# Patient Record
Sex: Female | Born: 1935
Health system: Southern US, Community
[De-identification: ages and names within clinical notes are randomized; demographics above are authoritative.]

## PROBLEM LIST (undated history)

## (undated) DIAGNOSIS — I341 Nonrheumatic mitral (valve) prolapse: Secondary | ICD-10-CM

## (undated) DIAGNOSIS — Z8639 Personal history of other endocrine, nutritional and metabolic disease: Secondary | ICD-10-CM

## (undated) DIAGNOSIS — Z8739 Personal history of other diseases of the musculoskeletal system and connective tissue: Secondary | ICD-10-CM

## (undated) DIAGNOSIS — M1990 Unspecified osteoarthritis, unspecified site: Secondary | ICD-10-CM

## (undated) DIAGNOSIS — J302 Other seasonal allergic rhinitis: Secondary | ICD-10-CM

## (undated) DIAGNOSIS — K5792 Diverticulitis of intestine, part unspecified, without perforation or abscess without bleeding: Secondary | ICD-10-CM

## (undated) DIAGNOSIS — R350 Frequency of micturition: Secondary | ICD-10-CM

## (undated) DIAGNOSIS — K219 Gastro-esophageal reflux disease without esophagitis: Secondary | ICD-10-CM

## (undated) DIAGNOSIS — B269 Mumps without complication: Secondary | ICD-10-CM

## (undated) DIAGNOSIS — M7989 Other specified soft tissue disorders: Secondary | ICD-10-CM

## (undated) DIAGNOSIS — N3941 Urge incontinence: Secondary | ICD-10-CM

## (undated) DIAGNOSIS — Z8619 Personal history of other infectious and parasitic diseases: Secondary | ICD-10-CM

## (undated) DIAGNOSIS — K5790 Diverticulosis of intestine, part unspecified, without perforation or abscess without bleeding: Secondary | ICD-10-CM

## (undated) DIAGNOSIS — N6002 Solitary cyst of left breast: Secondary | ICD-10-CM

## (undated) DIAGNOSIS — Z8679 Personal history of other diseases of the circulatory system: Secondary | ICD-10-CM

## (undated) DIAGNOSIS — D649 Anemia, unspecified: Secondary | ICD-10-CM

## (undated) DIAGNOSIS — IMO0002 Reserved for concepts with insufficient information to code with codable children: Secondary | ICD-10-CM

## (undated) DIAGNOSIS — H3530 Unspecified macular degeneration: Secondary | ICD-10-CM

## (undated) DIAGNOSIS — I82452 Acute embolism and thrombosis of left peroneal vein: Secondary | ICD-10-CM

## (undated) DIAGNOSIS — G459 Transient cerebral ischemic attack, unspecified: Secondary | ICD-10-CM

## (undated) DIAGNOSIS — R87619 Unspecified abnormal cytological findings in specimens from cervix uteri: Secondary | ICD-10-CM

## (undated) HISTORY — DX: Urge incontinence: N39.41

## (undated) HISTORY — DX: Unspecified abnormal cytological findings in specimens from cervix uteri: R87.619

## (undated) HISTORY — DX: Mumps without complication: B26.9

## (undated) HISTORY — DX: Gastro-esophageal reflux disease without esophagitis: K21.9

## (undated) HISTORY — DX: Reserved for concepts with insufficient information to code with codable children: IMO0002

## (undated) HISTORY — PX: ABDOMINAL HYSTERECTOMY: SHX81

## (undated) HISTORY — PX: COLONOSCOPY: SHX174

## (undated) HISTORY — DX: Transient cerebral ischemic attack, unspecified: G45.9

## (undated) HISTORY — DX: Personal history of other diseases of the circulatory system: Z86.79

## (undated) HISTORY — DX: Nonrheumatic mitral (valve) prolapse: I34.1

## (undated) HISTORY — DX: Personal history of other infectious and parasitic diseases: Z86.19

## (undated) HISTORY — DX: Frequency of micturition: R35.0

## (undated) HISTORY — DX: Personal history of other endocrine, nutritional and metabolic disease: Z86.39

## (undated) HISTORY — DX: Personal history of other diseases of the musculoskeletal system and connective tissue: Z87.39

---

## 1962-01-17 HISTORY — PX: BREAST CYST ASPIRATION: SHX578

## 1978-01-17 DIAGNOSIS — N6002 Solitary cyst of left breast: Secondary | ICD-10-CM

## 1978-01-17 HISTORY — DX: Solitary cyst of left breast: N60.02

## 1997-10-02 ENCOUNTER — Other Ambulatory Visit: Admission: RE | Admit: 1997-10-02 | Discharge: 1997-10-02 | Payer: Self-pay | Admitting: *Deleted

## 1998-03-09 ENCOUNTER — Ambulatory Visit (HOSPITAL_COMMUNITY): Admission: RE | Admit: 1998-03-09 | Discharge: 1998-03-09 | Payer: Self-pay | Admitting: Gastroenterology

## 1998-08-09 ENCOUNTER — Encounter: Payer: Self-pay | Admitting: Emergency Medicine

## 1998-08-09 ENCOUNTER — Emergency Department (HOSPITAL_COMMUNITY): Admission: EM | Admit: 1998-08-09 | Discharge: 1998-08-09 | Payer: Self-pay | Admitting: Emergency Medicine

## 1998-11-26 ENCOUNTER — Other Ambulatory Visit: Admission: RE | Admit: 1998-11-26 | Discharge: 1998-11-26 | Payer: Self-pay | Admitting: *Deleted

## 1999-10-04 ENCOUNTER — Other Ambulatory Visit: Admission: RE | Admit: 1999-10-04 | Discharge: 1999-10-04 | Payer: Self-pay | Admitting: Family Medicine

## 2000-01-04 ENCOUNTER — Other Ambulatory Visit: Admission: RE | Admit: 2000-01-04 | Discharge: 2000-01-04 | Payer: Self-pay | Admitting: *Deleted

## 2000-03-02 ENCOUNTER — Emergency Department (HOSPITAL_COMMUNITY): Admission: EM | Admit: 2000-03-02 | Discharge: 2000-03-02 | Payer: Self-pay | Admitting: Emergency Medicine

## 2000-03-02 ENCOUNTER — Encounter: Payer: Self-pay | Admitting: Emergency Medicine

## 2000-06-02 ENCOUNTER — Encounter: Admission: RE | Admit: 2000-06-02 | Discharge: 2000-06-02 | Payer: Self-pay | Admitting: Family Medicine

## 2000-06-02 ENCOUNTER — Encounter: Payer: Self-pay | Admitting: Family Medicine

## 2001-11-04 ENCOUNTER — Encounter: Payer: Self-pay | Admitting: Family Medicine

## 2001-11-04 ENCOUNTER — Emergency Department (HOSPITAL_COMMUNITY): Admission: EM | Admit: 2001-11-04 | Discharge: 2001-11-04 | Payer: Self-pay | Admitting: Emergency Medicine

## 2002-02-05 ENCOUNTER — Other Ambulatory Visit: Admission: RE | Admit: 2002-02-05 | Discharge: 2002-02-05 | Payer: Self-pay | Admitting: Obstetrics and Gynecology

## 2002-11-30 ENCOUNTER — Emergency Department (HOSPITAL_COMMUNITY): Admission: EM | Admit: 2002-11-30 | Discharge: 2002-11-30 | Payer: Self-pay | Admitting: Emergency Medicine

## 2003-01-02 ENCOUNTER — Encounter: Admission: RE | Admit: 2003-01-02 | Discharge: 2003-01-02 | Payer: Self-pay | Admitting: Family Medicine

## 2003-01-21 ENCOUNTER — Encounter: Admission: RE | Admit: 2003-01-21 | Discharge: 2003-01-21 | Payer: Self-pay | Admitting: Family Medicine

## 2003-02-09 ENCOUNTER — Emergency Department (HOSPITAL_COMMUNITY): Admission: EM | Admit: 2003-02-09 | Discharge: 2003-02-09 | Payer: Self-pay | Admitting: Emergency Medicine

## 2003-04-04 ENCOUNTER — Other Ambulatory Visit: Admission: RE | Admit: 2003-04-04 | Discharge: 2003-04-04 | Payer: Self-pay | Admitting: Obstetrics and Gynecology

## 2004-04-05 ENCOUNTER — Other Ambulatory Visit: Admission: RE | Admit: 2004-04-05 | Discharge: 2004-04-05 | Payer: Self-pay | Admitting: Obstetrics and Gynecology

## 2004-04-16 ENCOUNTER — Ambulatory Visit (HOSPITAL_COMMUNITY): Admission: RE | Admit: 2004-04-16 | Discharge: 2004-04-16 | Payer: Self-pay | Admitting: Obstetrics and Gynecology

## 2004-05-06 ENCOUNTER — Inpatient Hospital Stay (HOSPITAL_COMMUNITY): Admission: RE | Admit: 2004-05-06 | Discharge: 2004-05-08 | Payer: Self-pay | Admitting: Obstetrics and Gynecology

## 2004-09-06 ENCOUNTER — Encounter: Admission: RE | Admit: 2004-09-06 | Discharge: 2004-09-06 | Payer: Self-pay | Admitting: Orthopedic Surgery

## 2004-09-08 ENCOUNTER — Ambulatory Visit (HOSPITAL_COMMUNITY): Admission: RE | Admit: 2004-09-08 | Discharge: 2004-09-08 | Payer: Self-pay | Admitting: Orthopedic Surgery

## 2004-09-08 ENCOUNTER — Ambulatory Visit (HOSPITAL_BASED_OUTPATIENT_CLINIC_OR_DEPARTMENT_OTHER): Admission: RE | Admit: 2004-09-08 | Discharge: 2004-09-08 | Payer: Self-pay | Admitting: Orthopedic Surgery

## 2006-10-06 ENCOUNTER — Emergency Department (HOSPITAL_COMMUNITY): Admission: EM | Admit: 2006-10-06 | Discharge: 2006-10-07 | Payer: Self-pay | Admitting: Emergency Medicine

## 2007-02-01 ENCOUNTER — Emergency Department (HOSPITAL_COMMUNITY): Admission: EM | Admit: 2007-02-01 | Discharge: 2007-02-01 | Payer: Self-pay | Admitting: Emergency Medicine

## 2007-05-22 ENCOUNTER — Encounter: Admission: RE | Admit: 2007-05-22 | Discharge: 2007-05-22 | Payer: Self-pay | Admitting: Family Medicine

## 2007-07-30 ENCOUNTER — Emergency Department (HOSPITAL_COMMUNITY): Admission: EM | Admit: 2007-07-30 | Discharge: 2007-07-30 | Payer: Self-pay | Admitting: Emergency Medicine

## 2008-01-18 DIAGNOSIS — R350 Frequency of micturition: Secondary | ICD-10-CM

## 2008-01-18 HISTORY — DX: Frequency of micturition: R35.0

## 2008-05-06 ENCOUNTER — Emergency Department (HOSPITAL_COMMUNITY): Admission: EM | Admit: 2008-05-06 | Discharge: 2008-05-06 | Payer: Self-pay | Admitting: Emergency Medicine

## 2008-06-02 ENCOUNTER — Encounter: Admission: RE | Admit: 2008-06-02 | Discharge: 2008-06-02 | Payer: Self-pay | Admitting: Family Medicine

## 2008-11-18 ENCOUNTER — Encounter: Admission: RE | Admit: 2008-11-18 | Discharge: 2008-11-18 | Payer: Self-pay | Admitting: Family Medicine

## 2009-09-22 ENCOUNTER — Encounter: Admission: RE | Admit: 2009-09-22 | Discharge: 2009-09-22 | Payer: Self-pay | Admitting: Obstetrics and Gynecology

## 2009-09-25 ENCOUNTER — Emergency Department (HOSPITAL_COMMUNITY): Admission: EM | Admit: 2009-09-25 | Discharge: 2009-09-26 | Payer: Self-pay | Admitting: Emergency Medicine

## 2010-01-17 DIAGNOSIS — IMO0002 Reserved for concepts with insufficient information to code with codable children: Secondary | ICD-10-CM

## 2010-01-17 DIAGNOSIS — N3941 Urge incontinence: Secondary | ICD-10-CM

## 2010-01-17 HISTORY — DX: Urge incontinence: N39.41

## 2010-01-17 HISTORY — DX: Reserved for concepts with insufficient information to code with codable children: IMO0002

## 2010-04-01 LAB — CBC
HCT: 34.8 % — ABNORMAL LOW (ref 36.0–46.0)
Hemoglobin: 12.4 g/dL (ref 12.0–15.0)
MCH: 31.4 pg (ref 26.0–34.0)
MCHC: 35.7 g/dL (ref 30.0–36.0)
MCV: 87.9 fL (ref 78.0–100.0)
Platelets: 197 10*3/uL (ref 150–400)
RBC: 3.96 MIL/uL (ref 3.87–5.11)
RDW: 13.6 % (ref 11.5–15.5)
WBC: 6.9 10*3/uL (ref 4.0–10.5)

## 2010-04-01 LAB — DIFFERENTIAL
Basophils Absolute: 0 10*3/uL (ref 0.0–0.1)
Basophils Relative: 0 % (ref 0–1)
Eosinophils Absolute: 0.2 10*3/uL (ref 0.0–0.7)
Eosinophils Relative: 4 % (ref 0–5)
Lymphocytes Relative: 40 % (ref 12–46)
Lymphs Abs: 2.8 10*3/uL (ref 0.7–4.0)
Monocytes Absolute: 0.7 10*3/uL (ref 0.1–1.0)
Monocytes Relative: 11 % (ref 3–12)
Neutro Abs: 3.2 10*3/uL (ref 1.7–7.7)
Neutrophils Relative %: 46 % (ref 43–77)

## 2010-04-21 ENCOUNTER — Emergency Department (HOSPITAL_COMMUNITY): Payer: Medicare Other

## 2010-04-21 ENCOUNTER — Emergency Department (HOSPITAL_COMMUNITY)
Admission: EM | Admit: 2010-04-21 | Discharge: 2010-04-21 | Disposition: A | Discharge status: Home / Self Care | Payer: Medicare Other | Attending: Emergency Medicine | Admitting: Emergency Medicine

## 2010-04-21 DIAGNOSIS — J40 Bronchitis, not specified as acute or chronic: Secondary | ICD-10-CM | POA: Insufficient documentation

## 2010-04-21 DIAGNOSIS — R0609 Other forms of dyspnea: Secondary | ICD-10-CM | POA: Insufficient documentation

## 2010-04-21 DIAGNOSIS — R0602 Shortness of breath: Secondary | ICD-10-CM | POA: Insufficient documentation

## 2010-04-21 DIAGNOSIS — R079 Chest pain, unspecified: Secondary | ICD-10-CM | POA: Insufficient documentation

## 2010-04-21 DIAGNOSIS — F411 Generalized anxiety disorder: Secondary | ICD-10-CM | POA: Insufficient documentation

## 2010-04-21 DIAGNOSIS — R0989 Other specified symptoms and signs involving the circulatory and respiratory systems: Secondary | ICD-10-CM | POA: Insufficient documentation

## 2010-04-21 LAB — BASIC METABOLIC PANEL
BUN: 22 mg/dL (ref 6–23)
CO2: 24 mEq/L (ref 19–32)
Calcium: 9.1 mg/dL (ref 8.4–10.5)
Chloride: 105 mEq/L (ref 96–112)
Creatinine, Ser: 1.08 mg/dL (ref 0.4–1.2)
GFR calc Af Amer: 59 mL/min — ABNORMAL LOW (ref >60)
GFR calc non Af Amer: 49 mL/min — ABNORMAL LOW (ref >60)
Glucose, Bld: 118 mg/dL — ABNORMAL HIGH (ref 70–99)
Potassium: 4.3 mEq/L (ref 3.5–5.1)
Sodium: 136 mEq/L (ref 135–145)

## 2010-04-21 LAB — DIFFERENTIAL
Basophils Absolute: 0 10*3/uL (ref 0.0–0.1)
Basophils Relative: 0 % (ref 0–1)
Eosinophils Absolute: 0.1 10*3/uL (ref 0.0–0.7)
Eosinophils Relative: 1 % (ref 0–5)
Lymphocytes Relative: 58 % — ABNORMAL HIGH (ref 12–46)
Lymphs Abs: 2.9 10*3/uL (ref 0.7–4.0)
Monocytes Absolute: 0.6 10*3/uL (ref 0.1–1.0)
Monocytes Relative: 12 % (ref 3–12)
Neutro Abs: 1.4 10*3/uL — ABNORMAL LOW (ref 1.7–7.7)
Neutrophils Relative %: 29 % — ABNORMAL LOW (ref 43–77)

## 2010-04-21 LAB — POCT CARDIAC MARKERS
CKMB, poc: <1 ng/mL — ABNORMAL LOW (ref 1.0–8.0)
Myoglobin, poc: 220 ng/mL (ref 12–200)
Troponin i, poc: <0.05 ng/mL (ref 0.00–0.09)

## 2010-04-21 LAB — CBC
HCT: 35 % — ABNORMAL LOW (ref 36.0–46.0)
Hemoglobin: 12.4 g/dL (ref 12.0–15.0)
MCH: 29.5 pg (ref 26.0–34.0)
MCHC: 35.4 g/dL (ref 30.0–36.0)
MCV: 83.3 fL (ref 78.0–100.0)
Platelets: 168 10*3/uL (ref 150–400)
RBC: 4.2 MIL/uL (ref 3.87–5.11)
RDW: 13.3 % (ref 11.5–15.5)
WBC: 5 10*3/uL (ref 4.0–10.5)

## 2010-04-21 LAB — D-DIMER, QUANTITATIVE (NOT AT ARMC): D-Dimer, Quant: 0.33 ug/mL-FEU (ref 0.00–0.48)

## 2010-05-03 ENCOUNTER — Encounter: Payer: Self-pay | Admitting: Pulmonary Disease

## 2010-05-04 ENCOUNTER — Ambulatory Visit: Payer: BC Managed Care – PPO | Admitting: Pulmonary Disease

## 2010-05-05 ENCOUNTER — Institutional Professional Consult (permissible substitution): Payer: BC Managed Care – PPO | Admitting: Internal Medicine

## 2010-06-04 NOTE — Op Note (Signed)
[name], [name]                 ACCOUNT NO.:  [id number]   MEDICAL RECORD NO.:  [id number]          PATIENT TYPE:  AMB   LOCATION:  DSC                          FACILITY:  [facility]   PHYSICIAN:  Mila Homer. Sherlean Foot, M.D. DATE OF BIRTH:  1927-07-05   DATE OF PROCEDURE:  09/08/2004  DATE OF DISCHARGE:                                 OPERATIVE REPORT   SURGEON:  Lucey.   ASSISTANT:  Rexene Edison, P.A.   ANESTHESIA:  General.   PREOPERATIVE DIAGNOSES:  1.  Right shoulder rotator cuff tear and impingement syndrome.  2.  Acromioclavicular joint arthritis.   POSTOPERATIVE DIAGNOSES:  1.  Right shoulder rotator cuff tear and impingement syndrome.  2.  Acromioclavicular joint arthritis.   PROCEDURE:  1.  Right mini open rotator cuff repair.  2.  Subacromial decompression.  3.  Shoulder arthroscopy with debridement of labral tearing and distal      clavicle resection.   Informed consent was obtained.   DESCRIPTION OF PROCEDURE:  Patient was laid supine, administered general  anesthesia after preoperative interscalene block.  The patient was then  placed in the beach chair position, the right shoulder was prepped and  draped in the usual sterile fashion.  Anterior, posterior and direct caudal  portals were created with a #11 blade, blunt trocar and cannula.  Diagnostic  arthroscopy revealed degenerative labral tearing and undersurface rotator  cuff tear.  A small 35 gator shaver was used for the anterior portal to  perform a debridement of the labrum.  There was minimal chondromalacia in  the joint and the biceps tendon looked good.  I then debrided the  undersurface of the rotator cuff.  I then went from the posterior portal  into the subacromial space.  From the direct lateral portal, I performed a  bursectomy with the 35 gator shaver.  I then released the CA ligament with a  90 degree ArthroCare debridement wand.  I completed the bursectomy and  cleaned off the undersurface of the  acromion.  There was definitely a type 3  acromion a large __________ [facility] joint undersurface creating impingement.  I  used the 4.0 mm cylindrical bur to perform an aggressive anterolateral  acromioplasty as well as a distal clavicle resection.  I then converted to a  mini open technique.  I extended my lateral incision up to 2 cm in length.  I increased the length of the portal through the muscle, in line with the  raphe, and held it in place with the Arthrex shoulder retractor.  I then  used the 4.0 mm cylindrical bur to create a bony bleeding bed in the  undersurface of the tear and placed a single 5.5 mm bicortical screw from  Arthrex.  I then placed a modified Mason-Allen suture to bring this back  together.  It came together very, very nicely.  I then irrigated and closed  with interrupted 0 and 2-0 Vicryl sutures, dressed with Adaptic, 4x4s, ABDs,  2 inch silk tape and a sling and swathe.   COMPLICATIONS:  None.   DRAINS:  None.  ______________________________  Mila Homer Sherlean Foot, M.D.     SDL/MEDQ  D:  09/08/2004  T:  09/08/2004  Job:  [id number]

## 2010-06-04 NOTE — H&P (Signed)
[name], Thornton                 ACCOUNT NO.:  [id number]   MEDICAL RECORD NO.:  [id number]          PATIENT TYPE:  INP   LOCATION:  NA                            [facility]AC[facility]  [facility]   PHYSICIAN:  Osborn Coho, M.D.   DATE OF BIRTH:  1927/02/05   DATE OF ADMISSION:  05/06/2004  DATE OF DISCHARGE:                                HISTORY & PHYSICAL   CHIEF COMPLAINT:  Bulge in vagina.   HISTORY OF PRESENT ILLNESS:  Ms. [name] is a 77 year old postmenopausal  female, status post hysterectomy in 1977 with vaginal wall prolapse x10 to  20 years with mixed urinary incontinence predominantly urge incontinence.  The patient would like surgical management of her vaginal wall prolapse with  an anterior and posterior repair.  She declined a TVT secondary to minor  stress incontinence symptoms.  The risks, benefits, and alternatives of an  anterior and posterior repair discussed with the patient including, but not  limited to bleeding, infection, injury.  The patient verbalized  understanding, questions answered, and consent signed and witnessed the day  of the procedure.   PAST OBSTETRICAL HISTORY:  Normal spontaneous vaginal delivery full term x3,  no problems.   PAST GYN HISTORY:  Status post hysterectomy in 1977, no history of abnormal  Pap smears.  Last mammogram in February of 2006 which was negative.  Denies  a history of gonorrhea and Chlamydia.  Reports a colonoscopy in 2004 which  was negative.   PAST SURGICAL HISTORY:  Hysterectomy as mentioned above, breast cystectomy  and anterior repair 10 to 15 years ago.   PAST MEDICAL HISTORY:  Denies hypertension, diabetes.  Reports a history of  GERD and hiatal hernia.   MEDICATIONS:  1.  Levbid.  2.  Nexium.   ALLERGIES:  No known drug allergies.   SOCIAL HISTORY:  Denies cigarette use, alcohol use, and drug use.  She lives  with her husband.   FAMILY HISTORY:  Mother with uterine cancer and no other GYN cancers.  Diabetes in an  aunt.  Father died of unknown cause.  Brother with heart  problems and a CABG in another brother.   REVIEW OF SYSTEMS:  Denies fevers, chills, nausea, vomiting, diarrhea.  She  denies chest pain or shortness of breath with activity.  Reports the history  of hiatal hernia and reports walking 2 miles everyday.   PHYSICAL EXAMINATION:  VITAL SIGNS:  Blood pressure at last visit in office  130/70.  HEENT:  Within normal limits.  Thyroid not enlarged.  HEART:  Rate and rhythm are regular.  CHEST:  Clear to auscultation bilaterally.  BREASTS:  No masses, discharge, skin changes, or nipple retraction.  BACK:  No CVA tenderness.  ABDOMEN:  Soft and nontender.  EXTREMITIES:  Within normal limits.  PELVIC:  External genitalia within normal limits.  Vagina with stage 3  cystocele and stage 4 rectocele.  The patient is status post hysterectomy.  No palpable adnexal masses or rectal masses.   ASSESSMENT:  Symptomatic cystocele for surgical management with anterior and  posterior repair scheduled for May 06, 2004.  The risks, benefits, and  alternatives have been discussed with the patient as mentioned above.  Preoperative labs to be done prior to surgery.      AR/MEDQ  D:  05/06/2004  T:  05/06/2004  Job:  [id number]

## 2010-06-04 NOTE — Discharge Summary (Signed)
NAMEBRAIDEN, [name]                 ACCOUNT NO.:  [id number]   MEDICAL RECORD NO.:  [id number]          PATIENT TYPE:  INP   LOCATION:  9320                          FACILITY:  [facility]   PHYSICIAN:  Osborn Coho, M.D.   DATE OF BIRTH:  [birth year]   DATE OF ADMISSION:  05/06/2004  DATE OF DISCHARGE:  05/08/2004                                 DISCHARGE SUMMARY   DISCHARGE DIAGNOSES:  1.  Symptomatic pelvic relaxation.  2.  Cystocele.  3.  Rectocele.   OPERATION:  On the date of admission, the patient underwent an anterior and  posterior colporrhaphy, tolerating the procedure well.   HISTORY OF PRESENT ILLNESS:  [name] is a 77 year old postmenopausal  female who is status post hysterectomy who presents for an anterior with  posterior repair because of vaginal wall prolapse and symptoms of mixed  urinary incontinence. Please see the patient's dictated History and Physical  Examination for details.   PREOPERATIVE PHYSICAL EXAMINATION:  VITAL SIGNS:  Blood pressure 130/70.  GENERAL:  Within normal limits.  PELVIC:  External genitalia within normal limits. Vagina with stage 3  cystocele and stage 4 rectocele (the patient is status post hysterectomy).  There are no palpable masses or rectal masses.   HOSPITAL COURSE:  On the date of admission the patient underwent  aforementioned procedure, tolerating it well. Postoperative course was  marked by the patient experiencing a fever of 101.2 degrees Fahrenheit  orally; however, she quickly defervesced and maintained a normal temperature  for 24 hours prior to her discharge. On postoperative day #2, the patient  had resumed bowel and bladder function and was therefore deemed ready for  discharge home. Discharge hemoglobin 10.4 (preoperative hemoglobin was  12.5).   DISCHARGE MEDICATIONS:  1.  Colace 100 mg one tablet two to three times daily for 6 weeks.  2.  Motrin 600 mg one tablet q.6h. as needed for pain.  3.  Tylenol No. 3 one to  two tablets q.4-6h. as needed for breakthrough      pain.   The patient was also advised to resume her prehospital medications:  1.  Levbid 0.375 mg one tablet daily.  2.  Nexium 40 mg daily.  3.  Calcium 600 mg daily.   FOLLOW-UP:  The patient is scheduled for a follow-up visit with Dr. Su Hilt  on Jun 16, 2004 at 10:30 a.m.   DISCHARGE INSTRUCTIONS:  The patient was given a copy of [facility] postoperative instruction sheet. She was further advised to call her  doctor with any temperature greater than or equal to 100.4, fever, chills,  vomiting, vaginal bleeding, or abdominal pain. The patient was further  advised to not place anything in her vagina for 6 weeks. The patient's diet  was without restriction.      EJP/MEDQ  D:  05/10/2004  T:  05/11/2004  Job:  [id number]

## 2010-06-04 NOTE — Op Note (Signed)
NAMEELENE, DOWNUM                 ACCOUNT NO.:  [id number]   MEDICAL RECORD NO.:  [id number]          PATIENT TYPE:  INP   LOCATION:  9320                          FACILITY:  [facility]   PHYSICIAN:  Osborn Coho, M.D.   DATE OF BIRTH:  [birth year]   DATE OF PROCEDURE:  05/06/2004  DATE OF DISCHARGE:  05/08/2004                                 OPERATIVE REPORT   PREOPERATIVE DIAGNOSIS:  Symptomatic cystocele and rectocele.   POSTOPERATIVE DIAGNOSIS:  Symptomatic cystocele and rectocele.   PROCEDURE:  Anterior and posterior repair.   ATTENDING:  Osborn Coho, M.D.   ASSISTANT:  Marquis Lunch. Adline Peals.   ANESTHESIA:  General.   No specimens to pathology.   FLUIDS:  1500 mL.   URINE OUTPUT:  400 mL.   ESTIMATED BLOOD LOSS:  Minimal.   COMPLICATIONS:  None.   PROCEDURE:  The patient was taken to the operating room after the risks,  benefits, and alternatives were discussed with the patient, the patient  verbalized understanding and consent signed and witnessed.  The patient was  placed under a general per anesthesia and prepped and draped in the usual  sterile fashion in the dorsal lithotomy position.  Vaginal wall retractors  were used for visualization of the rectocele.  Dilute Pitressin was  administered in the posterior vaginal wall.  The vaginal mucosa was  undermined and dissected away from the underlying fascia.  The rectocele was  repaired with 0 Vicryl via plication stitches, allowing reinforcement of the  underlying fascia.  The excess vaginal mucosa was excised and the posterior  vaginal wall repaired using interrupteds of 2-0 Vicryl.  Attention was then  turned to the cystocele and dilute Pitressin was administered.  The anterior  vaginal wall was then incised and the vaginal mucosa undermined from the  underlying tissue.  The cystocele was repaired with 2-0 chromic using a  pursestring stitch.  The vaginal wall was then repaired with 2-0 Vicryl via  interrupteds.   At the time  of repairing the rectocele, a small enterocele was noted and repaired with a  pursestring stitch of 2-0 chromic.  Estrogen-soaked one-inch plain vaginal  packing was placed.  Sponge, lap and needle count was correct.  The patient  tolerated the procedure well and was returned to the recovery room in good  condition.      AR/MEDQ  D:  05/17/2004  T:  05/17/2004  Job:  [id number]

## 2010-09-29 ENCOUNTER — Emergency Department (HOSPITAL_COMMUNITY): Payer: Medicare Other

## 2010-09-29 ENCOUNTER — Other Ambulatory Visit (HOSPITAL_COMMUNITY): Payer: BC Managed Care – PPO

## 2010-09-29 ENCOUNTER — Inpatient Hospital Stay (HOSPITAL_COMMUNITY)
Admission: EM | Admit: 2010-09-29 | Discharge: 2010-10-01 | DRG: 204 | Disposition: A | Discharge status: Home / Self Care | Payer: Medicare Other | Attending: Internal Medicine | Admitting: Internal Medicine

## 2010-09-29 DIAGNOSIS — R0789 Other chest pain: Secondary | ICD-10-CM | POA: Diagnosis present

## 2010-09-29 DIAGNOSIS — R002 Palpitations: Secondary | ICD-10-CM | POA: Diagnosis present

## 2010-09-29 DIAGNOSIS — Z79899 Other long term (current) drug therapy: Secondary | ICD-10-CM

## 2010-09-29 DIAGNOSIS — R609 Edema, unspecified: Secondary | ICD-10-CM | POA: Diagnosis present

## 2010-09-29 DIAGNOSIS — Z91041 Radiographic dye allergy status: Secondary | ICD-10-CM

## 2010-09-29 DIAGNOSIS — R0602 Shortness of breath: Principal | ICD-10-CM | POA: Diagnosis present

## 2010-09-29 LAB — D-DIMER, QUANTITATIVE (NOT AT ARMC): D-Dimer, Quant: <0.22 ug/mL-FEU (ref 0.00–0.48)

## 2010-09-29 LAB — BASIC METABOLIC PANEL
BUN: 17 mg/dL (ref 6–23)
CO2: 21 mEq/L (ref 19–32)
Calcium: 10.2 mg/dL (ref 8.4–10.5)
Chloride: 104 mEq/L (ref 96–112)
Creatinine, Ser: 0.89 mg/dL (ref 0.50–1.10)
GFR calc Af Amer: >60 mL/min (ref >60)
GFR calc non Af Amer: >60 mL/min (ref >60)
Glucose, Bld: 189 mg/dL — ABNORMAL HIGH (ref 70–99)
Potassium: 3.7 mEq/L (ref 3.5–5.1)
Sodium: 139 mEq/L (ref 135–145)

## 2010-09-29 LAB — CBC
HCT: 35.5 % — ABNORMAL LOW (ref 36.0–46.0)
Hemoglobin: 12.8 g/dL (ref 12.0–15.0)
MCH: 29.8 pg (ref 26.0–34.0)
MCHC: 36.1 g/dL — ABNORMAL HIGH (ref 30.0–36.0)
MCV: 82.6 fL (ref 78.0–100.0)
Platelets: 196 10*3/uL (ref 150–400)
RBC: 4.3 MIL/uL (ref 3.87–5.11)
RDW: 13 % (ref 11.5–15.5)
WBC: 6.5 10*3/uL (ref 4.0–10.5)

## 2010-09-29 LAB — CARDIAC PANEL(CRET KIN+CKTOT+MB+TROPI)
CK, MB: 2.5 ng/mL (ref 0.3–4.0)
Relative Index: INVALID (ref 0.0–2.5)
Total CK: 78 U/L (ref 7–177)
Troponin I: <0.3 ng/mL (ref <0.30)

## 2010-09-29 LAB — HEPATIC FUNCTION PANEL
ALT: 12 U/L (ref 0–35)
AST: 16 U/L (ref 0–37)
Albumin: 3.8 g/dL (ref 3.5–5.2)
Alkaline Phosphatase: 60 U/L (ref 39–117)
Bilirubin, Direct: 0.1 mg/dL (ref 0.0–0.3)
Indirect Bilirubin: 0.3 mg/dL (ref 0.3–0.9)
Total Bilirubin: 0.4 mg/dL (ref 0.3–1.2)
Total Protein: 7.3 g/dL (ref 6.0–8.3)

## 2010-09-29 LAB — POCT I-STAT TROPONIN I
Troponin i, poc: 0.01 ng/mL (ref 0.00–0.08)
Troponin i, poc: 0.03 ng/mL (ref 0.00–0.08)

## 2010-09-29 LAB — TSH: TSH: 1.356 u[IU]/mL (ref 0.350–4.500)

## 2010-09-29 LAB — VITAMIN B12: Vitamin B-12: 916 pg/mL — ABNORMAL HIGH (ref 211–911)

## 2010-09-29 LAB — CK TOTAL AND CKMB (NOT AT ARMC)
CK, MB: 2.8 ng/mL (ref 0.3–4.0)
Relative Index: INVALID (ref 0.0–2.5)
Total CK: 92 U/L (ref 7–177)

## 2010-09-29 LAB — PRO B NATRIURETIC PEPTIDE: Pro B Natriuretic peptide (BNP): 95.6 pg/mL (ref 0–450)

## 2010-09-29 MED ORDER — XENON XE 133 GAS
5.3000 | GAS_FOR_INHALATION | Freq: Once | RESPIRATORY_TRACT | Status: AC | PRN
  Administered 2010-09-29: 5 via RESPIRATORY_TRACT

## 2010-09-29 MED ORDER — TECHNETIUM TO 99M ALBUMIN AGGREGATED
7.4000 | Freq: Once | INTRAVENOUS | Status: AC | PRN
  Administered 2010-09-29: 7 via INTRAVENOUS

## 2010-09-30 DIAGNOSIS — R0602 Shortness of breath: Secondary | ICD-10-CM

## 2010-09-30 DIAGNOSIS — M7989 Other specified soft tissue disorders: Secondary | ICD-10-CM

## 2010-09-30 LAB — COMPREHENSIVE METABOLIC PANEL
ALT: 10 U/L (ref 0–35)
AST: 15 U/L (ref 0–37)
Albumin: 3.2 g/dL — ABNORMAL LOW (ref 3.5–5.2)
Alkaline Phosphatase: 49 U/L (ref 39–117)
BUN: 18 mg/dL (ref 6–23)
CO2: 28 mEq/L (ref 19–32)
Calcium: 8.9 mg/dL (ref 8.4–10.5)
Chloride: 107 mEq/L (ref 96–112)
Creatinine, Ser: 0.95 mg/dL (ref 0.50–1.10)
GFR calc Af Amer: >60 mL/min (ref >60)
GFR calc non Af Amer: 56 mL/min — ABNORMAL LOW (ref >60)
Glucose, Bld: 87 mg/dL (ref 70–99)
Potassium: 4.2 mEq/L (ref 3.5–5.1)
Sodium: 142 mEq/L (ref 135–145)
Total Bilirubin: 0.5 mg/dL (ref 0.3–1.2)
Total Protein: 6.2 g/dL (ref 6.0–8.3)

## 2010-09-30 LAB — CBC
HCT: 31.5 % — ABNORMAL LOW (ref 36.0–46.0)
Hemoglobin: 11.2 g/dL — ABNORMAL LOW (ref 12.0–15.0)
MCH: 29.8 pg (ref 26.0–34.0)
MCHC: 35.6 g/dL (ref 30.0–36.0)
MCV: 83.8 fL (ref 78.0–100.0)
Platelets: 162 10*3/uL (ref 150–400)
RBC: 3.76 MIL/uL — ABNORMAL LOW (ref 3.87–5.11)
RDW: 13.1 % (ref 11.5–15.5)
WBC: 7.3 10*3/uL (ref 4.0–10.5)

## 2010-09-30 LAB — URINALYSIS, ROUTINE W REFLEX MICROSCOPIC
Bilirubin Urine: NEGATIVE
Glucose, UA: NEGATIVE mg/dL
Hgb urine dipstick: NEGATIVE
Ketones, ur: NEGATIVE mg/dL
Leukocytes, UA: NEGATIVE
Nitrite: NEGATIVE
Protein, ur: NEGATIVE mg/dL
Specific Gravity, Urine: 1.013 (ref 1.005–1.030)
Urobilinogen, UA: 0.2 mg/dL (ref 0.0–1.0)
pH: 7.5 (ref 5.0–8.0)

## 2010-09-30 LAB — LIPID PANEL
Cholesterol: 170 mg/dL (ref 0–200)
HDL: 48 mg/dL (ref >39)
LDL Cholesterol: 102 mg/dL — ABNORMAL HIGH (ref 0–99)
Total CHOL/HDL Ratio: 3.5 RATIO
Triglycerides: 99 mg/dL (ref <150)
VLDL: 20 mg/dL (ref 0–40)

## 2010-09-30 LAB — CARDIAC PANEL(CRET KIN+CKTOT+MB+TROPI)
CK, MB: 2.4 ng/mL (ref 0.3–4.0)
CK, MB: 2.5 ng/mL (ref 0.3–4.0)
Relative Index: INVALID (ref 0.0–2.5)
Relative Index: INVALID (ref 0.0–2.5)
Total CK: 68 U/L (ref 7–177)
Total CK: 85 U/L (ref 7–177)
Troponin I: <0.3 ng/mL (ref <0.30)
Troponin I: <0.3 ng/mL (ref <0.30)

## 2010-10-01 NOTE — H&P (Signed)
[name], [name]                 ACCOUNT NO.:  [id number]  MEDICAL RECORD NO.:  [id number]  LOCATION:  WLED                         FACILITY:  [facility]  PHYSICIAN:  Marinda Elk, M.D.DATE OF BIRTH:  October 07, 1927  DATE OF ADMISSION:  09/29/2010 DATE OF DISCHARGE:                             HISTORY & PHYSICAL   PRIMARY CARE DOCTOR:  Dr. Chilton Si  CHIEF COMPLAINT:  Shortness of breath, palpitation, chest discomfort.  This is an 77 year old female with no significant past medical history who comes in for chest discomfort.  The patient relates that 2 weeks ago, she traveled to [place] literally by car and her leg was swollen at that time.  She did not have any symptoms at that time, so she came back driving here to [place], and was feeling okay.  Her leg got swelled up, but this resolved after a couple of days, she did not pay any attention.  She comes to the ED today because on the day of admission, she started having chest pressure this morning.  Felt some palpitations.  Felt like she was going to pass out, but never lost consciousness and started feeling short of breath.  She has some trouble getting up the stairs and she was significantly short of breath when she went and got her son.  So, they decided to bring her to the ED.  She relates that nothing makes it better or worse.  She denies any diarrhea, chills, nausea, or vomiting.  So, we were asked to admit and further evaluate.  ALLERGIES:  CONTRAST MEDIA, she gets a rash.  PAST MEDICAL HISTORY:  Only significant for cystocele and rectocele.  MEDICATIONS:  Only on Nexium.  SOCIAL HISTORY:  She lives here in [place].  Denies alcohol, tobacco, or drug and never has smoked.  FAMILY HISTORY:  She admits her father died in his sleep when he was 60. Her mother has uterine cancer.  No family history of clot disorder.  REVIEW OF SYSTEMS:  10-point review of system done, pertinent positive per HPI.  PHYSICAL EXAMINATION:   VITAL SIGNS:  Temperature 98, pulse 79, blood pressure 160/123, she was saturating 100% on room air, breathing 20 times per minute. GENERAL:  She is awake, alert, and oriented x3. HEENT:  Moist mucous membrane.  No JVD.  No bruits.  No thyromegaly. Anicteric.  No pallor. CARDIOVASCULAR:  She has regular rate and rhythm with positive S1 and S2.  No murmurs, rubs, or gallops. LUNGS:  Good air movement, clear to auscultation. ABDOMEN:  Positive bowel sounds, nontender, nondistended, soft. EXTREMITIES:  Positive pulses.  No clubbing.  No edema on the right, but the left leg looks a little bit slightly swollen, but no tenderness. Hoffman sign negative. SKIN:  She has no rashes or ulceration. NEURO:  Completely nonfocal.  LABORATORY DATA:  On admission show a D-dimer of less than 0.22.  Her proBNP was 95.  Her sodium was 139, potassium 3.7, chloride 104, bicarbonate 21, glucose of 189, BUN of 17, creatinine 0.9, calcium of 10.2.  Her white count of 6.5, hemoglobin of 12.8, platelet count of 196.  Her first set of cardiac enzymes negative x1.  Chest x-ray  shows no acute cardiopulmonary disease and her EKG shows sinus rhythm.  ASSESSMENT AND PLAN:  Chest pain, palpitation, and shortness of breath in an 77 year old with no risk factors.  We will admit to Telemetry to rule out any kind of arrhythmias.  We will cycle her cardiac enzymes x3. We will check a TSH, fasting lipid panel, B12, and a 2-D echo.  She has significant risk factors for pulmonary embolism.  Her leg was swollen when she traveled to [place] by car.  She is complaining of shortness of breath and palpitation that puts her at a moderate risk for pulmonary embolism, although her D-dimer was negative.  D-dimer is only useful when there is low probability for pulmonary embolism which rules out a pulmonary embolism, but as she is moderate to high risk, we will get a V/Q scan and Doppler ultrasound of her lower extremities to rule  out pulmonary embolism, deep vein thrombosis.  Also pericarditis and also in the differential, but history is not compatible.  Her EKG shows no changes and she has no pneumonia.  No dissection or widening mediastinum on chest x-ray.     Marinda Elk, M.D.     AF/MEDQ  D:  09/29/2010  T:  09/29/2010  Job:  [id number]  Electronically Signed by Marinda Elk M.D. on 10/01/2010 05:37:45 PM

## 2010-10-02 LAB — URINE CULTURE
Colony Count: 50000
Culture  Setup Time: 201209140012
Special Requests: NEGATIVE

## 2010-10-14 LAB — POCT I-STAT, CHEM 8
BUN: 13
Calcium, Ion: 1.23
Chloride: 109
Creatinine, Ser: 0.9
Glucose, Bld: 89
HCT: 39
Hemoglobin: 13.3
Potassium: 3.7
Sodium: 142
TCO2: 19

## 2010-10-14 LAB — OCCULT BLOOD X 1 CARD TO LAB, STOOL: Fecal Occult Bld: NEGATIVE

## 2010-10-16 NOTE — Discharge Summary (Signed)
[name], [name]                 ACCOUNT NO.:  [id number]  MEDICAL RECORD NO.:  [id number]  LOCATION:  1437                         FACILITY:  [facility]  PHYSICIAN:  Osvaldo Shipper, MD     DATE OF BIRTH:  04-May-1927  DATE OF ADMISSION:  09/29/2010 DATE OF DISCHARGE:  10/01/2010                              DISCHARGE SUMMARY   PRIMARY CARE PHYSICIAN:  Dr. Kirby Thornton.  No consultations obtained during this admission.  Imaging studies done include, 1. Chest x-ray, which showed no active lung disease. 2. V/Q scan, which showed very low probability of pulmonary embolism. 3. Doppler study of the lower extremities did not show any DVTs. 4. An echocardiogram was done, which showed EF of 60% to 65% with     normal wall motion and no wall motion abnormalities.  No     significant valvular disease was noted on this echocardiogram.  Pertinent laboratory data during this hospitalization include normal D- dimer, initial glucose of 189, subsequently was 87.  Cardiac enzymes are negative.  BNP was 95.  Rest of the labs were all unremarkable.  B12 was 916.  TSH was 1.35.  DISCHARGE DIAGNOSES: 1. Chest discomfort, shortness of breath, etiology unclear. 2. Lower extremity edema, resolved spontaneously.  No Deep venous     thrombosis. 3. Mild anemia, requiring outpatient followup.  BRIEF HOSPITAL COURSE:  Briefly this is an 77 year old African American female who was in good health who came in with complaints of shortness of breath, chest discomfort, and palpitations.  There was a recent road trip as well and her legs were swollen.  She underwent workup for thromboembolism and all that has come back negative.  Echocardiogram was unremarkable.  Cardiac enzymes were negative.  Symptoms have resolved. She has been ambulating in the hallway with no difficulties.  So her etiology is unclear.  It looks like the patient has been having these issues for a while.  She reported a stress test, which was  done about 6 months ago, which she reported being negative.  At this time, I recommended to the patient to follow up with her primary care physician within the next 1 to 2 weeks to discuss these issues further.  She otherwise, does not have any significant chronic medical problems.  On the day of discharge, the patient is feeling well, pleased about going home.  Denies any complaints.  Chest pain has resolved.  PHYSICAL EXAMINATION:  VITAL SIGNS:  All stable.  Blood pressure is 124/72. LUNGS:  Clear to auscultation. CARDIOVASCULAR SYSTEM:  S1 and S3 is normal and regular.  No S3, S4, rubs, murmurs, or bruit. ABDOMEN:  Soft, nontender, and nondistended.  Bowel sounds are present. No masses or organomegaly appreciated. EXTREMITIES:  No pedal edema is noted. NEUROLOGICAL:  She is alert.  No focal neurological deficits are present.  Essentially, the patient is stable for discharge.  Discharge medications include the following: 1. Advil 200 mg 2 tablets every 4 to 6 hours as needed for pain. 2. Centrum multivitamins 1 tablet daily.3. Citracal 1 tablet daily. 4. Eye vitamin 1 tablet daily. 5. Omega 3, 1 capsule daily. 6. Probiotic 1 capsule daily. 7. Vitamin D  1 tablet daily.  Follow up with Dr. Valentina Thornton, her PCP in 1 to 2 weeks.  DIET:  Regular diet.  Physical activity as tolerated, although I told the patient not to exert herself.  Total time on this discharge encountered 35 minutes.  Osvaldo Shipper, MD     GK/MEDQ  D:  10/01/2010  T:  10/01/2010  Job:  [id number]  cc:   [name], M.D. Fax: [phone]  Electronically Signed by Osvaldo Shipper MD on 10/16/2010 07:19:26 PM

## 2010-10-28 LAB — CBC
HCT: 38
Hemoglobin: 13
MCHC: 34.3
MCV: 87.5
Platelets: 229
RBC: 4.34
RDW: 13.7
WBC: 7.3

## 2010-10-28 LAB — DIFFERENTIAL
Basophils Absolute: 0
Basophils Relative: 1
Eosinophils Absolute: 0.1
Eosinophils Relative: 2
Lymphocytes Relative: 57 — ABNORMAL HIGH
Lymphs Abs: 4.2 — ABNORMAL HIGH
Monocytes Absolute: 0.6
Monocytes Relative: 8
Neutro Abs: 2.3
Neutrophils Relative %: 32 — ABNORMAL LOW

## 2010-10-28 LAB — SEDIMENTATION RATE: Sed Rate: 13

## 2011-01-25 DIAGNOSIS — M999 Biomechanical lesion, unspecified: Secondary | ICD-10-CM | POA: Diagnosis not present

## 2011-01-25 DIAGNOSIS — M5137 Other intervertebral disc degeneration, lumbosacral region: Secondary | ICD-10-CM | POA: Diagnosis not present

## 2011-01-31 DIAGNOSIS — M5137 Other intervertebral disc degeneration, lumbosacral region: Secondary | ICD-10-CM | POA: Diagnosis not present

## 2011-01-31 DIAGNOSIS — M999 Biomechanical lesion, unspecified: Secondary | ICD-10-CM | POA: Diagnosis not present

## 2011-02-03 DIAGNOSIS — M999 Biomechanical lesion, unspecified: Secondary | ICD-10-CM | POA: Diagnosis not present

## 2011-02-03 DIAGNOSIS — M5137 Other intervertebral disc degeneration, lumbosacral region: Secondary | ICD-10-CM | POA: Diagnosis not present

## 2011-02-15 DIAGNOSIS — M999 Biomechanical lesion, unspecified: Secondary | ICD-10-CM | POA: Diagnosis not present

## 2011-02-15 DIAGNOSIS — M5137 Other intervertebral disc degeneration, lumbosacral region: Secondary | ICD-10-CM | POA: Diagnosis not present

## 2011-02-23 DIAGNOSIS — M5137 Other intervertebral disc degeneration, lumbosacral region: Secondary | ICD-10-CM | POA: Diagnosis not present

## 2011-02-23 DIAGNOSIS — M999 Biomechanical lesion, unspecified: Secondary | ICD-10-CM | POA: Diagnosis not present

## 2011-02-24 DIAGNOSIS — M5137 Other intervertebral disc degeneration, lumbosacral region: Secondary | ICD-10-CM | POA: Diagnosis not present

## 2011-02-24 DIAGNOSIS — M999 Biomechanical lesion, unspecified: Secondary | ICD-10-CM | POA: Diagnosis not present

## 2011-02-25 DIAGNOSIS — M999 Biomechanical lesion, unspecified: Secondary | ICD-10-CM | POA: Diagnosis not present

## 2011-02-25 DIAGNOSIS — M5137 Other intervertebral disc degeneration, lumbosacral region: Secondary | ICD-10-CM | POA: Diagnosis not present

## 2011-03-03 DIAGNOSIS — M999 Biomechanical lesion, unspecified: Secondary | ICD-10-CM | POA: Diagnosis not present

## 2011-03-03 DIAGNOSIS — M5137 Other intervertebral disc degeneration, lumbosacral region: Secondary | ICD-10-CM | POA: Diagnosis not present

## 2011-03-08 DIAGNOSIS — M5137 Other intervertebral disc degeneration, lumbosacral region: Secondary | ICD-10-CM | POA: Diagnosis not present

## 2011-03-08 DIAGNOSIS — M999 Biomechanical lesion, unspecified: Secondary | ICD-10-CM | POA: Diagnosis not present

## 2011-03-09 DIAGNOSIS — M545 Low back pain, unspecified: Secondary | ICD-10-CM | POA: Diagnosis not present

## 2011-03-10 DIAGNOSIS — M5137 Other intervertebral disc degeneration, lumbosacral region: Secondary | ICD-10-CM | POA: Diagnosis not present

## 2011-03-10 DIAGNOSIS — M999 Biomechanical lesion, unspecified: Secondary | ICD-10-CM | POA: Diagnosis not present

## 2011-03-16 DIAGNOSIS — M5137 Other intervertebral disc degeneration, lumbosacral region: Secondary | ICD-10-CM | POA: Diagnosis not present

## 2011-03-17 DIAGNOSIS — M999 Biomechanical lesion, unspecified: Secondary | ICD-10-CM | POA: Diagnosis not present

## 2011-03-17 DIAGNOSIS — M545 Low back pain, unspecified: Secondary | ICD-10-CM | POA: Diagnosis not present

## 2011-03-17 DIAGNOSIS — M5137 Other intervertebral disc degeneration, lumbosacral region: Secondary | ICD-10-CM | POA: Diagnosis not present

## 2011-03-21 DIAGNOSIS — M47817 Spondylosis without myelopathy or radiculopathy, lumbosacral region: Secondary | ICD-10-CM | POA: Diagnosis not present

## 2011-03-24 DIAGNOSIS — M999 Biomechanical lesion, unspecified: Secondary | ICD-10-CM | POA: Diagnosis not present

## 2011-03-24 DIAGNOSIS — M5137 Other intervertebral disc degeneration, lumbosacral region: Secondary | ICD-10-CM | POA: Diagnosis not present

## 2011-04-18 DIAGNOSIS — M999 Biomechanical lesion, unspecified: Secondary | ICD-10-CM | POA: Diagnosis not present

## 2011-04-18 DIAGNOSIS — M5137 Other intervertebral disc degeneration, lumbosacral region: Secondary | ICD-10-CM | POA: Diagnosis not present

## 2011-05-31 DIAGNOSIS — R300 Dysuria: Secondary | ICD-10-CM | POA: Diagnosis not present

## 2011-06-01 DIAGNOSIS — H18519 Endothelial corneal dystrophy, unspecified eye: Secondary | ICD-10-CM | POA: Diagnosis not present

## 2011-06-01 DIAGNOSIS — H538 Other visual disturbances: Secondary | ICD-10-CM | POA: Diagnosis not present

## 2011-06-01 DIAGNOSIS — H2510 Age-related nuclear cataract, unspecified eye: Secondary | ICD-10-CM | POA: Diagnosis not present

## 2011-06-01 DIAGNOSIS — H40019 Open angle with borderline findings, low risk, unspecified eye: Secondary | ICD-10-CM | POA: Diagnosis not present

## 2011-06-01 DIAGNOSIS — H353190 Nonexudative age-related macular degeneration, unspecified eye, stage unspecified: Secondary | ICD-10-CM | POA: Diagnosis not present

## 2011-07-07 DIAGNOSIS — M5137 Other intervertebral disc degeneration, lumbosacral region: Secondary | ICD-10-CM | POA: Diagnosis not present

## 2011-07-07 DIAGNOSIS — M999 Biomechanical lesion, unspecified: Secondary | ICD-10-CM | POA: Diagnosis not present

## 2011-08-01 DIAGNOSIS — M5137 Other intervertebral disc degeneration, lumbosacral region: Secondary | ICD-10-CM | POA: Diagnosis not present

## 2011-08-01 DIAGNOSIS — M999 Biomechanical lesion, unspecified: Secondary | ICD-10-CM | POA: Diagnosis not present

## 2011-08-02 DIAGNOSIS — M5137 Other intervertebral disc degeneration, lumbosacral region: Secondary | ICD-10-CM | POA: Diagnosis not present

## 2011-08-02 DIAGNOSIS — M999 Biomechanical lesion, unspecified: Secondary | ICD-10-CM | POA: Diagnosis not present

## 2011-08-04 DIAGNOSIS — M5137 Other intervertebral disc degeneration, lumbosacral region: Secondary | ICD-10-CM | POA: Diagnosis not present

## 2011-08-04 DIAGNOSIS — M999 Biomechanical lesion, unspecified: Secondary | ICD-10-CM | POA: Diagnosis not present

## 2011-08-16 DIAGNOSIS — R51 Headache: Secondary | ICD-10-CM | POA: Diagnosis not present

## 2011-08-17 DIAGNOSIS — M531 Cervicobrachial syndrome: Secondary | ICD-10-CM | POA: Diagnosis not present

## 2011-08-19 DIAGNOSIS — G521 Disorders of glossopharyngeal nerve: Secondary | ICD-10-CM | POA: Diagnosis not present

## 2011-08-23 ENCOUNTER — Telehealth: Payer: Self-pay | Admitting: Obstetrics and Gynecology

## 2011-08-24 ENCOUNTER — Encounter: Payer: Self-pay | Admitting: Obstetrics and Gynecology

## 2011-08-24 ENCOUNTER — Ambulatory Visit (INDEPENDENT_AMBULATORY_CARE_PROVIDER_SITE_OTHER): Payer: Medicare Other | Admitting: Obstetrics and Gynecology

## 2011-08-24 ENCOUNTER — Ambulatory Visit (INDEPENDENT_AMBULATORY_CARE_PROVIDER_SITE_OTHER): Payer: Medicare Other

## 2011-08-24 VITALS — BP 114/70 | Temp 98.1°F | Resp 16 | Ht 64.0 in | Wt 144.0 lb

## 2011-08-24 DIAGNOSIS — R109 Unspecified abdominal pain: Secondary | ICD-10-CM | POA: Diagnosis not present

## 2011-08-24 DIAGNOSIS — Z139 Encounter for screening, unspecified: Secondary | ICD-10-CM | POA: Diagnosis not present

## 2011-08-24 DIAGNOSIS — M899 Disorder of bone, unspecified: Secondary | ICD-10-CM

## 2011-08-24 DIAGNOSIS — M949 Disorder of cartilage, unspecified: Secondary | ICD-10-CM | POA: Diagnosis not present

## 2011-08-24 DIAGNOSIS — M810 Age-related osteoporosis without current pathological fracture: Secondary | ICD-10-CM

## 2011-08-24 DIAGNOSIS — Z13820 Encounter for screening for osteoporosis: Secondary | ICD-10-CM | POA: Diagnosis not present

## 2011-08-24 DIAGNOSIS — Z01419 Encounter for gynecological examination (general) (routine) without abnormal findings: Secondary | ICD-10-CM

## 2011-08-24 DIAGNOSIS — R102 Pelvic and perineal pain: Secondary | ICD-10-CM

## 2011-08-24 DIAGNOSIS — Z124 Encounter for screening for malignant neoplasm of cervix: Secondary | ICD-10-CM

## 2011-08-24 DIAGNOSIS — E559 Vitamin D deficiency, unspecified: Secondary | ICD-10-CM | POA: Diagnosis not present

## 2011-08-24 DIAGNOSIS — R634 Abnormal weight loss: Secondary | ICD-10-CM | POA: Diagnosis not present

## 2011-08-24 DIAGNOSIS — N9489 Other specified conditions associated with female genital organs and menstrual cycle: Secondary | ICD-10-CM | POA: Diagnosis not present

## 2011-08-24 LAB — CBC
HCT: 35.1 % — ABNORMAL LOW (ref 36.0–46.0)
Hemoglobin: 12.1 g/dL (ref 12.0–15.0)
MCH: 29.7 pg (ref 26.0–34.0)
MCHC: 34.5 g/dL (ref 30.0–36.0)
MCV: 86.2 fL (ref 78.0–100.0)
Platelets: 203 10*3/uL (ref 150–400)
RBC: 4.07 MIL/uL (ref 3.87–5.11)
RDW: 14.5 % (ref 11.5–15.5)
WBC: 7.4 10*3/uL (ref 4.0–10.5)

## 2011-08-24 LAB — POCT URINALYSIS DIPSTICK
Bilirubin, UA: NEGATIVE
Blood, UA: NEGATIVE
Glucose, UA: NEGATIVE
Ketones, UA: NEGATIVE
Leukocytes, UA: NEGATIVE
Nitrite, UA: NEGATIVE
Protein, UA: NEGATIVE
Spec Grav, UA: <=1.005
Urobilinogen, UA: NEGATIVE
pH, UA: 5

## 2011-08-24 MED ORDER — ESTRADIOL 0.1 MG/GM VA CREA: 1.0000 g | TOPICAL_CREAM | VAGINAL | Status: DC

## 2011-08-24 MED ORDER — SOLIFENACIN SUCCINATE 5 MG PO TABS: 5.0000 mg | ORAL_TABLET | Freq: Every day | ORAL | Status: DC | PRN

## 2011-08-24 NOTE — Progress Notes (Signed)
Contraception HYST Last pap 2008/ HYST Last Mammo 11/2010 wnl Last Colonoscopy 3-4 yrs ago Last Dexa Scan 2011 June Primary MD  [place]/ [facility] at [facility] No  Several complaints.  Wt loss and just doesn't feel well.  Filed Vitals:   08/24/11 1532  BP: 114/70  Temp: 98.1 F (36.7 C)  Resp: 16   ROS: noncontributory  Physical Examination: General appearance - alert, well appearing, and in no distress Neck - supple, no significant adenopathy Chest - clear to auscultation, no wheezes, rales or rhonchi, symmetric air entry Heart - normal rate and regular rhythm Abdomen - soft, nontender, nondistended, no masses or organomegaly Breasts - breasts appear normal, no suspicious masses, no skin or nipple changes or axillary nodes Pelvic - normal external genitalia, vulva, vagina, adnexa, fullness in pelvis but no discrete palpable mass Back exam - no CVAT Extremities - no edema, redness or tenderness in the calves or thighs Rectal exam neg masses  A/P Labs today sched u/s BDS F/u after u/s Requests refill of estrace and vesicare

## 2011-08-25 ENCOUNTER — Telehealth: Payer: Self-pay

## 2011-08-25 DIAGNOSIS — M810 Age-related osteoporosis without current pathological fracture: Secondary | ICD-10-CM

## 2011-08-25 LAB — COMPREHENSIVE METABOLIC PANEL
ALT: 11 U/L (ref 0–35)
AST: 15 U/L (ref 0–37)
Albumin: 4 g/dL (ref 3.5–5.2)
Alkaline Phosphatase: 60 U/L (ref 39–117)
BUN: 19 mg/dL (ref 6–23)
CO2: 29 mEq/L (ref 19–32)
Calcium: 9.4 mg/dL (ref 8.4–10.5)
Chloride: 103 mEq/L (ref 96–112)
Creat: 0.98 mg/dL (ref 0.50–1.10)
Glucose, Bld: 112 mg/dL — ABNORMAL HIGH (ref 70–99)
Potassium: 4.1 mEq/L (ref 3.5–5.3)
Sodium: 138 mEq/L (ref 135–145)
Total Bilirubin: 0.3 mg/dL (ref 0.3–1.2)
Total Protein: 6.7 g/dL (ref 6.0–8.3)

## 2011-08-25 LAB — TSH: TSH: 0.786 u[IU]/mL (ref 0.350–4.500)

## 2011-08-25 LAB — VITAMIN D 25 HYDROXY (VIT D DEFICIENCY, FRACTURES): Vit D, 25-Hydroxy: 43 ng/mL (ref 30–89)

## 2011-08-25 MED ORDER — ALENDRONATE SODIUM 70 MG PO TABS: 70.0000 mg | ORAL_TABLET | ORAL | Status: DC

## 2011-08-25 NOTE — Telephone Encounter (Signed)
Spoke to pt to let her know about Dexa results. It showed osteoporosis and osteopenia. Rec. Is to start on Fosamax. Pt agrees to start on it. It will be sent to [facility] on  [address]. Fosamax 70 mg weekly. Per AR. Alvino Chapel

## 2011-08-29 DIAGNOSIS — M999 Biomechanical lesion, unspecified: Secondary | ICD-10-CM | POA: Diagnosis not present

## 2011-08-29 DIAGNOSIS — M5137 Other intervertebral disc degeneration, lumbosacral region: Secondary | ICD-10-CM | POA: Diagnosis not present

## 2011-09-13 DIAGNOSIS — M999 Biomechanical lesion, unspecified: Secondary | ICD-10-CM | POA: Diagnosis not present

## 2011-09-13 DIAGNOSIS — M5137 Other intervertebral disc degeneration, lumbosacral region: Secondary | ICD-10-CM | POA: Diagnosis not present

## 2011-09-22 ENCOUNTER — Other Ambulatory Visit: Payer: Self-pay | Admitting: Obstetrics and Gynecology

## 2011-09-22 ENCOUNTER — Encounter: Payer: Self-pay | Admitting: Obstetrics and Gynecology

## 2011-09-22 ENCOUNTER — Ambulatory Visit (INDEPENDENT_AMBULATORY_CARE_PROVIDER_SITE_OTHER): Payer: Medicare Other

## 2011-09-22 ENCOUNTER — Ambulatory Visit (INDEPENDENT_AMBULATORY_CARE_PROVIDER_SITE_OTHER): Payer: Medicare Other | Admitting: Obstetrics and Gynecology

## 2011-09-22 VITALS — BP 122/62 | Ht 63.0 in | Wt 144.0 lb

## 2011-09-22 DIAGNOSIS — R102 Pelvic and perineal pain: Secondary | ICD-10-CM

## 2011-09-22 DIAGNOSIS — N9489 Other specified conditions associated with female genital organs and menstrual cycle: Secondary | ICD-10-CM | POA: Diagnosis not present

## 2011-09-22 DIAGNOSIS — R109 Unspecified abdominal pain: Secondary | ICD-10-CM | POA: Diagnosis not present

## 2011-09-22 DIAGNOSIS — M810 Age-related osteoporosis without current pathological fracture: Secondary | ICD-10-CM | POA: Insufficient documentation

## 2011-09-22 NOTE — Progress Notes (Addendum)
Here to f/u u/s after c/o unexpected wt loss Labs reviewed U/s reviewed - Ultrasound today uterus is surgically absent normal vaginal cuff normal bilateral ovaries with the left ovary measuring 1.5 cm and the right ovary measuring 1.4 cm no fluid in cul-de-sac is noted   Filed Vitals:   09/22/11 1111  BP: 122/62   Office Visit on 08/24/2011  Component Date Value Range Status  . Color, UA 08/24/2011 yellow   Final  . Clarity, UA 08/24/2011 clr   Final  . Glucose, UA 08/24/2011 neg   Final  . Bilirubin, UA 08/24/2011 neg   Final  . Ketones, UA 08/24/2011 neg   Final  . Spec Grav, UA 08/24/2011 <=1.005   Final  . Blood, UA 08/24/2011 neg   Final  . pH, UA 08/24/2011 5.0   Final  . Protein, UA 08/24/2011 neg   Final  . Urobilinogen, UA 08/24/2011 negative   Final  . Nitrite, UA 08/24/2011 neg   Final  . Leukocytes, UA 08/24/2011 Negative   Final  . WBC 08/24/2011 7.4  4.0 - 10.5 K/uL Final  . RBC 08/24/2011 4.07  3.87 - 5.11 MIL/uL Final  . Hemoglobin 08/24/2011 12.1  12.0 - 15.0 g/dL Final  . HCT 16/10/9602 35.1* 36.0 - 46.0 % Final  . MCV 08/24/2011 86.2  78.0 - 100.0 fL Final  . MCH 08/24/2011 29.7  26.0 - 34.0 pg Final  . MCHC 08/24/2011 34.5  30.0 - 36.0 g/dL Final  . RDW 54/09/8117 14.5  11.5 - 15.5 % Final  . Platelets 08/24/2011 203  150 - 400 K/uL Final  . TSH 08/24/2011 0.786  0.350 - 4.500 uIU/mL Final  . Vit D, 25-Hydroxy 08/24/2011 43  30 - 89 ng/mL Final   Comment: This assay accurately quantifies Vitamin D, which is the sum of the                          25-Hydroxy forms of Vitamin D2 and D3.  Studies have shown that the                          optimum concentration of 25-Hydroxy Vitamin D is 30 ng/mL or higher.                           Concentrations of Vitamin D between 20 and 29 ng/mL are considered to                          be insufficient and concentrations less than 20 ng/mL are considered                          to be deficient for Vitamin D.  . Sodium  08/24/2011 138  135 - 145 mEq/L Final  . Potassium 08/24/2011 4.1  3.5 - 5.3 mEq/L Final  . Chloride 08/24/2011 103  96 - 112 mEq/L Final  . CO2 08/24/2011 29  19 - 32 mEq/L Final  . Glucose, Bld 08/24/2011 112* 70 - 99 mg/dL Final  . BUN 14/78/2956 19  6 - 23 mg/dL Final  . Creat 21/30/8657 0.98  0.50 - 1.10 mg/dL Final  . Total Bilirubin 08/24/2011 0.3  0.3 - 1.2 mg/dL Final  . Alkaline Phosphatase 08/24/2011 60  39 - 117 U/L Final  . AST 08/24/2011 15  0 - 37 U/L Final  . ALT 08/24/2011 11  0 - 35 U/L Final  . Total Protein 08/24/2011 6.7  6.0 - 8.3 g/dL Final  . Albumin 16/10/9602 4.0  3.5 - 5.2 g/dL Final  . Calcium 54/09/8117 9.4  8.4 - 10.5 mg/dL Final    A/P Nl u/s See PCP re wt loss RTO for AEX Osteoporosis of Spine - recs discussed - declines fosamax but may consider if worsening on next yrs BDS

## 2011-09-25 ENCOUNTER — Encounter (HOSPITAL_COMMUNITY): Payer: Self-pay | Admitting: *Deleted

## 2011-09-25 ENCOUNTER — Emergency Department (HOSPITAL_COMMUNITY)
Admission: EM | Admit: 2011-09-25 | Discharge: 2011-09-26 | Disposition: A | Discharge status: Home / Self Care | Payer: Medicare Other | Attending: Emergency Medicine | Admitting: Emergency Medicine

## 2011-09-25 ENCOUNTER — Emergency Department (HOSPITAL_COMMUNITY): Payer: Medicare Other

## 2011-09-25 DIAGNOSIS — S61219A Laceration without foreign body of unspecified finger without damage to nail, initial encounter: Secondary | ICD-10-CM

## 2011-09-25 DIAGNOSIS — I059 Rheumatic mitral valve disease, unspecified: Secondary | ICD-10-CM | POA: Insufficient documentation

## 2011-09-25 DIAGNOSIS — S61209A Unspecified open wound of unspecified finger without damage to nail, initial encounter: Secondary | ICD-10-CM | POA: Diagnosis not present

## 2011-09-25 DIAGNOSIS — M79609 Pain in unspecified limb: Secondary | ICD-10-CM | POA: Diagnosis not present

## 2011-09-25 DIAGNOSIS — E7800 Pure hypercholesterolemia, unspecified: Secondary | ICD-10-CM | POA: Diagnosis not present

## 2011-09-25 DIAGNOSIS — K219 Gastro-esophageal reflux disease without esophagitis: Secondary | ICD-10-CM | POA: Diagnosis not present

## 2011-09-25 DIAGNOSIS — I10 Essential (primary) hypertension: Secondary | ICD-10-CM | POA: Insufficient documentation

## 2011-09-25 DIAGNOSIS — M7989 Other specified soft tissue disorders: Secondary | ICD-10-CM | POA: Diagnosis not present

## 2011-09-25 DIAGNOSIS — M949 Disorder of cartilage, unspecified: Secondary | ICD-10-CM | POA: Insufficient documentation

## 2011-09-25 DIAGNOSIS — M899 Disorder of bone, unspecified: Secondary | ICD-10-CM | POA: Diagnosis not present

## 2011-09-25 DIAGNOSIS — W230XXA Caught, crushed, jammed, or pinched between moving objects, initial encounter: Secondary | ICD-10-CM | POA: Insufficient documentation

## 2011-09-25 DIAGNOSIS — S6000XA Contusion of unspecified finger without damage to nail, initial encounter: Secondary | ICD-10-CM | POA: Diagnosis not present

## 2011-09-25 MED ORDER — HYDROCODONE-ACETAMINOPHEN 5-325 MG PO TABS
1.0000 | ORAL_TABLET | Freq: Once | ORAL | Status: AC
  Administered 2011-09-25: 1 via ORAL
  Filled 2011-09-25: qty 1

## 2011-09-25 MED ORDER — HYDROCODONE-ACETAMINOPHEN 5-500 MG PO TABS: 1.0000 | ORAL_TABLET | Freq: Four times a day (QID) | ORAL | Status: DC | PRN

## 2011-09-25 MED ORDER — TETANUS-DIPHTH-ACELL PERTUSSIS 5-2-15.5 LF-MCG/0.5 IM SUSP: 0.5000 mL | INTRAMUSCULAR | Status: DC

## 2011-09-25 MED ORDER — LIDOCAINE HCL (PF) 1 % IJ SOLN: 30.0000 mL | Freq: Once | INTRAMUSCULAR | Status: DC

## 2011-09-25 MED ORDER — TETANUS-DIPHTH-ACELL PERTUSSIS 5-2.5-18.5 LF-MCG/0.5 IM SUSP
0.5000 mL | Freq: Once | INTRAMUSCULAR | Status: AC
  Administered 2011-09-25: 0.5 mL via INTRAMUSCULAR
  Filled 2011-09-25 (×2): qty 0.5
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
## 2011-09-25 NOTE — Progress Notes (Signed)
Orthopedic Tech Progress Note Patient Details:  [name] 10/21/1927 [id number]  Ortho Devices Type of Ortho Device: Finger splint Ortho Device/Splint Location: (R) UE Ortho Device/Splint Interventions: Application   Jennye Moccasin 09/25/2011, 11:11 PM

## 2011-09-26 NOTE — ED Provider Notes (Signed)
Medical screening examination/treatment/procedure(s) were performed by non-physician practitioner and as supervising physician I was immediately available for consultation/collaboration.    Crystallynn Noorani L Malyn Aytes, MD 09/26/11 2000 

## 2011-10-03 ENCOUNTER — Emergency Department (HOSPITAL_COMMUNITY)
Admission: EM | Admit: 2011-10-03 | Discharge: 2011-10-03 | Disposition: A | Discharge status: Home / Self Care | Payer: Medicare Other | Attending: Emergency Medicine | Admitting: Emergency Medicine

## 2011-10-03 ENCOUNTER — Encounter (HOSPITAL_COMMUNITY): Payer: Self-pay | Admitting: *Deleted

## 2011-10-03 DIAGNOSIS — K219 Gastro-esophageal reflux disease without esophagitis: Secondary | ICD-10-CM | POA: Insufficient documentation

## 2011-10-03 DIAGNOSIS — Z4802 Encounter for removal of sutures: Secondary | ICD-10-CM | POA: Diagnosis not present

## 2011-10-03 DIAGNOSIS — Z91041 Radiographic dye allergy status: Secondary | ICD-10-CM | POA: Insufficient documentation

## 2011-10-03 DIAGNOSIS — I10 Essential (primary) hypertension: Secondary | ICD-10-CM | POA: Diagnosis not present

## 2011-10-03 NOTE — ED Notes (Signed)
Pt here for suture removal to right pinky finger, placed last Sunday.

## 2011-10-03 NOTE — ED Notes (Signed)
Patient returns for suture removal. 4 stiches noted in her right little finger. Site WDL

## 2011-10-03 NOTE — ED Provider Notes (Signed)
History  This chart was scribed for Laray Anger, DO by Shari Heritage. The patient was seen in room TR11C/TR11C. Patient's care was started at 1333.     CSN: [id number]  Arrival date & time 10/03/11  1245   First MD Initiated Contact with Patient 10/03/11 1333      Chief Complaint  Patient presents with  . Suture / Staple Removal    The history is provided by the patient. No language interpreter was used.    [name] is a 78 y.o. female who presents to the Emergency Department needing suture removal to her right 5th finger. Per past medical records, 4 sutures were placed to the right 5th finger on 09/25/11 in the ED.  Pt denies any other complaints.  No fevers, no open wounds, no drainage, no rash.     Past Medical History  Diagnosis Date  . Cystocele 2012  . Urge incontinence 2012  . Urinary frequency 2010  . GERD (gastroesophageal reflux disease)   . H/O hypercholesterolemia   . Hypertension   . H/O varicose veins   . Mitral valve prolapse   . H/O varicella   . Mumps   . Abnormal Pap smear 1975-76  . H/O osteopenia     Past Surgical History  Procedure Date  . Abdominal hysterectomy     History  Substance Use Topics  . Smoking status: Never Smoker   . Smokeless tobacco: Never Used  . Alcohol Use: No    OB History    Grav Para Term Preterm Abortions TAB SAB Ect Mult Living   3 3 3       3       Review of Systems ROS: Statement: All systems negative except as marked or noted in the HPI; Constitutional: Negative for fever and chills. ; ; Eyes: Negative for eye pain, redness and discharge. ; ; ENMT: Negative for ear pain, hoarseness, nasal congestion, sinus pressure and sore throat. ; ; Cardiovascular: Negative for chest pain, palpitations, diaphoresis, dyspnea and peripheral edema. ; ; Respiratory: Negative for cough, wheezing and stridor. ; ; Gastrointestinal: Negative for nausea, vomiting, diarrhea, abdominal pain, blood in stool, hematemesis, jaundice  and rectal bleeding. . ; ; Genitourinary: Negative for dysuria, flank pain and hematuria. ; ; Musculoskeletal: Negative for back pain and neck pain. Negative for swelling and trauma.; ; Skin: +laceration. Negative for pruritus, rash, abrasions, blisters, bruising and skin lesion.; ; Neuro: Negative for headache, lightheadedness and neck stiffness. Negative for weakness, altered level of consciousness , altered mental status, extremity weakness, paresthesias, involuntary movement, seizure and syncope.      Allergies  Ivp dye  Home Medications   Current Outpatient Rx  Name Route Sig Dispense Refill  . CALCIUM CARBONATE PO Oral Take 1 tablet by mouth daily.    Marland Kitchen VITAMIN D 1000 UNITS PO TABS Oral Take 1,000 Units by mouth daily.    Marland Kitchen VITAMIN B 12 PO Oral Take 1 tablet by mouth 2 (two) times a week.     . OMEGA-3 FATTY ACIDS 1000 MG PO CAPS Oral Take 1 g by mouth daily.     . ADULT MULTIVITAMIN W/MINERALS CH Oral Take 1 tablet by mouth daily.      BP 129/69  Pulse 80  Temp 97.6 F (36.4 C) (Oral)  Resp 16  SpO2 97%  Physical Exam 1335: Physical examination:  Nursing notes reviewed; Vital signs and O2 SAT reviewed;  Constitutional: Well developed, Well nourished, Well hydrated, In no acute  distress; Head:  Normocephalic, atraumatic; Eyes: EOMI, PERRL, No scleral icterus; ENMT: Mouth and pharynx normal, Mucous membranes moist; Neck: Supple, Full range of motion, No lymphadenopathy; Cardiovascular: Regular rate and rhythm, No murmur, rub, or gallop; Respiratory: Breath sounds clear & equal bilaterally, No rales, rhonchi, wheezes.  Speaking full sentences with ease, Normal respiratory effort/excursion; Chest: Nontender, Movement normal;; Extremities: Pulses normal, No tenderness, No edema, No calf edema or asymmetry.; Neuro: AA&Ox3, Major CN grossly intact.  Speech clear. No gross focal motor or sensory deficits in extremities.; Skin: Color normal, Warm, Dry; +4 intact sutures right 5th finger with  wound edges well approximated, no drainage, no bleeding, no erythema, no edema.     ED Course  Procedures    MDM  MDM Reviewed: nursing note, vitals and previous chart     1355:  4 sutures placed 8 days ago, removed intact by ED RN.  No signs of infection. D/C and f/u with PMD prn.      I personally performed the services described in this documentation, which was scribed in my presence. The recorded information has been reviewed and considered. Becca Bayne Allison Quarry, DO 10/05/11 1325

## 2011-10-11 DIAGNOSIS — S6980XA Other specified injuries of unspecified wrist, hand and finger(s), initial encounter: Secondary | ICD-10-CM | POA: Diagnosis not present

## 2011-10-11 DIAGNOSIS — S6990XA Unspecified injury of unspecified wrist, hand and finger(s), initial encounter: Secondary | ICD-10-CM | POA: Diagnosis not present

## 2011-10-18 DIAGNOSIS — Z23 Encounter for immunization: Secondary | ICD-10-CM | POA: Diagnosis not present

## 2011-11-02 DIAGNOSIS — H353190 Nonexudative age-related macular degeneration, unspecified eye, stage unspecified: Secondary | ICD-10-CM | POA: Diagnosis not present

## 2011-11-02 DIAGNOSIS — H2510 Age-related nuclear cataract, unspecified eye: Secondary | ICD-10-CM | POA: Diagnosis not present

## 2011-11-02 DIAGNOSIS — D3130 Benign neoplasm of unspecified choroid: Secondary | ICD-10-CM | POA: Diagnosis not present

## 2011-11-02 DIAGNOSIS — H40019 Open angle with borderline findings, low risk, unspecified eye: Secondary | ICD-10-CM | POA: Diagnosis not present

## 2011-11-02 DIAGNOSIS — H538 Other visual disturbances: Secondary | ICD-10-CM | POA: Diagnosis not present

## 2011-11-22 DIAGNOSIS — H2510 Age-related nuclear cataract, unspecified eye: Secondary | ICD-10-CM | POA: Diagnosis not present

## 2011-11-22 DIAGNOSIS — H269 Unspecified cataract: Secondary | ICD-10-CM | POA: Diagnosis not present

## 2011-11-24 DIAGNOSIS — M999 Biomechanical lesion, unspecified: Secondary | ICD-10-CM | POA: Diagnosis not present

## 2011-11-24 DIAGNOSIS — M5137 Other intervertebral disc degeneration, lumbosacral region: Secondary | ICD-10-CM | POA: Diagnosis not present

## 2011-11-28 DIAGNOSIS — Z1231 Encounter for screening mammogram for malignant neoplasm of breast: Secondary | ICD-10-CM | POA: Diagnosis not present

## 2011-12-01 NOTE — Telephone Encounter (Signed)
Note to close encounter. [name], [name]  

## 2011-12-22 DIAGNOSIS — H2510 Age-related nuclear cataract, unspecified eye: Secondary | ICD-10-CM | POA: Diagnosis not present

## 2011-12-28 DIAGNOSIS — E782 Mixed hyperlipidemia: Secondary | ICD-10-CM | POA: Diagnosis not present

## 2011-12-28 DIAGNOSIS — R609 Edema, unspecified: Secondary | ICD-10-CM | POA: Diagnosis not present

## 2011-12-29 ENCOUNTER — Other Ambulatory Visit (HOSPITAL_COMMUNITY): Payer: Self-pay | Admitting: Cardiology

## 2011-12-29 DIAGNOSIS — M7989 Other specified soft tissue disorders: Secondary | ICD-10-CM

## 2012-01-09 ENCOUNTER — Ambulatory Visit (HOSPITAL_COMMUNITY)
Admission: RE | Admit: 2012-01-09 | Discharge: 2012-01-09 | Disposition: A | Discharge status: Home / Self Care | Payer: Medicare Other | Source: Ambulatory Visit | Attending: Cardiology | Admitting: Cardiology

## 2012-01-09 ENCOUNTER — Other Ambulatory Visit (HOSPITAL_COMMUNITY): Payer: Self-pay | Admitting: *Deleted

## 2012-01-09 ENCOUNTER — Other Ambulatory Visit (HOSPITAL_COMMUNITY): Payer: Self-pay | Admitting: Cardiology

## 2012-01-09 DIAGNOSIS — M7989 Other specified soft tissue disorders: Secondary | ICD-10-CM | POA: Insufficient documentation

## 2012-01-09 HISTORY — PX: OTHER SURGICAL HISTORY: SHX169

## 2012-01-10 NOTE — Progress Notes (Signed)
BLE venous duplex completed. Finch Costanzo D  

## 2012-01-31 DIAGNOSIS — H2510 Age-related nuclear cataract, unspecified eye: Secondary | ICD-10-CM | POA: Diagnosis not present

## 2012-01-31 DIAGNOSIS — H269 Unspecified cataract: Secondary | ICD-10-CM | POA: Diagnosis not present

## 2012-03-13 ENCOUNTER — Encounter (INDEPENDENT_AMBULATORY_CARE_PROVIDER_SITE_OTHER): Payer: Medicare Other | Admitting: Ophthalmology

## 2012-03-13 DIAGNOSIS — H353290 Exudative age-related macular degeneration, unspecified eye, stage unspecified: Secondary | ICD-10-CM

## 2012-03-13 DIAGNOSIS — H3530 Unspecified macular degeneration: Secondary | ICD-10-CM | POA: Diagnosis not present

## 2012-03-13 DIAGNOSIS — H43819 Vitreous degeneration, unspecified eye: Secondary | ICD-10-CM | POA: Diagnosis not present

## 2012-03-15 DIAGNOSIS — R030 Elevated blood-pressure reading, without diagnosis of hypertension: Secondary | ICD-10-CM | POA: Diagnosis not present

## 2012-03-15 DIAGNOSIS — R0602 Shortness of breath: Secondary | ICD-10-CM | POA: Diagnosis not present

## 2012-04-03 DIAGNOSIS — R002 Palpitations: Secondary | ICD-10-CM | POA: Diagnosis not present

## 2012-04-03 DIAGNOSIS — R0602 Shortness of breath: Secondary | ICD-10-CM | POA: Diagnosis not present

## 2012-04-03 DIAGNOSIS — R030 Elevated blood-pressure reading, without diagnosis of hypertension: Secondary | ICD-10-CM | POA: Diagnosis not present

## 2012-04-06 ENCOUNTER — Encounter (INDEPENDENT_AMBULATORY_CARE_PROVIDER_SITE_OTHER): Payer: Medicare Other | Admitting: Ophthalmology

## 2012-04-06 DIAGNOSIS — H3530 Unspecified macular degeneration: Secondary | ICD-10-CM | POA: Diagnosis not present

## 2012-04-06 DIAGNOSIS — H43819 Vitreous degeneration, unspecified eye: Secondary | ICD-10-CM | POA: Diagnosis not present

## 2012-04-06 DIAGNOSIS — H353290 Exudative age-related macular degeneration, unspecified eye, stage unspecified: Secondary | ICD-10-CM

## 2012-04-09 ENCOUNTER — Encounter (INDEPENDENT_AMBULATORY_CARE_PROVIDER_SITE_OTHER): Payer: Medicare Other | Admitting: Ophthalmology

## 2012-05-07 DIAGNOSIS — H40019 Open angle with borderline findings, low risk, unspecified eye: Secondary | ICD-10-CM | POA: Diagnosis not present

## 2012-05-16 ENCOUNTER — Encounter (INDEPENDENT_AMBULATORY_CARE_PROVIDER_SITE_OTHER): Payer: Medicare Other | Admitting: Ophthalmology

## 2012-05-16 DIAGNOSIS — H3530 Unspecified macular degeneration: Secondary | ICD-10-CM

## 2012-05-16 DIAGNOSIS — H43819 Vitreous degeneration, unspecified eye: Secondary | ICD-10-CM

## 2012-05-16 DIAGNOSIS — H353290 Exudative age-related macular degeneration, unspecified eye, stage unspecified: Secondary | ICD-10-CM | POA: Diagnosis not present

## 2012-07-04 ENCOUNTER — Encounter (INDEPENDENT_AMBULATORY_CARE_PROVIDER_SITE_OTHER): Payer: Medicare Other | Admitting: Ophthalmology

## 2012-07-04 DIAGNOSIS — H353290 Exudative age-related macular degeneration, unspecified eye, stage unspecified: Secondary | ICD-10-CM | POA: Diagnosis not present

## 2012-07-04 DIAGNOSIS — H3530 Unspecified macular degeneration: Secondary | ICD-10-CM | POA: Diagnosis not present

## 2012-07-04 DIAGNOSIS — H35379 Puckering of macula, unspecified eye: Secondary | ICD-10-CM | POA: Diagnosis not present

## 2012-07-04 DIAGNOSIS — H43819 Vitreous degeneration, unspecified eye: Secondary | ICD-10-CM

## 2012-08-13 DIAGNOSIS — R159 Full incontinence of feces: Secondary | ICD-10-CM | POA: Diagnosis not present

## 2012-08-30 DIAGNOSIS — M949 Disorder of cartilage, unspecified: Secondary | ICD-10-CM | POA: Diagnosis not present

## 2012-08-30 DIAGNOSIS — R32 Unspecified urinary incontinence: Secondary | ICD-10-CM | POA: Diagnosis not present

## 2012-08-30 DIAGNOSIS — M899 Disorder of bone, unspecified: Secondary | ICD-10-CM | POA: Diagnosis not present

## 2012-08-30 DIAGNOSIS — M810 Age-related osteoporosis without current pathological fracture: Secondary | ICD-10-CM | POA: Diagnosis not present

## 2012-08-30 DIAGNOSIS — Z124 Encounter for screening for malignant neoplasm of cervix: Secondary | ICD-10-CM | POA: Diagnosis not present

## 2012-09-12 ENCOUNTER — Encounter (INDEPENDENT_AMBULATORY_CARE_PROVIDER_SITE_OTHER): Payer: Medicare Other | Admitting: Ophthalmology

## 2012-09-12 DIAGNOSIS — H353290 Exudative age-related macular degeneration, unspecified eye, stage unspecified: Secondary | ICD-10-CM | POA: Diagnosis not present

## 2012-09-12 DIAGNOSIS — H3530 Unspecified macular degeneration: Secondary | ICD-10-CM | POA: Diagnosis not present

## 2012-09-12 DIAGNOSIS — H43819 Vitreous degeneration, unspecified eye: Secondary | ICD-10-CM

## 2012-09-27 DIAGNOSIS — N183 Chronic kidney disease, stage 3 unspecified: Secondary | ICD-10-CM | POA: Diagnosis not present

## 2012-09-27 DIAGNOSIS — Z1331 Encounter for screening for depression: Secondary | ICD-10-CM | POA: Diagnosis not present

## 2012-09-27 DIAGNOSIS — Z0000 Encounter for general adult medical examination without abnormal findings: Secondary | ICD-10-CM | POA: Diagnosis not present

## 2012-10-23 DIAGNOSIS — Z23 Encounter for immunization: Secondary | ICD-10-CM | POA: Diagnosis not present

## 2012-11-05 DIAGNOSIS — H35039 Hypertensive retinopathy, unspecified eye: Secondary | ICD-10-CM | POA: Diagnosis not present

## 2012-11-05 DIAGNOSIS — H18519 Endothelial corneal dystrophy, unspecified eye: Secondary | ICD-10-CM | POA: Diagnosis not present

## 2012-11-05 DIAGNOSIS — H10509 Unspecified blepharoconjunctivitis, unspecified eye: Secondary | ICD-10-CM | POA: Diagnosis not present

## 2012-11-05 DIAGNOSIS — H353190 Nonexudative age-related macular degeneration, unspecified eye, stage unspecified: Secondary | ICD-10-CM | POA: Diagnosis not present

## 2012-11-05 DIAGNOSIS — H43819 Vitreous degeneration, unspecified eye: Secondary | ICD-10-CM | POA: Diagnosis not present

## 2012-11-05 DIAGNOSIS — H40019 Open angle with borderline findings, low risk, unspecified eye: Secondary | ICD-10-CM | POA: Diagnosis not present

## 2012-11-05 DIAGNOSIS — H3581 Retinal edema: Secondary | ICD-10-CM | POA: Diagnosis not present

## 2012-11-06 DIAGNOSIS — N390 Urinary tract infection, site not specified: Secondary | ICD-10-CM | POA: Diagnosis not present

## 2012-11-06 DIAGNOSIS — R6889 Other general symptoms and signs: Secondary | ICD-10-CM | POA: Diagnosis not present

## 2012-11-28 DIAGNOSIS — Z1231 Encounter for screening mammogram for malignant neoplasm of breast: Secondary | ICD-10-CM | POA: Diagnosis not present

## 2012-12-05 ENCOUNTER — Encounter (INDEPENDENT_AMBULATORY_CARE_PROVIDER_SITE_OTHER): Payer: Medicare Other | Admitting: Ophthalmology

## 2012-12-05 DIAGNOSIS — H43819 Vitreous degeneration, unspecified eye: Secondary | ICD-10-CM | POA: Diagnosis not present

## 2012-12-05 DIAGNOSIS — H353190 Nonexudative age-related macular degeneration, unspecified eye, stage unspecified: Secondary | ICD-10-CM

## 2012-12-05 DIAGNOSIS — H353290 Exudative age-related macular degeneration, unspecified eye, stage unspecified: Secondary | ICD-10-CM | POA: Diagnosis not present

## 2012-12-17 DIAGNOSIS — J069 Acute upper respiratory infection, unspecified: Secondary | ICD-10-CM | POA: Diagnosis not present

## 2012-12-18 ENCOUNTER — Ambulatory Visit (INDEPENDENT_AMBULATORY_CARE_PROVIDER_SITE_OTHER): Payer: Medicare Other | Admitting: Cardiology

## 2012-12-18 ENCOUNTER — Encounter: Payer: Self-pay | Admitting: Cardiology

## 2012-12-18 VITALS — BP 120/60 | HR 62 | Ht 63.0 in | Wt 144.4 lb

## 2012-12-18 DIAGNOSIS — Z862 Personal history of diseases of the blood and blood-forming organs and certain disorders involving the immune mechanism: Secondary | ICD-10-CM

## 2012-12-18 DIAGNOSIS — R600 Localized edema: Secondary | ICD-10-CM

## 2012-12-18 DIAGNOSIS — I10 Essential (primary) hypertension: Secondary | ICD-10-CM

## 2012-12-18 DIAGNOSIS — Z8639 Personal history of other endocrine, nutritional and metabolic disease: Secondary | ICD-10-CM

## 2012-12-18 DIAGNOSIS — R609 Edema, unspecified: Secondary | ICD-10-CM

## 2012-12-18 NOTE — Progress Notes (Signed)
PATIENTALEENAH Thornton MRN: [id number]  DOB: 1927-10-07   DOV:12/21/2012 PCP: Lillia Mountain, MD  Clinic Note: Chief Complaint  Patient presents with  . Annual Exam    No chest pain or dizziness.  Slight cold causing mild SOB- seen by Dr. Earl Gala yesterday, occas. swelling of left foot.    HPI: [name] is a 79 y.o.  female with a PMH below who presents today for annual followup for general cardiology evaluation. As you recall, she is a very pleasant 79 year old woman who is a long-term patient of Dr. Truett Perna  With essentially a negative cardiac evaluation to date. See history.  She comes in today. continuing to do well. Her major concern today is lower terminates swelling. This bothers her intermittently. The swelling does go down overnight, which is on her feet a lot it bothers her. She is also been interval with some sinus congestion and sore throat. That usually at baseline she is able walk he was admitted to a 2 miles. She denies any symptoms of chest tightness or pressure or dyspnea with rest or exertion. No PND, orthopnea.  No lightheadedness, dizziness, wooziness, syncope or near syncope. No TIA or amaurosis fugax symptoms. No melena, hematochezia or hematuria, and no claudication.   Past Medical History  Diagnosis Date  . Cystocele 2012  . Urge incontinence 2012  . Urinary frequency 2010  . GERD (gastroesophageal reflux disease)   . H/O hypercholesterolemia   . Hypertension   . H/O varicose veins   . Mitral valve prolapse   . H/O varicella   . Mumps   . Abnormal Pap smear 1975-76  . H/O osteopenia     Prior Cardiac Evaluation and Past Surgical History: Past Surgical History  Procedure Laterality Date  . Abdominal hysterectomy    . Nm myoview ltd  March 2009    Negative for ischemia or infarction  . Transthoracic echocardiogram  March 2009    Normal EF, mild aortic sclerosis with no stenosis.  . Lower extremity venous dopplers  01/09/2012    Right and left  lower steroids: No evidence of thrombus or, thrombophlebitis; right and left GSV and SSV: No venous insufficiency. Normal exam.    Allergies  Allergen Reactions  . Ivp Dye [Iodinated Diagnostic Agents] Hives    Current Outpatient Prescriptions  Medication Sig Dispense Refill  . CALCIUM CARBONATE PO Take 1 tablet by mouth daily.      . chlorhexidine (PERIDEX) 0.12 % solution as needed.      . cholecalciferol (VITAMIN D) 1000 UNITS tablet Take 1,000 Units by mouth daily.      . Cyanocobalamin (VITAMIN B 12 PO) Take 1 tablet by mouth 2 (two) times a week.       . fish oil-omega-3 fatty acids 1000 MG capsule Take 1 g by mouth daily.       . Multiple Vitamin (MULTIVITAMIN WITH MINERALS) TABS Take 1 tablet by mouth daily.       No current facility-administered medications for this visit.    History   Social History Narrative   She is a married mother of 3, grandmother of 6, great grandmother of 1. She walks   daily about 2 miles a day, does not smoke, does not drink.     ROS: A comprehensive Review of Systems - Negative except She does have some nighttime cramping in her legs and has been bothered by allergies lately.  sinus congestion and sore throat with postnasal drip   PHYSICAL EXAM  BP 120/60  Pulse 62  Ht 5\' 3"  (1.6 m)  Wt 144 lb 6.4 oz (65.499 kg)  BMI 25.59 kg/m2 General: She is a very pleasant, healthy appearing woman looking much younger than her stated age. well groomed, well nourished.A&Ox3, answers questions appropriately. NAD. normal mood and affect.  HEENT: NCAT, EOMI, MMM. Anicteric sclerae.  Neck: Supple, no LAN, JVD or carotid bruit.  Heart: RRR, normal S1, S2, no rubs or gallops, but she does have a soft SEM @ RUSB; no R/G; Nondisplaced PMI. Lungs: CTAB, nonlabored, normal effort, good air movement.  Abdomen: Soft/NT/ND/NABS.  Extremities: No C/C/E.   [id number] today: Yes Rate: 62 , Rhythm:  NSR; normal ECG  Recent Labs:  None  ASSESSMENT / PLAN: [name]  is a very pleasant elderly woman who is really essentially healthy. She does not have any major problems to speak of she has listed problems below. However her blood pressure is well-controlled as is her lipids. She is not on any medications. She has some mild lower extremity edema which is probably related to some venous insufficiency. I wasn't recommend support stockings to help with swelling. She has a mild cold symptoms the day but otherwise is doing fine. I don't think she needs any additional cardiac evaluation at this time. She simply fine for her annual followup.  Patient Active Problem List   Diagnosis Date Noted  . Essential hypertension; borderline     Priority: Medium  . Bilateral edema of lower extremity 12/21/2012    Priority: Low  . H/O hypercholesterolemia   . Osteoporosis - of the spine 09/22/2011   Essential hypertension; borderline Excellent control. No medications.  H/O hypercholesterolemia I do not have labs since 2004. His recall by her primary physician. She is on omega-3 fatty acids but otherwise no to medications.  Bilateral edema of lower extremity Would not treat with diuretic. I recommended support stockings.   Orders Placed This Encounter  Procedures  . EKG 12-Lead   Followup: annual followup.  DAVID W. Herbie Baltimore, M.D., M.S. [facility] [address] [place], [place]  [address]  [phone] Pager # [phone]

## 2012-12-18 NOTE — Patient Instructions (Signed)
Things seem stable.    I would recommend wearing support stockings to help with the swelling.  I hope your throat / cold clears up.  I am glad to keep you off of medications.  As long as you are active - I do not think that we would benefit anything from doing a stress test.  You are not having any symptoms.  Marykay Lex, MD

## 2012-12-21 ENCOUNTER — Encounter: Payer: Self-pay | Admitting: Cardiology

## 2012-12-21 DIAGNOSIS — Z8639 Personal history of other endocrine, nutritional and metabolic disease: Secondary | ICD-10-CM | POA: Insufficient documentation

## 2012-12-21 DIAGNOSIS — R600 Localized edema: Secondary | ICD-10-CM | POA: Insufficient documentation

## 2012-12-21 DIAGNOSIS — I10 Essential (primary) hypertension: Secondary | ICD-10-CM | POA: Insufficient documentation

## 2012-12-21 NOTE — Assessment & Plan Note (Signed)
Would not treat with diuretic. I recommended support stockings.

## 2012-12-21 NOTE — Assessment & Plan Note (Signed)
I do not have labs since 2004. His recall by her primary physician. She is on omega-3 fatty acids but otherwise no to medications.

## 2012-12-21 NOTE — Assessment & Plan Note (Signed)
Excellent control. No medications.

## 2013-01-14 ENCOUNTER — Emergency Department (INDEPENDENT_AMBULATORY_CARE_PROVIDER_SITE_OTHER): Payer: Medicare Other

## 2013-01-14 ENCOUNTER — Emergency Department (INDEPENDENT_AMBULATORY_CARE_PROVIDER_SITE_OTHER)
Admission: EM | Admit: 2013-01-14 | Discharge: 2013-01-14 | Disposition: A | Discharge status: Home / Self Care | Payer: Medicare Other | Source: Home / Self Care | Attending: Emergency Medicine | Admitting: Emergency Medicine

## 2013-01-14 ENCOUNTER — Encounter (HOSPITAL_COMMUNITY): Payer: Self-pay | Admitting: Emergency Medicine

## 2013-01-14 DIAGNOSIS — J40 Bronchitis, not specified as acute or chronic: Secondary | ICD-10-CM

## 2013-01-14 LAB — POCT RAPID STREP A: Streptococcus, Group A Screen (Direct): NEGATIVE

## 2013-01-14 MED ORDER — PREDNISONE 50 MG PO TABS: 50.0000 mg | ORAL_TABLET | Freq: Every day | ORAL | Status: DC

## 2013-01-14 MED ORDER — LEVOFLOXACIN 500 MG PO TABS: 500.0000 mg | ORAL_TABLET | Freq: Every day | ORAL | Status: DC

## 2013-01-14 MED ORDER — BENZONATATE 200 MG PO CAPS: 200.0000 mg | ORAL_CAPSULE | Freq: Three times a day (TID) | ORAL | Status: DC | PRN

## 2013-01-14 MED ORDER — AEROCHAMBER PLUS FLO-VU LARGE MISC: 1.0000 | Freq: Once | Status: DC

## 2013-01-14 MED ORDER — ALBUTEROL SULFATE HFA 108 (90 BASE) MCG/ACT IN AERS: 2.0000 | INHALATION_SPRAY | RESPIRATORY_TRACT | Status: DC | PRN

## 2013-01-14 NOTE — ED Provider Notes (Signed)
CSN: [id number]     Arrival date & time 01/14/13  1201 History   First MD Initiated Contact with Patient 01/14/13 1409     Chief Complaint  Patient presents with  . URI   (Consider location/radiation/quality/duration/timing/severity/associated sxs/prior Treatment) HPI Comments: 79 year old female presents complaining of sore throat and hoarseness, cough, mild subjective shortness of breath, wheezing at night, and chest pain with deep breath or coughing. This has been going on for about one week, getting progressively worse. Last night she rubbed Vicks vapor rub on her chest and she feels slightly better today than she did yesterday, but overall she feels still very sick. She has never felt this bad with a simple cold before. No fever at home, increase in her chronic leg swelling, nausea, vomiting, extremity numbness or weakness.  Patient is a 79 y.o. female presenting with URI.  URI Presenting symptoms: cough, fatigue, rhinorrhea and sore throat   Presenting symptoms: no fever   Associated symptoms: wheezing   Associated symptoms: no arthralgias and no myalgias     Past Medical History  Diagnosis Date  . Cystocele 2012  . Urge incontinence 2012  . Urinary frequency 2010  . GERD (gastroesophageal reflux disease)   . H/O hypercholesterolemia   . Hypertension   . H/O varicose veins   . Mitral valve prolapse   . H/O varicella   . Mumps   . Abnormal Pap smear 1975-76  . H/O osteopenia    Past Surgical History  Procedure Laterality Date  . Abdominal hysterectomy    . Nm myoview ltd  March 2009    Negative for ischemia or infarction  . Transthoracic echocardiogram  March 2009    Normal EF, mild aortic sclerosis with no stenosis.  . Lower extremity venous dopplers  01/09/2012    Right and left lower steroids: No evidence of thrombus or, thrombophlebitis; right and left GSV and SSV: No venous insufficiency. Normal exam.   No family history on file. History  Substance Use Topics   . Smoking status: Never Smoker   . Smokeless tobacco: Never Used  . Alcohol Use: No   OB History   Grav Para Term Preterm Abortions TAB SAB Ect Mult Living   3 3 3       3      Review of Systems  Constitutional: Positive for fatigue. Negative for fever and chills.  HENT: Positive for postnasal drip, rhinorrhea, sore throat and voice change.   Eyes: Negative for visual disturbance.  Respiratory: Positive for cough, chest tightness, shortness of breath and wheezing.   Cardiovascular: Positive for chest pain. Negative for palpitations and leg swelling.  Gastrointestinal: Negative for nausea, vomiting and abdominal pain.  Endocrine: Negative for polydipsia and polyuria.  Genitourinary: Negative for dysuria, urgency and frequency.  Musculoskeletal: Negative for arthralgias and myalgias.  Skin: Negative for rash.  Neurological: Negative for dizziness, weakness and light-headedness.    Allergies  Ivp dye  Home Medications   Current Outpatient Rx  Name  Route  Sig  Dispense  Refill  . albuterol (PROVENTIL HFA;VENTOLIN HFA) 108 (90 BASE) MCG/ACT inhaler   Inhalation   Inhale 2 puffs into the lungs every 4 (four) hours as needed for wheezing or shortness of breath.   1 Inhaler   0   . benzonatate (TESSALON) 200 MG capsule   Oral   Take 1 capsule (200 mg total) by mouth 3 (three) times daily as needed for cough.   20 capsule   0   .  CALCIUM CARBONATE PO   Oral   Take 1 tablet by mouth daily.         . chlorhexidine (PERIDEX) 0.12 % solution      as needed.         . cholecalciferol (VITAMIN D) 1000 UNITS tablet   Oral   Take 1,000 Units by mouth daily.         . Cyanocobalamin (VITAMIN B 12 PO)   Oral   Take 1 tablet by mouth 2 (two) times a week.          . fish oil-omega-3 fatty acids 1000 MG capsule   Oral   Take 1 g by mouth daily.          Marland Kitchen levofloxacin (LEVAQUIN) 500 MG tablet   Oral   Take 1 tablet (500 mg total) by mouth daily.   5 tablet    0   . Multiple Vitamin (MULTIVITAMIN WITH MINERALS) TABS   Oral   Take 1 tablet by mouth daily.         . predniSONE (DELTASONE) 50 MG tablet   Oral   Take 1 tablet (50 mg total) by mouth daily with breakfast.   5 tablet   0   . Spacer/Aero-Holding Chambers (AEROCHAMBER PLUS FLO-VU LARGE) MISC   Other   1 each by Other route once.   1 each   0    BP 111/58  Pulse 76  Temp(Src) 98.8 F (37.1 C) (Oral)  Resp 18  SpO2 99% Physical Exam  Nursing note and vitals reviewed. Constitutional: She is oriented to person, place, and time. Vital signs are normal. She appears well-developed and well-nourished. No distress.  Appears younger than age  HENT:  Head: Normocephalic and atraumatic.  Nose: Right sinus exhibits no maxillary sinus tenderness and no frontal sinus tenderness. Left sinus exhibits no maxillary sinus tenderness and no frontal sinus tenderness.  Mouth/Throat: Uvula is midline, oropharynx is clear and moist and mucous membranes are normal.  Neck: Trachea normal. Neck supple. No JVD present. Carotid bruit is not present. No tracheal deviation present.  Cardiovascular: Normal rate, regular rhythm, normal heart sounds and normal pulses.   No extrasystoles are present. Exam reveals no gallop.   Pulmonary/Chest: Effort normal. No accessory muscle usage. Not tachypneic. No respiratory distress. She has no decreased breath sounds. She has no wheezes. She has no rhonchi. She has rales in the right upper field.  Neurological: She is alert and oriented to person, place, and time. She has normal strength. Coordination normal.  Skin: Skin is warm and dry. No rash noted. She is not diaphoretic.  Psychiatric: She has a normal mood and affect. Judgment normal.    ED Course  Procedures (including critical care time) Labs Review Labs Reviewed  POCT RAPID STREP A ([facility] ONLY)   Imaging Review Dg Chest 2 View  01/14/2013   CLINICAL DATA:  Cough, wheezing, sore throat  EXAM:  CHEST  2 VIEW  COMPARISON:  03/15/2012; 09/29/2010; 04/21/2010; chest CT -04/21/2010  FINDINGS: Grossly unchanged borderline enlarged cardiac silhouette and mediastinal contours. The lungs remain hyperexpanded with flattening of the bilateral hemidiaphragms and mild diffuse slightly nodular thickening of the pulmonary interstitium. Grossly unchanged slightly asymmetric biapical pleural parenchymal thickening, right greater than left. No focal airspace opacities. No definite pleural effusion or pneumothorax. No evidence of edema. Grossly unchanged bones.  IMPRESSION: Lung hyperexpansion and bronchitic change without acute cardiopulmonary disease.   Electronically Signed   By: Jonny Ruiz  Watts M.D.   On: 01/14/2013 14:45      MDM   1. Bronchitis    Given the worsening of her symptoms, will treat with antibiotics in addition to symptomatic treatment. I have asked her to come back in for a recheck in 2 days. Note: This illness was preceded by laryngitis, if worsening still in 2 days may need CT scan to rule out laryngeal nerve palsy with subsequent postobstructive process  Meds ordered this encounter  Medications  . levofloxacin (LEVAQUIN) 500 MG tablet    Sig: Take 1 tablet (500 mg total) by mouth daily.    Dispense:  5 tablet    Refill:  0    Order Specific Question:  Supervising Provider    Answer:  Lorenz Coaster, DAVID C [id number]  . predniSONE (DELTASONE) 50 MG tablet    Sig: Take 1 tablet (50 mg total) by mouth daily with breakfast.    Dispense:  5 tablet    Refill:  0    Order Specific Question:  Supervising Provider    Answer:  Lorenz Coaster, DAVID C [id number]  . albuterol (PROVENTIL HFA;VENTOLIN HFA) 108 (90 BASE) MCG/ACT inhaler    Sig: Inhale 2 puffs into the lungs every 4 (four) hours as needed for wheezing or shortness of breath.    Dispense:  1 Inhaler    Refill:  0    Order Specific Question:  Supervising Provider    Answer:  Lorenz Coaster, DAVID C [id number]  . Spacer/Aero-Holding Chambers (AEROCHAMBER PLUS  FLO-VU LARGE) MISC    Sig: 1 each by Other route once.    Dispense:  1 each    Refill:  0    Order Specific Question:  Supervising Provider    Answer:  Lorenz Coaster, DAVID C [id number]  . benzonatate (TESSALON) 200 MG capsule    Sig: Take 1 capsule (200 mg total) by mouth 3 (three) times daily as needed for cough.    Dispense:  20 capsule    Refill:  0    Order Specific Question:  Supervising Provider    Answer:  Lorenz Coaster, DAVID C [[id number]]      Graylon Good, PA-C 01/14/13 1510

## 2013-01-14 NOTE — ED Notes (Signed)
Pt c/o cold sxs onset 1 week w/sxs that include: cough, ST, hoarseness, SOB, wheezing Denies: f/v/n/d... Taking OTC cough syrup w/no relief.  Alert w/no signs of acute distress.

## 2013-01-15 NOTE — ED Provider Notes (Signed)
Medical screening examination/treatment/procedure(s) were performed by non-physician practitioner and as supervising physician I was immediately available for consultation/collaboration.  Leslee Home, M.D.  Reuben Likes, MD 01/15/13 [phone]

## 2013-01-16 ENCOUNTER — Emergency Department (INDEPENDENT_AMBULATORY_CARE_PROVIDER_SITE_OTHER)
Admission: EM | Admit: 2013-01-16 | Discharge: 2013-01-16 | Disposition: A | Discharge status: Home / Self Care | Payer: Medicare Other | Source: Home / Self Care | Attending: Family Medicine | Admitting: Family Medicine

## 2013-01-16 ENCOUNTER — Encounter (HOSPITAL_COMMUNITY): Payer: Self-pay | Admitting: Emergency Medicine

## 2013-01-16 DIAGNOSIS — J40 Bronchitis, not specified as acute or chronic: Secondary | ICD-10-CM | POA: Diagnosis not present

## 2013-01-16 LAB — CULTURE, GROUP A STREP

## 2013-01-16 MED ORDER — HYDROCODONE-ACETAMINOPHEN 5-325 MG PO TABS: 0.5000 | ORAL_TABLET | Freq: Every evening | ORAL | Status: DC | PRN

## 2013-01-16 NOTE — ED Notes (Signed)
Follow up on cold sx States she is still cough which is causing mid area chest pain, has congestion, runny eyes and nose.   On 2nd day of antibiotic Denies diarrhea, vomiting, nausea

## 2013-01-16 NOTE — ED Provider Notes (Signed)
[name] is a 79 y.o. female who presents to Urgent Care today for followup cough. Patient was seen 2 days prior and diagnosed with bronchitis. She was treated with prednisone, Levaquin, benzoate for cough, and albuterol. She notes that she is not much better but not much worse. She denies any trouble breathing nausea vomiting or diarrhea. Albuterol only helped a bit. No significant shortness of breath. No fevers or chills nausea vomiting or diarrhea.   Past Medical History  Diagnosis Date  . Cystocele 2012  . Urge incontinence 2012  . Urinary frequency 2010  . GERD (gastroesophageal reflux disease)   . H/O hypercholesterolemia   . Hypertension   . H/O varicose veins   . Mitral valve prolapse   . H/O varicella   . Mumps   . Abnormal Pap smear 1975-76  . H/O osteopenia    History  Substance Use Topics  . Smoking status: Never Smoker   . Smokeless tobacco: Never Used  . Alcohol Use: No   ROS as above Medications reviewed. No current facility-administered medications for this encounter.   Current Outpatient Prescriptions  Medication Sig Dispense Refill  . albuterol (PROVENTIL HFA;VENTOLIN HFA) 108 (90 BASE) MCG/ACT inhaler Inhale 2 puffs into the lungs every 4 (four) hours as needed for wheezing or shortness of breath.  1 Inhaler  0  . benzonatate (TESSALON) 200 MG capsule Take 1 capsule (200 mg total) by mouth 3 (three) times daily as needed for cough.  20 capsule  0  . CALCIUM CARBONATE PO Take 1 tablet by mouth daily.      . chlorhexidine (PERIDEX) 0.12 % solution as needed.      . cholecalciferol (VITAMIN D) 1000 UNITS tablet Take 1,000 Units by mouth daily.      . Cyanocobalamin (VITAMIN B 12 PO) Take 1 tablet by mouth 2 (two) times a week.       . fish oil-omega-3 fatty acids 1000 MG capsule Take 1 g by mouth daily.       Marland Kitchen HYDROcodone-acetaminophen (NORCO/VICODIN) 5-325 MG per tablet Take 0.5 tablets by mouth at bedtime as needed (cough).  6 tablet  0  . levofloxacin  (LEVAQUIN) 500 MG tablet Take 1 tablet (500 mg total) by mouth daily.  5 tablet  0  . Multiple Vitamin (MULTIVITAMIN WITH MINERALS) TABS Take 1 tablet by mouth daily.      . predniSONE (DELTASONE) 50 MG tablet Take 1 tablet (50 mg total) by mouth daily with breakfast.  5 tablet  0  . Spacer/Aero-Holding Chambers (AEROCHAMBER PLUS FLO-VU LARGE) MISC 1 each by Other route once.  1 each  0    Exam:  BP 141/68  Pulse 73  Temp(Src) 98.2 F (36.8 C) (Oral)  Resp 18  SpO2 98% Gen: Well NAD HEENT: EOMI,  MMM Lungs: Normal work of breathing. CTABL Heart: RRR no MRG Abd: NABS, Soft. NT, ND Exts: Non edematous BL  LE, warm and well perfused.   No results found for this or any previous visit (from the past 24 hour(s)). Dg Chest 2 View  01/14/2013   CLINICAL DATA:  Cough, wheezing, sore throat  EXAM: CHEST  2 VIEW  COMPARISON:  03/15/2012; 09/29/2010; 04/21/2010; chest CT -04/21/2010  FINDINGS: Grossly unchanged borderline enlarged cardiac silhouette and mediastinal contours. The lungs remain hyperexpanded with flattening of the bilateral hemidiaphragms and mild diffuse slightly nodular thickening of the pulmonary interstitium. Grossly unchanged slightly asymmetric biapical pleural parenchymal thickening, right greater than left. No focal airspace opacities.  No definite pleural effusion or pneumothorax. No evidence of edema. Grossly unchanged bones.  IMPRESSION: Lung hyperexpansion and bronchitic change without acute cardiopulmonary disease.   Electronically Signed   By: Simonne Come M.D.   On: 01/14/2013 14:45    Assessment and Plan: 79 y.o. female with bronchitis. Patient appears to be stable. Will add hydrocodone-based cough medication at very low dose for use at nighttime as needed. Recommend continuing prednisone, Levaquin, and albuterol. Followup with primary care provider. Discussed warning signs or symptoms. Please see discharge instructions. Patient expresses understanding.      Rodolph Bong, MD 01/16/13 [phone]

## 2013-01-21 ENCOUNTER — Encounter (HOSPITAL_COMMUNITY): Payer: Self-pay | Admitting: Emergency Medicine

## 2013-01-21 ENCOUNTER — Emergency Department (HOSPITAL_COMMUNITY)
Admission: EM | Admit: 2013-01-21 | Discharge: 2013-01-21 | Disposition: A | Discharge status: Home / Self Care | Payer: Medicare Other | Attending: Emergency Medicine | Admitting: Emergency Medicine

## 2013-01-21 ENCOUNTER — Emergency Department (HOSPITAL_COMMUNITY): Payer: Medicare Other

## 2013-01-21 DIAGNOSIS — Z87448 Personal history of other diseases of urinary system: Secondary | ICD-10-CM | POA: Insufficient documentation

## 2013-01-21 DIAGNOSIS — R071 Chest pain on breathing: Secondary | ICD-10-CM | POA: Insufficient documentation

## 2013-01-21 DIAGNOSIS — R079 Chest pain, unspecified: Secondary | ICD-10-CM | POA: Diagnosis not present

## 2013-01-21 DIAGNOSIS — R0602 Shortness of breath: Secondary | ICD-10-CM | POA: Diagnosis not present

## 2013-01-21 DIAGNOSIS — Z8619 Personal history of other infectious and parasitic diseases: Secondary | ICD-10-CM | POA: Diagnosis not present

## 2013-01-21 DIAGNOSIS — Z79899 Other long term (current) drug therapy: Secondary | ICD-10-CM | POA: Insufficient documentation

## 2013-01-21 DIAGNOSIS — I10 Essential (primary) hypertension: Secondary | ICD-10-CM | POA: Insufficient documentation

## 2013-01-21 DIAGNOSIS — Z8639 Personal history of other endocrine, nutritional and metabolic disease: Secondary | ICD-10-CM | POA: Insufficient documentation

## 2013-01-21 DIAGNOSIS — Z8719 Personal history of other diseases of the digestive system: Secondary | ICD-10-CM | POA: Insufficient documentation

## 2013-01-21 DIAGNOSIS — R072 Precordial pain: Secondary | ICD-10-CM | POA: Diagnosis not present

## 2013-01-21 DIAGNOSIS — Z862 Personal history of diseases of the blood and blood-forming organs and certain disorders involving the immune mechanism: Secondary | ICD-10-CM | POA: Insufficient documentation

## 2013-01-21 LAB — BASIC METABOLIC PANEL
BUN: 26 mg/dL — ABNORMAL HIGH (ref 6–23)
CO2: 25 mEq/L (ref 19–32)
Calcium: 9.3 mg/dL (ref 8.4–10.5)
Chloride: 102 mEq/L (ref 96–112)
Creatinine, Ser: 1.03 mg/dL (ref 0.50–1.10)
GFR calc Af Amer: 57 mL/min — ABNORMAL LOW (ref >90)
GFR calc non Af Amer: 49 mL/min — ABNORMAL LOW (ref >90)
Glucose, Bld: 96 mg/dL (ref 70–99)
Potassium: 4 mEq/L (ref 3.7–5.3)
Sodium: 139 mEq/L (ref 137–147)

## 2013-01-21 LAB — CBC
HCT: 33.5 % — ABNORMAL LOW (ref 36.0–46.0)
Hemoglobin: 12.4 g/dL (ref 12.0–15.0)
MCH: 30.6 pg (ref 26.0–34.0)
MCHC: 37 g/dL — ABNORMAL HIGH (ref 30.0–36.0)
MCV: 82.7 fL (ref 78.0–100.0)
Platelets: 201 10*3/uL (ref 150–400)
RBC: 4.05 MIL/uL (ref 3.87–5.11)
RDW: 13.3 % (ref 11.5–15.5)
WBC: 10.7 10*3/uL — ABNORMAL HIGH (ref 4.0–10.5)

## 2013-01-21 LAB — PRO B NATRIURETIC PEPTIDE: Pro B Natriuretic peptide (BNP): 113.3 pg/mL (ref 0–450)

## 2013-01-21 LAB — D-DIMER, QUANTITATIVE (NOT AT ARMC): D-Dimer, Quant: 0.35 ug/mL-FEU (ref 0.00–0.48)

## 2013-01-21 LAB — TROPONIN I
Troponin I: <0.3 ng/mL (ref <0.30)
Troponin I: <0.3 ng/mL (ref <0.30)

## 2013-01-21 MED ORDER — ASPIRIN 81 MG PO CHEW
324.0000 mg | CHEWABLE_TABLET | Freq: Once | ORAL | Status: AC
  Administered 2013-01-21: 324 mg via ORAL
  Filled 2013-01-21: qty 4

## 2013-01-21 NOTE — ED Notes (Signed)
Pt c/o mid sternal CP and SOB x 3 days that is worse today; pt sts pain with inspiration; pt tachyonic at present

## 2013-01-21 NOTE — ED Notes (Signed)
Pt taken to room via wheelchair with husband in tow; pt at this time undressing and getting into a gown; husband at bedside; RN aware

## 2013-01-21 NOTE — ED Notes (Signed)
Pt. Given applesauce and [place] sand which.

## 2013-01-21 NOTE — Discharge Instructions (Signed)
If you were given medicines take as directed.  If you are on coumadin or contraceptives realize their levels and effectiveness is altered by many different medicines.  If you have any reaction (rash, tongues swelling, other) to the medicines stop taking and see a physician.   Please follow up as directed and return to the ER or see a physician for new or worsening symptoms.  Thank you. Call your cardiologist tomorrow for appointment this week.

## 2013-01-21 NOTE — ED Provider Notes (Signed)
CSN: [id number]     Arrival date & time 01/21/13  1305 History   First MD Initiated Contact with Patient 01/21/13 1400     Chief Complaint  Patient presents with  . Chest Pain  . Shortness of Breath   (Consider location/radiation/quality/duration/timing/severity/associated sxs/prior Treatment) Patient is a 80 y.o. female presenting with shortness of breath.  Shortness of Breath Severity:  Moderate Onset quality:  Sudden Duration:  3 hours Timing:  Constant Progression:  Unchanged Chronicity:  Recurrent (had a similar episode a few days ago, which lasted for a few hours and resolved.  ) Context comment:  Recent URI.  Finished course of levaquin, prednisone.   Relieved by:  Nothing Ineffective treatments:  Inhaler Associated symptoms: chest pain (substernal) and cough (improving)   Associated symptoms: no abdominal pain, no fever and no vomiting   Associated symptoms comment:  Lightheadedness   Past Medical History  Diagnosis Date  . Cystocele 2012  . Urge incontinence 2012  . Urinary frequency 2010  . GERD (gastroesophageal reflux disease)   . H/O hypercholesterolemia   . Hypertension   . H/O varicose veins   . Mitral valve prolapse   . H/O varicella   . Mumps   . Abnormal Pap smear 1975-76  . H/O osteopenia    Past Surgical History  Procedure Laterality Date  . Abdominal hysterectomy    . Nm myoview ltd  March 2009    Negative for ischemia or infarction  . Transthoracic echocardiogram  March 2009    Normal EF, mild aortic sclerosis with no stenosis.  . Lower extremity venous dopplers  01/09/2012    Right and left lower steroids: No evidence of thrombus or, thrombophlebitis; right and left GSV and SSV: No venous insufficiency. Normal exam.   History reviewed. No pertinent family history. History  Substance Use Topics  . Smoking status: Never Smoker   . Smokeless tobacco: Never Used  . Alcohol Use: No   OB History   Grav Para Term Preterm Abortions TAB SAB Ect  Mult Living   3 3 3       3      Review of Systems  Constitutional: Negative for fever.  HENT: Negative for congestion.   Respiratory: Positive for cough (improving) and shortness of breath.   Cardiovascular: Positive for chest pain (substernal).  Gastrointestinal: Negative for nausea, vomiting, abdominal pain and diarrhea.  All other systems reviewed and are negative.    Allergies  Ivp dye  Home Medications   Current Outpatient Rx  Name  Route  Sig  Dispense  Refill  . albuterol (PROVENTIL HFA;VENTOLIN HFA) 108 (90 BASE) MCG/ACT inhaler   Inhalation   Inhale 2 puffs into the lungs every 4 (four) hours as needed for wheezing or shortness of breath.   1 Inhaler   0   . benzonatate (TESSALON) 200 MG capsule   Oral   Take 1 capsule (200 mg total) by mouth 3 (three) times daily as needed for cough.   20 capsule   0   . CALCIUM CARBONATE PO   Oral   Take 1 tablet by mouth daily.         . cholecalciferol (VITAMIN D) 1000 UNITS tablet   Oral   Take 1,000 Units by mouth daily.         . Cyanocobalamin (VITAMIN B 12 PO)   Oral   Take 1 tablet by mouth daily.          . fish oil-omega-3 fatty acids  1000 MG capsule   Oral   Take 1 g by mouth daily.          . hydroxypropyl methylcellulose (ISOPTO TEARS) 2.5 % ophthalmic solution   Both Eyes   Place 1 drop into both eyes 4 (four) times daily as needed for dry eyes.         . Multiple Vitamin (MULTIVITAMIN WITH MINERALS) TABS   Oral   Take 1 tablet by mouth daily.          BP 128/63  Pulse 69  Temp(Src) 97.5 F (36.4 C) (Oral)  Resp 22  SpO2 96% Physical Exam  Nursing note and vitals reviewed. Constitutional: She is oriented to person, place, and time. She appears well-developed and well-nourished. No distress.  HENT:  Head: Normocephalic and atraumatic.  Mouth/Throat: Oropharynx is clear and moist.  Eyes: Conjunctivae are normal. Pupils are equal, round, and reactive to light. No scleral icterus.   Neck: Neck supple.  Cardiovascular: Normal rate, regular rhythm, normal heart sounds and intact distal pulses.   No murmur heard. Pulmonary/Chest: Breath sounds normal. No stridor. Tachypnea noted. She has no rales.  Abdominal: Soft. Bowel sounds are normal. She exhibits no distension. There is no tenderness.  Musculoskeletal: Normal range of motion.  Neurological: She is alert and oriented to person, place, and time.  Skin: Skin is warm and dry. No rash noted.  Psychiatric: She has a normal mood and affect. Her behavior is normal.    ED Course  Procedures (including critical care time) Labs Review Labs Reviewed  CBC - Abnormal; Notable for the following:    WBC 10.7 (*)    HCT 33.5 (*)    MCHC 37.0 (*)    All other components within normal limits  BASIC METABOLIC PANEL  PRO B NATRIURETIC PEPTIDE   Imaging Review Dg Chest 2 View  01/21/2013   CLINICAL DATA:  Mid chest pain and short of breath  EXAM: CHEST  2 VIEW  COMPARISON:  01/14/2013  FINDINGS: Heart size and vascularity are normal. Lungs are clear without infiltrate or effusion or mass lesion. No change from the prior study.  IMPRESSION: No active cardiopulmonary disease.   Electronically Signed   By: Franchot Gallo M.D.   On: 01/21/2013 13:45  All radiology studies independently viewed by me.     EKG Interpretation    Date/Time:  Monday January 21 2013 13:10:16 EST Ventricular Rate:  63 PR Interval:  132 QRS Duration: 76 QT Interval:  380 QTC Calculation: 388 R Axis:   52 Text Interpretation:  Normal sinus rhythm Normal ECG nonspecific t wave changes, new since most recent tracing Confirmed by [facility]  MD, TREY ([id number]) on 01/21/2013 2:00:10 PM            MDM   1. Shortness of breath    80 yo female with sudden onset shortness of breath.  She states she felt like she was going to pass out from weakness and SOB.  Also notes associated chest pain, which is less severe than the SOB.  Just finished course of Levaquin  for "bronchitis".  Her lungs are clear with good air movement.  However, she is somewhat tachypneic and appears slightly anxious.  CXR clear.  EKG shows some mild nonspecific changes in a single lead.    Initial workup is reassuring, including negative D Dimer (clinical picture is low risk for PE).  She stated she felt better with the nasal canula (although no oxygen was flowing).  All  in all, her presentation is very atypical for ACS, PE, or dissection.  No signs of pneumonia and she remains nontoxic.    I spoke with Dr. Claiborne Billings (Cardiology) to set up an outpatient treatment plan.  He will communicate with Dr. Ellyn Hack.  I think this is a reasonable approach given her atypical symptoms as long as a Delta troponin is negative.  Dr. Reather Converse will follow up on this test.    Houston Siren, MD 01/21/13 2012

## 2013-01-25 DIAGNOSIS — J209 Acute bronchitis, unspecified: Secondary | ICD-10-CM | POA: Diagnosis not present

## 2013-01-25 DIAGNOSIS — R05 Cough: Secondary | ICD-10-CM | POA: Diagnosis not present

## 2013-01-25 DIAGNOSIS — R059 Cough, unspecified: Secondary | ICD-10-CM | POA: Diagnosis not present

## 2013-03-13 ENCOUNTER — Encounter (INDEPENDENT_AMBULATORY_CARE_PROVIDER_SITE_OTHER): Payer: Medicare Other | Admitting: Ophthalmology

## 2013-03-13 DIAGNOSIS — H3530 Unspecified macular degeneration: Secondary | ICD-10-CM

## 2013-03-13 DIAGNOSIS — H43819 Vitreous degeneration, unspecified eye: Secondary | ICD-10-CM | POA: Diagnosis not present

## 2013-05-08 ENCOUNTER — Encounter (INDEPENDENT_AMBULATORY_CARE_PROVIDER_SITE_OTHER): Payer: Medicare Other | Admitting: Ophthalmology

## 2013-05-08 DIAGNOSIS — H43819 Vitreous degeneration, unspecified eye: Secondary | ICD-10-CM

## 2013-05-08 DIAGNOSIS — H3530 Unspecified macular degeneration: Secondary | ICD-10-CM | POA: Diagnosis not present

## 2013-05-13 DIAGNOSIS — H35039 Hypertensive retinopathy, unspecified eye: Secondary | ICD-10-CM | POA: Diagnosis not present

## 2013-05-13 DIAGNOSIS — H40019 Open angle with borderline findings, low risk, unspecified eye: Secondary | ICD-10-CM | POA: Diagnosis not present

## 2013-05-13 DIAGNOSIS — H353190 Nonexudative age-related macular degeneration, unspecified eye, stage unspecified: Secondary | ICD-10-CM | POA: Diagnosis not present

## 2013-05-13 DIAGNOSIS — H353290 Exudative age-related macular degeneration, unspecified eye, stage unspecified: Secondary | ICD-10-CM | POA: Diagnosis not present

## 2013-07-03 ENCOUNTER — Encounter (INDEPENDENT_AMBULATORY_CARE_PROVIDER_SITE_OTHER): Payer: Medicare Other | Admitting: Ophthalmology

## 2013-07-04 ENCOUNTER — Encounter (INDEPENDENT_AMBULATORY_CARE_PROVIDER_SITE_OTHER): Payer: Medicare Other | Admitting: Ophthalmology

## 2013-07-04 DIAGNOSIS — H3530 Unspecified macular degeneration: Secondary | ICD-10-CM | POA: Diagnosis not present

## 2013-07-04 DIAGNOSIS — H43819 Vitreous degeneration, unspecified eye: Secondary | ICD-10-CM

## 2013-07-04 DIAGNOSIS — H2700 Aphakia, unspecified eye: Secondary | ICD-10-CM

## 2013-07-30 DIAGNOSIS — J069 Acute upper respiratory infection, unspecified: Secondary | ICD-10-CM | POA: Diagnosis not present

## 2013-09-11 ENCOUNTER — Encounter (INDEPENDENT_AMBULATORY_CARE_PROVIDER_SITE_OTHER): Payer: Medicare Other | Admitting: Ophthalmology

## 2013-09-11 DIAGNOSIS — H3530 Unspecified macular degeneration: Secondary | ICD-10-CM

## 2013-09-11 DIAGNOSIS — H43819 Vitreous degeneration, unspecified eye: Secondary | ICD-10-CM

## 2013-09-18 DIAGNOSIS — M810 Age-related osteoporosis without current pathological fracture: Secondary | ICD-10-CM | POA: Diagnosis not present

## 2013-09-18 DIAGNOSIS — M899 Disorder of bone, unspecified: Secondary | ICD-10-CM | POA: Diagnosis not present

## 2013-09-18 DIAGNOSIS — N393 Stress incontinence (female) (male): Secondary | ICD-10-CM | POA: Diagnosis not present

## 2013-09-18 DIAGNOSIS — M949 Disorder of cartilage, unspecified: Secondary | ICD-10-CM | POA: Diagnosis not present

## 2013-09-18 DIAGNOSIS — Z0000 Encounter for general adult medical examination without abnormal findings: Secondary | ICD-10-CM | POA: Diagnosis not present

## 2013-09-18 DIAGNOSIS — Z124 Encounter for screening for malignant neoplasm of cervix: Secondary | ICD-10-CM | POA: Diagnosis not present

## 2013-10-07 ENCOUNTER — Telehealth: Payer: Self-pay | Admitting: Cardiology

## 2013-10-08 NOTE — Telephone Encounter (Signed)
Close encounter 

## 2013-10-10 DIAGNOSIS — Z1331 Encounter for screening for depression: Secondary | ICD-10-CM | POA: Diagnosis not present

## 2013-10-10 DIAGNOSIS — N183 Chronic kidney disease, stage 3 unspecified: Secondary | ICD-10-CM | POA: Diagnosis not present

## 2013-10-10 DIAGNOSIS — K589 Irritable bowel syndrome without diarrhea: Secondary | ICD-10-CM | POA: Diagnosis not present

## 2013-10-10 DIAGNOSIS — M810 Age-related osteoporosis without current pathological fracture: Secondary | ICD-10-CM | POA: Diagnosis not present

## 2013-10-10 DIAGNOSIS — Z136 Encounter for screening for cardiovascular disorders: Secondary | ICD-10-CM | POA: Diagnosis not present

## 2013-10-10 DIAGNOSIS — N318 Other neuromuscular dysfunction of bladder: Secondary | ICD-10-CM | POA: Diagnosis not present

## 2013-10-10 DIAGNOSIS — Z23 Encounter for immunization: Secondary | ICD-10-CM | POA: Diagnosis not present

## 2013-10-30 DIAGNOSIS — Z23 Encounter for immunization: Secondary | ICD-10-CM | POA: Diagnosis not present

## 2013-11-18 ENCOUNTER — Encounter (HOSPITAL_COMMUNITY): Payer: Self-pay | Admitting: Emergency Medicine

## 2013-11-19 DIAGNOSIS — R1013 Epigastric pain: Secondary | ICD-10-CM | POA: Diagnosis not present

## 2013-12-03 ENCOUNTER — Other Ambulatory Visit: Payer: Self-pay | Admitting: Gastroenterology

## 2013-12-03 DIAGNOSIS — R1084 Generalized abdominal pain: Secondary | ICD-10-CM | POA: Diagnosis not present

## 2013-12-03 DIAGNOSIS — M6530 Trigger finger, unspecified finger: Secondary | ICD-10-CM | POA: Diagnosis not present

## 2013-12-03 DIAGNOSIS — R634 Abnormal weight loss: Secondary | ICD-10-CM

## 2013-12-03 DIAGNOSIS — R109 Unspecified abdominal pain: Secondary | ICD-10-CM

## 2013-12-03 DIAGNOSIS — Z1231 Encounter for screening mammogram for malignant neoplasm of breast: Secondary | ICD-10-CM | POA: Diagnosis not present

## 2013-12-04 ENCOUNTER — Encounter (INDEPENDENT_AMBULATORY_CARE_PROVIDER_SITE_OTHER): Payer: Medicare Other | Admitting: Ophthalmology

## 2013-12-04 DIAGNOSIS — H3531 Nonexudative age-related macular degeneration: Secondary | ICD-10-CM | POA: Diagnosis not present

## 2013-12-04 DIAGNOSIS — H43813 Vitreous degeneration, bilateral: Secondary | ICD-10-CM | POA: Diagnosis not present

## 2013-12-09 ENCOUNTER — Other Ambulatory Visit: Payer: Medicare Other

## 2013-12-09 DIAGNOSIS — R634 Abnormal weight loss: Secondary | ICD-10-CM | POA: Diagnosis not present

## 2013-12-09 DIAGNOSIS — R109 Unspecified abdominal pain: Secondary | ICD-10-CM | POA: Diagnosis not present

## 2013-12-09 DIAGNOSIS — R1013 Epigastric pain: Secondary | ICD-10-CM | POA: Diagnosis not present

## 2013-12-09 DIAGNOSIS — R1084 Generalized abdominal pain: Secondary | ICD-10-CM | POA: Diagnosis not present

## 2013-12-23 ENCOUNTER — Ambulatory Visit (INDEPENDENT_AMBULATORY_CARE_PROVIDER_SITE_OTHER): Payer: Medicare Other | Admitting: Cardiology

## 2013-12-23 ENCOUNTER — Encounter: Payer: Self-pay | Admitting: Cardiology

## 2013-12-23 VITALS — BP 112/62 | HR 80 | Ht 64.0 in | Wt 141.0 lb

## 2013-12-23 DIAGNOSIS — I10 Essential (primary) hypertension: Secondary | ICD-10-CM

## 2013-12-23 DIAGNOSIS — E7800 Pure hypercholesterolemia, unspecified: Secondary | ICD-10-CM

## 2013-12-23 DIAGNOSIS — R600 Localized edema: Secondary | ICD-10-CM

## 2013-12-23 DIAGNOSIS — R200 Anesthesia of skin: Secondary | ICD-10-CM

## 2013-12-23 DIAGNOSIS — R030 Elevated blood-pressure reading, without diagnosis of hypertension: Secondary | ICD-10-CM

## 2013-12-23 DIAGNOSIS — Z8639 Personal history of other endocrine, nutritional and metabolic disease: Secondary | ICD-10-CM

## 2013-12-23 DIAGNOSIS — R0609 Other forms of dyspnea: Secondary | ICD-10-CM

## 2013-12-23 DIAGNOSIS — R2689 Other abnormalities of gait and mobility: Secondary | ICD-10-CM | POA: Diagnosis not present

## 2013-12-23 DIAGNOSIS — R202 Paresthesia of skin: Secondary | ICD-10-CM

## 2013-12-23 DIAGNOSIS — R0600 Dyspnea, unspecified: Secondary | ICD-10-CM

## 2013-12-23 NOTE — Patient Instructions (Signed)
Dr Ellyn Hack has ordered the following test(s) to be done: 1. FASTING blood work  Dr Ellyn Hack wants you to follow-up in 1 year. You will receive a reminder letter in the mail one months in advance. If you don't receive a letter, please call our office to schedule the follow-up appointment.

## 2013-12-23 NOTE — Progress Notes (Signed)
PATIENTMarland Kitchen [name] MRN: [id number]  DOB: [birth year]   DOV:12/24/2013 PCP: Irven Shelling, MD  Clinic Note: Chief Complaint  Patient presents with  . Annual Exam    C/o shortness of breath on exertion, unexplained weight loss.    HPI: [name] is a 80 y.o.  female with a PMH below who presents today for annual followup for general cardiology evaluation. As you recall, she is a very pleasant 80 year old woman who is a long-term patient of Dr. Ky Barban  With essentially a negative cardiac evaluation to date. See history.  She comes in today. continuing to do well. Her major concern today is mild exertional dyspnea and some numbness in her left arm. She also is very concerned about unexplained weight loss despite the fact that looks at her weight is the same on our scales. Since her last visit, she has not had any more of the edema. Essentially left leg swelling is all gone.Marland Kitchen  Despite her complaints,  at baseline she is able walk he was admitted to a 2 miles. She denies any symptoms of chest tightness or pressure or dyspnea with rest or exertion. No PND, orthopnea.  No lightheadedness, dizziness, wooziness, syncope or near syncope. No TIA or amaurosis fugax symptoms. No melena, hematochezia or hematuria, and no claudication.   Past Medical History  Diagnosis Date  . Cystocele 2012  . Urge incontinence 2012  . Urinary frequency 2010  . GERD (gastroesophageal reflux disease)   . H/O hypercholesterolemia   . Hypertension   . H/O varicose veins   . Mitral valve prolapse   . H/O varicella   . Mumps   . Abnormal Pap smear 1975-76  . H/O osteopenia     Prior Cardiac Evaluation and Past Surgical History: Past Surgical History  Procedure Laterality Date  . Abdominal hysterectomy    . Nm myoview ltd  March 2009    Negative for ischemia or infarction  . Transthoracic echocardiogram  March 2009    Normal EF, mild aortic sclerosis with no stenosis.  . Lower extremity venous  dopplers  01/09/2012    Right and left lower steroids: No evidence of thrombus or, thrombophlebitis; right and left GSV and SSV: No venous insufficiency. Normal exam.    Allergies  Allergen Reactions  . Ivp Dye [Iodinated Diagnostic Agents] Hives    Current Outpatient Prescriptions  Medication Sig Dispense Refill  . albuterol (PROVENTIL HFA;VENTOLIN HFA) 108 (90 BASE) MCG/ACT inhaler Inhale 2 puffs into the lungs every 4 (four) hours as needed for wheezing or shortness of breath. 1 Inhaler 0  . CALCIUM CARBONATE PO Take 1 tablet by mouth daily.    . Cyanocobalamin (VITAMIN B 12 PO) Take 1 tablet by mouth daily.     . hydroxypropyl methylcellulose (ISOPTO TEARS) 2.5 % ophthalmic solution Place 1 drop into both eyes 4 (four) times daily as needed for dry eyes.    . Multiple Vitamin (MULTIVITAMIN WITH MINERALS) TABS Take 1 tablet by mouth daily.     No current facility-administered medications for this visit.    History   Social History Narrative   She is a married mother of 10, grandmother of 18, great grandmother of 71. She walks   daily about 2 miles a day, does not smoke, does not drink.     ROS: A comprehensive Review of Systems -was performed Review of Systems  Constitutional: Positive for weight loss.  HENT: Positive for congestion (off & on allergies). Negative for nosebleeds.  Respiratory: Negative for cough and wheezing.   Cardiovascular: Positive for leg swelling (L leg swelling essentially gone).  Gastrointestinal: Negative for blood in stool and melena.  Genitourinary: Negative for hematuria.  Musculoskeletal: Negative for myalgias.       Nighttime leg cramps ; Occasionally wakes up with ar numbness.  Neurological: Positive for tingling.       Unsteady gait - poor balance.  Endo/Heme/Allergies: Negative.  Does not bruise/bleed easily.  Psychiatric/Behavioral: Negative for depression. The patient is not nervous/anxious and does not have insomnia.   All other systems  reviewed and are negative.   Wt Readings from Last 3 Encounters:  12/23/13 141 lb (63.957 kg)  12/18/12 144 lb 6.4 oz (65.499 kg)  09/22/11 144 lb (65.318 kg)  ~ 8 lb loss by her report  PHYSICAL EXAM BP 112/62 mmHg  Pulse 80  Ht 5\' 4"  (1.626 m)  Wt 141 lb (63.957 kg)  BMI 24.19 kg/m2 General: She is a very pleasant, healthy appearing woman looking much younger than her stated age. well groomed, well nourished.A&Ox3, answers questions appropriately. NAD. normal mood and affect.  HEENT: NCAT, EOMI, MMM. Anicteric sclerae.  Neck: Supple, no LAN, JVD or carotid bruit.  Heart: RRR, normal S1, S2, no rubs or gallops, but she does have a soft SEM @ RUSB; no R/G; Nondisplaced PMI. Lungs: CTAB, nonlabored, normal effort, good air movement.  Abdomen: Soft/NT/ND/NABS.  Extremities: No C/C/E.   CWU:GQBVQXIHW today: Yes Rate:  80, Rhythm:  NSR; normal ECG  Recent Labs:  None  ASSESSMENT / PLAN: Lorrain is a very pleasant elderly woman who icontinues to be  quite healthy for her age. She does not have any major problems to speak of, with the exception of mild exertional dyspnea in the setting of having congestion.  Her blood pressure is well-controlled as is her lipids Without any medicationsWith using support hose, her whole leg edema has much improved.he has a mild cold symptoms the day but otherwise is doing fine. I don't think she needs any additional cardiac evaluation at this time. She simply fine for her annual followup.  She is concerned about weight loss, night cramps, poor balance and some numbness in her arm when waking up.  Patient Active Problem List   Diagnosis Date Noted  . Essential hypertension; borderline     Priority: Medium  . H/O hypercholesterolemia     Priority: Medium  . Exertional dyspnea 12/24/2013    Priority: Low  . Bilateral arm numbness and tingling while sleeping - and shortly after waking 12/24/2013    Priority: Low  . Poor balance 12/24/2013    Priority:  Low  . Bilateral edema of lower extremity 12/21/2012    Priority: Low  . Osteoporosis - of the spine 09/22/2011   Bilateral edema of lower extremity Essentially resolved. Continue support stockings  Essential hypertension; borderline Continues to be well controlled on the medications.  Exertional dyspnea She mentioned this symptom on check in evaluation, really did not, thought to be she is still active, would like to do more than she has been able to do. She's not having any heart failure symptoms of PND, orthopnea with only minimal intermittent edema. She's not had any anginal symptoms. At this point it seems to be more of a background symptoms, potentially related to deconditioning. Otherwise she is doing relatively well until she needs additional evaluation, unless she has worsening episodes.  H/O hypercholesterolemia Only taking omega-3 fatty acids occasionally at home. Labs monitored by PCP.  Bilateral arm numbness and tingling while sleeping - and shortly after waking The symptoms she describes is really consistent with ulnar nerve impingement either peripherally or proximally. She is not having signs or symptoms of upper extremity claudication.  Poor balance He is not describing symptoms of vertigo, nor orthostatic symptoms. It just seems that is more vertebrobasilar insufficiency vs. inner ear related. Unlikely related to a cardiac etiology, however fluctuant blood pressure control could be partially responsible.   Orders Placed This Encounter  Procedures  . Lipid panel  . EKG 12-Lead   Followup: annual followup.   Leonie Man, M.D., M.S. Interventional Cardiologist   Pager # [phone]

## 2013-12-24 ENCOUNTER — Encounter: Payer: Self-pay | Admitting: Cardiology

## 2013-12-24 DIAGNOSIS — R200 Anesthesia of skin: Secondary | ICD-10-CM | POA: Insufficient documentation

## 2013-12-24 DIAGNOSIS — R2689 Other abnormalities of gait and mobility: Secondary | ICD-10-CM | POA: Insufficient documentation

## 2013-12-24 DIAGNOSIS — R0600 Dyspnea, unspecified: Secondary | ICD-10-CM | POA: Insufficient documentation

## 2013-12-24 DIAGNOSIS — R202 Paresthesia of skin: Secondary | ICD-10-CM

## 2013-12-24 DIAGNOSIS — R0609 Other forms of dyspnea: Secondary | ICD-10-CM | POA: Insufficient documentation

## 2013-12-24 NOTE — Assessment & Plan Note (Signed)
He is not describing symptoms of vertigo, nor orthostatic symptoms. It just seems that is more vertebrobasilar insufficiency vs. inner ear related. Unlikely related to a cardiac etiology, however fluctuant blood pressure control could be partially responsible.

## 2013-12-24 NOTE — Assessment & Plan Note (Signed)
Continues to be well controlled on the medications.

## 2013-12-24 NOTE — Assessment & Plan Note (Signed)
She mentioned this symptom on check in evaluation, really did not, thought to be she is still active, would like to do more than she has been able to do. She's not having any heart failure symptoms of PND, orthopnea with only minimal intermittent edema. She's not had any anginal symptoms. At this point it seems to be more of a background symptoms, potentially related to deconditioning. Otherwise she is doing relatively well until she needs additional evaluation, unless she has worsening episodes.

## 2013-12-24 NOTE — Assessment & Plan Note (Addendum)
Only taking omega-3 fatty acids occasionally at home. To ensure that she has recent labs to evaluate her lipids, we will order for now.

## 2013-12-24 NOTE — Assessment & Plan Note (Signed)
Essentially resolved. Continue support stockings

## 2013-12-24 NOTE — Assessment & Plan Note (Signed)
The symptoms she describes is really consistent with ulnar nerve impingement either peripherally or proximally. She is not having signs or symptoms of upper extremity claudication.

## 2013-12-26 DIAGNOSIS — E780 Pure hypercholesterolemia: Secondary | ICD-10-CM | POA: Diagnosis not present

## 2013-12-26 DIAGNOSIS — R030 Elevated blood-pressure reading, without diagnosis of hypertension: Secondary | ICD-10-CM | POA: Diagnosis not present

## 2013-12-26 LAB — LIPID PANEL
Cholesterol: 185 mg/dL (ref 0–200)
HDL: 50 mg/dL (ref >39)
LDL Cholesterol: 121 mg/dL — ABNORMAL HIGH (ref 0–99)
Total CHOL/HDL Ratio: 3.7 Ratio
Triglycerides: 71 mg/dL (ref <150)
VLDL: 14 mg/dL (ref 0–40)

## 2014-01-02 ENCOUNTER — Telehealth: Payer: Self-pay | Admitting: *Deleted

## 2014-01-02 ENCOUNTER — Encounter: Payer: Self-pay | Admitting: *Deleted

## 2014-01-02 NOTE — Telephone Encounter (Signed)
-----   Message from Leonie Man, MD sent at 01/02/2014 12:02 AM EST ----- Labs look pretty stable. LDL is not quite as good as it had been Overall it is okay.  [place]

## 2014-01-02 NOTE — Telephone Encounter (Signed)
No answer on home phone.

## 2014-01-21 DIAGNOSIS — M65341 Trigger finger, right ring finger: Secondary | ICD-10-CM | POA: Diagnosis not present

## 2014-02-14 DIAGNOSIS — R151 Fecal smearing: Secondary | ICD-10-CM | POA: Diagnosis not present

## 2014-02-14 DIAGNOSIS — R110 Nausea: Secondary | ICD-10-CM | POA: Diagnosis not present

## 2014-02-18 DIAGNOSIS — M65341 Trigger finger, right ring finger: Secondary | ICD-10-CM | POA: Diagnosis not present

## 2014-02-24 ENCOUNTER — Ambulatory Visit
Admission: RE | Admit: 2014-02-24 | Discharge: 2014-02-24 | Disposition: A | Discharge status: Home / Self Care | Payer: Medicare Other | Source: Ambulatory Visit | Attending: Gastroenterology | Admitting: Gastroenterology

## 2014-02-24 DIAGNOSIS — R634 Abnormal weight loss: Secondary | ICD-10-CM

## 2014-02-24 DIAGNOSIS — R109 Unspecified abdominal pain: Secondary | ICD-10-CM | POA: Diagnosis not present

## 2014-03-12 ENCOUNTER — Encounter (INDEPENDENT_AMBULATORY_CARE_PROVIDER_SITE_OTHER): Payer: Medicare Other | Admitting: Ophthalmology

## 2014-03-18 DIAGNOSIS — M65341 Trigger finger, right ring finger: Secondary | ICD-10-CM | POA: Diagnosis not present

## 2014-03-19 ENCOUNTER — Encounter (INDEPENDENT_AMBULATORY_CARE_PROVIDER_SITE_OTHER): Payer: Medicare Other | Admitting: Ophthalmology

## 2014-03-19 DIAGNOSIS — H43813 Vitreous degeneration, bilateral: Secondary | ICD-10-CM

## 2014-03-19 DIAGNOSIS — H3531 Nonexudative age-related macular degeneration: Secondary | ICD-10-CM

## 2014-04-21 DIAGNOSIS — H3532 Exudative age-related macular degeneration: Secondary | ICD-10-CM | POA: Diagnosis not present

## 2014-04-21 DIAGNOSIS — H35033 Hypertensive retinopathy, bilateral: Secondary | ICD-10-CM | POA: Diagnosis not present

## 2014-04-21 DIAGNOSIS — H3531 Nonexudative age-related macular degeneration: Secondary | ICD-10-CM | POA: Diagnosis not present

## 2014-04-21 DIAGNOSIS — H40013 Open angle with borderline findings, low risk, bilateral: Secondary | ICD-10-CM | POA: Diagnosis not present

## 2014-04-28 DIAGNOSIS — M9904 Segmental and somatic dysfunction of sacral region: Secondary | ICD-10-CM | POA: Diagnosis not present

## 2014-04-28 DIAGNOSIS — M9903 Segmental and somatic dysfunction of lumbar region: Secondary | ICD-10-CM | POA: Diagnosis not present

## 2014-04-28 DIAGNOSIS — M5136 Other intervertebral disc degeneration, lumbar region: Secondary | ICD-10-CM | POA: Diagnosis not present

## 2014-04-28 DIAGNOSIS — M9905 Segmental and somatic dysfunction of pelvic region: Secondary | ICD-10-CM | POA: Diagnosis not present

## 2014-04-29 DIAGNOSIS — M545 Low back pain: Secondary | ICD-10-CM | POA: Diagnosis not present

## 2014-04-29 DIAGNOSIS — M5441 Lumbago with sciatica, right side: Secondary | ICD-10-CM | POA: Diagnosis not present

## 2014-04-29 DIAGNOSIS — M25551 Pain in right hip: Secondary | ICD-10-CM | POA: Diagnosis not present

## 2014-04-30 ENCOUNTER — Other Ambulatory Visit: Payer: Self-pay | Admitting: Orthopedic Surgery

## 2014-04-30 DIAGNOSIS — M545 Low back pain: Secondary | ICD-10-CM

## 2014-05-01 ENCOUNTER — Ambulatory Visit
Admission: RE | Admit: 2014-05-01 | Discharge: 2014-05-01 | Disposition: A | Discharge status: Home / Self Care | Payer: Medicare Other | Source: Ambulatory Visit | Attending: Orthopedic Surgery | Admitting: Orthopedic Surgery

## 2014-05-01 DIAGNOSIS — M5136 Other intervertebral disc degeneration, lumbar region: Secondary | ICD-10-CM | POA: Diagnosis not present

## 2014-05-01 DIAGNOSIS — M47817 Spondylosis without myelopathy or radiculopathy, lumbosacral region: Secondary | ICD-10-CM | POA: Diagnosis not present

## 2014-05-01 DIAGNOSIS — M4806 Spinal stenosis, lumbar region: Secondary | ICD-10-CM | POA: Diagnosis not present

## 2014-05-01 DIAGNOSIS — M545 Low back pain: Secondary | ICD-10-CM

## 2014-05-07 DIAGNOSIS — M47896 Other spondylosis, lumbar region: Secondary | ICD-10-CM | POA: Diagnosis not present

## 2014-05-07 DIAGNOSIS — M4800 Spinal stenosis, site unspecified: Secondary | ICD-10-CM | POA: Diagnosis not present

## 2014-06-03 DIAGNOSIS — M5489 Other dorsalgia: Secondary | ICD-10-CM | POA: Diagnosis not present

## 2014-07-08 DIAGNOSIS — L818 Other specified disorders of pigmentation: Secondary | ICD-10-CM | POA: Diagnosis not present

## 2014-07-08 DIAGNOSIS — D225 Melanocytic nevi of trunk: Secondary | ICD-10-CM | POA: Diagnosis not present

## 2014-07-08 DIAGNOSIS — L659 Nonscarring hair loss, unspecified: Secondary | ICD-10-CM | POA: Diagnosis not present

## 2014-07-30 ENCOUNTER — Encounter (INDEPENDENT_AMBULATORY_CARE_PROVIDER_SITE_OTHER): Payer: Medicare Other | Admitting: Ophthalmology

## 2014-07-30 DIAGNOSIS — H3531 Nonexudative age-related macular degeneration: Secondary | ICD-10-CM

## 2014-07-30 DIAGNOSIS — H43813 Vitreous degeneration, bilateral: Secondary | ICD-10-CM | POA: Diagnosis not present

## 2014-08-11 DIAGNOSIS — M65341 Trigger finger, right ring finger: Secondary | ICD-10-CM | POA: Diagnosis not present

## 2014-08-20 DIAGNOSIS — M65341 Trigger finger, right ring finger: Secondary | ICD-10-CM | POA: Diagnosis not present

## 2014-09-23 DIAGNOSIS — M79641 Pain in right hand: Secondary | ICD-10-CM | POA: Diagnosis not present

## 2014-09-23 DIAGNOSIS — G5601 Carpal tunnel syndrome, right upper limb: Secondary | ICD-10-CM | POA: Diagnosis not present

## 2014-09-24 DIAGNOSIS — N3281 Overactive bladder: Secondary | ICD-10-CM | POA: Diagnosis not present

## 2014-09-24 DIAGNOSIS — Z6825 Body mass index (BMI) 25.0-25.9, adult: Secondary | ICD-10-CM | POA: Diagnosis not present

## 2014-09-24 DIAGNOSIS — Z01419 Encounter for gynecological examination (general) (routine) without abnormal findings: Secondary | ICD-10-CM | POA: Diagnosis not present

## 2014-09-24 DIAGNOSIS — R7309 Other abnormal glucose: Secondary | ICD-10-CM | POA: Diagnosis not present

## 2014-09-24 DIAGNOSIS — E782 Mixed hyperlipidemia: Secondary | ICD-10-CM | POA: Diagnosis not present

## 2014-09-24 DIAGNOSIS — Z139 Encounter for screening, unspecified: Secondary | ICD-10-CM | POA: Diagnosis not present

## 2014-09-24 DIAGNOSIS — R739 Hyperglycemia, unspecified: Secondary | ICD-10-CM | POA: Diagnosis not present

## 2014-10-17 DIAGNOSIS — Z0000 Encounter for general adult medical examination without abnormal findings: Secondary | ICD-10-CM | POA: Diagnosis not present

## 2014-10-17 DIAGNOSIS — N183 Chronic kidney disease, stage 3 (moderate): Secondary | ICD-10-CM | POA: Diagnosis not present

## 2014-10-17 DIAGNOSIS — Z1389 Encounter for screening for other disorder: Secondary | ICD-10-CM | POA: Diagnosis not present

## 2014-10-17 DIAGNOSIS — L659 Nonscarring hair loss, unspecified: Secondary | ICD-10-CM | POA: Diagnosis not present

## 2014-10-27 DIAGNOSIS — Z23 Encounter for immunization: Secondary | ICD-10-CM | POA: Diagnosis not present

## 2014-11-25 DIAGNOSIS — H353212 Exudative age-related macular degeneration, right eye, with inactive choroidal neovascularization: Secondary | ICD-10-CM | POA: Diagnosis not present

## 2014-11-25 DIAGNOSIS — H40013 Open angle with borderline findings, low risk, bilateral: Secondary | ICD-10-CM | POA: Diagnosis not present

## 2014-11-25 DIAGNOSIS — H353120 Nonexudative age-related macular degeneration, left eye, stage unspecified: Secondary | ICD-10-CM | POA: Diagnosis not present

## 2014-11-25 DIAGNOSIS — H35033 Hypertensive retinopathy, bilateral: Secondary | ICD-10-CM | POA: Diagnosis not present

## 2014-12-03 ENCOUNTER — Encounter (INDEPENDENT_AMBULATORY_CARE_PROVIDER_SITE_OTHER): Payer: Medicare Other | Admitting: Ophthalmology

## 2014-12-03 DIAGNOSIS — H353211 Exudative age-related macular degeneration, right eye, with active choroidal neovascularization: Secondary | ICD-10-CM | POA: Diagnosis not present

## 2014-12-03 DIAGNOSIS — H353124 Nonexudative age-related macular degeneration, left eye, advanced atrophic with subfoveal involvement: Secondary | ICD-10-CM

## 2014-12-03 DIAGNOSIS — H43813 Vitreous degeneration, bilateral: Secondary | ICD-10-CM

## 2014-12-17 DIAGNOSIS — Z1231 Encounter for screening mammogram for malignant neoplasm of breast: Secondary | ICD-10-CM | POA: Diagnosis not present

## 2014-12-23 ENCOUNTER — Ambulatory Visit (INDEPENDENT_AMBULATORY_CARE_PROVIDER_SITE_OTHER): Payer: Medicare Other | Admitting: Cardiology

## 2014-12-23 VITALS — BP 118/64 | HR 75 | Ht 64.0 in | Wt 142.3 lb

## 2014-12-23 DIAGNOSIS — R0989 Other specified symptoms and signs involving the circulatory and respiratory systems: Secondary | ICD-10-CM

## 2014-12-23 DIAGNOSIS — Z79899 Other long term (current) drug therapy: Secondary | ICD-10-CM | POA: Diagnosis not present

## 2014-12-23 DIAGNOSIS — I10 Essential (primary) hypertension: Secondary | ICD-10-CM | POA: Diagnosis not present

## 2014-12-23 DIAGNOSIS — R600 Localized edema: Secondary | ICD-10-CM

## 2014-12-23 DIAGNOSIS — R609 Edema, unspecified: Secondary | ICD-10-CM | POA: Diagnosis not present

## 2014-12-23 DIAGNOSIS — Z8639 Personal history of other endocrine, nutritional and metabolic disease: Secondary | ICD-10-CM | POA: Diagnosis not present

## 2014-12-23 NOTE — Progress Notes (Signed)
PCP: Irven Shelling, MD  Clinic Note: Chief Complaint  Patient presents with  . Annual Exam    no light headedness or dizziness  . Chest Pain    pt states no chest pain   . Edema    no edema   . Shortness of Breath    no SOB    HPI: [name] is a 81 y.o. female with a PMH below who presents today for annual f/u HTN & reported mitral valve prolapse. Former Dr. Melvern Banker patient -- negative Cardiac History.   Dorna C Michael was last seen in Dev 2015 - doing well  Recent Hospitalizations: none  Studies Reviewed: n/a  Interval History: She presents today again with no cardiac complaints. Remains active & only limited by a little bit of arthritis.   Has intermitted bouts of GERD/dyspepsia -- 1 episode of Indigestion last week - better with TUMS & water (started shortly after lying down in bed).  Not a very common occurrence. Stays on the go - walks ~2 miles /day - outside.    No chest pain or shortness of breath with rest or exertion. No PND, orthopnea or edema. No palpitations, lightheadedness, dizziness, weakness or syncope/near syncope. No TIA/amaurosis fugax symptoms. No melena, hematochezia, hematuria, or epstaxis. No claudication.  ROS: A comprehensive was performed. Review of Systems  Cardiovascular: Negative for claudication.  Gastrointestinal: Negative for constipation, blood in stool and melena.  Genitourinary: Negative for hematuria.  Musculoskeletal: Negative for myalgias.  Neurological: Negative for dizziness.  Psychiatric/Behavioral: The patient is not nervous/anxious and does not have insomnia.   All other systems reviewed and are negative.    Past Medical History  Diagnosis Date  . Cystocele 2012  . Urge incontinence 2012  . Urinary frequency 2010  . GERD (gastroesophageal reflux disease)   . H/O hypercholesterolemia   . Hypertension   . H/O varicose veins   . Mitral valve prolapse   . H/O varicella   . Mumps   . Abnormal Pap smear  1975-76  . H/O osteopenia     Past Surgical History  Procedure Laterality Date  . Abdominal hysterectomy    . Nm myoview ltd  March 2009    Negative for ischemia or infarction  . Transthoracic echocardiogram  March 2009    Normal EF, mild aortic sclerosis with no stenosis.  . Lower extremity venous dopplers  01/09/2012    Right and left lower steroids: No evidence of thrombus or, thrombophlebitis; right and left GSV and SSV: No venous insufficiency. Normal exam.   Prior to Admission medications   Medication Sig Start Date End Date Taking? Authorizing Provider  CALCIUM CARBONATE PO Take 1 tablet by mouth as needed.    Yes Historical Provider, MD  cholecalciferol (VITAMIN D) 400 UNITS TABS tablet Take 400 Units by mouth daily.   Yes Historical Provider, MD  Cyanocobalamin (VITAMIN B 12 PO) Take 1 tablet by mouth as needed.    Yes Historical Provider, MD  Multiple Vitamin (MULTIVITAMIN WITH MINERALS) TABS Take 1 tablet by mouth daily.   Yes Historical Provider, MD   Allergies  Allergen Reactions  . Ivp Dye [Iodinated Diagnostic Agents] Hives    Social History   Social History  . Marital Status: Married    Spouse Name: N/A  . Number of Children: N/A  . Years of Education: N/A   Social History Main Topics  . Smoking status: Never Smoker   . Smokeless tobacco: Never Used  . Alcohol Use: No  .  Drug Use: No  . Sexual Activity: Yes    Birth Control/ Protection: Surgical     Comment: Hysterectomy   Other Topics Concern  . None   Social History Narrative   She is a married mother of 71, grandmother of 54, great grandmother of 1. She walks   daily about 2 miles a day, does not smoke, does not drink.    History reviewed. No pertinent family history.   Wt Readings from Last 3 Encounters:  12/23/14 142 lb 4.8 oz (64.547 kg)  12/23/13 141 lb (63.957 kg)  12/18/12 144 lb 6.4 oz (65.499 kg)    PHYSICAL EXAM BP 118/64 mmHg  Pulse 75  Ht 5\' 4"  (1.626 m)  Wt 142 lb 4.8 oz  (64.547 kg)  BMI 24.41 kg/m2 General: She is a very pleasant, healthy appearing woman looking much younger than her stated age. well groomed, well nourished.A&Ox3, answers questions appropriately. NAD. normal mood and affect.  HEENT: NCAT, EOMI, MMM. Anicteric sclerae.  Neck: Supple, no LAN, JVD  -- cannot exclude soft R carotid bruit.  Heart: RRR, normal S1, S2, no rubs or gallops, but she does have a soft SEM @ RUSB; no R/G; Nondisplaced PMI. Lungs: CTAB, nonlabored, normal effort, good air movement.  Abdomen: Soft/NT/ND/NABS.  Extremities: No C/C/E.     Adult ECG Report  Rate: 75 ;  Rhythm: normal sinus rhythm; normal axis, intervals & durations. Normal voltage.  Narrative Interpretation: Normal EKG    Other studies Reviewed: Additional studies/ records that were reviewed today include:  Recent Labs:   Lab Results  Component Value Date   CHOL 185 12/26/2013   HDL 50 12/26/2013   LDLCALC 121* 12/26/2013   TRIG 71 12/26/2013   CHOLHDL 3.7 12/26/2013    ASSESSMENT / PLAN: Problem List Items Addressed This Visit    Right carotid bruit - Primary    Not heard previously - will order Carotids Dopplers      Relevant Orders   EKG 12-Lead (Completed)   Lipid panel   Hepatic function panel   VAS US CAROTID   H/O hypercholesterolemia (Chronic)    Essentially not on meds - ;last labs were 1 yr ago & LDL 121 (pretty much goal for her). Monitor - due for labs again this year.      Relevant Orders   EKG 12-Lead (Completed)   Lipid panel   Hepatic function panel   VAS US CAROTID   Essential hypertension; borderline (Chronic)    Well controlled without medications      Relevant Orders   EKG 12-Lead (Completed)   Lipid panel   Hepatic function panel   VAS US CAROTID   Edema of left lower extremity (Chronic)    Support stockings - stable.  Elevate feet when possible      Relevant Orders   Lipid panel   Hepatic function panel   VAS US CAROTID    Other Visit  Diagnoses    Drug therapy        Relevant Orders    Lipid panel    Hepatic function panel    VAS US CAROTID       Current medicines are reviewed at length with the patient today. (+/- concerns) none The following changes have been made: none  Studies Ordered:   Orders Placed This Encounter  Procedures  . Lipid panel  . Hepatic function panel  . EKG 12-Lead      Leonie Man, M.D., M.S. Interventional Cardiologist  Pager # [phone]

## 2014-12-23 NOTE — Patient Instructions (Signed)
LABS - LIVER PANEL, LIPIDS NOTHING TO EAT OR DRINK THE MORNING OF TEST   Your physician has requested that you have a carotid duplex. This test is an ultrasound of the carotid arteries in your neck. It looks at blood flow through these arteries that supply the brain with blood. Allow one hour for this exam. There are no restrictions or special instructions.   Your physician wants you to follow-up in [place].  You will receive a reminder letter in the mail two months in advance. If you don't receive a letter, please call our office to schedule the follow-up appointment.\  If you need a refill on your cardiac medications before your next appointment, please call your pharmacy.

## 2014-12-25 ENCOUNTER — Encounter: Payer: Self-pay | Admitting: Cardiology

## 2014-12-25 LAB — LIPID PANEL
Cholesterol: 186 mg/dL (ref 125–200)
HDL: 64 mg/dL (ref >=46)
LDL Cholesterol: 109 mg/dL (ref <130)
Total CHOL/HDL Ratio: 2.9 Ratio (ref <=5.0)
Triglycerides: 66 mg/dL (ref <150)
VLDL: 13 mg/dL (ref <30)

## 2014-12-25 LAB — HEPATIC FUNCTION PANEL
ALT: 14 U/L (ref 6–29)
AST: 19 U/L (ref 10–35)
Albumin: 3.8 g/dL (ref 3.6–5.1)
Alkaline Phosphatase: 51 U/L (ref 33–130)
Bilirubin, Direct: 0.1 mg/dL (ref <=0.2)
Indirect Bilirubin: 0.6 mg/dL (ref 0.2–1.2)
Total Bilirubin: 0.7 mg/dL (ref 0.2–1.2)
Total Protein: 6.7 g/dL (ref 6.1–8.1)

## 2014-12-25 NOTE — Assessment & Plan Note (Signed)
Essentially not on meds - ;last labs were 1 yr ago & LDL 121 (pretty much goal for her). Monitor - due for labs again this year.

## 2014-12-25 NOTE — Assessment & Plan Note (Signed)
Support stockings - stable.  Elevate feet when possible

## 2014-12-25 NOTE — Assessment & Plan Note (Signed)
Not heard previously - will order Carotids Dopplers

## 2014-12-25 NOTE — Assessment & Plan Note (Signed)
Well controlled without medications.  

## 2014-12-30 ENCOUNTER — Ambulatory Visit (HOSPITAL_COMMUNITY)
Admission: RE | Admit: 2014-12-30 | Discharge: 2014-12-30 | Disposition: A | Discharge status: Home / Self Care | Payer: Medicare Other | Source: Ambulatory Visit | Attending: Cardiology | Admitting: Cardiology

## 2014-12-30 DIAGNOSIS — I6523 Occlusion and stenosis of bilateral carotid arteries: Secondary | ICD-10-CM | POA: Diagnosis not present

## 2014-12-30 DIAGNOSIS — Z8639 Personal history of other endocrine, nutritional and metabolic disease: Secondary | ICD-10-CM | POA: Diagnosis not present

## 2014-12-30 DIAGNOSIS — Z79899 Other long term (current) drug therapy: Secondary | ICD-10-CM | POA: Diagnosis not present

## 2014-12-30 DIAGNOSIS — R0989 Other specified symptoms and signs involving the circulatory and respiratory systems: Secondary | ICD-10-CM | POA: Diagnosis not present

## 2014-12-30 DIAGNOSIS — R600 Localized edema: Secondary | ICD-10-CM

## 2014-12-30 DIAGNOSIS — I10 Essential (primary) hypertension: Secondary | ICD-10-CM | POA: Diagnosis not present

## 2015-01-09 ENCOUNTER — Telehealth: Payer: Self-pay | Admitting: *Deleted

## 2015-01-09 NOTE — Telephone Encounter (Signed)
-----   Message from Leonie Man, MD sent at 01/08/2015  9:31 PM EST ----- Overall - cholesterol levels look stable & LFTs are normal.  No change in Rx.  Leonie Man, MD

## 2015-01-09 NOTE — Telephone Encounter (Signed)
Spoke to patient.  Labs and carotid Result given . Verbalized understanding

## 2015-01-09 NOTE — Telephone Encounter (Signed)
-----   Message from Leonie Man, MD sent at 01/08/2015  9:32 PM EST ----- Normal Carotid US - probably what I heard is due to turbulent flow in a tortuous vessel.  Nothing to be concerned about.  Leonie Man, MD

## 2015-03-25 ENCOUNTER — Encounter (INDEPENDENT_AMBULATORY_CARE_PROVIDER_SITE_OTHER): Payer: Medicare Other | Admitting: Ophthalmology

## 2015-03-25 ENCOUNTER — Ambulatory Visit (INDEPENDENT_AMBULATORY_CARE_PROVIDER_SITE_OTHER): Payer: Medicare Other

## 2015-03-25 ENCOUNTER — Ambulatory Visit (INDEPENDENT_AMBULATORY_CARE_PROVIDER_SITE_OTHER): Payer: Medicare Other | Admitting: Emergency Medicine

## 2015-03-25 VITALS — BP 136/60 | HR 83 | Temp 97.8°F | Resp 16 | Ht 63.0 in | Wt 142.0 lb

## 2015-03-25 DIAGNOSIS — R059 Cough, unspecified: Secondary | ICD-10-CM

## 2015-03-25 DIAGNOSIS — J029 Acute pharyngitis, unspecified: Secondary | ICD-10-CM

## 2015-03-25 DIAGNOSIS — R05 Cough: Secondary | ICD-10-CM

## 2015-03-25 DIAGNOSIS — J209 Acute bronchitis, unspecified: Secondary | ICD-10-CM | POA: Diagnosis not present

## 2015-03-25 LAB — POCT CBC
Granulocyte percent: 38.5 %G (ref 37–80)
HCT, POC: 35.8 % — AB (ref 37.7–47.9)
Hemoglobin: 12.5 g/dL (ref 12.2–16.2)
Lymph, poc: 3.8 — AB (ref 0.6–3.4)
MCH, POC: 30.5 pg (ref 27–31.2)
MCHC: 35 g/dL (ref 31.8–35.4)
MCV: 87.1 fL (ref 80–97)
MID (cbc): 0.7 (ref 0–0.9)
MPV: 7 fL (ref 0–99.8)
POC Granulocyte: 2.8 (ref 2–6.9)
POC LYMPH PERCENT: 51.9 %L — AB (ref 10–50)
POC MID %: 9.7 %M (ref 0–12)
Platelet Count, POC: 182 10*3/uL (ref 142–424)
RBC: 4.11 M/uL (ref 4.04–5.48)
RDW, POC: 13.5 %
WBC: 7.4 10*3/uL (ref 4.6–10.2)

## 2015-03-25 LAB — POCT RAPID STREP A (OFFICE): Rapid Strep A Screen: NEGATIVE

## 2015-03-25 MED ORDER — AMOXICILLIN 500 MG PO CAPS: 500.0000 mg | ORAL_CAPSULE | Freq: Two times a day (BID) | ORAL | Status: DC

## 2015-03-25 MED ORDER — BENZONATATE 100 MG PO CAPS: 100.0000 mg | ORAL_CAPSULE | Freq: Three times a day (TID) | ORAL | Status: DC | PRN

## 2015-03-25 NOTE — Progress Notes (Signed)
By signing my name below, I, Moises Blood, attest that this documentation has been prepared under the direction and in the presence of Arlyss Queen, MD. Electronically Signed: Moises Blood, [place]. 03/25/2015 , 1:47 PM .  Patient was seen in room 14 .  Chief Complaint:  Chief Complaint  Patient presents with  . Cough  . Sore Throat  . Headache    HPI: [name] is a 82 y.o. female who [facility] to [facility] today complaining of cough with sore throat and headache that's started about 2-3 weeks ago. She states that her sore throat bothers her the most. She's been trying to use warm vinegar water to gargle without relief. Last night, she's tried alka seltzer plus without much relief either. She also mentions having clear productive cough and nasal congestion with rhinorrhea. She's used nasonex nasal spray once a day. She denies taking any medication for allergies.   Past Medical History  Diagnosis Date  . Cystocele 2012  . Urge incontinence 2012  . Urinary frequency 2010  . GERD (gastroesophageal reflux disease)   . H/O hypercholesterolemia   . Hypertension   . H/O varicose veins   . Mitral valve prolapse   . H/O varicella   . Mumps   . Abnormal Pap smear 1975-76  . H/O osteopenia    Past Surgical History  Procedure Laterality Date  . Abdominal hysterectomy    . Nm myoview ltd  March 2009    Negative for ischemia or infarction  . Transthoracic echocardiogram  March 2009    Normal EF, mild aortic sclerosis with no stenosis.  . Lower extremity venous dopplers  01/09/2012    Right and left lower steroids: No evidence of thrombus or, thrombophlebitis; right and left GSV and SSV: No venous insufficiency. Normal exam.   Social History   Social History  . Marital Status: Married    Spouse Name: N/A  . Number of Children: N/A  . Years of Education: N/A   Social History Main Topics  . Smoking status: Never Smoker   . Smokeless tobacco: Never Used  . Alcohol Use: No  .  Drug Use: No  . Sexual Activity: Yes    Birth Control/ Protection: Surgical     Comment: Hysterectomy   Other Topics Concern  . None   Social History Narrative   She is a married mother of 56, grandmother of 25, great grandmother of 1. She walks   daily about 2 miles a day, does not smoke, does not drink.    History reviewed. No pertinent family history. Allergies  Allergen Reactions  . Ivp Dye [Iodinated Diagnostic Agents] Hives   Prior to Admission medications   Medication Sig Start Date End Date Taking? Authorizing Provider  CALCIUM CARBONATE PO Take 1 tablet by mouth as needed.    Yes Historical Provider, MD  cholecalciferol (VITAMIN D) 400 UNITS TABS tablet Take 400 Units by mouth daily.   Yes Historical Provider, MD  Multiple Vitamin (MULTIVITAMIN WITH MINERALS) TABS Take 1 tablet by mouth daily.   Yes Historical Provider, MD     ROS:  Constitutional: negative for fever, chills, night sweats, weight changes; positive for fatigue HEENT: negative for vision changes, hearing loss, epistaxis, or sinus pressure; positive for sore throat, congestion, rhinorrhea Cardiovascular: negative for chest pain or palpitations Respiratory: negative for hemoptysis, wheezing, shortness of breath; positive for cough Abdominal: negative for abdominal pain, nausea, vomiting, diarrhea, or constipation Dermatological: negative for rash Neurologic: negative for dizziness, or  syncope; positive for headache All other systems reviewed and are otherwise negative with the exception to those above and in the HPI.  PHYSICAL EXAM: Filed Vitals:   03/25/15 1259  BP: 136/60  Pulse: 83  Temp: 97.8 F (36.6 C)  Resp: 16   Body mass index is 25.16 kg/(m^2).   General: no acute distress; Alert and cooperative HEENT:  Normocephalic, atraumatic, oropharynx patent; nasal congestion, ears normal, throat slightly red Eye: EOMI, PEERLDC Cardiovascular:  Regular rate and rhythm, no rubs murmurs or gallops.   No Carotid bruits, radial pulse intact. No pedal edema.  Respiratory: Clear to auscultation bilaterally.  No wheezes, rales, or rhonchi.  No cyanosis, no use of accessory musculature Abdominal: No organomegaly, abdomen is soft and non-tender, positive bowel sounds. No masses. Musculoskeletal: Gait intact. No edema, tenderness Skin: No rashes; orange size lipoma on her back Neurologic: Facial musculature symmetric. Psychiatric: Patient acts appropriately throughout our interaction.  Lymphatic: No cervical or submandibular lymphadenopathy Genitourinary/Anorectal: No acute findings  LABS: Results for orders placed or performed in visit on 03/25/15  POCT CBC  Result Value Ref Range   WBC 7.4 4.6 - 10.2 K/uL   Lymph, poc 3.8 (A) 0.6 - 3.4   POC LYMPH PERCENT 51.9 (A) 10 - 50 %L   MID (cbc) 0.7 0 - 0.9   POC MID % 9.7 0 - 12 %M   POC Granulocyte 2.8 2 - 6.9   Granulocyte percent 38.5 37 - 80 %G   RBC 4.11 4.04 - 5.48 M/uL   Hemoglobin 12.5 12.2 - 16.2 g/dL   HCT, POC 35.8 (A) 37.7 - 47.9 %   MCV 87.1 80 - 97 fL   MCH, POC 30.5 27 - 31.2 pg   MCHC 35.0 31.8 - 35.4 g/dL   RDW, POC 13.5 %   Platelet Count, POC 182 142 - 424 K/uL   MPV 7.0 0 - 99.8 fL  POCT rapid strep A  Result Value Ref Range   Rapid Strep A Screen Negative Negative    EKG/XRAY:   Dg Chest 2 View  03/25/2015  CLINICAL DATA:  Shortness of breath, cough, lower extremity edema, nonsmoker. EXAM: CHEST  2 VIEW COMPARISON:  None in PACs FINDINGS: The lungs are mildly hyperinflated with hemidiaphragm flattening. There is no focal infiltrate. There is no pleural effusion. The heart is normal in size. The pulmonary vascularity is not engorged. The mediastinum is normal in width. IMPRESSION: Hyperinflation consistent with reactive airway disease or acute bronchitis. There is no evidence of pneumonia nor CHF. Electronically Signed   By: David  Martinique M.D.   On: 03/25/2015 14:11    ASSESSMENT/PLAN:   Chest x-ray consistent with  bronchitis. Will treat with amoxicillin for 7 days along with Tessalon perles I personally performed the services described in this documentation, which was scribed in my presence. The recorded information has been reviewed and is accurate.   Gross sideeffects, risk and benefits, and alternatives of medications d/w patient. Patient is aware that all medications have potential sideeffects and we are unable to predict every sideeffect or drug-drug interaction that may occur.  Arlyss Queen MD 03/25/2015 1:47 PM

## 2015-03-25 NOTE — Patient Instructions (Addendum)
Because you received an x-ray today, you will receive an invoice from [facility]. Please contact [facility] at [phone] with questions or concerns regarding your invoice. Our billing staff will not be able to assist you with those questions. Acute Bronchitis Bronchitis is inflammation of the airways that extend from the windpipe into the lungs (bronchi). The inflammation often causes mucus to develop. This leads to a cough, which is the most common symptom of bronchitis.  In acute bronchitis, the condition usually develops suddenly and goes away over time, usually in a couple weeks. Smoking, allergies, and asthma can make bronchitis worse. Repeated episodes of bronchitis may cause further lung problems.  CAUSES Acute bronchitis is most often caused by the same virus that causes a cold. The virus can spread from person to person (contagious) through coughing, sneezing, and touching contaminated objects. SIGNS AND SYMPTOMS   Cough.   Fever.   Coughing up mucus.   Body aches.   Chest congestion.   Chills.   Shortness of breath.   Sore throat.  DIAGNOSIS  Acute bronchitis is usually diagnosed through a physical exam. Your health care provider will also ask you questions about your medical history. Tests, such as chest X-rays, are sometimes done to rule out other conditions.  TREATMENT  Acute bronchitis usually goes away in a couple weeks. Oftentimes, no medical treatment is necessary. Medicines are sometimes given for relief of fever or cough. Antibiotic medicines are usually not needed but may be prescribed in certain situations. In some cases, an inhaler may be recommended to help reduce shortness of breath and control the cough. A cool mist vaporizer may also be used to help thin bronchial secretions and make it easier to clear the chest.  HOME CARE INSTRUCTIONS  Get plenty of rest.   Drink enough fluids to keep your urine clear or pale yellow (unless  you have a medical condition that requires fluid restriction). Increasing fluids may help thin your respiratory secretions (sputum) and reduce chest congestion, and it will prevent dehydration.   Take medicines only as directed by your health care provider.  If you were prescribed an antibiotic medicine, finish it all even if you start to feel better.  Avoid smoking and secondhand smoke. Exposure to cigarette smoke or irritating chemicals will make bronchitis worse. If you are a smoker, consider using nicotine gum or skin patches to help control withdrawal symptoms. Quitting smoking will help your lungs heal faster.   Reduce the chances of another bout of acute bronchitis by washing your hands frequently, avoiding people with cold symptoms, and trying not to touch your hands to your mouth, nose, or eyes.   Keep all follow-up visits as directed by your health care provider.  SEEK MEDICAL CARE IF: Your symptoms do not improve after 1 week of treatment.  SEEK IMMEDIATE MEDICAL CARE IF:  You develop an increased fever or chills.   You have chest pain.   You have severe shortness of breath.  You have bloody sputum.   You develop dehydration.  You faint or repeatedly feel like you are going to pass out.  You develop repeated vomiting.  You develop a severe headache. MAKE SURE YOU:   Understand these instructions.  Will watch your condition.  Will get help right away if you are not doing well or get worse.   This information is not intended to replace advice given to you by your health care provider. Make sure you discuss any questions you have with your   health care provider.   Document Released: 02/11/2004 Document Revised: 01/24/2014 Document Reviewed: 06/26/2012 Elsevier Interactive Patient Education 2016 [employer]  

## 2015-04-02 ENCOUNTER — Encounter (INDEPENDENT_AMBULATORY_CARE_PROVIDER_SITE_OTHER): Payer: Medicare Other | Admitting: Ophthalmology

## 2015-04-02 DIAGNOSIS — H353211 Exudative age-related macular degeneration, right eye, with active choroidal neovascularization: Secondary | ICD-10-CM | POA: Diagnosis not present

## 2015-04-02 DIAGNOSIS — H43813 Vitreous degeneration, bilateral: Secondary | ICD-10-CM

## 2015-04-02 DIAGNOSIS — H353124 Nonexudative age-related macular degeneration, left eye, advanced atrophic with subfoveal involvement: Secondary | ICD-10-CM | POA: Diagnosis not present

## 2015-04-27 ENCOUNTER — Encounter (HOSPITAL_COMMUNITY): Payer: Self-pay | Admitting: Emergency Medicine

## 2015-04-27 ENCOUNTER — Emergency Department (HOSPITAL_COMMUNITY): Payer: Medicare Other

## 2015-04-27 ENCOUNTER — Emergency Department (HOSPITAL_COMMUNITY)
Admission: EM | Admit: 2015-04-27 | Discharge: 2015-04-28 | Disposition: A | Discharge status: Home / Self Care | Payer: Medicare Other | Attending: Emergency Medicine | Admitting: Emergency Medicine

## 2015-04-27 DIAGNOSIS — Z8639 Personal history of other endocrine, nutritional and metabolic disease: Secondary | ICD-10-CM | POA: Insufficient documentation

## 2015-04-27 DIAGNOSIS — R1030 Lower abdominal pain, unspecified: Secondary | ICD-10-CM

## 2015-04-27 DIAGNOSIS — Z792 Long term (current) use of antibiotics: Secondary | ICD-10-CM | POA: Insufficient documentation

## 2015-04-27 DIAGNOSIS — Z8619 Personal history of other infectious and parasitic diseases: Secondary | ICD-10-CM | POA: Insufficient documentation

## 2015-04-27 DIAGNOSIS — Z79899 Other long term (current) drug therapy: Secondary | ICD-10-CM | POA: Insufficient documentation

## 2015-04-27 DIAGNOSIS — M8580 Other specified disorders of bone density and structure, unspecified site: Secondary | ICD-10-CM | POA: Diagnosis not present

## 2015-04-27 DIAGNOSIS — Z8742 Personal history of other diseases of the female genital tract: Secondary | ICD-10-CM | POA: Insufficient documentation

## 2015-04-27 DIAGNOSIS — Z90710 Acquired absence of both cervix and uterus: Secondary | ICD-10-CM | POA: Insufficient documentation

## 2015-04-27 DIAGNOSIS — I10 Essential (primary) hypertension: Secondary | ICD-10-CM | POA: Diagnosis not present

## 2015-04-27 DIAGNOSIS — K644 Residual hemorrhoidal skin tags: Secondary | ICD-10-CM | POA: Insufficient documentation

## 2015-04-27 DIAGNOSIS — K5733 Diverticulitis of large intestine without perforation or abscess with bleeding: Secondary | ICD-10-CM | POA: Diagnosis not present

## 2015-04-27 LAB — CBC
HCT: 36.5 % (ref 36.0–46.0)
Hemoglobin: 12.9 g/dL (ref 12.0–15.0)
MCH: 29.7 pg (ref 26.0–34.0)
MCHC: 35.3 g/dL (ref 30.0–36.0)
MCV: 83.9 fL (ref 78.0–100.0)
Platelets: 190 10*3/uL (ref 150–400)
RBC: 4.35 MIL/uL (ref 3.87–5.11)
RDW: 13.5 % (ref 11.5–15.5)
WBC: 11.9 10*3/uL — ABNORMAL HIGH (ref 4.0–10.5)

## 2015-04-27 LAB — TYPE AND SCREEN
ABO/RH(D): O POS
Antibody Screen: NEGATIVE

## 2015-04-27 LAB — URINALYSIS, ROUTINE W REFLEX MICROSCOPIC
Bilirubin Urine: NEGATIVE
Glucose, UA: NEGATIVE mg/dL
Hgb urine dipstick: NEGATIVE
Ketones, ur: NEGATIVE mg/dL
Leukocytes, UA: NEGATIVE
Nitrite: NEGATIVE
Protein, ur: NEGATIVE mg/dL
Specific Gravity, Urine: 1.014 (ref 1.005–1.030)
pH: 7 (ref 5.0–8.0)

## 2015-04-27 LAB — COMPREHENSIVE METABOLIC PANEL
ALT: 16 U/L (ref 14–54)
AST: 21 U/L (ref 15–41)
Albumin: 4.5 g/dL (ref 3.5–5.0)
Alkaline Phosphatase: 52 U/L (ref 38–126)
Anion gap: 11 (ref 5–15)
BUN: 19 mg/dL (ref 6–20)
CO2: 24 mmol/L (ref 22–32)
Calcium: 9.8 mg/dL (ref 8.9–10.3)
Chloride: 106 mmol/L (ref 101–111)
Creatinine, Ser: 0.88 mg/dL (ref 0.44–1.00)
GFR calc Af Amer: >60 mL/min (ref >60)
GFR calc non Af Amer: 58 mL/min — ABNORMAL LOW (ref >60)
Glucose, Bld: 103 mg/dL — ABNORMAL HIGH (ref 65–99)
Potassium: 4.4 mmol/L (ref 3.5–5.1)
Sodium: 141 mmol/L (ref 135–145)
Total Bilirubin: 0.7 mg/dL (ref 0.3–1.2)
Total Protein: 7.7 g/dL (ref 6.5–8.1)

## 2015-04-27 LAB — POC OCCULT BLOOD, ED: Fecal Occult Bld: NEGATIVE

## 2015-04-27 LAB — LIPASE, BLOOD: Lipase: 33 U/L (ref 11–51)

## 2015-04-27 MED ORDER — HYDROCODONE-ACETAMINOPHEN 5-325 MG PO TABS: 1.0000 | ORAL_TABLET | ORAL | Status: DC | PRN

## 2015-04-27 MED ORDER — CIPROFLOXACIN HCL 500 MG PO TABS: 500.0000 mg | ORAL_TABLET | Freq: Two times a day (BID) | ORAL | Status: DC

## 2015-04-27 MED ORDER — FENTANYL CITRATE (PF) 100 MCG/2ML IJ SOLN
12.5000 ug | Freq: Once | INTRAMUSCULAR | Status: AC
  Administered 2015-04-27: 12.5 ug via INTRAVENOUS
  Filled 2015-04-27: qty 2

## 2015-04-27 MED ORDER — METRONIDAZOLE 500 MG PO TABS
500.0000 mg | ORAL_TABLET | Freq: Once | ORAL | Status: AC
  Administered 2015-04-28: 500 mg via ORAL
  Filled 2015-04-27: qty 1

## 2015-04-27 MED ORDER — SODIUM CHLORIDE 0.9 % IV BOLUS (SEPSIS)
500.0000 mL | Freq: Once | INTRAVENOUS | Status: AC
  Administered 2015-04-27: 500 mL via INTRAVENOUS

## 2015-04-27 MED ORDER — METRONIDAZOLE 500 MG PO TABS: 500.0000 mg | ORAL_TABLET | Freq: Two times a day (BID) | ORAL | Status: DC

## 2015-04-27 MED ORDER — CIPROFLOXACIN HCL 500 MG PO TABS
500.0000 mg | ORAL_TABLET | Freq: Once | ORAL | Status: AC
  Administered 2015-04-28: 500 mg via ORAL
  Filled 2015-04-27: qty 1

## 2015-04-27 MED ORDER — OXYCODONE-ACETAMINOPHEN 5-325 MG PO TABS
2.0000 | ORAL_TABLET | Freq: Once | ORAL | Status: AC
  Administered 2015-04-28: 2 via ORAL
  Filled 2015-04-27: qty 2

## 2015-04-27 NOTE — Discharge Instructions (Signed)
1. Medications: vicodin for pain, cipro, flagyl, usual home medications 2. Treatment: rest, drink plenty of fluids 3. Follow Up: please followup with your primary doctor in 2-3 days for discussion of your diagnoses and further evaluation after today's visit; if you do not have a primary care doctor use the phone number listed in your discharge paperwork to find one; please return to the ER for high fever, severe pain, vomiting, increased blood in stool, new or worsening symptoms    Diverticulitis Diverticulitis is when small pockets that have formed in your colon (large intestine) become infected or swollen. HOME CARE  Follow your doctor's instructions.  Follow a special diet if told by your doctor.  When you feel better, your doctor may tell you to change your diet. You may be told to eat a lot of fiber. Fruits and vegetables are good sources of fiber. Fiber makes it easier to poop (have bowel movements).  Take supplements or probiotics as told by your doctor.  Only take medicines as told by your doctor.  Keep all follow-up visits with your doctor. GET HELP IF:  Your pain does not get better.  You have a hard time eating food.  You are not pooping like normal. GET HELP RIGHT AWAY IF:  Your pain gets worse.  Your problems do not get better.  Your problems suddenly get worse.  You have a fever.  You keep throwing up (vomiting).  You have bloody or black, tarry poop (stool). MAKE SURE YOU:   Understand these instructions.  Will watch your condition.  Will get help right away if you are not doing well or get worse.   This information is not intended to replace advice given to you by your health care provider. Make sure you discuss any questions you have with your health care provider.   Document Released: 06/22/2007 Document Revised: 01/08/2013 Document Reviewed: 11/28/2012 Elsevier Interactive Patient Education [employer].

## 2015-04-27 NOTE — ED Notes (Signed)
Lower central abdominal pain x 3 days. Denies difficulties with urination. Pt states she noticed a small amount of mucus bloody stool this afternoon. Pt appears pale, vitals stable in triage. Denies N/V/D, fever

## 2015-04-27 NOTE — ED Notes (Signed)
1st blood culture in mini lab

## 2015-04-27 NOTE — ED Provider Notes (Signed)
CSN: [id number]     Arrival date & time 04/27/15  1715 History   First MD Initiated Contact with Patient 04/27/15 1950     Chief Complaint  Patient presents with  . Rectal Bleeding  . Abdominal Pain    HPI   [name] is a 82 y.o. female with a PMH of HLD, HTN, MVP, cystocele who presents to the ED with abdominal pain x 3-4 days. She notes her pain is in her lower abdomen and has been progressively worsening since its onset. She denies exacerbating or alleviating factors. She denies fever, chills, chest pain, shortness of breath, nausea, vomiting, dysuria, urgency, frequency. She notes diarrhea, and reports she had streaks of blood in her stool today.    Past Medical History  Diagnosis Date  . Cystocele 2012  . Urge incontinence 2012  . Urinary frequency 2010  . GERD (gastroesophageal reflux disease)   . H/O hypercholesterolemia   . Hypertension   . H/O varicose veins   . Mitral valve prolapse   . H/O varicella   . Mumps   . Abnormal Pap smear 1975-76  . H/O osteopenia    Past Surgical History  Procedure Laterality Date  . Abdominal hysterectomy    . Nm myoview ltd  March 2009    Negative for ischemia or infarction  . Transthoracic echocardiogram  March 2009    Normal EF, mild aortic sclerosis with no stenosis.  . Lower extremity venous dopplers  01/09/2012    Right and left lower steroids: No evidence of thrombus or, thrombophlebitis; right and left GSV and SSV: No venous insufficiency. Normal exam.   History reviewed. No pertinent family history. Social History  Substance Use Topics  . Smoking status: Never Smoker   . Smokeless tobacco: Never Used  . Alcohol Use: No   OB History    Gravida Para Term Preterm AB TAB SAB Ectopic Multiple Living   3 3 3       3       Review of Systems  Constitutional: Negative for fever and chills.  Respiratory: Negative for shortness of breath.   Cardiovascular: Negative for chest pain.  Gastrointestinal: Positive for  abdominal pain, diarrhea and blood in stool. Negative for nausea, vomiting and constipation.  Genitourinary: Negative for dysuria, urgency and frequency.  All other systems reviewed and are negative.     Allergies  Ivp dye  Home Medications   Prior to Admission medications   Medication Sig Start Date End Date Taking? Authorizing Provider  cholecalciferol (VITAMIN D) 400 UNITS TABS tablet Take 400 Units by mouth daily.   Yes Historical Provider, MD  Multiple Vitamin (MULTIVITAMIN WITH MINERALS) TABS Take 1 tablet by mouth daily.   Yes Historical Provider, MD  vitamin B-12 (CYANOCOBALAMIN) 1000 MCG tablet Take 1,000 mcg by mouth daily.   Yes Historical Provider, MD  amoxicillin (AMOXIL) 500 MG capsule Take 1 capsule (500 mg total) by mouth 2 (two) times daily. 03/25/15   Darlyne Russian, MD  benzonatate (TESSALON) 100 MG capsule Take 1-2 capsules (100-200 mg total) by mouth 3 (three) times daily as needed for cough. 03/25/15   Darlyne Russian, MD    BP 129/62 mmHg  Pulse 92  Temp(Src) 98.6 F (37 C) (Oral)  Resp 16  SpO2 96% Physical Exam  Constitutional: She is oriented to person, place, and time. She appears well-developed and well-nourished. No distress.  Patient appears uncomfortable due to pain.  HENT:  Head: Normocephalic and atraumatic.  Right  Ear: External ear normal.  Left Ear: External ear normal.  Nose: Nose normal.  Mouth/Throat: Uvula is midline, oropharynx is clear and moist and mucous membranes are normal.  Eyes: Conjunctivae, EOM and lids are normal. Pupils are equal, round, and reactive to light. Right eye exhibits no discharge. Left eye exhibits no discharge. No scleral icterus.  Neck: Normal range of motion. Neck supple.  Cardiovascular: Normal rate, regular rhythm, normal heart sounds, intact distal pulses and normal pulses.   Pulmonary/Chest: Effort normal and breath sounds normal. No respiratory distress. She has no wheezes. She has no rales.  Abdominal: Soft.  Normal appearance and bowel sounds are normal. She exhibits no distension and no mass. There is tenderness. There is no rigidity, no rebound and no guarding.  TTP in lower abdomen bilaterally.   Genitourinary: Rectal exam shows external hemorrhoid. Rectal exam shows no fissure and no tenderness.  External hemorrhoids present. No evidence of thrombosis. No bright red blood or melanotic stool in rectal vault.  Musculoskeletal: Normal range of motion. She exhibits no edema or tenderness.  Neurological: She is alert and oriented to person, place, and time.  Skin: Skin is warm, dry and intact. No rash noted. She is not diaphoretic. No erythema. No pallor.  Psychiatric: She has a normal mood and affect. Her speech is normal and behavior is normal.  Nursing note and vitals reviewed.   ED Course  Procedures (including critical care time)  Labs Review Labs Reviewed  COMPREHENSIVE METABOLIC PANEL - Abnormal; Notable for the following:    Glucose, Bld 103 (*)    GFR calc non Af Amer 58 (*)    All other components within normal limits  CBC - Abnormal; Notable for the following:    WBC 11.9 (*)    All other components within normal limits  LIPASE, BLOOD  URINALYSIS, ROUTINE W REFLEX MICROSCOPIC (NOT AT [facility])  POC OCCULT BLOOD, ED  TYPE AND SCREEN  ABO/RH    Imaging Review Ct Abdomen Pelvis Wo Contrast  04/27/2015  CLINICAL DATA:  Acute onset of generalized weakness and abdominal discomfort. Lower central abdominal pain. Bloody mucus in stool. Initial encounter. EXAM: CT ABDOMEN AND PELVIS WITHOUT CONTRAST TECHNIQUE: Multidetector CT imaging of the abdomen and pelvis was performed following the standard protocol without IV contrast. COMPARISON:  MRI of the lumbar spine performed 05/01/2014, and abdominal ultrasound performed 02/24/2014. CT of the abdomen and pelvis performed 09/22/2009 FINDINGS: Mild bibasilar atelectasis or scarring is noted. A tiny hiatal hernia is noted. Scattered coronary  artery calcifications are seen. A small calcified granuloma is noted at the right hepatic lobe adjacent to the gallbladder fossa. The liver and spleen are otherwise unremarkable. The gallbladder is within normal limits. The pancreas and adrenal glands are unremarkable. The kidneys are unremarkable in appearance. There is no evidence of hydronephrosis. No renal or ureteral stones are seen. No perinephric stranding is appreciated. No free fluid is identified. The small bowel is unremarkable in appearance. The stomach is within normal limits. No acute vascular abnormalities are seen. The appendix is normal in caliber, without evidence for appendicitis. Contrast progresses to the level of the hepatic flexure of the colon. Focal soft tissue inflammation is noted at the distal sigmoid colon, with an associated inflamed diverticulum and mild wall thickening, concerning for acute diverticulitis. No additional diverticulosis is noted. There is no evidence of perforation or abscess formation at this time. The bladder is moderately distended and grossly unremarkable. The patient is status post hysterectomy. No suspicious adnexal  masses are seen. No inguinal lymphadenopathy is seen. No acute osseous abnormalities are identified. Degenerative change is noted at the pubic symphysis. There is mild grade 1 anterolisthesis of L3 on L4, reflecting underlying facet disease. Vacuum phenomenon is noted at L4-L5. IMPRESSION: 1. Mild acute diverticulitis noted at the distal sigmoid colon, with focal soft tissue inflammation, mild wall thickening and single inflamed diverticulum. No evidence of perforation or abscess formation at this time. 2. No additional diverticulosis noted. 3. Mild bibasilar atelectasis or scarring noted. 4. Tiny hiatal hernia seen. 5. Scattered coronary artery calcifications noted. Electronically Signed   By: Garald Balding M.D.   On: 04/27/2015 22:21   I have personally reviewed and evaluated these images and lab  results as part of my medical decision-making.   EKG Interpretation None      MDM   Final diagnoses:  Lower abdominal pain    82 year old female presents with abdominal pain x 3-4 days. Denies fever, chills, chest pain, shortness of breath, nausea, vomiting, dysuria, urgency, frequency. Notes diarrhea, and reports she had streaks of blood in her stool today.   Patient is afebrile. Vital signs stable. Abdomen soft, non-distended, with TTP in lower quadrants bilaterally. No rebound, guarding, or masses. External hemorrhoids present on exam. No evidence of thrombosis. No bright red blood or melanotic stool in rectal vault.    CBC remarkable for leukocytosis of 11.9, no anemia. CMP unremarkable. Lipase within normal limits. UA negative for infection. Hemoccult negative. CT abdomen pelvis remarkable for mild acute diverticulitis at the distal sigmoid colon with focal soft tissue inflammation, mild wall thickening, and single inflamed diverticulum; no evidence of perforation or abscess.  Discussed findings with patient. She reports symptom improvement s/p pain medication. Patient is non-toxic and well-appearing, feel she is stable for discharge at this time. Will treat with cipro and flagyl and give first dose in the ED. Patient to follow-up with PCP and with GI. Strict return precautions discussed. Patient verbalizes her understanding and is in agreement with plan. Patient discussed with and seen by Dr. Rex Kras.  BP 129/62 mmHg  Pulse 92  Temp(Src) 98.6 F (37 C) (Oral)  Resp 16  SpO2 96%     Marella Chimes, PA-C 04/28/15 0041  Sharlett Iles, MD 04/29/15 1510

## 2015-04-27 NOTE — ED Notes (Signed)
Pt assisted to bathroom

## 2015-04-28 LAB — ABO/RH: ABO/RH(D): O POS

## 2015-05-25 ENCOUNTER — Encounter: Payer: Self-pay | Admitting: Podiatry

## 2015-05-25 ENCOUNTER — Ambulatory Visit (INDEPENDENT_AMBULATORY_CARE_PROVIDER_SITE_OTHER): Payer: Medicare Other

## 2015-05-25 ENCOUNTER — Ambulatory Visit (INDEPENDENT_AMBULATORY_CARE_PROVIDER_SITE_OTHER): Payer: Medicare Other | Admitting: Podiatry

## 2015-05-25 VITALS — BP 118/66 | HR 89 | Resp 16 | Ht 63.5 in | Wt 140.0 lb

## 2015-05-25 DIAGNOSIS — L600 Ingrowing nail: Secondary | ICD-10-CM | POA: Diagnosis not present

## 2015-05-25 DIAGNOSIS — M79674 Pain in right toe(s): Secondary | ICD-10-CM | POA: Diagnosis not present

## 2015-05-25 NOTE — Progress Notes (Signed)
Subjective:     Patient ID: [name], female   DOB: 17-Jul-1927, 82 y.o.   MRN: [id number]  HPI patient points to third toe right stating that it hurts mostly at the end of the toe but at times the whole toe hurts. She's not been able to wear shoe gear and it's been bothering her for around 6 months   Review of Systems  All other systems reviewed and are negative.      Objective:   Physical Exam  Constitutional: She is oriented to person, place, and time.  Cardiovascular: Intact distal pulses.   Musculoskeletal: Normal range of motion.  Neurological: She is oriented to person, place, and time.  Skin: Skin is warm.  Nursing note and vitals reviewed.  neurovascular status intact muscle strength adequate range of motion was within normal limits with patient found to have arthritis of the proximal interphalangeal joint digit 3 right and incurvated nail bed lateral corner right with keratotic tissue formation that when palpated is where the majority of her pain is at. Patient's found have good digital perfusion and is well oriented 3     Assessment:     Hammertoe deformity with incurvated third nail right with pain    Plan:     H&P and x-ray reviewed. Today were to focus on the nail and I infiltrated 60 mg Xylocaine Marcaine mixture removed the lateral border and proud flesh and keratotic tissue formation created a channel and applied phenol to the base with sterile dressing. Gave instructions on soaks and reappoint  X-ray report indicates there is arthritis of the interphalangeal joint right third toe with structural changes at the interphalangeal joint

## 2015-05-25 NOTE — Patient Instructions (Signed)

## 2015-05-25 NOTE — Progress Notes (Signed)
   Subjective:    Patient ID: [name], female    DOB: Jun 07, 1927, 82 y.o.   MRN: [id number]  HPI Chief Complaint  Patient presents with  . Toe Pain    Right foot, 3rd toe-lateral side; swelling; pt stated, "Whole toe hurts and can not wear shoes"; x6 months      Review of Systems  All other systems reviewed and are negative.      Objective:   Physical Exam        Assessment & Plan:

## 2015-08-06 ENCOUNTER — Encounter (INDEPENDENT_AMBULATORY_CARE_PROVIDER_SITE_OTHER): Payer: Medicare Other | Admitting: Ophthalmology

## 2015-08-06 DIAGNOSIS — H43813 Vitreous degeneration, bilateral: Secondary | ICD-10-CM | POA: Diagnosis not present

## 2015-08-06 DIAGNOSIS — H353211 Exudative age-related macular degeneration, right eye, with active choroidal neovascularization: Secondary | ICD-10-CM

## 2015-08-06 DIAGNOSIS — H353122 Nonexudative age-related macular degeneration, left eye, intermediate dry stage: Secondary | ICD-10-CM | POA: Diagnosis not present

## 2015-12-31 ENCOUNTER — Encounter (INDEPENDENT_AMBULATORY_CARE_PROVIDER_SITE_OTHER): Payer: Medicare Other | Admitting: Ophthalmology

## 2015-12-31 DIAGNOSIS — H43813 Vitreous degeneration, bilateral: Secondary | ICD-10-CM

## 2015-12-31 DIAGNOSIS — H353122 Nonexudative age-related macular degeneration, left eye, intermediate dry stage: Secondary | ICD-10-CM

## 2015-12-31 DIAGNOSIS — H353211 Exudative age-related macular degeneration, right eye, with active choroidal neovascularization: Secondary | ICD-10-CM | POA: Diagnosis not present

## 2016-03-02 ENCOUNTER — Encounter: Payer: Self-pay | Admitting: Cardiology

## 2016-03-02 ENCOUNTER — Ambulatory Visit (INDEPENDENT_AMBULATORY_CARE_PROVIDER_SITE_OTHER): Payer: Medicare Other | Admitting: Cardiology

## 2016-03-02 VITALS — BP 132/78 | HR 76 | Ht 63.0 in | Wt 136.0 lb

## 2016-03-02 DIAGNOSIS — Z8639 Personal history of other endocrine, nutritional and metabolic disease: Secondary | ICD-10-CM | POA: Diagnosis not present

## 2016-03-02 DIAGNOSIS — I10 Essential (primary) hypertension: Secondary | ICD-10-CM | POA: Diagnosis not present

## 2016-03-02 DIAGNOSIS — R0989 Other specified symptoms and signs involving the circulatory and respiratory systems: Secondary | ICD-10-CM

## 2016-03-02 NOTE — Patient Instructions (Signed)
No change with current treatment or medications  You may purchase Magnesium oxide 200 mg over the counter. Take one tablet daily   Your physician wants you to follow-up in 12 months with Dr Ellyn Hack. You will receive a reminder letter in the mail two months in advance. If you don't receive a letter, please call our office to schedule the follow-up appointment.   If you need a refill on your cardiac medications before your next appointment, please call your pharmacy.

## 2016-03-02 NOTE — Progress Notes (Signed)
PCP: Catherine Shelling, MD  Clinic Note: Chief Complaint  Patient presents with  . Follow-up    Annual follow-up for cardiac risk factors    HPI: [name] is a 83 y.o. female with a PMH below who presents today for delayed annual follow-up.  She is being followed for reported mitral prolapse and hypertension. She is a former patient of Dr. Myrtice Lauth. Otherwise negative coronary history.  [name] was last seen in December 2016, and was doing relatively well.  Recent Hospitalizations: None  Studies Reviewed:   Carotid Dopplers December 2016: Normal Dopplers. Interest vessels  Interval History: [name] presents today doing fairly well. She really doesn't have that much in the way of any significant cardiac symptoms. She says she occasionally has some palpitations that flip-flop but nothing sustained. She made a little tired if she climbs stairs rapidly, but really denies any dyspnea associated with her chest tightness/pressure. She continues to be active doing her daily activities living without any issues. Does not symptomatically do exercise routinely.   No chest pain or shortness of breath with rest or exertion. No PND, orthopnea or edema. No  lightheadedness, dizziness, weakness or syncope/near syncope. No TIA/amaurosis fugax symptoms. No claudication.  ROS: A comprehensive was performed. Review of Systems  Constitutional: Positive for weight loss (Subtle, she isn't eating much). Negative for malaise/fatigue.  HENT: Negative for congestion.   Respiratory: Negative for cough, shortness of breath and wheezing.   Gastrointestinal: Negative for abdominal pain and constipation.  Genitourinary: Negative for dysuria, frequency and urgency.  Musculoskeletal:       Nighttime cramping  Neurological: Positive for dizziness (With bending over).  Psychiatric/Behavioral: Negative for depression and memory loss. The patient is not nervous/anxious and does not have  insomnia.   All other systems reviewed and are negative.   Past Medical History:  Diagnosis Date  . Abnormal Pap smear 1975-76  . Cystocele 2012  . GERD (gastroesophageal reflux disease)   . H/O hypercholesterolemia   . H/O osteopenia   . H/O varicella   . H/O varicose veins   . Hypertension   . Mitral valve prolapse   . Mumps   . Urge incontinence 2012  . Urinary frequency 2010    Past Surgical History:  Procedure Laterality Date  . ABDOMINAL HYSTERECTOMY    . Lower Extremity Venous Dopplers  01/09/2012   Right and left lower steroids: No evidence of thrombus or, thrombophlebitis; right and left GSV and SSV: No venous insufficiency. Normal exam.  . NM MYOVIEW LTD  March 2009   Negative for ischemia or infarction  . TRANSTHORACIC ECHOCARDIOGRAM  March 2009   Normal EF, mild aortic sclerosis with no stenosis.    Current Meds  Medication Sig  . cholecalciferol (VITAMIN D) 400 UNITS TABS tablet Take 400 Units by mouth daily.  . Multiple Vitamin (MULTIVITAMIN WITH MINERALS) TABS Take 1 tablet by mouth daily.  . vitamin B-12 (CYANOCOBALAMIN) 1000 MCG tablet Take 1,000 mcg by mouth daily.    Allergies  Allergen Reactions  . Ivp Dye [Iodinated Diagnostic Agents] Hives    Social History   Social History  . Marital status: Married    Spouse name: N/A  . Number of children: N/A  . Years of education: N/A   Social History Main Topics  . Smoking status: Never Smoker  . Smokeless tobacco: Never Used  . Alcohol use No  . Drug use: No  . Sexual activity: Yes    Birth control/  protection: Surgical     Comment: Hysterectomy   Other Topics Concern  . None   Social History Narrative   She is a married mother of 47, grandmother of 17, great grandmother of 1. She walks   daily about 2 miles a day, does not smoke, does not drink.     family history is not on file.  Wt Readings from Last 3 Encounters:  03/02/16 61.7 kg (136 lb)  05/25/15 63.5 kg (140 lb)  03/25/15 64.4  kg (142 lb)    PHYSICAL EXAM BP 132/78   Pulse 76   Ht 5\' 3"  (1.6 m)   Wt 61.7 kg (136 lb)   BMI 24.09 kg/m  General: She is a very pleasant, healthy appearing woman looking much younger than her stated age. well groomed, well nourished.A&Ox3, answers questions appropriately. NAD. normal mood and affect.  HEENT: NCAT, EOMI, MMM. Anicteric sclerae.  Neck: Supple, no LAN, JVD  -- cannot exclude soft R carotid bruit.  Heart: RRR, normal S1, S2, no rubs or gallops, but she does have a soft SEM @ RUSB; no R/G; Nondisplaced PMI.  Lungs: CTAB, nonlabored, normal effort, good air movement.  Abdomen: Soft/NT/ND/NABS.  Extremities: No C/C/E.    Adult ECG Report  Rate: 76 ;  Rhythm: normal sinus rhythm and Normal axis, intervals and durations.;   Narrative Interpretation: Stable/normal EKG   Other studies Reviewed: Additional studies/ records that were reviewed today include:  Recent Labs:   Lab Results  Component Value Date   CHOL 186 12/23/2014   HDL 64 12/23/2014   LDLCALC 109 12/23/2014   TRIG 66 12/23/2014   CHOLHDL 2.9 12/23/2014     ASSESSMENT / PLAN: Problem List Items Addressed This Visit    Essential hypertension; borderline - Primary (Chronic)    Well-controlled without medications. No change      Relevant Orders   EKG 12-Lead (Completed)   H/O hypercholesterolemia (Chronic)    No longer on meds. She should be due for labs to be checked soon.      Right carotid bruit    Normal carotid Dopplers. Bruit is likely related to tortuous vessels.        Her nighttime cramping we talked quite using magnesium oxide over-the-counter as well as mustard or pickle juice  Current medicines are reviewed at length with the patient today. (+/- concerns) none The following changes have been made: None  Patient Instructions  No change with current treatment or medications  You may purchase Magnesium oxide 200 mg over the counter. Take one tablet daily   Your  physician wants you to follow-up in 12 months with Dr Ellyn Hack. You will receive a reminder letter in the mail two months in advance. If you don't receive a letter, please call our office to schedule the follow-up appointment.   If you need a refill on your cardiac medications before your next appointment, please call your pharmacy.     Studies Ordered:   Orders Placed This Encounter  Procedures  . EKG 12-Lead      Glenetta Hew, M.D., M.S. Interventional Cardiologist   Pager # [phone] Phone # [phone] [address] [place],  [address]

## 2016-03-04 ENCOUNTER — Encounter: Payer: Self-pay | Admitting: Cardiology

## 2016-03-04 NOTE — Assessment & Plan Note (Addendum)
No longer on meds. She should be due for labs to be checked soon.

## 2016-03-04 NOTE — Assessment & Plan Note (Addendum)
Normal carotid Dopplers. Bruit is likely related to tortuous vessels.

## 2016-03-04 NOTE — Assessment & Plan Note (Signed)
Well-controlled without medications. No change

## 2016-05-19 ENCOUNTER — Encounter (INDEPENDENT_AMBULATORY_CARE_PROVIDER_SITE_OTHER): Payer: Medicare Other | Admitting: Ophthalmology

## 2016-05-19 DIAGNOSIS — H353211 Exudative age-related macular degeneration, right eye, with active choroidal neovascularization: Secondary | ICD-10-CM | POA: Diagnosis not present

## 2016-05-19 DIAGNOSIS — H353122 Nonexudative age-related macular degeneration, left eye, intermediate dry stage: Secondary | ICD-10-CM | POA: Diagnosis not present

## 2016-05-19 DIAGNOSIS — H43813 Vitreous degeneration, bilateral: Secondary | ICD-10-CM | POA: Diagnosis not present

## 2016-05-31 ENCOUNTER — Other Ambulatory Visit: Payer: Self-pay | Admitting: Obstetrics and Gynecology

## 2016-06-02 ENCOUNTER — Encounter (INDEPENDENT_AMBULATORY_CARE_PROVIDER_SITE_OTHER): Payer: Medicare Other | Admitting: Ophthalmology

## 2016-06-06 ENCOUNTER — Encounter (HOSPITAL_COMMUNITY): Payer: Self-pay

## 2016-06-06 ENCOUNTER — Encounter (HOSPITAL_COMMUNITY)
Admission: RE | Admit: 2016-06-06 | Discharge: 2016-06-06 | Disposition: A | Discharge status: Home / Self Care | Payer: Medicare Other | Source: Ambulatory Visit | Attending: Obstetrics and Gynecology | Admitting: Obstetrics and Gynecology

## 2016-06-06 DIAGNOSIS — Z01812 Encounter for preprocedural laboratory examination: Secondary | ICD-10-CM | POA: Diagnosis present

## 2016-06-06 DIAGNOSIS — N8110 Cystocele, unspecified: Secondary | ICD-10-CM | POA: Insufficient documentation

## 2016-06-06 HISTORY — DX: Diverticulosis of intestine, part unspecified, without perforation or abscess without bleeding: K57.90

## 2016-06-06 HISTORY — DX: Diverticulitis of intestine, part unspecified, without perforation or abscess without bleeding: K57.92

## 2016-06-06 HISTORY — DX: Other seasonal allergic rhinitis: J30.2

## 2016-06-06 HISTORY — DX: Unspecified osteoarthritis, unspecified site: M19.90

## 2016-06-06 HISTORY — DX: Unspecified macular degeneration: H35.30

## 2016-06-06 HISTORY — DX: Other specified soft tissue disorders: M79.89

## 2016-06-06 HISTORY — DX: Anemia, unspecified: D64.9

## 2016-06-06 LAB — CBC
HCT: 35.3 % — ABNORMAL LOW (ref 36.0–46.0)
Hemoglobin: 12.4 g/dL (ref 12.0–15.0)
MCH: 29.7 pg (ref 26.0–34.0)
MCHC: 35.1 g/dL (ref 30.0–36.0)
MCV: 84.4 fL (ref 78.0–100.0)
Platelets: 202 10*3/uL (ref 150–400)
RBC: 4.18 MIL/uL (ref 3.87–5.11)
RDW: 13.7 % (ref 11.5–15.5)
WBC: 6.8 10*3/uL (ref 4.0–10.5)

## 2016-06-06 LAB — COMPREHENSIVE METABOLIC PANEL
ALT: 14 U/L (ref 14–54)
AST: 20 U/L (ref 15–41)
Albumin: 4 g/dL (ref 3.5–5.0)
Alkaline Phosphatase: 52 U/L (ref 38–126)
Anion gap: 5 (ref 5–15)
BUN: 20 mg/dL (ref 6–20)
CO2: 28 mmol/L (ref 22–32)
Calcium: 9.4 mg/dL (ref 8.9–10.3)
Chloride: 105 mmol/L (ref 101–111)
Creatinine, Ser: 0.97 mg/dL (ref 0.44–1.00)
GFR calc Af Amer: 60 mL/min — ABNORMAL LOW (ref >60)
GFR calc non Af Amer: 51 mL/min — ABNORMAL LOW (ref >60)
Glucose, Bld: 100 mg/dL — ABNORMAL HIGH (ref 65–99)
Potassium: 4.4 mmol/L (ref 3.5–5.1)
Sodium: 138 mmol/L (ref 135–145)
Total Bilirubin: 0.7 mg/dL (ref 0.3–1.2)
Total Protein: 7.4 g/dL (ref 6.5–8.1)

## 2016-06-06 NOTE — Patient Instructions (Signed)
Your procedure is scheduled on:  Thursday, Jun 16, 2016  Enter through the [employer] [facility] at:  9:00 AM  Pick up the phone at the desk and dial [phone].  Call this number if you have problems the morning of surgery: [phone].  Remember: Do NOT eat food or drink after:  Midnight Wednesday  Take these medicines the morning of surgery with a SIP OF WATER:  None  Stop ALL herbal medications at this time  Do NOT smoke the day of surgery.  Do NOT wear jewelry (body piercing), metal hair clips/bobby pins, make-up, or nail polish. Do NOT wear lotions, powders, or perfumes.  You may wear deodorant. Do NOT shave for 48 hours prior to surgery. Do NOT bring valuables to the hospital. Contacts, dentures, or bridgework may not be worn into surgery.  Leave suitcase in car.  After surgery it may be brought to your room.  For patients admitted to the hospital, checkout time is 11:00 AM the day of discharge.  Bring a copy of your healthcare power of attorney and living will documents.

## 2016-06-07 NOTE — H&P (Signed)
[name] is a 83 y.o. female P: 3-0-0-3 who is S/P hysterectomy and  presents for anterior colporrhaphy and placement of tension free vaginal tape because of  urinary incontinence and recurrent cystocele.  In 1999 the patient underwent  bladder support surgery  and managed well since then until a month ago when she developed pelvic pressure with occasional pain, urinary frequency and problems making it to the bathroom in time.  She noticed with walking and other activities that her symptoms worsened-feeling like her bottom is falling out.  She denies dysuria, hematuria, lower back pain or changes in her bowel function.  On May 24, 2016 the patient had urodynamic studies done that showed stress urinary incontinence with intrinsic sphincter dysfunction.  The patient was given both medical and surgical management options for her symptoms, however,  she wants to proceed with anterior colporrhaphy and placement of tension free vaginal tape.   Past Medical History  OB History: G: 3;  P: 3-0-0-3;  SVB,  1952, 1955 and 1956 with largest infant weighing 8 lbs.  GYN History: menarche: 83 YO      Denies history of abnormal PAP smear.   Last PAP smear: 2008-normal  Medical History: Macular Degeneration, Urinary Incontinence and Vulvovaginal Atrophy  Surgical History:  1999  Bladder Support Surgery;  2006  Right Rotator Cuff Surgery;  2013 Total Abdominal Hysterectomy and 2016 Right Hand Trigger Finger Surgery Denies problems with anesthesia or history of blood transfusions  Family History: Uterine Cancer  Social History:  Married and Denies tobacco or alcohol use   Medications: Probiotics daily Over the Counter Vitamin D daily  Allergies  Allergen Reactions  . Ivp Dye [Iodinated Diagnostic Agents] Hives    Denies sensitivity to peanuts, shellfish, soy, latex or adhesives.  ROS: Admits to reading  glasses, retainer on bottom teeth, left leg swelling (previously evaluated with negative DVT) but    denies headache,  nasal congestion, dysphagia, tinnitus, dizziness, hoarseness, cough,  chest pain, shortness of breath, nausea, vomiting, diarrhea,constipation,  urinary frequency, urgency  dysuria, hematuria, vaginitis symptoms, pelvic pain, ,easy bruising,  myalgias, arthralgias, skin rashes, unexplained weight loss and except as is mentioned in the history of present illness, patient's review of systems is otherwise negative.    Physical Exam  Bp: 132/62   P: 84 bpm   R:16  Temperature:  98.1 degree F orally   Height: 5\' 3"    Weight:  138 lbs.   BMI:  24.4   Neck: supple without masses or thyromegaly Lungs: clear to auscultation Heart: regular rate and rhythm Abdomen: soft, non-tender and no organomegaly Pelvic:EGBUS- wnl; vagina-atrophic with 3/4 cystocele; uterus/cervix: surgically absent and  adnexae-no tenderness or masses Extremities:  no clubbing, cyanosis or edema   Assesment:  Stress Urinary Incontinence                        Cystocele   Disposition:  A discussion was held with patient regarding the indication for her procedure(s) along with the risks, which include but are not limited to: reaction to anesthesia, damage to adjacent organs, infection, worsening of symptoms, erosion of tension free vaginal tape  and excessive bleeding.  the patient verbalized understanding of these risks and has consented to proceed with Anterior Colporrhaphy, Placement of Tension Free Vaginal Tape and Cystoscopy at [place] on Jun 16, 2016.   CSN# [id number]   [place] J. Florene Glen, PA-C  for [employer]. Mancel Bale   She has  actually been reporting these sxs to me for several yrs but it only has become bother some to the point she desired treatment over the last 1 or 42mths.  I discussed at length with the pt the r/b/a icluding but not limited to bleeding, infection and injury, risk or urinary retention and risk of erosion of mesh.  She is in agreement with proceeding and her  questions were answered.

## 2016-06-16 ENCOUNTER — Inpatient Hospital Stay (HOSPITAL_COMMUNITY): Payer: Medicare Other | Admitting: Anesthesiology

## 2016-06-16 ENCOUNTER — Encounter (HOSPITAL_COMMUNITY)
Admission: AD | Disposition: A | Discharge status: Home / Self Care | Payer: Self-pay | Source: Ambulatory Visit | Attending: Obstetrics and Gynecology

## 2016-06-16 ENCOUNTER — Encounter (HOSPITAL_COMMUNITY): Payer: Self-pay

## 2016-06-16 ENCOUNTER — Observation Stay (HOSPITAL_COMMUNITY)
Admission: AD | Admit: 2016-06-16 | Discharge: 2016-06-17 | Disposition: A | Discharge status: Home / Self Care | Payer: Medicare Other | Source: Ambulatory Visit | Attending: Obstetrics and Gynecology | Admitting: Obstetrics and Gynecology

## 2016-06-16 DIAGNOSIS — Z90710 Acquired absence of both cervix and uterus: Secondary | ICD-10-CM | POA: Insufficient documentation

## 2016-06-16 DIAGNOSIS — N8110 Cystocele, unspecified: Secondary | ICD-10-CM | POA: Insufficient documentation

## 2016-06-16 DIAGNOSIS — N393 Stress incontinence (female) (male): Principal | ICD-10-CM | POA: Diagnosis present

## 2016-06-16 DIAGNOSIS — R32 Unspecified urinary incontinence: Secondary | ICD-10-CM | POA: Diagnosis present

## 2016-06-16 HISTORY — PX: CYSTOSCOPY: SHX5120

## 2016-06-16 HISTORY — PX: BLADDER SUSPENSION: SHX72

## 2016-06-16 HISTORY — PX: CYSTOCELE REPAIR: SHX163

## 2016-06-16 HISTORY — DX: Solitary cyst of left breast: N60.02

## 2016-06-16 SURGERY — COLPORRHAPHY, ANTERIOR, FOR CYSTOCELE REPAIR
Anesthesia: Spinal | Site: Vagina

## 2016-06-16 MED ORDER — FENTANYL CITRATE (PF) 100 MCG/2ML IJ SOLN
INTRAMUSCULAR | Status: AC
  Filled 2016-06-16: qty 2

## 2016-06-16 MED ORDER — CIPROFLOXACIN HCL 250 MG PO TABS
250.0000 mg | ORAL_TABLET | Freq: Two times a day (BID) | ORAL | Status: DC
  Filled 2016-06-16 (×2): qty 1

## 2016-06-16 MED ORDER — HYDROCODONE-ACETAMINOPHEN 5-325 MG PO TABS
1.0000 | ORAL_TABLET | Freq: Four times a day (QID) | ORAL | Status: DC | PRN
  Administered 2016-06-16 – 2016-06-17 (×2): 1 via ORAL
  Filled 2016-06-16 (×2): qty 1

## 2016-06-16 MED ORDER — CEFAZOLIN SODIUM-DEXTROSE 2-3 GM-% IV SOLR
INTRAVENOUS | Status: DC | PRN
  Administered 2016-06-16: 2 g via INTRAVENOUS

## 2016-06-16 MED ORDER — SODIUM CHLORIDE 0.9 % IJ SOLN
INTRAMUSCULAR | Status: AC
  Filled 2016-06-16: qty 100

## 2016-06-16 MED ORDER — BUPIVACAINE IN DEXTROSE 0.75-8.25 % IT SOLN
INTRATHECAL | Status: DC | PRN
  Administered 2016-06-16: 1.6 mL via INTRATHECAL

## 2016-06-16 MED ORDER — ACETAMINOPHEN 325 MG PO TABS: 325.0000 mg | ORAL_TABLET | ORAL | Status: DC | PRN

## 2016-06-16 MED ORDER — KETOROLAC TROMETHAMINE 15 MG/ML IJ SOLN
15.0000 mg | Freq: Three times a day (TID) | INTRAMUSCULAR | Status: DC
  Administered 2016-06-16 – 2016-06-17 (×2): 15 mg via INTRAVENOUS
  Filled 2016-06-16 (×3): qty 1

## 2016-06-16 MED ORDER — VASOPRESSIN 20 UNIT/ML IV SOLN
INTRAVENOUS | Status: DC | PRN
  Administered 2016-06-16: 19 mL via INTRAMUSCULAR

## 2016-06-16 MED ORDER — PROPOFOL 10 MG/ML IV BOLUS
INTRAVENOUS | Status: AC
  Filled 2016-06-16: qty 20

## 2016-06-16 MED ORDER — PROPOFOL 500 MG/50ML IV EMUL
INTRAVENOUS | Status: DC | PRN
Start: 2016-06-16 — End: 2016-06-16
  Administered 2016-06-16: 50 ug/kg/min via INTRAVENOUS

## 2016-06-16 MED ORDER — LACTATED RINGERS IV SOLN
INTRAVENOUS | Status: DC
  Administered 2016-06-16: 18:00:00 via INTRAVENOUS
  Administered 2016-06-16: 100 mL/h via INTRAVENOUS
  Administered 2016-06-17: via INTRAVENOUS

## 2016-06-16 MED ORDER — IBUPROFEN 600 MG PO TABS: 600.0000 mg | ORAL_TABLET | Freq: Four times a day (QID) | ORAL | Status: DC | PRN

## 2016-06-16 MED ORDER — ACETAMINOPHEN 160 MG/5ML PO SOLN
325.0000 mg | ORAL | Status: DC | PRN
  Administered 2016-06-16: 325 mg via ORAL

## 2016-06-16 MED ORDER — KETOROLAC TROMETHAMINE 15 MG/ML IJ SOLN
15.0000 mg | Freq: Once | INTRAMUSCULAR | Status: DC
  Filled 2016-06-16: qty 1

## 2016-06-16 MED ORDER — BUPIVACAINE IN DEXTROSE 0.75-8.25 % IT SOLN
INTRATHECAL | Status: AC
Start: 2016-06-16 — End: 2016-06-16
  Filled 2016-06-16: qty 2

## 2016-06-16 MED ORDER — ESTRADIOL 0.1 MG/GM VA CREA
TOPICAL_CREAM | VAGINAL | Status: DC | PRN
  Administered 2016-06-16: 1 via VAGINAL

## 2016-06-16 MED ORDER — VASOPRESSIN 20 UNIT/ML IV SOLN
INTRAVENOUS | Status: AC
  Filled 2016-06-16: qty 1

## 2016-06-16 MED ORDER — KETOROLAC TROMETHAMINE 30 MG/ML IJ SOLN
INTRAMUSCULAR | Status: AC
  Filled 2016-06-16: qty 1

## 2016-06-16 MED ORDER — PROPOFOL 10 MG/ML IV BOLUS
INTRAVENOUS | Status: AC
Start: 2016-06-16 — End: 2016-06-16
  Filled 2016-06-16: qty 20

## 2016-06-16 MED ORDER — STERILE WATER FOR IRRIGATION IR SOLN
Status: DC | PRN
  Administered 2016-06-16: 1000 mL

## 2016-06-16 MED ORDER — DOCUSATE SODIUM 100 MG PO CAPS
100.0000 mg | ORAL_CAPSULE | Freq: Two times a day (BID) | ORAL | Status: DC
  Administered 2016-06-16 – 2016-06-17 (×2): 100 mg via ORAL
  Filled 2016-06-16 (×2): qty 1

## 2016-06-16 MED ORDER — LIDOCAINE HCL (CARDIAC) 20 MG/ML IV SOLN
INTRAVENOUS | Status: AC
  Filled 2016-06-16: qty 5

## 2016-06-16 MED ORDER — ONDANSETRON HCL 4 MG/2ML IJ SOLN: 4.0000 mg | Freq: Once | INTRAMUSCULAR | Status: DC | PRN

## 2016-06-16 MED ORDER — KETOROLAC TROMETHAMINE 30 MG/ML IJ SOLN
INTRAMUSCULAR | Status: DC | PRN
  Administered 2016-06-16: 15 mg via INTRAVENOUS

## 2016-06-16 MED ORDER — MEPERIDINE HCL 25 MG/ML IJ SOLN: 6.2500 mg | INTRAMUSCULAR | Status: DC | PRN

## 2016-06-16 MED ORDER — ONDANSETRON HCL 4 MG/2ML IJ SOLN
INTRAMUSCULAR | Status: AC
  Filled 2016-06-16: qty 2

## 2016-06-16 MED ORDER — MENTHOL 3 MG MT LOZG: 1.0000 | LOZENGE | OROMUCOSAL | Status: DC | PRN

## 2016-06-16 MED ORDER — ONDANSETRON HCL 4 MG/2ML IJ SOLN
INTRAMUSCULAR | Status: DC | PRN
  Administered 2016-06-16: 4 mg via INTRAVENOUS

## 2016-06-16 MED ORDER — LACTATED RINGERS IV SOLN
Freq: Once | INTRAVENOUS | Status: AC
  Administered 2016-06-16: 10:00:00 via INTRAVENOUS

## 2016-06-16 MED ORDER — FENTANYL CITRATE (PF) 100 MCG/2ML IJ SOLN
INTRAMUSCULAR | Status: DC | PRN
  Administered 2016-06-16: 25 ug via INTRAVENOUS

## 2016-06-16 MED ORDER — ACETAMINOPHEN 160 MG/5ML PO SOLN
ORAL | Status: AC
  Filled 2016-06-16: qty 20.3

## 2016-06-16 MED ORDER — KETOROLAC TROMETHAMINE 15 MG/ML IJ SOLN
15.0000 mg | Freq: Once | INTRAMUSCULAR | Status: AC
  Administered 2016-06-16: 15 mg via INTRAVENOUS
  Filled 2016-06-16: qty 1

## 2016-06-16 MED ORDER — LACTATED RINGERS IV SOLN
INTRAVENOUS | Status: DC | PRN
  Administered 2016-06-16: 11:00:00 via INTRAVENOUS

## 2016-06-16 MED ORDER — ESTRADIOL 0.1 MG/GM VA CREA
TOPICAL_CREAM | VAGINAL | Status: AC
Start: 2016-06-16 — End: 2016-06-16
  Filled 2016-06-16: qty 42.5

## 2016-06-16 SURGICAL SUPPLY — 43 items
BLADE SURG 11 STRL SS (BLADE) IMPLANT
BLADE SURG 15 STRL LF C SS BP (BLADE) ×2 IMPLANT
BLADE SURG 15 STRL SS (BLADE) ×1
CANISTER SUCT 3000ML PPV (MISCELLANEOUS) ×3 IMPLANT
CLOTH BEACON ORANGE TIMEOUT ST (SAFETY) ×3 IMPLANT
COUNTER NEEDLE 1200 MAGNETIC (NEEDLE) ×3 IMPLANT
DECANTER SPIKE VIAL GLASS SM (MISCELLANEOUS) ×3 IMPLANT
DERMABOND ADVANCED (GAUZE/BANDAGES/DRESSINGS) ×1
DERMABOND ADVANCED .7 DNX12 (GAUZE/BANDAGES/DRESSINGS) ×2 IMPLANT
DRAPE SHEET LG 3/4 BI-LAMINATE (DRAPES) ×3 IMPLANT
GAUZE PACKING 1 X5 YD ST (GAUZE/BANDAGES/DRESSINGS) ×3 IMPLANT
GAUZE PACKING 2X5 YD STRL (GAUZE/BANDAGES/DRESSINGS) IMPLANT
GLOVE BIO SURGEON STRL SZ 6.5 (GLOVE) ×6 IMPLANT
GLOVE BIO SURGEON STRL SZ7.5 (GLOVE) ×6 IMPLANT
GLOVE BIOGEL PI IND STRL 7.0 (GLOVE) ×6 IMPLANT
GLOVE BIOGEL PI IND STRL 7.5 (GLOVE) ×6 IMPLANT
GLOVE BIOGEL PI INDICATOR 7.0 (GLOVE) ×3
GLOVE BIOGEL PI INDICATOR 7.5 (GLOVE) ×3
GOWN STRL REUS W/TWL LRG LVL3 (GOWN DISPOSABLE) ×9 IMPLANT
NEEDLE HYPO 22GX1.5 SAFETY (NEEDLE) ×3 IMPLANT
NS IRRIG 1000ML POUR BTL (IV SOLUTION) ×3 IMPLANT
PACK VAGINAL WOMENS (CUSTOM PROCEDURE TRAY) ×3 IMPLANT
SET CYSTO W/LG BORE CLAMP LF (SET/KITS/TRAYS/PACK) ×3 IMPLANT
SLING TRANS VAGINAL TAPE (Sling) ×1 IMPLANT
SLING UTERINE/ABD GYNECARE TVT (Sling) ×2 IMPLANT
SUT MNCRL AB 3-0 PS2 27 (SUTURE) IMPLANT
SUT MNCRL AB 4-0 PS2 18 (SUTURE) IMPLANT
SUT VIC AB 0 CT1 18XCR BRD8 (SUTURE) IMPLANT
SUT VIC AB 0 CT1 27 (SUTURE) ×1
SUT VIC AB 0 CT1 27XBRD ANBCTR (SUTURE) ×2 IMPLANT
SUT VIC AB 0 CT1 8-18 (SUTURE)
SUT VIC AB 1 CT1 36 (SUTURE) IMPLANT
SUT VIC AB 2-0 CT1 (SUTURE) IMPLANT
SUT VIC AB 2-0 CT1 27 (SUTURE)
SUT VIC AB 2-0 CT1 TAPERPNT 27 (SUTURE) IMPLANT
SUT VIC AB 2-0 SH 27 (SUTURE) ×2
SUT VIC AB 2-0 SH 27XBRD (SUTURE) ×4 IMPLANT
SUT VIC AB 3-0 SH 27 (SUTURE)
SUT VIC AB 3-0 SH 27X BRD (SUTURE) IMPLANT
TOWEL OR 17X24 6PK STRL BLUE (TOWEL DISPOSABLE) ×6 IMPLANT
TRAY FOLEY CATH SILVER 14FR (SET/KITS/TRAYS/PACK) ×3 IMPLANT
TUBING CONNECTOR 18X5MM (MISCELLANEOUS) ×3 IMPLANT
YANKAUER SUCT BULB TIP NO VENT (SUCTIONS) ×3 IMPLANT

## 2016-06-16 NOTE — Interval H&P Note (Deleted)
History and Physical Interval Note:  06/16/2016 10:42 AM  Newborn  has presented today for surgery, with the diagnosis of Cystocele 90 minutes  The various methods of treatment have been discussed with the patient and family. After consideration of risks, benefits and other options for treatment, the patient has consented to  Procedure(s) with comments: ANTERIOR REPAIR (CYSTOCELE) (N/A) TRANSVAGINAL TAPE (TVT) PROCEDURE (N/A) - THIS IS A TVT REPAIR  CYSTOSCOPY (N/A) - see anterior repair as a surgical intervention .  The patient's history has been reviewed, patient examined, no change in status, stable for surgery.  I have reviewed the patient's chart and labs.  Questions were answered to the patient's satisfaction.     Delice Lesch

## 2016-06-16 NOTE — Interval H&P Note (Signed)
History and Physical Interval Note:  06/16/2016 10:54 AM  [place]  has presented today for surgery, with the diagnosis of Cystocele  The various methods of treatment have been discussed with the patient and family. After consideration of risks, benefits and other options for treatment, the patient has consented to  Procedure(s) with comments: ANTERIOR REPAIR (CYSTOCELE) (N/A) TRANSVAGINAL TAPE (TVT - TENSION FREE VAGINAL TAPE) PROCEDURE (N/A) -   CYSTOSCOPY (N/A) - see anterior repair as a surgical intervention .  The patient's history has been reviewed, patient examined, no change in status, stable for surgery.  I have reviewed the patient's chart and labs.  Questions were answered to the patient's satisfaction.     Delice Lesch

## 2016-06-16 NOTE — Anesthesia Preprocedure Evaluation (Addendum)
Anesthesia Evaluation  Patient identified by MRN, date of birth, ID band Patient awake    Reviewed: Allergy & Precautions, NPO status , Patient's Chart, lab work & pertinent test results  Airway Mallampati: I       Dental no notable dental hx.    Pulmonary    Pulmonary exam normal breath sounds clear to auscultation       Cardiovascular Normal cardiovascular exam Rhythm:Regular Rate:Normal  2009 strees test showed nl EF with no p(erfusion abnormalities   Neuro/Psych negative psych ROS   GI/Hepatic Patient received Oral Contrast Agents,  Endo/Other  negative endocrine ROS  Renal/GU      Musculoskeletal   Abdominal Normal abdominal exam  (+)   Peds  Hematology   Anesthesia Other Findings   Reproductive/Obstetrics                            Anesthesia Physical Anesthesia Plan  ASA: II  Anesthesia Plan: Spinal   Post-op Pain Management:    Induction:   Airway Management Planned: Natural Airway, Simple Face Mask and Nasal Cannula  Additional Equipment:   Intra-op Plan:   Post-operative Plan:   Informed Consent: I have reviewed the patients History and Physical, chart, labs and discussed the procedure including the risks, benefits and alternatives for the proposed anesthesia with the patient or authorized representative who has indicated his/her understanding and acceptance.     Plan Discussed with: CRNA and Surgeon  Anesthesia Plan Comments:        Anesthesia Quick Evaluation

## 2016-06-16 NOTE — Progress Notes (Signed)
Foley flushed via port with 30cc saline, 115cc yellow slightly pink tinge urine returned into foley bag. IV rate increased to 125cc/hr per order. No fluid bolus given.

## 2016-06-16 NOTE — Anesthesia Postprocedure Evaluation (Signed)
Anesthesia Post Note  Patient: [name]  Procedure(s) Performed: Procedure(s) (LRB): ANTERIOR REPAIR (CYSTOCELE) (N/A) TRANSVAGINAL TAPE (TVT) PROCEDURE (N/A) CYSTOSCOPY (N/A)     Anesthesia Post Evaluation  Last Vitals:  Vitals:   06/16/16 1515 06/16/16 1530  BP: (!) 144/63 (!) 157/68  Pulse: (!) 58 66  Resp: (!) 23 (!) 22  Temp:      Last Pain:  Vitals:   06/16/16 1515  TempSrc:   PainSc: 3    Pain Goal: Patients Stated Pain Goal: 4 (06/16/16 1515)               [place]

## 2016-06-16 NOTE — Progress Notes (Signed)
Day of Surgery Procedure(s) (LRB): ANTERIOR REPAIR (CYSTOCELE) (N/A) TRANSVAGINAL TAPE (TVT) PROCEDURE (N/A) CYSTOSCOPY (N/A)  Subjective: Patient reports pain in abdomen.  Otherwise she is doing fine.  Pt tolerated regular diet.    Objective: I have reviewed patient's vital signs and intake and output.  UOP 200cc/4hrs  General: alert and no distress Resp: clear to auscultation bilaterally Cardio: regular rate and rhythm GI: soft, NABS, app tender, ND, no rebound Extremities: no calf tenderness, SCDs are on Vaginal Bleeding: none packing in place  Assessment: s/p Procedure(s) with comments: ANTERIOR REPAIR (CYSTOCELE) (N/A) TRANSVAGINAL TAPE (TVT) PROCEDURE (N/A) CYSTOSCOPY (N/A) - see anterior repair: stable  Plan: tolerating regular diet  Urine concentrated but adequate SCDs for DVT prophylaxis Foley readjusted with return of almost 100cc and pt reports abdomen feels much better.  She is also uncomfortable due to the packing. Encourage IS CBC in am   LOS: 1 day    [name] Y 06/16/2016, 8:56 PM

## 2016-06-16 NOTE — Progress Notes (Signed)
Note to Pharmacy:  Patient receiving prophylactic  antibiotics  (Cipro)  due to repeated bladder instrumentation during surgical procedure.  E.Madoc Holquin, PA-C

## 2016-06-16 NOTE — Anesthesia Postprocedure Evaluation (Signed)
Anesthesia Post Note  Patient: [name]  Procedure(s) Performed: Procedure(s) (LRB): ANTERIOR REPAIR (CYSTOCELE) (N/A) TRANSVAGINAL TAPE (TVT) PROCEDURE (N/A) CYSTOSCOPY (N/A)     Patient location during evaluation: PACU Anesthesia Type: Spinal Level of consciousness: awake Pain management: pain level controlled Vital Signs Assessment: post-procedure vital signs reviewed and stable Respiratory status: spontaneous breathing Cardiovascular status: stable Postop Assessment: no headache, no backache, spinal receding, patient able to bend at knees and no signs of nausea or vomiting Anesthetic complications: no    Last Vitals:  Vitals:   06/16/16 1515 06/16/16 1530  BP: (!) 144/63 (!) 157/68  Pulse: (!) 58 66  Resp: (!) 23 (!) 22  Temp:      Last Pain:  Vitals:   06/16/16 1515  TempSrc:   PainSc: 3    Pain Goal: Patients Stated Pain Goal: 4 (06/16/16 1515)               [place]

## 2016-06-16 NOTE — Discharge Instructions (Signed)
Call [facility] @ [phone] if: ° °You have a temperature greater than or equal to 100.4 degrees Farenheit orally °You have pain that is not made better by the pain medication given and taken as directed °You have excessive bleeding or problems urinating ° °Take Colace (Docusate Sodium/Stool Softener) 100 mg 2-3 times daily while taking narcotic pain medicine to avoid constipation or until bowel movements are regular. ° °You may drive after 2 weeks °You may walk up steps ° °You may shower  °You may resume a regular diet ° °Keep incisions clean and dry °Do not lift over 15 pounds for 6 weeks °Avoid anything in vagina for 6 weeks  ° °

## 2016-06-16 NOTE — Transfer of Care (Signed)
Immediate Anesthesia Transfer of Care Note  Patient: [name]  Procedure(s) Performed: Procedure(s) with comments: ANTERIOR REPAIR (CYSTOCELE) (N/A) TRANSVAGINAL TAPE (TVT) PROCEDURE (N/A) CYSTOSCOPY (N/A) - see anterior repair  Patient Location: PACU  Anesthesia Type:Spinal  Level of Consciousness: awake, alert  and patient cooperative  Airway & Oxygen Therapy: Patient Spontanous Breathing  Post-op Assessment: Report given to RN and Post -op Vital signs reviewed and stable  Post vital signs: Reviewed and stable  Last Vitals:  Vitals:   06/16/16 0913  BP: (!) 158/83  Pulse: 73  Resp: 16  Temp: 36.3 C    Last Pain:  Vitals:   06/16/16 0913  TempSrc: Oral      Patients Stated Pain Goal: 3 (93/90/30 0923)  Complications: No apparent anesthesia complications

## 2016-06-16 NOTE — Op Note (Signed)
Preop Diagnosis: 1.Symptomatic Cystocele 2.Urinary Incontinence  Postop Diagnosis: 1.Symptomatic Cystocele 2.Urinary Incontinence  Procedure: 1.ANTERIOR REPAIR 2.TENSION FREE VAGINAL TAPE 3.CYSTOSCOPY  Anesthesia: General   Attending: Everett Graff, MD   Assistant: Earnstine Regal, PA-C  Findings: Cystocele  Pathology: N/a  Fluids: 800 cc  UOP: 300 cc  EBL: 25 cc  Complications: None  Procedure: The patient was taken to the operating room after the risks, benefits and alternatives were discussed with the patient, the patient verbalized understanding and consent signed and witnessed. A timeout was performed per protocol. The patient was prepped and draped in the normal sterile fashion in the dorsal lithotomy position and the patient was given a spinal per the anesthesiologist.  A weighted speculum was placed in the patient's vagina and the anterior vaginal wall was injected with dilute pitressin at a concentration of 20 units of pitressin in a total of 50cc of normal saline.  An incision was made in the anterior wall of the vagina for approximately 1cm beneath the midurethra and the underlying tissue was dissected away from the anterior vaginal wall down to the level of the lower symphysis pubis bilaterally. Attention was then turned to the mons pubis where two 5 mm incisions were made 2 fingerbreadths from the midline. The transabdominal guide was then passed through the mons pubis incision on the patient's right down through the space of Retzius and out through the anterior vaginal wall after deflecting the rigid urethral catheter guide to the ipsilateral side. The same was done on the contralateral side. Cystoscopy was performed and no invadvertant bladder injury was noted. The bladder was drained with a Foley while deflecting the rigid urethral catheter guide to the patient's right and the mesh was attached to the transabdominal guide and elevated up through the space of  Retzius and out through the incision on the mons pubis on the ipsilateral side. The same was done on the contralateral side. Cystoscopy was performed again and no inadvertant bladder injury was noted. The 66 French Foley was left in the urethra and a large Claiborne Billings was placed between the urethra and the mesh in order to leave the mesh slack beneath the midurethra. The mesh was then cut flush with the skin at the mons pubis incisions bilaterally.    The anterior vaginal wall was injected with dilute pitressin and the anterior vaginal wall was then incised and dissected away from the underlying layer of tissue. The cystocele was repaired with a purse string stitch of 2-0 vicryl and then plication stitches of 2-0 vicryl were placed as well.The bilateral incisions on the mons pubis were then cleaned and Dermabond applied. The anterior vaginal wall incision was repaired with 2-0 vicryl with interrupted stitches and the over the cystocele the anterior vaginal wall was repaired with interlocking stitch of 2-0 vicryl.  The vagina was packed with estrogen soaked vaginal packing.  The patient tolerated the procedure well and was awaiting return to the recovery room in good condition.  Sponge, lap and needle count was correct.  The patient tolerated the procedure well and was returned to the recovery room in good condition.

## 2016-06-16 NOTE — Anesthesia Procedure Notes (Signed)
Spinal  Start time: 06/16/2016 11:10 AM End time: 06/16/2016 11:14 AM Staffing Anesthesiologist: Lyn Hollingshead Performed: anesthesiologist  Preanesthetic Checklist Completed: patient identified, surgical consent, pre-op evaluation, timeout performed, IV checked, risks and benefits discussed and monitors and equipment checked Spinal Block Prep: site prepped and draped and DuraPrep Approach: midline Location: L3-4 Needle Needle type: Pencan  Needle gauge: 24 G Needle length: 10 cm Needle insertion depth: 6 cm Assessment Sensory level: T6

## 2016-06-17 ENCOUNTER — Encounter (HOSPITAL_COMMUNITY): Payer: Self-pay | Admitting: Obstetrics and Gynecology

## 2016-06-17 DIAGNOSIS — N393 Stress incontinence (female) (male): Secondary | ICD-10-CM | POA: Diagnosis not present

## 2016-06-17 LAB — CBC
HCT: 31.2 % — ABNORMAL LOW (ref 36.0–46.0)
Hemoglobin: 11.3 g/dL — ABNORMAL LOW (ref 12.0–15.0)
MCH: 30.5 pg (ref 26.0–34.0)
MCHC: 36.2 g/dL — ABNORMAL HIGH (ref 30.0–36.0)
MCV: 84.1 fL (ref 78.0–100.0)
Platelets: 178 10*3/uL (ref 150–400)
RBC: 3.71 MIL/uL — ABNORMAL LOW (ref 3.87–5.11)
RDW: 13.7 % (ref 11.5–15.5)
WBC: 8.1 10*3/uL (ref 4.0–10.5)

## 2016-06-17 MED ORDER — HYDROCODONE-ACETAMINOPHEN 5-325 MG PO TABS: 1.0000 | ORAL_TABLET | Freq: Four times a day (QID) | ORAL | 0 refills | Status: DC | PRN

## 2016-06-17 MED ORDER — IBUPROFEN 600 MG PO TABS: 600.0000 mg | ORAL_TABLET | Freq: Three times a day (TID) | ORAL | 0 refills | Status: DC | PRN

## 2016-06-17 MED ORDER — CIPROFLOXACIN HCL 250 MG PO TABS: 250.0000 mg | ORAL_TABLET | Freq: Two times a day (BID) | ORAL | 0 refills | Status: DC

## 2016-06-17 NOTE — Progress Notes (Signed)
Discharge teaching complete with pt and family. Pt understood all information and did not have any questions. Pt discharged home to family.

## 2016-06-17 NOTE — Discharge Summary (Signed)
Physician Discharge Summary  Patient ID: [name] MRN: [id number] DOB/AGE: 02/19/1927 83 y.o.  Admit date: 06/16/2016 Discharge date: 06/17/2016  Admission Diagnoses: Urinary incontinence, stress  Discharge Diagnoses:  Active Problems:   Urinary, incontinence, stress female   Discharged Condition: good  Hospital Course: Pt was admitted for TVT/Cystoscopy due to SUI.  She is voiding spontaneously without difficulty, tolerating po, ambulating without difficulty and pain controlled with oral pain meds.  I reviewed discharge instructions and will have pt f/u in 2wks and 6wks.  Consults: None  Significant Diagnostic Studies: n/a  Treatments: IV hydration, surgery: TVT/Cystoscopy and observation  Discharge Exam: Blood pressure (!) 111/52, pulse 72, temperature 98.2 F (36.8 C), temperature source Oral, resp. rate 16, height 5\' 4"  (1.626 m), weight 138 lb 2 oz (62.7 kg), SpO2 95 %. General appearance: alert and no distress Resp: clear to auscultation bilaterally Cardio: regular rate and rhythm GI: soft, non-tender; bowel sounds normal; no masses,  no organomegaly, incisions dressed with dermabond, c/d Extremities: Homans sign is negative, no sign of DVT minimal vaginal bleeding  Disposition: 01-Home or Self Care   Allergies as of 06/17/2016      Reactions   Ivp Dye [iodinated Diagnostic Agents] Hives      Medication List    TAKE these medications   acetaminophen 500 MG tablet Commonly known as:  TYLENOL Take 1,000 mg by mouth every 8 (eight) hours as needed for mild pain.   chlorpheniramine 4 MG tablet Commonly known as:  CHLOR-TRIMETON Take 4 mg by mouth daily as needed for allergies.   cholecalciferol 1000 units tablet Commonly known as:  VITAMIN D Take 1,000 Units by mouth daily.   ciprofloxacin 250 MG tablet Commonly known as:  CIPRO Take 1 tablet (250 mg total) by mouth every 12 (twelve) hours. For 3 days   CULTURELLE Caps Take 1 tablet by mouth daily.    HAIR VITAMINS Tabs Take 1 tablet by mouth 3 (three) times a week. Hair supplement called Hair Volume   HYDROcodone-acetaminophen 5-325 MG tablet Commonly known as:  NORCO/VICODIN Take 1 tablet by mouth every 6 (six) hours as needed for moderate pain.   ibuprofen 600 MG tablet Commonly known as:  ADVIL,MOTRIN Take 1 tablet (600 mg total) by mouth every 8 (eight) hours as needed (mild pain).   Magnesium 250 MG Tabs Take 250 mg by mouth daily as needed (leg cramps).   multivitamin with minerals Tabs tablet Take 1 tablet by mouth daily.   UNABLE TO FIND Take 1 tablet by mouth daily. Eye Supplement for Macular Degeneration  With lutein and bilberry antioxidants   Vitamin B-12 3000 MCG Subl Place 3,000 mcg under the tongue 3 (three) times a week.      Follow-up Information    Everett Graff, MD Follow up on 07/25/2016.   Specialty:  Obstetrics and Gynecology Why:  appointment time is 1:30 p.m. Contact information: [address] [place] [address] [phone]           Signed: Delice Lesch 06/17/2016, 12:52 PM

## 2016-09-07 ENCOUNTER — Encounter (HOSPITAL_COMMUNITY): Payer: Self-pay

## 2016-09-07 ENCOUNTER — Emergency Department (HOSPITAL_COMMUNITY): Payer: Medicare Other

## 2016-09-07 ENCOUNTER — Emergency Department (HOSPITAL_COMMUNITY)
Admission: EM | Admit: 2016-09-07 | Discharge: 2016-09-07 | Disposition: A | Discharge status: Home / Self Care | Payer: Medicare Other | Attending: Emergency Medicine | Admitting: Emergency Medicine

## 2016-09-07 DIAGNOSIS — I10 Essential (primary) hypertension: Secondary | ICD-10-CM | POA: Insufficient documentation

## 2016-09-07 DIAGNOSIS — R0600 Dyspnea, unspecified: Secondary | ICD-10-CM | POA: Diagnosis present

## 2016-09-07 DIAGNOSIS — J208 Acute bronchitis due to other specified organisms: Secondary | ICD-10-CM | POA: Diagnosis not present

## 2016-09-07 DIAGNOSIS — Z79899 Other long term (current) drug therapy: Secondary | ICD-10-CM | POA: Insufficient documentation

## 2016-09-07 DIAGNOSIS — N3000 Acute cystitis without hematuria: Secondary | ICD-10-CM | POA: Diagnosis not present

## 2016-09-07 LAB — URINALYSIS, ROUTINE W REFLEX MICROSCOPIC
Bacteria, UA: NONE SEEN
Bilirubin Urine: NEGATIVE
Glucose, UA: 50 mg/dL — AB
Hgb urine dipstick: NEGATIVE
Ketones, ur: NEGATIVE mg/dL
Leukocytes, UA: NEGATIVE
Nitrite: NEGATIVE
Protein, ur: 30 mg/dL — AB
Specific Gravity, Urine: 1.02 (ref 1.005–1.030)
pH: 6 (ref 5.0–8.0)

## 2016-09-07 LAB — CBC
HCT: 35 % — ABNORMAL LOW (ref 36.0–46.0)
Hemoglobin: 12.4 g/dL (ref 12.0–15.0)
MCH: 29.5 pg (ref 26.0–34.0)
MCHC: 35.4 g/dL (ref 30.0–36.0)
MCV: 83.1 fL (ref 78.0–100.0)
Platelets: 193 10*3/uL (ref 150–400)
RBC: 4.21 MIL/uL (ref 3.87–5.11)
RDW: 13.7 % (ref 11.5–15.5)
WBC: 9.9 10*3/uL (ref 4.0–10.5)

## 2016-09-07 LAB — I-STAT TROPONIN, ED: Troponin i, poc: 0.01 ng/mL (ref 0.00–0.08)

## 2016-09-07 LAB — BASIC METABOLIC PANEL
Anion gap: 12 (ref 5–15)
BUN: 14 mg/dL (ref 6–20)
CO2: 24 mmol/L (ref 22–32)
Calcium: 9.1 mg/dL (ref 8.9–10.3)
Chloride: 100 mmol/L — ABNORMAL LOW (ref 101–111)
Creatinine, Ser: 1.15 mg/dL — ABNORMAL HIGH (ref 0.44–1.00)
GFR calc Af Amer: 48 mL/min — ABNORMAL LOW (ref >60)
GFR calc non Af Amer: 42 mL/min — ABNORMAL LOW (ref >60)
Glucose, Bld: 199 mg/dL — ABNORMAL HIGH (ref 65–99)
Potassium: 4.3 mmol/L (ref 3.5–5.1)
Sodium: 136 mmol/L (ref 135–145)

## 2016-09-07 MED ORDER — SULFAMETHOXAZOLE-TRIMETHOPRIM 800-160 MG PO TABS: 1.0000 | ORAL_TABLET | Freq: Two times a day (BID) | ORAL | 0 refills | Status: AC

## 2016-09-07 NOTE — ED Triage Notes (Signed)
Pt reports shortness of breath that started this morning. Pt reports she has been taking medications for bladder infection and last night began feeling weak and having chills This morning when she woke up pt states she was experiencing severe weakness, sharp pains in her chest for a brief period, and sob. Pt is tachypenic in triage. VSS

## 2016-09-07 NOTE — Discharge Instructions (Signed)
Bactrim as prescribed.  Follow-up with Dr. Mancel Bale later this week. She tells me her office will call you to make these arrangements.  Return to the emergency department if your symptoms significantly worsen or change.

## 2016-09-07 NOTE — ED Provider Notes (Signed)
[facility] DEPT Provider Note   CSN: [id number] Arrival date & time: 09/07/16  1116     History   Chief Complaint Chief Complaint  Patient presents with  . Respiratory Distress    HPI [name] is a 83 y.o. female.  Patient is an 83 year old female with history of anemia, diverticulitis, high cholesterol presenting for evaluation of chest congestion and cough that is worsened over the past 2 weeks. She was seen by her GYN and was treated with Macrobid for a UTI. This morning she felt poorly and called her GYN who told her to come here to be evaluated. Patient states the doctor was concerned about the possibility of infection in her blood. She reports low-grade fevers at home but denies any vomiting or diarrhea.   The history is provided by the patient.    Past Medical History:  Diagnosis Date  . Abnormal Pap smear 1975-76  . Anemia    history of  . Arthritis    spine  . Breast cyst, left 1980  . Cystocele 2012  . Diverticulitis   . Diverticulosis   . GERD (gastroesophageal reflux disease)   . H/O hypercholesterolemia   . H/O osteopenia   . H/O varicella   . H/O varicose veins   . Left leg swelling   . Macular degeneration   . Mitral valve prolapse   . Mumps   . Seasonal allergies   . Urge incontinence 2012  . Urinary frequency 2010    Patient Active Problem List   Diagnosis Date Noted  . Urinary, incontinence, stress female 06/16/2016  . Right carotid bruit 12/23/2014  . Exertional dyspnea 12/24/2013  . Bilateral arm numbness and tingling while sleeping - and shortly after waking 12/24/2013  . Poor balance 12/24/2013  . Edema of left lower extremity 12/21/2012  . Essential hypertension; borderline   . H/O hypercholesterolemia   . Osteoporosis - of the spine 09/22/2011    Past Surgical History:  Procedure Laterality Date  . ABDOMINAL HYSTERECTOMY    . BLADDER SUSPENSION N/A 06/16/2016   Procedure: TRANSVAGINAL TAPE (TVT) PROCEDURE;  Surgeon:  Everett Graff, MD;  Location: [facility];  Service: Gynecology;  Laterality: N/A;  . BREAST CYST ASPIRATION  1964  . COLONOSCOPY     Dr. Earlean Shawl  . CYSTOCELE REPAIR N/A 06/16/2016   Procedure: ANTERIOR REPAIR (CYSTOCELE);  Surgeon: Everett Graff, MD;  Location: [facility];  Service: Gynecology;  Laterality: N/A;  . CYSTOSCOPY N/A 06/16/2016   Procedure: CYSTOSCOPY;  Surgeon: Everett Graff, MD;  Location: [facility];  Service: Gynecology;  Laterality: N/A;  see anterior repair  . Lower Extremity Venous Dopplers  01/09/2012   Right and left lower steroids: No evidence of thrombus or, thrombophlebitis; right and left GSV and SSV: No venous insufficiency. Normal exam.  . NM MYOVIEW LTD  March 2009   Negative for ischemia or infarction  . TRANSTHORACIC ECHOCARDIOGRAM  March 2009   Normal EF, mild aortic sclerosis with no stenosis.    OB History    Gravida Para Term Preterm AB Living   3 3 3     3    SAB TAB Ectopic Multiple Live Births           3       Home Medications    Prior to Admission medications   Medication Sig Start Date End Date Taking? Authorizing Provider  acetaminophen (TYLENOL) 500 MG tablet Take 1,000 mg by mouth every 8 (eight) hours as needed for mild pain.  [provider]  chlorpheniramine (CHLOR-TRIMETON) 4 MG tablet Take 4 mg by mouth daily as needed for allergies.    [provider]  cholecalciferol (VITAMIN D) 1000 units tablet Take 1,000 Units by mouth daily.    [provider]  ciprofloxacin (CIPRO) 250 MG tablet Take 1 tablet (250 mg total) by mouth every 12 (twelve) hours. For 3 days 06/17/16   Everett Graff, MD  Cyanocobalamin (VITAMIN B-12) 3000 MCG SUBL Place 3,000 mcg under the tongue 3 (three) times a week.    [provider]  HYDROcodone-acetaminophen (NORCO/VICODIN) 5-325 MG tablet Take 1 tablet by mouth every 6 (six) hours as needed for moderate pain. 06/17/16   Everett Graff, MD  ibuprofen (ADVIL,MOTRIN) 600 MG tablet Take  1 tablet (600 mg total) by mouth every 8 (eight) hours as needed (mild pain). 06/17/16   Everett Graff, MD  Lactobacillus Rhamnosus, GG, (CULTURELLE) CAPS Take 1 tablet by mouth daily.    [provider]  Magnesium 250 MG TABS Take 250 mg by mouth daily as needed (leg cramps).    [provider]  Multiple Vitamin (MULTIVITAMIN WITH MINERALS) TABS Take 1 tablet by mouth daily.    [provider]  Multiple Vitamins-Minerals (HAIR VITAMINS) TABS Take 1 tablet by mouth 3 (three) times a week. Hair supplement called Hair Volume    [provider]  UNABLE TO FIND Take 1 tablet by mouth daily. Eye Supplement for Macular Degeneration  With lutein and bilberry antioxidants    [provider]    Family History No family history on file.  Social History Social History  Substance Use Topics  . Smoking status: Never Smoker  . Smokeless tobacco: Never Used  . Alcohol use No     Allergies   Ivp dye [iodinated diagnostic agents]   Review of Systems Review of Systems  All other systems reviewed and are negative.    Physical Exam Updated Vital Signs BP (!) 151/117   Pulse (!) 103   Temp 99.3 F (37.4 C) (Oral)   Resp (!) 26   Ht 5\' 4"  (1.626 m)   Wt 63.5 kg (140 lb)   SpO2 95%   BMI 24.03 kg/m   Physical Exam  Constitutional: She is oriented to person, place, and time. She appears well-developed and well-nourished. No distress.  She appears somewhat anxious  HENT:  Head: Normocephalic and atraumatic.  Mouth/Throat: Oropharynx is clear and moist.  Neck: Normal range of motion. Neck supple.  Cardiovascular: Normal rate and regular rhythm.  Exam reveals no gallop and no friction rub.   No murmur heard. Pulmonary/Chest: Effort normal and breath sounds normal. No respiratory distress. She has no wheezes. She has no rales.  Abdominal: Soft. Bowel sounds are normal. She exhibits no distension. There is no tenderness.  Musculoskeletal: Normal  range of motion. She exhibits no edema.  Neurological: She is alert and oriented to person, place, and time.  Skin: Skin is warm and dry. She is not diaphoretic.  Nursing note and vitals reviewed.    ED Treatments / Results  Labs (all labs ordered are listed, but only abnormal results are displayed) Labs Reviewed  BASIC METABOLIC PANEL - Abnormal; Notable for the following:       Result Value   Chloride 100 (*)    Glucose, Bld 199 (*)    Creatinine, Ser 1.15 (*)    GFR calc non Af Amer 42 (*)    GFR calc Af Amer 48 (*)  All other components within normal limits  CBC - Abnormal; Notable for the following:    HCT 35.0 (*)    All other components within normal limits  URINALYSIS, ROUTINE W REFLEX MICROSCOPIC - Abnormal; Notable for the following:    Glucose, UA 50 (*)    Protein, ur 30 (*)    Squamous Epithelial / LPF 0-5 (*)    All other components within normal limits  I-STAT TROPONIN, ED    EKG  EKG Interpretation  Date/Time:  Wednesday September 07 2016 11:20:51 EDT Ventricular Rate:  106 PR Interval:  140 QRS Duration: 104 QT Interval:  342 QTC Calculation: 539 R Axis:   39 Text Interpretation:  Sinus tachycardia Possible Left atrial enlargement Incomplete right bundle branch block Abnormal ECG Confirmed by Veryl Speak [id number]) on 09/07/2016 1:11:02 PM       Radiology Dg Chest 2 View  Result Date: 09/07/2016 CLINICAL DATA:  Shortness of breath. EXAM: CHEST  2 VIEW COMPARISON:  03/25/2015. FINDINGS: Mediastinum and hilar structures are normal. Heart size stable. Mild bibasilar subsegmental atelectasis. No pleural effusion or pneumothorax. IMPRESSION: Mild bibasilar subsegmental atelectasis. Electronically Signed   By: Marcello Moores  Register   On: 09/07/2016 12:17    Procedures Procedures (including critical care time)  Medications Ordered in ED Medications - No data to display   Initial Impression / Assessment and Plan / ED Course  I have reviewed the triage vital  signs and the nursing notes.  Pertinent labs & imaging results that were available during my care of the patient were reviewed by me and considered in my medical decision making (see chart for details).  Patient's workup is unremarkable. She is afebrile, there is no leukocytosis, chest x-ray is clear, and urinalysis is clear. She is diagnosed with salmonella in her urine last week at the doctor's office. I've spoken with Dr. Mancel Bale who is her GYN and have reviewed the culture report from her urine. This appears to be susceptible to Bactrim. She will be treated with this and is to follow-up with her primary doctor.  Final Clinical Impressions(s) / ED Diagnoses   Final diagnoses:  None    New Prescriptions New Prescriptions   No medications on file     Veryl Speak, MD 09/07/16 1411

## 2016-09-07 NOTE — ED Notes (Signed)
MD at bedside to discuss patient's chest pain.

## 2016-09-07 NOTE — ED Notes (Signed)
Patient transported to X-ray 

## 2016-11-17 ENCOUNTER — Ambulatory Visit (INDEPENDENT_AMBULATORY_CARE_PROVIDER_SITE_OTHER): Payer: Medicare Other | Admitting: Physician Assistant

## 2016-11-17 ENCOUNTER — Encounter: Payer: Self-pay | Admitting: Physician Assistant

## 2016-11-17 VITALS — BP 118/66 | HR 85 | Ht 64.0 in | Wt 141.0 lb

## 2016-11-17 DIAGNOSIS — R011 Cardiac murmur, unspecified: Secondary | ICD-10-CM

## 2016-11-17 DIAGNOSIS — R0789 Other chest pain: Secondary | ICD-10-CM

## 2016-11-17 DIAGNOSIS — E785 Hyperlipidemia, unspecified: Secondary | ICD-10-CM | POA: Diagnosis not present

## 2016-11-17 HISTORY — PX: NM MYOVIEW LTD: HXRAD82

## 2016-11-17 NOTE — Progress Notes (Signed)
Cardiology Office Note    Date:  11/19/2016   ID:  Thornton, Catherine [birth year], MRN [id number]  PCP:  Lavone Orn, MD  Cardiologist:  Dr. Ellyn Hack  Chief Complaint  Patient presents with  . Follow-up    seen for Dr. Ellyn Hack    History of Present Illness:  [name] is a 83 y.o. female with PMH of HLD, mitral valve prolapse and GERD. She is a former patient of Dr. Melvern Banker. Carotid Doppler obtaining December 2016 was normal. Patient recently presented to the ED on 09/05/2016 was chest congestion and cough. She was recently treated for UTI as well. EKG obtained on that day showed right bundle branch block.  Last week, patient had episode of chest pain while sitting in the car. It lasted summer between 30-60 minutes. She went in to obtaining a flu shot, however due to her symptom, pharmacy refused to give her the flu shot. She went to her PCPs office will obtain a stat troponin and basic metabolic panel. Renal function stable, so his electrolyte. Troponin was negative. She presents today for cardiology office evaluation. I recommended a stress test. Although she does have a heart murmur on physical exam, it is very mild. I will hold off on echocardiogram at this time. She notes, she is able to walk 2 miles on Tuesday and Wednesday without any significant issue. She does have new right bundle branch block since August of this year, however I do not think it necessarily represent ischemia.   Past Medical History:  Diagnosis Date  . Abnormal Pap smear 1975-76  . Anemia    history of  . Arthritis    spine  . Breast cyst, left 1980  . Cystocele 2012  . Diverticulitis   . Diverticulosis   . GERD (gastroesophageal reflux disease)   . H/O hypercholesterolemia   . H/O osteopenia   . H/O varicella   . H/O varicose veins   . Left leg swelling   . Macular degeneration   . Mitral valve prolapse   . Mumps   . Seasonal allergies   . Urge incontinence 2012  . Urinary frequency 2010     Past Surgical History:  Procedure Laterality Date  . ABDOMINAL HYSTERECTOMY    . BLADDER SUSPENSION N/A 06/16/2016   Procedure: TRANSVAGINAL TAPE (TVT) PROCEDURE;  Surgeon: Everett Graff, MD;  Location: [facility];  Service: Gynecology;  Laterality: N/A;  . BREAST CYST ASPIRATION  1964  . COLONOSCOPY     Dr. Earlean Shawl  . CYSTOCELE REPAIR N/A 06/16/2016   Procedure: ANTERIOR REPAIR (CYSTOCELE);  Surgeon: Everett Graff, MD;  Location: [facility];  Service: Gynecology;  Laterality: N/A;  . CYSTOSCOPY N/A 06/16/2016   Procedure: CYSTOSCOPY;  Surgeon: Everett Graff, MD;  Location: [facility];  Service: Gynecology;  Laterality: N/A;  see anterior repair  . Lower Extremity Venous Dopplers  01/09/2012   Right and left lower steroids: No evidence of thrombus or, thrombophlebitis; right and left GSV and SSV: No venous insufficiency. Normal exam.  . NM MYOVIEW LTD  March 2009   Negative for ischemia or infarction  . TRANSTHORACIC ECHOCARDIOGRAM  March 2009   Normal EF, mild aortic sclerosis with no stenosis.    Current Medications: Outpatient Medications Prior to Visit  Medication Sig Dispense Refill  . acetaminophen (TYLENOL) 500 MG tablet Take 1,000 mg by mouth every 8 (eight) hours as needed for mild pain.    . cholecalciferol (VITAMIN D) 1000 units tablet Take 1,000 Units by  mouth daily.    . Cyanocobalamin (VITAMIN B-12) 3000 MCG SUBL Place 3,000 mcg under the tongue 3 (three) times a week.    . Lactobacillus Rhamnosus, GG, (CULTURELLE) CAPS Take 1 tablet by mouth daily.    . Magnesium 250 MG TABS Take 250 mg by mouth daily as needed (leg cramps).    . Multiple Vitamin (MULTIVITAMIN WITH MINERALS) TABS Take 1 tablet by mouth daily.    . Multiple Vitamins-Minerals (HAIR VITAMINS) TABS Take 1 tablet by mouth 3 (three) times a week. Hair supplement called Hair Volume    . UNABLE TO FIND Take 1 tablet by mouth daily. Eye Supplement for Macular Degeneration  With lutein and bilberry antioxidants    .  chlorpheniramine (CHLOR-TRIMETON) 4 MG tablet Take 4 mg by mouth daily as needed for allergies.    . ciprofloxacin (CIPRO) 250 MG tablet Take 1 tablet (250 mg total) by mouth every 12 (twelve) hours. For 3 days 6 tablet 0  . HYDROcodone-acetaminophen (NORCO/VICODIN) 5-325 MG tablet Take 1 tablet by mouth every 6 (six) hours as needed for moderate pain. 20 tablet 0  . ibuprofen (ADVIL,MOTRIN) 600 MG tablet Take 1 tablet (600 mg total) by mouth every 8 (eight) hours as needed (mild pain). 30 tablet 0   No facility-administered medications prior to visit.      Allergies:   Ivp dye [iodinated diagnostic agents]   Social History   Social History  . Marital status: Married    Spouse name: N/A  . Number of children: N/A  . Years of education: N/A   Social History Main Topics  . Smoking status: Never Smoker  . Smokeless tobacco: Never Used  . Alcohol use No  . Drug use: No  . Sexual activity: Yes    Birth control/ protection: Surgical     Comment: Hysterectomy   Other Topics Concern  . None   Social History Narrative   She is a married mother of 66, grandmother of 73, great grandmother of 1. She walks   daily about 2 miles a day, does not smoke, does not drink.      Family History:  The patient's family history includes Heart attack (age of onset: 58) in her brother; Uterine cancer (age of onset: 81) in her mother.   ROS:   Please see the history of present illness.    ROS All other systems reviewed and are negative.   PHYSICAL EXAM:   VS:  BP 118/66 (BP Location: Left Arm, Patient Position: Sitting, Cuff Size: Normal)   Pulse 85   Ht 5\' 4"  (1.626 m)   Wt 141 lb (64 kg)   BMI 24.20 kg/m    GEN: Well nourished, well developed, in no acute distress  HEENT: normal  Neck: no JVD, carotid bruits, or masses Cardiac: RRR; no murmurs, rubs, or gallops,no edema  Respiratory:  clear to auscultation bilaterally, normal work of breathing GI: soft, nontender, nondistended, + BS MS: no  deformity or atrophy  Skin: warm and dry, no rash Neuro:  Alert and Oriented x 3, Strength and sensation are intact Psych: euthymic mood, full affect  Wt Readings from Last 3 Encounters:  11/17/16 141 lb (64 kg)  09/07/16 140 lb (63.5 kg)  06/16/16 138 lb 2 oz (62.7 kg)      Studies/Labs Reviewed:   EKG:  EKG is ordered today.  The ekg ordered today demonstrates Normal sinus rhythm with right bundle branch block.  Recent Labs: 06/06/2016: ALT 14 09/07/2016: BUN 14;  Creatinine, Ser 1.15; Hemoglobin 12.4; Platelets 193; Potassium 4.3; Sodium 136   Lipid Panel    Component Value Date/Time   CHOL 186 12/23/2014 0851   TRIG 66 12/23/2014 0851   HDL 64 12/23/2014 0851   CHOLHDL 2.9 12/23/2014 0851   VLDL 13 12/23/2014 0851   LDLCALC 109 12/23/2014 0851    Additional studies/ records that were reviewed today include:   Carotid 12/30/2014 Homogeneous plaque, bilaterally. 1-39% bilateral ICA stenosis. Normal subclavian arteries, bilaterally. Patent vertebral arteries with antegrade flow.    ASSESSMENT:    1. Atypical chest pain   2. Heart murmur   3. Hyperlipidemia, unspecified hyperlipidemia type      PLAN:  In order of problems listed above:  1. Atypical chest pain: Given her risk factors, will obtain treadmill Myoview.  2. Heart murmur: History of mitral valve prolapse, very mild 1/6 heart murmur. I don't think she necessarily need an echocardiogram at this point. We'll continue to observe.  3. Hyperlipidemia: Will defer to primary care service to obtain annual lab work.    Medication Adjustments/Labs and Tests Ordered: Current medicines are reviewed at length with the patient today.  Concerns regarding medicines are outlined above.  Medication changes, Labs and Tests ordered today are listed in the Patient Instructions below. Patient Instructions  Medication Instructions: Your physician recommends that you continue on your current medications as directed.  Please refer to the Current Medication list given to you today.   Testing/Procedures: Your physician has requested that you have an exercise stress myoview. For further information please visit [email or web address]. Please follow instruction sheet, as given.  Follow-Up: Your physician recommends that you schedule a follow-up appointment in: 4-6 weeks with Dr. Ellyn Hack.  If you need a refill on your cardiac medications before your next appointment, please call your pharmacy.     Hilbert Corrigan, [place]  11/19/2016 2:36 PM    [facility] [place], [place],   [address] Phone: [phone]; Fax: [phone]

## 2016-11-17 NOTE — Patient Instructions (Signed)
Medication Instructions: Your physician recommends that you continue on your current medications as directed. Please refer to the Current Medication list given to you today.   Testing/Procedures: Your physician has requested that you have an exercise stress myoview. For further information please visit [email or web address]. Please follow instruction sheet, as given.  Follow-Up: Your physician recommends that you schedule a follow-up appointment in: 4-6 weeks with Dr. Ellyn Hack.  If you need a refill on your cardiac medications before your next appointment, please call your pharmacy.

## 2016-11-19 ENCOUNTER — Encounter: Payer: Self-pay | Admitting: Physician Assistant

## 2016-11-24 ENCOUNTER — Telehealth (HOSPITAL_COMMUNITY): Payer: Self-pay

## 2016-11-24 NOTE — Telephone Encounter (Signed)
Encounter complete. 

## 2016-11-25 ENCOUNTER — Telehealth (HOSPITAL_COMMUNITY): Payer: Self-pay

## 2016-11-25 NOTE — Telephone Encounter (Signed)
Encounter complete. 

## 2016-11-29 ENCOUNTER — Ambulatory Visit (HOSPITAL_COMMUNITY)
Admission: RE | Admit: 2016-11-29 | Discharge: 2016-11-29 | Disposition: A | Discharge status: Home / Self Care | Payer: Medicare Other | Source: Ambulatory Visit | Attending: Cardiology | Admitting: Cardiology

## 2016-11-29 DIAGNOSIS — R0789 Other chest pain: Secondary | ICD-10-CM

## 2016-11-29 LAB — MYOCARDIAL PERFUSION IMAGING
Estimated workload: 6.6 METS
Exercise duration (min): 4 min
Exercise duration (sec): 40 s
LV dias vol: 58 mL (ref 46–106)
LV sys vol: 16 mL
MPHR: 133 {beats}/min
Peak HR: 106 {beats}/min
Percent HR: 106 %
RPE: 18
Rest HR: 62 {beats}/min
SDS: 2
SRS: 1
SSS: 3
TID: 1.19

## 2016-11-29 MED ORDER — REGADENOSON 0.4 MG/5ML IV SOLN: 0.4000 mg | Freq: Once | INTRAVENOUS | Status: DC

## 2016-11-29 MED ORDER — TECHNETIUM TC 99M TETROFOSMIN IV KIT
8.6000 | PACK | Freq: Once | INTRAVENOUS | Status: AC | PRN
  Administered 2016-11-29: 8.6 via INTRAVENOUS
  Filled 2016-11-29: qty 9

## 2016-11-29 MED ORDER — TECHNETIUM TC 99M TETROFOSMIN IV KIT
27.0000 | PACK | Freq: Once | INTRAVENOUS | Status: AC | PRN
  Administered 2016-11-29: 27 via INTRAVENOUS
  Filled 2016-11-29: qty 27

## 2016-11-30 ENCOUNTER — Encounter (INDEPENDENT_AMBULATORY_CARE_PROVIDER_SITE_OTHER): Payer: Medicare Other | Admitting: Ophthalmology

## 2016-11-30 DIAGNOSIS — H43813 Vitreous degeneration, bilateral: Secondary | ICD-10-CM

## 2016-11-30 DIAGNOSIS — H353122 Nonexudative age-related macular degeneration, left eye, intermediate dry stage: Secondary | ICD-10-CM

## 2016-11-30 DIAGNOSIS — H353211 Exudative age-related macular degeneration, right eye, with active choroidal neovascularization: Secondary | ICD-10-CM

## 2016-12-01 ENCOUNTER — Ambulatory Visit (INDEPENDENT_AMBULATORY_CARE_PROVIDER_SITE_OTHER): Payer: Medicare Other | Admitting: Ophthalmology

## 2016-12-26 ENCOUNTER — Ambulatory Visit: Payer: Medicare Other | Admitting: Cardiology

## 2017-02-28 ENCOUNTER — Encounter: Payer: Self-pay | Admitting: Cardiology

## 2017-02-28 ENCOUNTER — Ambulatory Visit: Payer: Medicare Other | Admitting: Cardiology

## 2017-02-28 VITALS — BP 126/68 | HR 87 | Ht 64.0 in | Wt 139.0 lb

## 2017-02-28 DIAGNOSIS — R0609 Other forms of dyspnea: Secondary | ICD-10-CM | POA: Diagnosis not present

## 2017-02-28 DIAGNOSIS — I10 Essential (primary) hypertension: Secondary | ICD-10-CM

## 2017-02-28 DIAGNOSIS — R600 Localized edema: Secondary | ICD-10-CM | POA: Diagnosis not present

## 2017-02-28 DIAGNOSIS — R0989 Other specified symptoms and signs involving the circulatory and respiratory systems: Secondary | ICD-10-CM | POA: Diagnosis not present

## 2017-02-28 DIAGNOSIS — R0600 Dyspnea, unspecified: Secondary | ICD-10-CM

## 2017-02-28 NOTE — Progress Notes (Signed)
PCP: Lavone Orn, MD  Clinic Note: Chief Complaint  Patient presents with  . Follow-up    12 months -no major complaints/symptoms     HPI: [name] is a 84 y.o. female with a PMH below who presents today for delayed annual follow-up.  She is being followed for reported mitral prolapse and hypertension. She is a former patient of Dr. Myrtice Lauth. Otherwise negative coronary history.  [name] was last seen in November by Almyra Deforest, PA-C for chest pain.  She was evaluated with a nuclear stress test  Recent Hospitalizations: None  Studies Reviewed:   Myoview December 02, 2016: 6.6 METS.  Reached 106% of max.  Heart rate.  Walk for 4:40 min.  EF 72%.  Normal blood pressure response.  upsloping ST segment depression, nonspecific.  Otherwise normal study.  LOW RISK.  No evidence of ischemia or infarction.  Interval History: [name] presents today as usual doing very well.  She still does routine exercise, but just does not like walking wants to start getting cold.  She enjoys walking outside, and does not do it when the cold weather comes on.  So she is not really do much walking since the fall, but looks forward to the spring and summer.  She only gets a little bit short of breath if she exerts herself significantly for instance climbing stairs carrying something in her hands. Besides some rare occasional flip-flops in her chest, she has not had any symptoms since her last mild lower extremity swelling.  She has been under little bit of stress lately because her daughter was recently diagnosed with breast cancer and has had a mastectomy.  This is been hard for [name] to deal with - having to help out as caregiver.   She is had intermittent discomfort in her right calf, and still has a little bit of lower extremity swelling -more in the left than right.  She denies any chest tightness or pressure with rest or exertion.  No resting exertional dyspnea.  No rapid irregular  heartbeats to suspect arrhythmia.  No PND, orthopnea or edema.  No syncope/near syncope or TIA/amaurosis fugax. No claudication.  No bleeding issues.   ROS: A comprehensive was performed. Review of Systems  Constitutional: Positive for weight loss (Subtle, she isn't eating much). Negative for malaise/fatigue.  HENT: Negative for congestion.   Respiratory: Negative for cough, shortness of breath and wheezing.   Gastrointestinal: Positive for heartburn (occasional indigestion). Negative for abdominal pain and constipation.  Genitourinary: Negative for dysuria, frequency and urgency.  Musculoskeletal:       Nighttime cramping  Neurological: Positive for dizziness (With bending over).  Psychiatric/Behavioral: Negative for depression and memory loss. The patient is not nervous/anxious and does not have insomnia.   All other systems reviewed and are negative.   Past Medical History:  Diagnosis Date  . Abnormal Pap smear 1975-76  . Anemia    history of  . Arthritis    spine  . Breast cyst, left 1980  . Cystocele 2012  . Diverticulitis   . Diverticulosis   . GERD (gastroesophageal reflux disease)   . H/O hypercholesterolemia   . H/O osteopenia   . H/O varicella   . H/O varicose veins   . Left leg swelling   . Macular degeneration   . Mitral valve prolapse   . Mumps   . Seasonal allergies   . Urge incontinence 2012  . Urinary frequency 2010    Past Surgical  History:  Procedure Laterality Date  . ABDOMINAL HYSTERECTOMY    . BLADDER SUSPENSION N/A 06/16/2016   Procedure: TRANSVAGINAL TAPE (TVT) PROCEDURE;  Surgeon: Everett Graff, MD;  Location: [facility];  Service: Gynecology;  Laterality: N/A;  . BREAST CYST ASPIRATION  1964  . COLONOSCOPY     Dr. Earlean Shawl  . CYSTOCELE REPAIR N/A 06/16/2016   Procedure: ANTERIOR REPAIR (CYSTOCELE);  Surgeon: Everett Graff, MD;  Location: [facility];  Service: Gynecology;  Laterality: N/A;  . CYSTOSCOPY N/A 06/16/2016   Procedure: CYSTOSCOPY;   Surgeon: Everett Graff, MD;  Location: [facility];  Service: Gynecology;  Laterality: N/A;  see anterior repair  . Lower Extremity Venous Dopplers  01/09/2012   Right and left lower steroids: No evidence of thrombus or, thrombophlebitis; right and left GSV and SSV: No venous insufficiency. Normal exam.  . NM MYOVIEW LTD  March 2009   Negative for ischemia or infarction  . TRANSTHORACIC ECHOCARDIOGRAM  March 2009   Normal EF, mild aortic sclerosis with no stenosis.    Current Meds  Medication Sig  . acetaminophen (TYLENOL) 500 MG tablet Take 1,000 mg by mouth every 8 (eight) hours as needed for mild pain.  . cholecalciferol (VITAMIN D) 1000 units tablet Take 1,000 Units by mouth daily.  . Cyanocobalamin (VITAMIN B-12) 3000 MCG SUBL Place 3,000 mcg under the tongue 3 (three) times a week.  . Lactobacillus Rhamnosus, GG, (CULTURELLE) CAPS Take 1 tablet by mouth daily.  . Magnesium 250 MG TABS Take 250 mg by mouth daily as needed (leg cramps).  . Multiple Vitamin (MULTIVITAMIN WITH MINERALS) TABS Take 1 tablet by mouth daily.  . Multiple Vitamins-Minerals (HAIR VITAMINS) TABS Take 1 tablet by mouth 3 (three) times a week. Hair supplement called Hair Volume  . UNABLE TO FIND Take 1 tablet by mouth daily. Eye Supplement for Macular Degeneration  With lutein and bilberry antioxidants    Allergies  Allergen Reactions  . Ivp Dye [Iodinated Diagnostic Agents] Hives    Social History   Socioeconomic History  . Marital status: Married    Spouse name: None  . Number of children: None  . Years of education: None  . Highest education level: None  Social Needs  . Financial resource strain: None  . Food insecurity - worry: None  . Food insecurity - inability: None  . Transportation needs - medical: None  . Transportation needs - non-medical: None  Occupational History  . None  Tobacco Use  . Smoking status: Never Smoker  . Smokeless tobacco: Never Used  Substance and Sexual Activity  .  Alcohol use: No  . Drug use: No  . Sexual activity: Yes    Birth control/protection: Surgical    Comment: Hysterectomy  Other Topics Concern  . None  Social History Narrative   She is a married mother of 25, grandmother of 53, great grandmother of 1. She walks   daily about 2 miles a day, does not smoke, does not drink.     family history includes Heart attack (age of onset: 88) in her brother; Uterine cancer (age of onset: 23) in her mother.  Wt Readings from Last 3 Encounters:  02/28/17 139 lb (63 kg)  11/29/16 141 lb (64 kg)  11/17/16 141 lb (64 kg)    PHYSICAL EXAM BP 126/68   Pulse 87   Ht 5\' 4"  (1.626 m)   Wt 139 lb (63 kg)   BMI 23.86 kg/m   Physical Exam  Constitutional: She is oriented to  person, place, and time. She appears well-developed and well-nourished. No distress.  HENT:  Head: Normocephalic and atraumatic.  Left Ear: External ear normal.  Neck: Neck supple. No hepatojugular reflux and no JVD present. Carotid bruit is present (Possible soft right carotid bruit).  Cardiovascular: Normal rate, regular rhythm, S1 normal, S2 normal, intact distal pulses and normal pulses.  No extrasystoles are present. PMI is not displaced. Exam reveals no gallop and no friction rub.  Murmur heard.  Medium-pitched harsh early systolic murmur is present with a grade of 1/6 at the upper right sternal border radiating to the neck. Pulmonary/Chest: Effort normal and breath sounds normal. No respiratory distress. She has no wheezes.  Abdominal: Soft. Bowel sounds are normal. She exhibits no distension. There is no tenderness.  Musculoskeletal: Normal range of motion. She exhibits no edema.  Neurological: She is alert and oriented to person, place, and time.  Psychiatric: She has a normal mood and affect. Her behavior is normal. Judgment and thought content normal.  Nursing note and vitals reviewed.    Adult ECG Report Not checked  Other studies Reviewed: Additional studies/  records that were reviewed today include:  Recent Labs: No recent labs available Lab Results  Component Value Date   CREATININE 1.15 (H) 09/07/2016   BUN 14 09/07/2016   NA 136 09/07/2016   K 4.3 09/07/2016   CL 100 (L) 09/07/2016   CO2 24 09/07/2016      ASSESSMENT / PLAN: Problem List Items Addressed This Visit    Edema of left lower extremity (Chronic)    She really has bilateral edema.  We talked about using support stockings and feet elevation. She does note that her left leg has more swelling than the right.      Essential hypertension; borderline - Primary (Chronic)    Stable blood pressure.  Remains on no medications.      Exertional dyspnea    Not currently a major issue for her.  She is still staying active and no heart failure or anginal type symptoms.      Right carotid bruit    I think we get hold off on carotid Dopplers this year and will see her back next year.      Relevant Orders   VAS US CAROTID     Current medicines are reviewed at length with the patient today. (+/- concerns) none The following changes have been made: None  Patient Instructions  No change with treatment at present.   Schedule in feb 2020 at [address] Your physician has requested that you have a carotid duplex. This test is an ultrasound of the carotid arteries in your neck. It looks at blood flow through these arteries that supply the brain with blood. Allow one hour for this exam. There are no restrictions or special instructions.   Your physician wants you to follow-up in 12 months with DR Loraine Bhullar.You will receive a reminder letter in the mail two months in advance. If you don't receive a letter, please call our office to schedule the follow-up appointment.   Studies Ordered:   No orders of the defined types were placed in this encounter.     Glenetta Hew, M.D., M.S. Interventional Cardiologist   Pager # [phone] Phone # [phone] [address] [place], [place] [address]

## 2017-02-28 NOTE — Patient Instructions (Signed)
No change with treatment at present.   Schedule in feb 2020 at [address] Your physician has requested that you have a carotid duplex. This test is an ultrasound of the carotid arteries in your neck. It looks at blood flow through these arteries that supply the brain with blood. Allow one hour for this exam. There are no restrictions or special instructions.   Your physician wants you to follow-up in 12 months with DR HARDING.You will receive a reminder letter in the mail two months in advance. If you don't receive a letter, please call our office to schedule the follow-up appointment.

## 2017-03-02 ENCOUNTER — Ambulatory Visit: Payer: Medicare Other | Admitting: Cardiology

## 2017-03-02 ENCOUNTER — Encounter: Payer: Self-pay | Admitting: Cardiology

## 2017-03-02 NOTE — Assessment & Plan Note (Signed)
She really has bilateral edema.  We talked about using support stockings and feet elevation. She does note that her left leg has more swelling than the right.

## 2017-03-02 NOTE — Assessment & Plan Note (Signed)
Stable blood pressure.  Remains on no medications.

## 2017-03-02 NOTE — Assessment & Plan Note (Signed)
Not currently a major issue for her.  She is still staying active and no heart failure or anginal type symptoms.

## 2017-03-02 NOTE — Assessment & Plan Note (Signed)
I think we get hold off on carotid Dopplers this year and will see her back next year.

## 2017-05-24 ENCOUNTER — Encounter (INDEPENDENT_AMBULATORY_CARE_PROVIDER_SITE_OTHER): Payer: Medicare Other | Admitting: Ophthalmology

## 2017-05-24 DIAGNOSIS — H43813 Vitreous degeneration, bilateral: Secondary | ICD-10-CM

## 2017-05-24 DIAGNOSIS — H353122 Nonexudative age-related macular degeneration, left eye, intermediate dry stage: Secondary | ICD-10-CM | POA: Diagnosis not present

## 2017-05-24 DIAGNOSIS — H353211 Exudative age-related macular degeneration, right eye, with active choroidal neovascularization: Secondary | ICD-10-CM | POA: Diagnosis not present

## 2017-05-24 DIAGNOSIS — H26491 Other secondary cataract, right eye: Secondary | ICD-10-CM | POA: Diagnosis not present

## 2017-06-16 ENCOUNTER — Ambulatory Visit (HOSPITAL_COMMUNITY)
Admission: EM | Admit: 2017-06-16 | Discharge: 2017-06-16 | Disposition: A | Discharge status: Home / Self Care | Payer: Medicare Other | Attending: Family Medicine | Admitting: Family Medicine

## 2017-06-16 ENCOUNTER — Ambulatory Visit (INDEPENDENT_AMBULATORY_CARE_PROVIDER_SITE_OTHER): Payer: Medicare Other

## 2017-06-16 ENCOUNTER — Encounter (HOSPITAL_COMMUNITY): Payer: Self-pay

## 2017-06-16 DIAGNOSIS — B9789 Other viral agents as the cause of diseases classified elsewhere: Secondary | ICD-10-CM

## 2017-06-16 DIAGNOSIS — J069 Acute upper respiratory infection, unspecified: Secondary | ICD-10-CM

## 2017-06-16 MED ORDER — CETIRIZINE HCL 5 MG PO TABS: 5.0000 mg | ORAL_TABLET | Freq: Every day | ORAL | 0 refills | Status: DC

## 2017-06-16 MED ORDER — BENZONATATE 100 MG PO CAPS: 100.0000 mg | ORAL_CAPSULE | Freq: Every evening | ORAL | 0 refills | Status: DC | PRN

## 2017-06-16 NOTE — Discharge Instructions (Signed)
Chest x-ray did not show a pneumonia or bronchitis Get plenty of rest and push fluids Use OTC medication as needed for symptomatic relief Zyrtec prescribed Tessalon perles prescribed.  Take at bedtime as needed for cough.   Follow up with PCP if symptoms persists Return or go to ER if you have any new or worsening symptoms

## 2017-06-16 NOTE — ED Provider Notes (Signed)
[name]   [id number] 06/16/17 Arrival Time: 1119  SUBJECTIVE:  [name] is a 84 y.o. female who presents with headache, sore throat, and cough for 3 days.  Denies positive sick exposure or precipitating event.  Describes cough as constant and productive with yellow sputum.  Has tried cough syrup without relief.  Denies aggravating factos.  Denies previous symptoms in the past.   Complains of fatigue, SOB, and wheezing,   Denies fever, chills, sinus pain, rhinorrhea, chest pain, nausea, changes in bowel or bladder habits.    ROS: As per HPI.  Past Medical History:  Diagnosis Date  . Abnormal Pap smear 1975-76  . Anemia    history of  . Arthritis    spine  . Breast cyst, left 1980  . Cystocele 2012  . Diverticulitis   . Diverticulosis   . GERD (gastroesophageal reflux disease)   . H/O hypercholesterolemia   . H/O osteopenia   . H/O varicella   . H/O varicose veins   . Left leg swelling   . Macular degeneration   . Mitral valve prolapse   . Mumps   . Seasonal allergies   . Urge incontinence 2012  . Urinary frequency 2010   Past Surgical History:  Procedure Laterality Date  . ABDOMINAL HYSTERECTOMY    . BLADDER SUSPENSION N/A 06/16/2016   Procedure: TRANSVAGINAL TAPE (TVT) PROCEDURE;  Surgeon: Everett Graff, MD;  Location: [facility];  Service: Gynecology;  Laterality: N/A;  . BREAST CYST ASPIRATION  1964  . COLONOSCOPY     Dr. Earlean Shawl  . CYSTOCELE REPAIR N/A 06/16/2016   Procedure: ANTERIOR REPAIR (CYSTOCELE);  Surgeon: Everett Graff, MD;  Location: [facility];  Service: Gynecology;  Laterality: N/A;  . CYSTOSCOPY N/A 06/16/2016   Procedure: CYSTOSCOPY;  Surgeon: Everett Graff, MD;  Location: [facility];  Service: Gynecology;  Laterality: N/A;  see anterior repair  . Lower Extremity Venous Dopplers  01/09/2012   Right and left lower steroids: No evidence of thrombus or, thrombophlebitis; right and left GSV and SSV: No venous insufficiency. Normal exam.  . NM  MYOVIEW LTD  March 2009   Negative for ischemia or infarction  . TRANSTHORACIC ECHOCARDIOGRAM  March 2009   Normal EF, mild aortic sclerosis with no stenosis.   Allergies  Allergen Reactions  . Ivp Dye [Iodinated Diagnostic Agents] Hives   No current facility-administered medications on file prior to encounter.    Current Outpatient Medications on File Prior to Encounter  Medication Sig Dispense Refill  . acetaminophen (TYLENOL) 500 MG tablet Take 1,000 mg by mouth every 8 (eight) hours as needed for mild pain.    . cholecalciferol (VITAMIN D) 1000 units tablet Take 1,000 Units by mouth daily.    . Cyanocobalamin (VITAMIN B-12) 3000 MCG SUBL Place 3,000 mcg under the tongue 3 (three) times a week.    . Lactobacillus Rhamnosus, GG, (CULTURELLE) CAPS Take 1 tablet by mouth daily.    . Magnesium 250 MG TABS Take 250 mg by mouth daily as needed (leg cramps).    . Multiple Vitamin (MULTIVITAMIN WITH MINERALS) TABS Take 1 tablet by mouth daily.    . Multiple Vitamins-Minerals (HAIR VITAMINS) TABS Take 1 tablet by mouth 3 (three) times a week. Hair supplement called Hair Volume    . UNABLE TO FIND Take 1 tablet by mouth daily. Eye Supplement for Macular Degeneration  With lutein and bilberry antioxidants       Social History   Socioeconomic History  . Marital  status: Married    Spouse name: Not on file  . Number of children: Not on file  . Years of education: Not on file  . Highest education level: Not on file  Occupational History  . Not on file  Social Needs  . Financial resource strain: Not on file  . Food insecurity:    Worry: Not on file    Inability: Not on file  . Transportation needs:    Medical: Not on file    Non-medical: Not on file  Tobacco Use  . Smoking status: Never Smoker  . Smokeless tobacco: Never Used  Substance and Sexual Activity  . Alcohol use: No  . Drug use: No  . Sexual activity: Yes    Birth control/protection: Surgical    Comment: Hysterectomy    Lifestyle  . Physical activity:    Days per week: Not on file    Minutes per session: Not on file  . Stress: Not on file  Relationships  . Social connections:    Talks on phone: Not on file    Gets together: Not on file    Attends religious service: Not on file    Active member of club or organization: Not on file    Attends meetings of clubs or organizations: Not on file    Relationship status: Not on file  . Intimate partner violence:    Fear of current or ex partner: Not on file    Emotionally abused: Not on file    Physically abused: Not on file    Forced sexual activity: Not on file  Other Topics Concern  . Not on file  Social History Narrative   She is a married mother of 69, grandmother of 4, great grandmother of 23. She walks   daily about 2 miles a day, does not smoke, does not drink.    Family History  Problem Relation Age of Onset  . Uterine cancer Mother 31  . Heart attack Brother 64     OBJECTIVE:  Vitals:   06/16/17 1207 06/16/17 1208  BP:  (!) 152/68  Pulse: 86   Resp: 18   Temp: 98 F (36.7 C)   SpO2: 95%      General appearance: AOx3 in no acute distress; appears fatigued HEENT: PERRL.  EOM grossly intact.  No clear rhinorrhea; tonsils nonerythematous, uvula midline Neck: supple without LAD Lungs: clear to auscultation bilaterally without adventitious breath sounds Heart: regular rate and rhythm.  Radial pulses 2+ symmetrical bilaterally Skin: warm and dry Psychological: alert and cooperative; normal mood and affect   DIAGNOSTIC STUDIES:   CLINICAL DATA: Cough and shortness of breath with chest pain, 4 days duration.  EXAM: CHEST - 2 VIEW  COMPARISON: 09/07/2016  FINDINGS: Heart size is normal. There is chronic tortuosity of the aorta. There is mild scarring at the lung apices and lung bases, similar to the previous exams. No sign of infiltrate, mass, effusion or collapse. No significant bone finding.  IMPRESSION: No active  cardiopulmonary disease. Chronic aortic atherosclerosis. Mild scarring at the lung apices and lung bases.   Electronically Signed By: Nelson Chimes M.D. On: 06/16/2017 13:23  I have reviewed the x-rays and the radiologist interpretation. I am in agreement with the radiologist interpretation.    ASSESSMENT & PLAN:  1. Viral URI with cough     Meds ordered this encounter  Medications  . cetirizine (ZYRTEC) 5 MG tablet    Sig: Take 1 tablet (5 mg total) by mouth daily.  Dispense:  20 tablet    Refill:  0    Order Specific Question:   Supervising Provider    Answer:   Wynona Luna [phone]  . benzonatate (TESSALON) 100 MG capsule    Sig: Take 1 capsule (100 mg total) by mouth at bedtime as needed for cough.    Dispense:  12 capsule    Refill:  0    Order Specific Question:   Supervising Provider    Answer:   Wynona Luna [[id number]]    Chest x-ray did not show a pneumonia or bronchitis Get plenty of rest and push fluids Use OTC medication as needed for symptomatic relief Zyrtec prescribed Tessalon perles prescribed.  Take at bedtime as needed for cough.   Follow up with PCP if symptoms persists Return or go to ER if you have any new or worsening symptoms    Reviewed expectations re: course of current medical issues. Questions answered. Outlined signs and symptoms indicating need for more acute intervention. Patient verbalized understanding. After Visit Summary given.          Lestine Box, PA-C 06/16/17 1357

## 2017-06-16 NOTE — ED Triage Notes (Signed)
Pt presents with complaints of productive cough since Tuesday.  Now has pain with cough

## 2017-06-21 ENCOUNTER — Encounter (INDEPENDENT_AMBULATORY_CARE_PROVIDER_SITE_OTHER): Payer: Medicare Other | Admitting: Ophthalmology

## 2017-06-26 ENCOUNTER — Encounter (INDEPENDENT_AMBULATORY_CARE_PROVIDER_SITE_OTHER): Payer: Medicare Other | Admitting: Ophthalmology

## 2017-06-26 DIAGNOSIS — H2701 Aphakia, right eye: Secondary | ICD-10-CM

## 2017-12-25 ENCOUNTER — Encounter (INDEPENDENT_AMBULATORY_CARE_PROVIDER_SITE_OTHER): Payer: Medicare Other | Admitting: Ophthalmology

## 2017-12-25 DIAGNOSIS — H43813 Vitreous degeneration, bilateral: Secondary | ICD-10-CM | POA: Diagnosis not present

## 2017-12-25 DIAGNOSIS — H353122 Nonexudative age-related macular degeneration, left eye, intermediate dry stage: Secondary | ICD-10-CM

## 2017-12-25 DIAGNOSIS — D3132 Benign neoplasm of left choroid: Secondary | ICD-10-CM | POA: Diagnosis not present

## 2017-12-25 DIAGNOSIS — H353211 Exudative age-related macular degeneration, right eye, with active choroidal neovascularization: Secondary | ICD-10-CM

## 2018-01-17 HISTORY — PX: TRANSTHORACIC ECHOCARDIOGRAM: SHX275

## 2018-01-30 ENCOUNTER — Other Ambulatory Visit: Payer: Self-pay | Admitting: Obstetrics and Gynecology

## 2018-01-30 DIAGNOSIS — M8580 Other specified disorders of bone density and structure, unspecified site: Secondary | ICD-10-CM

## 2018-02-15 ENCOUNTER — Emergency Department (HOSPITAL_COMMUNITY): Payer: Medicare Other

## 2018-02-15 ENCOUNTER — Observation Stay (HOSPITAL_COMMUNITY)
Admission: EM | Admit: 2018-02-15 | Discharge: 2018-02-16 | Disposition: A | Discharge status: Home / Self Care | Payer: Medicare Other | Attending: Family Medicine | Admitting: Family Medicine

## 2018-02-15 ENCOUNTER — Other Ambulatory Visit: Payer: Self-pay

## 2018-02-15 ENCOUNTER — Encounter (HOSPITAL_COMMUNITY): Payer: Self-pay | Admitting: Emergency Medicine

## 2018-02-15 DIAGNOSIS — G459 Transient cerebral ischemic attack, unspecified: Secondary | ICD-10-CM | POA: Diagnosis not present

## 2018-02-15 DIAGNOSIS — I63319 Cerebral infarction due to thrombosis of unspecified middle cerebral artery: Secondary | ICD-10-CM | POA: Diagnosis not present

## 2018-02-15 DIAGNOSIS — R42 Dizziness and giddiness: Secondary | ICD-10-CM | POA: Diagnosis present

## 2018-02-15 DIAGNOSIS — Z79899 Other long term (current) drug therapy: Secondary | ICD-10-CM | POA: Insufficient documentation

## 2018-02-15 DIAGNOSIS — R52 Pain, unspecified: Secondary | ICD-10-CM

## 2018-02-15 DIAGNOSIS — I10 Essential (primary) hypertension: Secondary | ICD-10-CM | POA: Diagnosis not present

## 2018-02-15 DIAGNOSIS — R4701 Aphasia: Secondary | ICD-10-CM

## 2018-02-15 DIAGNOSIS — I639 Cerebral infarction, unspecified: Secondary | ICD-10-CM

## 2018-02-15 LAB — COMPREHENSIVE METABOLIC PANEL
ALT: 16 U/L (ref 0–44)
AST: 21 U/L (ref 15–41)
Albumin: 4.2 g/dL (ref 3.5–5.0)
Alkaline Phosphatase: 54 U/L (ref 38–126)
Anion gap: 10 (ref 5–15)
BUN: 13 mg/dL (ref 8–23)
CO2: 22 mmol/L (ref 22–32)
Calcium: 9.5 mg/dL (ref 8.9–10.3)
Chloride: 95 mmol/L — ABNORMAL LOW (ref 98–111)
Creatinine, Ser: 1.06 mg/dL — ABNORMAL HIGH (ref 0.44–1.00)
GFR calc Af Amer: 54 mL/min — ABNORMAL LOW (ref >60)
GFR calc non Af Amer: 47 mL/min — ABNORMAL LOW (ref >60)
Glucose, Bld: 98 mg/dL (ref 70–99)
Potassium: 4.1 mmol/L (ref 3.5–5.1)
Sodium: 127 mmol/L — ABNORMAL LOW (ref 135–145)
Total Bilirubin: 0.9 mg/dL (ref 0.3–1.2)
Total Protein: 7.7 g/dL (ref 6.5–8.1)

## 2018-02-15 LAB — PROTIME-INR
INR: 1.06
Prothrombin Time: 13.7 seconds (ref 11.4–15.2)

## 2018-02-15 LAB — CBC
HCT: 35.7 % — ABNORMAL LOW (ref 36.0–46.0)
Hemoglobin: 13 g/dL (ref 12.0–15.0)
MCH: 30 pg (ref 26.0–34.0)
MCHC: 36.4 g/dL — ABNORMAL HIGH (ref 30.0–36.0)
MCV: 82.3 fL (ref 80.0–100.0)
Platelets: 218 10*3/uL (ref 150–400)
RBC: 4.34 MIL/uL (ref 3.87–5.11)
RDW: 12.7 % (ref 11.5–15.5)
WBC: 6.4 10*3/uL (ref 4.0–10.5)
nRBC: 0 % (ref 0.0–0.2)

## 2018-02-15 LAB — DIFFERENTIAL
Abs Immature Granulocytes: 0.01 10*3/uL (ref 0.00–0.07)
Basophils Absolute: 0 10*3/uL (ref 0.0–0.1)
Basophils Relative: 1 %
Eosinophils Absolute: 0.1 10*3/uL (ref 0.0–0.5)
Eosinophils Relative: 1 %
Immature Granulocytes: 0 %
Lymphocytes Relative: 43 %
Lymphs Abs: 2.8 10*3/uL (ref 0.7–4.0)
Monocytes Absolute: 0.5 10*3/uL (ref 0.1–1.0)
Monocytes Relative: 8 %
Neutro Abs: 3.1 10*3/uL (ref 1.7–7.7)
Neutrophils Relative %: 47 %

## 2018-02-15 LAB — I-STAT TROPONIN, ED: Troponin i, poc: 0 ng/mL (ref 0.00–0.08)

## 2018-02-15 LAB — APTT: aPTT: 29 seconds (ref 24–36)

## 2018-02-15 LAB — CBG MONITORING, ED: Glucose-Capillary: 90 mg/dL (ref 70–99)

## 2018-02-15 LAB — I-STAT CREATININE, ED: Creatinine, Ser: 1.1 mg/dL — ABNORMAL HIGH (ref 0.44–1.00)

## 2018-02-15 MED ORDER — ESTROGENS, CONJUGATED 0.625 MG/GM VA CREA
0.5000 g | TOPICAL_CREAM | VAGINAL | Status: DC
  Filled 2018-02-15: qty 30

## 2018-02-15 MED ORDER — SODIUM CHLORIDE 0.9% FLUSH
3.0000 mL | Freq: Once | INTRAVENOUS | Status: AC
Start: 2018-02-15 — End: 2018-02-15
  Administered 2018-02-15: 3 mL via INTRAVENOUS

## 2018-02-15 MED ORDER — VITAMIN B-12 1000 MCG PO TABS
3000.0000 ug | ORAL_TABLET | ORAL | Status: DC
  Administered 2018-02-16: 3000 ug via ORAL
  Filled 2018-02-15: qty 3

## 2018-02-15 MED ORDER — TRAMADOL HCL 50 MG PO TABS
50.0000 mg | ORAL_TABLET | Freq: Once | ORAL | Status: AC
  Administered 2018-02-15: 50 mg via ORAL
  Filled 2018-02-15: qty 1

## 2018-02-15 MED ORDER — BACID PO TABS
1.0000 | ORAL_TABLET | Freq: Every day | ORAL | Status: DC
  Administered 2018-02-16: 1 via ORAL
  Filled 2018-02-15 (×3): qty 1

## 2018-02-15 MED ORDER — ACETAMINOPHEN 650 MG RE SUPP: 650.0000 mg | RECTAL | Status: DC | PRN

## 2018-02-15 MED ORDER — MIRABEGRON ER 25 MG PO TB24
25.0000 mg | ORAL_TABLET | Freq: Every day | ORAL | Status: DC
  Administered 2018-02-15 – 2018-02-16 (×2): 25 mg via ORAL
  Filled 2018-02-15 (×2): qty 1

## 2018-02-15 MED ORDER — SENNOSIDES-DOCUSATE SODIUM 8.6-50 MG PO TABS: 1.0000 | ORAL_TABLET | Freq: Every evening | ORAL | Status: DC | PRN

## 2018-02-15 MED ORDER — ASPIRIN 325 MG PO TABS
325.0000 mg | ORAL_TABLET | Freq: Every day | ORAL | Status: DC
  Administered 2018-02-15 – 2018-02-16 (×2): 325 mg via ORAL
  Filled 2018-02-15 (×2): qty 1

## 2018-02-15 MED ORDER — CULTURELLE PO CAPS: 1.0000 | ORAL_CAPSULE | Freq: Every day | ORAL | Status: DC

## 2018-02-15 MED ORDER — ACETAMINOPHEN 325 MG PO TABS
650.0000 mg | ORAL_TABLET | ORAL | Status: DC | PRN
  Administered 2018-02-15 – 2018-02-16 (×2): 650 mg via ORAL
  Filled 2018-02-15 (×2): qty 2

## 2018-02-15 MED ORDER — VITAMIN B-12 3000 MCG SL SUBL: 3000.0000 ug | SUBLINGUAL_TABLET | SUBLINGUAL | Status: DC

## 2018-02-15 MED ORDER — ASPIRIN 300 MG RE SUPP: 300.0000 mg | Freq: Every day | RECTAL | Status: DC

## 2018-02-15 MED ORDER — DESMOPRESSIN ACETATE 0.2 MG PO TABS: 0.2000 mg | ORAL_TABLET | Freq: Every day | ORAL | Status: DC

## 2018-02-15 MED ORDER — SODIUM CHLORIDE 0.9 % IV SOLN
INTRAVENOUS | Status: DC
  Administered 2018-02-15: 21:00:00 via INTRAVENOUS

## 2018-02-15 MED ORDER — STROKE: EARLY STAGES OF RECOVERY BOOK
Freq: Once | Status: AC
  Administered 2018-02-15: 20:00:00
  Filled 2018-02-15: qty 1

## 2018-02-15 MED ORDER — ATORVASTATIN CALCIUM 40 MG PO TABS: 40.0000 mg | ORAL_TABLET | Freq: Every day | ORAL | Status: DC

## 2018-02-15 MED ORDER — ACETAMINOPHEN 160 MG/5ML PO SOLN: 650.0000 mg | ORAL | Status: DC | PRN

## 2018-02-15 MED ORDER — ENOXAPARIN SODIUM 40 MG/0.4ML ~~LOC~~ SOLN
40.0000 mg | SUBCUTANEOUS | Status: DC
  Administered 2018-02-15: 40 mg via SUBCUTANEOUS
  Filled 2018-02-15: qty 0.4

## 2018-02-15 NOTE — Code Documentation (Addendum)
85 yo female coming from home where she lives with her husband. Pt reports waking up this morning and "not feeling well." Pt continued with daily activities. In the afternoon, she went to the grocery store and noted to have trouble getting her groceries. Her last known well was at 1500. Her son called and she was unable to articulate her words and had trouble speaking with him. Dizziness noted during this episode. Stopped shopping and drove home. Family brought her to the ED. Triage activated a Code Stroke due to 10 minutes of aphasia and some trouble word finding. Stroke Team met patient in CT. Initial NIHSS 1 due to mild left arm sensory decreased. No other focal deficits noted. CT Head negative for hemorrhage. Too Mild to Treat at this time. No LVO symptoms. Patient to continue with workup. Handoff given to [place], [place].

## 2018-02-15 NOTE — ED Provider Notes (Signed)
[facility] EMERGENCY DEPARTMENT Provider Note   CSN: [id number] Arrival date & time: 02/15/18  1604   An emergency department physician performed an initial assessment on this suspected stroke patient at 1610.  History   Chief Complaint Chief Complaint  Patient presents with  . Code Stroke    HPI [name] is a 85 y.o. female.  HPI 85 year old female with history of mitral valve prolapse, diverticulosis comes in with chief complaint of dizziness and difficulty speaking.  Patient reports that about 30 minutes prior to ER arrival she was at a grocery store.  While at the store she tried to speak with her son over the phone.  Patient was unable to get words out, and when she did get words out she was hard to comprehend.  She also was having dizziness that was constant.  Patient went home and called 911.  She describes the dizziness as lightheadedness.  There is no associated focal numbness, weakness, vision changes.  Patient speech has become better but she still feels like it is difficult for her to be fluent.  Patient is noted to be breathing heavily.  She denies any chest pain, shortness of breath, anxiety history.  She states that she woke up this morning just not feeling right, but she is unable to describe what she means by that.  There has been no recent infections.  Past Medical History:  Diagnosis Date  . Abnormal Pap smear 1975-76  . Anemia    history of  . Arthritis    spine  . Breast cyst, left 1980  . Cystocele 2012  . Diverticulitis   . Diverticulosis   . GERD (gastroesophageal reflux disease)   . H/O hypercholesterolemia   . H/O osteopenia   . H/O varicella   . H/O varicose veins   . Left leg swelling   . Macular degeneration   . Mitral valve prolapse   . Mumps   . Seasonal allergies   . Urge incontinence 2012  . Urinary frequency 2010    Patient Active Problem List   Diagnosis Date Noted  . Urinary, incontinence, stress female  06/16/2016  . Right carotid bruit 12/23/2014  . Exertional dyspnea 12/24/2013  . Bilateral arm numbness and tingling while sleeping - and shortly after waking 12/24/2013  . Poor balance 12/24/2013  . Edema of left lower extremity 12/21/2012  . Essential hypertension; borderline   . H/O hypercholesterolemia   . Osteoporosis - of the spine 09/22/2011    Past Surgical History:  Procedure Laterality Date  . ABDOMINAL HYSTERECTOMY    . BLADDER SUSPENSION N/A 06/16/2016   Procedure: TRANSVAGINAL TAPE (TVT) PROCEDURE;  Surgeon: Everett Graff, MD;  Location: [facility];  Service: Gynecology;  Laterality: N/A;  . BREAST CYST ASPIRATION  1964  . COLONOSCOPY     Dr. Earlean Shawl  . CYSTOCELE REPAIR N/A 06/16/2016   Procedure: ANTERIOR REPAIR (CYSTOCELE);  Surgeon: Everett Graff, MD;  Location: [facility];  Service: Gynecology;  Laterality: N/A;  . CYSTOSCOPY N/A 06/16/2016   Procedure: CYSTOSCOPY;  Surgeon: Everett Graff, MD;  Location: [facility];  Service: Gynecology;  Laterality: N/A;  see anterior repair  . Lower Extremity Venous Dopplers  01/09/2012   Right and left lower steroids: No evidence of thrombus or, thrombophlebitis; right and left GSV and SSV: No venous insufficiency. Normal exam.  . NM MYOVIEW LTD  March 2009   Negative for ischemia or infarction  . TRANSTHORACIC ECHOCARDIOGRAM  March 2009   Normal EF,  mild aortic sclerosis with no stenosis.     OB History    Gravida  3   Para  3   Term  3   Preterm      AB      Living  3     SAB      TAB      Ectopic      Multiple      Live Births  3            Home Medications    Prior to Admission medications   Medication Sig Start Date End Date Taking? Authorizing Provider  acetaminophen (TYLENOL) 500 MG tablet Take 1,000 mg by mouth every 8 (eight) hours as needed for mild pain.   Yes [provider]  carboxymethylcellulose (REFRESH PLUS) 0.5 % SOLN Place 2 drops into both eyes 3 (three) times daily as needed  (for dry eyes).   Yes [provider]  benzonatate (TESSALON) 100 MG capsule Take 1 capsule (100 mg total) by mouth at bedtime as needed for cough. Patient not taking: Reported on 02/15/2018 06/16/17   Wurst, [place], PA-C  cetirizine (ZYRTEC) 5 MG tablet Take 1 tablet (5 mg total) by mouth daily. 06/16/17   Wurst, [place], PA-C  cholecalciferol (VITAMIN D) 1000 units tablet Take 1,000 Units by mouth daily.    [provider]  Cyanocobalamin (VITAMIN B-12) 3000 MCG SUBL Place 3,000 mcg under the tongue 3 (three) times a week.    [provider]  desmopressin (DDAVP) 0.2 MG tablet Take 0.2 mg by mouth at bedtime. 01/08/18   [provider]  Lactobacillus Rhamnosus, GG, (CULTURELLE) CAPS Take 1 tablet by mouth daily.    [provider]  Magnesium 250 MG TABS Take 250 mg by mouth daily as needed (leg cramps).    [provider]  Multiple Vitamin (MULTIVITAMIN WITH MINERALS) TABS Take 1 tablet by mouth daily.    [provider]  Multiple Vitamins-Minerals (HAIR VITAMINS) TABS Take 1 tablet by mouth 3 (three) times a week. Hair supplement called Hair Volume    [provider]  MYRBETRIQ 25 MG TB24 tablet Take 25 mg by mouth daily. 02/06/18   [provider]  PREMARIN vaginal cream Place 0.5 g vaginally 3 (three) times a week. 12/25/17   [provider]  triamterene-hydrochlorothiazide (MAXZIDE-25) 37.5-25 MG tablet Take 1 tablet by mouth daily. 10/05/17   [provider]  trimethoprim (TRIMPEX) 100 MG tablet Take 100 mg by mouth daily. 02/06/18   [provider]    Family History Family History  Problem Relation Age of Onset  . Uterine cancer Mother 29  . Heart attack Brother 22    Social History Social History   Tobacco Use  . Smoking status: Never Smoker  . Smokeless tobacco: Never Used  Substance Use Topics  . Alcohol use: No  . Drug use: No     Allergies   Ivp dye [iodinated  diagnostic agents]   Review of Systems Review of Systems  Constitutional: Positive for activity change.  Neurological: Positive for speech difficulty and light-headedness.  All other systems reviewed and are negative.    Physical Exam Updated Vital Signs BP (!) 170/81   Pulse 70   Resp 18   Ht 5\' 3"  (1.6 m)   SpO2 100%   BMI 24.62 kg/m   Physical Exam Vitals signs and nursing note reviewed.  Constitutional:      Appearance: She is well-developed.  HENT:  Head: Normocephalic and atraumatic.  Eyes:     Extraocular Movements: Extraocular movements intact.     Pupils: Pupils are equal, round, and reactive to light.  Neck:     Musculoskeletal: Normal range of motion and neck supple.  Cardiovascular:     Rate and Rhythm: Normal rate.  Pulmonary:     Effort: Pulmonary effort is normal.  Abdominal:     General: Bowel sounds are normal.  Skin:    General: Skin is warm and dry.  Neurological:     General: No focal deficit present.     Mental Status: She is alert and oriented to person, place, and time. Mental status is at baseline.     Cranial Nerves: No cranial nerve deficit.     Sensory: No sensory deficit.     Motor: No weakness.     Coordination: Coordination normal.      ED Treatments / Results  Labs (all labs ordered are listed, but only abnormal results are displayed) Labs Reviewed  CBC - Abnormal; Notable for the following components:      Result Value   HCT 35.7 (*)    MCHC 36.4 (*)    All other components within normal limits  COMPREHENSIVE METABOLIC PANEL - Abnormal; Notable for the following components:   Sodium 127 (*)    Chloride 95 (*)    Creatinine, Ser 1.06 (*)    GFR calc non Af Amer 47 (*)    GFR calc Af Amer 54 (*)    All other components within normal limits  I-STAT CREATININE, ED - Abnormal; Notable for the following components:   Creatinine, Ser 1.10 (*)    All other components within normal limits  PROTIME-INR  APTT    DIFFERENTIAL  I-STAT TROPONIN, ED  CBG MONITORING, ED    EKG EKG Interpretation  Date/Time:  Thursday February 15 2018 16:50:55 EST Ventricular Rate:  64 PR Interval:    QRS Duration: 126 QT Interval:  451 QTC Calculation: 466 R Axis:   2 Text Interpretation:  Sinus rhythm Right bundle branch block No acute changes Confirmed by Varney Biles ([id number]) on 02/15/2018 5:24:52 PM   Radiology Ct Head Code Stroke Wo Contrast  Result Date: 02/15/2018 CLINICAL DATA:  Code stroke. Possible stroke. Slurred speech, aphasia EXAM: CT HEAD WITHOUT CONTRAST TECHNIQUE: Contiguous axial images were obtained from the base of the skull through the vertex without intravenous contrast. COMPARISON:  CT head 05/06/2008 FINDINGS: Brain: Generalized atrophy with progression from the prior study. Mild chronic white matter changes appear chronic Negative for acute infarct, hemorrhage, or mass. Vascular: Negative for hyperdense vessel Skull: Negative Sinuses/Orbits: Paranasal sinuses clear. Bilateral cataract surgery. No orbital mass. Other: None ASPECTS ([place] Stroke Program Early CT Score) - Ganglionic level infarction (caudate, lentiform nuclei, internal capsule, insula, M1-M3 cortex): 7 - Supraganglionic infarction (M4-M6 cortex): 3 Total score (0-10 with 10 being normal): 10 IMPRESSION: 1. No acute intracranial abnormality 2. ASPECTS is 10 3. These results were called by telephone at the time of interpretation on 02/15/2018 at 4:27 pm to Dr. Leonel Ramsay , who verbally acknowledged these results. Electronically Signed   By: Franchot Gallo M.D.   On: 02/15/2018 16:27    Procedures Procedures (including critical care time)  Medications Ordered in ED Medications  sodium chloride flush (NS) 0.9 % injection 3 mL (has no administration in time range)     Initial Impression / Assessment and Plan / ED Course  I have reviewed the triage vital signs and  the nursing notes.  Pertinent labs & imaging results that  were available during my care of the patient were reviewed by me and considered in my medical decision making (see chart for details).     85 year old female comes in with chief complaint of difficulty in speaking and dizziness.  She has no history of strokes.  She has history of mitral valve prolapse, but no other heart risk factors for stroke.  On exam, there is no objective deficits at this time.  Patient is having subjective difficulty in fluency.  Her NIH stroke scale for me is 0.  Code stroke was activated because of her symptoms and her being last normal around 3 PM.  Neurology team has seen the patient, on their evaluation her NIH stroke scale is 1 for subjective numbness on the left upper extremity.  Patient's EKG is showing no acute findings.  The other possibility of her symptoms could be that she had a tacky dysrhythmia, possibly A. fib which led her to be symptomatic.  At this time she is in sinus and therefore her symptoms are improving.   For now, neurology team recommends that patient be admitted to the hospital for further work-up.  If her symptoms get worse, then she is eligible for TPA until 7:00.  I have discussed the recommendation from neurology team to Dr. Sloan Leiter, hospitalist who will be admitting.  Final Clinical Impressions(s) / ED Diagnoses   Final diagnoses:  Acute ischemic stroke [facility])  Aphasia  Dizziness    ED Discharge Orders    None       Varney Biles, MD 02/15/18 1726

## 2018-02-15 NOTE — H&P (Addendum)
HISTORY AND PHYSICAL       PATIENT DETAILS Name: [name] Age: 85 y.o. Sex: female Date of Birth: 02/12/1927 Admit Date: 02/15/2018 [id number], Jenny Reichmann, MD   Patient coming from: home   CHIEF COMPLAINT:  Difficulty speaking  HPI: [name] is a 85 y.o. female with medical history significant of hypertension who presented today for evaluation of the above-noted complaints.  Apparently at 3 PM while she was on the phone with her son-she started having slurred speech-claims that her words were garbled and nonsensical.  She also had dizziness.  This episode lasted for approximately 10 minutes-with spontaneous resolution of her dysarthria.  She was subsequently brought to the emergency room, CT head was negative for acute abnormalities.  Neurology was consulted-she was deemed not to be a TPA candidate-the hospitalist service was asked to admit this patient for further evaluation and treatment.  ED Course:  CT head was negative-neurology evaluation completed-referred to hospitalist service.  Note: Lives at: Home Mobility: Independent Chronic Indwelling Foley:no   REVIEW OF SYSTEMS:  Constitutional:   No  weight loss, night sweats,  Fevers, chills, fatigue.  HEENT:    No headaches, Dysphagia,Tooth/dental problems,Sore throat  Cardio-vascular: No chest pain,Orthopnea, PND,lower extremity edema, anasarca, palpitations  GI:  No heartburn, indigestion, abdominal pain, nausea, vomiting, diarrhea, melena or hematochezia  Resp: No shortness of breath, cough, hemoptysis,plueritic chest pain.   Skin:  No rash or lesions.  GU:  No dysuria, change in color of urine, no urgency or frequency.  No flank pain.  Musculoskeletal: No joint pain or swelling.  No decreased range of motion.  No back pain.  Endocrine: No heat intolerance, no cold intolerance, no polyuria, no polydipsia  Psych: No change in mood or affect. No depression or anxiety.  No memory  loss.   ALLERGIES:   Allergies  Allergen Reactions  . Ivp Dye [Iodinated Diagnostic Agents] Hives    PAST MEDICAL HISTORY: Past Medical History:  Diagnosis Date  . Abnormal Pap smear 1975-76  . Anemia    history of  . Arthritis    spine  . Breast cyst, left 1980  . Cystocele 2012  . Diverticulitis   . Diverticulosis   . GERD (gastroesophageal reflux disease)   . H/O hypercholesterolemia   . H/O osteopenia   . H/O varicella   . H/O varicose veins   . Left leg swelling   . Macular degeneration   . Mitral valve prolapse   . Mumps   . Seasonal allergies   . Urge incontinence 2012  . Urinary frequency 2010    PAST SURGICAL HISTORY: Past Surgical History:  Procedure Laterality Date  . ABDOMINAL HYSTERECTOMY    . BLADDER SUSPENSION N/A 06/16/2016   Procedure: TRANSVAGINAL TAPE (TVT) PROCEDURE;  Surgeon: Everett Graff, MD;  Location: [facility];  Service: Gynecology;  Laterality: N/A;  . BREAST CYST ASPIRATION  1964  . COLONOSCOPY     Dr. Earlean Shawl  . CYSTOCELE REPAIR N/A 06/16/2016   Procedure: ANTERIOR REPAIR (CYSTOCELE);  Surgeon: Everett Graff, MD;  Location: [facility];  Service: Gynecology;  Laterality: N/A;  . CYSTOSCOPY N/A 06/16/2016   Procedure: CYSTOSCOPY;  Surgeon: Everett Graff, MD;  Location: [facility];  Service: Gynecology;  Laterality: N/A;  see anterior repair  . Lower Extremity Venous Dopplers  01/09/2012   Right and left lower steroids: No evidence of thrombus or, thrombophlebitis; right and left GSV and SSV: No venous insufficiency. Normal exam.  .  NM MYOVIEW LTD  March 2009   Negative for ischemia or infarction  . TRANSTHORACIC ECHOCARDIOGRAM  March 2009   Normal EF, mild aortic sclerosis with no stenosis.    MEDICATIONS AT HOME: Prior to Admission medications   Medication Sig Start Date End Date Taking? Authorizing Provider  acetaminophen (TYLENOL) 500 MG tablet Take 1,000 mg by mouth every 8 (eight) hours as needed for mild pain.   Yes [provider]  carboxymethylcellulose (REFRESH PLUS) 0.5 % SOLN Place 2 drops into both eyes 3 (three) times daily as needed (for dry eyes).   Yes [provider]  benzonatate (TESSALON) 100 MG capsule Take 1 capsule (100 mg total) by mouth at bedtime as needed for cough. Patient not taking: Reported on 02/15/2018 06/16/17   Wurst, [place], PA-C  cetirizine (ZYRTEC) 5 MG tablet Take 1 tablet (5 mg total) by mouth daily. 06/16/17   Wurst, [place], PA-C  cholecalciferol (VITAMIN D) 1000 units tablet Take 1,000 Units by mouth daily.    [provider]  Cyanocobalamin (VITAMIN B-12) 3000 MCG SUBL Place 3,000 mcg under the tongue 3 (three) times a week.    [provider]  desmopressin (DDAVP) 0.2 MG tablet Take 0.2 mg by mouth at bedtime. 01/08/18   [provider]  Lactobacillus Rhamnosus, GG, (CULTURELLE) CAPS Take 1 tablet by mouth daily.    [provider]  Magnesium 250 MG TABS Take 250 mg by mouth daily as needed (leg cramps).    [provider]  Multiple Vitamin (MULTIVITAMIN WITH MINERALS) TABS Take 1 tablet by mouth daily.    [provider]  Multiple Vitamins-Minerals (HAIR VITAMINS) TABS Take 1 tablet by mouth 3 (three) times a week. Hair supplement called Hair Volume    [provider]  MYRBETRIQ 25 MG TB24 tablet Take 25 mg by mouth daily. 02/06/18   [provider]  PREMARIN vaginal cream Place 0.5 g vaginally 3 (three) times a week. 12/25/17   [provider]  triamterene-hydrochlorothiazide (MAXZIDE-25) 37.5-25 MG tablet Take 1 tablet by mouth daily. 10/05/17   [provider]  trimethoprim (TRIMPEX) 100 MG tablet Take 100 mg by mouth daily. 02/06/18   [provider]    FAMILY HISTORY: Family History  Problem Relation Age of Onset  . Uterine cancer Mother 11  . Heart attack Brother 79    SOCIAL HISTORY:  reports that she has never smoked. She has never used smokeless tobacco.  She reports that she does not drink alcohol or use drugs.  PHYSICAL EXAM: Blood pressure (!) 170/81, pulse 70, resp. rate 18, height 5\' 3"  (1.6 m), SpO2 100 %.  General appearance :Awake, alert, not in any distress.  Eyes:, pupils equally reactive to light and accomodation,no scleral icterus.Pink conjunctiva HEENT: Atraumatic and Normocephalic Neck: supple, no JVD.  Resp:Good air entry bilaterally, no added sounds  CVS: S1 S2 regular, no murmurs.  GI: Bowel sounds present, Non tender and not distended with no gaurding, rigidity or rebound. Extremities: B/L Lower Ext shows no edema, both legs are warm to touch Neurology:  speech clear,Non focal, sensation is grossly intact. Psychiatric: Normal judgment and insight. Alert and oriented x 3. Normal mood. Musculoskeletal:gait appears to be normal.No digital cyanosis Skin:No Rash, warm and dry Wounds:N/A  LABS ON ADMISSION:  I have personally reviewed following labs and imaging studies  CBC: Recent Labs  Lab 02/15/18 1611  WBC 6.4  NEUTROABS 3.1  HGB 13.0  HCT 35.7*  MCV 82.3  PLT  979    Basic Metabolic Panel: Recent Labs  Lab 02/15/18 1611 02/15/18 1624  NA 127*  --   K 4.1  --   CL 95*  --   CO2 22  --   GLUCOSE 98  --   BUN 13  --   CREATININE 1.06* 1.10*  CALCIUM 9.5  --     GFR: CrCl cannot be calculated (Unknown ideal weight.).  Liver Function Tests: Recent Labs  Lab 02/15/18 1611  AST 21  ALT 16  ALKPHOS 54  BILITOT 0.9  PROT 7.7  ALBUMIN 4.2   No results for input(s): LIPASE, AMYLASE in the last 168 hours. No results for input(s): AMMONIA in the last 168 hours.  Coagulation Profile: Recent Labs  Lab 02/15/18 1611  INR 1.06    Cardiac Enzymes: No results for input(s): CKTOTAL, CKMB, CKMBINDEX, TROPONINI in the last 168 hours.  BNP (last 3 results) No results for input(s): PROBNP in the last 8760 hours.  HbA1C: No results for input(s): HGBA1C in the last 72 hours.  CBG: Recent Labs   Lab 02/15/18 1613  GLUCAP 90    Lipid Profile: No results for input(s): CHOL, HDL, LDLCALC, TRIG, CHOLHDL, LDLDIRECT in the last 72 hours.  Thyroid Function Tests: No results for input(s): TSH, T4TOTAL, FREET4, T3FREE, THYROIDAB in the last 72 hours.  Anemia Panel: No results for input(s): VITAMINB12, FOLATE, FERRITIN, TIBC, IRON, RETICCTPCT in the last 72 hours.  Urine analysis:    Component Value Date/Time   COLORURINE YELLOW 09/07/2016 1225   APPEARANCEUR CLEAR 09/07/2016 1225   LABSPEC 1.020 09/07/2016 1225   PHURINE 6.0 09/07/2016 1225   GLUCOSEU 50 (A) 09/07/2016 1225   HGBUR NEGATIVE 09/07/2016 1225   BILIRUBINUR NEGATIVE 09/07/2016 1225   BILIRUBINUR neg 08/24/2011 1746   KETONESUR NEGATIVE 09/07/2016 1225   PROTEINUR 30 (A) 09/07/2016 1225   UROBILINOGEN negative 08/24/2011 1746   UROBILINOGEN 0.2 09/30/2010 1626   NITRITE NEGATIVE 09/07/2016 1225   LEUKOCYTESUR NEGATIVE 09/07/2016 1225    Sepsis Labs: Lactic Acid, Venous No results found for: [place]   Microbiology: No results found for this or any previous visit (from the past 240 hour(s)).    RADIOLOGIC STUDIES ON ADMISSION: Ct Head Code Stroke Wo Contrast  Result Date: 02/15/2018 CLINICAL DATA:  Code stroke. Possible stroke. Slurred speech, aphasia EXAM: CT HEAD WITHOUT CONTRAST TECHNIQUE: Contiguous axial images were obtained from the base of the skull through the vertex without intravenous contrast. COMPARISON:  CT head 05/06/2008 FINDINGS: Brain: Generalized atrophy with progression from the prior study. Mild chronic white matter changes appear chronic Negative for acute infarct, hemorrhage, or mass. Vascular: Negative for hyperdense vessel Skull: Negative Sinuses/Orbits: Paranasal sinuses clear. Bilateral cataract surgery. No orbital mass. Other: None ASPECTS ([place] Stroke Program Early CT Score) - Ganglionic level infarction (caudate, lentiform nuclei, internal capsule, insula, M1-M3 cortex): 7  - Supraganglionic infarction (M4-M6 cortex): 3 Total score (0-10 with 10 being normal): 10 IMPRESSION: 1. No acute intracranial abnormality 2. ASPECTS is 10 3. These results were called by telephone at the time of interpretation on 02/15/2018 at 4:27 pm to Dr. Leonel Ramsay , who verbally acknowledged these results. Electronically Signed   By: Franchot Gallo M.D.   On: 02/15/2018 16:27    I have personally reviewed images of  CT head-No obvious CVA/hemorrhage seen.  EKG:  Personally reviewed.  Sinus rhythm.  ASSESSMENT AND PLAN: TIA versus CVA: Dysarthria has resolved-we will start on full dose aspirin.  Obtain MRI/MRA brain, carotid  Doppler and echocardiogram.  A1c in lipid panel will be ordered.  Patient will be monitored in a telemetry unit.  Will await further work-up and further recommendations from the stroke team.  Allow permissive hypertension.  Hypertension: Allow permissive hypertension-follow BP trend.  Mild hyponatremia: Appears euvolemic-gently hydrate and follow electrolytes-if sodium drops in spite of gentle hydration-we will then consider further work-up.  Hold desmopressin for today (apparently takes it for nocturnal enuresis)   Further plan will depend as patient's clinical course evolves and further radiologic and laboratory data become available. Patient will be monitored closely.  Above noted plan was discussed with patient/family  face to face at bedside, they were in agreement.   CONSULTS: Neurology  DVT Prophylaxis: Prophylactic Lovenox   Code Status: Full Code  Disposition Plan:  Discharge back home hopefully tomorrow once work-up is complete.  Admission status: Observation going to tele    Total time spent  55 minutes.Greater than 50% of this time was spent in counseling, explanation of diagnosis, planning of further management, and coordination of care.    Demontez Novack [facility]  If 7PM-7AM, please Patent [email or web address] 02/15/2018, 5:37 PM

## 2018-02-15 NOTE — Consult Note (Signed)
Neurology Consultation Reason for Consult: Difficulty speaking Referring Physician: Kathrynn Humble, A  CC: Difficulty speaking  History is obtained from: Patient  HPI: [name] is a 85 y.o. female who presents with transient episode of difficulty speaking.  She has not been feeling well all day, though no specific symptoms until around 3 PM.  At that time, she tried speaking and was unable to.  She states that something came out, but the words were garbled and nonsensical.  She did not have any trouble understanding others during that timeframe.  She states that this episode lasted for about 10 minutes.  She was "dizzy" during that timeframe as well but she would not endorse other lightheadedness or unsteadiness as her meaning of dizzy.  Since that time, her symptoms have markedly improved, but she still has some "dizziness."  And therefore code stroke was activated at the bridge.   LKW: 3 PM tpa given?: no, mild symptoms  ROS: A 14 point ROS was performed and is negative except as noted in the HPI.  Past Medical History:  Diagnosis Date  . Abnormal Pap smear 1975-76  . Anemia    history of  . Arthritis    spine  . Breast cyst, left 1980  . Cystocele 2012  . Diverticulitis   . Diverticulosis   . GERD (gastroesophageal reflux disease)   . H/O hypercholesterolemia   . H/O osteopenia   . H/O varicella   . H/O varicose veins   . Left leg swelling   . Macular degeneration   . Mitral valve prolapse   . Mumps   . Seasonal allergies   . Urge incontinence 2012  . Urinary frequency 2010     Family History  Problem Relation Age of Onset  . Uterine cancer Mother 55  . Heart attack Brother 79     Social History:  reports that she has never smoked. She has never used smokeless tobacco. She reports that she does not drink alcohol or use drugs.   Exam: Current vital signs: BP (!) 170/81   Pulse 70   Resp 18   Ht 5\' 3"  (1.6 m)   SpO2 100%   BMI 24.62 kg/m  Vital signs in  last 24 hours: Pulse Rate:  [63-70] 70 (01/30 1700) Resp:  [18] 18 (01/30 1700) BP: (170-176)/(80-81) 170/81 (01/30 1700) SpO2:  [100 %] 100 % (01/30 1630)   Physical Exam  Constitutional: Appears well-developed and well-nourished.  Psych: Affect appropriate to situation Eyes: No scleral injection HENT: No OP obstrucion Head: Normocephalic.  Cardiovascular: Normal rate and regular rhythm.  Respiratory: Effort normal, non-labored breathing GI: Soft.  No distension. There is no tenderness.  Skin: WDI  Neuro: Mental Status: Patient is awake, alert, oriented to person, place, month, year, and situation. Patient is able to give a clear and coherent history. No signs of aphasia or neglect Cranial Nerves: II: Visual Fields are full. Pupils are equal, round, and reactive to light.   III,IV, VI: EOMI without ptosis or diploplia.  V: Facial sensation is symmetric to temperature VII: Facial movement is symmetric.  VIII: hearing is intact to voice X: Uvula elevates symmetrically XI: Shoulder shrug is symmetric. XII: tongue is midline without atrophy or fasciculations.  Motor: Tone is normal. Bulk is normal. 5/5 strength was present in all four extremities.  Sensory: She has mild decrease sensation in the left arm compared to the right, symmetric in the legs Deep Tendon Reflexes: 2+ and symmetric in the biceps and patellae.  Plantars: Toes are downgoing bilaterally.  Cerebellar: FNF and HKS are intact bilaterally  I have reviewed labs in epic and the results pertinent to this consultation are: Hyponatremia at 127  I have reviewed the images obtained: CT head-unremarkable  Impression: 85 year old female with transient aphasia.  Her sodium is low, but the transient nature of the symptoms would argue against encephalopathy due to hyponatremia as an etiology, also the sodium is not very low.  With her preservation of memory, no evidence of seizure, I think that this is unlikely as  well.  At this time, I would favor treating this as transient ischemic attack.  She will need to be admitted for secondary risk factor modification.  Recommendations: - HgbA1c, fasting lipid panel - MRI, MRA  of the brain without contrast - Frequent neuro checks - Echocardiogram - Carotid dopplers - Prophylactic therapy-Antiplatelet med: Aspirin - dose 325mg  PO or 300mg  PR - Risk factor modification - Telemetry monitoring - Stroke team to follow   Roland Rack, MD [facility] [phone]  If 7pm- 7am, please page neurology on call as listed in [place].

## 2018-02-16 ENCOUNTER — Observation Stay (HOSPITAL_COMMUNITY): Payer: Medicare Other

## 2018-02-16 ENCOUNTER — Observation Stay (HOSPITAL_BASED_OUTPATIENT_CLINIC_OR_DEPARTMENT_OTHER): Payer: Medicare Other

## 2018-02-16 DIAGNOSIS — G459 Transient cerebral ischemic attack, unspecified: Secondary | ICD-10-CM

## 2018-02-16 DIAGNOSIS — I361 Nonrheumatic tricuspid (valve) insufficiency: Secondary | ICD-10-CM | POA: Diagnosis not present

## 2018-02-16 DIAGNOSIS — I10 Essential (primary) hypertension: Secondary | ICD-10-CM | POA: Diagnosis not present

## 2018-02-16 LAB — LIPID PANEL
Cholesterol: 185 mg/dL (ref 0–200)
HDL: 62 mg/dL (ref >40)
LDL Cholesterol: 113 mg/dL — ABNORMAL HIGH (ref 0–99)
Total CHOL/HDL Ratio: 3 RATIO
Triglycerides: 48 mg/dL (ref <150)
VLDL: 10 mg/dL (ref 0–40)

## 2018-02-16 LAB — BASIC METABOLIC PANEL
Anion gap: 9 (ref 5–15)
BUN: 12 mg/dL (ref 8–23)
CO2: 25 mmol/L (ref 22–32)
Calcium: 9.2 mg/dL (ref 8.9–10.3)
Chloride: 104 mmol/L (ref 98–111)
Creatinine, Ser: 1 mg/dL (ref 0.44–1.00)
GFR calc Af Amer: 58 mL/min — ABNORMAL LOW (ref >60)
GFR calc non Af Amer: 50 mL/min — ABNORMAL LOW (ref >60)
Glucose, Bld: 93 mg/dL (ref 70–99)
Potassium: 4.1 mmol/L (ref 3.5–5.1)
Sodium: 138 mmol/L (ref 135–145)

## 2018-02-16 LAB — HEMOGLOBIN A1C
Hgb A1c MFr Bld: 5.8 % — ABNORMAL HIGH (ref 4.8–5.6)
Mean Plasma Glucose: 119.76 mg/dL

## 2018-02-16 LAB — TSH: TSH: 2.427 u[IU]/mL (ref 0.350–4.500)

## 2018-02-16 LAB — ECHOCARDIOGRAM COMPLETE
Height: 63 in
Weight: 2183.44 oz

## 2018-02-16 MED ORDER — ASPIRIN EC 81 MG PO TBEC: 81.0000 mg | DELAYED_RELEASE_TABLET | Freq: Every day | ORAL | 11 refills | Status: DC

## 2018-02-16 MED ORDER — ATORVASTATIN CALCIUM 40 MG PO TABS
40.0000 mg | ORAL_TABLET | Freq: Every day | ORAL | Status: DC
  Administered 2018-02-16: 40 mg via ORAL
  Filled 2018-02-16 (×2): qty 1

## 2018-02-16 MED ORDER — ATORVASTATIN CALCIUM 20 MG PO TABS: 40.0000 mg | ORAL_TABLET | Freq: Every day | ORAL | 11 refills | Status: DC

## 2018-02-16 MED ORDER — ASPIRIN EC 81 MG PO TBEC: 81.0000 mg | DELAYED_RELEASE_TABLET | Freq: Every day | ORAL | Status: DC

## 2018-02-16 MED ORDER — ATORVASTATIN CALCIUM 10 MG PO TABS: 20.0000 mg | ORAL_TABLET | Freq: Every day | ORAL | Status: DC

## 2018-02-16 NOTE — Progress Notes (Signed)
Bilateral carotid duplex completed - Preliminary results in chart review CV Proc. [facility], [place] 02/16/2018, 9:43 AM

## 2018-02-16 NOTE — Progress Notes (Signed)
Pt being discharged home. Education and instructions provided to patient. IV removed. CCMD notified. Patient leaving unit via wheelchair.

## 2018-02-16 NOTE — Evaluation (Signed)
Physical Therapy Evaluation Patient Details Name: [name] MRN: [id number] DOB: 09-24-1927 Today's Date: 02/16/2018   History of Present Illness  [name] is a 85 y.o. female with medical history significant of hypertension admitted due to transient slurred speech, dizziness with elevated BP.  Scans negative for acute changes.  Clinical Impression  Patient presents with decreased mobility due to generalized weakness/imbalance/wooziness from acute illness and bedrest with history of sinus trouble and "a little" vertigo in the past.  Feel she will benefit from skilled PT in the acute setting and follow up HHPT at d/c.  Educated in fall prevention with walker, footwear, lighting, clear path to bedroom and initial assistance from family first couple of days back home.      Follow Up Recommendations Home health PT;Supervision for mobility/OOB    Equipment Recommendations  Rolling walker with 5" wheels    Recommendations for Other Services       Precautions / Restrictions Precautions Precautions: Fall Restrictions Weight Bearing Restrictions: No      Mobility  Bed Mobility Overal bed mobility: Modified Independent                Transfers Overall transfer level: Needs assistance   Transfers: Sit to/from Stand Sit to Stand: Min guard         General transfer comment: for balance  Ambulation/Gait Ambulation/Gait assistance: Min assist;Supervision Gait Distance (Feet): 200 Feet Assistive device: 1 person hand held assist;None Gait Pattern/deviations: Step-through pattern;Decreased stride length     General Gait Details: reports feeling unsteady at times, encouraged to walk with arm swing for improved balance and gait mechanics so progressed to supervision  Stairs            Wheelchair Mobility    Modified Rankin (Stroke Patients Only) Modified Rankin (Stroke Patients Only) Pre-Morbid Rankin Score: No symptoms Modified Rankin: Slight  disability     Balance Overall balance assessment: Needs assistance   Sitting balance-Leahy Scale: Good       Standing balance-Leahy Scale: Fair Standing balance comment: dynamic balance impaired especially during transitions with "wooziness"                             Pertinent Vitals/Pain Pain Assessment: No/denies pain(reports had leg cramps overnight)    Home Living Family/patient expects to be discharged to:: Private residence Living Arrangements: Spouse/significant other Available Help at Discharge: Family;Available 24 hours/day Type of Home: House Home Access: Stairs to enter Entrance Stairs-Rails: None Entrance Stairs-Number of Steps: 1 threshold Home Layout: One level Home Equipment: Cane - single point Additional Comments: spouse has a cane    Prior Function Level of Independence: Independent               Hand Dominance        Extremity/Trunk Assessment   Upper Extremity Assessment Upper Extremity Assessment: Overall WFL for tasks assessed    Lower Extremity Assessment Lower Extremity Assessment: Overall WFL for tasks assessed       Communication   Communication: No difficulties  Cognition Arousal/Alertness: Awake/alert Behavior During Therapy: WFL for tasks assessed/performed Overall Cognitive Status: Within Functional Limits for tasks assessed                                        General Comments General comments (skin integrity, edema, etc.): Reports history of sinus problems  and has had vertigo in the past.  Educated on how acute illness with bedrest can exacerbate previously adapted vestibular issues and need for continued mobility with assist while a little unsteady and slowly getting back into normal routine.     Exercises     Assessment/Plan    PT Assessment Patient needs continued PT services  PT Problem List Decreased balance;Decreased mobility;Decreased knowledge of use of DME;Decreased activity  tolerance       PT Treatment Interventions DME instruction;Functional mobility training;Balance training;Patient/family education;Gait training;Therapeutic activities;Therapeutic exercise;Neuromuscular re-education    PT Goals (Current goals can be found in the Care Plan section)  Acute Rehab PT Goals Patient Stated Goal: to return to independent, get rid of the dizziness PT Goal Formulation: With patient Time For Goal Achievement: 02/23/18 Potential to Achieve Goals: Good    Frequency Min 4X/week   Barriers to discharge        Co-evaluation               AM-PAC PT "6 Clicks" Mobility  Outcome Measure Help needed turning from your back to your side while in a flat bed without using bedrails?: None Help needed moving from lying on your back to sitting on the side of a flat bed without using bedrails?: None Help needed moving to and from a bed to a chair (including a wheelchair)?: A Little Help needed standing up from a chair using your arms (e.g., wheelchair or bedside chair)?: A Little Help needed to walk in hospital room?: A Little Help needed climbing 3-5 steps with a railing? : A Little 6 Click Score: 20    End of Session Equipment Utilized During Treatment: Gait belt Activity Tolerance: Patient tolerated treatment well Patient left: in chair;with chair alarm set;with call bell/phone within reach   PT Visit Diagnosis: Other abnormalities of gait and mobility (R26.89);Dizziness and giddiness (R42)    Time: 0930-1000 PT Time Calculation (min) (ACUTE ONLY): 30 min   Charges:   PT Evaluation $PT Eval Moderate Complexity: 1 Mod PT Treatments $Gait Training: 8-22 mins        Magda Kiel, [place] Acute Rehabilitation Services [phone] 02/16/2018   Reginia Naas 02/16/2018, 10:38 AM

## 2018-02-16 NOTE — Evaluation (Signed)
Speech Language Pathology Evaluation Patient Details Name: [name] MRN: [id number] DOB: Jul 27, 1927 Today's Date: 02/16/2018 Time: 4098-1191 SLP Time Calculation (min) (ACUTE ONLY): 25 min  Problem List:  Patient Active Problem List   Diagnosis Date Noted  . CVA (cerebral vascular accident) ([place]) 02/15/2018  . Urinary, incontinence, stress female 06/16/2016  . Right carotid bruit 12/23/2014  . Exertional dyspnea 12/24/2013  . Bilateral arm numbness and tingling while sleeping - and shortly after waking 12/24/2013  . Poor balance 12/24/2013  . Edema of left lower extremity 12/21/2012  . Essential hypertension; borderline   . H/O hypercholesterolemia   . Osteoporosis - of the spine 09/22/2011   Past Medical History:  Past Medical History:  Diagnosis Date  . Abnormal Pap smear 1975-76  . Anemia    history of  . Arthritis    spine  . Breast cyst, left 1980  . Cystocele 2012  . Diverticulitis   . Diverticulosis   . GERD (gastroesophageal reflux disease)   . H/O hypercholesterolemia   . H/O osteopenia   . H/O varicella   . H/O varicose veins   . Left leg swelling   . Macular degeneration   . Mitral valve prolapse   . Mumps   . Seasonal allergies   . Urge incontinence 2012  . Urinary frequency 2010   Past Surgical History:  Past Surgical History:  Procedure Laterality Date  . ABDOMINAL HYSTERECTOMY    . BLADDER SUSPENSION N/A 06/16/2016   Procedure: TRANSVAGINAL TAPE (TVT) PROCEDURE;  Surgeon: Everett Graff, MD;  Location: [facility];  Service: Gynecology;  Laterality: N/A;  . BREAST CYST ASPIRATION  1964  . COLONOSCOPY     Dr. Earlean Shawl  . CYSTOCELE REPAIR N/A 06/16/2016   Procedure: ANTERIOR REPAIR (CYSTOCELE);  Surgeon: Everett Graff, MD;  Location: [facility];  Service: Gynecology;  Laterality: N/A;  . CYSTOSCOPY N/A 06/16/2016   Procedure: CYSTOSCOPY;  Surgeon: Everett Graff, MD;  Location: [facility];  Service: Gynecology;  Laterality: N/A;  see anterior repair  .  Lower Extremity Venous Dopplers  01/09/2012   Right and left lower steroids: No evidence of thrombus or, thrombophlebitis; right and left GSV and SSV: No venous insufficiency. Normal exam.  . NM MYOVIEW LTD  March 2009   Negative for ischemia or infarction  . TRANSTHORACIC ECHOCARDIOGRAM  March 2009   Normal EF, mild aortic sclerosis with no stenosis.   HPI:  Pt is a 85 y.o. female with medical history significant of hypertension who presented to the ED secondary to difficulty speaking. The MRI of the brain revealed very small chronic infarcts within right cerebellar hemisphere and left caudate head but was negative for any acute intracranial abnormality. The MRA of the head revealed a 2 mm left A-comm aneurysm. and left A2/3 segment of moderate stenosis.   Assessment / Plan / Recommendation Clinical Impression  Pt participated in speech/language evaluation. The pt denied any baseline speech/language deficits but stated that immediately prior to admission she presented with dysarthria. Her  speech, language, and cognitve skills appear to be within functional limits at this time. Further skilled SLP services are not clinically indicated at this time.     SLP Assessment  SLP Recommendation/Assessment: Patient does not need any further Speech Lanaguage Pathology Services SLP Visit Diagnosis: Dysarthria and anarthria (R47.1)    Follow Up Recommendations  None    Frequency and Duration           SLP Evaluation Cognition  Overall Cognitive Status: Within Functional  Limits for tasks assessed Arousal/Alertness: Awake/alert Orientation Level: Oriented X4 Attention: Focused Focused Attention: Appears intact Memory: Appears intact Awareness: Appears intact Problem Solving: Appears intact Executive Function: Reasoning Reasoning: Appears intact Safety/Judgment: Appears intact       Comprehension  Auditory Comprehension Overall Auditory Comprehension: Appears within functional limits for  tasks assessed Yes/No Questions: Within Functional Limits Commands: Within Functional Limits Conversation: Complex Reading Comprehension Reading Status: Within funtional limits    Expression Expression Primary Mode of Expression: Verbal Verbal Expression Overall Verbal Expression: Appears within functional limits for tasks assessed Initiation: No impairment Automatic Speech: Counting;Day of week;Month of year Level of Generative/Spontaneous Verbalization: Phrase;Sentence Repetition: No impairment Naming: No impairment Pragmatics: No impairment   Oral / Motor  Oral Motor/Sensory Function Overall Oral Motor/Sensory Function: Mild impairment Facial ROM: Within Functional Limits Facial Symmetry: Within Functional Limits Facial Strength: Within Functional Limits Facial Sensation: Within Functional Limits Lingual ROM: Within Functional Limits Lingual Symmetry: Within Functional Limits Lingual Strength: Reduced;Suspected CN XII (hypoglossal) dysfunction Lingual Sensation: Within Functional Limits Velum: Within Functional Limits Mandible: Within Functional Limits Motor Speech Overall Motor Speech: Appears within functional limits for tasks assessed Respiration: Within functional limits Phonation: Normal Resonance: Within functional limits Articulation: Within functional limitis Intelligibility: Intelligible   [name], [place], [place] Office number [phone] Pager [place] 02/16/2018, 11:38 AM

## 2018-02-16 NOTE — Evaluation (Signed)
Occupational Therapy Evaluation Patient Details Name: [name] MRN: [id number] DOB: [birth year] Today's Date: 02/16/2018    History of Present Illness [name] is a 85 y.o. female with medical history significant of hypertension admitted due to transient slurred speech, dizziness with elevated BP.  Scans negative for acute changes.   Clinical Impression   Pt admitted with the above diagnoses and presents with below problem list. Pt will benefit from continued acute OT to address the below listed deficits and maximize independence with basic ADLs prior to d/c home. PTA pt was independent with ADLs, active. Pt is currently min guard with LB ADLs and functional mobility. Dizziness somewhat a limiting factor this session. Pt reports dizziness worsens with OOB activity/mobility and lessens, but does not completely resolve, with returning to supine/reclined position.      Follow Up Recommendations  Home health OT    Equipment Recommendations  None recommended by OT    Recommendations for Other Services       Precautions / Restrictions Precautions Precautions: Fall Restrictions Weight Bearing Restrictions: No      Mobility Bed Mobility Overal bed mobility: Modified Independent                Transfers Overall transfer level: Needs assistance   Transfers: Sit to/from Stand Sit to Stand: Min guard         General transfer comment: for balance    Balance Overall balance assessment: Needs assistance   Sitting balance-Leahy Scale: Good       Standing balance-Leahy Scale: Fair Standing balance comment: dynamic balance impaired especially during transitions with "wooziness"                           ADL either performed or assessed with clinical judgement   ADL Overall ADL's : Needs assistance/impaired Eating/Feeding: Set up;Sitting   Grooming: Min guard;Standing   Upper Body Bathing: Set up;Sitting   Lower Body Bathing: Min guard;Sit  to/from stand   Upper Body Dressing : Set up;Sitting   Lower Body Dressing: Min guard;Sit to/from stand   Toilet Transfer: Min guard;Ambulation   Toileting- Clothing Manipulation and Hygiene: Min guard   Tub/ Banker: Min guard   Functional mobility during ADLs: Warden/ranger      Pertinent Vitals/Pain Pain Assessment: No/denies pain     Hand Dominance     Extremity/Trunk Assessment Upper Extremity Assessment Upper Extremity Assessment: Overall WFL for tasks assessed   Lower Extremity Assessment Lower Extremity Assessment: Defer to PT evaluation       Communication Communication Communication: No difficulties   Cognition Arousal/Alertness: Awake/alert Behavior During Therapy: WFL for tasks assessed/performed Overall Cognitive Status: Within Functional Limits for tasks assessed                                     General Comments  Reports dizziness which worsens with OOB activity    Exercises     Shoulder Instructions      Home Living Family/patient expects to be discharged to:: Private residence Living Arrangements: Spouse/significant other Available Help at Discharge: Family;Available 24 hours/day Type of Home: House Home Access: Stairs to enter [employer] of Steps: 1 threshold Entrance Stairs-Rails: None Home Layout: One level     Bathroom  Shower/Tub: Tub/shower unit         Home Equipment: Kasandra Knudsen - single point   Additional Comments: spouse has a cane  Lives With: Spouse    Prior Functioning/Environment Level of Independence: Independent        Comments: active, reports she enjoys walking her dog outside        OT Problem List: Decreased activity tolerance;Impaired balance (sitting and/or standing);Decreased knowledge of use of DME or AE;Decreased knowledge of precautions      OT Treatment/Interventions: Self-care/ADL training;DME and/or AE  instruction;Therapeutic activities;Patient/family education;Balance training    OT Goals(Current goals can be found in the care plan section) Acute Rehab OT Goals Patient Stated Goal: to return to independent, get rid of the dizziness OT Goal Formulation: With patient Time For Goal Achievement: 02/23/18 Potential to Achieve Goals: Good ADL Goals Pt Will Perform Grooming: Independently;standing Pt Will Perform Tub/Shower Transfer: Independently;ambulating  OT Frequency: Min 2X/week   Barriers to D/C:            Co-evaluation              AM-PAC OT "6 Clicks" Daily Activity     Outcome Measure Help from another person eating meals?: None Help from another person taking care of personal grooming?: None Help from another person toileting, which includes using toliet, bedpan, or urinal?: None Help from another person bathing (including washing, rinsing, drying)?: A Little Help from another person to put on and taking off regular upper body clothing?: None Help from another person to put on and taking off regular lower body clothing?: None 6 Click Score: 23   End of Session    Activity Tolerance: Other (comment)(dizzy) Patient left: in bed;with call bell/phone within reach;with bed alarm set  OT Visit Diagnosis: Unsteadiness on feet (R26.81);Dizziness and giddiness (R42)                Time: 0233-4356 OT Time Calculation (min): 13 min Charges:  OT General Charges $OT Visit: 1 Visit OT Evaluation $OT Eval Low Complexity: [place], OT Acute Rehabilitation Services Pager: [phone] Office: [phone]   Hortencia Pilar 02/16/2018, 12:25 PM

## 2018-02-16 NOTE — Progress Notes (Deleted)
Bilateral lower extremity venous duplex completed. Preliminary results in Chart review CV Proc. Right Negative Left  Positive for chronic popliteal DVT. [place] Itsel Opfer,RVS 02/16/2018 12:39 PM

## 2018-02-16 NOTE — Care Management Note (Signed)
Case Management Note  Patient Details  Name: [name] MRN: [id number] Date of Birth: 11-27-1927  Subjective/Objective:    Pt in to r/o CVA. She is from home with her spouse that can provide supervision.  DME: cane but she doesn't use No issues obtaining her medications. Denies issues with transportation.                Action/Plan: Recommendations are for [facility] services. CM provided her choice and [facility] selected. Cory with [facility] notified and will watch for orders. Md please place orders for [facility] and F2[facility].  Pt with orders for rolling walker. James with [facility] DME notified and will deliver to the room. Pt has transportation home when medically ready.   Expected Discharge Date:                  Expected Discharge Plan:  [place]  In-House Referral:     Discharge planning Services  CM Consult  Post Acute Care Choice:  Home Health, Durable Medical Equipment Choice offered to:  Patient, Adult Children  DME Arranged:  Walker rolling DME Agency:  [place] Arranged:    [place]:  [place]  Status of Service:  In process, will continue to follow  If discussed at Long Length of Stay Meetings, dates discussed:    Additional Comments:  Pollie Friar, RN 02/16/2018, 3:39 PM

## 2018-02-16 NOTE — Progress Notes (Signed)
  Echocardiogram 2D Echocardiogram has been performed.  [name] 02/16/2018, 1:29 PM

## 2018-02-16 NOTE — Discharge Summary (Signed)
Physician Discharge Summary  [name] QIW:[id number] DOB: 1928/01/03 DOA: 02/15/2018  PCP: Catherine Orn, MD  Admit date: 02/15/2018 Discharge date: 02/16/2018  Admitted From: Home  Disposition:  Home   Recommendations for Outpatient Follow-up:  1. Follow up with PCP in 1-2 weeks 2. Please obtain BMP/CBC in one week   Home Health: Yes  Equipment/Devices: Rolling walker  Discharge Condition: Good  CODE STATUS: FULL Diet recommendation: Cardiac  Brief/Interim Summary: [name] is a 85 y.o. F with HTN who presented for acute difficulty speaking.  Shortly prior to arrival, she was on phone with son, felt her speech was garbled and nonsensical, and she became dizzy.  This lasted for 10 minutes then resolved.  In the ER, her CT head was normal, and tPA was not recommended due to rapid resolution of symptoms.            PRINCIPAL HOSPITAL DIAGNOSIS: Transient ischemic attack    Discharge Diagnoses:   TIA MRI brain showed no infarction.  Carotid imaging showed no >70% stenoses.  Echocardiogram showed no source of embolism, normal EF.  Telemetry was unremarkable.  Lipids showed slightly elevated LDL cholesterol.  HgbA1c was normal.    Neurology evaluated the patient, felt TIA was possible, warranted risk factor reduction with: Addition of aspirin 81 mg daily and atorvastatin 20 mg daily  Hypertension BP mildly elevated off home antihypertensives.          Discharge Instructions  Discharge Instructions    Diet - low sodium heart healthy   Complete by:  As directed    Discharge instructions   Complete by:  As directed    From Dr. Loleta Thornton: You were admitted with speech trouble that was from a TIA. A TIA is another name for a "mini-stroke".   In your case, none of your testing showed signs of another worse stroke about to happen. The key, therefore, to preventing another stroke is to: Take a full aspirin 81 mg daily from now on  Control your cholesterol  with atorvastatin/Lipitor 20 mg nightly   Follow up with the Neurologists (stroke doctors) in 4-6 weeks  Call your primary care doctor for a follow up appointment in 1 week   Increase activity slowly   Complete by:  As directed      Allergies as of 02/16/2018      Reactions   Ivp Dye [iodinated Diagnostic Agents] Hives   Myrbetriq [mirabegron] Other (See Comments)   "Makes me urinate more frequently"      Medication List    STOP taking these medications   desmopressin 0.2 MG tablet Commonly known as:  DDAVP     TAKE these medications   acetaminophen 500 MG tablet Commonly known as:  TYLENOL Take 1,000 mg by mouth every 8 (eight) hours as needed for mild pain.   aspirin EC 81 MG tablet Take 1 tablet (81 mg total) by mouth daily.   atorvastatin 20 MG tablet Commonly known as:  LIPITOR Take 2 tablets (40 mg total) by mouth daily at 6 PM.   carboxymethylcellulose 0.5 % Soln Commonly known as:  REFRESH PLUS Place 2 drops into both eyes 3 (three) times daily as needed (for dry eyes).   cetirizine 5 MG tablet Commonly known as:  ZYRTEC Take 1 tablet (5 mg total) by mouth daily.   cholecalciferol 1000 units tablet Commonly known as:  VITAMIN D Take 1,000 Units by mouth daily.   CULTURELLE Caps Take 1 capsule by mouth every morning.  HAIR VITAMINS Tabs Take 1 tablet by mouth 3 (three) times a week. Hair supplement called Hair Volume   Magnesium 250 MG Tabs Take 250 mg by mouth daily as needed (leg cramps).   multivitamin with minerals Tabs tablet Take 1 tablet by mouth daily.   PREMARIN vaginal cream Generic drug:  conjugated estrogens Place 0.5 g vaginally 3 (three) times a week.   trimethoprim 100 MG tablet Commonly known as:  TRIMPEX Take 100 mg by mouth daily.   Vitamin B-12 3000 MCG Subl Place 3,000 mcg under the tongue 3 (three) times a week.            Durable Medical Equipment  (From admission, onward)         Start     Ordered    02/16/18 1446  For home use only DME Walker rolling  Once    Question:  Patient needs a walker to treat with the following condition  Answer:  TIA (transient ischemic attack)   02/16/18 1446         Follow-up Information    [facility] Follow up.   Specialty:  Home Health Services Why:  They will contact you for the first visit. Contact information: [place] [address] [phone]          Allergies  Allergen Reactions  . Ivp Dye [Iodinated Diagnostic Agents] Hives  . Myrbetriq [Mirabegron] Other (See Comments)    "Makes me urinate more frequently"    Consultations:  Neurology   Procedures/Studies: Mr Brain 44 Contrast  Result Date: 02/16/2018 CLINICAL DATA:  85 y/o F; transient episode of slurred speech and dizziness. Stroke evaluation. EXAM: MRI HEAD WITHOUT CONTRAST MRA HEAD WITHOUT CONTRAST TECHNIQUE: Multiplanar, multiecho pulse sequences of the brain and surrounding structures were obtained without intravenous contrast. Angiographic images of the head were obtained using MRA technique without contrast. COMPARISON:  02/15/2018 CT head FINDINGS: MRI HEAD FINDINGS Brain: No acute infarction, hemorrhage, hydrocephalus, extra-axial collection or mass lesion. Very small chronic infarctions within the right cerebellar hemisphere and the left caudate head. Punctate nonspecific T2 FLAIR hyperintensities in subcortical and periventricular white matter are compatible with mild chronic microvascular ischemic changes. Moderate volume loss of the brain. Vascular: As below. Skull and upper cervical spine: Normal marrow signal. Sinuses/Orbits: Negative. Other: Bilateral intra-ocular lens replacement. MRA HEAD FINDINGS Internal carotid arteries:  Patent. Anterior cerebral arteries: Patent. 2 mm posteriorly directed saccular aneurysm of left A-comm/A1 junction (series 7, image 113). Left A2/3 segment of moderate stenosis. Middle cerebral arteries:  Patent. Anterior communicating artery: Patent. Posterior communicating arteries:  Patent. Posterior cerebral arteries:  Patent. Basilar artery:  Patent. Vertebral arteries:  Patent. No additional evidence of high-grade stenosis, large vessel occlusion, or aneurysm. IMPRESSION: MRI head: 1. No acute intracranial abnormality identified. 2. Mild chronic microvascular ischemic changes and moderate volume loss of the brain. Very small chronic infarcts within right cerebellar hemisphere and left caudate head. MRA head: 1. 2 mm left A-comm aneurysm. 2. Left A2/3 segment of moderate stenosis. 3. No additional aneurysm or stenosis.  No large vessel occlusion. Electronically Signed   By: Kristine Garbe M.D.   On: 02/16/2018 04:51   Mr Virgel Paling ZL Contrast  Result Date: 02/16/2018 CLINICAL DATA:  85 y/o F; transient episode of slurred speech and dizziness. Stroke evaluation. EXAM: MRI HEAD WITHOUT CONTRAST MRA HEAD WITHOUT CONTRAST TECHNIQUE: Multiplanar, multiecho pulse sequences of the brain and surrounding structures were obtained without intravenous contrast. Angiographic images  of the head were obtained using MRA technique without contrast. COMPARISON:  02/15/2018 CT head FINDINGS: MRI HEAD FINDINGS Brain: No acute infarction, hemorrhage, hydrocephalus, extra-axial collection or mass lesion. Very small chronic infarctions within the right cerebellar hemisphere and the left caudate head. Punctate nonspecific T2 FLAIR hyperintensities in subcortical and periventricular white matter are compatible with mild chronic microvascular ischemic changes. Moderate volume loss of the brain. Vascular: As below. Skull and upper cervical spine: Normal marrow signal. Sinuses/Orbits: Negative. Other: Bilateral intra-ocular lens replacement. MRA HEAD FINDINGS Internal carotid arteries:  Patent. Anterior cerebral arteries: Patent. 2 mm posteriorly directed saccular aneurysm of left A-comm/A1 junction (series 7, image 113). Left  A2/3 segment of moderate stenosis. Middle cerebral arteries: Patent. Anterior communicating artery: Patent. Posterior communicating arteries:  Patent. Posterior cerebral arteries:  Patent. Basilar artery:  Patent. Vertebral arteries:  Patent. No additional evidence of high-grade stenosis, large vessel occlusion, or aneurysm. IMPRESSION: MRI head: 1. No acute intracranial abnormality identified. 2. Mild chronic microvascular ischemic changes and moderate volume loss of the brain. Very small chronic infarcts within right cerebellar hemisphere and left caudate head. MRA head: 1. 2 mm left A-comm aneurysm. 2. Left A2/3 segment of moderate stenosis. 3. No additional aneurysm or stenosis.  No large vessel occlusion. Electronically Signed   By: Kristine Garbe M.D.   On: 02/16/2018 04:51   Ct Head Code Stroke Wo Contrast  Result Date: 02/15/2018 CLINICAL DATA:  Code stroke. Possible stroke. Slurred speech, aphasia EXAM: CT HEAD WITHOUT CONTRAST TECHNIQUE: Contiguous axial images were obtained from the base of the skull through the vertex without intravenous contrast. COMPARISON:  CT head 05/06/2008 FINDINGS: Brain: Generalized atrophy with progression from the prior study. Mild chronic white matter changes appear chronic Negative for acute infarct, hemorrhage, or mass. Vascular: Negative for hyperdense vessel Skull: Negative Sinuses/Orbits: Paranasal sinuses clear. Bilateral cataract surgery. No orbital mass. Other: None ASPECTS ([place] Stroke Program Early CT Score) - Ganglionic level infarction (caudate, lentiform nuclei, internal capsule, insula, M1-M3 cortex): 7 - Supraganglionic infarction (M4-M6 cortex): 3 Total score (0-10 with 10 being normal): 10 IMPRESSION: 1. No acute intracranial abnormality 2. ASPECTS is 10 3. These results were called by telephone at the time of interpretation on 02/15/2018 at 4:27 pm to Dr. Leonel Ramsay , who verbally acknowledged these results. Electronically Signed   By:  Franchot Gallo M.D.   On: 02/15/2018 16:27   Vas US Carotid (at [place] Only)  Result Date: 02/16/2018 Carotid Arterial Duplex Study Indications: CVA. Performing Technologist: Toma Copier RVS  Examination Guidelines: A complete evaluation includes B-mode imaging, spectral Doppler, color Doppler, and power Doppler as needed of all accessible portions of each vessel. Bilateral testing is considered an integral part of a complete examination. Limited examinations for reoccurring indications may be performed as noted.  Right Carotid Findings: +----------+--------+--------+--------+------------+-------------------------+           PSV cm/sEDV cm/sStenosisDescribe    Comments                  +----------+--------+--------+--------+------------+-------------------------+ CCA Prox  87      10                          mild intimal changes      +----------+--------+--------+--------+------------+-------------------------+ CCA Distal49      8                                                     +----------+--------+--------+--------+------------+-------------------------+  ICA Prox  57      9                           mild intimal changes      +----------+--------+--------+--------+------------+-------------------------+ ICA Mid   95      21                                                    +----------+--------+--------+--------+------------+-------------------------+ ICA Distal106     25                                                    +----------+--------+--------+--------+------------+-------------------------+ ECA       68      1               heterogenousmild plaque at the origin +----------+--------+--------+--------+------------+-------------------------+ +----------+--------+-------+--------+-------------------+           PSV cm/sEDV cmsDescribeArm Pressure (mmHG) +----------+--------+-------+--------+-------------------+ [id number]                                          +----------+--------+-------+--------+-------------------+ +---------+--------+--+--------+-+ VertebralPSV cm/s52EDV cm/s9 +---------+--------+--+--------+-+  Left Carotid Findings: +----------+--------+--------+--------+------------+-------------------------+           PSV cm/sEDV cm/sStenosisDescribe    Comments                  +----------+--------+--------+--------+------------+-------------------------+ CCA Prox  117     17                                                    +----------+--------+--------+--------+------------+-------------------------+ CCA Distal69      11                          mild intimal changes      +----------+--------+--------+--------+------------+-------------------------+ ICA Prox  80      13                          mild intimal changes      +----------+--------+--------+--------+------------+-------------------------+ ICA Mid   90      23                                                    +----------+--------+--------+--------+------------+-------------------------+ ICA Distal102     20                                                    +----------+--------+--------+--------+------------+-------------------------+ ECA       75      2  heterogenousmild plaque at the origin +----------+--------+--------+--------+------------+-------------------------+ +----------+--------+--------+--------+-------------------+ SubclavianPSV cm/sEDV cm/sDescribeArm Pressure (mmHG) +----------+--------+--------+--------+-------------------+           109                                         +----------+--------+--------+--------+-------------------+ +---------+--------+--+--------+--+ VertebralPSV cm/s51EDV cm/s11 +---------+--------+--+--------+--+  Summary: Right Carotid: There is no evidence of stenosis in the right ICA. Left Carotid: There is no evidence of stenosis in the left ICA.  Vertebrals:  Bilateral vertebral arteries demonstrate antegrade flow. Subclavians: Normal flow hemodynamics were seen in bilateral subclavian              arteries. *See table(s) above for measurements and observations.     Preliminary   Echocardiogram 1/31 IMPRESSIONS    1. The left ventricle has hyperdynamic systolic function of >29%. The cavity size is decreased. There is mild left ventricular wall thickness. Echo evidence of impaired relaxation diastolic filling patterns.  2. Normal left atrial size.  3. Normal right atrial size.  4. The mitral valve thickened. There is mild thickening and mildly calcified. There is mild mitral annular calcification present. Regurgitation is mild by color flow Doppler.  5. Normal tricuspid valve.  6. Tricuspid regurgitation is mild.  7. The aortic valve tricuspid. There is moderate thickening and moderate calcification of the aortic valve.  8. No atrial level shunt detected by color flow Doppler.         Subjective: No confusion.  No further speech disturbance. No new focal weakness or numbness. No fever or seizure.  Discharge Exam: Vitals:   02/16/18 1023 02/16/18 1539  BP: (!) 147/86 (!) 150/70  Pulse: 71 70  Resp: 19 17  Temp: 97.6 F (36.4 C) 97.9 F (36.6 C)  SpO2: 95% 96%   Vitals:   02/16/18 0200 02/16/18 0500 02/16/18 1023 02/16/18 1539  BP: (!) 146/80 (!) 164/63 (!) 147/86 (!) 150/70  Pulse: 67 61 71 70  Resp:   19 17  Temp: 97.8 F (36.6 C) 97.8 F (36.6 C) 97.6 F (36.4 C) 97.9 F (36.6 C)  TempSrc: Axillary Axillary Oral Oral  SpO2: 95% 96% 95% 96%  Weight:      Height:        General: Pt is alert, awake, not in acute distress Cardiovascular: RRR, nl S1-S2, no murmurs appreciated.   No LE edema.   Respiratory: Normal respiratory rate and rhythm.  CTAB without rales or wheezes. Abdominal: Abdomen soft and non-tender.  No distension or HSM.   Neuro/Psych: Strength symmetric in upper and lower extremities.   Judgment and insight appear normal.   The results of significant diagnostics from this hospitalization (including imaging, microbiology, ancillary and laboratory) are listed below for reference.     Microbiology: No results found for this or any previous visit (from the past 240 hour(s)).   Labs: BNP (last 3 results) No results for input(s): BNP in the last 8760 hours. Basic Metabolic Panel: Recent Labs  Lab 02/15/18 1611 02/15/18 1624 02/16/18 0521  NA 127*  --  138  K 4.1  --  4.1  CL 95*  --  104  CO2 22  --  25  GLUCOSE 98  --  93  BUN 13  --  12  CREATININE 1.06* 1.10* 1.00  CALCIUM 9.5  --  9.2   Liver Function Tests: Recent Labs  Lab 02/15/18 1611  AST 21  ALT 16  ALKPHOS 54  BILITOT 0.9  PROT 7.7  ALBUMIN 4.2   No results for input(s): LIPASE, AMYLASE in the last 168 hours. No results for input(s): AMMONIA in the last 168 hours. CBC: Recent Labs  Lab 02/15/18 1611  WBC 6.4  NEUTROABS 3.1  HGB 13.0  HCT 35.7*  MCV 82.3  PLT 218   Cardiac Enzymes: No results for input(s): CKTOTAL, CKMB, CKMBINDEX, TROPONINI in the last 168 hours. BNP: Invalid input(s): POCBNP CBG: Recent Labs  Lab 02/15/18 1613  GLUCAP 90   D-Dimer No results for input(s): DDIMER in the last 72 hours. Hgb A1c Recent Labs    02/16/18 0521  HGBA1C 5.8*   Lipid Profile Recent Labs    02/16/18 0521  CHOL 185  HDL 62  LDLCALC 113*  TRIG 48  CHOLHDL 3.0   Thyroid function studies Recent Labs    02/16/18 0521  TSH 2.427   Anemia work up No results for input(s): VITAMINB12, FOLATE, FERRITIN, TIBC, IRON, RETICCTPCT in the last 72 hours. Urinalysis    Component Value Date/Time   COLORURINE YELLOW 09/07/2016 1225   APPEARANCEUR CLEAR 09/07/2016 1225   LABSPEC 1.020 09/07/2016 1225   PHURINE 6.0 09/07/2016 1225   GLUCOSEU 50 (A) 09/07/2016 1225   HGBUR NEGATIVE 09/07/2016 [place] 09/07/2016 1225   BILIRUBINUR neg 08/24/2011 1746    KETONESUR NEGATIVE 09/07/2016 1225   PROTEINUR 30 (A) 09/07/2016 1225   UROBILINOGEN negative 08/24/2011 1746   UROBILINOGEN 0.2 09/30/2010 1626   NITRITE NEGATIVE 09/07/2016 1225   LEUKOCYTESUR NEGATIVE 09/07/2016 1225   Sepsis Labs Invalid input(s): PROCALCITONIN,  WBC,  LACTICIDVEN Microbiology No results found for this or any previous visit (from the past 240 hour(s)).   Time coordinating discharge: 40 minutes       SIGNED:   Edwin Dada, MD  [facility] 02/16/2018, 5:24 PM

## 2018-02-16 NOTE — Care Management Obs Status (Signed)
[place] NOTIFICATION   Patient Details  Name: YOMARIS PALECEK MRN: [id number] Date of Birth: [birth year]   Medicare Observation Status Notification Given:  Yes    Pollie Friar, RN 02/16/2018, 2:41 PM

## 2018-02-16 NOTE — Progress Notes (Signed)
STROKE TEAM PROGRESS NOTE   INTERVAL HISTORY Her husband and daughter are at the bedside.  Pt sitting in bed, no acute distress.   Stated that she had one episode of dizziness with feeling of passing out and difficulty with speech in 10 minutes, now has resolved.   Vitals:   02/16/18 0000 02/16/18 0128 02/16/18 0200 02/16/18 0500  BP: (!) 152/68  (!) 146/80 (!) 164/63  Pulse: 71  67 61  Resp:      Temp: 97.8 F (36.6 C)  97.8 F (36.6 C) 97.8 F (36.6 C)  TempSrc: Axillary  Axillary Axillary  SpO2: 100%  95% 96%  Weight:  61.9 kg    Height:        CBC:  Recent Labs  Lab 02/15/18 1611  WBC 6.4  NEUTROABS 3.1  HGB 13.0  HCT 35.7*  MCV 82.3  PLT 562    Basic Metabolic Panel:  Recent Labs  Lab 02/15/18 1611 02/15/18 1624  NA 127*  --   K 4.1  --   CL 95*  --   CO2 22  --   GLUCOSE 98  --   BUN 13  --   CREATININE 1.06* 1.10*  CALCIUM 9.5  --    Lipid Panel:     Component Value Date/Time   CHOL 185 02/16/2018 0521   TRIG 48 02/16/2018 0521   HDL 62 02/16/2018 0521   CHOLHDL 3.0 02/16/2018 0521   VLDL 10 02/16/2018 0521   LDLCALC 113 (H) 02/16/2018 0521   HgbA1c:  Lab Results  Component Value Date   HGBA1C 5.8 (H) 02/16/2018   Urine Drug Screen: No results found for: LABOPIA, COCAINSCRNUR, LABBENZ, AMPHETMU, THCU, LABBARB  Alcohol Level No results found for: [facility]  IMAGING Mr Brain Wo Contrast  Result Date: 02/16/2018 CLINICAL DATA:  85 y/o F; transient episode of slurred speech and dizziness. Stroke evaluation. EXAM: MRI HEAD WITHOUT CONTRAST MRA HEAD WITHOUT CONTRAST TECHNIQUE: Multiplanar, multiecho pulse sequences of the brain and surrounding structures were obtained without intravenous contrast. Angiographic images of the head were obtained using MRA technique without contrast. COMPARISON:  02/15/2018 CT head FINDINGS: MRI HEAD FINDINGS Brain: No acute infarction, hemorrhage, hydrocephalus, extra-axial collection or mass lesion. Very small chronic  infarctions within the right cerebellar hemisphere and the left caudate head. Punctate nonspecific T2 FLAIR hyperintensities in subcortical and periventricular white matter are compatible with mild chronic microvascular ischemic changes. Moderate volume loss of the brain. Vascular: As below. Skull and upper cervical spine: Normal marrow signal. Sinuses/Orbits: Negative. Other: Bilateral intra-ocular lens replacement. MRA HEAD FINDINGS Internal carotid arteries:  Patent. Anterior cerebral arteries: Patent. 2 mm posteriorly directed saccular aneurysm of left A-comm/A1 junction (series 7, image 113). Left A2/3 segment of moderate stenosis. Middle cerebral arteries: Patent. Anterior communicating artery: Patent. Posterior communicating arteries:  Patent. Posterior cerebral arteries:  Patent. Basilar artery:  Patent. Vertebral arteries:  Patent. No additional evidence of high-grade stenosis, large vessel occlusion, or aneurysm. IMPRESSION: MRI head: 1. No acute intracranial abnormality identified. 2. Mild chronic microvascular ischemic changes and moderate volume loss of the brain. Very small chronic infarcts within right cerebellar hemisphere and left caudate head. MRA head: 1. 2 mm left A-comm aneurysm. 2. Left A2/3 segment of moderate stenosis. 3. No additional aneurysm or stenosis.  No large vessel occlusion. Electronically Signed   By: Kristine Garbe M.D.   On: 02/16/2018 04:51   Mr Jodene Nam Head Wo Contrast  Result Date: 02/16/2018 CLINICAL DATA:  85 y/o F;  transient episode of slurred speech and dizziness. Stroke evaluation. EXAM: MRI HEAD WITHOUT CONTRAST MRA HEAD WITHOUT CONTRAST TECHNIQUE: Multiplanar, multiecho pulse sequences of the brain and surrounding structures were obtained without intravenous contrast. Angiographic images of the head were obtained using MRA technique without contrast. COMPARISON:  02/15/2018 CT head FINDINGS: MRI HEAD FINDINGS Brain: No acute infarction, hemorrhage,  hydrocephalus, extra-axial collection or mass lesion. Very small chronic infarctions within the right cerebellar hemisphere and the left caudate head. Punctate nonspecific T2 FLAIR hyperintensities in subcortical and periventricular white matter are compatible with mild chronic microvascular ischemic changes. Moderate volume loss of the brain. Vascular: As below. Skull and upper cervical spine: Normal marrow signal. Sinuses/Orbits: Negative. Other: Bilateral intra-ocular lens replacement. MRA HEAD FINDINGS Internal carotid arteries:  Patent. Anterior cerebral arteries: Patent. 2 mm posteriorly directed saccular aneurysm of left A-comm/A1 junction (series 7, image 113). Left A2/3 segment of moderate stenosis. Middle cerebral arteries: Patent. Anterior communicating artery: Patent. Posterior communicating arteries:  Patent. Posterior cerebral arteries:  Patent. Basilar artery:  Patent. Vertebral arteries:  Patent. No additional evidence of high-grade stenosis, large vessel occlusion, or aneurysm. IMPRESSION: MRI head: 1. No acute intracranial abnormality identified. 2. Mild chronic microvascular ischemic changes and moderate volume loss of the brain. Very small chronic infarcts within right cerebellar hemisphere and left caudate head. MRA head: 1. 2 mm left A-comm aneurysm. 2. Left A2/3 segment of moderate stenosis. 3. No additional aneurysm or stenosis.  No large vessel occlusion. Electronically Signed   By: Kristine Garbe M.D.   On: 02/16/2018 04:51   Ct Head Code Stroke Wo Contrast  Result Date: 02/15/2018 CLINICAL DATA:  Code stroke. Possible stroke. Slurred speech, aphasia EXAM: CT HEAD WITHOUT CONTRAST TECHNIQUE: Contiguous axial images were obtained from the base of the skull through the vertex without intravenous contrast. COMPARISON:  CT head 05/06/2008 FINDINGS: Brain: Generalized atrophy with progression from the prior study. Mild chronic white matter changes appear chronic Negative for acute  infarct, hemorrhage, or mass. Vascular: Negative for hyperdense vessel Skull: Negative Sinuses/Orbits: Paranasal sinuses clear. Bilateral cataract surgery. No orbital mass. Other: None ASPECTS ([place] Stroke Program Early CT Score) - Ganglionic level infarction (caudate, lentiform nuclei, internal capsule, insula, M1-M3 cortex): 7 - Supraganglionic infarction (M4-M6 cortex): 3 Total score (0-10 with 10 being normal): 10 IMPRESSION: 1. No acute intracranial abnormality 2. ASPECTS is 10 3. These results were called by telephone at the time of interpretation on 02/15/2018 at 4:27 pm to Dr. Leonel Ramsay , who verbally acknowledged these results. Electronically Signed   By: Franchot Gallo M.D.   On: 02/15/2018 16:27   Vas US Carotid (at [place] Only)  Result Date: 02/16/2018 Carotid Arterial Duplex Study Indications: CVA. Performing Technologist: Toma Copier RVS  Examination Guidelines: A complete evaluation includes B-mode imaging, spectral Doppler, color Doppler, and power Doppler as needed of all accessible portions of each vessel. Bilateral testing is considered an integral part of a complete examination. Limited examinations for reoccurring indications may be performed as noted.  Right Carotid Findings: +----------+--------+--------+--------+------------+-------------------------+           PSV cm/sEDV cm/sStenosisDescribe    Comments                  +----------+--------+--------+--------+------------+-------------------------+ CCA Prox  87      10                          mild intimal changes      +----------+--------+--------+--------+------------+-------------------------+  CCA Distal49      8                                                     +----------+--------+--------+--------+------------+-------------------------+ ICA Prox  57      9                           mild intimal changes      +----------+--------+--------+--------+------------+-------------------------+  ICA Mid   95      21                                                    +----------+--------+--------+--------+------------+-------------------------+ ICA Distal106     25                                                    +----------+--------+--------+--------+------------+-------------------------+ ECA       68      1               heterogenousmild plaque at the origin +----------+--------+--------+--------+------------+-------------------------+ +----------+--------+-------+--------+-------------------+           PSV cm/sEDV cmsDescribeArm Pressure (mmHG) +----------+--------+-------+--------+-------------------+ [id number]                                         +----------+--------+-------+--------+-------------------+ +---------+--------+--+--------+-+ VertebralPSV cm/s52EDV cm/s9 +---------+--------+--+--------+-+  Left Carotid Findings: +----------+--------+--------+--------+------------+-------------------------+           PSV cm/sEDV cm/sStenosisDescribe    Comments                  +----------+--------+--------+--------+------------+-------------------------+ CCA Prox  117     17                                                    +----------+--------+--------+--------+------------+-------------------------+ CCA Distal69      11                          mild intimal changes      +----------+--------+--------+--------+------------+-------------------------+ ICA Prox  80      13                          mild intimal changes      +----------+--------+--------+--------+------------+-------------------------+ ICA Mid   90      23                                                    +----------+--------+--------+--------+------------+-------------------------+ ICA Distal102     20                                                    +----------+--------+--------+--------+------------+-------------------------+  ECA       75      2                heterogenousmild plaque at the origin +----------+--------+--------+--------+------------+-------------------------+ +----------+--------+--------+--------+-------------------+ SubclavianPSV cm/sEDV cm/sDescribeArm Pressure (mmHG) +----------+--------+--------+--------+-------------------+           109                                         +----------+--------+--------+--------+-------------------+ +---------+--------+--+--------+--+ VertebralPSV cm/s51EDV cm/s11 +---------+--------+--+--------+--+  Summary: Right Carotid: There is no evidence of stenosis in the right ICA. Left Carotid: There is no evidence of stenosis in the left ICA. Vertebrals:  Bilateral vertebral arteries demonstrate antegrade flow. Subclavians: Normal flow hemodynamics were seen in bilateral subclavian              arteries. *See table(s) above for measurements and observations.     Preliminary     PHYSICAL EXAM  Temp:  [97.6 F (36.4 C)-97.9 F (36.6 C)] 97.9 F (36.6 C) (01/31 1539) Pulse Rate:  [61-73] 70 (01/31 1539) Resp:  [17-19] 17 (01/31 1539) BP: (146-164)/(63-86) 150/70 (01/31 1539) SpO2:  [95 %-100 %] 96 % (01/31 1539) Weight:  [61.9 kg] 61.9 kg (01/31 0128)  General - Well nourished, well developed, in no apparent distress.  Ophthalmologic - fundi not visualized due to noncooperation.  Cardiovascular - Regular rate and rhythm.  Mental Status -  Level of arousal and orientation to time, place, and person were intact. Language including expression, naming, repetition, comprehension was assessed and found intact. Fund of Knowledge was assessed and was intact.  Cranial Nerves II - XII - II - Visual field intact OU. III, IV, VI - Extraocular movements intact. V - Facial sensation intact bilaterally. VII - Facial movement intact bilaterally. VIII - Hearing & vestibular intact bilaterally. X - Palate elevates symmetrically. XI - Chin turning & shoulder shrug intact  bilaterally. XII - Tongue protrusion intact.  Motor Strength - The patient's strength was normal in all extremities and pronator drift was absent.  Bulk was normal and fasciculations were absent.   Motor Tone - Muscle tone was assessed at the neck and appendages and was normal.  Reflexes - The patient's reflexes were symmetrical in all extremities and she had no pathological reflexes.  Sensory - Light touch, temperature/pinprick were assessed and were symmetrical.    Coordination - The patient had normal movements in the hands with no ataxia or dysmetria.  Tremor was absent.  Gait and Station - deferred.    ASSESSMENT/PLAN Ms. [name] is a 85 y.o. female with history of HTN, GERD presenting with transient episode of difficulty speaking x 10 mins.   Possible TIA vs. Pre-syncope  Code Stroke CT head No acute stroke. ASPECTS 10.     MRI  No stroke. Small vessel disease. Atrophy. Old R cerebellar and L caudate head lacunes  MRA  60mm L Acom aneurysm. L A2/3 mod setnosis  Carotid Doppler  B ICA 1-39% stenosis, VAs antegrade   2D Echo EF more than 65%  LDL 113  HgbA1c 5.8  Lovenox 40 mg sq daily for VTE prophylaxis  No antithrombotic prior to admission, now on aspirin 325 mg daily. Decreased aspirin dose to 81. Continue at dc.  Therapy recommendations: Home PT OT  Disposition: Home  Hypertension  Stable . Long-term BP goal normotensive  Hyperlipidemia  Home meds:  No statin  LDL 113, goal < 70  Put on Lipitor 20  Continue statin at discharge  Other Stroke Risk Factors  Advanced age  Other Active Problems  Mild hyponatremia 127-138  Hospital day # 0  Neurology will sign off. Please call with questions. Pt will follow up with stroke clinic NP at [facility] in about 4 weeks. Thanks for the consult.   Burnetta Sabin, MSN, APRN, ANVP-BC, AGPCNP-BC Advanced Practice Stroke Nurse [place] for Schedule & Pager information 02/16/2018  10:38 AM   To contact Stroke Continuity provider, please refer to [email or web address] After hours, contact General Neurology

## 2018-02-27 ENCOUNTER — Telehealth: Payer: Self-pay | Admitting: Cardiology

## 2018-02-27 NOTE — Telephone Encounter (Signed)
Patient is calling about her CAROTID that is scheduled for tomorrow, patient was recently in the hospital and had one done about two weeks ago.  She is wondering is she still needs to come to tomorrow for this one.

## 2018-02-27 NOTE — Telephone Encounter (Signed)
Pt had carotid ultrasound 02/16/2018 in the hospital so appt previously scheduled here on 02/28/2018 has been cancelled. Pt to keep appt this Friday with Dr. Ellyn Hack.

## 2018-02-28 ENCOUNTER — Ambulatory Visit (HOSPITAL_COMMUNITY)
Admission: RE | Admit: 2018-02-28 | Payer: Medicare Other | Source: Ambulatory Visit | Attending: Cardiology | Admitting: Cardiology

## 2018-03-02 ENCOUNTER — Encounter: Payer: Self-pay | Admitting: Cardiology

## 2018-03-02 ENCOUNTER — Ambulatory Visit: Payer: Medicare Other | Admitting: Cardiology

## 2018-03-02 ENCOUNTER — Encounter (INDEPENDENT_AMBULATORY_CARE_PROVIDER_SITE_OTHER): Payer: Self-pay

## 2018-03-02 VITALS — BP 118/66 | HR 98 | Ht 63.0 in | Wt 135.6 lb

## 2018-03-02 DIAGNOSIS — R0609 Other forms of dyspnea: Secondary | ICD-10-CM

## 2018-03-02 DIAGNOSIS — Z8639 Personal history of other endocrine, nutritional and metabolic disease: Secondary | ICD-10-CM

## 2018-03-02 DIAGNOSIS — G459 Transient cerebral ischemic attack, unspecified: Secondary | ICD-10-CM | POA: Diagnosis not present

## 2018-03-02 DIAGNOSIS — I10 Essential (primary) hypertension: Secondary | ICD-10-CM

## 2018-03-02 DIAGNOSIS — R0989 Other specified symptoms and signs involving the circulatory and respiratory systems: Secondary | ICD-10-CM

## 2018-03-02 DIAGNOSIS — R0600 Dyspnea, unspecified: Secondary | ICD-10-CM

## 2018-03-02 DIAGNOSIS — I499 Cardiac arrhythmia, unspecified: Secondary | ICD-10-CM

## 2018-03-02 NOTE — Patient Instructions (Addendum)
Medication Instructions:  NOT NEEDED If you need a refill on your cardiac medications before your next appointment, please call your pharmacy.   Lab work: NOT NEEDED If you have labs (blood work) drawn today and your tests are completely normal, you will receive your results only by: Marland Kitchen MyChart Message (if you have MyChart) OR . A paper copy in the mail If you have any lab test that is abnormal or we need to change your treatment, we will call you to review the results.  Testing/Procedures: SCHEDULE AT [facility] has recommended that you wear a holter monitor 14 DAY ZIO PATCH. Holter monitors are medical devices that record the heart's electrical activity. Doctors most often use these monitors to diagnose arrhythmias. Arrhythmias are problems with the speed or rhythm of the heartbeat. The monitor is a small, portable device. You can wear one while you do your normal daily activities. This is usually used to diagnose what is causing palpitations/syncope (passing out). IF RESULT COME BACK ABNORMAL WILL SEE YOU SOONER   Follow-Up: At [facility], you and your health needs are our priority.  As part of our continuing mission to provide you with exceptional heart care, we have created designated Provider Care Teams.  These Care Teams include your primary Cardiologist (physician) and Advanced Practice Providers (APPs -  Physician Assistants and Nurse Practitioners) who all work together to provide you with the care you need, when you need it. You will need a follow up appointment in 6 months AUG 2020.  Please call our office 2 months in advance to schedule this appointment.  You may see Glenetta Hew, MD or one of the following Advanced Practice Providers on your designated Care Team:   Rosaria Ferries, PA-C . Jory Sims, DNP, ANP  Any Other Special Instructions Will Be Listed Below (If Applicable).

## 2018-03-02 NOTE — Progress Notes (Signed)
PCP: Lavone Orn, MD  Clinic Note: Chief Complaint  Patient presents with  . Follow-up    Irregular heartbeats, hypertension  . Hospitalization Follow-up    Recent TIA    HPI: [name] is a 85 y.o. female with borderline HTN & HLD who presents for annual f/u (but also as hospital f/u) - s/p TIA.   She is a former patient of Dr. Myrtice Lauth. Otherwise negative coronary history.  [name] was last seen in Feb 2019 - doing well.  Walks outside (so not during cold weather).  Some DOE up stairs carrying something.  Occasional "flip-flopping" sensation in chest.   Recent Hospitalizations:   Feb 15, 2018 - TIA (aphasia ~ 10 min). .    Studies Reviewed:   Echo 1/31//2020: EF > 65%. LV size small. Gr 1 DD (normal for age).   Normal atrial size. Moderate Aortic Sclerosis without Stenosis.  Carotid Dopplers 02/16/2018 - normal  Interval History: [name] presents today as an annual follow-up but also post hospital follow-up.  She is very stressed out and concerned about their TIA symptoms. Altogether, her symptoms did not last more than an hour or so and she has had complete resolution of symptoms.  She was discharged home on aspirin and increased dose of statin.  She has not had any further TIA or amaurosis fugax symptoms.  She has been scared to go back outside and do any walking, partially because the weather is been bad. She has had some dizziness and lightheadedness both before and after her TIA, but nothing significant now.  Blood pressure has been stable.  Has not had any sensation of rapid irregular heartbeats palpitations.  No syncope or near syncope. No PND, orthopnea or edema.  No resting dyspnea, but she has had some exertional dyspnea for instance when carrying something upstairs.  No chest pain or pressure with rest or exertion.  Tolerating the aspirin well without any bleeding issues.  No myalgias on statin.  ROS: A comprehensive was performed. Review of  Systems  Constitutional: Positive for weight loss (Subtle, she isn't eating much, but has been relatively stable). Negative for malaise/fatigue.  HENT: Negative for congestion.   Respiratory: Negative for cough, shortness of breath and wheezing.   Gastrointestinal: Positive for heartburn (occasional indigestion). Negative for abdominal pain and constipation.  Genitourinary: Negative for dysuria, frequency and urgency.  Musculoskeletal: Negative for falls.       Nighttime cramping  Neurological: Positive for dizziness (With bending over) and speech change (None since TIA). Negative for focal weakness, loss of consciousness and weakness.       TIA symptoms noted above in HPI  Psychiatric/Behavioral: Negative for depression and memory loss. The patient is not nervous/anxious and does not have insomnia.   All other systems reviewed and are negative.   Past Medical History:  Diagnosis Date  . Abnormal Pap smear 1975-76  . Anemia    history of  . Arthritis    spine  . Breast cyst, left 1980  . Cystocele 2012  . Diverticulitis   . Diverticulosis   . GERD (gastroesophageal reflux disease)   . H/O hypercholesterolemia   . H/O osteopenia   . H/O varicella   . H/O varicose veins   . Left leg swelling   . Macular degeneration   . Mitral valve prolapse   . Mumps   . Seasonal allergies   . Urge incontinence 2012  . Urinary frequency 2010    Past Surgical  History:  Procedure Laterality Date  . ABDOMINAL HYSTERECTOMY    . BLADDER SUSPENSION N/A 06/16/2016   Procedure: TRANSVAGINAL TAPE (TVT) PROCEDURE;  Surgeon: Everett Graff, MD;  Location: [facility];  Service: Gynecology;  Laterality: N/A;  . BREAST CYST ASPIRATION  1964  . COLONOSCOPY     Dr. Earlean Shawl  . CYSTOCELE REPAIR N/A 06/16/2016   Procedure: ANTERIOR REPAIR (CYSTOCELE);  Surgeon: Everett Graff, MD;  Location: [facility];  Service: Gynecology;  Laterality: N/A;  . CYSTOSCOPY N/A 06/16/2016   Procedure: CYSTOSCOPY;  Surgeon:  Everett Graff, MD;  Location: [facility];  Service: Gynecology;  Laterality: N/A;  see anterior repair  . Lower Extremity Venous Dopplers  01/09/2012   Right and left lower steroids: No evidence of thrombus or, thrombophlebitis; right and left GSV and SSV: No venous insufficiency. Normal exam.  . NM MYOVIEW LTD  11/2016   6.6 METS.  Reached 106% of max.  Heart rate.  Walk for 4:40 min.  EF 72%.  Normal blood pressure response.  upsloping ST segment depression, nonspecific.  Otherwise normal study.  LOW RISK.  No evidence of ischemia or infarction.  . TRANSTHORACIC ECHOCARDIOGRAM  01/2018   EF > 65%. LV size small. Gr 1 DD (normal for age).   Normal atrial size. Moderate Aortic Sclerosis without Stenosis.    Current Meds  Medication Sig  . acetaminophen (TYLENOL) 500 MG tablet Take 1,000 mg by mouth every 8 (eight) hours as needed for mild pain.  Marland Kitchen aspirin EC 81 MG tablet Take 1 tablet (81 mg total) by mouth daily.  Marland Kitchen atorvastatin (LIPITOR) 20 MG tablet Take 2 tablets (40 mg total) by mouth daily at 6 PM.  . carboxymethylcellulose (REFRESH PLUS) 0.5 % SOLN Place 2 drops into both eyes 3 (three) times daily as needed (for dry eyes).  . Lactobacillus Rhamnosus, GG, (CULTURELLE) CAPS Take 1 capsule by mouth every morning.   . Multiple Vitamin (MULTIVITAMIN WITH MINERALS) TABS Take 1 tablet by mouth daily.  . Multiple Vitamins-Minerals (HAIR VITAMINS) TABS Take 1 tablet by mouth 3 (three) times a week. Hair supplement called Hair Volume    Allergies  Allergen Reactions  . Ivp Dye [Iodinated Diagnostic Agents] Hives  . Myrbetriq [Mirabegron] Other (See Comments)    "Makes me urinate more frequently"    Social History   Tobacco Use  . Smoking status: Never Smoker  . Smokeless tobacco: Never Used  Substance Use Topics  . Alcohol use: No  . Drug use: No   Social History   Social History Narrative   She is a married mother of 26, grandmother of 70, great grandmother of 25. She walks   daily  about 2 miles a day, does not smoke, does not drink.      family history includes Heart attack (age of onset: 68) in her brother; Uterine cancer (age of onset: 89) in her mother.  Wt Readings from Last 3 Encounters:  03/02/18 135 lb 9.6 oz (61.5 kg)  02/16/18 136 lb 7.4 oz (61.9 kg)  02/28/17 139 lb (63 kg)    PHYSICAL EXAM BP 118/66   Pulse 98   Ht 5\' 3"  (1.6 m)   Wt 135 lb 9.6 oz (61.5 kg)   SpO2 97%   BMI 24.02 kg/m   Physical Exam  Constitutional: She is oriented to person, place, and time. She appears well-developed and well-nourished. No distress.  HENT:  Head: Normocephalic and atraumatic.  Left Ear: External ear normal.  Eyes: EOM are normal.  Neck: Neck supple. No hepatojugular reflux and no JVD present. Carotid bruit is present (Possible soft right carotid bruit).  Cardiovascular: Normal rate, regular rhythm, S1 normal, S2 normal, intact distal pulses and normal pulses.  No extrasystoles are present. PMI is not displaced. Exam reveals no gallop and no friction rub.  Murmur heard.  Medium-pitched harsh early systolic murmur is present with a grade of 1/6 at the upper right sternal border radiating to the neck. Pulmonary/Chest: Effort normal and breath sounds normal. No respiratory distress. She has no wheezes.  Abdominal: Soft. Bowel sounds are normal. She exhibits no distension. There is no abdominal tenderness.  Musculoskeletal: Normal range of motion.        General: No edema.  Neurological: She is alert and oriented to person, place, and time. No cranial nerve deficit.  Psychiatric: She has a normal mood and affect. Her behavior is normal. Judgment and thought content normal.  Vitals reviewed.    Adult ECG Report Not checked  Other studies Reviewed: Additional studies/ records that were reviewed today include:  Recent Labs: No recent labs available Lab Results  Component Value Date   CREATININE 1.00 02/16/2018   BUN 12 02/16/2018   NA 138 02/16/2018   K  4.1 02/16/2018   CL 104 02/16/2018   CO2 25 02/16/2018     Lab Results  Component Value Date   CHOL 185 02/16/2018   HDL 62 02/16/2018   LDLCALC 113 (H) 02/16/2018   TRIG 48 02/16/2018   CHOLHDL 3.0 02/16/2018    ASSESSMENT / PLAN: Problem List Items Addressed This Visit    Essential hypertension; borderline (Chronic)    Stable blood pressure.  Still on no medications.  Follow closely.      Exertional dyspnea    Relatively stable.  EF is normal with no wall motion normalities.  Valve is relatively normal.  I suspect this is probably just a little deconditioning      H/O hypercholesterolemia (Chronic)   Irregular heartbeat    Pretty much asymptomatic.  Has not had any issues.  Avoiding medications.      Relevant Orders   LONG TERM MONITOR (3-14 DAYS)   Right carotid bruit    Had carotid Dopplers done because of her TIA and they were relatively normal.  No sign of right carotid stenosis.  Probably has tortuous vessels.      TIA (transient ischemic attack) - Primary    Recent TIA.  Symptoms resolved. Now on aspirin and increased dose of statin. Echocardiogram relatively reassuring. -No clear-cut cardiac etiology visible.  However bubble study was not done.  Need to exclude A. fib as a potential etiology.  Will do event monitor (ZIO patch).  If no evidence of A. fib, will simply hold off on further therapy.  If there is another episode, would consider loop recorder.      Relevant Orders   LONG TERM MONITOR (3-14 DAYS)     Current medicines are reviewed at length with the patient today. (+/- concerns) none The following changes have been made: None  Patient Instructions  Medication Instructions:  NOT NEEDED If you need a refill on your cardiac medications before your next appointment, please call your pharmacy.   Lab work: NOT NEEDED If you have labs (blood work) drawn today and your tests are completely normal, you will receive your results only by: Marland Kitchen MyChart  Message (if you have MyChart) OR . A paper copy in the mail If you have any lab test  that is abnormal or we need to change your treatment, we will call you to review the results.  Testing/Procedures: SCHEDULE AT [place] has recommended that you wear a holter monitor 14 DAY ZIO PATCH. Holter monitors are medical devices that record the heart's electrical activity. Doctors most often use these monitors to diagnose arrhythmias. Arrhythmias are problems with the speed or rhythm of the heartbeat. The monitor is a small, portable device. You can wear one while you do your normal daily activities. This is usually used to diagnose what is causing palpitations/syncope (passing out). IF RESULT COME BACK ABNORMAL WILL SEE YOU SOONER   Follow-Up: At [facility], you and your health needs are our priority.  As part of our continuing mission to provide you with exceptional heart care, we have created designated Provider Care Teams.  These Care Teams include your primary Cardiologist (physician) and Advanced Practice Providers (APPs -  Physician Assistants and Nurse Practitioners) who all work together to provide you with the care you need, when you need it. You will need a follow up appointment in 6 months AUG 2020.  Please call our office 2 months in advance to schedule this appointment.  You may see Glenetta Hew, MD or one of the following Advanced Practice Providers on your designated Care Team:   Rosaria Ferries, PA-C . Jory Sims, DNP, ANP  Any Other Special Instructions Will Be Listed Below (If Applicable).    Studies Ordered:   Orders Placed This Encounter  Procedures  . LONG TERM MONITOR (3-14 DAYS)      Glenetta Hew, M.D., M.S. Interventional Cardiologist   Pager # [phone] Phone # [phone] [address] [place], [place] [address]

## 2018-03-04 ENCOUNTER — Encounter: Payer: Self-pay | Admitting: Cardiology

## 2018-03-04 NOTE — Assessment & Plan Note (Signed)
Had carotid Dopplers done because of her TIA and they were relatively normal.  No sign of right carotid stenosis.  Probably has tortuous vessels.

## 2018-03-04 NOTE — Assessment & Plan Note (Signed)
Relatively stable.  EF is normal with no wall motion normalities.  Valve is relatively normal.  I suspect this is probably just a little deconditioning

## 2018-03-04 NOTE — Assessment & Plan Note (Signed)
Recent TIA.  Symptoms resolved. Now on aspirin and increased dose of statin. Echocardiogram relatively reassuring. -No clear-cut cardiac etiology visible.  However bubble study was not done.  Need to exclude A. fib as a potential etiology.  Will do event monitor (ZIO patch).  If no evidence of A. fib, will simply hold off on further therapy.  If there is another episode, would consider loop recorder.

## 2018-03-04 NOTE — Assessment & Plan Note (Signed)
Stable blood pressure.  Still on no medications.  Follow closely.

## 2018-03-04 NOTE — Assessment & Plan Note (Signed)
Pretty much asymptomatic.  Has not had any issues.  Avoiding medications.

## 2018-03-15 ENCOUNTER — Ambulatory Visit (INDEPENDENT_AMBULATORY_CARE_PROVIDER_SITE_OTHER): Payer: Medicare Other

## 2018-03-15 DIAGNOSIS — I499 Cardiac arrhythmia, unspecified: Secondary | ICD-10-CM | POA: Diagnosis not present

## 2018-03-15 DIAGNOSIS — G459 Transient cerebral ischemic attack, unspecified: Secondary | ICD-10-CM | POA: Diagnosis not present

## 2018-03-27 ENCOUNTER — Other Ambulatory Visit: Payer: Medicare Other

## 2018-04-05 ENCOUNTER — Other Ambulatory Visit: Payer: Medicare Other

## 2018-05-29 ENCOUNTER — Telehealth: Payer: Self-pay | Admitting: Cardiology

## 2018-05-29 NOTE — Telephone Encounter (Signed)
Mychart pending, smartphone, consent, pre reg complete 05/29/18 AF

## 2018-05-29 NOTE — Telephone Encounter (Signed)
Due to the recent COVID-19 pandemic, we are transitioning in-person office visits to tele-medicine visits in an effort to decrease unnecessary exposure to our patients, their families, and staff. These visits are billed to your insurance just like a normal visit is. We also encourage you to sign up for MyChart if you have not already done so. You will need a smartphone if possible. For patients that do not have this, we can still complete the visit using a regular telephone but do prefer a smartphone to enable video when possible. You may have a family member that lives with you that can help. If possible, we also ask that you have a blood pressure cuff and scale at home to measure your blood pressure, heart rate and weight prior to your scheduled appointment. Patients with clinical needs that need an in-person evaluation and testing will still be able to come to the office if absolutely necessary. If you have any questions, feel free to call our office.    YOUR PROVIDER WILL BE USING DOXIMITY or [email or web address] - The staff will give you instructions on receiving your link to join the meeting the day of your visit.    THE DAY OF YOUR APPOINTMENT  Approximately 15 minutes prior to your scheduled appointment, you will receive a telephone call from one of [facility] team - your caller ID may say "Unknown caller."  Our staff will confirm medications, vital signs for the day and any symptoms you may be experiencing. Please have this information available prior to the time of visit start. It may also be helpful for you to have a pad of paper and pen handy for any instructions given during your visit. They will also walk you through joining the smartphone meeting if this is a video visit.    CONSENT FOR TELE-HEALTH VISIT - PLEASE REVIEW  I hereby voluntarily request, consent and authorize [facility] and its employed or contracted physicians, physician assistants, nurse practitioners or other licensed health care  professionals (the Practitioner), to provide me with telemedicine health care services (the "Services") as deemed necessary by the treating Practitioner. I acknowledge and consent to receive the Services by the Practitioner via telemedicine. I understand that the telemedicine visit will involve communicating with the Practitioner through live audiovisual communication technology and the disclosure of certain medical information by electronic transmission. I acknowledge that I have been given the opportunity to request an in-person assessment or other available alternative prior to the telemedicine visit and am voluntarily participating in the telemedicine visit.   I understand that I have the right to withhold or withdraw my consent to the use of telemedicine in the course of my care at any time, without affecting my right to future care or treatment, and that the Practitioner or I may terminate the telemedicine visit at any time. I understand that I have the right to inspect all information obtained and/or recorded in the course of the telemedicine visit and may receive copies of available information for a reasonable fee.  I understand that some of the potential risks of receiving the Services via telemedicine include:   Delay or interruption in medical evaluation due to technological equipment failure or disruption;  Information transmitted may not be sufficient (e.g. poor resolution of images) to allow for appropriate medical decision making by the Practitioner; and/or  In rare instances, security protocols could fail, causing a breach of personal health information.   Furthermore, I acknowledge that it is my responsibility to provide information about my   medical history, conditions and care that is complete and accurate to the best of my ability. I acknowledge that Practitioner's advice, recommendations, and/or decision may be based on factors not within their control, such as incomplete or  inaccurate data provided by me or distortions of diagnostic images or specimens that may result from electronic transmissions. I understand that the practice of medicine is not an exact science and that Practitioner makes no warranties or guarantees regarding treatment outcomes. I acknowledge that I will receive a copy of this consent concurrently upon execution via email to the email address I last provided but may also request a printed copy by calling the office of [facility].     I understand that my insurance will be billed for this visit.   I have read or had this consent read to me.  I understand the contents of this consent, which adequately explains the benefits and risks of the Services being provided via telemedicine.  I have been provided ample opportunity to ask questions regarding this consent and the Services and have had my questions answered to my satisfaction.  I give my informed consent for the services to be provided through the use of telemedicine in my medical care   By participating in this telemedicine visit I agree to the above.  

## 2018-05-30 ENCOUNTER — Telehealth (INDEPENDENT_AMBULATORY_CARE_PROVIDER_SITE_OTHER): Payer: Medicare Other | Admitting: Cardiology

## 2018-05-30 ENCOUNTER — Encounter: Payer: Self-pay | Admitting: Cardiology

## 2018-05-30 VITALS — BP 150/81 | HR 71 | Ht 63.0 in | Wt 135.0 lb

## 2018-05-30 DIAGNOSIS — R002 Palpitations: Secondary | ICD-10-CM | POA: Insufficient documentation

## 2018-05-30 DIAGNOSIS — I10 Essential (primary) hypertension: Secondary | ICD-10-CM | POA: Diagnosis not present

## 2018-05-30 DIAGNOSIS — R2689 Other abnormalities of gait and mobility: Secondary | ICD-10-CM

## 2018-05-30 DIAGNOSIS — G459 Transient cerebral ischemic attack, unspecified: Secondary | ICD-10-CM

## 2018-05-30 NOTE — Assessment & Plan Note (Signed)
Was prescribed meclizine by PCP.  That sounds reasonable.  I do not think the symptoms she is having right now is cardiac in nature given the absence of other cardiac symptoms.  We also the fact that her heart rate is normal right now it is unlikely this is arrhythmogenic.  Blood pressure 150/90 should not cause dizziness.  She also mention that earlier today the pressure was normal and she was still dizzy.  Will defer to PCP.  Unusual because of recent MRI being relatively normal, carotid Dopplers being normal, echo being normal and monitor that is noncontributory.

## 2018-05-30 NOTE — Assessment & Plan Note (Signed)
Intact, monitor did show episodes of PAT/PSVT. She has not had any prolonged episodes nothing worrisome for what would sound like A. fib.  Heart rate currently by her exam is normal and bounding.  At this point unless she has more TIA type symptoms or prolonged episodes of irregular heartbeats, I probably would avoid loop recorder.

## 2018-05-30 NOTE — Assessment & Plan Note (Signed)
Blood pressure is definitely high today, but she is very upset and worried about her dizziness poor balance.  I think this is more of an effect as opposed to the cause of her symptoms.  Based on the fact she is been pretty well controlled leading up to this, I would be reluctant to treat blood pressure, in order to avoid worsening dizziness.

## 2018-05-30 NOTE — Progress Notes (Signed)
Virtual Visit via Telephone Note   This visit type was conducted due to national recommendations for restrictions regarding the COVID-19 Pandemic (e.g. social distancing) in an effort to limit this patient's exposure and mitigate transmission in our community.  Due to her co-morbid illnesses, this patient is at least at moderate risk for complications without adequate follow up.  This format is felt to be most appropriate for this patient at this time.  The patient did not have access to video technology/had technical difficulties with video requiring transitioning to audio format only (telephone).  All issues noted in this document were discussed and addressed.  No physical exam could be performed with this format.  Please refer to the patient's chart for her  consent to telehealth for [facility].   We attempted to establish connection for video call, however she was unsure of how to manipulate the length.  We therefore converted to telephone call, to avoid further confusion and agitation.  Patient has given verbal permission to conduct this visit via virtual appointment and to bill insurance 05/30/2018 3:45 PM     Evaluation Performed:  Follow-up visit  Date:  05/30/2018   ID:  [name], [name] [birth year], MRN [id number]  Patient Location: Home Provider Location: Home  PCP:  Lavone Orn, MD  Cardiologist:  Glenetta Hew, MD   Electrophysiologist:  None   Chief Complaint: Dizziness; follow-up monitor results  History of Present Illness:    [name] is a 85 y.o. female with PMH notable for hypertension hyperlipidemia with recent TIA who presents via audio/video conferencing for a telehealth visit today.  [name] was last seen In February 2020 for hospital follow-up after TIA.  Her symptoms lasted less than an hour and had complete resolution of symptoms. Her echocardiogram was relatively normal.  We checked a 14-day monitor.  Interval History:  Manasvi is being seen  today to discuss results of her monitor.  Since Monday - not feeling well.  Feels dizzy, HA - just not well.   No real palpitations.  Unsteady gait. Has been drinking plenty & eating OK. Nausea without vomiting.  She does feel occasional fluttering but nothing prolonged nothing lasting any more than a few seconds.  Although she is feeling dizzy and having some episodes of the room spinning, she has not had any syncope or near syncope and no further TIA or amaurosis fugax symptoms.  Cardiovascular ROS: no chest pain or dyspnea on exertion positive for - irregular heartbeat, palpitations and Dizziness and lightheadedness, poor balance negative for - chest pain, edema, loss of consciousness, orthopnea, paroxysmal nocturnal dyspnea, rapid heart rate, shortness of breath or Near syncope  The patient does not have symptoms concerning for COVID-19 infection (fever, chills, cough, or new shortness of breath).  The patient is practicing social distancing.  Daughter gets groceries.   ROS:  Please see the history of present illness.    Review of Systems  Constitutional: Negative for chills, fever, malaise/fatigue and weight loss.  HENT: Negative for congestion, nosebleeds and sinus pain.   Respiratory: Negative for cough, shortness of breath and wheezing.   Gastrointestinal: Positive for nausea. Negative for blood in stool, heartburn, melena and vomiting.  Genitourinary: Negative for hematuria.  Musculoskeletal: Negative for falls.  Neurological: Positive for dizziness and headaches. Negative for sensory change, speech change, focal weakness, loss of consciousness and weakness.       Poor balance, unsteady gait  Psychiatric/Behavioral: The patient is nervous/anxious (Is anxious about how  she is feeling now.).   All other systems reviewed and are negative.   Past Medical History:  Diagnosis Date  . Abnormal Pap smear 1975-76  . Anemia    history of  . Arthritis    spine  . Breast cyst, left  1980  . Cystocele 2012  . Diverticulosis    with h/o Diverticulitis  . GERD (gastroesophageal reflux disease)   . H/O hypercholesterolemia   . H/O osteopenia   . H/O varicella   . H/O varicose veins   . Hx of Mumps   . Macular degeneration   . Seasonal allergies   . Urge incontinence 2012  . Urinary frequency 2010   Past Surgical History:  Procedure Laterality Date  . ABDOMINAL HYSTERECTOMY    . BLADDER SUSPENSION N/A 06/16/2016   Procedure: TRANSVAGINAL TAPE (TVT) PROCEDURE;  Surgeon: Everett Graff, MD;  Location: [facility];  Service: Gynecology;  Laterality: N/A;  . BREAST CYST ASPIRATION  1964  . COLONOSCOPY     Dr. Earlean Shawl  . CYSTOCELE REPAIR N/A 06/16/2016   Procedure: ANTERIOR REPAIR (CYSTOCELE);  Surgeon: Everett Graff, MD;  Location: [facility];  Service: Gynecology;  Laterality: N/A;  . CYSTOSCOPY N/A 06/16/2016   Procedure: CYSTOSCOPY;  Surgeon: Everett Graff, MD;  Location: [facility];  Service: Gynecology;  Laterality: N/A;  see anterior repair  . Lower Extremity Venous Dopplers  01/09/2012   Right and left lower steroids: No evidence of thrombus or, thrombophlebitis; right and left GSV and SSV: No venous insufficiency. Normal exam.  . NM MYOVIEW LTD  11/2016   6.6 METS.  Reached 106% of max.  Heart rate.  Walk for 4:40 min.  EF 72%.  Normal blood pressure response.  upsloping ST segment depression, nonspecific.  Otherwise normal study.  LOW RISK.  No evidence of ischemia or infarction.  . TRANSTHORACIC ECHOCARDIOGRAM  01/2018   EF > 65%. LV size small. Gr 1 DD (normal for age).   Normal atrial size. Moderate Aortic Sclerosis without Stenosis. Moderate Mitral Annular Calcification with mild MR & no MS     Current Meds  Medication Sig  . acetaminophen (TYLENOL) 500 MG tablet Take 1,000 mg by mouth every 8 (eight) hours as needed for mild pain.  Marland Kitchen aspirin EC 81 MG tablet Take 1 tablet (81 mg total) by mouth daily.  . carboxymethylcellulose (REFRESH PLUS) 0.5 % SOLN Place 2  drops into both eyes 3 (three) times daily as needed (for dry eyes).  . cholecalciferol (VITAMIN D) 1000 units tablet Take 1,000 Units by mouth daily.  . Cyanocobalamin (VITAMIN B-12) 3000 MCG SUBL Place 3,000 mcg under the tongue 3 (three) times a week.  . Lactobacillus Rhamnosus, GG, (CULTURELLE) CAPS Take 1 capsule by mouth every morning.   . Magnesium 250 MG TABS Take 250 mg by mouth daily as needed (leg cramps).  . meclizine (ANTIVERT) 25 MG tablet Take 25 mg by mouth daily as needed for dizziness.  . Multiple Vitamin (MULTIVITAMIN WITH MINERALS) TABS Take 1 tablet by mouth daily.  . Multiple Vitamins-Minerals (HAIR VITAMINS) TABS Take 1 tablet by mouth 3 (three) times a week. Hair supplement called Hair Volume  . pravastatin (PRAVACHOL) 20 MG tablet Take 20 mg by mouth daily.     Allergies:   Ivp dye [iodinated diagnostic agents] and Myrbetriq [mirabegron]   Social History   Tobacco Use  . Smoking status: Never Smoker  . Smokeless tobacco: Never Used  Substance Use Topics  . Alcohol use: No  .  Drug use: No     Family Hx: The patient's family history includes Heart attack (age of onset: 46) in her brother; Uterine cancer (age of onset: 55) in her mother.   Prior CV studies:   The following studies were reviewed today: . 14-day ZIO patch monitor:   Predominant underlying rhythm sinus rhythm. Minimum heart rate 51 bpm. Average heart rate 88 bpm.. Max sinus tachycardia 151 Bpm.  1 4-5 beat run of NSVT with max rate 184 bpm  21 episodes of SVT --> longest lasted 15 beats average rate 153 Bpm  Max recorded heart rate -4 beat run of PAT/SVT (203 bpm)  Rare PACs and PVCs.  - consider ILR  Labs/Other Tests and Data Reviewed:    EKG:  No ECG reviewed.  Recent Labs: 02/15/2018: ALT 16; Hemoglobin 13.0; Platelets 218 02/16/2018: BUN 12; Creatinine, Ser 1.00; Potassium 4.1; Sodium 138; TSH 2.427   Recent Lipid Panel Lab Results  Component Value Date/Time   CHOL 185  02/16/2018 05:21 AM   TRIG 48 02/16/2018 05:21 AM   HDL 62 02/16/2018 05:21 AM   CHOLHDL 3.0 02/16/2018 05:21 AM   LDLCALC 113 (H) 02/16/2018 05:21 AM    Wt Readings from Last 3 Encounters:  05/30/18 135 lb (61.2 kg)  03/02/18 135 lb 9.6 oz (61.5 kg)  02/16/18 136 lb 7.4 oz (61.9 kg)     Objective:    Vital Signs:  BP (!) 150/81   Pulse 71   Ht 5\' 3"  (1.6 m)   Wt 135 lb (61.2 kg)   BMI 23.91 kg/m   VITAL SIGNS:  reviewed RESPIRATORY:  non-labored NEURO:  A&Ox 3  PSYCH:  worried.  Normal affect   ASSESSMENT & PLAN:    Problem List Items Addressed This Visit    Essential hypertension; borderline - Primary (Chronic)    Blood pressure is definitely high today, but she is very upset and worried about her dizziness poor balance.  I think this is more of an effect as opposed to the cause of her symptoms.  Based on the fact she is been pretty well controlled leading up to this, I would be reluctant to treat blood pressure, in order to avoid worsening dizziness.      Relevant Medications   pravastatin (PRAVACHOL) 20 MG tablet   Palpitations (Chronic)    Intact, monitor did show episodes of PAT/PSVT. She has not had any prolonged episodes nothing worrisome for what would sound like A. fib.  Heart rate currently by her exam is normal and bounding.  At this point unless she has more TIA type symptoms or prolonged episodes of irregular heartbeats, I probably would avoid loop recorder.      Poor balance    Was prescribed meclizine by PCP.  That sounds reasonable.  I do not think the symptoms she is having right now is cardiac in nature given the absence of other cardiac symptoms.  We also the fact that her heart rate is normal right now it is unlikely this is arrhythmogenic.  Blood pressure 150/90 should not cause dizziness.  She also mention that earlier today the pressure was normal and she was still dizzy.  Will defer to PCP.  Unusual because of recent MRI being relatively  normal, carotid Dopplers being normal, echo being normal and monitor that is noncontributory.      TIA (transient ischemic attack) (Chronic)    I do not think that TIA was related to A. fib.  She does have lots  of PACs and PAT but harbingers of A. fib, but there is nothing to suggest that is been A. fib.  I would be hesitant to start anticoagulation on 85 year old without documented A. fib.  If she were to have recurrent episodes, I think it would be reasonable to place a loop recorder.      Relevant Medications   pravastatin (PRAVACHOL) 20 MG tablet      COVID-19 Education: The signs and symptoms of COVID-19 were discussed with the patient and how to seek care for testing (follow up with PCP or arrange E-visit).   The importance of social distancing was discussed today.  Time:   Today, I have spent 26 minutes with the patient with telehealth technology discussing the above problems.     Medication Adjustments/Labs and Tests Ordered: Current medicines are reviewed at length with the patient today.  Concerns regarding medicines are outlined above.  Medication Instructions:    I do think he should be okay taking the meclizine prescribed by PCP.  Tests Ordered: No orders of the defined types were placed in this encounter. None  Medication Changes: No orders of the defined types were placed in this encounter. None  Disposition:  Follow up in 6 month(s)    Signed, Glenetta Hew, MD  05/30/2018 3:45 PM    [facility]

## 2018-05-30 NOTE — Patient Instructions (Addendum)
  Medication Instructions:    I do think he should be okay taking the meclizine prescribed by PCP.   Medication Instructions:  No changes   If you need a refill on your cardiac medications before your next appointment, please call your pharmacy.   Lab work: Not needed  Testing/Procedures: Not needed  Follow-Up: At [facility], you and your health needs are our priority.  As part of our continuing mission to provide you with exceptional heart care, we have created designated Provider Care Teams.  These Care Teams include your primary Cardiologist (physician) and Advanced Practice Providers (APPs -  Physician Assistants and Nurse Practitioners) who all work together to provide you with the care you need, when you need it. . You will need a follow up appointment in  6 months Nov 2020.  Please call our office 2 months in advance to schedule this appointment.  You may see Glenetta Hew, MD or one of the following Advanced Practice Providers on your designated Care Team:   . Rosaria Ferries, PA-C . Jory Sims, DNP, ANP  Any Other Special Instructions Will Be Listed Below (If Applicable).

## 2018-05-30 NOTE — Assessment & Plan Note (Signed)
I do not think that TIA was related to A. fib.  She does have lots of PACs and PAT but harbingers of A. fib, but there is nothing to suggest that is been A. fib.  I would be hesitant to start anticoagulation on 85 year old without documented A. fib.  If she were to have recurrent episodes, I think it would be reasonable to place a loop recorder.

## 2018-05-31 ENCOUNTER — Encounter (HOSPITAL_COMMUNITY): Payer: Self-pay | Admitting: Emergency Medicine

## 2018-05-31 ENCOUNTER — Emergency Department (HOSPITAL_COMMUNITY)
Admission: EM | Admit: 2018-05-31 | Discharge: 2018-05-31 | Disposition: A | Discharge status: Home / Self Care | Payer: Medicare Other | Attending: Emergency Medicine | Admitting: Emergency Medicine

## 2018-05-31 ENCOUNTER — Emergency Department (HOSPITAL_COMMUNITY): Payer: Medicare Other

## 2018-05-31 ENCOUNTER — Other Ambulatory Visit: Payer: Self-pay

## 2018-05-31 DIAGNOSIS — Y92017 Garden or yard in single-family (private) house as the place of occurrence of the external cause: Secondary | ICD-10-CM | POA: Diagnosis not present

## 2018-05-31 DIAGNOSIS — S0990XA Unspecified injury of head, initial encounter: Secondary | ICD-10-CM | POA: Diagnosis present

## 2018-05-31 DIAGNOSIS — W19XXXA Unspecified fall, initial encounter: Secondary | ICD-10-CM | POA: Insufficient documentation

## 2018-05-31 DIAGNOSIS — M545 Low back pain: Secondary | ICD-10-CM | POA: Diagnosis not present

## 2018-05-31 DIAGNOSIS — Y998 Other external cause status: Secondary | ICD-10-CM | POA: Insufficient documentation

## 2018-05-31 DIAGNOSIS — R42 Dizziness and giddiness: Secondary | ICD-10-CM | POA: Insufficient documentation

## 2018-05-31 DIAGNOSIS — Y9389 Activity, other specified: Secondary | ICD-10-CM | POA: Diagnosis not present

## 2018-05-31 DIAGNOSIS — I10 Essential (primary) hypertension: Secondary | ICD-10-CM | POA: Insufficient documentation

## 2018-05-31 DIAGNOSIS — Z7982 Long term (current) use of aspirin: Secondary | ICD-10-CM | POA: Insufficient documentation

## 2018-05-31 DIAGNOSIS — Z79899 Other long term (current) drug therapy: Secondary | ICD-10-CM | POA: Insufficient documentation

## 2018-05-31 DIAGNOSIS — M542 Cervicalgia: Secondary | ICD-10-CM | POA: Diagnosis not present

## 2018-05-31 DIAGNOSIS — Z8673 Personal history of transient ischemic attack (TIA), and cerebral infarction without residual deficits: Secondary | ICD-10-CM | POA: Diagnosis not present

## 2018-05-31 LAB — URINALYSIS, ROUTINE W REFLEX MICROSCOPIC
Bilirubin Urine: NEGATIVE
Glucose, UA: NEGATIVE mg/dL
Hgb urine dipstick: NEGATIVE
Ketones, ur: NEGATIVE mg/dL
Leukocytes,Ua: NEGATIVE
Nitrite: NEGATIVE
Protein, ur: NEGATIVE mg/dL
Specific Gravity, Urine: 1.005 (ref 1.005–1.030)
pH: 6 (ref 5.0–8.0)

## 2018-05-31 LAB — BASIC METABOLIC PANEL
Anion gap: 9 (ref 5–15)
BUN: 15 mg/dL (ref 8–23)
CO2: 25 mmol/L (ref 22–32)
Calcium: 9.4 mg/dL (ref 8.9–10.3)
Chloride: 105 mmol/L (ref 98–111)
Creatinine, Ser: 1.11 mg/dL — ABNORMAL HIGH (ref 0.44–1.00)
GFR calc Af Amer: 51 mL/min — ABNORMAL LOW (ref >60)
GFR calc non Af Amer: 44 mL/min — ABNORMAL LOW (ref >60)
Glucose, Bld: 133 mg/dL — ABNORMAL HIGH (ref 70–99)
Potassium: 4.1 mmol/L (ref 3.5–5.1)
Sodium: 139 mmol/L (ref 135–145)

## 2018-05-31 LAB — CBC
HCT: 34 % — ABNORMAL LOW (ref 36.0–46.0)
Hemoglobin: 12.1 g/dL (ref 12.0–15.0)
MCH: 30.5 pg (ref 26.0–34.0)
MCHC: 35.6 g/dL (ref 30.0–36.0)
MCV: 85.6 fL (ref 80.0–100.0)
Platelets: 178 10*3/uL (ref 150–400)
RBC: 3.97 MIL/uL (ref 3.87–5.11)
RDW: 13.3 % (ref 11.5–15.5)
WBC: 6.3 10*3/uL (ref 4.0–10.5)
nRBC: 0 % (ref 0.0–0.2)

## 2018-05-31 LAB — CBG MONITORING, ED: Glucose-Capillary: 136 mg/dL — ABNORMAL HIGH (ref 70–99)

## 2018-05-31 MED ORDER — SODIUM CHLORIDE 0.9% FLUSH: 3.0000 mL | Freq: Once | INTRAVENOUS | Status: DC

## 2018-05-31 MED ORDER — MECLIZINE HCL 25 MG PO TABS: 25.0000 mg | ORAL_TABLET | Freq: Two times a day (BID) | ORAL | 0 refills | Status: DC | PRN

## 2018-05-31 NOTE — ED Notes (Signed)
This Rn rounded on pt and asked if I could call any family/friends for her. Pt declined at this time. Rn told pt that if she changes her mind, I will be happy to call her family.

## 2018-05-31 NOTE — ED Notes (Signed)
Updated patient's son on pt's plan of care

## 2018-05-31 NOTE — ED Provider Notes (Signed)
[facility] EMERGENCY DEPARTMENT Provider Note   CSN: [id number] Arrival date & time: 05/31/18  1309    History   Chief Complaint Chief Complaint  Patient presents with   Fall   Loss of Consciousness   Back Pain    HPI [name] is a 85 y.o. female.     HPI Patient states she was in her normal state of health when she went outside this morning around 11:00.  States she bent down to pick something up and when she stood up she became dizzy and fell backwards.  States she struck the back of her head and thinks she had a brief loss of consciousness.  Difficulty getting up from the ground.  She did not want to be transported to the emergency department, has the delaying care.  She complains of dizziness when she stands which she scribed as feeling off balance.  Denies any nausea or vomiting.  Denies visual or speech changes.  No focal weakness or numbness.  She denies chest pain or shortness of breath.  Patient does complain of some posterior neck pain since the fall and low back pain. Past Medical History:  Diagnosis Date   Abnormal Pap smear 1975-76   Anemia    history of   Arthritis    spine   Breast cyst, left 1980   Cystocele 2012   Diverticulosis    with h/o Diverticulitis   GERD (gastroesophageal reflux disease)    H/O hypercholesterolemia    H/O osteopenia    H/O varicella    H/O varicose veins    Hx of Mumps    Macular degeneration    Seasonal allergies    Urge incontinence 2012   Urinary frequency 2010    Patient Active Problem List   Diagnosis Date Noted   Palpitations 05/30/2018   Irregular heartbeat 03/02/2018   TIA (transient ischemic attack) 02/15/2018   Urinary, incontinence, stress female 06/16/2016   Right carotid bruit 12/23/2014   Exertional dyspnea 12/24/2013   Bilateral arm numbness and tingling while sleeping - and shortly after waking 12/24/2013   Poor balance 12/24/2013   Edema of left lower  extremity 12/21/2012   Essential hypertension; borderline    H/O hypercholesterolemia    Osteoporosis - of the spine 09/22/2011    Past Surgical History:  Procedure Laterality Date   ABDOMINAL HYSTERECTOMY     BLADDER SUSPENSION N/A 06/16/2016   Procedure: TRANSVAGINAL TAPE (TVT) PROCEDURE;  Surgeon: Everett Graff, MD;  Location: [facility];  Service: Gynecology;  Laterality: N/A;   BREAST CYST ASPIRATION  1964   COLONOSCOPY     Dr. Jodene Nam REPAIR N/A 06/16/2016   Procedure: ANTERIOR REPAIR (CYSTOCELE);  Surgeon: Everett Graff, MD;  Location: [facility];  Service: Gynecology;  Laterality: N/A;   CYSTOSCOPY N/A 06/16/2016   Procedure: CYSTOSCOPY;  Surgeon: Everett Graff, MD;  Location: [facility];  Service: Gynecology;  Laterality: N/A;  see anterior repair   Lower Extremity Venous Dopplers  01/09/2012   Right and left lower steroids: No evidence of thrombus or, thrombophlebitis; right and left GSV and SSV: No venous insufficiency. Normal exam.   NM MYOVIEW LTD  11/2016   6.6 METS.  Reached 106% of max.  Heart rate.  Walk for 4:40 min.  EF 72%.  Normal blood pressure response.  upsloping ST segment depression, nonspecific.  Otherwise normal study.  LOW RISK.  No evidence of ischemia or infarction.   TRANSTHORACIC ECHOCARDIOGRAM  01/2018  EF > 65%. LV size small. Gr 1 DD (normal for age).   Normal atrial size. Moderate Aortic Sclerosis without Stenosis. Moderate Mitral Annular Calcification with mild MR & no MS     OB History    Gravida  3   Para  3   Term  3   Preterm      AB      Living  3     SAB      TAB      Ectopic      Multiple      Live Births  3            Home Medications    Prior to Admission medications   Medication Sig Start Date End Date Taking? Authorizing Provider  acetaminophen (TYLENOL) 500 MG tablet Take 1,000 mg by mouth every 8 (eight) hours as needed for mild pain.   Yes [provider]  aspirin EC 81 MG tablet  Take 1 tablet (81 mg total) by mouth daily. 02/16/18 02/16/19 Yes Danford, Suann Larry, MD  carboxymethylcellulose (REFRESH PLUS) 0.5 % SOLN Place 2 drops into both eyes 3 (three) times daily as needed (for dry eyes).   Yes [provider]  cholecalciferol (VITAMIN D) 1000 units tablet Take 1,000 Units by mouth daily.   Yes [provider]  Cyanocobalamin (VITAMIN B-12) 3000 MCG SUBL Place 3,000 mcg under the tongue 3 (three) times a week.   Yes [provider]  Lactobacillus Rhamnosus, GG, (CULTURELLE) CAPS Take 1 capsule by mouth every morning.    Yes [provider]  Magnesium 250 MG TABS Take 250 mg by mouth daily as needed (leg cramps).   Yes [provider]  Multiple Vitamin (MULTIVITAMIN WITH MINERALS) TABS Take 1 tablet by mouth daily.   Yes [provider]  Multiple Vitamins-Minerals (HAIR VITAMINS) TABS Take 1 tablet by mouth 3 (three) times a week. Hair supplement called Hair Volume   Yes [provider]  pantoprazole (PROTONIX) 40 MG tablet Take 40 mg by mouth daily. 05/30/18  Yes [provider]  meclizine (ANTIVERT) 25 MG tablet Take 1 tablet (25 mg total) by mouth 2 (two) times daily as needed for dizziness. 05/31/18   Julianne Rice, MD    Family History Family History  Problem Relation Age of Onset   Uterine cancer Mother 94   Heart attack Brother 80    Social History Social History   Tobacco Use   Smoking status: Never Smoker   Smokeless tobacco: Never Used  Substance Use Topics   Alcohol use: No   Drug use: No     Allergies   Ivp dye [iodinated diagnostic agents] and Myrbetriq [mirabegron]   Review of Systems Review of Systems  Constitutional: Negative for chills and fever.  HENT: Negative for trouble swallowing.   Eyes: Negative for visual disturbance.  Respiratory: Negative for cough and shortness of breath.   Cardiovascular: Negative for chest pain, palpitations and leg  swelling.  Gastrointestinal: Negative for abdominal pain, constipation, diarrhea, nausea and vomiting.  Genitourinary: Negative for dysuria and flank pain.  Musculoskeletal: Positive for back pain and neck pain. Negative for myalgias.  Skin: Negative for rash and wound.  Neurological: Positive for dizziness and syncope. Negative for weakness, numbness and headaches.  All other systems reviewed and are negative.    Physical Exam Updated Vital Signs BP (!) 142/81    Pulse 83    Temp 97.6 F (36.4 C) (Oral)  Resp (!) 23    Ht 5\' 3"  (1.6 m)    Wt 61.2 kg    SpO2 96%    BMI 23.91 kg/m   Physical Exam Vitals signs and nursing note reviewed.  Constitutional:      Appearance: Normal appearance. She is well-developed.  HENT:     Head: Normocephalic and atraumatic.     Comments: No obvious head injury.  Midface is stable.  Cranial nerves II through XII intact.  Patient has mild horizontal nystagmus which is fatigable.  Fast component to the left. Eyes:     Extraocular Movements: Extraocular movements intact.     Pupils: Pupils are equal, round, and reactive to light.  Neck:     Musculoskeletal: Normal range of motion and neck supple. Muscular tenderness present.     Comments: Patient has mild tenderness palpation over C7 spinous process.  She is in a cervical collar. Cardiovascular:     Rate and Rhythm: Normal rate and regular rhythm.  Pulmonary:     Effort: Pulmonary effort is normal. No respiratory distress.     Breath sounds: Normal breath sounds. No stridor. No wheezing, rhonchi or rales.  Chest:     Chest wall: No tenderness.  Abdominal:     General: Bowel sounds are normal.     Palpations: Abdomen is soft.     Tenderness: There is no abdominal tenderness. There is no guarding or rebound.  Musculoskeletal: Normal range of motion.        General: Tenderness present. No swelling, deformity or signs of injury.     Right lower leg: No edema.     Left lower leg: No edema.      Comments: Mild inferior lumbar midline tenderness to palpation.  No step-offs deformities.  Pelvis is stable.  Distal pulses are 2+.  Patient does have contusion over the right distal radius without deformity.  Full range of motion of the right wrist and elbow.  Skin:    General: Skin is warm and dry.     Findings: No erythema or rash.  Neurological:     General: No focal deficit present.     Mental Status: She is alert and oriented to person, place, and time.     Comments: Patient is alert and oriented x3 with clear, goal oriented speech. Patient has 5/5 motor in all extremities. Sensation is intact to light touch. Bilateral finger-to-nose is normal with no signs of dysmetria. Patient has a normal gait and walks without assistance.  Psychiatric:        Behavior: Behavior normal.      ED Treatments / Results  Labs (all labs ordered are listed, but only abnormal results are displayed) Labs Reviewed  BASIC METABOLIC PANEL - Abnormal; Notable for the following components:      Result Value   Glucose, Bld 133 (*)    Creatinine, Ser 1.11 (*)    GFR calc non Af Amer 44 (*)    GFR calc Af Amer 51 (*)    All other components within normal limits  CBC - Abnormal; Notable for the following components:   HCT 34.0 (*)    All other components within normal limits  URINALYSIS, ROUTINE W REFLEX MICROSCOPIC - Abnormal; Notable for the following components:   Color, Urine STRAW (*)    All other components within normal limits  CBG MONITORING, ED - Abnormal; Notable for the following components:   Glucose-Capillary 136 (*)    All other components within normal limits  EKG EKG Interpretation  Date/Time:  Thursday May 31 2018 13:15:18 EDT Ventricular Rate:  83 PR Interval:  142 QRS Duration: 108 QT Interval:  388 QTC Calculation: 455 R Axis:   -7 Text Interpretation:  Normal sinus rhythm Possible Left atrial enlargement Incomplete right bundle branch block Borderline ECG Confirmed by  Julianne Rice [phone]) on 05/31/2018 3:42:18 PM   Radiology Dg Lumbar Spine Complete  Result Date: 05/31/2018 CLINICAL DATA:  Fall today with low back pain. EXAM: LUMBAR SPINE - COMPLETE 4+ VIEW COMPARISON:  CT 04/27/2018 FINDINGS: Five lumbar type vertebral bodies. 2 mm degenerative anterolisthesis L3-4. Disc space narrowing L3-4, L4-5 and L5-S1. Lower lumbar facet arthropathy. No evidence of acute fracture. IMPRESSION: No acute or traumatic finding. Lower lumbar degenerative disc disease and degenerative facet disease which could certainly be painful. Electronically Signed   By: Nelson Chimes M.D.   On: 05/31/2018 16:45   Ct Head Wo Contrast  Result Date: 05/31/2018 CLINICAL DATA:  Fall.  Loss of consciousness. EXAM: CT HEAD WITHOUT CONTRAST CT CERVICAL SPINE WITHOUT CONTRAST TECHNIQUE: Multidetector CT imaging of the head and cervical spine was performed following the standard protocol without intravenous contrast. Multiplanar CT image reconstructions of the cervical spine were also generated. COMPARISON:  MR brain dated February 16, 2018. CT head dated February 15, 2018. FINDINGS: CT HEAD FINDINGS Brain: No evidence of acute infarction, hemorrhage, hydrocephalus, extra-axial collection or mass lesion/mass effect. Stable atrophy. Vascular: Atherosclerotic vascular calcification of the carotid siphons. No hyperdense vessel. Skull: Normal. Negative for fracture or focal lesion. Sinuses/Orbits: No acute finding. Other: None. CT CERVICAL SPINE FINDINGS Alignment: No traumatic malalignment. Trace retrolisthesis at C5-C6. Trace anterolisthesis at C7-T1. Skull base and vertebrae: No acute fracture. No primary bone lesion or focal pathologic process. Soft tissues and spinal canal: No prevertebral fluid or swelling. No visible canal hematoma. Disc levels: Ankylosis of the C4-C5 vertebral bodies and posterior elements. Moderate disc height loss at C5-C6 and C6-C7. Mild-to-moderate right greater than left facet  arthropathy throughout the cervical spine. Upper chest: Negative. Other: None. IMPRESSION: 1.  No acute intracranial abnormality. 2.  No acute cervical spine fracture. Electronically Signed   By: Titus Dubin M.D.   On: 05/31/2018 17:23   Ct Cervical Spine Wo Contrast  Result Date: 05/31/2018 CLINICAL DATA:  Fall.  Loss of consciousness. EXAM: CT HEAD WITHOUT CONTRAST CT CERVICAL SPINE WITHOUT CONTRAST TECHNIQUE: Multidetector CT imaging of the head and cervical spine was performed following the standard protocol without intravenous contrast. Multiplanar CT image reconstructions of the cervical spine were also generated. COMPARISON:  MR brain dated February 16, 2018. CT head dated February 15, 2018. FINDINGS: CT HEAD FINDINGS Brain: No evidence of acute infarction, hemorrhage, hydrocephalus, extra-axial collection or mass lesion/mass effect. Stable atrophy. Vascular: Atherosclerotic vascular calcification of the carotid siphons. No hyperdense vessel. Skull: Normal. Negative for fracture or focal lesion. Sinuses/Orbits: No acute finding. Other: None. CT CERVICAL SPINE FINDINGS Alignment: No traumatic malalignment. Trace retrolisthesis at C5-C6. Trace anterolisthesis at C7-T1. Skull base and vertebrae: No acute fracture. No primary bone lesion or focal pathologic process. Soft tissues and spinal canal: No prevertebral fluid or swelling. No visible canal hematoma. Disc levels: Ankylosis of the C4-C5 vertebral bodies and posterior elements. Moderate disc height loss at C5-C6 and C6-C7. Mild-to-moderate right greater than left facet arthropathy throughout the cervical spine. Upper chest: Negative. Other: None. IMPRESSION: 1.  No acute intracranial abnormality. 2.  No acute cervical spine fracture. Electronically Signed   By: Titus Dubin  M.D.   On: 05/31/2018 17:23   Mr Brain Wo Contrast  Result Date: 05/31/2018 CLINICAL DATA:  Initial evaluation for acute ataxia, stroke suspected. EXAM: MRI HEAD WITHOUT  CONTRAST TECHNIQUE: Multiplanar, multiecho pulse sequences of the brain and surrounding structures were obtained without intravenous contrast. COMPARISON:  Comparison made with prior CT from earlier same day. FINDINGS: Brain: Generalized age-related cerebral atrophy with mild chronic small vessel ischemic disease. No abnormal foci of restricted diffusion to suggest acute or subacute ischemia. Gray-white matter differentiation maintained. No encephalomalacia to suggest chronic cortical infarction. No foci of susceptibility artifact to suggest acute or chronic intracranial hemorrhage. No mass lesion, midline shift or mass effect. No hydrocephalus. No extra-axial fluid collection. Pituitary gland suprasellar region within normal limits. Vascular: Major intracranial vascular flow voids are maintained. Skull and upper cervical spine: Craniocervical junction within normal limits. Upper cervical spine normal. Bone marrow signal intensity within normal limits. No scalp soft tissue abnormality. Sinuses/Orbits: Patient status post bilateral ocular lens replacement. Paranasal sinuses are clear. No mastoid effusion. Inner ear structures normal. Other: None. IMPRESSION: 1. No acute intracranial abnormality. 2. Generalized age-related cerebral atrophy with mild chronic small vessel ischemic disease. Electronically Signed   By: Jeannine Boga M.D.   On: 05/31/2018 21:34    Procedures Procedures (including critical care time)  Medications Ordered in ED Medications  sodium chloride flush (NS) 0.9 % injection 3 mL (3 mLs Intravenous Not Given 05/31/18 1528)     Initial Impression / Assessment and Plan / ED Course  I have reviewed the triage vital signs and the nursing notes.  Pertinent labs & imaging results that were available during my care of the patient were reviewed by me and considered in my medical decision making (see chart for details).        CT head cervical spine without acute findings.  MRI  brain with no evidence of stroke.  Suspect peripheral vertigo.  Advised follow-up with her primary physician.  Return precautions given.  Final Clinical Impressions(s) / ED Diagnoses   Final diagnoses:  Fall, initial encounter  Dizziness    ED Discharge Orders         Ordered    meclizine (ANTIVERT) 25 MG tablet  2 times daily PRN     05/31/18 2155           Julianne Rice, MD 05/31/18 2157

## 2018-05-31 NOTE — ED Notes (Signed)
Called pt's son to transport pt home

## 2018-05-31 NOTE — ED Notes (Signed)
Patient verbalizes understanding of discharge instructions. Opportunity for questioning and answers were provided. Armband removed by staff, pt discharged from ED by wheelchair with son to pick up in lobby

## 2018-05-31 NOTE — ED Triage Notes (Addendum)
Pt reports she bent down to pick up something and when she stood up she fell backwards onto grass, reports positive LOC, blurred vision, neck pain and right lower back pain. C-collar placed on pt.

## 2018-05-31 NOTE — ED Notes (Signed)
Pt given sandwich and drink.

## 2018-06-18 ENCOUNTER — Other Ambulatory Visit: Payer: Self-pay

## 2018-06-18 ENCOUNTER — Ambulatory Visit: Payer: Medicare Other | Attending: Internal Medicine | Admitting: Physical Therapy

## 2018-06-18 ENCOUNTER — Encounter: Payer: Self-pay | Admitting: Physical Therapy

## 2018-06-18 DIAGNOSIS — R42 Dizziness and giddiness: Secondary | ICD-10-CM

## 2018-06-18 DIAGNOSIS — R2681 Unsteadiness on feet: Secondary | ICD-10-CM | POA: Diagnosis present

## 2018-06-18 DIAGNOSIS — R296 Repeated falls: Secondary | ICD-10-CM

## 2018-06-18 DIAGNOSIS — R262 Difficulty in walking, not elsewhere classified: Secondary | ICD-10-CM | POA: Diagnosis present

## 2018-06-18 NOTE — Therapy (Signed)
[facility] [address] [place], [place], [address] Phone: [phone]   Fax:  [phone]  Physical Therapy Evaluation  Patient Details  Name: [name] MRN: [id number] Date of Birth: [birth year] Referring Provider (PT): Lavone Orn, MD   Encounter Date: 06/18/2018  PT End of Session - 06/18/18 1640    Visit Number  1    Number of Visits  13    Date for PT Re-Evaluation  08/02/18    Authorization Type  [employer] Medicare - $20 copay.  10th visit PN    PT Start Time  1300    PT Stop Time  1347    PT Time Calculation (min)  47 min    Activity Tolerance  Patient tolerated treatment well    Behavior During Therapy  WFL for tasks assessed/performed       Past Medical History:  Diagnosis Date  . Abnormal Pap smear 1975-76  . Anemia    history of  . Arthritis    spine  . Breast cyst, left 1980  . Cystocele 2012  . Diverticulosis    with h/o Diverticulitis  . GERD (gastroesophageal reflux disease)   . H/O hypercholesterolemia   . H/O osteopenia   . H/O varicella   . H/O varicose veins   . Hx of Mumps   . Macular degeneration   . Seasonal allergies   . Urge incontinence 2012  . Urinary frequency 2010    Past Surgical History:  Procedure Laterality Date  . ABDOMINAL HYSTERECTOMY    . BLADDER SUSPENSION N/A 06/16/2016   Procedure: TRANSVAGINAL TAPE (TVT) PROCEDURE;  Surgeon: Everett Graff, MD;  Location: [facility];  Service: Gynecology;  Laterality: N/A;  . BREAST CYST ASPIRATION  1964  . COLONOSCOPY     Dr. Earlean Shawl  . CYSTOCELE REPAIR N/A 06/16/2016   Procedure: ANTERIOR REPAIR (CYSTOCELE);  Surgeon: Everett Graff, MD;  Location: [facility];  Service: Gynecology;  Laterality: N/A;  . CYSTOSCOPY N/A 06/16/2016   Procedure: CYSTOSCOPY;  Surgeon: Everett Graff, MD;  Location: [facility];  Service: Gynecology;  Laterality: N/A;  see anterior repair  . Lower Extremity Venous Dopplers  01/09/2012   Right and left lower  steroids: No evidence of thrombus or, thrombophlebitis; right and left GSV and SSV: No venous insufficiency. Normal exam.  . NM MYOVIEW LTD  11/2016   6.6 METS.  Reached 106% of max.  Heart rate.  Walk for 4:40 min.  EF 72%.  Normal blood pressure response.  upsloping ST segment depression, nonspecific.  Otherwise normal study.  LOW RISK.  No evidence of ischemia or infarction.  . TRANSTHORACIC ECHOCARDIOGRAM  01/2018   EF > 65%. LV size small. Gr 1 DD (normal for age).   Normal atrial size. Moderate Aortic Sclerosis without Stenosis. Moderate Mitral Annular Calcification with mild MR & no MS    There were no vitals filed for this visit.   Subjective Assessment - 06/18/18 1303    Subjective  Pt had one episode of vertigo a long time ago but it resolved spontaneously.  Recent onsent of vertigo pt woke up with the room spinning, lasted 5-6 minutes.  Since then pt has been more off balance and had one fall outside where she fell backwards causing bruising and swelling in hands/wrists.    Pertinent History  TIA, Stage 3 CKD, urinary frequency and incontinence, macular degeneration, osteopenia, hypercholesterolemia, GERD, diverticulosis, IBS, stable angina pectoris, cystocele with repair, OA, anemia    Diagnostic tests  multiple x ray, CT, MRI after she fell - no significant findings.  MRI/MRA in January noted chronic cerebellar infarct and 67mm aneurysm    Patient Stated Goals  To help her get her balance back and be able to walk 2 miles on a trail each day    Currently in Pain?  No/denies         [facility] PT Assessment - 06/18/18 1307      Assessment   Medical Diagnosis  Vertigo    Referring Provider (PT)  Lavone Orn, MD    Onset Date/Surgical Date  06/06/18    Prior Therapy  no      Precautions   Precautions  Fall    Precaution Comments  TIA, Stage 3 CKD, urinary frequency and incontinence, macular degeneration, osteopenia, hypercholesterolemia, GERD, diverticulosis, IBS, stable angina  pectoris, cystocele with repair, OA, anemia      Balance Screen   Has the patient fallen in the past 6 months  Yes    How many times?  1   outside, fell back and hit her head   Has the patient had a decrease in activity level because of a fear of falling?   Yes      Prior Function   Level of Independence  Independent    Leisure  walking a paved trail 2 miles/day.  Hasn't been able to walk recently due to LOB      Observation/Other Assessments   Focus on Therapeutic Outcomes (FOTO)   NP      Sensation   Light Touch  Appears Intact      ROM / Strength   AROM / PROM / Strength  Strength      Strength   Overall Strength  Within functional limits for tasks performed      Standardized Balance Assessment   Standardized Balance Assessment  Timed Up and Go Test      Timed Up and Go Test   TUG  Normal TUG    Normal TUG (seconds)  19.34    TUG Comments  with L and R turn           Vestibular Assessment - 06/18/18 1311      Vestibular Assessment   General Observation  ambulates slowly and cautiously, turns slowly      Symptom Behavior   Type of Dizziness   Vertigo;Imbalance    Frequency of Dizziness  one episode; now intermittent imbalance and "dizzy spells"    Duration of Dizziness  5-6 minutes    Symptom Nature  Spontaneous    Aggravating Factors  Forward bending;Turning body quickly;Comment   picking one leg up   Relieving Factors  Comments   having external support   Progression of Symptoms  Better      Oculomotor Exam   Oculomotor Alignment  Normal    Ocular ROM  WFL    Spontaneous  Absent    Gaze-induced   Absent    Smooth Pursuits  Intact    Saccades  Intact    Comment  Convergence unable to assess; has lens implants for far/near vision      Oculomotor Exam-Fixation Suppressed    Left Head Impulse  positive    Right Head Impulse  negative      Vestibulo-Ocular Reflex   VOR to Slow Head Movement  Comment   mild symptoms of dizziness   VOR Cancellation   Comment   mild symptoms of dizziness     Visual Acuity  Static  5    Dynamic  3      Positional Testing   Dix-Hallpike  Dix-Hallpike Right;Dix-Hallpike Left    Horizontal Canal Testing  Horizontal Canal Right;Horizontal Canal Left      Dix-Hallpike Right   Dix-Hallpike Right Duration  0    Dix-Hallpike Right Symptoms  No nystagmus      Dix-Hallpike Left   Dix-Hallpike Left Duration  0    Dix-Hallpike Left Symptoms  No nystagmus      Horizontal Canal Right   Horizontal Canal Right Duration  0    Horizontal Canal Right Symptoms  Normal      Horizontal Canal Left   Horizontal Canal Left Duration  0    Horizontal Canal Left Symptoms  Normal          Objective measurements completed on examination: See above findings.              PT Education - 06/18/18 1640    Education Details  clinical findings, PT POC and goals    Person(s) Educated  Patient    Methods  Explanation    Comprehension  Verbalized understanding       PT Short Term Goals - 06/18/18 1647      PT SHORT TERM GOAL #1   Title  Pt will participate in further assessment of balance impairments during gait (FGA and gait velocity)    Time  3    Period  Weeks    Status  New    Target Date  07/09/18      PT SHORT TERM GOAL #2   Title  Pt will demonstrate independence with initial vestibular/balance HEP    Time  3    Period  Weeks    Status  New    Target Date  07/09/18      PT SHORT TERM GOAL #3   Title  Pt will demonstrate decreased falls risk as indicated by decrease in TUG to </= 17 seconds with improved balance during L and R turns    Baseline  19.34 seconds    Time  3    Period  Weeks    Status  New    Target Date  07/09/18      PT SHORT TERM GOAL #4   Title  Pt will report ability to walk 2000' outside over uneven pavement MOD I without LOB during quick turns    Time  3    Period  Weeks    Status  New    Target Date  07/09/18      PT SHORT TERM GOAL #5   Title  Pt will  demonstrate ability to reach down to the floor to retrieve items safely x 5 reps with supervision    Time  3    Period  Weeks    Status  New    Target Date  07/09/18      Additional Short Term Goals   Additional Short Term Goals  Yes        PT Long Term Goals - 06/18/18 1651      PT LONG TERM GOAL #1   Title  Pt will demonstrate independence with final HEP and will report returning to 2 mile walk on trail    Time  6    Period  Weeks    Status  New    Target Date  08/02/18      PT LONG TERM GOAL #2   Title  Pt  will improve safety with transfers/turns as indicated by decrease in TUG to </= 15 seconds    Time  6    Period  Weeks    Status  New    Target Date  08/02/18      PT LONG TERM GOAL #3   Title  Pt will improve FGA to >/= 19/30 to indicate decreased falls risk in community    Baseline  TBD    Time  6    Period  Weeks    Status  New    Target Date  08/02/18      PT LONG TERM GOAL #4   Title  Pt gait velocity will increase to >/= 2.62 ft/sec    Baseline  TBD    Time  6    Period  Weeks    Status  New    Target Date  08/02/18      PT LONG TERM GOAL #5   Title  Pt will ambulate x 3000' outside over uneven pavement MOD I with no LOB during quick turns to L and R    Time  6    Period  Weeks    Status  New    Target Date  08/02/18             Plan - 06/18/18 1641    Clinical Impression Statement  Pt is an 85 year old female referred to [facility] for evaluation of vertigo.  Pt's PMH is significant for the following: TIA, Stage 3 CKD, urinary frequency and incontinence, macular degeneration, osteopenia, hypercholesterolemia, GERD, diverticulosis, IBS, stable angina pectoris, cystocele with repair, OA, anemia. The following deficits were noted during pt's exam: disequilibrium and mild visual motion sensitivity, + head impulse test to L indicating vestibular hypofunction, impaired balance and gait.  Patient was negative for vertigo and nystagmus during R/L  Hallpike-dix and horizontal canal testing.  Pt's TUG score indicates pt is at increased risk for falls. Pt would benefit from skilled PT to address these impairments and functional limitations to maximize functional mobility independence and reduce falls risk.    Personal Factors and Comorbidities  Age;Comorbidity 3+    Comorbidities  TIA, Stage 3 CKD, urinary frequency and incontinence, macular degeneration, osteopenia, hypercholesterolemia, GERD, diverticulosis, IBS, stable angina pectoris, cystocele with repair, OA, anemia    Examination-Activity Limitations  Bend;Locomotion Level;Reach Overhead    Examination-Participation Restrictions  Community Activity    Stability/Clinical Decision Making  Stable/Uncomplicated    Clinical Decision Making  Low    Rehab Potential  Good    PT Frequency  2x / week    PT Duration  6 weeks    PT Treatment/Interventions  ADLs/Self Care Home Management;Canalith Repostioning;Gait training;Functional mobility training;Therapeutic activities;Therapeutic exercise;Balance training;Neuromuscular re-education;Patient/family education;Vestibular    PT Next Visit Plan  assess FGA and gait velocity.  Initiate HEP - x 1 viewing, corner balance, turns    Consulted and Agree with Plan of Care  Patient       Patient will benefit from skilled therapeutic intervention in order to improve the following deficits and impairments:  Decreased balance, Difficulty walking, Dizziness  Visit Diagnosis: Dizziness and giddiness  Unsteadiness on feet  Repeated falls  Difficulty in walking, not elsewhere classified     Problem List Patient Active Problem List   Diagnosis Date Noted  . Palpitations 05/30/2018  . Irregular heartbeat 03/02/2018  . TIA (transient ischemic attack) 02/15/2018  . Urinary, incontinence, stress female 06/16/2016  . Right carotid bruit 12/23/2014  .  Exertional dyspnea 12/24/2013  . Bilateral arm numbness and tingling while sleeping - and shortly  after waking 12/24/2013  . Poor balance 12/24/2013  . Edema of left lower extremity 12/21/2012  . Essential hypertension; borderline   . H/O hypercholesterolemia   . Osteoporosis - of the spine 09/22/2011    [name], PT, DPT 06/18/18    4:57 PM    [facility] [address] [place], [place], [address] Phone: [phone]   Fax:  [phone]  Name: ELLINA SIVERTSEN MRN: [id number] Date of Birth: Jun 10, 1927

## 2018-06-27 ENCOUNTER — Encounter (INDEPENDENT_AMBULATORY_CARE_PROVIDER_SITE_OTHER): Payer: Medicare Other | Admitting: Ophthalmology

## 2018-06-27 ENCOUNTER — Other Ambulatory Visit: Payer: Self-pay

## 2018-06-27 DIAGNOSIS — D332 Benign neoplasm of brain, unspecified: Secondary | ICD-10-CM

## 2018-06-27 DIAGNOSIS — H353211 Exudative age-related macular degeneration, right eye, with active choroidal neovascularization: Secondary | ICD-10-CM

## 2018-06-27 DIAGNOSIS — H43813 Vitreous degeneration, bilateral: Secondary | ICD-10-CM | POA: Diagnosis not present

## 2018-06-27 DIAGNOSIS — H353122 Nonexudative age-related macular degeneration, left eye, intermediate dry stage: Secondary | ICD-10-CM | POA: Diagnosis not present

## 2018-07-02 ENCOUNTER — Ambulatory Visit: Payer: Medicare Other | Admitting: Physical Therapy

## 2018-07-04 ENCOUNTER — Other Ambulatory Visit: Payer: Medicare Other

## 2018-07-06 ENCOUNTER — Ambulatory Visit: Payer: Medicare Other | Admitting: Physical Therapy

## 2018-07-09 ENCOUNTER — Ambulatory Visit: Payer: Medicare Other | Admitting: Physical Therapy

## 2018-07-09 ENCOUNTER — Encounter: Payer: Self-pay | Admitting: Physical Therapy

## 2018-07-09 ENCOUNTER — Other Ambulatory Visit: Payer: Self-pay

## 2018-07-09 DIAGNOSIS — R2681 Unsteadiness on feet: Secondary | ICD-10-CM

## 2018-07-09 DIAGNOSIS — R296 Repeated falls: Secondary | ICD-10-CM

## 2018-07-09 DIAGNOSIS — R262 Difficulty in walking, not elsewhere classified: Secondary | ICD-10-CM

## 2018-07-09 DIAGNOSIS — R42 Dizziness and giddiness: Secondary | ICD-10-CM

## 2018-07-09 NOTE — Therapy (Signed)
[facility] [address] [place], [place], [address] Phone: [phone]   Fax:  [phone]  Physical Therapy Treatment  Patient Details  Name: [name] MRN: [id number] Date of Birth: Nov 02, 1927 Referring Provider (PT): Lavone Orn, MD   Encounter Date: 07/09/2018   CLINIC OPERATION CHANGES: Outpatient [facility] Re[facility] is open at lower capacity following universal masking, social distancing, and patient screening.  The patient's COVID risk of complications score is 4.   PT End of Session - 07/09/18 1354    Visit Number  2    Number of Visits  13    Date for PT Re-Evaluation  08/02/18    Authorization Type  [employer] Medicare - $20 copay.  10th visit PN    PT Start Time  1300    PT Stop Time  1345    PT Time Calculation (min)  45 min    Activity Tolerance  Patient tolerated treatment well    Behavior During Therapy  WFL for tasks assessed/performed       Past Medical History:  Diagnosis Date  . Abnormal Pap smear 1975-76  . Anemia    history of  . Arthritis    spine  . Breast cyst, left 1980  . Cystocele 2012  . Diverticulosis    with h/o Diverticulitis  . GERD (gastroesophageal reflux disease)   . H/O hypercholesterolemia   . H/O osteopenia   . H/O varicella   . H/O varicose veins   . Hx of Mumps   . Macular degeneration   . Seasonal allergies   . Urge incontinence 2012  . Urinary frequency 2010    Past Surgical History:  Procedure Laterality Date  . ABDOMINAL HYSTERECTOMY    . BLADDER SUSPENSION N/A 06/16/2016   Procedure: TRANSVAGINAL TAPE (TVT) PROCEDURE;  Surgeon: Everett Graff, MD;  Location: [facility];  Service: Gynecology;  Laterality: N/A;  . BREAST CYST ASPIRATION  1964  . COLONOSCOPY     Dr. Earlean Shawl  . CYSTOCELE REPAIR N/A 06/16/2016   Procedure: ANTERIOR REPAIR (CYSTOCELE);  Surgeon: Everett Graff, MD;  Location: [facility];  Service: Gynecology;  Laterality: N/A;  . CYSTOSCOPY N/A 06/16/2016    Procedure: CYSTOSCOPY;  Surgeon: Everett Graff, MD;  Location: [facility];  Service: Gynecology;  Laterality: N/A;  see anterior repair  . Lower Extremity Venous Dopplers  01/09/2012   Right and left lower steroids: No evidence of thrombus or, thrombophlebitis; right and left GSV and SSV: No venous insufficiency. Normal exam.  . NM MYOVIEW LTD  11/2016   6.6 METS.  Reached 106% of max.  Heart rate.  Walk for 4:40 min.  EF 72%.  Normal blood pressure response.  upsloping ST segment depression, nonspecific.  Otherwise normal study.  LOW RISK.  No evidence of ischemia or infarction.  . TRANSTHORACIC ECHOCARDIOGRAM  01/2018   EF > 65%. LV size small. Gr 1 DD (normal for age).   Normal atrial size. Moderate Aortic Sclerosis without Stenosis. Moderate Mitral Annular Calcification with mild MR & no MS    There were no vitals filed for this visit.  Subjective Assessment - 07/09/18 1302    Subjective  No changes since initial eval.  Has not had any further falls.  Still feels off balance and dizzy when bending down to the ground.    Pertinent History  TIA, Stage 3 CKD, urinary frequency and incontinence, macular degeneration, osteopenia, hypercholesterolemia, GERD, diverticulosis, IBS, stable angina pectoris, cystocele with repair, OA, anemia  Diagnostic tests  multiple x ray, CT, MRI after she fell - no significant findings.  MRI/MRA in January noted chronic cerebellar infarct and 68mm aneurysm    Patient Stated Goals  To help her get her balance back and be able to walk 2 miles on a trail each day    Currently in Pain?  No/denies         [facility] PT Assessment - 07/09/18 1304      Functional Gait  Assessment   Gait assessed   Yes    Gait Level Surface  Walks 20 ft, slow speed, abnormal gait pattern, evidence for imbalance or deviates 10-15 in outside of the 12 in walkway width. Requires more than 7 sec to ambulate 20 ft.    Change in Gait Speed  Able to change speed, demonstrates mild gait  deviations, deviates 6-10 in outside of the 12 in walkway width, or no gait deviations, unable to achieve a major change in velocity, or uses a change in velocity, or uses an assistive device.    Gait with Horizontal Head Turns  Performs head turns smoothly with slight change in gait velocity (eg, minor disruption to smooth gait path), deviates 6-10 in outside 12 in walkway width, or uses an assistive device.    Gait with Vertical Head Turns  Performs task with slight change in gait velocity (eg, minor disruption to smooth gait path), deviates 6 - 10 in outside 12 in walkway width or uses assistive device    Gait and Pivot Turn  Pivot turns safely in greater than 3 sec and stops with no loss of balance, or pivot turns safely within 3 sec and stops with mild imbalance, requires small steps to catch balance.    Step Over Obstacle  Is able to step over one shoe box (4.5 in total height) but must slow down and adjust steps to clear box safely. May require verbal cueing.    Gait with Narrow Base of Support  Ambulates 4-7 steps.    Gait with Eyes Closed  Walks 20 ft, slow speed, abnormal gait pattern, evidence for imbalance, deviates 10-15 in outside 12 in walkway width. Requires more than 9 sec to ambulate 20 ft.    Ambulating Backwards  Walks 20 ft, slow speed, abnormal gait pattern, evidence for imbalance, deviates 10-15 in outside 12 in walkway width.    Steps  Alternating feet, must use rail.    Total Score  15    FGA comment:  15/30                    Vestibular Treatment/Exercise - 07/09/18 1315      Vestibular Treatment/Exercise   Vestibular Treatment Provided  Gaze    Gaze Exercises  X1 Viewing Horizontal;X1 Viewing Vertical      X1 Viewing Horizontal   Foot Position  seated, back unsupported and then supported, 3 feet away    Reps  3    Comments  30 seconds each.  When back was unsupported pt noted to have uncoordinated head turns and began to turn her whole body      X1  Viewing Vertical   Foot Position  seated in chair with back support, 3 ft away    Reps  2    Comments  30 seconds each; reported letter moving around          Balance Exercises - 07/09/18 1345      Balance Exercises: Standing   Standing Eyes Opened  Narrow base of support (BOS);Head turns;Solid surface;Other reps (comment)   10 reps head nods/turn. feet together   Standing Eyes Closed  Narrow base of support (BOS);Solid surface;10 secs;2 reps   feet together   Tandem Gait  Forward;Upper extremity support;4 reps    Retro Gait  Upper extremity support;4 reps        PT Education - 07/09/18 1354    Education Details  clinical findings with FGA, falls risk, initiated HEP    Person(s) Educated  Patient    Methods  Explanation;Demonstration;Handout    Comprehension  Verbalized understanding;Returned demonstration       PT Short Term Goals - 07/09/18 1359      PT SHORT TERM GOAL #1   Title  Pt will participate in further assessment of balance impairments during gait (FGA and gait velocity)    Time  3    Period  Weeks    Status  Achieved    Target Date  07/09/18      PT SHORT TERM GOAL #2   Title  Pt will demonstrate independence with initial vestibular/balance HEP    Time  3    Period  Weeks    Status  New    Target Date  07/16/18      PT SHORT TERM GOAL #3   Title  Pt will demonstrate decreased falls risk as indicated by decrease in TUG to </= 17 seconds with improved balance during L and R turns    Baseline  19.34 seconds    Time  3    Period  Weeks    Status  New    Target Date  07/16/18      PT SHORT TERM GOAL #4   Title  Pt will report ability to walk 2000' outside over uneven pavement MOD I without LOB during quick turns    Time  3    Period  Weeks    Status  New    Target Date  07/16/18      PT SHORT TERM GOAL #5   Title  Pt will demonstrate ability to reach down to the floor to retrieve items safely x 5 reps with supervision    Time  3    Period  Weeks     Status  New    Target Date  07/16/18        PT Long Term Goals - 07/09/18 1400      PT LONG TERM GOAL #1   Title  Pt will demonstrate independence with final HEP and will report returning to 2 mile walk on trail  (LTG due by 7/17)    Time  6    Period  Weeks    Status  New      PT LONG TERM GOAL #2   Title  Pt will improve safety with transfers/turns as indicated by decrease in TUG to </= 15 seconds    Time  6    Period  Weeks    Status  New      PT LONG TERM GOAL #3   Title  Pt will improve FGA to >/= 19/30 to indicate decreased falls risk in community    Baseline  15/30    Time  6    Period  Weeks    Status  New      PT LONG TERM GOAL #4   Title  Pt gait velocity will increase to >/= 3.0 ft/sec    Baseline  2.65 ft/sec  Time  6    Period  Weeks    Status  Revised      PT LONG TERM GOAL #5   Title  Pt will ambulate x 3000' outside over uneven pavement MOD I with no LOB during quick turns to L and R    Time  6    Period  Weeks    Status  New            Plan - 07/09/18 1354    Clinical Impression Statement  Treatment session focused on continued assessment of balance impairments and falls risk during more dynamic gait activities with FGA.  Pt demonstrates high falls risk during more dynamic challenges during gait as indicated by FGA 15/30.  Initiated vestibular and balance HEP to address these impairments.  Pt currently requires back support when performing gaze adaptation to prevent compensatory trunk rotation and flexion.  Pt tolerated well; will continue to progress towards LTG.    Personal Factors and Comorbidities  Age;Comorbidity 3+    Comorbidities  TIA, Stage 3 CKD, urinary frequency and incontinence, macular degeneration, osteopenia, hypercholesterolemia, GERD, diverticulosis, IBS, stable angina pectoris, cystocele with repair, OA, anemia    Examination-Activity Limitations  Bend;Locomotion Level;Reach Overhead    Examination-Participation Restrictions   Community Activity    Stability/Clinical Decision Making  Stable/Uncomplicated    Rehab Potential  Good    PT Frequency  2x / week    PT Duration  6 weeks    PT Treatment/Interventions  ADLs/Self Care Home Management;Canalith Repostioning;Gait training;Functional mobility training;Therapeutic activities;Therapeutic exercise;Balance training;Neuromuscular re-education;Patient/family education;Vestibular    PT Next Visit Plan  Progress x1 viewing and corner balance.  Balance reaction training.  gait with head turns.    Consulted and Agree with Plan of Care  Patient       Patient will benefit from skilled therapeutic intervention in order to improve the following deficits and impairments:  Decreased balance, Difficulty walking, Dizziness  Visit Diagnosis: 1. Dizziness and giddiness   2. Unsteadiness on feet   3. Repeated falls   4. Difficulty in walking, not elsewhere classified        Problem List Patient Active Problem List   Diagnosis Date Noted  . Palpitations 05/30/2018  . Irregular heartbeat 03/02/2018  . TIA (transient ischemic attack) 02/15/2018  . Urinary, incontinence, stress female 06/16/2016  . Right carotid bruit 12/23/2014  . Exertional dyspnea 12/24/2013  . Bilateral arm numbness and tingling while sleeping - and shortly after waking 12/24/2013  . Poor balance 12/24/2013  . Edema of left lower extremity 12/21/2012  . Essential hypertension; borderline   . H/O hypercholesterolemia   . Osteoporosis - of the spine 09/22/2011    Rico Junker, PT, DPT 07/09/18    2:02 PM    [facility] [address] [place], [place], [address] Phone: [phone]   Fax:  [phone]  Name: [name] MRN: [id number] Date of Birth: December 03, 1927

## 2018-07-09 NOTE — Patient Instructions (Signed)
Gaze Stabilization: Sitting    Keeping eyes on target on wall 3 feet away, and move head side to side for _30___ seconds. Repeat while moving head up and down for __30__ seconds. Do __2-3__ sessions per day.  Copyright  VHI. All rights reserved.   Gaze Stabilization: Tip Card  1.Target must remain in focus, not blurry, and appear stationary while head is in motion. 2.Perform exercises with small head movements (45 to either side of midline). 3.Increase speed of head motion so long as target is in focus. 4.If you wear eyeglasses, be sure you can see target through lens (therapist will give specific instructions for bifocal / progressive lenses). 5.These exercises may provoke dizziness or nausea. Work through these symptoms. If too dizzy, slow head movement slightly. Rest between each exercise. 6.Exercises demand concentration; avoid distractions.   Feet Heel-Toe "Tandem"     FUNCTIONAL MOBILITY: Walk Backward    After heel-toe walking.  Feet apart.  Take large steps, do not drag feet.  With one hand touching your counter top: walk forwards in a straight line bringing one foot directly in front of the other.  When you reach the end of your counter top, walk backwards with a wider stance.  Repeat walking forwards heel-toe and then backwards (feet apart) to the starting position, 4 laps.Do _2___ sessions per day.   Stand with your back to a corner.  Chair in front for support if needed.  Feet Together, Head Motion - Eyes Open    With eyes open, feet together, move head slowly: up and down 10 times.  Side to side 10 times slowly. Repeat 1 times per session. Do 2 sessions per day.    Feet Together, Eyes Closed - NO head movements      With eyes closed and feet together, hold your balance for 10 seconds.  Do not move your head.

## 2018-07-13 ENCOUNTER — Ambulatory Visit: Payer: Medicare Other | Admitting: Physical Therapy

## 2018-07-13 ENCOUNTER — Encounter: Payer: Self-pay | Admitting: Physical Therapy

## 2018-07-13 ENCOUNTER — Other Ambulatory Visit: Payer: Self-pay

## 2018-07-13 DIAGNOSIS — R2681 Unsteadiness on feet: Secondary | ICD-10-CM

## 2018-07-13 DIAGNOSIS — R42 Dizziness and giddiness: Secondary | ICD-10-CM

## 2018-07-13 DIAGNOSIS — R262 Difficulty in walking, not elsewhere classified: Secondary | ICD-10-CM

## 2018-07-13 DIAGNOSIS — R296 Repeated falls: Secondary | ICD-10-CM

## 2018-07-13 NOTE — Therapy (Signed)
[facility] [address] [place], [place], [address] Phone: [phone]   Fax:  [phone]  Physical Therapy Treatment  Patient Details  Name: [name] MRN: [id number] Date of Birth: 11-30-1927 Referring Provider (PT): Lavone Orn, MD   Encounter Date: 07/13/2018   CLINIC OPERATION CHANGES: [facility] is open at lower capacity following universal masking, social distancing, and patient screening.  The patient's COVID risk of complications score is 4.   PT End of Session - 07/13/18 0756    Visit Number  3    Number of Visits  13    Date for PT Re-Evaluation  08/02/18    Authorization Type  [employer] Medicare - $20 copay.  10th visit PN    PT Start Time  0700    PT Stop Time  0745    PT Time Calculation (min)  45 min    Activity Tolerance  Patient tolerated treatment well    Behavior During Therapy  WFL for tasks assessed/performed       Past Medical History:  Diagnosis Date  . Abnormal Pap smear 1975-76  . Anemia    history of  . Arthritis    spine  . Breast cyst, left 1980  . Cystocele 2012  . Diverticulosis    with h/o Diverticulitis  . GERD (gastroesophageal reflux disease)   . H/O hypercholesterolemia   . H/O osteopenia   . H/O varicella   . H/O varicose veins   . Hx of Mumps   . Macular degeneration   . Seasonal allergies   . Urge incontinence 2012  . Urinary frequency 2010    Past Surgical History:  Procedure Laterality Date  . ABDOMINAL HYSTERECTOMY    . BLADDER SUSPENSION N/A 06/16/2016   Procedure: TRANSVAGINAL TAPE (TVT) PROCEDURE;  Surgeon: Everett Graff, MD;  Location: [facility];  Service: Gynecology;  Laterality: N/A;  . BREAST CYST ASPIRATION  1964  . COLONOSCOPY     Dr. Earlean Shawl  . CYSTOCELE REPAIR N/A 06/16/2016   Procedure: ANTERIOR REPAIR (CYSTOCELE);  Surgeon: Everett Graff, MD;  Location: [facility];  Service: Gynecology;  Laterality: N/A;  . CYSTOSCOPY N/A 06/16/2016    Procedure: CYSTOSCOPY;  Surgeon: Everett Graff, MD;  Location: [facility];  Service: Gynecology;  Laterality: N/A;  see anterior repair  . Lower Extremity Venous Dopplers  01/09/2012   Right and left lower steroids: No evidence of thrombus or, thrombophlebitis; right and left GSV and SSV: No venous insufficiency. Normal exam.  . NM MYOVIEW LTD  11/2016   6.6 METS.  Reached 106% of max.  Heart rate.  Walk for 4:40 min.  EF 72%.  Normal blood pressure response.  upsloping ST segment depression, nonspecific.  Otherwise normal study.  LOW RISK.  No evidence of ischemia or infarction.  . TRANSTHORACIC ECHOCARDIOGRAM  01/2018   EF > 65%. LV size small. Gr 1 DD (normal for age).   Normal atrial size. Moderate Aortic Sclerosis without Stenosis. Moderate Mitral Annular Calcification with mild MR & no MS    There were no vitals filed for this visit.  Subjective Assessment - 07/13/18 0705    Subjective  "Still wobbly".  Had a hard time performing tandem gait.  Not sure if she is doing the letter exercise correctly.    Pertinent History  TIA, Stage 3 CKD, urinary frequency and incontinence, macular degeneration, osteopenia, hypercholesterolemia, GERD, diverticulosis, IBS, stable angina pectoris, cystocele with repair, OA, anemia    Diagnostic tests  multiple x ray, CT, MRI after she fell - no significant findings.  MRI/MRA in January noted chronic cerebellar infarct and 32mm aneurysm    Patient Stated Goals  To help her get her balance back and be able to walk 2 miles on a trail each day    Currently in Pain?  No/denies                        Vestibular Treatment/Exercise - 07/13/18 0720      Vestibular Treatment/Exercise   Vestibular Treatment Provided  Gaze    Habituation Exercises  Seated Vertical Head Turns;Standing Vertical Head Turns    Gaze Exercises  X1 Viewing Horizontal;X1 Viewing Vertical      Seated Vertical Head Turns   Number of Reps   7    Symptom Description   seated  reaching down to floor for cones; no symptoms      Standing Vertical Head Turns   Number of Reps   14    Symptom Description   practiced reaching down to floor to retrieve cones with wider BOS and keeping gaze focused on object to improve balance; mild symptoms      X1 Viewing Horizontal   Foot Position  seated with back support    Reps  2    Comments  progresssed from 30 seconds > 60 seconds.  Mild symptoms after 60 seconds      X1 Viewing Vertical   Foot Position  seated with back support    Reps  2    Comments  progresssed from 30 seconds > 60 seconds.  Mild symptoms after 60 seconds         Balance Exercises - 07/13/18 0709      Balance Exercises: Standing   Standing Eyes Opened  Narrow base of support (BOS);Head turns;Foam/compliant surface;Other reps (comment)   across blue foam beam, 10 reps head nods/turns/diagonals   Balance Master: Dynamic  Standing across blue foam beam performing 4 sets of R and L side stepping with min A but no UE support on counter with pt demonstrating appropriate use of hip strategy    Tandem Gait  Forward;Intermittent upper extremity support;5 reps   with 10 second hold at end       PT Education - 07/13/18 0756    Education Details  reviewed current HEP, updated HEP, safer technique for bending down to ground to pick up items    Person(s) Educated  Patient    Methods  Explanation    Comprehension  Verbalized understanding       PT Short Term Goals - 07/09/18 1359      PT SHORT TERM GOAL #1   Title  Pt will participate in further assessment of balance impairments during gait (FGA and gait velocity)    Time  3    Period  Weeks    Status  Achieved    Target Date  07/09/18      PT SHORT TERM GOAL #2   Title  Pt will demonstrate independence with initial vestibular/balance HEP    Time  3    Period  Weeks    Status  New    Target Date  07/16/18      PT SHORT TERM GOAL #3   Title  Pt will demonstrate decreased falls risk as indicated  by decrease in TUG to </= 17 seconds with improved balance during L and R turns    Baseline  19.34 seconds  Time  3    Period  Weeks    Status  New    Target Date  07/16/18      PT SHORT TERM GOAL #4   Title  Pt will report ability to walk 2000' outside over uneven pavement MOD I without LOB during quick turns    Time  3    Period  Weeks    Status  New    Target Date  07/16/18      PT SHORT TERM GOAL #5   Title  Pt will demonstrate ability to reach down to the floor to retrieve items safely x 5 reps with supervision    Time  3    Period  Weeks    Status  New    Target Date  07/16/18        PT Long Term Goals - 07/09/18 1400      PT LONG TERM GOAL #1   Title  Pt will demonstrate independence with final HEP and will report returning to 2 mile walk on trail  (LTG due by 7/17)    Time  6    Period  Weeks    Status  New      PT LONG TERM GOAL #2   Title  Pt will improve safety with transfers/turns as indicated by decrease in TUG to </= 15 seconds    Time  6    Period  Weeks    Status  New      PT LONG TERM GOAL #3   Title  Pt will improve FGA to >/= 19/30 to indicate decreased falls risk in community    Baseline  15/30    Time  6    Period  Weeks    Status  New      PT LONG TERM GOAL #4   Title  Pt gait velocity will increase to >/= 3.0 ft/sec    Baseline  2.65 ft/sec    Time  6    Period  Weeks    Status  Revised      PT LONG TERM GOAL #5   Title  Pt will ambulate x 3000' outside over uneven pavement MOD I with no LOB during quick turns to L and R    Time  6    Period  Weeks    Status  New            Plan - 07/13/18 0756    Clinical Impression Statement  Pt reporting difficulty with HEP; reviewed exercises with therapist providing feedback to improve technique and effectiveness of exercise.  Initiated training to improve safety with reaching down to floor for items and habituation training.  Also continued to focus on balance reactions on narrow,  compliant surface with dynamic movements and static during head turns in various directions.  Pt responded well to cues and demonstrated improvement in trunk control/balance and report of symptoms within the session.  Will continue to address and progress towards LTG.    Personal Factors and Comorbidities  Age;Comorbidity 3+    Comorbidities  TIA, Stage 3 CKD, urinary frequency and incontinence, macular degeneration, osteopenia, hypercholesterolemia, GERD, diverticulosis, IBS, stable angina pectoris, cystocele with repair, OA, anemia    Examination-Activity Limitations  Bend;Locomotion Level;Reach Overhead    Examination-Participation Restrictions  Community Activity    Stability/Clinical Decision Making  Stable/Uncomplicated    Rehab Potential  Good    PT Frequency  2x / week    PT Duration  6  weeks    PT Treatment/Interventions  ADLs/Self Care Home Management;Canalith Repostioning;Gait training;Functional mobility training;Therapeutic activities;Therapeutic exercise;Balance training;Neuromuscular re-education;Patient/family education;Vestibular    PT Next Visit Plan  Reaching to floor for items in various directions, surfaces - habituation.  Blue balance beam.  Progress HEP    Consulted and Agree with Plan of Care  Patient       Patient will benefit from skilled therapeutic intervention in order to improve the following deficits and impairments:  Decreased balance, Difficulty walking, Dizziness  Visit Diagnosis: 1. Dizziness and giddiness   2. Unsteadiness on feet   3. Repeated falls   4. Difficulty in walking, not elsewhere classified        Problem List Patient Active Problem List   Diagnosis Date Noted  . Palpitations 05/30/2018  . Irregular heartbeat 03/02/2018  . TIA (transient ischemic attack) 02/15/2018  . Urinary, incontinence, stress female 06/16/2016  . Right carotid bruit 12/23/2014  . Exertional dyspnea 12/24/2013  . Bilateral arm numbness and tingling while sleeping  - and shortly after waking 12/24/2013  . Poor balance 12/24/2013  . Edema of left lower extremity 12/21/2012  . Essential hypertension; borderline   . H/O hypercholesterolemia   . Osteoporosis - of the spine 09/22/2011    Rico Junker, PT, DPT 07/13/18    8:00 AM    [facility] [address] [place], [place], [address] Phone: [phone]   Fax:  [phone]  Name: [name] MRN: [id number] Date of Birth: Jan 20, 1927

## 2018-07-13 NOTE — Patient Instructions (Signed)
*   For Letter Exercise - still in sitting, increase time to 1 minute each  * Continue heel-toe at counter - add 10 second hold at the end  * When picking up items from the floor, start with feet wider than normal, look at object and then squat down to pick up

## 2018-07-16 ENCOUNTER — Ambulatory Visit: Payer: Medicare Other | Admitting: Physical Therapy

## 2018-07-16 ENCOUNTER — Other Ambulatory Visit: Payer: Self-pay

## 2018-07-16 VITALS — BP 161/103 | HR 81

## 2018-07-16 DIAGNOSIS — R42 Dizziness and giddiness: Secondary | ICD-10-CM

## 2018-07-16 DIAGNOSIS — R296 Repeated falls: Secondary | ICD-10-CM

## 2018-07-16 DIAGNOSIS — R2681 Unsteadiness on feet: Secondary | ICD-10-CM

## 2018-07-16 DIAGNOSIS — R262 Difficulty in walking, not elsewhere classified: Secondary | ICD-10-CM

## 2018-07-16 NOTE — Patient Instructions (Addendum)
For the Letter Exercise:  Sit at the [facility] of your chair and perform side to side head movements for 1 minute; up and down for 1:30   If you wake up feeling dizzy or start feeling dizzy during the day - check your blood pressure and begin to write it down in a notebook so you can show it to your primary care physician   Blood Pressure Record Sheet To take your blood pressure, you will need a blood pressure machine. You can buy a blood pressure machine (blood pressure monitor) at your clinic, drug store, or online. When choosing one, consider:  An automatic monitor that has an arm cuff.  A cuff that wraps snugly around your upper arm. You should be able to fit only one finger between your arm and the cuff.  A device that stores blood pressure reading results.  Do not choose a monitor that measures your blood pressure from your wrist or finger. Follow your health care provider's instructions for how to take your blood pressure. To use this form:  Get one reading in the morning (a.m.) before you take any medicines.  Get one reading in the evening (p.m.) before supper.  Take at least 2 readings with each blood pressure check. This makes sure the results are correct. Wait 1-2 minutes between measurements.  Write down the results in the spaces on this form.  Repeat this once a week, or as told by your health care provider.  Make a follow-up appointment with your health care provider to discuss the results. Blood pressure log Date: _______________________  a.m. _____________________(1st reading) _____________________(2nd reading)  p.m. _____________________(1st reading) _____________________(2nd reading) Date: _______________________  a.m. _____________________(1st reading) _____________________(2nd reading)  p.m. _____________________(1st reading) _____________________(2nd reading) Date: _______________________  a.m. _____________________(1st reading) _____________________(2nd  reading)  p.m. _____________________(1st reading) _____________________(2nd reading) Date: _______________________  a.m. _____________________(1st reading) _____________________(2nd reading)  p.m. _____________________(1st reading) _____________________(2nd reading) Date: _______________________  a.m. _____________________(1st reading) _____________________(2nd reading)  p.m. _____________________(1st reading) _____________________(2nd reading) This information is not intended to replace advice given to you by your health care provider. Make sure you discuss any questions you have with your health care provider. Document Released: 10/02/2002 Document Revised: 03/03/2017 Document Reviewed: 01/03/2017 Elsevier Patient Education  2020 [employer].

## 2018-07-16 NOTE — Therapy (Signed)
[facility] [address] [place], [place], [address] Phone: [phone]   Fax:  [phone]  Physical Therapy Treatment  Patient Details  Name: [name] MRN: [id number] Date of Birth: 12/18/1927 Referring Provider (PT): John Griffin, MD   Encounter Date: 07/16/2018   CLINIC OPERATION CHANGES: Outpatient Neuro Rehab is open at lower capacity following universal masking, social distancing, and patient screening.  The patient's COVID risk of complications score is 4.   PT End of Session - 07/16/18 0907    Visit Number  4    Number of Visits  13    Date for PT Re-Evaluation  08/02/18    Authorization Type  [employer] Medicare - $20 copay.  10th visit PN    PT Start Time  0800    PT Stop Time  0845    PT Time Calculation (min)  45 min    Activity Tolerance  Patient tolerated treatment well    Behavior During Therapy  WFL for tasks assessed/performed       Past Medical History:  Diagnosis Date  . Abnormal Pap smear 1975-76  . Anemia    history of  . Arthritis    spine  . Breast cyst, left 1980  . Cystocele 2012  . Diverticulosis    with h/o Diverticulitis  . GERD (gastroesophageal reflux disease)   . H/O hypercholesterolemia   . H/O osteopenia   . H/O varicella   . H/O varicose veins   . Hx of Mumps   . Macular degeneration   . Seasonal allergies   . Urge incontinence 2012  . Urinary frequency 2010    Past Surgical History:  Procedure Laterality Date  . ABDOMINAL HYSTERECTOMY    . BLADDER SUSPENSION N/A 06/16/2016   Procedure: TRANSVAGINAL TAPE (TVT) PROCEDURE;  Surgeon: Roberts, Angela, MD;  Location: [facility];  Service: Gynecology;  Laterality: N/A;  . BREAST CYST ASPIRATION  1964  . COLONOSCOPY     Dr. Medoff  . CYSTOCELE REPAIR N/A 06/16/2016   Procedure: ANTERIOR REPAIR (CYSTOCELE);  Surgeon: Roberts, Angela, MD;  Location: [facility];  Service: Gynecology;  Laterality: N/A;  . CYSTOSCOPY N/A 06/16/2016    Procedure: CYSTOSCOPY;  Surgeon: Roberts, Angela, MD;  Location: [facility];  Service: Gynecology;  Laterality: N/A;  see anterior repair  . Lower Extremity Venous Dopplers  01/09/2012   Right and left lower steroids: No evidence of thrombus or, thrombophlebitis; right and left GSV and SSV: No venous insufficiency. Normal exam.  . NM MYOVIEW LTD  11/2016   6.6 METS.  Reached 106% of max.  Heart rate.  Walk for 4:40 min.  EF 72%.  Normal blood pressure response.  upsloping ST segment depression, nonspecific.  Otherwise normal study.  LOW RISK.  No evidence of ischemia or infarction.  . TRANSTHORACIC ECHOCARDIOGRAM  01/2018   EF > 65%. LV size small. Gr 1 DD (normal for age).   Normal atrial size. Moderate Aortic Sclerosis without Stenosis. Moderate Mitral Annular Calcification with mild MR & no MS    Vitals:   07/16/18 0808 07/16/18 0914  BP: (!) 166/89 (!) 161/103  Pulse: 88 81    Subjective Assessment - 07/16/18 0808    Subjective  Woke up feeling dizzy this morning.  Took her temperature but didn't take her BP.  This was the first time in a long time.  Denies HA, changes in speech, vision, or strength.    Pertinent History  TIA, Stage 3 CKD, urinary   frequency and incontinence, macular degeneration, osteopenia, hypercholesterolemia, GERD, diverticulosis, IBS, stable angina pectoris, cystocele with repair, OA, anemia    Diagnostic tests  multiple x ray, CT, MRI after she fell - no significant findings.  MRI/MRA in January noted chronic cerebellar infarct and 2mm aneurysm    Patient Stated Goals  To help her get her balance back and be able to walk 2 miles on a trail each day    Currently in Pain?  No/denies         [facility] PT Assessment - 07/16/18 0828      Timed Up and Go Test   TUG  Normal TUG    Normal TUG (seconds)  13.88    TUG Comments  13.88 seconds with L turns, 11.44 seconds with R turns                   [facility] Adult PT Treatment/Exercise - 07/16/18 0838       Self-Care   Self-Care  Other Self-Care Comments    Other Self-Care Comments   Pt positioned in reclined position on mat with LE supported performing grounding with diaphragmatic breathing and adding in single knee to chest stretches and lower trunk rotation to slow down pt's respiration rate and decrease BP.  Following supine breathing and grounding exercises pt reported improvement in dizziness but BP had increased slightly.      Therapeutic Activites    Therapeutic Activities  Other Therapeutic Activities    Other Therapeutic Activities  Discussed pt self monitoring BP at home when she feels symptoms of dizziness.  Provided pt with BP log worksheet to record BP at home; recorded BP at beginning of session and end of session for pt and advised pt to contact PCP when she returns home due to BP trending upwards during session.  Also advised pt to contact her husband and alert him that she is on the way home but if she were to begin to feel more dizziness or other symptoms to NOT drive and to contact EMS.  Pt verbalized understanding      Vestibular Treatment/Exercise - 07/16/18 0839      Vestibular Treatment/Exercise   Vestibular Treatment Provided  Gaze    Habituation Exercises  Standing Vertical Head Turns    Gaze Exercises  X1 Viewing Horizontal;X1 Viewing Vertical      Standing Vertical Head Turns   Number of Reps   5    Symptom Description   looking down at object on floor and reaching down to pick up various height items on floor x 5 reps with supervision.  Needed to sit afterwards due to dizziness.  Vitals assessed again with BP increasing to 179/89.        X1 Viewing Horizontal   Foot Position  seated without back support     Reps  2    Comments  x 60 minutes; beyond one minute pt experienced trunk rotation and movement of the letter      X1 Viewing Vertical   Foot Position  seated without back support    Reps  2    Comments  x 60 seconds and 90 seconds            PT  Education - 07/16/18 0907    Education Details  See TA; updated x 1 viewing       PT Short Term Goals - 07/16/18 0813      PT SHORT TERM GOAL #1   Title  Pt will   participate in further assessment of balance impairments during gait (FGA and gait velocity)    Time  3    Period  Weeks    Status  Achieved    Target Date  07/09/18      PT SHORT TERM GOAL #2   Title  Pt will demonstrate independence with initial vestibular/balance HEP    Time  3    Period  Weeks    Status  Achieved    Target Date  07/16/18      PT SHORT TERM GOAL #3   Title  Pt will demonstrate decreased falls risk as indicated by decrease in TUG to </= 17 seconds with improved balance during L and R turns    Baseline  19.34 seconds > 13.88 and 11.44 seconds for L and R turns    Time  3    Period  Weeks    Status  Achieved    Target Date  07/16/18      PT SHORT TERM GOAL #4   Title  Pt will report ability to walk 2000' outside over uneven pavement MOD I without LOB during quick turns    Baseline  due to elevated BP    Time  3    Period  Weeks    Status  Unable to assess    Target Date  07/16/18      PT SHORT TERM GOAL #5   Title  Pt will demonstrate ability to reach down to the floor to retrieve items safely x 5 reps with supervision    Time  3    Period  Weeks    Status  Achieved    Target Date  07/16/18        PT Long Term Goals - 07/09/18 1400      PT LONG TERM GOAL #1   Title  Pt will demonstrate independence with final HEP and will report returning to 2 mile walk on trail  (LTG due by 7/17)    Time  6    Period  Weeks    Status  New      PT LONG TERM GOAL #2   Title  Pt will improve safety with transfers/turns as indicated by decrease in TUG to </= 15 seconds    Time  6    Period  Weeks    Status  New      PT LONG TERM GOAL #3   Title  Pt will improve FGA to >/= 19/30 to indicate decreased falls risk in community    Baseline  15/30    Time  6    Period  Weeks    Status  New      PT  LONG TERM GOAL #4   Title  Pt gait velocity will increase to >/= 3.0 ft/sec    Baseline  2.65 ft/sec    Time  6    Period  Weeks    Status  Revised      PT LONG TERM GOAL #5   Title  Pt will ambulate x 3000' outside over uneven pavement MOD I with no LOB during quick turns to L and R    Time  6    Period  Weeks    Status  New            Plan - 07/16/18 0909    Clinical Impression Statement  Due to pt reporting that she woke up with dizziness today vitals assessed prior to beginning session,   middle of session and then end of session.  Diastolic BP noted to rise during session despite peforming supine breathing exercises and seated vestibular activities.  Also performed assessment of STG but unable to assess ambulation outside due to elevated BP.  Pt is making good progress and met 4/5 LTG; 5th goal unable to assess.  Dizziness today appears to be related to elevated BP.  Provided pt with BP log for home BP readings and advised pt to contact PCP when she returns home and alert him of rising BP readings.  Therapist to also contact PCP and alert.  Will continue to address pt's balance and vestibular impairments to progress towards LTG as pt is able to tolerate.    Personal Factors and Comorbidities  Age;Comorbidity 3+    Comorbidities  TIA, Stage 3 CKD, urinary frequency and incontinence, macular degeneration, osteopenia, hypercholesterolemia, GERD, diverticulosis, IBS, stable angina pectoris, cystocele with repair, OA, anemia    Examination-Activity Limitations  Bend;Locomotion Level;Reach Overhead    Examination-Participation Restrictions  Community Activity    Stability/Clinical Decision Making  Stable/Uncomplicated    Rehab Potential  Good    PT Frequency  2x / week    PT Duration  6 weeks    PT Treatment/Interventions  ADLs/Self Care Home Management;Canalith Repostioning;Gait training;Functional mobility training;Therapeutic activities;Therapeutic exercise;Balance  training;Neuromuscular re-education;Patient/family education;Vestibular    PT Next Visit Plan  Assess vitals.  Reaching to floor for items in various directions, surfaces - habituation.  Blue balance beam.  Progress HEP    Consulted and Agree with Plan of Care  Patient       Patient will benefit from skilled therapeutic intervention in order to improve the following deficits and impairments:  Decreased balance, Difficulty walking, Dizziness  Visit Diagnosis: 1. Dizziness and giddiness   2. Unsteadiness on feet   3. Repeated falls   4. Difficulty in walking, not elsewhere classified        Problem List Patient Active Problem List   Diagnosis Date Noted  . Palpitations 05/30/2018  . Irregular heartbeat 03/02/2018  . TIA (transient ischemic attack) 02/15/2018  . Urinary, incontinence, stress female 06/16/2016  . Right carotid bruit 12/23/2014  . Exertional dyspnea 12/24/2013  . Bilateral arm numbness and tingling while sleeping - and shortly after waking 12/24/2013  . Poor balance 12/24/2013  . Edema of left lower extremity 12/21/2012  . Essential hypertension; borderline   . H/O hypercholesterolemia   . Osteoporosis - of the spine 09/22/2011    Audra F Potter, PT, DPT 07/16/18    9:16 AM    [facility] [address] [place], [place], [address] Phone: [phone]   Fax:  [phone]  Name: [name] MRN: [id number] Date of Birth: 04/07/1927   

## 2018-07-19 ENCOUNTER — Other Ambulatory Visit: Payer: Self-pay

## 2018-07-19 ENCOUNTER — Encounter: Payer: Self-pay | Admitting: Physical Therapy

## 2018-07-19 ENCOUNTER — Ambulatory Visit: Payer: Medicare Other | Attending: Internal Medicine | Admitting: Physical Therapy

## 2018-07-19 VITALS — BP 127/76 | HR 96

## 2018-07-19 DIAGNOSIS — R296 Repeated falls: Secondary | ICD-10-CM | POA: Insufficient documentation

## 2018-07-19 DIAGNOSIS — R42 Dizziness and giddiness: Secondary | ICD-10-CM | POA: Diagnosis not present

## 2018-07-19 DIAGNOSIS — R262 Difficulty in walking, not elsewhere classified: Secondary | ICD-10-CM | POA: Diagnosis present

## 2018-07-19 DIAGNOSIS — R2681 Unsteadiness on feet: Secondary | ICD-10-CM | POA: Insufficient documentation

## 2018-07-19 NOTE — Therapy (Signed)
[facility] [address] [place], [place], [address] Phone: [phone]   Fax:  [phone]  Physical Therapy Treatment  Patient Details  Name: [name] MRN: [id number] Date of Birth: 01-05-1928 Referring Provider (PT): Lavone Orn, MD   Encounter Date: 07/19/2018   CLINIC OPERATION CHANGES: Outpatient Neuro Rehab is open at lower capacity following universal masking, social distancing, and patient screening.  The patient's COVID risk of complications score is 4.   PT End of Session - 07/19/18 1353    Visit Number  5    Number of Visits  13    Date for PT Re-Evaluation  08/02/18    Authorization Type  [employer] Medicare - $20 copay.  10th visit PN    PT Start Time  1300    PT Stop Time  1346    PT Time Calculation (min)  46 min    Activity Tolerance  Patient tolerated treatment well    Behavior During Therapy  WFL for tasks assessed/performed       Past Medical History:  Diagnosis Date  . Abnormal Pap smear 1975-76  . Anemia    history of  . Arthritis    spine  . Breast cyst, left 1980  . Cystocele 2012  . Diverticulosis    with h/o Diverticulitis  . GERD (gastroesophageal reflux disease)   . H/O hypercholesterolemia   . H/O osteopenia   . H/O varicella   . H/O varicose veins   . Hx of Mumps   . Macular degeneration   . Seasonal allergies   . Urge incontinence 2012  . Urinary frequency 2010    Past Surgical History:  Procedure Laterality Date  . ABDOMINAL HYSTERECTOMY    . BLADDER SUSPENSION N/A 06/16/2016   Procedure: TRANSVAGINAL TAPE (TVT) PROCEDURE;  Surgeon: Everett Graff, MD;  Location: [facility];  Service: Gynecology;  Laterality: N/A;  . BREAST CYST ASPIRATION  1964  . COLONOSCOPY     Dr. Earlean Shawl  . CYSTOCELE REPAIR N/A 06/16/2016   Procedure: ANTERIOR REPAIR (CYSTOCELE);  Surgeon: Everett Graff, MD;  Location: [facility];  Service: Gynecology;  Laterality: N/A;  . CYSTOSCOPY N/A 06/16/2016    Procedure: CYSTOSCOPY;  Surgeon: Everett Graff, MD;  Location: [facility];  Service: Gynecology;  Laterality: N/A;  see anterior repair  . Lower Extremity Venous Dopplers  01/09/2012   Right and left lower steroids: No evidence of thrombus or, thrombophlebitis; right and left GSV and SSV: No venous insufficiency. Normal exam.  . NM MYOVIEW LTD  11/2016   6.6 METS.  Reached 106% of max.  Heart rate.  Walk for 4:40 min.  EF 72%.  Normal blood pressure response.  upsloping ST segment depression, nonspecific.  Otherwise normal study.  LOW RISK.  No evidence of ischemia or infarction.  . TRANSTHORACIC ECHOCARDIOGRAM  01/2018   EF > 65%. LV size small. Gr 1 DD (normal for age).   Normal atrial size. Moderate Aortic Sclerosis without Stenosis. Moderate Mitral Annular Calcification with mild MR & no MS    Vitals:   07/19/18 1308  BP: 127/76  Pulse: 96    Subjective Assessment - 07/19/18 1303    Subjective  Feeling better than last session.  The elevated BP and dizziness lasted for another day.  Contacted physician who requested that pt keep a log of her BP for the next two weeks and then report back to him.  No dizziness today.    Pertinent History  TIA, Stage  3 CKD, urinary frequency and incontinence, macular degeneration, osteopenia, hypercholesterolemia, GERD, diverticulosis, IBS, stable angina pectoris, cystocele with repair, OA, anemia    Diagnostic tests  multiple x ray, CT, MRI after she fell - no significant findings.  MRI/MRA in January noted chronic cerebellar infarct and 58m aneurysm    Patient Stated Goals  To help her get her balance back and be able to walk 2 miles on a trail each day    Currently in Pain?  No/denies                       [facility] Adult PT Treatment/Exercise - 07/19/18 1310      Ambulation/Gait   Ambulation/Gait  Yes    Ambulation/Gait Assistance  5: Supervision    Ambulation/Gait Assistance Details  over uneven pavement to simulate pt walking along  trail; noted to have increased shuffling of feet on uneven pavement; no dizziness    Ambulation Distance (Feet)  1000 Feet    Assistive device  None    Gait Pattern  Step-through pattern;Poor foot clearance - left;Poor foot clearance - right;Shuffle    Ambulation Surface  Unlevel;Outdoor;Paved      Vestibular Treatment/Exercise - 07/19/18 1323      Vestibular Treatment/Exercise   Vestibular Treatment Provided  Gaze    Gaze Exercises  X1 Viewing Horizontal;X1 Viewing Vertical      X1 Viewing Horizontal   Foot Position  seated without back support.  Second set of seated therapist provided mild tactile cues for grounding and to keep trunk stable.  Standing with feet apart, no UE support    Reps  3    Comments  60 seconds; still rotating body more than head.  Pt required increased tactile cues in standing for trunk control/stability during head turns      X1 Viewing Vertical   Foot Position  seated without back support.  Second set of seated therapist provided mild tactile cues for grounding and to keep trunk stable    Reps  3    Comments  60 seconds; still rotating body more than head.  Pt required increased tactile cues in standing for trunk control/stability during head nods         Balance Exercises - 07/19/18 1339      Balance Exercises: Standing   Balance Beam  Performed turning (below) standing across blue foam balance beam with feet together performing looking over shoulder L and R x 10 reps with min A and cues to slow down turning to allow balance to react; also performed quarter turns to L and R x 10 reps stepping one foot off and then back on beam with min A    Turning  Right;Left;10 reps   turn to look over shoulder; quarter turns       PT Education - 07/19/18 1353    Education Details  continue x 1 viewing as previously prescribed    Person(s) Educated  Patient    Methods  Explanation    Comprehension  Verbalized understanding       PT Short Term Goals - 07/19/18  1319      PT SHORT TERM GOAL #1   Title  Pt will participate in further assessment of balance impairments during gait (FGA and gait velocity)    Time  3    Period  Weeks    Status  Achieved    Target Date  07/09/18      PT SHORT TERM GOAL #2  Title  Pt will demonstrate independence with initial vestibular/balance HEP    Time  3    Period  Weeks    Status  Achieved    Target Date  07/16/18      PT SHORT TERM GOAL #3   Title  Pt will demonstrate decreased falls risk as indicated by decrease in TUG to </= 17 seconds with improved balance during L and R turns    Baseline  19.34 seconds > 13.88 and 11.44 seconds for L and R turns    Time  3    Period  Weeks    Status  Achieved    Target Date  07/16/18      PT SHORT TERM GOAL #4   Title  Pt will report ability to walk 2000' outside over uneven pavement MOD I without LOB during quick turns    Baseline  1000' with supervision    Time  3    Period  Weeks    Status  Partially Met    Target Date  07/16/18      PT SHORT TERM GOAL #5   Title  Pt will demonstrate ability to reach down to the floor to retrieve items safely x 5 reps with supervision    Time  3    Period  Weeks    Status  Achieved    Target Date  07/16/18        PT Long Term Goals - 07/09/18 1400      PT LONG TERM GOAL #1   Title  Pt will demonstrate independence with final HEP and will report returning to 2 mile walk on trail  (LTG due by 7/17)    Time  6    Period  Weeks    Status  New      PT LONG TERM GOAL #2   Title  Pt will improve safety with transfers/turns as indicated by decrease in TUG to </= 15 seconds    Time  6    Period  Weeks    Status  New      PT LONG TERM GOAL #3   Title  Pt will improve FGA to >/= 19/30 to indicate decreased falls risk in community    Baseline  15/30    Time  6    Period  Weeks    Status  New      PT LONG TERM GOAL #4   Title  Pt gait velocity will increase to >/= 3.0 ft/sec    Baseline  2.65 ft/sec    Time  6     Period  Weeks    Status  Revised      PT LONG TERM GOAL #5   Title  Pt will ambulate x 3000' outside over uneven pavement MOD I with no LOB during quick turns to L and R    Time  6    Period  Weeks    Status  New            Plan - 07/19/18 1353    Clinical Impression Statement  Pt demonstrated improved tolerance of therapy session today.  Pt reported mild dizziness at end of session but not during session; pt demonstrated improved BP today before and with activity.   Due to improved tolerance therapy was able to assess final STG of ambulation outside over uneven paved surfaces.  Pt partially met this goal and was only able to go half set distance with supervision due to  shuffling of feet and fatigue.  Continued to work on and attempt to progress gaze adaptation and dynamic balance on compliant surfaces.  Will continue to address in order to progress towards LTG.    Personal Factors and Comorbidities  Age;Comorbidity 3+    Comorbidities  TIA, Stage 3 CKD, urinary frequency and incontinence, macular degeneration, osteopenia, hypercholesterolemia, GERD, diverticulosis, IBS, stable angina pectoris, cystocele with repair, OA, anemia    Examination-Activity Limitations  Bend;Locomotion Level;Reach Overhead    Examination-Participation Restrictions  Community Activity    Stability/Clinical Decision Making  Stable/Uncomplicated    Rehab Potential  Good    PT Frequency  2x / week    PT Duration  6 weeks    PT Treatment/Interventions  ADLs/Self Care Home Management;Canalith Repostioning;Gait training;Functional mobility training;Therapeutic activities;Therapeutic exercise;Balance training;Neuromuscular re-education;Patient/family education;Vestibular    PT Next Visit Plan  walking out side on pavement.  Reaching to floor for items in various directions, surfaces - habituation.  Blue balance beam performing trunk, head rotations.  Progress HEP    Consulted and Agree with Plan of Care  Patient        Patient will benefit from skilled therapeutic intervention in order to improve the following deficits and impairments:  Decreased balance, Difficulty walking, Dizziness  Visit Diagnosis: 1. Dizziness and giddiness   2. Unsteadiness on feet   3. Repeated falls   4. Difficulty in walking, not elsewhere classified        Problem List Patient Active Problem List   Diagnosis Date Noted  . Palpitations 05/30/2018  . Irregular heartbeat 03/02/2018  . TIA (transient ischemic attack) 02/15/2018  . Urinary, incontinence, stress female 06/16/2016  . Right carotid bruit 12/23/2014  . Exertional dyspnea 12/24/2013  . Bilateral arm numbness and tingling while sleeping - and shortly after waking 12/24/2013  . Poor balance 12/24/2013  . Edema of left lower extremity 12/21/2012  . Essential hypertension; borderline   . H/O hypercholesterolemia   . Osteoporosis - of the spine 09/22/2011   Rico Junker, PT, DPT 07/19/18    2:01 PM    [facility] [address] [place], [place], [address] Phone: [phone]   Fax:  [phone]  Name: [name] MRN: [id number] Date of Birth: [birth year]

## 2018-07-19 NOTE — Patient Instructions (Signed)
Blood Pressure Record Sheet To take your blood pressure, you will need a blood pressure machine. You can buy a blood pressure machine (blood pressure monitor) at your clinic, drug store, or online. When choosing one, consider:  An automatic monitor that has an arm cuff.  A cuff that wraps snugly around your upper arm. You should be able to fit only one finger between your arm and the cuff.  A device that stores blood pressure reading results.  Do not choose a monitor that measures your blood pressure from your wrist or finger. Follow your health care provider's instructions for how to take your blood pressure. To use this form:  Get one reading in the morning (a.m.) before you take any medicines.  Get one reading in the evening (p.m.) before supper.  Take at least 2 readings with each blood pressure check. This makes sure the results are correct. Wait 1-2 minutes between measurements.  Write down the results in the spaces on this form.  Repeat this once a week, or as told by your health care provider.  Make a follow-up appointment with your health care provider to discuss the results. Blood pressure log Date: _______________________  a.m. _____________________(1st reading) _____________________(2nd reading)  p.m. _____________________(1st reading) _____________________(2nd reading) Date: _______________________  a.m. _____________________(1st reading) _____________________(2nd reading)  p.m. _____________________(1st reading) _____________________(2nd reading) Date: _______________________  a.m. _____________________(1st reading) _____________________(2nd reading)  p.m. _____________________(1st reading) _____________________(2nd reading) Date: _______________________  a.m. _____________________(1st reading) _____________________(2nd reading)  p.m. _____________________(1st reading) _____________________(2nd reading) Date: _______________________  a.m.  _____________________(1st reading) _____________________(2nd reading)  p.m. _____________________(1st reading) _____________________(2nd reading) This information is not intended to replace advice given to you by your health care provider. Make sure you discuss any questions you have with your health care provider. Document Released: 10/02/2002 Document Revised: 03/03/2017 Document Reviewed: 01/03/2017 Elsevier Patient Education  2020 [employer]  

## 2018-07-23 ENCOUNTER — Ambulatory Visit: Payer: Medicare Other | Admitting: Physical Therapy

## 2018-07-23 ENCOUNTER — Encounter: Payer: Self-pay | Admitting: Physical Therapy

## 2018-07-23 ENCOUNTER — Other Ambulatory Visit: Payer: Self-pay

## 2018-07-23 VITALS — BP 138/86 | HR 97

## 2018-07-23 DIAGNOSIS — R296 Repeated falls: Secondary | ICD-10-CM

## 2018-07-23 DIAGNOSIS — R42 Dizziness and giddiness: Secondary | ICD-10-CM

## 2018-07-23 DIAGNOSIS — R262 Difficulty in walking, not elsewhere classified: Secondary | ICD-10-CM

## 2018-07-23 DIAGNOSIS — R2681 Unsteadiness on feet: Secondary | ICD-10-CM

## 2018-07-23 NOTE — Therapy (Signed)
[facility] [address] [place], [place], [address] Phone: [phone]   Fax:  [phone]  Physical Therapy Treatment  Patient Details  Name: [name] MRN: [id number] Date of Birth: 1927/12/08 Referring Provider (PT): Lavone Orn, MD   Encounter Date: 07/23/2018   CLINIC OPERATION CHANGES: Outpatient [facility] is open at lower capacity following universal masking, social distancing, and patient screening.  The patient's COVID risk of complications score is 4.   PT End of Session - 07/23/18 1459    Visit Number  6    Number of Visits  13    Date for PT Re-Evaluation  08/02/18    Authorization Type  [employer] Medicare - $20 copay.  10th visit PN    PT Start Time  1400    PT Stop Time  1446    PT Time Calculation (min)  46 min    Activity Tolerance  Patient tolerated treatment well    Behavior During Therapy  WFL for tasks assessed/performed       Past Medical History:  Diagnosis Date  . Abnormal Pap smear 1975-76  . Anemia    history of  . Arthritis    spine  . Breast cyst, left 1980  . Cystocele 2012  . Diverticulosis    with h/o Diverticulitis  . GERD (gastroesophageal reflux disease)   . H/O hypercholesterolemia   . H/O osteopenia   . H/O varicella   . H/O varicose veins   . Hx of Mumps   . Macular degeneration   . Seasonal allergies   . Urge incontinence 2012  . Urinary frequency 2010    Past Surgical History:  Procedure Laterality Date  . ABDOMINAL HYSTERECTOMY    . BLADDER SUSPENSION N/A 06/16/2016   Procedure: TRANSVAGINAL TAPE (TVT) PROCEDURE;  Surgeon: Everett Graff, MD;  Location: [facility];  Service: Gynecology;  Laterality: N/A;  . BREAST CYST ASPIRATION  1964  . COLONOSCOPY     Dr. Earlean Shawl  . CYSTOCELE REPAIR N/A 06/16/2016   Procedure: ANTERIOR REPAIR (CYSTOCELE);  Surgeon: Everett Graff, MD;  Location: [facility];  Service: Gynecology;  Laterality: N/A;  . CYSTOSCOPY N/A 06/16/2016    Procedure: CYSTOSCOPY;  Surgeon: Everett Graff, MD;  Location: [facility];  Service: Gynecology;  Laterality: N/A;  see anterior repair  . Lower Extremity Venous Dopplers  01/09/2012   Right and left lower steroids: No evidence of thrombus or, thrombophlebitis; right and left GSV and SSV: No venous insufficiency. Normal exam.  . NM MYOVIEW LTD  11/2016   6.6 METS.  Reached 106% of max.  Heart rate.  Walk for 4:40 min.  EF 72%.  Normal blood pressure response.  upsloping ST segment depression, nonspecific.  Otherwise normal study.  LOW RISK.  No evidence of ischemia or infarction.  . TRANSTHORACIC ECHOCARDIOGRAM  01/2018   EF > 65%. LV size small. Gr 1 DD (normal for age).   Normal atrial size. Moderate Aortic Sclerosis without Stenosis. Moderate Mitral Annular Calcification with mild MR & no MS    Vitals:   07/23/18 1405  BP: 138/86  Pulse: 97    Subjective Assessment - 07/23/18 1404    Subjective  No issues to report from the weekend.  BP was low today, 113/64.  Practiced the letter exercise but unable to do for a minute without her whole body moving.    Pertinent History  TIA, Stage 3 CKD, urinary frequency and incontinence, macular degeneration, osteopenia, hypercholesterolemia, GERD, diverticulosis, IBS,  stable angina pectoris, cystocele with repair, OA, anemia    Diagnostic tests  multiple x ray, CT, MRI after she fell - no significant findings.  MRI/MRA in January noted chronic cerebellar infarct and 62m aneurysm    Patient Stated Goals  To help her get her balance back and be able to walk 2 miles on a trail each day    Currently in Pain?  No/denies                        Vestibular Treatment/Exercise - 07/23/18 1418      Vestibular Treatment/Exercise   Vestibular Treatment Provided  Gaze    Gaze Exercises  X1 Viewing Horizontal;X1 Viewing Vertical      X1 Viewing Horizontal   Foot Position  seated with back and UE support for bracing; standing with UE support and  wall behind back and UE support on back of chair    Reps  4    Comments  60 seconds      X1 Viewing Vertical   Foot Position  seated with back and UE support for bracing; standing with UE support and wall behind back and UE support on back of chair    Reps  4    Comments  60 seconds         Balance Exercises - 07/23/18 1446      Balance Exercises: Standing   Gait with Head Turns  Forward;Retro;Foam/compliant surface   outside on pavement and grass, up/down/L/R       PT Education - 07/23/18 1457    Education Details  progress x1 viewing to supported standing; continue to monitor BP    Person(s) Educated  Patient    Methods  Explanation    Comprehension  Verbalized understanding       PT Short Term Goals - 07/19/18 1319      PT SHORT TERM GOAL #1   Title  Pt will participate in further assessment of balance impairments during gait (FGA and gait velocity)    Time  3    Period  Weeks    Status  Achieved    Target Date  07/09/18      PT SHORT TERM GOAL #2   Title  Pt will demonstrate independence with initial vestibular/balance HEP    Time  3    Period  Weeks    Status  Achieved    Target Date  07/16/18      PT SHORT TERM GOAL #3   Title  Pt will demonstrate decreased falls risk as indicated by decrease in TUG to </= 17 seconds with improved balance during L and R turns    Baseline  19.34 seconds > 13.88 and 11.44 seconds for L and R turns    Time  3    Period  Weeks    Status  Achieved    Target Date  07/16/18      PT SHORT TERM GOAL #4   Title  Pt will report ability to walk 2000' outside over uneven pavement MOD I without LOB during quick turns    Baseline  1000' with supervision    Time  3    Period  Weeks    Status  Partially Met    Target Date  07/16/18      PT SHORT TERM GOAL #5   Title  Pt will demonstrate ability to reach down to the floor to retrieve items safely x 5 reps with  supervision    Time  3    Period  Weeks    Status  Achieved    Target  Date  07/16/18        PT Long Term Goals - 07/09/18 1400      PT LONG TERM GOAL #1   Title  Pt will demonstrate independence with final HEP and will report returning to 2 mile walk on trail  (LTG due by 7/17)    Time  6    Period  Weeks    Status  New      PT LONG TERM GOAL #2   Title  Pt will improve safety with transfers/turns as indicated by decrease in TUG to </= 15 seconds    Time  6    Period  Weeks    Status  New      PT LONG TERM GOAL #3   Title  Pt will improve FGA to >/= 19/30 to indicate decreased falls risk in community    Baseline  15/30    Time  6    Period  Weeks    Status  New      PT LONG TERM GOAL #4   Title  Pt gait velocity will increase to >/= 3.0 ft/sec    Baseline  2.65 ft/sec    Time  6    Period  Weeks    Status  Revised      PT LONG TERM GOAL #5   Title  Pt will ambulate x 3000' outside over uneven pavement MOD I with no LOB during quick turns to L and R    Time  6    Period  Weeks    Status  New            Plan - 07/23/18 1459    Clinical Impression Statement  Pt continues to demonstrate fluctuating BP; slightly elevated today but remained relatively stable throughout session.  Continued to focus on trunk and core stability while performing x1 viewing in sitting and then progressed to static standing.  Also continued to perform balance and gait training on various outdoor surfaces adding in retro walking and head turns to improve pt safety with outdoor walking.  Pt required frequent rest breaks but overall tolerated well with no significant increase in BP.  Will continue to address in order to progress towards LTG.    Personal Factors and Comorbidities  Age;Comorbidity 3+    Comorbidities  TIA, Stage 3 CKD, urinary frequency and incontinence, macular degeneration, osteopenia, hypercholesterolemia, GERD, diverticulosis, IBS, stable angina pectoris, cystocele with repair, OA, anemia    Examination-Activity Limitations  Bend;Locomotion  Level;Reach Overhead    Examination-Participation Restrictions  Community Activity    Stability/Clinical Decision Making  Stable/Uncomplicated    Rehab Potential  Good    PT Frequency  2x / week    PT Duration  6 weeks    PT Treatment/Interventions  ADLs/Self Care Home Management;Canalith Repostioning;Gait training;Functional mobility training;Therapeutic activities;Therapeutic exercise;Balance training;Neuromuscular re-education;Patient/family education;Vestibular    PT Next Visit Plan  Manually palpate HR - irregular?  Decrease amount of support needed for x1 viewing in standing.  Reaching to floor for items in various directions, surfaces - habituation.  Blue balance beam performing trunk, head rotations.  Progress HEP    Consulted and Agree with Plan of Care  Patient       Patient will benefit from skilled therapeutic intervention in order to improve the following deficits and impairments:  Decreased balance, Difficulty  walking, Dizziness  Visit Diagnosis: 1. Dizziness and giddiness   2. Unsteadiness on feet   3. Repeated falls   4. Difficulty in walking, not elsewhere classified        Problem List Patient Active Problem List   Diagnosis Date Noted  . Palpitations 05/30/2018  . Irregular heartbeat 03/02/2018  . TIA (transient ischemic attack) 02/15/2018  . Urinary, incontinence, stress female 06/16/2016  . Right carotid bruit 12/23/2014  . Exertional dyspnea 12/24/2013  . Bilateral arm numbness and tingling while sleeping - and shortly after waking 12/24/2013  . Poor balance 12/24/2013  . Edema of left lower extremity 12/21/2012  . Essential hypertension; borderline   . H/O hypercholesterolemia   . Osteoporosis - of the spine 09/22/2011    Rico Junker, PT, DPT 07/23/18    3:07 PM    [facility] [address] [place], [place], [address] Phone: [phone]   Fax:  [phone]  Name: RUNA WHITTINGHAM MRN: [id number] Date of Birth: [birth year]

## 2018-07-23 NOTE — Patient Instructions (Signed)
Gaze Stabilization - Tip Card  1.Target must remain in focus, not blurry, and appear stationary while head is in motion. 2.Perform exercises with small head movements (45 to either side of midline). 3.Increase speed of head motion so long as target is in focus. 4.If you wear eyeglasses, be sure you can see target through lens (therapist will give specific instructions for bifocal / progressive lenses). 5.These exercises may provoke dizziness or nausea. Work through these symptoms. If too dizzy, slow head movement slightly. Rest between each exercise. 6.Exercises demand concentration; avoid distractions. 7.For safety, perform standing exercises close to a counter, wall, corner, or next to someone.  Copyright  VHI. All rights reserved.   Gaze Stabilization - Standing Feet Apart   Stand with your back against a wall and hands on the back of a chair for support.  Feet shoulder width apart, keeping eyes on target on wall 3 feet away, tilt head down slightly and move head side to side for 60 seconds. Repeat while moving head up and down for 60 seconds.  Do 2-3 sessions per day.   Copyright  VHI. All rights reserved.

## 2018-07-27 ENCOUNTER — Ambulatory Visit: Payer: Medicare Other | Admitting: Physical Therapy

## 2018-07-27 ENCOUNTER — Encounter: Payer: Self-pay | Admitting: Physical Therapy

## 2018-07-27 ENCOUNTER — Other Ambulatory Visit: Payer: Self-pay

## 2018-07-27 VITALS — BP 144/79 | HR 106

## 2018-07-27 DIAGNOSIS — R262 Difficulty in walking, not elsewhere classified: Secondary | ICD-10-CM

## 2018-07-27 DIAGNOSIS — R42 Dizziness and giddiness: Secondary | ICD-10-CM

## 2018-07-27 DIAGNOSIS — R2681 Unsteadiness on feet: Secondary | ICD-10-CM

## 2018-07-27 DIAGNOSIS — R296 Repeated falls: Secondary | ICD-10-CM

## 2018-07-27 NOTE — Therapy (Signed)
[facility] [address] [place], [place], [address] Phone: [phone]   Fax:  [phone]  Physical Therapy Treatment  Patient Details  Name: [name] MRN: [id number] Date of Birth: 08-Nov-1927 Referring Provider (PT): Lavone Orn, MD   Encounter Date: 07/27/2018   CLINIC OPERATION CHANGES: Outpatient [facility] is open at lower capacity following universal masking, social distancing, and patient screening.  The patient's COVID risk of complications score is 4.   PT End of Session - 07/27/18 1355    Visit Number  7    Number of Visits  13    Date for PT Re-Evaluation  08/02/18    Authorization Type  [employer] Medicare - $20 copay.  10th visit PN    PT Start Time  1307    PT Stop Time  1347    PT Time Calculation (min)  40 min    Activity Tolerance  Patient tolerated treatment well    Behavior During Therapy  WFL for tasks assessed/performed       Past Medical History:  Diagnosis Date  . Abnormal Pap smear 1975-76  . Anemia    history of  . Arthritis    spine  . Breast cyst, left 1980  . Cystocele 2012  . Diverticulosis    with h/o Diverticulitis  . GERD (gastroesophageal reflux disease)   . H/O hypercholesterolemia   . H/O osteopenia   . H/O varicella   . H/O varicose veins   . Hx of Mumps   . Macular degeneration   . Seasonal allergies   . Urge incontinence 2012  . Urinary frequency 2010    Past Surgical History:  Procedure Laterality Date  . ABDOMINAL HYSTERECTOMY    . BLADDER SUSPENSION N/A 06/16/2016   Procedure: TRANSVAGINAL TAPE (TVT) PROCEDURE;  Surgeon: Everett Graff, MD;  Location: [facility];  Service: Gynecology;  Laterality: N/A;  . BREAST CYST ASPIRATION  1964  . COLONOSCOPY     Dr. Earlean Shawl  . CYSTOCELE REPAIR N/A 06/16/2016   Procedure: ANTERIOR REPAIR (CYSTOCELE);  Surgeon: Everett Graff, MD;  Location: [facility];  Service: Gynecology;  Laterality: N/A;  . CYSTOSCOPY N/A 06/16/2016    Procedure: CYSTOSCOPY;  Surgeon: Everett Graff, MD;  Location: [facility];  Service: Gynecology;  Laterality: N/A;  see anterior repair  . Lower Extremity Venous Dopplers  01/09/2012   Right and left lower steroids: No evidence of thrombus or, thrombophlebitis; right and left GSV and SSV: No venous insufficiency. Normal exam.  . NM MYOVIEW LTD  11/2016   6.6 METS.  Reached 106% of max.  Heart rate.  Walk for 4:40 min.  EF 72%.  Normal blood pressure response.  upsloping ST segment depression, nonspecific.  Otherwise normal study.  LOW RISK.  No evidence of ischemia or infarction.  . TRANSTHORACIC ECHOCARDIOGRAM  01/2018   EF > 65%. LV size small. Gr 1 DD (normal for age).   Normal atrial size. Moderate Aortic Sclerosis without Stenosis. Moderate Mitral Annular Calcification with mild MR & no MS    Vitals:   07/27/18 1311 07/27/18 1337  BP: 120/72 (!) 144/79  Pulse: 92 (!) 106    Subjective Assessment - 07/27/18 1310    Subjective  Still working on the letter exercise but still having a hard time keeping her body still.  BP was in the 130's today.  Needs a prop behind her to keep her balance when putting her pants on.    Pertinent History  TIA,  Stage 3 CKD, urinary frequency and incontinence, macular degeneration, osteopenia, hypercholesterolemia, GERD, diverticulosis, IBS, stable angina pectoris, cystocele with repair, OA, anemia    Diagnostic tests  multiple x ray, CT, MRI after she fell - no significant findings.  MRI/MRA in January noted chronic cerebellar infarct and 59m aneurysm    Patient Stated Goals  To help her get her balance back and be able to walk 2 miles on a trail each day    Currently in Pain?  No/denies                       [facility] - 07/27/18 1314      Ambulation/Gait   Ambulation/Gait  Yes    Ambulation/Gait Assistance  4: Min assist    Ambulation/Gait Assistance Details  on uneven grassy surfaces and uneven pavement while  performing dual task of head/eye movements vertically, horizontally and diagonally.  When performing dual task of head movements or ball toss/catch pt noted to slow gait down significantly and at times stop ambulating completely due to feeling "unsure".  Speed improved slightly on pavement but continue to require cues to continue ambulating    Ambulation Distance (Feet)  1000 Feet    Assistive device  None    Ambulation Surface  Unlevel;Outdoor;Paved;Grass      Vestibular Treatment/Exercise - 07/27/18 1339      Vestibular Treatment/Exercise   Vestibular Treatment Provided  Habituation    Habituation Exercises  Standing Horizontal Head Turns;Standing Vertical Head Turns;Standing Diagonal Head Turns      Standing Horizontal Head Turns   Symptom Description   while moving ball side to side while walking forwards on grass      Standing Vertical Head Turns   Symptom Description   while moving ball up and down while walking forwards on grass.  Also performed walking on side walk up and down inclines while performing ball bounce and toss x 50' x 2      Standing Diagonal Head Turns   Symptiom Description   while moving ball in diagonal pattern up and left, down and right; up and right, down and left while walking forwards on grass         Balance Exercises - 07/27/18 1354      Balance Exercises: Standing   SLS  Eyes open;Foam/compliant surface;Intermittent upper extremity support;5 reps   5 second hold on anterior/posterior rockerboard         PT Short Term Goals - 07/19/18 1319      PT SHORT TERM GOAL #1   Title  Pt will participate in further assessment of balance impairments during gait (FGA and gait velocity)    Time  3    Period  Weeks    Status  Achieved    Target Date  07/09/18      PT SHORT TERM GOAL #2   Title  Pt will demonstrate independence with initial vestibular/balance HEP    Time  3    Period  Weeks    Status  Achieved    Target Date  07/16/18      PT SHORT  TERM GOAL #3   Title  Pt will demonstrate decreased falls risk as indicated by decrease in TUG to </= 17 seconds with improved balance during L and R turns    Baseline  19.34 seconds > 13.88 and 11.44 seconds for L and R turns    Time  3    Period  Weeks  Status  Achieved    Target Date  07/16/18      PT SHORT TERM GOAL #4   Title  Pt will report ability to walk 2000' outside over uneven pavement MOD I without LOB during quick turns    Baseline  1000' with supervision    Time  3    Period  Weeks    Status  Partially Met    Target Date  07/16/18      PT SHORT TERM GOAL #5   Title  Pt will demonstrate ability to reach down to the floor to retrieve items safely x 5 reps with supervision    Time  3    Period  Weeks    Status  Achieved    Target Date  07/16/18        PT Long Term Goals - 07/09/18 1400      PT LONG TERM GOAL #1   Title  Pt will demonstrate independence with final HEP and will report returning to 2 mile walk on trail  (LTG due by 7/17)    Time  6    Period  Weeks    Status  New      PT LONG TERM GOAL #2   Title  Pt will improve safety with transfers/turns as indicated by decrease in TUG to </= 15 seconds    Time  6    Period  Weeks    Status  New      PT LONG TERM GOAL #3   Title  Pt will improve FGA to >/= 19/30 to indicate decreased falls risk in community    Baseline  15/30    Time  6    Period  Weeks    Status  New      PT LONG TERM GOAL #4   Title  Pt gait velocity will increase to >/= 3.0 ft/sec    Baseline  2.65 ft/sec    Time  6    Period  Weeks    Status  Revised      PT LONG TERM GOAL #5   Title  Pt will ambulate x 3000' outside over uneven pavement MOD I with no LOB during quick turns to L and R    Time  6    Period  Weeks    Status  New            Plan - 07/27/18 1355    Clinical Impression Statement  Continued higher level gait and balance training with dual tasking of head movements in various directions and eyes following  target while on uneven outdoor surfaces.  Pt demonstrated significant imbalance with dual tasking on outdoor surfaces and would slow gait down significant or stop completely.  Also performed SLS training on unsteady rockerboard to improve use of hip strategy for donning pants in standing.  Will continue to address in order to progress towards LTG.    Personal Factors and Comorbidities  Age;Comorbidity 3+    Comorbidities  TIA, Stage 3 CKD, urinary frequency and incontinence, macular degeneration, osteopenia, hypercholesterolemia, GERD, diverticulosis, IBS, stable angina pectoris, cystocele with repair, OA, anemia    Examination-Activity Limitations  Bend;Locomotion Level;Reach Overhead    Examination-Participation Restrictions  Community Activity    Stability/Clinical Decision Making  Stable/Uncomplicated    Rehab Potential  Good    PT Frequency  2x / week    PT Duration  6 weeks    PT Treatment/Interventions  ADLs/Self Care Home Management;Canalith Repostioning;Gait training;Functional  mobility training;Therapeutic activities;Therapeutic exercise;Balance training;Neuromuscular re-education;Patient/family education;Vestibular    PT Next Visit Plan  Decrease amount of support needed for x1 viewing in standing.  SLS training for donning pants in standing.  gait outside.  Reaching to floor for items in various directions, surfaces - habituation.  Blue balance beam performing trunk, head rotations.  Progress HEP    Consulted and Agree with Plan of Care  Patient       Patient will benefit from skilled therapeutic intervention in order to improve the following deficits and impairments:  Decreased balance, Difficulty walking, Dizziness  Visit Diagnosis: 1. Dizziness and giddiness   2. Unsteadiness on feet   3. Repeated falls   4. Difficulty in walking, not elsewhere classified        Problem List Patient Active Problem List   Diagnosis Date Noted  . Palpitations 05/30/2018  . Irregular  heartbeat 03/02/2018  . TIA (transient ischemic attack) 02/15/2018  . Urinary, incontinence, stress female 06/16/2016  . Right carotid bruit 12/23/2014  . Exertional dyspnea 12/24/2013  . Bilateral arm numbness and tingling while sleeping - and shortly after waking 12/24/2013  . Poor balance 12/24/2013  . Edema of left lower extremity 12/21/2012  . Essential hypertension; borderline   . H/O hypercholesterolemia   . Osteoporosis - of the spine 09/22/2011    Rico Junker, PT, DPT 07/27/18    2:00 PM     [facility] [address] [place], [place], [address] Phone: [phone]   Fax:  [phone]  Name: DANICA CAMARENA MRN: [id number] Date of Birth: 1927-11-21

## 2018-07-30 ENCOUNTER — Other Ambulatory Visit: Payer: Self-pay

## 2018-07-30 ENCOUNTER — Ambulatory Visit: Payer: Medicare Other | Admitting: Physical Therapy

## 2018-07-30 DIAGNOSIS — R262 Difficulty in walking, not elsewhere classified: Secondary | ICD-10-CM

## 2018-07-30 DIAGNOSIS — R42 Dizziness and giddiness: Secondary | ICD-10-CM | POA: Diagnosis not present

## 2018-07-30 DIAGNOSIS — R296 Repeated falls: Secondary | ICD-10-CM

## 2018-07-30 DIAGNOSIS — R2681 Unsteadiness on feet: Secondary | ICD-10-CM

## 2018-07-30 NOTE — Therapy (Signed)
[facility] [address] [place], [place], [address] Phone: [phone]   Fax:  [phone]  Physical Therapy Treatment  Patient Details  Name: [name] MRN: [id number] Date of Birth: [birth year] Referring Provider (PT): Lavone Orn, MD   Encounter Date: 07/30/2018   CLINIC OPERATION CHANGES: [facility] is open at lower capacity following universal masking, social distancing, and patient screening.  The patient's COVID risk of complications score is 4.   PT End of Session - 07/30/18 1302    Visit Number  8    Number of Visits  13    Date for PT Re-Evaluation  08/02/18    Authorization Type  [employer] Medicare - $20 copay.  10th visit PN    PT Start Time  1300    PT Stop Time  1345    PT Time Calculation (min)  45 min    Activity Tolerance  Patient tolerated treatment well    Behavior During Therapy  WFL for tasks assessed/performed       Past Medical History:  Diagnosis Date  . Abnormal Pap smear 1975-76  . Anemia    history of  . Arthritis    spine  . Breast cyst, left 1980  . Cystocele 2012  . Diverticulosis    with h/o Diverticulitis  . GERD (gastroesophageal reflux disease)   . H/O hypercholesterolemia   . H/O osteopenia   . H/O varicella   . H/O varicose veins   . Hx of Mumps   . Macular degeneration   . Seasonal allergies   . Urge incontinence 2012  . Urinary frequency 2010    Past Surgical History:  Procedure Laterality Date  . ABDOMINAL HYSTERECTOMY    . BLADDER SUSPENSION N/A 06/16/2016   Procedure: TRANSVAGINAL TAPE (TVT) PROCEDURE;  Surgeon: Everett Graff, MD;  Location: [facility];  Service: Gynecology;  Laterality: N/A;  . BREAST CYST ASPIRATION  1964  . COLONOSCOPY     Dr. Earlean Shawl  . CYSTOCELE REPAIR N/A 06/16/2016   Procedure: ANTERIOR REPAIR (CYSTOCELE);  Surgeon: Everett Graff, MD;  Location: [facility];  Service: Gynecology;  Laterality: N/A;  . CYSTOSCOPY N/A 06/16/2016    Procedure: CYSTOSCOPY;  Surgeon: Everett Graff, MD;  Location: [facility];  Service: Gynecology;  Laterality: N/A;  see anterior repair  . Lower Extremity Venous Dopplers  01/09/2012   Right and left lower steroids: No evidence of thrombus or, thrombophlebitis; right and left GSV and SSV: No venous insufficiency. Normal exam.  . NM MYOVIEW LTD  11/2016   6.6 METS.  Reached 106% of max.  Heart rate.  Walk for 4:40 min.  EF 72%.  Normal blood pressure response.  upsloping ST segment depression, nonspecific.  Otherwise normal study.  LOW RISK.  No evidence of ischemia or infarction.  . TRANSTHORACIC ECHOCARDIOGRAM  01/2018   EF > 65%. LV size small. Gr 1 DD (normal for age).   Normal atrial size. Moderate Aortic Sclerosis without Stenosis. Moderate Mitral Annular Calcification with mild MR & no MS    There were no vitals filed for this visit.  Subjective Assessment - 07/30/18 1302    Subjective  Pt had a quiet weekend.  Kept working on the letter exercise.  Asking if next week is her last visits.  "I would be sad to stop therapy."    Pertinent History  TIA, Stage 3 CKD, urinary frequency and incontinence, macular degeneration, osteopenia, hypercholesterolemia, GERD, diverticulosis, IBS, stable angina pectoris, cystocele with repair,  OA, anemia    Diagnostic tests  multiple x ray, CT, MRI after she fell - no significant findings.  MRI/MRA in January noted chronic cerebellar infarct and 56m aneurysm    Patient Stated Goals  To help her get her balance back and be able to walk 2 miles on a trail each day    Currently in Pain?  No/denies                        Vestibular Treatment/Exercise - 07/30/18 1307      Vestibular Treatment/Exercise   Vestibular Treatment Provided  Habituation;Gaze    Gaze Exercises  X1 Viewing Horizontal;X1 Viewing Vertical      Standing Horizontal Head Turns   Number of Reps   10    Symptom Description   x 3 sets.  2 sets with wide BOS, 1 with narrow  BOS.  Standing across blue balance beam      Standing Vertical Head Turns   Number of Reps   10    Symptom Description   x 3 sets.  2 sets with wide BOS, 1 with narrow BOS.  Standing across blue balance beam      Standing Diagonal Head Turns   Number of Reps   10    Symptiom Description   x 3 sets each direction.  2 sets with wide BOS, 1 with narrow BOS.  Standing across blue balance beam      X1 Viewing Horizontal   Foot Position  standing with back supported on wall and UE support on back of chair    Reps  2    Comments  60 seconds      X1 Viewing Vertical   Foot Position  standing first with back supported on wall and UE supported on back of chair; second set pt performed with only UE supported    Reps  2    Comments  60 seconds         Balance Exercises - 07/30/18 1334      Balance Exercises: Standing   SLS with Vectors  Foam/compliant surface;4 reps   standing on blue beam, 3 cone taps with R/LLE     3 cone taps to R, middle and L with R and LLE.  Single tap > double tap.  PT Education - 07/30/18 1356    Education Details  plan for next session to check goals and recertify for 4 more weeks    Person(s) Educated  Patient    Methods  Explanation    Comprehension  Verbalized understanding       PT Short Term Goals - 07/19/18 1319      PT SHORT TERM GOAL #1   Title  Pt will participate in further assessment of balance impairments during gait (FGA and gait velocity)    Time  3    Period  Weeks    Status  Achieved    Target Date  07/09/18      PT SHORT TERM GOAL #2   Title  Pt will demonstrate independence with initial vestibular/balance HEP    Time  3    Period  Weeks    Status  Achieved    Target Date  07/16/18      PT SHORT TERM GOAL #3   Title  Pt will demonstrate decreased falls risk as indicated by decrease in TUG to </= 17 seconds with improved balance during L and R turns  Baseline  19.34 seconds > 13.88 and 11.44 seconds for L and R turns    Time   3    Period  Weeks    Status  Achieved    Target Date  07/16/18      PT SHORT TERM GOAL #4   Title  Pt will report ability to walk 2000' outside over uneven pavement MOD I without LOB during quick turns    Baseline  1000' with supervision    Time  3    Period  Weeks    Status  Partially Met    Target Date  07/16/18      PT SHORT TERM GOAL #5   Title  Pt will demonstrate ability to reach down to the floor to retrieve items safely x 5 reps with supervision    Time  3    Period  Weeks    Status  Achieved    Target Date  07/16/18        PT Long Term Goals - 07/09/18 1400      PT LONG TERM GOAL #1   Title  Pt will demonstrate independence with final HEP and will report returning to 2 mile walk on trail  (LTG due by 7/17)    Time  6    Period  Weeks    Status  New      PT LONG TERM GOAL #2   Title  Pt will improve safety with transfers/turns as indicated by decrease in TUG to </= 15 seconds    Time  6    Period  Weeks    Status  New      PT LONG TERM GOAL #3   Title  Pt will improve FGA to >/= 19/30 to indicate decreased falls risk in community    Baseline  15/30    Time  6    Period  Weeks    Status  New      PT LONG TERM GOAL #4   Title  Pt gait velocity will increase to >/= 3.0 ft/sec    Baseline  2.65 ft/sec    Time  6    Period  Weeks    Status  Revised      PT LONG TERM GOAL #5   Title  Pt will ambulate x 3000' outside over uneven pavement MOD I with no LOB during quick turns to L and R    Time  6    Period  Weeks    Status  New            Plan - 07/30/18 1359    Clinical Impression Statement  Treatment session continued to focus on progression of x1 viewing in standing with decreased support through trunk but still with UE support.  Also continued to focus on static and dynamic standing balance on compliant surfaces with head turns in various directions, wide and narrow BOS, with increased visual flow and single limb stance.  Progressed amount of time  pt remained in single limb stance with cone taps in 3 directions, single and then double tap.  Pt continues to experience increased LOB with SLS.  Will assess LTG and plan to recertify for 4 more weeks to continue to address balance impairments at next visit.    Personal Factors and Comorbidities  Age;Comorbidity 3+    Comorbidities  TIA, Stage 3 CKD, urinary frequency and incontinence, macular degeneration, osteopenia, hypercholesterolemia, GERD, diverticulosis, IBS, stable angina pectoris, cystocele with repair, OA, anemia  Examination-Activity Limitations  Bend;Locomotion Level;Reach Overhead    Examination-Participation Restrictions  Community Activity    Stability/Clinical Decision Making  Stable/Uncomplicated    Rehab Potential  Good    PT Frequency  2x / week    PT Duration  6 weeks    PT Treatment/Interventions  ADLs/Self Care Home Management;Canalith Repostioning;Gait training;Functional mobility training;Therapeutic activities;Therapeutic exercise;Balance training;Neuromuscular re-education;Patient/family education;Vestibular    PT Next Visit Plan  Check LTG and send to me for recert.  Please have her schedule 2x/week x 3 more weeks (has one week scheduled already for a total of 4 weeks) with AP, JM. TVN    Consulted and Agree with Plan of Care  Patient       Patient will benefit from skilled therapeutic intervention in order to improve the following deficits and impairments:  Decreased balance, Difficulty walking, Dizziness  Visit Diagnosis: 1. Dizziness and giddiness   2. Unsteadiness on feet   3. Repeated falls   4. Difficulty in walking, not elsewhere classified        Problem List Patient Active Problem List   Diagnosis Date Noted  . Palpitations 05/30/2018  . Irregular heartbeat 03/02/2018  . TIA (transient ischemic attack) 02/15/2018  . Urinary, incontinence, stress female 06/16/2016  . Right carotid bruit 12/23/2014  . Exertional dyspnea 12/24/2013  . Bilateral  arm numbness and tingling while sleeping - and shortly after waking 12/24/2013  . Poor balance 12/24/2013  . Edema of left lower extremity 12/21/2012  . Essential hypertension; borderline   . H/O hypercholesterolemia   . Osteoporosis - of the spine 09/22/2011    Rico Junker, PT, DPT 07/30/18    2:05 PM    [facility] [address] [place], [place], [address] Phone: [phone]   Fax:  [phone]  Name: [name] MRN: [id number] Date of Birth: [birth year]

## 2018-08-03 ENCOUNTER — Other Ambulatory Visit: Payer: Self-pay

## 2018-08-03 ENCOUNTER — Ambulatory Visit: Payer: Medicare Other

## 2018-08-03 DIAGNOSIS — R42 Dizziness and giddiness: Secondary | ICD-10-CM | POA: Diagnosis not present

## 2018-08-03 DIAGNOSIS — R296 Repeated falls: Secondary | ICD-10-CM

## 2018-08-03 DIAGNOSIS — R2681 Unsteadiness on feet: Secondary | ICD-10-CM

## 2018-08-03 DIAGNOSIS — R262 Difficulty in walking, not elsewhere classified: Secondary | ICD-10-CM

## 2018-08-03 NOTE — Therapy (Signed)
[facility] [address] [place], [place], [address] Phone: [phone]   Fax:  [phone]  Physical Therapy Treatment  Patient Details  Name: [name] MRN: [id number] Date of Birth: 08/05/1927 Referring Provider (PT): John Griffin, MD   Encounter Date: 08/03/2018  PT End of Session - 08/03/18 1402    Visit Number  9    Number of Visits  17    Date for PT Re-Evaluation  09/14/18   original POC: 08/03/18   Authorization Type  [employer] Medicare - $20 copay.  10th visit PN    PT Start Time  1301    PT Stop Time  1342    PT Time Calculation (min)  41 min    Equipment Utilized During Treatment  Other (comment)   min guard to S   Activity Tolerance  Patient tolerated treatment well    Behavior During Therapy  WFL for tasks assessed/performed       Past Medical History:  Diagnosis Date  . Abnormal Pap smear 1975-76  . Anemia    history of  . Arthritis    spine  . Breast cyst, left 1980  . Cystocele 2012  . Diverticulosis    with h/o Diverticulitis  . GERD (gastroesophageal reflux disease)   . H/O hypercholesterolemia   . H/O osteopenia   . H/O varicella   . H/O varicose veins   . Hx of Mumps   . Macular degeneration   . Seasonal allergies   . Urge incontinence 2012  . Urinary frequency 2010    Past Surgical History:  Procedure Laterality Date  . ABDOMINAL HYSTERECTOMY    . BLADDER SUSPENSION N/A 06/16/2016   Procedure: TRANSVAGINAL TAPE (TVT) PROCEDURE;  Surgeon: Roberts, Angela, MD;  Location: [facility];  Service: Gynecology;  Laterality: N/A;  . BREAST CYST ASPIRATION  1964  . COLONOSCOPY     Dr. Medoff  . CYSTOCELE REPAIR N/A 06/16/2016   Procedure: ANTERIOR REPAIR (CYSTOCELE);  Surgeon: Roberts, Angela, MD;  Location: [facility];  Service: Gynecology;  Laterality: N/A;  . CYSTOSCOPY N/A 06/16/2016   Procedure: CYSTOSCOPY;  Surgeon: Roberts, Angela, MD;  Location: [facility];  Service: Gynecology;  Laterality:  N/A;  see anterior repair  . Lower Extremity Venous Dopplers  01/09/2012   Right and left lower steroids: No evidence of thrombus or, thrombophlebitis; right and left GSV and SSV: No venous insufficiency. Normal exam.  . NM MYOVIEW LTD  11/2016   6.6 METS.  Reached 106% of max.  Heart rate.  Walk for 4:40 min.  EF 72%.  Normal blood pressure response.  upsloping ST segment depression, nonspecific.  Otherwise normal study.  LOW RISK.  No evidence of ischemia or infarction.  . TRANSTHORACIC ECHOCARDIOGRAM  01/2018   EF > 65%. LV size small. Gr 1 DD (normal for age).   Normal atrial size. Moderate Aortic Sclerosis without Stenosis. Moderate Mitral Annular Calcification with mild MR & no MS    There were no vitals filed for this visit.  Subjective Assessment - 08/03/18 1306    Subjective  Pt denied falls or changes since last visit. Pt has not tried hiking yet.    Pertinent History  TIA, Stage 3 CKD, urinary frequency and incontinence, macular degeneration, osteopenia, hypercholesterolemia, GERD, diverticulosis, IBS, stable angina pectoris, cystocele with repair, OA, anemia    Patient Stated Goals  To help her get her balance back and be able to walk 2 miles on a trail each day      Currently in Pain?  No/denies         [facility] PT Assessment - 08/03/18 1307      Functional Gait  Assessment   Gait assessed   Yes    Gait Level Surface  Walks 20 ft, slow speed, abnormal gait pattern, evidence for imbalance or deviates 10-15 in outside of the 12 in walkway width. Requires more than 7 sec to ambulate 20 ft.   7.6 sec.   Change in Gait Speed  Able to change speed, demonstrates mild gait deviations, deviates 6-10 in outside of the 12 in walkway width, or no gait deviations, unable to achieve a major change in velocity, or uses a change in velocity, or uses an assistive device.    Gait with Horizontal Head Turns  Performs head turns smoothly with slight change in gait velocity (eg, minor disruption to  smooth gait path), deviates 6-10 in outside 12 in walkway width, or uses an assistive device.    Gait with Vertical Head Turns  Performs task with slight change in gait velocity (eg, minor disruption to smooth gait path), deviates 6 - 10 in outside 12 in walkway width or uses assistive device    Gait and Pivot Turn  Pivot turns safely in greater than 3 sec and stops with no loss of balance, or pivot turns safely within 3 sec and stops with mild imbalance, requires small steps to catch balance.    Step Over Obstacle  Is able to step over 2 stacked shoe boxes taped together (9 in total height) without changing gait speed. No evidence of imbalance.    Gait with Narrow Base of Support  Ambulates less than 4 steps heel to toe or cannot perform without assistance.    Gait with Eyes Closed  Walks 20 ft, slow speed, abnormal gait pattern, evidence for imbalance, deviates 10-15 in outside 12 in walkway width. Requires more than 9 sec to ambulate 20 ft.   >9 sec.   Ambulating Backwards  Walks 20 ft, uses assistive device, slower speed, mild gait deviations, deviates 6-10 in outside 12 in walkway width.    Steps  Alternating feet, no rail.    Total Score  18    FGA comment:  18/30: indicated high falls risk.                    [facility] Adult PT Treatment/Exercise - 08/03/18 1317      Ambulation/Gait   Ambulation/Gait  Yes    Ambulation/Gait Assistance  5: Supervision;4: Min guard    Ambulation/Gait Assistance Details  On uneven pavement with pt noted to occassionally veer to L side. Pt requested seated rest break after 1500' 2/2 fatigue/heat. Pt performed 180 degree turns, and turning to R and L sides with min guard to S to ensure safety. Cues to improve intermittent narrow BOS.    Ambulation Distance (Feet)  1500 Feet    Assistive device  None    Gait Pattern  Step-through pattern;Poor foot clearance - left;Poor foot clearance - right;Shuffle    Ambulation Surface   Level;Unlevel;Indoor;Outdoor;Paved    Gait velocity  2.81ft/sec, and 3.03ft/sec. no AD      Standardized Balance Assessment   Standardized Balance Assessment  Timed Up and Go Test      Timed Up and Go Test   TUG  Normal TUG    Normal TUG (seconds)  10.93             PT Education - 08/03/18 1403      Education Details  PT discussed goal progress, renewal, and outcome measure results. PT reviewed HEP and encouraged pt to continue and PT will physically review/progress next session.    Person(s) Educated  Patient    Methods  Explanation    Comprehension  Verbalized understanding       PT Short Term Goals - 07/19/18 1319      PT SHORT TERM GOAL #1   Title  Pt will participate in further assessment of balance impairments during gait (FGA and gait velocity)    Time  3    Period  Weeks    Status  Achieved    Target Date  07/09/18      PT SHORT TERM GOAL #2   Title  Pt will demonstrate independence with initial vestibular/balance HEP    Time  3    Period  Weeks    Status  Achieved    Target Date  07/16/18      PT SHORT TERM GOAL #3   Title  Pt will demonstrate decreased falls risk as indicated by decrease in TUG to </= 17 seconds with improved balance during L and R turns    Baseline  19.34 seconds > 13.88 and 11.44 seconds for L and R turns    Time  3    Period  Weeks    Status  Achieved    Target Date  07/16/18      PT SHORT TERM GOAL #4   Title  Pt will report ability to walk 2000' outside over uneven pavement MOD I without LOB during quick turns    Baseline  1000' with supervision    Time  3    Period  Weeks    Status  Partially Met    Target Date  07/16/18      PT SHORT TERM GOAL #5   Title  Pt will demonstrate ability to reach down to the floor to retrieve items safely x 5 reps with supervision    Time  3    Period  Weeks    Status  Achieved    Target Date  07/16/18        PT Long Term Goals - 08/03/18 1349      PT LONG TERM GOAL #1   Title  Pt will  demonstrate independence with final HEP and will report returning to 2 mile walk on trail  (LTG due by 7/17) ALL UNMET GOALS WILL BE CARRIED OVER TO NEW POC: 09/14/18    Baseline  pt not comfortable performing tandem or backwards walking HEP, has not attempted trail walk 08/03/18    Time  6    Period  Weeks    Status  Partially Met      PT LONG TERM GOAL #2   Title  Pt will improve safety with transfers/turns as indicated by decrease in TUG to </= 15 seconds    Baseline  10.93 sec.    Time  6    Period  Weeks    Status  Achieved      PT LONG TERM GOAL #3   Title  Pt will improve FGA to >/= 19/30 to indicate decreased falls risk in community    Baseline  18/30 on 08/03/18, revised from 19/30to 22/30 due to progress    Time  6    Period  Weeks    Status  Revised      PT LONG TERM GOAL #4   Title  Pt gait velocity will  increase to >/= 3.0 ft/sec    Baseline  3.03 ft/sec    Time  6    Period  Weeks    Status  Achieved      PT LONG TERM GOAL #5   Title  Pt will ambulate x 3000' outside over uneven pavement MOD I with no LOB during quick turns to L and R    Baseline  1500' with min guard to S    Time  6    Period  Weeks    Status  Partially Met            Plan - 08/03/18 1338    Clinical Impression Statement  Pt demonstrating progress as she met LTGs 2 and 4. Pt partially met LTGs 1, 3, and 5. Pt's FGA score continues to indicate pt is at high risk for falls. Pt would continue to benefit from skilled PT to improve dizziness and safety during functional mobility. PT requesting addtional 2x/week for 4 weeks.    Personal Factors and Comorbidities  Age;Comorbidity 3+    Comorbidities  TIA, Stage 3 CKD, urinary frequency and incontinence, macular degeneration, osteopenia, hypercholesterolemia, GERD, diverticulosis, IBS, stable angina pectoris, cystocele with repair, OA, anemia    Examination-Activity Limitations  Bend;Locomotion Level;Reach Overhead    Examination-Participation  Restrictions  Community Activity    Stability/Clinical Decision Making  Stable/Uncomplicated    Rehab Potential  Good    PT Frequency  2x / week    PT Duration  4 weeks   requesting additional 2x/week for 4 weeks (over a 6 week period due to scheduling is limited)   PT Treatment/Interventions  ADLs/Self Care Home Management;Canalith Repostioning;Gait training;Functional mobility training;Therapeutic activities;Therapeutic exercise;Balance training;Neuromuscular re-education;Patient/family education;Vestibular    PT Next Visit Plan  Reassess HEP and progress as tolerated. High level balance over compliant surfaces.    Consulted and Agree with Plan of Care  Patient       Patient will benefit from skilled therapeutic intervention in order to improve the following deficits and impairments:  Decreased balance, Difficulty walking, Dizziness  Visit Diagnosis: 1. Unsteadiness on feet   2. Difficulty in walking, not elsewhere classified   3. Repeated falls   4. Dizziness and giddiness        Problem List Patient Active Problem List   Diagnosis Date Noted  . Palpitations 05/30/2018  . Irregular heartbeat 03/02/2018  . TIA (transient ischemic attack) 02/15/2018  . Urinary, incontinence, stress female 06/16/2016  . Right carotid bruit 12/23/2014  . Exertional dyspnea 12/24/2013  . Bilateral arm numbness and tingling while sleeping - and shortly after waking 12/24/2013  . Poor balance 12/24/2013  . Edema of left lower extremity 12/21/2012  . Essential hypertension; borderline   . H/O hypercholesterolemia   . Osteoporosis - of the spine 09/22/2011    Miller,Jennifer L 08/03/2018, 2:04 PM  [facility] [address] [place], [place], [address] Phone: [phone]   Fax:  [phone]  Name: Kataleyah P Pons MRN: [id number] Date of Birth: 04/04/1927  Jennifer Miller, PT,DPT 08/03/18 2:04 PM Phone: [phone] Fax:  [phone]   

## 2018-08-13 ENCOUNTER — Ambulatory Visit: Payer: Medicare Other | Admitting: Physical Therapy

## 2018-08-13 ENCOUNTER — Encounter: Payer: Self-pay | Admitting: Physical Therapy

## 2018-08-13 ENCOUNTER — Other Ambulatory Visit: Payer: Self-pay

## 2018-08-13 DIAGNOSIS — R296 Repeated falls: Secondary | ICD-10-CM

## 2018-08-13 DIAGNOSIS — R42 Dizziness and giddiness: Secondary | ICD-10-CM

## 2018-08-13 DIAGNOSIS — R2681 Unsteadiness on feet: Secondary | ICD-10-CM

## 2018-08-13 DIAGNOSIS — R262 Difficulty in walking, not elsewhere classified: Secondary | ICD-10-CM

## 2018-08-13 NOTE — Patient Instructions (Addendum)
Feet Together, Eyes Closed - Head Movements, Head Up and Down, Head Side to Side (Saying No)     With eyes closed and feet together, nod head up and down and side to side. Perform 2 sets of 10 of each.                     Blood Pressure Record Sheet To take your blood pressure, you will need a blood pressure machine. You can buy a blood pressure machine (blood pressure monitor) at your clinic, drug store, or online. When choosing one, consider:  An automatic monitor that has an arm cuff.  A cuff that wraps snugly around your upper arm. You should be able to fit only one finger between your arm and the cuff.  A device that stores blood pressure reading results.  Do not choose a monitor that measures your blood pressure from your wrist or finger. Follow your health care provider's instructions for how to take your blood pressure. To use this form:  Get one reading in the morning (a.m.) before you take any medicines.  Get one reading in the evening (p.m.) before supper.  Take at least 2 readings with each blood pressure check. This makes sure the results are correct. Wait 1-2 minutes between measurements.  Write down the results in the spaces on this form.  Repeat this once a week, or as told by your health care provider.  Make a follow-up appointment with your health care provider to discuss the results. Blood pressure log Date: _______________________  a.m. _____________________(1st reading) _____________________(2nd reading)  p.m. _____________________(1st reading) _____________________(2nd reading) Date: _______________________  a.m. _____________________(1st reading) _____________________(2nd reading)  p.m. _____________________(1st reading) _____________________(2nd reading) Date: _______________________  a.m. _____________________(1st reading) _____________________(2nd reading)  p.m. _____________________(1st reading) _____________________(2nd  reading) Date: _______________________  a.m. _____________________(1st reading) _____________________(2nd reading)  p.m. _____________________(1st reading) _____________________(2nd reading) Date: _______________________  a.m. _____________________(1st reading) _____________________(2nd reading)  p.m. _____________________(1st reading) _____________________(2nd reading) This information is not intended to replace advice given to you by your health care provider. Make sure you discuss any questions you have with your health care provider. Document Released: 10/02/2002 Document Revised: 03/03/2017 Document Reviewed: 01/03/2017 Elsevier Patient Education  2020 [employer].

## 2018-08-13 NOTE — Therapy (Addendum)
[facility] [address] [place], [place], [address] Phone: [phone]   Fax:  [phone]  Physical Therapy Treatment and 10th visit Progress Note  Patient Details  Name: [name] MRN: [id number] Date of Birth: 06/15/1927 Referring Provider (PT): John Griffin, MD  CLINIC OPERATION CHANGES: Outpatient Neuro Rehab is open at lower capacity following universal masking, social distancing, and patient screening.  The patient's COVID risk of complications score is 4.   Encounter Date: 08/13/2018   Progress Note Reporting Period 06/18/2018 to 08/13/2018  See note below for Objective Data and Assessment of Progress/Goals.    PT End of Session - 08/13/18 1456    Visit Number  10    Number of Visits  17    Date for PT Re-Evaluation  09/14/18   original POC: 08/03/18   Authorization Type  [employer] Medicare - $20 copay.  10th visit PN    PT Start Time  1404    PT Stop Time  1448    PT Time Calculation (min)  44 min    Equipment Utilized During Treatment  Other (comment)   min guard to S   Activity Tolerance  Patient tolerated treatment well    Behavior During Therapy  WFL for tasks assessed/performed       Past Medical History:  Diagnosis Date  . Abnormal Pap smear 1975-76  . Anemia    history of  . Arthritis    spine  . Breast cyst, left 1980  . Cystocele 2012  . Diverticulosis    with h/o Diverticulitis  . GERD (gastroesophageal reflux disease)   . H/O hypercholesterolemia   . H/O osteopenia   . H/O varicella   . H/O varicose veins   . Hx of Mumps   . Macular degeneration   . Seasonal allergies   . Urge incontinence 2012  . Urinary frequency 2010    Past Surgical History:  Procedure Laterality Date  . ABDOMINAL HYSTERECTOMY    . BLADDER SUSPENSION N/A 06/16/2016   Procedure: TRANSVAGINAL TAPE (TVT) PROCEDURE;  Surgeon: Roberts, Angela, MD;  Location: [facility];  Service: Gynecology;  Laterality: N/A;  .  BREAST CYST ASPIRATION  1964  . COLONOSCOPY     Dr. Medoff  . CYSTOCELE REPAIR N/A 06/16/2016   Procedure: ANTERIOR REPAIR (CYSTOCELE);  Surgeon: Roberts, Angela, MD;  Location: [facility];  Service: Gynecology;  Laterality: N/A;  . CYSTOSCOPY N/A 06/16/2016   Procedure: CYSTOSCOPY;  Surgeon: Roberts, Angela, MD;  Location: [facility];  Service: Gynecology;  Laterality: N/A;  see anterior repair  . Lower Extremity Venous Dopplers  01/09/2012   Right and left lower steroids: No evidence of thrombus or, thrombophlebitis; right and left GSV and SSV: No venous insufficiency. Normal exam.  . NM MYOVIEW LTD  11/2016   6.6 METS.  Reached 106% of max.  Heart rate.  Walk for 4:40 min.  EF 72%.  Normal blood pressure response.  upsloping ST segment depression, nonspecific.  Otherwise normal study.  LOW RISK.  No evidence of ischemia or infarction.  . TRANSTHORACIC ECHOCARDIOGRAM  01/2018   EF > 65%. LV size small. Gr 1 DD (normal for age).   Normal atrial size. Moderate Aortic Sclerosis without Stenosis. Moderate Mitral Annular Calcification with mild MR & no MS    There were no vitals filed for this visit.  Subjective Assessment - 08/13/18 1405    Subjective  Still feels a little bit wobbly when she walk. Can't do the   horizontal head turn 'A' exercise for more than a minute. No falls. Notices that she weaves to the L when she walks.    Pertinent History  TIA, Stage 3 CKD, urinary frequency and incontinence, macular degeneration, osteopenia, hypercholesterolemia, GERD, diverticulosis, IBS, stable angina pectoris, cystocele with repair, OA, anemia    Patient Stated Goals  To help her get her balance back and be able to walk 2 miles on a trail each day                        Vestibular Treatment/Exercise - 08/13/18 1452      Vestibular Treatment/Exercise   Vestibular Treatment Provided  Gaze    Habituation Exercises  Standing Vertical Head Turns;Standing Horizontal Head Turns    Gaze  Exercises  X1 Viewing Horizontal;X1 Viewing Vertical      X1 Viewing Horizontal   Foot Position  standing with back supported on wall and UE support on back of chair, feet apart    Reps  3    Comments  45 seconds. Cueing for patient to turn head to right, when patient fatigues she tends to stop at midline      X1 Viewing Vertical   Foot Position  standing first with back supported on wall, feet apart    Reps  3    Comments  45 seconds        Revised/new additions to HEP: -Standing eyes closed in corner w/ feet together with head turns horizontally/vertically: 3 x 10 reps bilaterally  -Tandem walking at countertop x2 reps with fingertip support    Access Code: [email or web address]  URL: [email or web address]  Date: 08/13/2018  Prepared by: Janann August   Exercises -Tandem Stance with Support - 3 sets - 30 hold - 1x daily - 5x weekly --> in corner with hand support on chair. 3 x 30 seconds bilaterally Attempted backwards walking at counter top; pt unable to sequence/perform safely without min guard from therapist, downgraded to backward stepping for HEP  -Backward Step, Back to Center - 10 reps - 2 sets - 1x daily - 5x weekly --> revised from backward walking to backward step bilaterally. Verbal and visual demonstration provided.  2 x 10 reps.  -Standing Marching - 1x daily - 5x weekly --> at edge of counter top walking up and down counter. 4 reps down and back.     PT Education - 08/13/18 1456    Education Details  additions to HEP    Person(s) Educated  Patient    Methods  Explanation;Handout    Comprehension  Verbalized understanding;Returned demonstration       PT Short Term Goals - 07/19/18 1319      PT SHORT TERM GOAL #1   Title  Pt will participate in further assessment of balance impairments during gait (FGA and gait velocity)    Time  3    Period  Weeks    Status  Achieved    Target Date  07/09/18      PT SHORT TERM GOAL #2   Title  Pt will demonstrate  independence with initial vestibular/balance HEP    Time  3    Period  Weeks    Status  Achieved    Target Date  07/16/18      PT SHORT TERM GOAL #3   Title  Pt will demonstrate decreased falls risk as indicated by decrease in TUG to </= 17 seconds with improved balance during L and R turns  Baseline  19.34 seconds > 13.88 and 11.44 seconds for L and R turns    Time  3    Period  Weeks    Status  Achieved    Target Date  07/16/18      PT SHORT TERM GOAL #4   Title  Pt will report ability to walk 2000' outside over uneven pavement MOD I without LOB during quick turns    Baseline  1000' with supervision    Time  3    Period  Weeks    Status  Partially Met    Target Date  07/16/18      PT SHORT TERM GOAL #5   Title  Pt will demonstrate ability to reach down to the floor to retrieve items safely x 5 reps with supervision    Time  3    Period  Weeks    Status  Achieved    Target Date  07/16/18        PT Long Term Goals - 08/03/18 1349      PT LONG TERM GOAL #1   Title  Pt will demonstrate independence with final HEP and will report returning to 2 mile walk on trail  (LTG due by 7/17) ALL UNMET GOALS WILL BE CARRIED OVER TO NEW POC: 09/14/18    Baseline  pt not comfortable performing tandem or backwards walking HEP, has not attempted trail walk 08/03/18    Time  6    Period  Weeks    Status  Partially Met      PT LONG TERM GOAL #2   Title  Pt will improve safety with transfers/turns as indicated by decrease in TUG to </= 15 seconds    Baseline  10.93 sec.    Time  6    Period  Weeks    Status  Achieved      PT LONG TERM GOAL #3   Title  Pt will improve FGA to >/= 19/30 to indicate decreased falls risk in community    Baseline  18/30 on 08/03/18, revised from 19/30to 22/30 due to progress    Time  6    Period  Weeks    Status  Revised      PT LONG TERM GOAL #4   Title  Pt gait velocity will increase to >/= 3.0 ft/sec    Baseline  3.03 ft/sec    Time  6    Period   Weeks    Status  Achieved      PT LONG TERM GOAL #5   Title  Pt will ambulate x 3000' outside over uneven pavement MOD I with no LOB during quick turns to L and R    Baseline  1500' with min guard to S    Time  6    Period  Weeks    Status  Partially Met            Plan - 08/13/18 1505    Clinical Impression Statement  Reviewed prior HEP and increased difficulty of prior exercises and new additions for dynamic balance. Pt now able to perform standing feet together and EC with vertical/horizontal head turns in the corner, w/ mild reported dizziness. Pt had difficulty perform backwards walking even with frequent verbal cueing and demonstration, downgraded HEP exercise to backward stepping. Cueing needed for upright posture during exercises. Will continue to progress towards LTGs.    Personal Factors and Comorbidities  Age;Comorbidity 3+    Comorbidities  TIA, Stage   3 CKD, urinary frequency and incontinence, macular degeneration, osteopenia, hypercholesterolemia, GERD, diverticulosis, IBS, stable angina pectoris, cystocele with repair, OA, anemia    Examination-Activity Limitations  Bend;Locomotion Level;Reach Overhead    Examination-Participation Restrictions  Community Activity    Stability/Clinical Decision Making  Stable/Uncomplicated    Rehab Potential  Good    PT Frequency  2x / week    PT Duration  4 weeks   requesting additional 2x/week for 4 weeks (over a 6 week period due to scheduling is limited)   PT Treatment/Interventions  ADLs/Self Care Home Management;Canalith Repostioning;Gait training;Functional mobility training;Therapeutic activities;Therapeutic exercise;Balance training;Neuromuscular re-education;Patient/family education;Vestibular    PT Next Visit Plan  High level balance over compliant surfaces. Make sure pt is performing new HEP correctly. Dynamic gait with head turns. Practice picking up objects off floor.    PT Home Exercise Plan  [email or web address]    Consulted and Agree  with Plan of Care  Patient       Patient will benefit from skilled therapeutic intervention in order to improve the following deficits and impairments:  Decreased balance, Difficulty walking, Dizziness  Visit Diagnosis: 1. Unsteadiness on feet   2. Difficulty in walking, not elsewhere classified   3. Repeated falls   4. Dizziness and giddiness        Problem List Patient Active Problem List   Diagnosis Date Noted  . Palpitations 05/30/2018  . Irregular heartbeat 03/02/2018  . TIA (transient ischemic attack) 02/15/2018  . Urinary, incontinence, stress female 06/16/2016  . Right carotid bruit 12/23/2014  . Exertional dyspnea 12/24/2013  . Bilateral arm numbness and tingling while sleeping - and shortly after waking 12/24/2013  . Poor balance 12/24/2013  . Edema of left lower extremity 12/21/2012  . Essential hypertension; borderline   . H/O hypercholesterolemia   . Osteoporosis - of the spine 09/22/2011    Arliss Journey, PT, DPT 08/13/2018, 3:14 PM  [facility] [address] [place], [place], [address] Phone: [phone]   Fax:  [phone]  Name: [name] MRN: [id number] Date of Birth: 1928/01/06

## 2018-08-17 ENCOUNTER — Other Ambulatory Visit: Payer: Self-pay

## 2018-08-17 ENCOUNTER — Ambulatory Visit: Payer: Medicare Other | Admitting: Physical Therapy

## 2018-08-17 ENCOUNTER — Encounter: Payer: Self-pay | Admitting: Physical Therapy

## 2018-08-17 DIAGNOSIS — R2681 Unsteadiness on feet: Secondary | ICD-10-CM

## 2018-08-17 DIAGNOSIS — R42 Dizziness and giddiness: Secondary | ICD-10-CM | POA: Diagnosis not present

## 2018-08-17 DIAGNOSIS — R296 Repeated falls: Secondary | ICD-10-CM

## 2018-08-17 DIAGNOSIS — R262 Difficulty in walking, not elsewhere classified: Secondary | ICD-10-CM

## 2018-08-17 NOTE — Therapy (Signed)
[facility] [address] [place], [place], [address] Phone: [phone]   Fax:  [phone]  Physical Therapy Treatment  Patient Details  Name: [name] MRN: [id number] Date of Birth: [birth year] Referring Provider (PT): Lavone Orn, MD   Encounter Date: 08/17/2018   CLINIC OPERATION CHANGES: Outpatient Neuro Rehab is open at lower capacity following universal masking, social distancing, and patient screening.  The patient's COVID risk of complications score is 4.   PT End of Session - 08/17/18 1355    Visit Number  11    Number of Visits  17    Date for PT Re-Evaluation  09/14/18    Authorization Type  [employer] Medicare - $20 copay.  10th visit PN    PT Start Time  1300    PT Stop Time  1345    PT Time Calculation (min)  45 min    Equipment Utilized During Treatment  --    Activity Tolerance  Patient tolerated treatment well    Behavior During Therapy  WFL for tasks assessed/performed       Past Medical History:  Diagnosis Date  . Abnormal Pap smear 1975-76  . Anemia    history of  . Arthritis    spine  . Breast cyst, left 1980  . Cystocele 2012  . Diverticulosis    with h/o Diverticulitis  . GERD (gastroesophageal reflux disease)   . H/O hypercholesterolemia   . H/O osteopenia   . H/O varicella   . H/O varicose veins   . Hx of Mumps   . Macular degeneration   . Seasonal allergies   . Urge incontinence 2012  . Urinary frequency 2010    Past Surgical History:  Procedure Laterality Date  . ABDOMINAL HYSTERECTOMY    . BLADDER SUSPENSION N/A 06/16/2016   Procedure: TRANSVAGINAL TAPE (TVT) PROCEDURE;  Surgeon: Everett Graff, MD;  Location: [facility];  Service: Gynecology;  Laterality: N/A;  . BREAST CYST ASPIRATION  1964  . COLONOSCOPY     Dr. Earlean Shawl  . CYSTOCELE REPAIR N/A 06/16/2016   Procedure: ANTERIOR REPAIR (CYSTOCELE);  Surgeon: Everett Graff, MD;  Location: [facility];  Service: Gynecology;   Laterality: N/A;  . CYSTOSCOPY N/A 06/16/2016   Procedure: CYSTOSCOPY;  Surgeon: Everett Graff, MD;  Location: [facility];  Service: Gynecology;  Laterality: N/A;  see anterior repair  . Lower Extremity Venous Dopplers  01/09/2012   Right and left lower steroids: No evidence of thrombus or, thrombophlebitis; right and left GSV and SSV: No venous insufficiency. Normal exam.  . NM MYOVIEW LTD  11/2016   6.6 METS.  Reached 106% of max.  Heart rate.  Walk for 4:40 min.  EF 72%.  Normal blood pressure response.  upsloping ST segment depression, nonspecific.  Otherwise normal study.  LOW RISK.  No evidence of ischemia or infarction.  . TRANSTHORACIC ECHOCARDIOGRAM  01/2018   EF > 65%. LV size small. Gr 1 DD (normal for age).   Normal atrial size. Moderate Aortic Sclerosis without Stenosis. Moderate Mitral Annular Calcification with mild MR & no MS    There were no vitals filed for this visit.  Subjective Assessment - 08/17/18 1304    Subjective  Mild low back pain today but nothing abnormal.  No dizziness the past few days.  Has been working on her exercises and can tell her balance is better - can reach down further without LOB.  Still having a hard time with standing on one leg  to put pants on.    Pertinent History  TIA, Stage 3 CKD, urinary frequency and incontinence, macular degeneration, osteopenia, hypercholesterolemia, GERD, diverticulosis, IBS, stable angina pectoris, cystocele with repair, OA, anemia    Patient Stated Goals  To help her get her balance back and be able to walk 2 miles on a trail each day    Currently in Pain?  No/denies                       [facility] Adult PT Treatment/Exercise - 08/17/18 1311      Transfers   Transfers  Floor to Transfer    Floor to Transfer  5: Supervision;With upper extremity assist    Floor to Transfer Details (indicate cue type and reason)  x 3 reps from side sitting > quadruped > tall kneeling > half kneeling > stand      High Level  Balance   High Level Balance Activities  Other (comment)    High Level Balance Comments  Tall and half kneeling on the floor.  In R and L half kneeling without UE support performed 10 reps each head turns, head nods and eyes closed x 10 seconds      Therapeutic Activites    Therapeutic Activities  ADL's    ADL's  With circular yellow theraband practiced standing on one leg and reaching down to loop band around one leg and then remove.  Performed x 5 reps on each side with supervision-min A when pt would experience lateral LOB.  Pt would intermittently place one hand on mat for support.       Vestibular Treatment/Exercise - 08/17/18 1340      Vestibular Treatment/Exercise   Vestibular Treatment Provided  Gaze    Gaze Exercises  X1 Viewing Horizontal;X1 Viewing Vertical      X1 Viewing Horizontal   Foot Position  standing with back against wall but no UE support    Reps  1    Comments  increased to 60 seconds      X1 Viewing Vertical   Foot Position  standing with back against wall but no UE support    Reps  1    Comments  increased to 60 seconds            PT Education - 08/17/18 1354    Education Details  updated x1 viewing to 60 seconds; floor > stand transfers safely; donning pants standing on one leg safely    Person(s) Educated  Patient    Methods  Explanation;Demonstration    Comprehension  Verbalized understanding;Returned demonstration       PT Short Term Goals - 08/17/18 1438      PT SHORT TERM GOAL #1   Title  = LTG        PT Long Term Goals - 08/17/18 1438      PT LONG TERM GOAL #1   Title  Pt will demonstrate independence with final HEP and will report returning to 2 mile walk on trail  (LTG due by 09/14/18)    Baseline  pt not comfortable performing tandem or backwards walking HEP, has not attempted trail walk 08/03/18    Time  6    Period  Weeks    Status  Partially Met      PT LONG TERM GOAL #3   Title  Pt will improve FGA to >/= 22/30 to indicate  decreased falls risk in community    Baseline  18/30 on  08/03/18    Time  6    Period  Weeks    Status  Revised      PT LONG TERM GOAL #4   Title  Pt gait velocity will increase to >/= 3.2 ft/sec    Baseline  3.03 ft/sec    Time  6    Period  Weeks    Status  Revised      PT LONG TERM GOAL #5   Title  Pt will ambulate x 3000' outside over uneven pavement MOD I with no LOB during quick turns to L and R    Baseline  1500' with min guard to S    Time  6    Period  Weeks    Status  Partially Met            Plan - 08/17/18 1355    Clinical Impression Statement  Treatment session focused on proximal hip strengthening and balance in half kneeling position and then combining SLS with functional task of donning simulated pants in standing.  Performed training for safe floor > stand transfers focusing on weight shifting forwards to stand in half kneeling and continued to progress x 1 viewing.  Pt now tolerating 60 seconds of gaze adaptation without UE support but still requires support of wall for feedback to prevent whole trunk from rotating side to side and up/down.  Pt tolerated well but did have slight increase in BP and SOB with floor activities.  Will continue to address in order to progress towards LTG.    Personal Factors and Comorbidities  Age;Comorbidity 3+    Comorbidities  TIA, Stage 3 CKD, urinary frequency and incontinence, macular degeneration, osteopenia, hypercholesterolemia, GERD, diverticulosis, IBS, stable angina pectoris, cystocele with repair, OA, anemia    Examination-Activity Limitations  Bend;Locomotion Level;Reach Overhead    Examination-Participation Restrictions  Community Activity    Stability/Clinical Decision Making  Stable/Uncomplicated    Rehab Potential  Good    PT Frequency  2x / week    PT Duration  4 weeks   requesting additional 2x/week for 4 weeks (over a 6 week period due to scheduling is limited)   PT Treatment/Interventions  ADLs/Self Care Home  Management;Canalith Repostioning;Gait training;Functional mobility training;Therapeutic activities;Therapeutic exercise;Balance training;Neuromuscular re-education;Patient/family education;Vestibular    PT Next Visit Plan  Floor > stand transfers, half kneeling on floor with balance challenges (head turns, EC), SLS training.  Gait on compliant surfaces, head turns.    PT Home Exercise Plan  [email or web address]    Consulted and Agree with Plan of Care  Patient       Patient will benefit from skilled therapeutic intervention in order to improve the following deficits and impairments:  Decreased balance, Difficulty walking, Dizziness  Visit Diagnosis: 1. Unsteadiness on feet   2. Difficulty in walking, not elsewhere classified   3. Repeated falls        Problem List Patient Active Problem List   Diagnosis Date Noted  . Palpitations 05/30/2018  . Irregular heartbeat 03/02/2018  . TIA (transient ischemic attack) 02/15/2018  . Urinary, incontinence, stress female 06/16/2016  . Right carotid bruit 12/23/2014  . Exertional dyspnea 12/24/2013  . Bilateral arm numbness and tingling while sleeping - and shortly after waking 12/24/2013  . Poor balance 12/24/2013  . Edema of left lower extremity 12/21/2012  . Essential hypertension; borderline   . H/O hypercholesterolemia   . Osteoporosis - of the spine 09/22/2011    Rico Junker, PT, DPT 08/17/18  2:40 PM    [facility] [address] [place], [place], [address] Phone: [phone]   Fax:  [phone]  Name: [name] MRN: [id number] Date of Birth: February 01, 1927

## 2018-08-24 ENCOUNTER — Ambulatory Visit: Payer: Medicare Other | Attending: Internal Medicine | Admitting: Physical Therapy

## 2018-08-24 ENCOUNTER — Encounter: Payer: Self-pay | Admitting: Physical Therapy

## 2018-08-24 ENCOUNTER — Other Ambulatory Visit: Payer: Self-pay

## 2018-08-24 VITALS — BP 129/77 | HR 102

## 2018-08-24 DIAGNOSIS — R262 Difficulty in walking, not elsewhere classified: Secondary | ICD-10-CM | POA: Diagnosis present

## 2018-08-24 DIAGNOSIS — R42 Dizziness and giddiness: Secondary | ICD-10-CM | POA: Diagnosis present

## 2018-08-24 DIAGNOSIS — R2681 Unsteadiness on feet: Secondary | ICD-10-CM | POA: Diagnosis not present

## 2018-08-24 DIAGNOSIS — R296 Repeated falls: Secondary | ICD-10-CM | POA: Insufficient documentation

## 2018-08-24 NOTE — Patient Instructions (Signed)
Access Code: [email or web address]  URL: [email or web address]  Date: 08/24/2018  Prepared by: Misty Stanley   Exercises Standing Shoulder External Rotation Stretch in Doorway - 2 sets - 30 second hold - 1x daily - 7x weekly

## 2018-08-24 NOTE — Therapy (Signed)
[facility] [address] [place], [place], [address] Phone: [phone]   Fax:  [phone]  Physical Therapy Treatment  Patient Details  Name: [name] MRN: [id number] Date of Birth: Sep 30, 1927 Referring Provider (PT): Lavone Orn, MD   Encounter Date: 08/24/2018  PT End of Session - 08/24/18 1702    Visit Number  12    Number of Visits  17    Date for PT Re-Evaluation  09/14/18    Authorization Type  [employer] Medicare - $20 copay.  10th visit PN    PT Start Time  1400    PT Stop Time  1450    PT Time Calculation (min)  50 min    Activity Tolerance  Patient tolerated treatment well    Behavior During Therapy  WFL for tasks assessed/performed       Past Medical History:  Diagnosis Date  . Abnormal Pap smear 1975-76  . Anemia    history of  . Arthritis    spine  . Breast cyst, left 1980  . Cystocele 2012  . Diverticulosis    with h/o Diverticulitis  . GERD (gastroesophageal reflux disease)   . H/O hypercholesterolemia   . H/O osteopenia   . H/O varicella   . H/O varicose veins   . Hx of Mumps   . Macular degeneration   . Seasonal allergies   . Urge incontinence 2012  . Urinary frequency 2010    Past Surgical History:  Procedure Laterality Date  . ABDOMINAL HYSTERECTOMY    . BLADDER SUSPENSION N/A 06/16/2016   Procedure: TRANSVAGINAL TAPE (TVT) PROCEDURE;  Surgeon: Everett Graff, MD;  Location: [facility];  Service: Gynecology;  Laterality: N/A;  . BREAST CYST ASPIRATION  1964  . COLONOSCOPY     Dr. Earlean Shawl  . CYSTOCELE REPAIR N/A 06/16/2016   Procedure: ANTERIOR REPAIR (CYSTOCELE);  Surgeon: Everett Graff, MD;  Location: [facility];  Service: Gynecology;  Laterality: N/A;  . CYSTOSCOPY N/A 06/16/2016   Procedure: CYSTOSCOPY;  Surgeon: Everett Graff, MD;  Location: [facility];  Service: Gynecology;  Laterality: N/A;  see anterior repair  . Lower Extremity Venous Dopplers  01/09/2012   Right and left lower  steroids: No evidence of thrombus or, thrombophlebitis; right and left GSV and SSV: No venous insufficiency. Normal exam.  . NM MYOVIEW LTD  11/2016   6.6 METS.  Reached 106% of max.  Heart rate.  Walk for 4:40 min.  EF 72%.  Normal blood pressure response.  upsloping ST segment depression, nonspecific.  Otherwise normal study.  LOW RISK.  No evidence of ischemia or infarction.  . TRANSTHORACIC ECHOCARDIOGRAM  01/2018   EF > 65%. LV size small. Gr 1 DD (normal for age).   Normal atrial size. Moderate Aortic Sclerosis without Stenosis. Moderate Mitral Annular Calcification with mild MR & no MS    Vitals:   08/24/18 1410  BP: 129/77  Pulse: (!) 102    Subjective Assessment - 08/24/18 1408    Subjective  Pt felt woozy on Wednesday, took a Tylenol and took a nap and felt better.  Pt is very tired today.    Pertinent History  TIA, Stage 3 CKD, urinary frequency and incontinence, macular degeneration, osteopenia, hypercholesterolemia, GERD, diverticulosis, IBS, stable angina pectoris, cystocele with repair, OA, anemia    Patient Stated Goals  To help her get her balance back and be able to walk 2 miles on a trail each day    Currently in Pain?  No/denies                       [facility] Adult PT Treatment/Exercise - 08/24/18 1707      Exercises   Exercises  Shoulder      Shoulder Exercises: Stretch   Wall Stretch - ABduction  2 reps;30 seconds    Wall Stretch - ABduction Limitations  horizontal ABD with rotation away from arm and neck rotation away from arm      Vestibular Treatment/Exercise - 08/24/18 1439      Vestibular Treatment/Exercise   Vestibular Treatment Provided  Gaze;Habituation    Habituation Exercises  Standing Horizontal Head Turns;Standing Vertical Head Turns    Gaze Exercises  X1 Viewing Horizontal;X1 Viewing Vertical      Standing Horizontal Head Turns   Number of Reps   10    Symptom Description   feet apart with laser pointer on forehead tracing door  frame and focusing on isolated vertical head movement.   Performed another set but with UE performing extension and ER holding resistance with red theraband for more upright posture and proximal stability      Standing Vertical Head Turns   Number of Reps   10    Symptom Description   feet apart with laser pointer on forehead tracing door frame and focusing on isolated vertical head movement.   Performed another set but with UE performing extension and ER holding resistance with red theraband for more upright posture and proximal stability      X1 Viewing Horizontal   Foot Position  standing with feet apart without UE support and then with UE performing shoulder extension and ER against resistance of anchored red theraband    Reps  2    Comments  60 seconds      X1 Viewing Vertical   Foot Position  standing with feet apart without UE support and then with UE performing shoulder extension and ER against resistance of anchored red theraband    Reps  2    Comments  60 seconds            PT Education - 08/24/18 1701    Education Details  continued to work on proximal stability and trunk control during x1 viewing, anterior chest and neck stretch for rotation    Person(s) Educated  Patient    Methods  Explanation;Demonstration;Handout    Comprehension  Verbalized understanding;Returned demonstration       PT Short Term Goals - 08/17/18 1438      PT SHORT TERM GOAL #1   Title  = LTG        PT Long Term Goals - 08/17/18 1438      PT LONG TERM GOAL #1   Title  Pt will demonstrate independence with final HEP and will report returning to 2 mile walk on trail  (LTG due by 09/14/18)    Baseline  pt not comfortable performing tandem or backwards walking HEP, has not attempted trail walk 08/03/18    Time  6    Period  Weeks    Status  Partially Met      PT LONG TERM GOAL #3   Title  Pt will improve FGA to >/= 22/30 to indicate decreased falls risk in community    Baseline  18/30 on  08/03/18    Time  6    Period  Weeks    Status  Revised      PT LONG TERM GOAL #  4   Title  Pt gait velocity will increase to >/= 3.2 ft/sec    Baseline  3.03 ft/sec    Time  6    Period  Weeks    Status  Revised      PT LONG TERM GOAL #5   Title  Pt will ambulate x 3000' outside over uneven pavement MOD I with no LOB during quick turns to L and R    Baseline  1500' with min guard to S    Time  6    Period  Weeks    Status  Partially Met            Plan - 08/24/18 1702    Clinical Impression Statement  Pt reporting feeling very fatigued today; vitals assessed with BP and HR WNL.  Pt agreeable to continue with session.  Treatment session continued to focus on gaze adaptation and progressing away from use of wall and chair for balance and feedback on trunk position and trunk control.  Pt demonstrates ability to perform without UE support but continues to demonstrate rotation or flexion of trunk during x1 viewing.  Transitioned to using laser on forehead and theraband resistance for UE extension and ER to improve proximal stability and more isolated movement of head and neck.  Also provided pt with anterior chest and neck stretch to allow for greater dissociation between neck and trunk.  Will continue to address in order to progress towards LTG.    Personal Factors and Comorbidities  Age;Comorbidity 3+    Comorbidities  TIA, Stage 3 CKD, urinary frequency and incontinence, macular degeneration, osteopenia, hypercholesterolemia, GERD, diverticulosis, IBS, stable angina pectoris, cystocele with repair, OA, anemia    Examination-Activity Limitations  Bend;Locomotion Level;Reach Overhead    Examination-Participation Restrictions  Community Activity    Stability/Clinical Decision Making  Stable/Uncomplicated    Rehab Potential  Good    PT Frequency  2x / week    PT Duration  4 weeks   requesting additional 2x/week for 4 weeks (over a 6 week period due to scheduling is limited)   PT  Treatment/Interventions  ADLs/Self Care Home Management;Canalith Repostioning;Gait training;Functional mobility training;Therapeutic activities;Therapeutic exercise;Balance training;Neuromuscular re-education;Patient/family education;Vestibular    PT Next Visit Plan  progress x1 viewing with decreased trunk rotation.  Rockerboard weight shifting.  Floor > stand transfers, half kneeling on floor with balance challenges (head turns, EC), SLS training.  Gait on compliant surfaces, head turns.    PT Home Exercise Plan  [id number]    Consulted and Agree with Plan of Care  Patient       Patient will benefit from skilled therapeutic intervention in order to improve the following deficits and impairments:  Decreased balance, Difficulty walking, Dizziness  Visit Diagnosis: 1. Unsteadiness on feet   2. Difficulty in walking, not elsewhere classified   3. Repeated falls   4. Dizziness and giddiness        Problem List Patient Active Problem List   Diagnosis Date Noted  . Palpitations 05/30/2018  . Irregular heartbeat 03/02/2018  . TIA (transient ischemic attack) 02/15/2018  . Urinary, incontinence, stress female 06/16/2016  . Right carotid bruit 12/23/2014  . Exertional dyspnea 12/24/2013  . Bilateral arm numbness and tingling while sleeping - and shortly after waking 12/24/2013  . Poor balance 12/24/2013  . Edema of left lower extremity 12/21/2012  . Essential hypertension; borderline   . H/O hypercholesterolemia   . Osteoporosis - of the spine 09/22/2011    Rico Junker,  PT, DPT 08/24/18    5:09 PM    [facility] [address] [place], [place], [address] Phone: [phone]   Fax:  [phone]  Name: [name] MRN: [id number] Date of Birth: 03/29/1927

## 2018-09-03 ENCOUNTER — Ambulatory Visit: Payer: Medicare Other | Admitting: Physical Therapy

## 2018-09-03 ENCOUNTER — Other Ambulatory Visit: Payer: Self-pay

## 2018-09-03 ENCOUNTER — Encounter: Payer: Self-pay | Admitting: Physical Therapy

## 2018-09-03 DIAGNOSIS — R262 Difficulty in walking, not elsewhere classified: Secondary | ICD-10-CM

## 2018-09-03 DIAGNOSIS — R296 Repeated falls: Secondary | ICD-10-CM

## 2018-09-03 DIAGNOSIS — R2681 Unsteadiness on feet: Secondary | ICD-10-CM | POA: Diagnosis not present

## 2018-09-03 NOTE — Patient Instructions (Addendum)
Gaze Stabilization - Tip Card  1.Target must remain in focus, not blurry, and appear stationary while head is in motion. 2.Perform exercises with small head movements (45 to either side of midline). 3.Increase speed of head motion so long as target is in focus. 4.If you wear eyeglasses, be sure you can see target through lens (therapist will give specific instructions for bifocal / progressive lenses). 5.These exercises may provoke dizziness or nausea. Work through these symptoms. If too dizzy, slow head movement slightly. Rest between each exercise. 6.Exercises demand concentration; avoid distractions. 7.For safety, perform standing exercises close to a counter, wall, corner, or next to someone.  Copyright  VHI. All rights reserved.   Gaze Stabilization - Standing Feet Apart   Stand with your feet together, keeping eyes on target on wall 3 feet away, tilt head down slightly and move head side to side for 60 seconds. Repeat while moving head up and down for 60 seconds.  Do 2-3 sessions per day.    Feet Together, Eyes Closed - Head Movements, Head Up and Down, Head Side to Side (Saying No)     With eyes closed and feet together, nod head up and down and side to side. Perform 2 sets of 10 of each.    Feet Partial Heel-Toe, Head Motion - Eyes Open    With eyes open, right foot partially in front of the other, move head slowly: up and down 10 times, side to side 10 times. Switch feet and repeat 10 times side to side, 10 times up and down, eyes open  Do 2 sessions per day.   Feet Heel-Toe "Tandem"    With one hand touching your counter top: walk forwards in a straight line bringing one foot directly in front of the other.  When you reach the end of your counter top, walk backwards with a wider stance.  Repeat walking forwards heel-toe and then backwards (feet apart) to the starting position, 4 laps.Do _2___ sessions per day.   FUNCTIONAL MOBILITY: Walk Backward    After  heel-toe walking.  Feet apart.  Take large steps, do not drag feet.   Blood Pressure Record Sheet To take your blood pressure, you will need a blood pressure machine. You can buy a blood pressure machine (blood pressure monitor) at your clinic, drug store, or online. When choosing one, consider:  An automatic monitor that has an arm cuff.  A cuff that wraps snugly around your upper arm. You should be able to fit only one finger between your arm and the cuff.  A device that stores blood pressure reading results.  Do not choose a monitor that measures your blood pressure from your wrist or finger. Follow your health care provider's instructions for how to take your blood pressure. To use this form:  Get one reading in the morning (a.m.) before you take any medicines.  Get one reading in the evening (p.m.) before supper.  Take at least 2 readings with each blood pressure check. This makes sure the results are correct. Wait 1-2 minutes between measurements.  Write down the results in the spaces on this form.  Repeat this once a week, or as told by your health care provider.  Make a follow-up appointment with your health care provider to discuss the results. Blood pressure log Date: _______________________  a.m. _____________________(1st reading) _____________________(2nd reading)  p.m. _____________________(1st reading) _____________________(2nd reading) Date: _______________________  a.m. _____________________(1st reading) _____________________(2nd reading)  p.m. _____________________(1st reading) _____________________(2nd reading) Date: _______________________  a.m.  _____________________(1st reading) _____________________(2nd reading)  p.m. _____________________(1st reading) _____________________(2nd reading) Date: _______________________  a.m. _____________________(1st reading) _____________________(2nd reading)  p.m. _____________________(1st reading)  _____________________(2nd reading) Date: _______________________  a.m. _____________________(1st reading) _____________________(2nd reading)  p.m. _____________________(1st reading) _____________________(2nd reading) This information is not intended to replace advice given to you by your health care provider. Make sure you discuss any questions you have with your health care provider. Document Released: 10/02/2002 Document Revised: 03/03/2017 Document Reviewed: 01/03/2017 Elsevier Patient Education  2020 [employer].

## 2018-09-03 NOTE — Therapy (Signed)
[facility] [address] [place], [place], [address] Phone: [phone]   Fax:  [phone]  Physical Therapy Treatment  Patient Details  Name: [name] MRN: [id number] Date of Birth: 1927-05-22 Referring Provider (PT): Lavone Orn, MD   Encounter Date: 09/03/2018  PT End of Session - 09/03/18 1619    Visit Number  13    Number of Visits  17    Date for PT Re-Evaluation  09/14/18    Authorization Type  [employer] Medicare - $20 copay.  10th visit PN    PT Start Time  1532    PT Stop Time  1610    PT Time Calculation (min)  38 min    Activity Tolerance  Patient tolerated treatment well    Behavior During Therapy  WFL for tasks assessed/performed       Past Medical History:  Diagnosis Date  . Abnormal Pap smear 1975-76  . Anemia    history of  . Arthritis    spine  . Breast cyst, left 1980  . Cystocele 2012  . Diverticulosis    with h/o Diverticulitis  . GERD (gastroesophageal reflux disease)   . H/O hypercholesterolemia   . H/O osteopenia   . H/O varicella   . H/O varicose veins   . Hx of Mumps   . Macular degeneration   . Seasonal allergies   . Urge incontinence 2012  . Urinary frequency 2010    Past Surgical History:  Procedure Laterality Date  . ABDOMINAL HYSTERECTOMY    . BLADDER SUSPENSION N/A 06/16/2016   Procedure: TRANSVAGINAL TAPE (TVT) PROCEDURE;  Surgeon: Everett Graff, MD;  Location: [facility];  Service: Gynecology;  Laterality: N/A;  . BREAST CYST ASPIRATION  1964  . COLONOSCOPY     Dr. Earlean Shawl  . CYSTOCELE REPAIR N/A 06/16/2016   Procedure: ANTERIOR REPAIR (CYSTOCELE);  Surgeon: Everett Graff, MD;  Location: [facility];  Service: Gynecology;  Laterality: N/A;  . CYSTOSCOPY N/A 06/16/2016   Procedure: CYSTOSCOPY;  Surgeon: Everett Graff, MD;  Location: [facility];  Service: Gynecology;  Laterality: N/A;  see anterior repair  . Lower Extremity Venous Dopplers  01/09/2012   Right and left lower  steroids: No evidence of thrombus or, thrombophlebitis; right and left GSV and SSV: No venous insufficiency. Normal exam.  . NM MYOVIEW LTD  11/2016   6.6 METS.  Reached 106% of max.  Heart rate.  Walk for 4:40 min.  EF 72%.  Normal blood pressure response.  upsloping ST segment depression, nonspecific.  Otherwise normal study.  LOW RISK.  No evidence of ischemia or infarction.  . TRANSTHORACIC ECHOCARDIOGRAM  01/2018   EF > 65%. LV size small. Gr 1 DD (normal for age).   Normal atrial size. Moderate Aortic Sclerosis without Stenosis. Moderate Mitral Annular Calcification with mild MR & no MS    There were no vitals filed for this visit.  Subjective Assessment - 09/03/18 1535    Subjective  Did not have a therapy session last week due to lack of available appointment times.  Has been working on [employer].  Did not have any dizziness last week but still feels off balance.  BP has been good.    Pertinent History  TIA, Stage 3 CKD, urinary frequency and incontinence, macular degeneration, osteopenia, hypercholesterolemia, GERD, diverticulosis, IBS, stable angina pectoris, cystocele with repair, OA, anemia    Patient Stated Goals  To help her get her balance back and be able to walk 2  miles on a trail each day    Currently in Pain?  No/denies                       [facility] Adult PT Treatment/Exercise - 09/03/18 1550      Transfers   Transfers  Floor to Transfer    Floor to Transfer  5: Supervision;With upper extremity assist    Floor to Transfer Details (indicate cue type and reason)  x 2 reps from side sitting > quadruped > tall kneeling > half kneeling > stand      Shoulder Exercises: Stretch   Wall Stretch - ABduction  2 reps;30 seconds    Wall Stretch - ABduction Limitations  horizontal ABD with rotation away from arm and neck rotation away from arm      Vestibular Treatment/Exercise - 09/03/18 1551      Vestibular Treatment/Exercise   Vestibular Treatment Provided  Gaze     Gaze Exercises  X1 Viewing Horizontal;X1 Viewing Vertical      X1 Viewing Horizontal   Foot Position  standing with feet apart and then feet together; no UE support    Reps  3    Comments  60 seconds with no shoulder or body rotation       X1 Viewing Vertical   Foot Position  standing with feet apart and then feet together; no UE support    Reps  3    Comments  60 seconds         Balance Exercises - 09/03/18 1634      Balance Exercises: Standing   Standing Eyes Opened  Narrow base of support (BOS);Head turns;Solid surface;Other reps (comment)   staggered stance, 10 head turns/nods   Standing Eyes Closed  Narrow base of support (BOS);Head turns;Solid surface;Other reps (comment)   feet together, 10 head nods/turns       PT Education - 09/03/18 1617    Education Details  updated HEP, floor transfers    Person(s) Educated  Patient    Methods  Explanation;Demonstration;Handout    Comprehension  Verbalized understanding;Returned demonstration       PT Short Term Goals - 08/17/18 1438      PT SHORT TERM GOAL #1   Title  = LTG        PT Long Term Goals - 08/17/18 1438      PT LONG TERM GOAL #1   Title  Pt will demonstrate independence with final HEP and will report returning to 2 mile walk on trail  (LTG due by 09/14/18)    Baseline  pt not comfortable performing tandem or backwards walking HEP, has not attempted trail walk 08/03/18    Time  6    Period  Weeks    Status  Partially Met      PT LONG TERM GOAL #3   Title  Pt will improve FGA to >/= 22/30 to indicate decreased falls risk in community    Baseline  18/30 on 08/03/18    Time  6    Period  Weeks    Status  Revised      PT LONG TERM GOAL #4   Title  Pt gait velocity will increase to >/= 3.2 ft/sec    Baseline  3.03 ft/sec    Time  6    Period  Weeks    Status  Revised      PT LONG TERM GOAL #5   Title  Pt will ambulate x 3000'  outside over uneven pavement MOD I with no LOB during quick turns to L  and R    Baseline  1500' with min guard to S    Time  6    Period  Weeks    Status  Partially Met            Plan - 09/03/18 1619    Clinical Impression Statement  Pt demonstrates significant improvement in proximal stability during x1 viewing without UE support for 60 seconds.  Able to progress x 1 viewing to narrow BOS, feet together and maintain proximal stability.  Also able to progress corner balance due to improved balance with eyes closed.  Continued to review floor > stand transfers with pt performing x 2 with supervision with therapist continuing to review safe sequence.  Will continue to address in order to progress towards LTG.    Personal Factors and Comorbidities  Age;Comorbidity 3+    Comorbidities  TIA, Stage 3 CKD, urinary frequency and incontinence, macular degeneration, osteopenia, hypercholesterolemia, GERD, diverticulosis, IBS, stable angina pectoris, cystocele with repair, OA, anemia    Examination-Activity Limitations  Bend;Locomotion Level;Reach Overhead    Examination-Participation Restrictions  Community Activity    Stability/Clinical Decision Making  Stable/Uncomplicated    Rehab Potential  Good    PT Frequency  2x / week    PT Duration  4 weeks   requesting additional 2x/week for 4 weeks (over a 6 week period due to scheduling is limited)   PT Treatment/Interventions  ADLs/Self Care Home Management;Canalith Repostioning;Gait training;Functional mobility training;Therapeutic activities;Therapeutic exercise;Balance training;Neuromuscular re-education;Patient/family education;Vestibular    PT Next Visit Plan  progress corner balance.  Rockerboard weight shifting.  half kneeling on floor with balance challenges (head turns, EC), SLS training.  Gait on compliant surfaces/outside, head turns.    PT Home Exercise Plan  --    Consulted and Agree with Plan of Care  Patient       Patient will benefit from skilled therapeutic intervention in order to improve the  following deficits and impairments:  Decreased balance, Difficulty walking, Dizziness  Visit Diagnosis: 1. Unsteadiness on feet   2. Difficulty in walking, not elsewhere classified   3. Repeated falls        Problem List Patient Active Problem List   Diagnosis Date Noted  . Palpitations 05/30/2018  . Irregular heartbeat 03/02/2018  . TIA (transient ischemic attack) 02/15/2018  . Urinary, incontinence, stress female 06/16/2016  . Right carotid bruit 12/23/2014  . Exertional dyspnea 12/24/2013  . Bilateral arm numbness and tingling while sleeping - and shortly after waking 12/24/2013  . Poor balance 12/24/2013  . Edema of left lower extremity 12/21/2012  . Essential hypertension; borderline   . H/O hypercholesterolemia   . Osteoporosis - of the spine 09/22/2011    Rico Junker, PT, DPT 09/03/18    4:36 PM    [facility] [address] [place], [place], [address] Phone: [phone]   Fax:  [phone]  Name: [name] MRN: [id number] Date of Birth: 1927/08/19

## 2018-09-07 ENCOUNTER — Other Ambulatory Visit: Payer: Self-pay

## 2018-09-07 ENCOUNTER — Ambulatory Visit: Payer: Medicare Other | Admitting: Physical Therapy

## 2018-09-07 DIAGNOSIS — R2681 Unsteadiness on feet: Secondary | ICD-10-CM | POA: Diagnosis not present

## 2018-09-07 DIAGNOSIS — R296 Repeated falls: Secondary | ICD-10-CM

## 2018-09-07 DIAGNOSIS — R262 Difficulty in walking, not elsewhere classified: Secondary | ICD-10-CM

## 2018-09-07 DIAGNOSIS — R42 Dizziness and giddiness: Secondary | ICD-10-CM

## 2018-09-07 NOTE — Therapy (Signed)
[facility] [address] [place], [place], [address] Phone: [phone]   Fax:  [phone]  Physical Therapy Treatment  Patient Details  Name: [name] MRN: [id number] Date of Birth: 1928-01-13 Referring Provider (PT): Lavone Orn, MD   Encounter Date: 09/07/2018  PT End of Session - 09/07/18 2355    Visit Number  14    Number of Visits  17    Date for PT Re-Evaluation  09/14/18    Authorization Type  [employer] Medicare - $20 copay.  10th visit PN    PT Start Time  1407    PT Stop Time  1448    PT Time Calculation (min)  41 min    Activity Tolerance  Patient tolerated treatment well    Behavior During Therapy  WFL for tasks assessed/performed       Past Medical History:  Diagnosis Date  . Abnormal Pap smear 1975-76  . Anemia    history of  . Arthritis    spine  . Breast cyst, left 1980  . Cystocele 2012  . Diverticulosis    with h/o Diverticulitis  . GERD (gastroesophageal reflux disease)   . H/O hypercholesterolemia   . H/O osteopenia   . H/O varicella   . H/O varicose veins   . Hx of Mumps   . Macular degeneration   . Seasonal allergies   . Urge incontinence 2012  . Urinary frequency 2010    Past Surgical History:  Procedure Laterality Date  . ABDOMINAL HYSTERECTOMY    . BLADDER SUSPENSION N/A 06/16/2016   Procedure: TRANSVAGINAL TAPE (TVT) PROCEDURE;  Surgeon: Everett Graff, MD;  Location: [facility];  Service: Gynecology;  Laterality: N/A;  . BREAST CYST ASPIRATION  1964  . COLONOSCOPY     Dr. Earlean Shawl  . CYSTOCELE REPAIR N/A 06/16/2016   Procedure: ANTERIOR REPAIR (CYSTOCELE);  Surgeon: Everett Graff, MD;  Location: [facility];  Service: Gynecology;  Laterality: N/A;  . CYSTOSCOPY N/A 06/16/2016   Procedure: CYSTOSCOPY;  Surgeon: Everett Graff, MD;  Location: [facility];  Service: Gynecology;  Laterality: N/A;  see anterior repair  . Lower Extremity Venous Dopplers  01/09/2012   Right and left lower  steroids: No evidence of thrombus or, thrombophlebitis; right and left GSV and SSV: No venous insufficiency. Normal exam.  . NM MYOVIEW LTD  11/2016   6.6 METS.  Reached 106% of max.  Heart rate.  Walk for 4:40 min.  EF 72%.  Normal blood pressure response.  upsloping ST segment depression, nonspecific.  Otherwise normal study.  LOW RISK.  No evidence of ischemia or infarction.  . TRANSTHORACIC ECHOCARDIOGRAM  01/2018   EF > 65%. LV size small. Gr 1 DD (normal for age).   Normal atrial size. Moderate Aortic Sclerosis without Stenosis. Moderate Mitral Annular Calcification with mild MR & no MS    There were no vitals filed for this visit.  Subjective Assessment - 09/07/18 1414    Subjective  Doing well; still having some imbalance with reaching down to the floor.  Has some neck soreness from exercises.    Pertinent History  TIA, Stage 3 CKD, urinary frequency and incontinence, macular degeneration, osteopenia, hypercholesterolemia, GERD, diverticulosis, IBS, stable angina pectoris, cystocele with repair, OA, anemia    Patient Stated Goals  To help her get her balance back and be able to walk 2 miles on a trail each day    Currently in Pain?  No/denies  [facility] PT Treatment/Exercise - 09/07/18 1419      Therapeutic Activites    Other Therapeutic Activities  Reaching to the floor x 5 reaching for small cones with focus on wide stance for stability, and squat to keep COG centered over BOS and prevent tipping forwards.  Still feels slightly dizzy bending down to floor.      Exercises   Exercises  Ankle      Ankle Exercises: Stretches   Gastroc Stretch  2 reps;30 seconds    Gastroc Stretch Limitations  on sland board into [employer]          Balance Exercises - 09/07/18 1421      Balance Exercises: Standing   Standing Eyes Opened  Wide ([place]);Narrow base of support (BOS);Head turns;Foam/compliant surface;Other reps (comment)   10 reps; across blue foam  beam   Rockerboard  Anterior/posterior;EO;10 reps   squats keeping COG centered   Sidestepping  Foam/compliant support;4 reps   to L and R, blue foam beam       PT Education - 09/07/18 1554    Education Details  plan for final 2 visits    Person(s) Educated  Patient    Methods  Explanation    Comprehension  Verbalized understanding       PT Short Term Goals - 08/17/18 1438      PT SHORT TERM GOAL #1   Title  = LTG        PT Long Term Goals - 08/17/18 1438      PT LONG TERM GOAL #1   Title  Pt will demonstrate independence with final HEP and will report returning to 2 mile walk on trail  (LTG due by 09/14/18)    Baseline  pt not comfortable performing tandem or backwards walking HEP, has not attempted trail walk 08/03/18    Time  6    Period  Weeks    Status  Partially Met      PT LONG TERM GOAL #3   Title  Pt will improve FGA to >/= 22/30 to indicate decreased falls risk in community    Baseline  18/30 on 08/03/18    Time  6    Period  Weeks    Status  Revised      PT LONG TERM GOAL #4   Title  Pt gait velocity will increase to >/= 3.2 ft/sec    Baseline  3.03 ft/sec    Time  6    Period  Weeks    Status  Revised      PT LONG TERM GOAL #5   Title  Pt will ambulate x 3000' outside over uneven pavement MOD I with no LOB during quick turns to L and R    Baseline  1500' with min guard to S    Time  6    Period  Weeks    Status  Partially Met            Plan - 09/07/18 1555    Clinical Impression Statement  Incorporated ankle gastroc stretching to increase ankle DF ROM for squatting and balance reactions.  Continued to focus on ankle and hip strategies for balance when squatting and reaching to floor.  Pt continues to feel some dizziness when head is down but demonstrates safe technique for reaching to the floor.  Will begin to check LTG next week and finalize HEP for D/C.    Personal Factors and Comorbidities  Age;Comorbidity 3+    Comorbidities  TIA, Stage 3  CKD, urinary frequency and incontinence, macular degeneration, osteopenia, hypercholesterolemia, GERD, diverticulosis, IBS, stable angina pectoris, cystocele with repair, OA, anemia    Examination-Activity Limitations  Bend;Locomotion Level;Reach Overhead    Examination-Participation Restrictions  Community Activity    Stability/Clinical Decision Making  Stable/Uncomplicated    Rehab Potential  Good    PT Frequency  2x / week    PT Duration  4 weeks   requesting additional 2x/week for 4 weeks (over a 6 week period due to scheduling is limited)   PT Treatment/Interventions  ADLs/Self Care Home Management;Canalith Repostioning;Gait training;Functional mobility training;Therapeutic activities;Therapeutic exercise;Balance training;Neuromuscular re-education;Patient/family education;Vestibular    PT Next Visit Plan  check LTG, finalize D/C, ankle gastroc stretch, squat, reaching to floor for items    Consulted and Agree with Plan of Care  Patient       Patient will benefit from skilled therapeutic intervention in order to improve the following deficits and impairments:  Decreased balance, Difficulty walking, Dizziness  Visit Diagnosis: Unsteadiness on feet  Difficulty in walking, not elsewhere classified  Repeated falls  Dizziness and giddiness     Problem List Patient Active Problem List   Diagnosis Date Noted  . Palpitations 05/30/2018  . Irregular heartbeat 03/02/2018  . TIA (transient ischemic attack) 02/15/2018  . Urinary, incontinence, stress female 06/16/2016  . Right carotid bruit 12/23/2014  . Exertional dyspnea 12/24/2013  . Bilateral arm numbness and tingling while sleeping - and shortly after waking 12/24/2013  . Poor balance 12/24/2013  . Edema of left lower extremity 12/21/2012  . Essential hypertension; borderline   . H/O hypercholesterolemia   . Osteoporosis - of the spine 09/22/2011    Rico Junker, PT, DPT 09/07/18    3:59 PM    [facility] [address] [place], [place], [address] Phone: [phone]   Fax:  [phone]  Name: [name] MRN: [id number] Date of Birth: [birth year]

## 2018-09-12 ENCOUNTER — Ambulatory Visit: Payer: Medicare Other | Admitting: Physical Therapy

## 2018-09-12 ENCOUNTER — Other Ambulatory Visit: Payer: Self-pay

## 2018-09-12 ENCOUNTER — Encounter: Payer: Self-pay | Admitting: Physical Therapy

## 2018-09-12 DIAGNOSIS — R42 Dizziness and giddiness: Secondary | ICD-10-CM

## 2018-09-12 DIAGNOSIS — R2681 Unsteadiness on feet: Secondary | ICD-10-CM | POA: Diagnosis not present

## 2018-09-12 DIAGNOSIS — R296 Repeated falls: Secondary | ICD-10-CM

## 2018-09-12 DIAGNOSIS — R262 Difficulty in walking, not elsewhere classified: Secondary | ICD-10-CM

## 2018-09-12 NOTE — Therapy (Signed)
[facility] [address] [place], [place], [address] Phone: [phone]   Fax:  [phone]  Physical Therapy Treatment  Patient Details  Name: [name] MRN: [id number] Date of Birth: [birth year] Referring Provider (PT): Lavone Orn, MD   Encounter Date: 09/12/2018  PT End of Session - 09/12/18 1406    Visit Number  15    Number of Visits  17    Date for PT Re-Evaluation  09/14/18    Authorization Type  [employer] Medicare - $20 copay.  10th visit PN    PT Start Time  1320    PT Stop Time  1358    PT Time Calculation (min)  38 min    Activity Tolerance  Patient tolerated treatment well    Behavior During Therapy  WFL for tasks assessed/performed       Past Medical History:  Diagnosis Date  . Abnormal Pap smear 1975-76  . Anemia    history of  . Arthritis    spine  . Breast cyst, left 1980  . Cystocele 2012  . Diverticulosis    with h/o Diverticulitis  . GERD (gastroesophageal reflux disease)   . H/O hypercholesterolemia   . H/O osteopenia   . H/O varicella   . H/O varicose veins   . Hx of Mumps   . Macular degeneration   . Seasonal allergies   . Urge incontinence 2012  . Urinary frequency 2010    Past Surgical History:  Procedure Laterality Date  . ABDOMINAL HYSTERECTOMY    . BLADDER SUSPENSION N/A 06/16/2016   Procedure: TRANSVAGINAL TAPE (TVT) PROCEDURE;  Surgeon: Everett Graff, MD;  Location: [facility];  Service: Gynecology;  Laterality: N/A;  . BREAST CYST ASPIRATION  1964  . COLONOSCOPY     Dr. Earlean Shawl  . CYSTOCELE REPAIR N/A 06/16/2016   Procedure: ANTERIOR REPAIR (CYSTOCELE);  Surgeon: Everett Graff, MD;  Location: [facility];  Service: Gynecology;  Laterality: N/A;  . CYSTOSCOPY N/A 06/16/2016   Procedure: CYSTOSCOPY;  Surgeon: Everett Graff, MD;  Location: [facility];  Service: Gynecology;  Laterality: N/A;  see anterior repair  . Lower Extremity Venous Dopplers  01/09/2012   Right and left lower  steroids: No evidence of thrombus or, thrombophlebitis; right and left GSV and SSV: No venous insufficiency. Normal exam.  . NM MYOVIEW LTD  11/2016   6.6 METS.  Reached 106% of max.  Heart rate.  Walk for 4:40 min.  EF 72%.  Normal blood pressure response.  upsloping ST segment depression, nonspecific.  Otherwise normal study.  LOW RISK.  No evidence of ischemia or infarction.  . TRANSTHORACIC ECHOCARDIOGRAM  01/2018   EF > 65%. LV size small. Gr 1 DD (normal for age).   Normal atrial size. Moderate Aortic Sclerosis without Stenosis. Moderate Mitral Annular Calcification with mild MR & no MS    There were no vitals filed for this visit.  Subjective Assessment - 09/12/18 1327    Subjective  Pt still having some dizziness with bending down to the floor but keeps practicing it.  No falls.    Pertinent History  TIA, Stage 3 CKD, urinary frequency and incontinence, macular degeneration, osteopenia, hypercholesterolemia, GERD, diverticulosis, IBS, stable angina pectoris, cystocele with repair, OA, anemia    Patient Stated Goals  To help her get her balance back and be able to walk 2 miles on a trail each day    Currently in Pain?  No/denies  [facility] PT Assessment - 09/12/18 1345      Ambulation/Gait   Ambulation/Gait  Yes    Ambulation/Gait Assistance  5: Supervision    Ambulation/Gait Assistance Details  on uneven pavement outside making L and R turns and up/down inclines with supervision with pt turning head L and R intermittently without LOB    Ambulation Distance (Feet)  2000 Feet    Assistive device  None    Gait Pattern  Poor foot clearance - left;Poor foot clearance - right;Step-through pattern    Ambulation Surface  Unlevel;Outdoor;Paved    Curb  5: Supervision      Standardized Balance Assessment   Standardized Balance Assessment  10 meter walk test    10 Meter Walk  11 seconds or 2.8 ft/sec      Functional Gait  Assessment   Gait assessed   Yes    Gait Level Surface   Walks 20 ft in less than 7 sec but greater than 5.5 sec, uses assistive device, slower speed, mild gait deviations, or deviates 6-10 in outside of the 12 in walkway width.    Change in Gait Speed  Able to change speed, demonstrates mild gait deviations, deviates 6-10 in outside of the 12 in walkway width, or no gait deviations, unable to achieve a major change in velocity, or uses a change in velocity, or uses an assistive device.    Gait with Horizontal Head Turns  Performs head turns smoothly with no change in gait. Deviates no more than 6 in outside 12 in walkway width    Gait with Vertical Head Turns  Performs head turns with no change in gait. Deviates no more than 6 in outside 12 in walkway width.    Gait and Pivot Turn  Pivot turns safely in greater than 3 sec and stops with no loss of balance, or pivot turns safely within 3 sec and stops with mild imbalance, requires small steps to catch balance.    Step Over Obstacle  Is able to step over 2 stacked shoe boxes taped together (9 in total height) without changing gait speed. No evidence of imbalance.    Gait with Narrow Base of Support  Ambulates less than 4 steps heel to toe or cannot perform without assistance.    Gait with Eyes Closed  Walks 20 ft, slow speed, abnormal gait pattern, evidence for imbalance, deviates 10-15 in outside 12 in walkway width. Requires more than 9 sec to ambulate 20 ft.    Ambulating Backwards  Walks 20 ft, uses assistive device, slower speed, mild gait deviations, deviates 6-10 in outside 12 in walkway width.    Steps  Alternating feet, no rail.    Total Score  21    FGA comment:  21/30                           PT Education - 09/12/18 1405    Education Details  plan for final session, encouraged pt to walk a small portion of walking trail with husband providing supervision before D/C on Friday.    Person(s) Educated  Patient    Methods  Explanation    Comprehension  Verbalized understanding        PT Short Term Goals - 08/17/18 1438      PT SHORT TERM GOAL #1   Title  = LTG        PT Long Term Goals - 09/12/18 1354  PT LONG TERM GOAL #1   Title  Pt will demonstrate independence with final HEP and will report returning to 2 mile walk on trail  (LTG due by 09/14/18)    Baseline  pt not comfortable performing tandem or backwards walking HEP, has not attempted trail walk 08/03/18    Time  6    Period  Weeks    Status  Partially Met      PT LONG TERM GOAL #3   Title  Pt will improve FGA to >/= 22/30 to indicate decreased falls risk in community    Baseline  21/30    Time  6    Period  Weeks    Status  Partially Met      PT LONG TERM GOAL #4   Title  Pt gait velocity will increase to >/= 3.2 ft/sec    Baseline  3.03 ft/sec > 2.8 ft/sec    Time  6    Period  Weeks    Status  Not Met      PT LONG TERM GOAL #5   Title  Pt will ambulate x 3000' outside over uneven pavement MOD I with no LOB during quick turns to L and R    Baseline  2000' with supervision outside, no dizziness    Time  6    Period  Weeks    Status  Partially Met            Plan - 09/12/18 1828    Clinical Impression Statement  Treatment session focused on assessment of progress towards LTG with re-assessment of gait velocity, FGA and safety with gait outside over longer distances.  Pt's gait velocity remains stable and has experienced no change since evaluation indicating pt is likely at her baseline.  Pt's dynamic balance has significantly improved and demonstrates improved safety with gait outside over uneven paved surfaces.  Recommended pt begin walking on paved trail for a very short distance and gradually progress to longer distances with husband's supervision.  Will finalize HEP recommendations at next session and plan to D/C.    Personal Factors and Comorbidities  Age;Comorbidity 3+    Comorbidities  TIA, Stage 3 CKD, urinary frequency and incontinence, macular degeneration, osteopenia,  hypercholesterolemia, GERD, diverticulosis, IBS, stable angina pectoris, cystocele with repair, OA, anemia    Examination-Activity Limitations  Bend;Locomotion Level;Reach Overhead    Examination-Participation Restrictions  Community Activity    Stability/Clinical Decision Making  Stable/Uncomplicated    Rehab Potential  Good    PT Frequency  2x / week    PT Duration  4 weeks   requesting additional 2x/week for 4 weeks (over a 6 week period due to scheduling is limited)   PT Treatment/Interventions  ADLs/Self Care Home Management;Canalith Repostioning;Gait training;Functional mobility training;Therapeutic activities;Therapeutic exercise;Balance training;Neuromuscular re-education;Patient/family education;Vestibular    PT Next Visit Plan  Finalize HEP: add ankle gastroc stretch, squat, reaching to floor for items    Consulted and Agree with Plan of Care  Patient       Patient will benefit from skilled therapeutic intervention in order to improve the following deficits and impairments:  Decreased balance, Difficulty walking, Dizziness  Visit Diagnosis: Unsteadiness on feet  Difficulty in walking, not elsewhere classified  Repeated falls  Dizziness and giddiness     Problem List Patient Active Problem List   Diagnosis Date Noted  . Palpitations 05/30/2018  . Irregular heartbeat 03/02/2018  . TIA (transient ischemic attack) 02/15/2018  . Urinary, incontinence, stress female 06/16/2016  .  Right carotid bruit 12/23/2014  . Exertional dyspnea 12/24/2013  . Bilateral arm numbness and tingling while sleeping - and shortly after waking 12/24/2013  . Poor balance 12/24/2013  . Edema of left lower extremity 12/21/2012  . Essential hypertension; borderline   . H/O hypercholesterolemia   . Osteoporosis - of the spine 09/22/2011    Rico Junker, PT, DPT 09/12/18    6:32 PM    [facility] [address] [place],  [place], [address] Phone: [phone]   Fax:  [phone]  Name: JAKERIA CAISSIE MRN: [id number] Date of Birth: 1927/05/08

## 2018-09-14 ENCOUNTER — Encounter: Payer: Self-pay | Admitting: Physical Therapy

## 2018-09-14 ENCOUNTER — Other Ambulatory Visit: Payer: Self-pay

## 2018-09-14 ENCOUNTER — Ambulatory Visit: Payer: Medicare Other | Admitting: Physical Therapy

## 2018-09-14 DIAGNOSIS — R42 Dizziness and giddiness: Secondary | ICD-10-CM

## 2018-09-14 DIAGNOSIS — R262 Difficulty in walking, not elsewhere classified: Secondary | ICD-10-CM

## 2018-09-14 DIAGNOSIS — R2681 Unsteadiness on feet: Secondary | ICD-10-CM | POA: Diagnosis not present

## 2018-09-14 DIAGNOSIS — R296 Repeated falls: Secondary | ICD-10-CM

## 2018-09-14 NOTE — Therapy (Signed)
[facility] [address] [place], [place], [address] Phone: [phone]   Fax:  [phone]  Physical Therapy Treatment and D/C Summary  Patient Details  Name: [name] MRN: [id number] Date of Birth: May 03, 1927 Referring Provider (PT): Lavone Orn, MD   Encounter Date: 09/14/2018  PT End of Session - 09/14/18 1544    Visit Number  16    Number of Visits  17    Date for PT Re-Evaluation  09/14/18    Authorization Type  [employer] Medicare - $20 copay.  10th visit PN    PT Start Time  1320    PT Stop Time  1404    PT Time Calculation (min)  44 min    Activity Tolerance  Patient tolerated treatment well    Behavior During Therapy  WFL for tasks assessed/performed       Past Medical History:  Diagnosis Date  . Abnormal Pap smear 1975-76  . Anemia    history of  . Arthritis    spine  . Breast cyst, left 1980  . Cystocele 2012  . Diverticulosis    with h/o Diverticulitis  . GERD (gastroesophageal reflux disease)   . H/O hypercholesterolemia   . H/O osteopenia   . H/O varicella   . H/O varicose veins   . Hx of Mumps   . Macular degeneration   . Seasonal allergies   . Urge incontinence 2012  . Urinary frequency 2010    Past Surgical History:  Procedure Laterality Date  . ABDOMINAL HYSTERECTOMY    . BLADDER SUSPENSION N/A 06/16/2016   Procedure: TRANSVAGINAL TAPE (TVT) PROCEDURE;  Surgeon: Everett Graff, MD;  Location: [facility];  Service: Gynecology;  Laterality: N/A;  . BREAST CYST ASPIRATION  1964  . COLONOSCOPY     Dr. Earlean Shawl  . CYSTOCELE REPAIR N/A 06/16/2016   Procedure: ANTERIOR REPAIR (CYSTOCELE);  Surgeon: Everett Graff, MD;  Location: [facility];  Service: Gynecology;  Laterality: N/A;  . CYSTOSCOPY N/A 06/16/2016   Procedure: CYSTOSCOPY;  Surgeon: Everett Graff, MD;  Location: [facility];  Service: Gynecology;  Laterality: N/A;  see anterior repair  . Lower Extremity Venous Dopplers  01/09/2012   Right and left lower steroids: No evidence of thrombus or, thrombophlebitis; right and left GSV and SSV: No venous insufficiency. Normal exam.  . NM MYOVIEW LTD  11/2016   6.6 METS.  Reached 106% of max.  Heart rate.  Walk for 4:40 min.  EF 72%.  Normal blood pressure response.  upsloping ST segment depression, nonspecific.  Otherwise normal study.  LOW RISK.  No evidence of ischemia or infarction.  . TRANSTHORACIC ECHOCARDIOGRAM  01/2018   EF > 65%. LV size small. Gr 1 DD (normal for age).   Normal atrial size. Moderate Aortic Sclerosis without Stenosis. Moderate Mitral Annular Calcification with mild MR & no MS    There were no vitals filed for this visit.  Subjective Assessment - 09/14/18 1324    Subjective  Still trying to work on standing on one leg to put pants on without propping.  Didn't walk on the trail yesterday but did walk a few laps around her neighborhood; no dizziness but did get out of breath.    Pertinent History  TIA, Stage 3 CKD, urinary frequency and incontinence, macular degeneration, osteopenia, hypercholesterolemia, GERD, diverticulosis, IBS, stable angina pectoris, cystocele with repair, OA, anemia    Patient Stated Goals  To help her get her balance back and be able to  walk 2 miles on a trail each day    Currently in Pain?  No/denies                       [facility] Adult PT Treatment/Exercise - 09/14/18 0001      Shoulder Exercises: Stretch   Wall Stretch - ABduction  2 reps;30 seconds    Wall Stretch - ABduction Limitations  horizontal ABD with rotation away from arm and neck rotation away from arm      Ankle Exercises: Stretches   Gastroc Stretch  2 reps;30 seconds    Gastroc Stretch Limitations  off edge of step      Vestibular Treatment/Exercise - 09/14/18 1335      Vestibular Treatment/Exercise   Vestibular Treatment Provided  Gaze    Gaze Exercises  X1 Viewing Horizontal;X1 Viewing Vertical      X1 Viewing Horizontal   Foot Position   standing with feet apart and then feet together; no UE support    Reps  2    Comments  60 seconds      X1 Viewing Vertical   Foot Position  standing with feet apart and then feet together; no UE support    Reps  2    Comments  60 seconds         Balance Exercises - 09/14/18 1339      Balance Exercises: Standing   Standing Eyes Opened  Narrow base of support (BOS);Wide (BOA);Head turns;Foam/compliant surface;Other reps (comment)   10 reps head turns/nods   Standing Eyes Closed  Wide (BOA);Foam/compliant surface;10 secs    Tandem Gait  Forward;Intermittent upper extremity support;4 reps   looking upright      Access Code: [email or web address]  URL: [email or web address]  Date: 09/14/2018  Prepared by: Misty Stanley   Exercises Standing Shoulder External Rotation Stretch in Doorway - 2 sets - 30 second hold - 1x daily - 7x weekly Standing Gaze Stabilization with Head Rotation - 2 sets - 60 second hold - 2x daily - 7x weekly Standing Gaze Stabilization with Head Nod - 2 sets - 60 second hold - 1x daily - 7x weekly Walking Tandem Stance - 10 reps - 4 sets - 1x daily - 7x weekly Wide Tandem Stance on Foam Pad with Eyes Open - 10 reps - 4 sets - 1x daily - 7x weekly Wide Tandem Stance on Foam Pad with Eyes Closed - 2 sets - 10 second hold - 1x daily - 7x weekly Gastroc Stretch on Step - 2 sets - 30 second hold - 1x daily - 7x weekly        PT Education - 09/14/18 1544    Education Details  final HEP for D/C    Person(s) Educated  Patient    Methods  Explanation;Demonstration;Handout    Comprehension  Verbalized understanding;Returned demonstration       PT Short Term Goals - 08/17/18 1438      PT SHORT TERM GOAL #1   Title  = LTG        PT Long Term Goals - 09/14/18 1544      PT LONG TERM GOAL #1   Title  Pt will demonstrate independence with final HEP and will report returning to 2 mile walk on trail  (LTG due by 09/14/18)    Baseline  independent with HEP;  has begun walking around neighborhood but not trail    Time  6    Period  Weeks    Status  Partially Met      PT LONG TERM GOAL #3   Title  Pt will improve FGA to >/= 22/30 to indicate decreased falls risk in community    Baseline  21/30    Time  6    Period  Weeks    Status  Partially Met      PT LONG TERM GOAL #4   Title  Pt gait velocity will increase to >/= 3.2 ft/sec    Baseline  3.03 ft/sec > 2.8 ft/sec    Time  6    Period  Weeks    Status  Not Met      PT LONG TERM GOAL #5   Title  Pt will ambulate x 3000' outside over uneven pavement MOD I with no LOB during quick turns to L and R    Baseline  2000' with supervision outside, no dizziness    Time  6    Period  Weeks    Status  Partially Met            Plan - 09/14/18 1545    Clinical Impression Statement  Completed final goal with review and revision of final HEP to continued to address LE ROM, balance and vestibular system.  Pt tolerated all exercises well and demonstrates progress with each area.  Pt has returned to walking around neighborhood but has not returned to ambulating at trail due to lack of confidence.  Pt is ready for D/C; pt is hesitant due to feeling more confident when with therapist.  Encouraged pt about the progress she has made and to continue with exercise and walking to continue to improve her confidence.    Personal Factors and Comorbidities  Age;Comorbidity 3+    Comorbidities  TIA, Stage 3 CKD, urinary frequency and incontinence, macular degeneration, osteopenia, hypercholesterolemia, GERD, diverticulosis, IBS, stable angina pectoris, cystocele with repair, OA, anemia    Examination-Activity Limitations  Bend;Locomotion Level;Reach Overhead    Examination-Participation Restrictions  Community Activity    Stability/Clinical Decision Making  Stable/Uncomplicated    Rehab Potential  Good    PT Frequency  2x / week    PT Duration  4 weeks   requesting additional 2x/week for 4 weeks (over a 6  week period due to scheduling is limited)   PT Treatment/Interventions  ADLs/Self Care Home Management;Canalith Repostioning;Gait training;Functional mobility training;Therapeutic activities;Therapeutic exercise;Balance training;Neuromuscular re-education;Patient/family education;Vestibular    PT Next Visit Plan  D/C    Consulted and Agree with Plan of Care  Patient       Patient will benefit from skilled therapeutic intervention in order to improve the following deficits and impairments:  Decreased balance, Difficulty walking, Dizziness  Visit Diagnosis: Unsteadiness on feet  Difficulty in walking, not elsewhere classified  Repeated falls  Dizziness and giddiness     Problem List Patient Active Problem List   Diagnosis Date Noted  . Palpitations 05/30/2018  . Irregular heartbeat 03/02/2018  . TIA (transient ischemic attack) 02/15/2018  . Urinary, incontinence, stress female 06/16/2016  . Right carotid bruit 12/23/2014  . Exertional dyspnea 12/24/2013  . Bilateral arm numbness and tingling while sleeping - and shortly after waking 12/24/2013  . Poor balance 12/24/2013  . Edema of left lower extremity 12/21/2012  . Essential hypertension; borderline   . H/O hypercholesterolemia   . Osteoporosis - of the spine 09/22/2011    PHYSICAL THERAPY DISCHARGE SUMMARY  Visits from Start of Care: 16  Current functional level related to goals / functional outcomes: See  LTG achievement and impression statement above   Remaining deficits: Mild dizziness, impaired balance   Education / Equipment: HEP  Plan: Patient agrees to discharge.  Patient goals were met. Patient is being discharged due to meeting the stated rehab goals.  ?????     Rico Junker, PT, DPT 09/14/18    3:55 PM    [facility] [address] [place], [place], [address] Phone: [phone]   Fax:  [phone]  Name: [name] MRN:  [id number] Date of Birth: 01-Nov-1927

## 2018-09-14 NOTE — Patient Instructions (Addendum)
Access Code: [email or web address]  URL: [email or web address]  Date: 09/14/2018  Prepared by: Misty Stanley   Exercises Standing Shoulder External Rotation Stretch in Doorway - 2 sets - 30 second hold - 1x daily - 7x weekly Standing Gaze Stabilization with Head Rotation - 2 sets - 60 second hold - 2x daily - 7x weekly Standing Gaze Stabilization with Head Nod - 2 sets - 60 second hold - 1x daily - 7x weekly Walking Tandem Stance - 10 reps - 4 sets - 1x daily - 7x weekly Wide Tandem Stance on Foam Pad with Eyes Open - 10 reps - 4 sets - 1x daily - 7x weekly Wide Tandem Stance on Foam Pad with Eyes Closed - 2 sets - 10 second hold - 1x daily - 7x weekly Gastroc Stretch on Step - 2 sets - 30 second hold - 1x daily - 7x weekly

## 2018-10-14 ENCOUNTER — Emergency Department (HOSPITAL_COMMUNITY): Payer: Medicare Other

## 2018-10-14 ENCOUNTER — Emergency Department (HOSPITAL_COMMUNITY)
Admission: EM | Admit: 2018-10-14 | Discharge: 2018-10-14 | Disposition: A | Discharge status: Home / Self Care | Payer: Medicare Other | Attending: Emergency Medicine | Admitting: Emergency Medicine

## 2018-10-14 ENCOUNTER — Other Ambulatory Visit: Payer: Self-pay

## 2018-10-14 DIAGNOSIS — M25511 Pain in right shoulder: Secondary | ICD-10-CM | POA: Diagnosis not present

## 2018-10-14 DIAGNOSIS — M5489 Other dorsalgia: Secondary | ICD-10-CM | POA: Diagnosis not present

## 2018-10-14 DIAGNOSIS — I10 Essential (primary) hypertension: Secondary | ICD-10-CM | POA: Diagnosis not present

## 2018-10-14 DIAGNOSIS — Z8673 Personal history of transient ischemic attack (TIA), and cerebral infarction without residual deficits: Secondary | ICD-10-CM | POA: Insufficient documentation

## 2018-10-14 DIAGNOSIS — Z7982 Long term (current) use of aspirin: Secondary | ICD-10-CM | POA: Diagnosis not present

## 2018-10-14 DIAGNOSIS — Z79899 Other long term (current) drug therapy: Secondary | ICD-10-CM | POA: Diagnosis not present

## 2018-10-14 DIAGNOSIS — M549 Dorsalgia, unspecified: Secondary | ICD-10-CM

## 2018-10-14 LAB — CBC
HCT: 32.5 % — ABNORMAL LOW (ref 36.0–46.0)
Hemoglobin: 11.5 g/dL — ABNORMAL LOW (ref 12.0–15.0)
MCH: 31.2 pg (ref 26.0–34.0)
MCHC: 35.4 g/dL (ref 30.0–36.0)
MCV: 88.1 fL (ref 80.0–100.0)
Platelets: 190 10*3/uL (ref 150–400)
RBC: 3.69 MIL/uL — ABNORMAL LOW (ref 3.87–5.11)
RDW: 13.2 % (ref 11.5–15.5)
WBC: 8 10*3/uL (ref 4.0–10.5)
nRBC: 0 % (ref 0.0–0.2)

## 2018-10-14 LAB — D-DIMER, QUANTITATIVE: D-Dimer, Quant: 0.38 ug/mL-FEU (ref 0.00–0.50)

## 2018-10-14 LAB — BASIC METABOLIC PANEL
Anion gap: 10 (ref 5–15)
BUN: 18 mg/dL (ref 8–23)
CO2: 23 mmol/L (ref 22–32)
Calcium: 9.3 mg/dL (ref 8.9–10.3)
Chloride: 107 mmol/L (ref 98–111)
Creatinine, Ser: 1 mg/dL (ref 0.44–1.00)
GFR calc Af Amer: 58 mL/min — ABNORMAL LOW (ref >60)
GFR calc non Af Amer: 50 mL/min — ABNORMAL LOW (ref >60)
Glucose, Bld: 111 mg/dL — ABNORMAL HIGH (ref 70–99)
Potassium: 4.2 mmol/L (ref 3.5–5.1)
Sodium: 140 mmol/L (ref 135–145)

## 2018-10-14 LAB — TROPONIN I (HIGH SENSITIVITY)
Troponin I (High Sensitivity): 13 ng/L (ref <18)
Troponin I (High Sensitivity): 11 ng/L (ref <18)

## 2018-10-14 MED ORDER — KETOROLAC TROMETHAMINE 15 MG/ML IJ SOLN
7.5000 mg | Freq: Once | INTRAMUSCULAR | Status: AC
  Administered 2018-10-14: 7.5 mg via INTRAVENOUS
  Filled 2018-10-14: qty 1

## 2018-10-14 MED ORDER — SODIUM CHLORIDE 0.9 % IV BOLUS
500.0000 mL | Freq: Once | INTRAVENOUS | Status: AC
  Administered 2018-10-14: 500 mL via INTRAVENOUS

## 2018-10-14 NOTE — Discharge Instructions (Signed)
You may use over-the-counter Motrin (Ibuprofen), Acetaminophen (Tylenol), topical muscle creams such as SalonPas, Icy Hot, Bengay, etc. Please stretch, apply heat, and have massage therapy for additional assistance. ° °

## 2018-10-14 NOTE — ED Triage Notes (Addendum)
C/o right, posterior shoulder pain that radiates to right chest. Pain with inspiration earlier, but has resolved. Pt has had 3 SL nitros. Pt also had c/o nausea and was given 4mg  Zofran by EMS.

## 2018-10-14 NOTE — ED Provider Notes (Signed)
[facility] EMERGENCY DEPARTMENT Provider Note  CSN: [id number] Arrival date & time: 10/14/18 0447  Chief Complaint(s) Shoulder Pain (right )  HPI [name] is a 85 y.o. female   The history is provided by the patient.  Shoulder Pain Location:  Shoulder Shoulder location:  R shoulder Injury: no   Pain details:    Quality:  Aching   Radiates to:  Chest   Severity:  Severe   Onset quality:  Sudden   Duration:  2 hours   Timing:  Constant   Progression:  Waxing and waning Dislocation: no   Relieved by:  Immobilization (and NTG) Worsened by:  Movement   Past Medical History Past Medical History:  Diagnosis Date  . Abnormal Pap smear 1975-76  . Anemia    history of  . Arthritis    spine  . Breast cyst, left 1980  . Cystocele 2012  . Diverticulosis    with h/o Diverticulitis  . GERD (gastroesophageal reflux disease)   . H/O hypercholesterolemia   . H/O osteopenia   . H/O varicella   . H/O varicose veins   . Hx of Mumps   . Macular degeneration   . Seasonal allergies   . Urge incontinence 2012  . Urinary frequency 2010   Patient Active Problem List   Diagnosis Date Noted  . Palpitations 05/30/2018  . Irregular heartbeat 03/02/2018  . TIA (transient ischemic attack) 02/15/2018  . Urinary, incontinence, stress female 06/16/2016  . Right carotid bruit 12/23/2014  . Exertional dyspnea 12/24/2013  . Bilateral arm numbness and tingling while sleeping - and shortly after waking 12/24/2013  . Poor balance 12/24/2013  . Edema of left lower extremity 12/21/2012  . Essential hypertension; borderline   . H/O hypercholesterolemia   . Osteoporosis - of the spine 09/22/2011   Home Medication(s) Prior to Admission medications   Medication Sig Start Date End Date Taking? Authorizing Provider  acetaminophen (TYLENOL) 500 MG tablet Take 1,000 mg by mouth every 8 (eight) hours as needed for mild pain.    [provider]  aspirin EC 81 MG  tablet Take 1 tablet (81 mg total) by mouth daily. 02/16/18 02/16/19  Danford, Suann Larry, MD  carboxymethylcellulose (REFRESH PLUS) 0.5 % SOLN Place 2 drops into both eyes 3 (three) times daily as needed (for dry eyes).    [provider]  cholecalciferol (VITAMIN D) 1000 units tablet Take 1,000 Units by mouth daily.    [provider]  Cyanocobalamin (VITAMIN B-12) 3000 MCG SUBL Place 3,000 mcg under the tongue 3 (three) times a week.    [provider]  Lactobacillus Rhamnosus, GG, (CULTURELLE) CAPS Take 1 capsule by mouth every morning.     [provider]  Magnesium 250 MG TABS Take 250 mg by mouth daily as needed (leg cramps).    [provider]  meclizine (ANTIVERT) 25 MG tablet Take 1 tablet (25 mg total) by mouth 2 (two) times daily as needed for dizziness. 05/31/18   Julianne Rice, MD  Multiple Vitamin (MULTIVITAMIN WITH MINERALS) TABS Take 1 tablet by mouth daily.    [provider]  Multiple Vitamins-Minerals (HAIR VITAMINS) TABS Take 1 tablet by mouth 3 (three) times a week. Hair supplement called Hair Volume    [provider]  pantoprazole (PROTONIX) 40 MG tablet Take 40 mg by mouth daily. 05/30/18   [provider]  Past Surgical History Past Surgical History:  Procedure Laterality Date  . ABDOMINAL HYSTERECTOMY    . BLADDER SUSPENSION N/A 06/16/2016   Procedure: TRANSVAGINAL TAPE (TVT) PROCEDURE;  Surgeon: Everett Graff, MD;  Location: [facility];  Service: Gynecology;  Laterality: N/A;  . BREAST CYST ASPIRATION  1964  . COLONOSCOPY     Dr. Earlean Shawl  . CYSTOCELE REPAIR N/A 06/16/2016   Procedure: ANTERIOR REPAIR (CYSTOCELE);  Surgeon: Everett Graff, MD;  Location: [facility];  Service: Gynecology;  Laterality: N/A;  . CYSTOSCOPY N/A 06/16/2016   Procedure: CYSTOSCOPY;  Surgeon:  Everett Graff, MD;  Location: [facility];  Service: Gynecology;  Laterality: N/A;  see anterior repair  . Lower Extremity Venous Dopplers  01/09/2012   Right and left lower steroids: No evidence of thrombus or, thrombophlebitis; right and left GSV and SSV: No venous insufficiency. Normal exam.  . NM MYOVIEW LTD  11/2016   6.6 METS.  Reached 106% of max.  Heart rate.  Walk for 4:40 min.  EF 72%.  Normal blood pressure response.  upsloping ST segment depression, nonspecific.  Otherwise normal study.  LOW RISK.  No evidence of ischemia or infarction.  . TRANSTHORACIC ECHOCARDIOGRAM  01/2018   EF > 65%. LV size small. Gr 1 DD (normal for age).   Normal atrial size. Moderate Aortic Sclerosis without Stenosis. Moderate Mitral Annular Calcification with mild MR & no MS   Family History Family History  Problem Relation Age of Onset  . Uterine cancer Mother 43  . Heart attack Brother 76    Social History Social History   Tobacco Use  . Smoking status: Never Smoker  . Smokeless tobacco: Never Used  Substance Use Topics  . Alcohol use: No  . Drug use: No   Allergies Ivp dye [iodinated diagnostic agents] and Myrbetriq [mirabegron]  Review of Systems Review of Systems All other systems are reviewed and are negative for acute change except as noted in the HPI  Physical Exam Vital Signs  I have reviewed the triage vital signs BP (!) 147/68   Pulse 68   Temp 98 F (36.7 C)   Resp 17   Ht 5\' 5"  (1.651 m)   Wt 59.4 kg   SpO2 93%   BMI 21.80 kg/m   Physical Exam Vitals signs reviewed.  Constitutional:      General: She is not in acute distress.    Appearance: She is well-developed. She is not diaphoretic.  HENT:     Head: Normocephalic and atraumatic.     Nose: Nose normal.  Eyes:     General: No scleral icterus.       Right eye: No discharge.        Left eye: No discharge.     Conjunctiva/sclera: Conjunctivae normal.     Pupils: Pupils are equal, round, and reactive to light.   Neck:     Musculoskeletal: Normal range of motion and neck supple.  Cardiovascular:     Rate and Rhythm: Normal rate and regular rhythm.     Heart sounds: No murmur. No friction rub. No gallop.   Pulmonary:     Effort: Pulmonary effort is normal. No respiratory distress.     Breath sounds: Normal breath sounds. No stridor. No rales.  Chest:     Chest wall: Tenderness present.    Abdominal:     General: There is no distension.     Palpations: Abdomen is soft.     Tenderness: There is no abdominal tenderness.  Musculoskeletal:     Cervical back: She exhibits tenderness and spasm. She exhibits no bony tenderness.       Back:  Skin:    General: Skin is warm and dry.     Findings: No erythema or rash.  Neurological:     Mental Status: She is alert and oriented to person, place, and time.     ED Results and Treatments Labs (all labs ordered are listed, but only abnormal results are displayed) Labs Reviewed  BASIC METABOLIC PANEL - Abnormal; Notable for the following components:      Result Value   Glucose, Bld 111 (*)    GFR calc non Af Amer 50 (*)    GFR calc Af Amer 58 (*)    All other components within normal limits  CBC - Abnormal; Notable for the following components:   RBC 3.69 (*)    Hemoglobin 11.5 (*)    HCT 32.5 (*)    All other components within normal limits  D-DIMER, QUANTITATIVE (NOT AT [facility])  TROPONIN I (HIGH SENSITIVITY)  TROPONIN I (HIGH SENSITIVITY)                                                                                                                         EKG  EKG Interpretation  Date/Time:  Sunday October 14 2018 04:51:37 EDT Ventricular Rate:  68 PR Interval:    QRS Duration: 119 QT Interval:  420 QTC Calculation: 447 R Axis:   179 Text Interpretation:  Sinus rhythm Nonspecific intraventricular conduction delay Abnormal T, consider ischemia, lateral leads, new since May 2020 NO STEMI. Confirmed by Addison Lank [phone]) on  10/14/2018 4:55:41 AM      Radiology Dg Chest 2 View  Result Date: 10/14/2018 CLINICAL DATA:  Right posterior shoulder pain radiates to right chest. EXAM: CHEST - 2 VIEW COMPARISON:  06/16/2017 FINDINGS: The lungs are clear without focal pneumonia, edema, pneumothorax or pleural effusion. The cardiopericardial silhouette is within normal limits for size. The visualized bony structures of the thorax are intact. Telemetry leads overlie the chest. IMPRESSION: No active cardiopulmonary disease. Electronically Signed   By: Misty Stanley M.D.   On: 10/14/2018 06:42    Pertinent labs & imaging results that were available during my care of the patient were reviewed by me and considered in my medical decision making (see chart for details).  Medications Ordered in ED Medications  ketorolac (TORADOL) 15 MG/ML injection 7.5 mg (7.5 mg Intravenous Given 10/14/18 0555)  sodium chloride 0.9 % bolus 500 mL (0 mLs Intravenous Stopped 10/14/18 0733)  Procedures Procedures  (including critical care time)  Medical Decision Making / ED Course I have reviewed the nursing notes for this encounter and the patient's prior records (if available in EHR or on provided paperwork).   [name] was evaluated in Emergency Department on 10/14/2018 for the symptoms described in the history of present illness. She was evaluated in the context of the global COVID-19 pandemic, which necessitated consideration that the patient might be at risk for infection with the SARS-CoV-2 virus that causes COVID-19. Institutional protocols and algorithms that pertain to the evaluation of patients at risk for COVID-19 are in a state of rapid change based on information released by regulatory bodies including the CDC and federal and state organizations. These policies and algorithms were followed during the  patient's care in the ED.  Patient presents with right upper back pain radiating to her chest.  Atypical for ACS.  EKG did reveal new T wave changes when compared to EKG tracings prior to March.  Initial troponin is negative.  Feel her pain is muscular in nature however given the new changes, will obtain a delta troponin.  Dimer negative.  Low suspicion for pulmonary embolism.  Presentation not classic for aortic dissection or esophageal perforation.  Chest x-ray without evidence suggestive of pneumonia, pneumothorax, pneumomediastinum.  No abnormal contour of the mediastinum to suggest dissection. No evidence of acute injuries.  Patient provided with IV Toradol and heat pack which provided significant relief.    Patient care turned over to Dr Wilson Singer pending delta trop. Patient case and results discussed in detail; please see their note for further ED managment.     Final Clinical Impression(s) / ED Diagnoses Final diagnoses:  Upper back pain on right side      This chart was dictated using voice recognition software.  Despite best efforts to proofread,  errors can occur which can change the documentation meaning.   Fatima Blank, MD 10/14/18 [phone]

## 2018-11-29 ENCOUNTER — Other Ambulatory Visit: Payer: Self-pay

## 2018-11-29 ENCOUNTER — Encounter: Payer: Self-pay | Admitting: Cardiology

## 2018-11-29 ENCOUNTER — Ambulatory Visit: Payer: Medicare Other | Admitting: Cardiology

## 2018-11-29 VITALS — BP 120/68 | HR 97 | Ht 65.0 in | Wt 134.6 lb

## 2018-11-29 DIAGNOSIS — I10 Essential (primary) hypertension: Secondary | ICD-10-CM | POA: Diagnosis not present

## 2018-11-29 DIAGNOSIS — Z8639 Personal history of other endocrine, nutritional and metabolic disease: Secondary | ICD-10-CM | POA: Diagnosis not present

## 2018-11-29 DIAGNOSIS — R002 Palpitations: Secondary | ICD-10-CM

## 2018-11-29 DIAGNOSIS — R600 Localized edema: Secondary | ICD-10-CM

## 2018-11-29 DIAGNOSIS — G459 Transient cerebral ischemic attack, unspecified: Secondary | ICD-10-CM

## 2018-11-29 MED ORDER — FUROSEMIDE 20 MG PO TABS: ORAL_TABLET | ORAL | 3 refills | Status: DC

## 2018-11-29 NOTE — Patient Instructions (Signed)
Medication Instructions:  START FUROSEMIDE 20 MG ONCE DAILY AS NEEDED FOR SWELLING  *If you need a refill on your cardiac medications before your next appointment, please call your pharmacy*  Lab Work: If you have labs (blood work) drawn today and your tests are completely normal, you will receive your results only by: Marland Kitchen MyChart Message (if you have MyChart) OR . A paper copy in the mail If you have any lab test that is abnormal or we need to change your treatment, we will call you to review the results.  Testing/Procedures: Your physician has requested that you have a lower extremity venous duplex. This test is an ultrasound of the veins in the legs or arms. It looks at venous blood flow that carries blood from the heart to the legs or arms. Allow one hour for a Lower Venous exam. Allow thirty minutes for an Upper Venous exam. There are no restrictions or special instructions.  Follow-Up: At [facility], you and your health needs are our priority.  As part of our continuing mission to provide you with exceptional heart care, we have created designated Provider Care Teams.  These Care Teams include your primary Cardiologist (physician) and Advanced Practice Providers (APPs -  Physician Assistants and Nurse Practitioners) who all work together to provide you with the care you need, when you need it.  Your next appointment:   12 months  The format for your next appointment:   In Person  Provider:   Glenetta Hew, MD

## 2018-11-29 NOTE — Progress Notes (Signed)
Primary Care Provider: Lavone Orn, MD Cardiologist: Glenetta Hew, MD Electrophysiologist:   Clinic Note: Chief Complaint  Patient presents with   Follow-up    19-month; no complaints    HPI:    [name] is a 85 y.o. female with a PMH below who presents today for 88-month follow-up with history of hypertension hyperlipidemia and TIA earlier this year.  [name] was last seen on in May via Telemedicine as a second follow-up after her TIA to discuss results of 14-day monitor.  She noted she was drinking plenty and eating well.  Did have some nausea.  Also occasional fluttering but nothing prolonged.  No further syncope/near syncope or TIA symptoms.  Her echo and carotid Dopplers were normal. --> We are allowing for little bit of permissive hypertension. --> Continue rehab for balance issues.  Recent Hospitalizations: She went to the ER for back pain in September  Reviewed  CV studies:    The following studies were reviewed today: (if available, images/films reviewed: From Epic Chart or Care Everywhere)  None   Interval History:   [name] returns here today for in person evaluation 6 months after her telemedicine visit.  She states that she is doing much better.  Her balance is still off but she is walking more and doing more.  She is only walking 1 mile now as opposed to 2, but more because she is concerned about balance and it takes her longer to do it.  No dyspnea associated with it.  She is been doing balance exercises which have not helped notably.  Has not noticed any significant palpitations.  Overall feels better than my last saw her.  Most notable symptom is swelling in the left greater than right foot.  It is also somewhat tender and aches a little bit.  No discoloration.  She does not recall any injury.  Remainder of CV Review of Symptoms (Summary)  no chest pain or dyspnea on exertion positive for - Left leg swelling, unsteady gait with decreased  exercise tolerance negative for - irregular heartbeat, orthopnea, palpitations, paroxysmal nocturnal dyspnea, rapid heart rate, shortness of breath or Syncope/near syncope, TIA/amaurosis fugax; claudication  The patient does not have symptoms concerning for COVID-19 infection (fever, chills, cough, or new shortness of breath).  The patient is practicing social distancing. ++ Masking.  Minimal excursions for groceries/shopping -> she has family and friends to take care of her.   REVIEWED OF SYSTEMS   A comprehensive ROS was performed. Review of Systems  Constitutional: Negative for malaise/fatigue and weight loss.  HENT: Negative for congestion and nosebleeds.   Respiratory: Negative for cough, shortness of breath and wheezing.   Gastrointestinal: Negative for blood in stool, heartburn, melena, nausea and vomiting.  Genitourinary: Negative for hematuria.  Musculoskeletal: Negative for falls and joint pain.  Neurological: Positive for dizziness (Poor balance.  Also dizziness with bending over). Negative for sensory change, speech change, focal weakness and weakness.  Psychiatric/Behavioral: Negative for depression and memory loss. The patient is not nervous/anxious and does not have insomnia.   All other systems reviewed and are negative.  I have reviewed and (if needed) personally updated the patient's problem list, medications, allergies, past medical and surgical history, social and family history.   PAST MEDICAL HISTORY   Past Medical History:  Diagnosis Date   Abnormal Pap smear 1975-76   Anemia    history of   Arthritis    spine   Breast cyst, left  1980   Cystocele 2012   Diverticulosis    with h/o Diverticulitis   GERD (gastroesophageal reflux disease)    H/O hypercholesterolemia    H/O osteopenia    H/O varicella    H/O varicose veins    Hx of Mumps    Macular degeneration    Seasonal allergies    Urge incontinence 2012   Urinary frequency 2010      PAST SURGICAL HISTORY   Past Surgical History:  Procedure Laterality Date   ABDOMINAL HYSTERECTOMY     BLADDER SUSPENSION N/A 06/16/2016   Procedure: TRANSVAGINAL TAPE (TVT) PROCEDURE;  Surgeon: Everett Graff, MD;  Location: [facility];  Service: Gynecology;  Laterality: N/A;   BREAST CYST ASPIRATION  1964   COLONOSCOPY     Dr. Jodene Nam REPAIR N/A 06/16/2016   Procedure: ANTERIOR REPAIR (CYSTOCELE);  Surgeon: Everett Graff, MD;  Location: [facility];  Service: Gynecology;  Laterality: N/A;   CYSTOSCOPY N/A 06/16/2016   Procedure: CYSTOSCOPY;  Surgeon: Everett Graff, MD;  Location: [facility];  Service: Gynecology;  Laterality: N/A;  see anterior repair   Lower Extremity Venous Dopplers  01/09/2012   Right and left lower steroids: No evidence of thrombus or, thrombophlebitis; right and left GSV and SSV: No venous insufficiency. Normal exam.   NM MYOVIEW LTD  11/2016   6.6 METS.  Reached 106% of max.  Heart rate.  Walk for 4:40 min.  EF 72%.  Normal blood pressure response.  upsloping ST segment depression, nonspecific.  Otherwise normal study.  LOW RISK.  No evidence of ischemia or infarction.   TRANSTHORACIC ECHOCARDIOGRAM  01/2018   EF > 65%. LV size small. Gr 1 DD (normal for age).   Normal atrial size. Moderate Aortic Sclerosis without Stenosis. Moderate Mitral Annular Calcification with mild MR & no MS    14-day ZIO patch monitor:   Predominant underlying rhythm sinus rhythm. Minimum heart rate 51 bpm. Average heart rate 88 bpm.. Max sinus tachycardia 151 Bpm.  1 4-5 beat run of NSVT with max rate 184 bpm  21 episodes of SVT --> longest lasted 15 beats average rate 153 Bpm  Max recorded heart rate -4 beat run of PAT/SVT (203 bpm)  Rare PACs and PVCs. - consider ILR  MEDICATIONS/ALLERGIES   Current Meds  Medication Sig   acetaminophen (TYLENOL) 500 MG tablet Take 1,000 mg by mouth every 8 (eight) hours as needed for mild pain.   aspirin EC 81 MG tablet  Take 1 tablet (81 mg total) by mouth daily.   carboxymethylcellulose (REFRESH PLUS) 0.5 % SOLN Place 2 drops into both eyes 3 (three) times daily as needed (for dry eyes).   cholecalciferol (VITAMIN D) 1000 units tablet Take 1,000 Units by mouth daily.   Cyanocobalamin (VITAMIN B-12) 3000 MCG SUBL Place 3,000 mcg under the tongue 3 (three) times a week.   Lactobacillus Rhamnosus, GG, (CULTURELLE) CAPS Take 1 capsule by mouth every morning.    Magnesium 250 MG TABS Take 250 mg by mouth daily as needed (leg cramps).   meclizine (ANTIVERT) 25 MG tablet Take 1 tablet (25 mg total) by mouth 2 (two) times daily as needed for dizziness.   Multiple Vitamin (MULTIVITAMIN WITH MINERALS) TABS Take 1 tablet by mouth daily.   Multiple Vitamins-Minerals (HAIR VITAMINS) TABS Take 1 tablet by mouth 3 (three) times a week. Hair supplement called Hair Volume   pantoprazole (PROTONIX) 40 MG tablet Take 40 mg by mouth daily.   pravastatin (PRAVACHOL) 20  MG tablet pravastatin sodium  20 mg tabs   Trospium Chloride 60 MG CP24 Take 1 capsule by mouth daily.    Allergies  Allergen Reactions   Ivp Dye [Iodinated Diagnostic Agents] Hives   Myrbetriq [Mirabegron] Other (See Comments)    "Makes me urinate more frequently"     SOCIAL HISTORY/FAMILY HISTORY   Social History   Tobacco Use   Smoking status: Never Smoker   Smokeless tobacco: Never Used  Substance Use Topics   Alcohol use: No   Drug use: No   Social History   Social History Narrative   She is a married mother of 78, grandmother of 63, great grandmother of 68. She walks   daily about 2 miles a day, does not smoke, does not drink.     Family History family history includes Heart attack (age of onset: 42) in her brother; Uterine cancer (age of onset: 50) in her mother.   OBJCTIVE -PE, EKG, labs   Wt Readings from Last 3 Encounters:  11/29/18 134 lb 9.6 oz (61.1 kg)  10/14/18 131 lb (59.4 kg)  05/31/18 135 lb (61.2 kg)     Physical Exam: BP 120/68    Pulse 97    Ht 5\' 5"  (1.651 m)    Wt 134 lb 9.6 oz (61.1 kg)    SpO2 95%    BMI 22.40 kg/m  Physical Exam  Constitutional: She is oriented to person, place, and time. She appears well-developed and well-nourished. No distress.  Healthy-appearing elderly woman.  Appears younger than stated age.  Well-groomed.  HENT:  Head: Normocephalic and atraumatic.  Neck: Normal range of motion. Neck supple. Normal carotid pulses, no hepatojugular reflux and no JVD present. Carotid bruit is present (Soft right bruit.  (Possibly radiated aortic murmur)).  Cardiovascular: Normal rate, regular rhythm and intact distal pulses.  Occasional extrasystoles are present. PMI is not displaced. Exam reveals no gallop and no friction rub.  Murmur: 2/6 SEM at RUSB--neck. Pulmonary/Chest: Effort normal and breath sounds normal. No respiratory distress. She has no wheezes. She has no rales.  Abdominal: Soft. Bowel sounds are normal. She exhibits no distension. There is no abdominal tenderness. There is no rebound.  Musculoskeletal: Normal range of motion.        General: Edema (1-2+ left leg from calf to ankle swelling, trivial right) present.  Neurological: She is alert and oriented to person, place, and time.  Psychiatric: She has a normal mood and affect. Her behavior is normal. Judgment and thought content normal.  Vitals reviewed.   Adult ECG Report Not checked  Recent Labs:  Lab Results  Component Value Date   CHOL 185 02/16/2018   HDL 62 02/16/2018   LDLCALC 113 (H) 02/16/2018   TRIG 48 02/16/2018   CHOLHDL 3.0 02/16/2018   Lab Results  Component Value Date   CREATININE 1.00 10/14/2018   BUN 18 10/14/2018   NA 140 10/14/2018   K 4.2 10/14/2018   CL 107 10/14/2018   CO2 23 10/14/2018    ASSESSMENT/PLAN   Problem List Items Addressed This Visit    Essential hypertension; borderline (Chronic)    Blood pressure well controlled today.  Would not be more aggressive  with her having some balance issues.  She is not currently on blood pressure medications.  Plan is to avoid treatment if possible.      Relevant Medications   pravastatin (PRAVACHOL) 20 MG tablet   furosemide (LASIX) 20 MG tablet   H/O hypercholesterolemia (Chronic)  We will start her back on pravastatin based on TIA.  LDL was 113 at the time of her TIA.  Will defer follow-up to PCP.      Palpitations (Chronic)    No longer feeling the little bursts of fluttering that she had had.  They seem to settle down.  May be related to less anxiety.      Edema of left lower extremity - Primary (Chronic)    This is previously been evaluated before, I suspect that she may have some small vessel reflux.  We will check for DVT and progression of reflux. Otherwise recommend support stocking and as needed Lasix 20 mg.      Relevant Medications   furosemide (LASIX) 20 MG tablet   Other Relevant Orders   VAS [place] LOWER EXTREMITY VENOUS (DVT)   TIA (transient ischemic attack) (Chronic)    No further episodes.  Nothing to suggest A. fib.  Other evaluation was negative.  Was started on statin and aspirin.      Relevant Medications   pravastatin (PRAVACHOL) 20 MG tablet   furosemide (LASIX) 20 MG tablet       COVID-19 Education: The signs and symptoms of COVID-19 were discussed with the patient and how to seek care for testing (follow up with PCP or arrange E-visit).   The importance of social distancing was discussed today.  I spent a total of 17minutes with the patient and chart review. >  50% of the time was spent in direct patient consultation.  Additional time spent with chart review (studies, outside notes, etc): 6 Total Time: 21 min   Current medicines are reviewed at length with the patient today.  (+/- concerns) she asked if she needs to keep taking statin   Patient Instructions / Medication Changes & Studies & Tests Ordered   Patient Instructions  Medication Instructions:   START FUROSEMIDE 20 MG ONCE DAILY AS NEEDED FOR SWELLING  *If you need a refill on your cardiac medications before your next appointment, please call your pharmacy*  Lab Work: If you have labs (blood work) drawn today and your tests are completely normal, you will receive your results only by:  [place] (if you have MyChart) OR  A paper copy in the mail If you have any lab test that is abnormal or we need to change your treatment, we will call you to review the results.  Testing/Procedures: Your physician has requested that you have a lower extremity venous duplex. This test is an ultrasound of the veins in the legs or arms. It looks at venous blood flow that carries blood from the heart to the legs or arms. Allow one hour for a Lower Venous exam. Allow thirty minutes for an Upper Venous exam. There are no restrictions or special instructions.  Follow-Up: At [facility], you and your health needs are our priority.  As part of our continuing mission to provide you with exceptional heart care, we have created designated Provider Care Teams.  These Care Teams include your primary Cardiologist (physician) and Advanced Practice Providers (APPs -  Physician Assistants and Nurse Practitioners) who all work together to provide you with the care you need, when you need it.  Your next appointment:   12 months  The format for your next appointment:   In Person  Provider:   Glenetta Hew, MD      Studies Ordered:   Orders Placed This Encounter  Procedures   VAS [place] LOWER EXTREMITY VENOUS (DVT)  Glenetta Hew, M.D., M.S. Interventional Cardiologist   Pager # [phone] Phone # [phone] [address] [place], [place] [address]   Thank you for choosing Heartcare at [facility]!!

## 2018-12-01 ENCOUNTER — Encounter: Payer: Self-pay | Admitting: Cardiology

## 2018-12-01 NOTE — Assessment & Plan Note (Signed)
This is previously been evaluated before, I suspect that she may have some small vessel reflux.  We will check for DVT and progression of reflux. Otherwise recommend support stocking and as needed Lasix 20 mg.

## 2018-12-01 NOTE — Assessment & Plan Note (Signed)
No further episodes.  Nothing to suggest A. fib.  Other evaluation was negative.  Was started on statin and aspirin.

## 2018-12-01 NOTE — Assessment & Plan Note (Signed)
No longer feeling the little bursts of fluttering that she had had.  They seem to settle down.  May be related to less anxiety.

## 2018-12-01 NOTE — Assessment & Plan Note (Signed)
We will start her back on pravastatin based on TIA.  LDL was 113 at the time of her TIA.  Will defer follow-up to PCP.

## 2018-12-01 NOTE — Assessment & Plan Note (Addendum)
Blood pressure well controlled today.  Would not be more aggressive with her having some balance issues.  She is not currently on blood pressure medications.  Plan is to avoid treatment if possible.

## 2018-12-18 ENCOUNTER — Ambulatory Visit (HOSPITAL_COMMUNITY)
Admission: RE | Admit: 2018-12-18 | Discharge: 2018-12-18 | Disposition: A | Discharge status: Home / Self Care | Payer: Medicare Other | Source: Ambulatory Visit | Attending: Cardiology | Admitting: Cardiology

## 2018-12-18 ENCOUNTER — Other Ambulatory Visit: Payer: Self-pay

## 2018-12-18 DIAGNOSIS — R600 Localized edema: Secondary | ICD-10-CM

## 2018-12-19 ENCOUNTER — Telehealth: Payer: Self-pay | Admitting: *Deleted

## 2018-12-19 NOTE — Telephone Encounter (Signed)
-----   Message from Leonie Man, MD sent at 12/19/2018 12:52 PM EST ----- Study does show evidence of chronic blood clot in the left leg veins just below the knee.  All of the other veins look normal.  I would like to start [name] on Eliquis for DVT.  I am forwarding to our clinical pharmacist in order to determine the appropriate dose based on her age and kidney function.  Treatment would be 10 mg p.o. twice daily for 7 days then 5 mg p.o. twice daily.  We will plan to treat for 3 months and reassess.  Glenetta Hew, MD  Rx: Eliquis 5 mg tablet, 10 mg (2 tabs) twice daily x7 days; then 5 mg (1 tablet) twice daily; dispense #180 tab, 1 refill

## 2018-12-19 NOTE — Telephone Encounter (Signed)
Called  No answer , will try again

## 2018-12-21 NOTE — Telephone Encounter (Signed)
Called no answer. Left message to returrn call about doppler.

## 2018-12-24 ENCOUNTER — Ambulatory Visit (INDEPENDENT_AMBULATORY_CARE_PROVIDER_SITE_OTHER): Payer: Medicare Other | Admitting: Cardiology

## 2018-12-24 ENCOUNTER — Encounter: Payer: Self-pay | Admitting: Cardiology

## 2018-12-24 ENCOUNTER — Other Ambulatory Visit: Payer: Self-pay

## 2018-12-24 VITALS — BP 145/88 | HR 98 | Ht 63.0 in | Wt 132.1 lb

## 2018-12-24 DIAGNOSIS — I824Z2 Acute embolism and thrombosis of unspecified deep veins of left distal lower extremity: Secondary | ICD-10-CM

## 2018-12-24 DIAGNOSIS — I10 Essential (primary) hypertension: Secondary | ICD-10-CM | POA: Diagnosis not present

## 2018-12-24 DIAGNOSIS — I350 Nonrheumatic aortic (valve) stenosis: Secondary | ICD-10-CM | POA: Insufficient documentation

## 2018-12-24 DIAGNOSIS — I351 Nonrheumatic aortic (valve) insufficiency: Secondary | ICD-10-CM | POA: Insufficient documentation

## 2018-12-24 DIAGNOSIS — R600 Localized edema: Secondary | ICD-10-CM

## 2018-12-24 DIAGNOSIS — I825Z9 Chronic embolism and thrombosis of unspecified deep veins of unspecified distal lower extremity: Secondary | ICD-10-CM | POA: Insufficient documentation

## 2018-12-24 HISTORY — DX: Chronic embolism and thrombosis of unspecified deep veins of unspecified distal lower extremity: I82.5Z9

## 2018-12-24 MED ORDER — APIXABAN 5 MG PO TABS: ORAL_TABLET | ORAL | 0 refills | Status: DC

## 2018-12-24 MED ORDER — APIXABAN 5 MG PO TABS: 5.0000 mg | ORAL_TABLET | Freq: Two times a day (BID) | ORAL | 3 refills | Status: DC

## 2018-12-24 NOTE — Patient Instructions (Signed)
Medication Instructions:   DO NOT TAKE ASPIRIN  WHILE TAKING  ELIQUIS FOR THE FIRST 7 DAYS -TAKE 10 MG ( 2 TABLETS OF 5 MG) TWICE A DAY , THEN TAKE 5 MG TWICE A DAY .   *If you need a refill on your cardiac medications before your next appointment, please call your pharmacy*  Lab Work: NOT NEEDED  Testing/Procedures: WILL BE SCHEDULE AT [place] physician has requested that you have a lower  extremity venous duplex. This test is an ultrasound of the veins in the legs. It looks at venous blood flow that carries blood from the heart to the legs . Allow one hour for a Lower Venous exam. There are no restrictions or special instructions.    Follow-Up: At [facility], you and your health needs are our priority.  As part of our continuing mission to provide you with exceptional heart care, we have created designated Provider Care Teams.  These Care Teams include your primary Cardiologist (physician) and Advanced Practice Providers (APPs -  Physician Assistants and Nurse Practitioners) who all work together to provide you with the care you need, when you need it.  Your next appointment:   4 month(s)  The format for your next appointment:   Virtual Visit   Provider:   Glenetta Hew, MD  Other Instructions

## 2018-12-24 NOTE — Telephone Encounter (Signed)
Late entry spoke to patient earlier today --  She would prefer to talk to doctor - appointment made for 12/24/18.

## 2018-12-24 NOTE — Telephone Encounter (Signed)
Follow up    Patient is returning call in reference to doppler results.

## 2018-12-24 NOTE — Telephone Encounter (Signed)
Called pt about doppler results. Pt did not like the idea of taking Eliquis. Pt stated that she wants to talk to Dr Ellyn Hack before starting the mediaction. Advised pt that the message will be sent to Dr Ellyn Hack for review.

## 2018-12-24 NOTE — Progress Notes (Signed)
Primary Care Provider: Lavone Orn, MD Cardiologist: Glenetta Hew, MD Electrophysiologist:   Clinic Note: Chief Complaint  Patient presents with  . Follow-up    Doppler results showing DVT    HPI:    [name] is a 85 y.o. female with a PMH below who presents today for follow-up of recent lower extremity venous Doppler showing left peroneal vein DVT.  She had a recent TIA the spring and had a relatively normal echo and carotid Doppler.  No further episodes since.  Echo showed aortic sclerosis but no stenosis.  Erick P Hamada was last seen on 11/29/2018 complaining of left lower extremity edema and discomfort.  Otherwise, she is feeling fine from a cardiac standpoint.  Was having some balance issues but doing better.  Evaluated with extremity venous Doppler.  Recent Hospitalizations:   n/a  Reviewed  CV studies:    The following studies were reviewed today: (if available, images/films reviewed: From Epic Chart or Care Everywhere) . LE Venous duplex:  subacute/Chronic DVT in L peroneal veins.    Interval History:   [name] returns here today for in person discussion of the results of her monitor.  She was uncomfortable starting Eliquis without being seen to discuss results and have questions answered.  She says actually that the left leg is less swollen than it had been, but there is still some discomfort that can goes from behind the knee up to the thigh.  The swelling is definitely improved however.  Otherwise regarding standpoint she is doing well.  No further syncope or near syncope, TIA or amaurosis fugax.  No resting exertional dyspnea, PND, orthopnea or edema.  No chest pain or pressure with rest or exertion.   Remainder of CV Review of Symptoms (Summary)  no chest pain or dyspnea on exertion negative  Left leg swelling much better.  The patient DOES NOT have symptoms concerning for COVID-19 infection (fever, chills, cough, or new shortness of breath).   The patient is practicing social distancing. ++ Masking.  She does not routinely go out for shopping.  She is try to get more active though.   REVIEWED OF SYSTEMS   Review of Systems  Constitutional: Negative for malaise/fatigue.  Gastrointestinal: Negative for blood in stool and melena.  Genitourinary: Negative for flank pain and hematuria.  Musculoskeletal: Negative for falls and joint pain.       Left leg hurts as noted in HPI.  Neurological: Positive for dizziness (Still has some balance issues, but no falls.).  Endo/Heme/Allergies: Bruises/bleeds easily.   I have reviewed and (if needed) personally updated the patient's problem list, medications, allergies, past medical and surgical history, social and family history.   PAST MEDICAL HISTORY   Past Medical History:  Diagnosis Date  . Abnormal Pap smear 1975-76  . Anemia    history of  . Arthritis    spine  . Breast cyst, left 1980  . Cystocele 2012  . Diverticulosis    with h/o Diverticulitis  . GERD (gastroesophageal reflux disease)   . H/O hypercholesterolemia   . H/O osteopenia   . H/O varicella   . H/O varicose veins   . Hx of Mumps   . Macular degeneration   . Seasonal allergies   . Urge incontinence 2012  . Urinary frequency 2010     PAST SURGICAL HISTORY   Past Surgical History:  Procedure Laterality Date  . ABDOMINAL HYSTERECTOMY    . BLADDER SUSPENSION N/A 06/16/2016   Procedure:  TRANSVAGINAL TAPE (TVT) PROCEDURE;  Surgeon: Everett Graff, MD;  Location: [facility];  Service: Gynecology;  Laterality: N/A;  . BREAST CYST ASPIRATION  1964  . COLONOSCOPY     Dr. Earlean Shawl  . CYSTOCELE REPAIR N/A 06/16/2016   Procedure: ANTERIOR REPAIR (CYSTOCELE);  Surgeon: Everett Graff, MD;  Location: [facility];  Service: Gynecology;  Laterality: N/A;  . CYSTOSCOPY N/A 06/16/2016   Procedure: CYSTOSCOPY;  Surgeon: Everett Graff, MD;  Location: [facility];  Service: Gynecology;  Laterality: N/A;  see anterior repair  . Lower  Extremity Venous Dopplers  01/09/2012   Right and left lower steroids: No evidence of thrombus or, thrombophlebitis; right and left GSV and SSV: No venous insufficiency. Normal exam.  . NM MYOVIEW LTD  11/2016   6.6 METS.  Reached 106% of max.  Heart rate.  Walk for 4:40 min.  EF 72%.  Normal blood pressure response.  upsloping ST segment depression, nonspecific.  Otherwise normal study.  LOW RISK.  No evidence of ischemia or infarction.  . TRANSTHORACIC ECHOCARDIOGRAM  01/2018   EF > 65%. LV size small. Gr 1 DD (normal for age).   Normal atrial size. Moderate Aortic Sclerosis without Stenosis. Moderate Mitral Annular Calcification with mild MR & no MS    14-day ZIO patch monitor:   Predominant underlying rhythm sinus rhythm. Minimum heart rate 51 bpm. Average heart rate 88 bpm.. Max sinus tachycardia 151 Bpm.  1 4-5 beat run of NSVT with max rate 184 bpm  21 episodes of SVT --> longest lasted 15 beats average rate 153 Bpm  Max recorded heart rate -4 beat run of PAT/SVT (203 bpm)  Rare PACs and PVCs. - consider ILR  MEDICATIONS/ALLERGIES   Current Meds  Medication Sig  . acetaminophen (TYLENOL) 500 MG tablet Take 1,000 mg by mouth every 8 (eight) hours as needed for mild pain.  Marland Kitchen aspirin EC 81 MG tablet Take 1 tablet (81 mg total) by mouth daily.  . cholecalciferol (VITAMIN D) 1000 units tablet Take 1,000 Units by mouth daily.  . Lactobacillus Rhamnosus, GG, (CULTURELLE) CAPS Take 1 capsule by mouth every morning.   . Magnesium 250 MG TABS Take 250 mg by mouth daily as needed (leg cramps).  . meclizine (ANTIVERT) 25 MG tablet Take 1 tablet (25 mg total) by mouth 2 (two) times daily as needed for dizziness.  . Multiple Vitamin (MULTIVITAMIN WITH MINERALS) TABS Take 1 tablet by mouth daily.  . Multiple Vitamins-Minerals (HAIR VITAMINS) TABS Take 1 tablet by mouth 3 (three) times a week. Hair supplement called Hair Volume  . pravastatin (PRAVACHOL) 20 MG tablet pravastatin sodium  20  mg tabs  . Trospium Chloride 60 MG CP24 Take 1 capsule by mouth daily.    Allergies  Allergen Reactions  . Ivp Dye [Iodinated Diagnostic Agents] Hives  . Myrbetriq [Mirabegron] Other (See Comments)    "Makes me urinate more frequently"     SOCIAL HISTORY/FAMILY HISTORY   Social History   Tobacco Use  . Smoking status: Never Smoker  . Smokeless tobacco: Never Used  Substance Use Topics  . Alcohol use: No  . Drug use: No   Social History   Social History Narrative   She is a married mother of 57, grandmother of 45, great grandmother of 51. She walks   daily about 2 miles a day, does not smoke, does not drink.     Family History family history includes Heart attack (age of onset: 51) in her brother; Uterine cancer (age  of onset: 39) in her mother.   OBJCTIVE -PE, EKG, labs   Wt Readings from Last 3 Encounters:  12/24/18 132 lb 1.6 oz (59.9 kg)  11/29/18 134 lb 9.6 oz (61.1 kg)  10/14/18 131 lb (59.4 kg)    Physical Exam: BP (!) 145/88   Pulse 98   Ht 5\' 3"  (1.6 m)   Wt 132 lb 1.6 oz (59.9 kg)   SpO2 98%   BMI 23.40 kg/m  Physical Exam  Constitutional: She is oriented to person, place, and time. She appears well-developed and well-nourished. No distress.  Well-groomed.  Healthy-appearing.  HENT:  Head: Normocephalic and atraumatic.  Musculoskeletal:        General: Edema (Trivial 1+ left leg swelling.  Mild tenderness in the popliteal and posterior thigh.) present.  Neurological: She is alert and oriented to person, place, and time.  Psychiatric: She has a normal mood and affect. Her behavior is normal. Judgment and thought content normal.    Adult ECG Report Not checked  Recent Labs:  Lab Results  Component Value Date   CHOL 185 02/16/2018   HDL 62 02/16/2018   LDLCALC 113 (H) 02/16/2018   TRIG 48 02/16/2018   CHOLHDL 3.0 02/16/2018   Lab Results  Component Value Date   CREATININE 1.00 10/14/2018   BUN 18 10/14/2018   NA 140 10/14/2018   K 4.2  10/14/2018   CL 107 10/14/2018   CO2 23 10/14/2018    ASSESSMENT/PLAN   Problem List Items Addressed This Visit    Essential hypertension; borderline (Chronic)    Blood pressure excellent is acceptable for her given her age and history of dizziness and orthostasis.      Relevant Medications   apixaban (ELIQUIS) 5 MG TABS tablet   apixaban (ELIQUIS) 5 MG TABS tablet   Edema of left lower extremity - Primary (Chronic)    Interestingly, she did have a DVT in the peroneal vein.  I spent most of the visit explaining what this means and that it does not have any due to the TIA.  Of course there could have been a PFO, but that was not seen on echocardiogram.  Plan for now is hold off on support stockings until treated for least 1 month on Eliquis and then restart support stocking.  As needed Lasix.  She probably will have trouble with stasis swelling going forward, and I suspect that she would benefit from continued use of lower extremity support hose.      Relevant Orders   VAS [place] LOWER EXTREMITY VENOUS (DVT)   DVT, lower extremity, distal, acute, left (HCC)    I think probably we got the Dopplers done a little bit after her onset of symptoms and therefore it has appearance of probably subacute or chronic thrombus with cannulization.  Plan:   We will initiate Eliquis per protocol for DVT.  We will plan to treat for at least 3 months  reassess Dopplers in roughly 3 to 4 months to determine if we continued for 6 months.  After 1 month, recommend foot elevation and support stockings.  Stop aspirin while on Eliquis      Relevant Medications   apixaban (ELIQUIS) 5 MG TABS tablet   apixaban (ELIQUIS) 5 MG TABS tablet   Other Relevant Orders   VAS [place] LOWER EXTREMITY VENOUS (DVT)      COVID-19 Education: The signs and symptoms of COVID-19 were discussed with the patient and how to seek care for testing (follow up with  PCP or arrange E-visit).   The importance of social distancing  was discussed today.  I spent a total of 10 minutes with the patient and chart review. >  50% of the time was spent in direct patient consultation.  Additional time spent with chart review (studies, outside notes, etc):5 Total Time: 15 min   Current medicines are reviewed at length with the patient today.  (+/- concerns) -was not comfortable starting blood thinner until she came in here today.  Patient Instructions / Medication Changes & Studies & Tests Ordered   Patient Instructions  Medication Instructions:   DO NOT TAKE ASPIRIN  WHILE TAKING  ELIQUIS FOR THE FIRST 7 DAYS -TAKE 10 MG ( 2 TABLETS OF 5 MG) TWICE A DAY , THEN TAKE 5 MG TWICE A DAY .   *If you need a refill on your cardiac medications before your next appointment, please call your pharmacy*  Lab Work: NOT NEEDED  Testing/Procedures: WILL BE SCHEDULE AT [facility] physician has requested that you have a lower  extremity venous duplex. This test is an ultrasound of the veins in the legs. It looks at venous blood flow that carries blood from the heart to the legs . Allow one hour for a Lower Venous exam. There are no restrictions or special instructions.    Follow-Up: At [facility], you and your health needs are our priority.  As part of our continuing mission to provide you with exceptional heart care, we have created designated Provider Care Teams.  These Care Teams include your primary Cardiologist (physician) and Advanced Practice Providers (APPs -  Physician Assistants and Nurse Practitioners) who all work together to provide you with the care you need, when you need it.  Your next appointment:   4 month(s)  The format for your next appointment:   Virtual Visit   Provider:   Glenetta Hew, MD  Other Instructions     Studies Ordered:   Orders Placed This Encounter  Procedures  . VAS [place] LOWER EXTREMITY VENOUS (DVT)     Glenetta Hew, M.D., M.S. Interventional  Cardiologist   Pager # [phone] Phone # [phone] [address]. [place], [place] [address]   Thank you for choosing [facility] at [facility]!!

## 2018-12-24 NOTE — Assessment & Plan Note (Signed)
Blood pressure excellent is acceptable for her given her age and history of dizziness and orthostasis.

## 2018-12-24 NOTE — Assessment & Plan Note (Signed)
Interestingly, she did have a DVT in the peroneal vein.  I spent most of the visit explaining what this means and that it does not have any due to the TIA.  Of course there could have been a PFO, but that was not seen on echocardiogram.  Plan for now is hold off on support stockings until treated for least 1 month on Eliquis and then restart support stocking.  As needed Lasix.  She probably will have trouble with stasis swelling going forward, and I suspect that she would benefit from continued use of lower extremity support hose.

## 2018-12-24 NOTE — Assessment & Plan Note (Addendum)
I think probably we got the Dopplers done a little bit after her onset of symptoms and therefore it has appearance of probably subacute or chronic thrombus with cannulization.  Plan:   We will initiate Eliquis per protocol for DVT.  We will plan to treat for at least 3 months  reassess Dopplers in roughly 3 to 4 months to determine if we continued for 6 months.  After 1 month, recommend foot elevation and support stockings.  Stop aspirin while on Eliquis

## 2018-12-27 ENCOUNTER — Encounter (INDEPENDENT_AMBULATORY_CARE_PROVIDER_SITE_OTHER): Payer: Medicare Other | Admitting: Ophthalmology

## 2018-12-27 ENCOUNTER — Other Ambulatory Visit: Payer: Self-pay

## 2018-12-27 DIAGNOSIS — D3132 Benign neoplasm of left choroid: Secondary | ICD-10-CM

## 2018-12-27 DIAGNOSIS — H353122 Nonexudative age-related macular degeneration, left eye, intermediate dry stage: Secondary | ICD-10-CM

## 2018-12-27 DIAGNOSIS — H43813 Vitreous degeneration, bilateral: Secondary | ICD-10-CM

## 2018-12-27 DIAGNOSIS — H353211 Exudative age-related macular degeneration, right eye, with active choroidal neovascularization: Secondary | ICD-10-CM

## 2018-12-31 ENCOUNTER — Other Ambulatory Visit: Payer: Self-pay | Admitting: Obstetrics and Gynecology

## 2018-12-31 ENCOUNTER — Other Ambulatory Visit: Payer: Self-pay | Admitting: Internal Medicine

## 2018-12-31 DIAGNOSIS — Z872 Personal history of diseases of the skin and subcutaneous tissue: Secondary | ICD-10-CM

## 2018-12-31 DIAGNOSIS — N6489 Other specified disorders of breast: Secondary | ICD-10-CM

## 2019-01-08 ENCOUNTER — Other Ambulatory Visit: Payer: Self-pay | Admitting: Obstetrics and Gynecology

## 2019-01-08 DIAGNOSIS — R922 Inconclusive mammogram: Secondary | ICD-10-CM

## 2019-01-08 DIAGNOSIS — Z872 Personal history of diseases of the skin and subcutaneous tissue: Secondary | ICD-10-CM

## 2019-01-08 DIAGNOSIS — N6489 Other specified disorders of breast: Secondary | ICD-10-CM

## 2019-01-09 ENCOUNTER — Ambulatory Visit
Admission: RE | Admit: 2019-01-09 | Discharge: 2019-01-09 | Disposition: A | Discharge status: Home / Self Care | Payer: Medicare Other | Source: Ambulatory Visit | Attending: Obstetrics and Gynecology | Admitting: Obstetrics and Gynecology

## 2019-01-09 ENCOUNTER — Ambulatory Visit: Payer: Medicare Other

## 2019-01-09 ENCOUNTER — Other Ambulatory Visit: Payer: Self-pay

## 2019-01-09 DIAGNOSIS — Z872 Personal history of diseases of the skin and subcutaneous tissue: Secondary | ICD-10-CM

## 2019-01-09 DIAGNOSIS — N6489 Other specified disorders of breast: Secondary | ICD-10-CM

## 2019-01-09 DIAGNOSIS — R922 Inconclusive mammogram: Secondary | ICD-10-CM

## 2019-01-18 ENCOUNTER — Emergency Department (HOSPITAL_COMMUNITY): Payer: Medicare PPO

## 2019-01-18 ENCOUNTER — Encounter (HOSPITAL_COMMUNITY): Payer: Self-pay | Admitting: Emergency Medicine

## 2019-01-18 ENCOUNTER — Emergency Department (HOSPITAL_COMMUNITY)
Admission: EM | Admit: 2019-01-18 | Discharge: 2019-01-18 | Disposition: A | Discharge status: Home / Self Care | Payer: Medicare PPO | Attending: Emergency Medicine | Admitting: Emergency Medicine

## 2019-01-18 DIAGNOSIS — U071 COVID-19: Secondary | ICD-10-CM | POA: Diagnosis not present

## 2019-01-18 DIAGNOSIS — R438 Other disturbances of smell and taste: Secondary | ICD-10-CM | POA: Insufficient documentation

## 2019-01-18 DIAGNOSIS — R519 Headache, unspecified: Secondary | ICD-10-CM | POA: Diagnosis not present

## 2019-01-18 DIAGNOSIS — Z7982 Long term (current) use of aspirin: Secondary | ICD-10-CM | POA: Diagnosis not present

## 2019-01-18 DIAGNOSIS — Z79899 Other long term (current) drug therapy: Secondary | ICD-10-CM | POA: Diagnosis not present

## 2019-01-18 DIAGNOSIS — Z8673 Personal history of transient ischemic attack (TIA), and cerebral infarction without residual deficits: Secondary | ICD-10-CM | POA: Insufficient documentation

## 2019-01-18 DIAGNOSIS — Z7901 Long term (current) use of anticoagulants: Secondary | ICD-10-CM | POA: Insufficient documentation

## 2019-01-18 DIAGNOSIS — R0981 Nasal congestion: Secondary | ICD-10-CM | POA: Insufficient documentation

## 2019-01-18 DIAGNOSIS — R05 Cough: Secondary | ICD-10-CM | POA: Insufficient documentation

## 2019-01-18 DIAGNOSIS — R070 Pain in throat: Secondary | ICD-10-CM | POA: Diagnosis not present

## 2019-01-18 DIAGNOSIS — R5381 Other malaise: Secondary | ICD-10-CM | POA: Diagnosis present

## 2019-01-18 LAB — CBC WITH DIFFERENTIAL/PLATELET
Abs Immature Granulocytes: 0.01 10*3/uL (ref 0.00–0.07)
Basophils Absolute: 0 10*3/uL (ref 0.0–0.1)
Basophils Relative: 1 %
Eosinophils Absolute: 0 10*3/uL (ref 0.0–0.5)
Eosinophils Relative: 1 %
HCT: 33.3 % — ABNORMAL LOW (ref 36.0–46.0)
Hemoglobin: 11.6 g/dL — ABNORMAL LOW (ref 12.0–15.0)
Immature Granulocytes: 0 %
Lymphocytes Relative: 47 %
Lymphs Abs: 1.7 10*3/uL (ref 0.7–4.0)
MCH: 29.7 pg (ref 26.0–34.0)
MCHC: 34.8 g/dL (ref 30.0–36.0)
MCV: 85.2 fL (ref 80.0–100.0)
Monocytes Absolute: 0.4 10*3/uL (ref 0.1–1.0)
Monocytes Relative: 10 %
Neutro Abs: 1.5 10*3/uL — ABNORMAL LOW (ref 1.7–7.7)
Neutrophils Relative %: 41 %
Platelets: 155 10*3/uL (ref 150–400)
RBC: 3.91 MIL/uL (ref 3.87–5.11)
RDW: 12.9 % (ref 11.5–15.5)
WBC: 3.7 10*3/uL — ABNORMAL LOW (ref 4.0–10.5)
nRBC: 0 % (ref 0.0–0.2)

## 2019-01-18 LAB — COMPREHENSIVE METABOLIC PANEL WITH GFR
ALT: 15 U/L (ref 0–44)
AST: 26 U/L (ref 15–41)
Albumin: 3.7 g/dL (ref 3.5–5.0)
Alkaline Phosphatase: 41 U/L (ref 38–126)
Anion gap: 10 (ref 5–15)
BUN: 15 mg/dL (ref 8–23)
CO2: 24 mmol/L (ref 22–32)
Calcium: 8.8 mg/dL — ABNORMAL LOW (ref 8.9–10.3)
Chloride: 105 mmol/L (ref 98–111)
Creatinine, Ser: 1.06 mg/dL — ABNORMAL HIGH (ref 0.44–1.00)
GFR calc Af Amer: 54 mL/min — ABNORMAL LOW
GFR calc non Af Amer: 47 mL/min — ABNORMAL LOW
Glucose, Bld: 100 mg/dL — ABNORMAL HIGH (ref 70–99)
Potassium: 3.7 mmol/L (ref 3.5–5.1)
Sodium: 139 mmol/L (ref 135–145)
Total Bilirubin: 0.7 mg/dL (ref 0.3–1.2)
Total Protein: 6.7 g/dL (ref 6.5–8.1)

## 2019-01-18 LAB — LACTIC ACID, PLASMA: Lactic Acid, Venous: 0.9 mmol/L (ref 0.5–1.9)

## 2019-01-18 LAB — POC SARS CORONAVIRUS 2 AG -  ED: SARS Coronavirus 2 Ag: POSITIVE — AB

## 2019-01-18 MED ORDER — SODIUM CHLORIDE 0.9% FLUSH: 3.0000 mL | Freq: Once | INTRAVENOUS | Status: DC

## 2019-01-18 NOTE — ED Triage Notes (Signed)
Pt to ER - reports wants to be checked for COVID19. Reports feeling weak, fatigued, and loss of smell for the past few days. States was cutting an onion this morning and couldn't smell it. She is in NAD at this time. Reports productive cough. States husband is ill with similar symptoms.

## 2019-01-18 NOTE — ED Provider Notes (Signed)
[facility] EMERGENCY DEPARTMENT Provider Note   CSN: [id number] Arrival date & time: 01/18/19  1137     History Chief Complaint  Patient presents with  . Weakness    [name] is a 86 y.o. female presents to the ER for evaluation of overall malaise associated with right-sided headache, mildly productive cough, sore throat, loss of taste and smell, nasal congestion and rhinorrhea for the last 1 week.  Reports history of seasonal allergies but these have felt worse in the last 1 week.  Her right scalp feels sore to the touch and the pain is "electric" intermittent and worse with palpation.  H/o of shingles on chest but denies any scalp or facial lesions, blisters or rash.  She has taken Tylenol and gargled vinegar and water and this has helped her sore throat and headache.  Has had grandchildren visit her recently but denies sick contacts.  No travel.  COVID-19.  Her husband has similar symptoms.  Denies fever, chest pain or shortness of breath, vomiting, diarrhea or abdominal pain.  Chronic urinary frequency that is unchanged.     HPI     Past Medical History:  Diagnosis Date  . Abnormal Pap smear 1975-76  . Anemia    history of  . Arthritis    spine  . Breast cyst, left 1980  . Cystocele 2012  . Diverticulosis    with h/o Diverticulitis  . GERD (gastroesophageal reflux disease)   . H/O hypercholesterolemia   . H/O osteopenia   . H/O varicella   . H/O varicose veins   . Hx of Mumps   . Macular degeneration   . Seasonal allergies   . Urge incontinence 2012  . Urinary frequency 2010    Patient Active Problem List   Diagnosis Date Noted  . DVT, lower extremity, distal, acute, left ([place]) 12/24/2018  . Palpitations 05/30/2018  . Irregular heartbeat 03/02/2018  . TIA (transient ischemic attack) 02/15/2018  . Urinary, incontinence, stress female 06/16/2016  . Right carotid bruit 12/23/2014  . Exertional dyspnea 12/24/2013  . Bilateral arm numbness  and tingling while sleeping - and shortly after waking 12/24/2013  . Poor balance 12/24/2013  . Edema of left lower extremity 12/21/2012  . Essential hypertension; borderline   . H/O hypercholesterolemia   . Osteoporosis - of the spine 09/22/2011    Past Surgical History:  Procedure Laterality Date  . ABDOMINAL HYSTERECTOMY    . BLADDER SUSPENSION N/A 06/16/2016   Procedure: TRANSVAGINAL TAPE (TVT) PROCEDURE;  Surgeon: Everett Graff, MD;  Location: [facility];  Service: Gynecology;  Laterality: N/A;  . BREAST CYST ASPIRATION  1964  . COLONOSCOPY     Dr. Earlean Shawl  . CYSTOCELE REPAIR N/A 06/16/2016   Procedure: ANTERIOR REPAIR (CYSTOCELE);  Surgeon: Everett Graff, MD;  Location: [facility];  Service: Gynecology;  Laterality: N/A;  . CYSTOSCOPY N/A 06/16/2016   Procedure: CYSTOSCOPY;  Surgeon: Everett Graff, MD;  Location: [facility];  Service: Gynecology;  Laterality: N/A;  see anterior repair  . Lower Extremity Venous Dopplers  01/09/2012   Right and left lower steroids: No evidence of thrombus or, thrombophlebitis; right and left GSV and SSV: No venous insufficiency. Normal exam.  . NM MYOVIEW LTD  11/2016   6.6 METS.  Reached 106% of max.  Heart rate.  Walk for 4:40 min.  EF 72%.  Normal blood pressure response.  upsloping ST segment depression, nonspecific.  Otherwise normal study.  LOW RISK.  No evidence of ischemia or  infarction.  . TRANSTHORACIC ECHOCARDIOGRAM  01/2018   EF > 65%. LV size small. Gr 1 DD (normal for age).   Normal atrial size. Moderate Aortic Sclerosis without Stenosis. Moderate Mitral Annular Calcification with mild MR & no MS     OB History    Gravida  3   Para  3   Term  3   Preterm      AB      Living  3     SAB      TAB      Ectopic      Multiple      Live Births  3           Family History  Problem Relation Age of Onset  . Uterine cancer Mother 48  . Heart attack Brother 82    Social History   Tobacco Use  . Smoking status: Never  Smoker  . Smokeless tobacco: Never Used  Substance Use Topics  . Alcohol use: No  . Drug use: No    Home Medications Prior to Admission medications   Medication Sig Start Date End Date Taking? Authorizing Provider  acetaminophen (TYLENOL) 500 MG tablet Take 1,000 mg by mouth every 8 (eight) hours as needed for mild pain.    [provider]  apixaban (ELIQUIS) 5 MG TABS tablet Take 1 tablet (5 mg total) by mouth 2 (two) times daily. 12/24/18   Leonie Man, MD  apixaban (ELIQUIS) 5 MG TABS tablet TAKE 10 MG ( 2 TABLETS OF 5 MG) TWICE A DAY  FOR 7 DAYS ONLY 12/24/18   Leonie Man, MD  aspirin EC 81 MG tablet Take 1 tablet (81 mg total) by mouth daily. 02/16/18 02/16/19  DanfordSuann Larry, MD  cholecalciferol (VITAMIN D) 1000 units tablet Take 1,000 Units by mouth daily.    [provider]  Lactobacillus Rhamnosus, GG, (CULTURELLE) CAPS Take 1 capsule by mouth every morning.     [provider]  Magnesium 250 MG TABS Take 250 mg by mouth daily as needed (leg cramps).    [provider]  meclizine (ANTIVERT) 25 MG tablet Take 1 tablet (25 mg total) by mouth 2 (two) times daily as needed for dizziness. 05/31/18   Julianne Rice, MD  Multiple Vitamin (MULTIVITAMIN WITH MINERALS) TABS Take 1 tablet by mouth daily.    [provider]  Multiple Vitamins-Minerals (HAIR VITAMINS) TABS Take 1 tablet by mouth 3 (three) times a week. Hair supplement called Hair Volume    [provider]  pravastatin (PRAVACHOL) 20 MG tablet pravastatin sodium  20 mg tabs    [provider]  Trospium Chloride 60 MG CP24 Take 1 capsule by mouth daily. 10/29/18   [provider]    Allergies    Ivp dye [iodinated diagnostic agents] and Myrbetriq [mirabegron]  Review of Systems   Review of Systems  Constitutional: Positive for fatigue.  HENT: Positive for congestion, postnasal drip, rhinorrhea, sinus pressure, sinus pain and sore throat  (resolved).   Respiratory: Positive for cough.   Neurological: Positive for headaches.  All other systems reviewed and are negative.   Physical Exam Updated Vital Signs BP (!) 161/73   Pulse 68   Temp 98.6 F (37 C) (Oral)   Resp 16   SpO2 98%   Physical Exam Vitals and nursing note reviewed.  Constitutional:      Appearance: She is well-developed.     Comments: Non toxic in NAD  HENT:  Head: Normocephalic and atraumatic.     Comments: Skin over her scalp is dry appearing with dandruff, slightly erythematous throughout.  Mild tenderness over right temporal, occipital scalp without abscess, contusion, wounds or hair abnormalities.  No lesions, rash.  No temporal tenderness.    Nose: Rhinorrhea present.     Mouth/Throat:     Pharynx: Posterior oropharyngeal erythema present.  Eyes:     Conjunctiva/sclera: Conjunctivae normal.  Neck:     Comments: C-spine is nontender, full range of motion without neck pain or rigidity Cardiovascular:     Rate and Rhythm: Normal rate and regular rhythm.  Pulmonary:     Effort: Pulmonary effort is normal.     Breath sounds: Normal breath sounds.  Abdominal:     General: Bowel sounds are normal.     Palpations: Abdomen is soft.     Tenderness: There is no abdominal tenderness.     Comments: No G/R/R. No suprapubic or CVA tenderness. Negative Murphy's and McBurney's. Active BS to lower quadrants.   Musculoskeletal:        General: Normal range of motion.     Cervical back: Normal range of motion.  Skin:    General: Skin is warm and dry.     Capillary Refill: Capillary refill takes less than 2 seconds.  Neurological:     Mental Status: She is alert.  Psychiatric:        Behavior: Behavior normal.     ED Results / Procedures / Treatments   Labs (all labs ordered are listed, but only abnormal results are displayed) Labs Reviewed  COMPREHENSIVE METABOLIC PANEL - Abnormal; Notable for the following components:      Result Value    Glucose, Bld 100 (*)    Creatinine, Ser 1.06 (*)    Calcium 8.8 (*)    GFR calc non Af Amer 47 (*)    GFR calc Af Amer 54 (*)    All other components within normal limits  CBC WITH DIFFERENTIAL/PLATELET - Abnormal; Notable for the following components:   WBC 3.7 (*)    Hemoglobin 11.6 (*)    HCT 33.3 (*)    Neutro Abs 1.5 (*)    All other components within normal limits  POC SARS CORONAVIRUS 2 AG -  ED - Abnormal; Notable for the following components:   SARS Coronavirus 2 Ag POSITIVE (*)    All other components within normal limits  LACTIC ACID, PLASMA  LACTIC ACID, PLASMA    EKG EKG Interpretation  Date/Time:  Friday January 18 2019 12:12:38 EST Ventricular Rate:  99 PR Interval:  130 QRS Duration: 108 QT Interval:  372 QTC Calculation: 477 R Axis:   -11 Text Interpretation: Normal sinus rhythm Right bundle branch block No significant change since last tracing Confirmed by Blanchie Dessert [phone]) on 01/18/2019 3:52:00 PM   Radiology DG Chest Portable 1 View  Result Date: 01/18/2019 CLINICAL DATA:  COVID-19 symptoms. EXAM: PORTABLE CHEST 1 VIEW COMPARISON:  October 14, 2018. FINDINGS: The heart size and mediastinal contours are within normal limits. Both lungs are clear. No pneumothorax or pleural effusion is noted. The visualized skeletal structures are unremarkable. IMPRESSION: No active disease. Electronically Signed   By: Marijo Conception M.D.   On: 01/18/2019 14:50    Procedures Procedures (including critical care time)  Medications Ordered in ED Medications  sodium chloride flush (NS) 0.9 % injection 3 mL (has no administration in time range)    ED Course  I have reviewed  the triage vital signs and the nursing notes.  Pertinent labs & imaging results that were available during my care of the patient were reviewed by me and considered in my medical decision making (see chart for details).    MDM Rules/Calculators/A&P                      I have reviewed  patient's EMR to obtain pertinent PMH to assist in MDM.  Symptoms and exam most suggestive of uncomplicated viral illness.   DDX incluldes viral URI including COVID-19 vs influenza vs other.  Considered strep, bacterial bronchitis or pneumonia unlikely given overall clinical picture. No travel. No known exposures to confirmed COVID-19.    Normal WOB. Normal VS.  No tachypnea, hypoxemia. Ambulatory without hypoxia.  Lungs are CTAB.   ER work up initiated in triage personally reviewed.   Labs show low WBC and neutrophils.  COVID positive which fits clinical picture. No lactic acidosis. Electrolytes normal. CXR unremarkable.    Patient is elderly but clinical exam is benign.  No signs of consolidation on auscultation and there is no hypoxia, increased WOB or other concerning features to exam. No clinical signs of severe illness, dehydration to warrant labs or IVF. No significant h/o immunocompromise, comorbidities, lung or heart disease to warrant more work up.   Given reassuring clinical findings/work up, will discharge with symptomatic treatment. Pt was evaluated in the context of the global COVID-19 pandemic.  We discussed patient's overall risk profile to develop complications.  PRecommended PCP f/u in the next 2-3 days for re-check to ensure to clinical decline, further guidance as needed. Self-isolation instructions at home discussed. Pt was given home self-isolation instructions and instructions for family members. Educated on signs and symptoms that would warrant return to ED.  Pt comfortable and agreeable with POC.  Shared with EDP who evaluated pt.   Final Clinical Impression(s) / ED Diagnoses Final diagnoses:  COVID-19 virus infection    Rx / DC Orders ED Discharge Orders    None       Kinnie Feil, PA-C 01/18/19 1629    Blanchie Dessert, MD 01/18/19 1954

## 2019-01-18 NOTE — Discharge Instructions (Addendum)
You were seen in the ED for symptoms concerning for COVID.   Your COVID test is POSITIVE.    Other testing done today was normal.   Treatment of your illness and symptoms for now will include self-isolation, monitoring of symptoms and supportive care with over-the-counter medicines.    Stay well-hydrated. Rest. You can use over the counter medications to help with symptoms: [phone] mg acetaminophen (tylenol) every 6 hours, around the clock to help with associated fevers, sore throat, headaches, generalized body aches and malaise.  Oxymetazoline (afrin) intranasal spray once daily for no more than 3 days to help with congestion, after 3 days you can switch to another over-the-counter nasal steroid spray such as fluticasone (flonase) Allergy medication (loratadine, cetirizine, etc) and phenylephrine (sudafed) help with nasal congestion, runny nose and postnasal drip.   Dextromethorphan (Delsym) to suppress dry cough. Frequent coughing is likely causing your chest wall pain Guaifenesin (Mucinex) to help expectorate mucus and cough Wash your hands often to prevent spread.  Stay hydrated with plenty of clear fluids Rest   Return to the ED if symptoms are worsening or severe, there is increased work of breathing, chest pain or shortness of breath with exertion or activity, inability to tolerate fluids due to persistent vomiting despite nausea medicines, passing out, light headedness.  If your test results are POSITIVE, the following isolation requirements need to be met to return to work and resume essential activities: At least 10 days since symptom onset  72 hours of absence of fever without antifever medicine (ibuprofen, acetaminophen). A fever is temperature of 100.80F or greater. Improvement of respiratory symptoms   Infection Prevention Recommendations for Individuals Confirmed to have, or Being Evaluated for, or have symptoms of 2019 Novel Coronavirus (COVID-19) Infection Who Receive  Care at Home  Individuals who are confirmed to have, or are being evaluated for, COVID-19 should follow the prevention steps below until a healthcare provider or local or state health department says they can return to normal activities.  Stay home except to get medical care You should restrict activities outside your home, except for getting medical care. Do not go to work, school, or public areas, and do not use public transportation or taxis.  Call ahead before visiting your doctor Before your medical appointment, call the healthcare provider and tell them that you have, or are being evaluated for, COVID-19 infection. This will help the healthcare provider's office take steps to keep other people from getting infected. Ask your healthcare provider to call the local or state health department.  Monitor your symptoms Seek prompt medical attention if your illness is worsening (e.g., difficulty breathing). Before going to your medical appointment, call the healthcare provider and tell them that you have, or are being evaluated for, COVID-19 infection. Ask your healthcare provider to call the local or state health department.  Wear a facemask You should wear a facemask that covers your nose and mouth when you are in the same room with other people and when you visit a healthcare provider. People who live with or visit you should also wear a facemask while they are in the same room with you.  Separate yourself from other people in your home As much as possible, you should stay in a different room from other people in your home. Also, you should use a separate bathroom, if available.  Avoid sharing household items You should not share dishes, drinking glasses, cups, eating utensils, towels, bedding, or other items with other people in your home.  After using these items, you should wash them thoroughly with soap and water.  Cover your coughs and sneezes Cover your mouth and nose with a tissue  when you cough or sneeze, or you can cough or sneeze into your sleeve. Throw used tissues in a lined trash can, and immediately wash your hands with soap and water for at least 20 seconds or use an alcohol-based hand rub.  Wash your [facility] your hands often and thoroughly with soap and water for at least 20 seconds. You can use an alcohol-based hand sanitizer if soap and water are not available and if your hands are not visibly dirty. Avoid touching your eyes, nose, and mouth with unwashed hands.   Prevention Steps for Caregivers and Household Members of Individuals Confirmed to have, or Being Evaluated for, or have symptoms of 2019 Novel Coronavirus (COVID-19) Infection Being Cared for in the Home  If you live with, or provide care at home for, a person confirmed to have, or being evaluated for, COVID-19 infection please follow these guidelines to prevent infection:  Follow healthcare provider's instructions Make sure that you understand and can help the patient follow any healthcare provider instructions for all care.  Provide for the patient's basic needs You should help the patient with basic needs in the home and provide support for getting groceries, prescriptions, and other personal needs.  Monitor the patient's symptoms If they are getting sicker, call his or her medical provider and tell them that the patient has, or is being evaluated for, COVID-19 infection. This will help the healthcare provider's office take steps to keep other people from getting infected. Ask the healthcare provider to call the local or state health department.  Limit the number of people who have contact with the patient If possible, have only one caregiver for the patient. Other household members should stay in another home or place of residence. If this is not possible, they should stay in another room, or be separated from the patient as much as possible. Use a separate bathroom, if available. Restrict  visitors who do not have an essential need to be in the home.  Keep older adults, very young children, and other sick people away from the patient Keep older adults, very young children, and those who have compromised immune systems or chronic health conditions away from the patient. This includes people with chronic heart, lung, or kidney conditions, diabetes, and cancer.  Ensure good ventilation Make sure that shared spaces in the home have good air flow, such as from an air conditioner or an opened window, weather permitting.  Wash your hands often Wash your hands often and thoroughly with soap and water for at least 20 seconds. You can use an alcohol based hand sanitizer if soap and water are not available and if your hands are not visibly dirty. Avoid touching your eyes, nose, and mouth with unwashed hands. Use disposable paper towels to dry your hands. If not available, use dedicated cloth towels and replace them when they become wet.  Wear a facemask and gloves Wear a disposable facemask at all times in the room and gloves when you touch or have contact with the patient's blood, body fluids, and/or secretions or excretions, such as sweat, saliva, sputum, nasal mucus, vomit, urine, or feces.  Ensure the mask fits over your nose and mouth tightly, and do not touch it during use. Throw out disposable facemasks and gloves after using them. Do not reuse. Wash your hands immediately after  removing your facemask and gloves. If your personal clothing becomes contaminated, carefully remove clothing and launder. Wash your hands after handling contaminated clothing. Place all used disposable facemasks, gloves, and other waste in a lined container before disposing them with other household waste. Remove gloves and wash your hands immediately after handling these items.  Do not share dishes, glasses, or other household items with the patient Avoid sharing household items. You should not share  dishes, drinking glasses, cups, eating utensils, towels, bedding, or other items with a patient who is confirmed to have, or being evaluated for, COVID-19 infection. After the person uses these items, you should wash them thoroughly with soap and water.  Wash laundry thoroughly Immediately remove and wash clothes or bedding that have blood, body fluids, and/or secretions or excretions, such as sweat, saliva, sputum, nasal mucus, vomit, urine, or feces, on them. Wear gloves when handling laundry from the patient. Read and follow directions on labels of laundry or clothing items and detergent. In general, wash and dry with the warmest temperatures recommended on the label.  Clean all areas the individual has used often Clean all touchable surfaces, such as counters, tabletops, doorknobs, bathroom fixtures, toilets, phones, keyboards, tablets, and bedside tables, every day. Also, clean any surfaces that may have blood, body fluids, and/or secretions or excretions on them. Wear gloves when cleaning surfaces the patient has come in contact with. Use a diluted bleach solution (e.g., dilute bleach with 1 part bleach and 10 parts water) or a household disinfectant with a label that says EPA-registered for coronaviruses. To make a bleach solution at home, add 1 tablespoon of bleach to 1 quart (4 cups) of water. For a larger supply, add  cup of bleach to 1 gallon (16 cups) of water. Read labels of cleaning products and follow recommendations provided on product labels. Labels contain instructions for safe and effective use of the cleaning product including precautions you should take when applying the product, such as wearing gloves or eye protection and making sure you have good ventilation during use of the product. Remove gloves and wash hands immediately after cleaning.  Monitor yourself for signs and symptoms of illness Caregivers and household members are considered close contacts, should monitor their  health, and will be asked to limit movement outside of the home to the extent possible. Follow the monitoring steps for close contacts listed on the symptom monitoring form.  ? If you have additional questions, contact your local health department or call the epidemiologist on call at [phone] (available 24/7). ? This guidance is subject to change. For the most up-to-date guidance from [facility], please refer to their website: YouBlogs.pl

## 2019-01-28 ENCOUNTER — Telehealth: Payer: Self-pay | Admitting: Unknown Physician Specialty

## 2019-01-28 NOTE — Telephone Encounter (Signed)
Called to discuss with patient about Covid symptoms and the use of bamlanivimab, a monoclonal antibody infusion for those with mild to moderate Covid symptoms and at a high risk of hospitalization.  Pt is not qualified for the infusion due to illness onset January 1

## 2019-02-13 ENCOUNTER — Emergency Department (HOSPITAL_COMMUNITY): Payer: Medicare PPO

## 2019-02-13 ENCOUNTER — Encounter (HOSPITAL_COMMUNITY): Payer: Self-pay | Admitting: Emergency Medicine

## 2019-02-13 ENCOUNTER — Observation Stay (HOSPITAL_COMMUNITY)
Admission: EM | Admit: 2019-02-13 | Discharge: 2019-02-14 | Disposition: A | Discharge status: Home / Self Care | Payer: Medicare PPO | Attending: Internal Medicine | Admitting: Internal Medicine

## 2019-02-13 DIAGNOSIS — Z7982 Long term (current) use of aspirin: Secondary | ICD-10-CM | POA: Diagnosis not present

## 2019-02-13 DIAGNOSIS — R600 Localized edema: Secondary | ICD-10-CM | POA: Diagnosis present

## 2019-02-13 DIAGNOSIS — Z7901 Long term (current) use of anticoagulants: Secondary | ICD-10-CM | POA: Diagnosis not present

## 2019-02-13 DIAGNOSIS — R42 Dizziness and giddiness: Principal | ICD-10-CM

## 2019-02-13 DIAGNOSIS — R2242 Localized swelling, mass and lump, left lower limb: Secondary | ICD-10-CM | POA: Diagnosis not present

## 2019-02-13 DIAGNOSIS — Z8673 Personal history of transient ischemic attack (TIA), and cerebral infarction without residual deficits: Secondary | ICD-10-CM | POA: Diagnosis not present

## 2019-02-13 DIAGNOSIS — R079 Chest pain, unspecified: Secondary | ICD-10-CM | POA: Diagnosis present

## 2019-02-13 DIAGNOSIS — Z79899 Other long term (current) drug therapy: Secondary | ICD-10-CM | POA: Diagnosis not present

## 2019-02-13 DIAGNOSIS — I825Z9 Chronic embolism and thrombosis of unspecified deep veins of unspecified distal lower extremity: Secondary | ICD-10-CM | POA: Diagnosis present

## 2019-02-13 DIAGNOSIS — Z8639 Personal history of other endocrine, nutritional and metabolic disease: Secondary | ICD-10-CM

## 2019-02-13 DIAGNOSIS — R0609 Other forms of dyspnea: Secondary | ICD-10-CM | POA: Diagnosis present

## 2019-02-13 DIAGNOSIS — R0602 Shortness of breath: Secondary | ICD-10-CM

## 2019-02-13 DIAGNOSIS — R0989 Other specified symptoms and signs involving the circulatory and respiratory systems: Secondary | ICD-10-CM | POA: Diagnosis present

## 2019-02-13 DIAGNOSIS — I82402 Acute embolism and thrombosis of unspecified deep veins of left lower extremity: Secondary | ICD-10-CM | POA: Insufficient documentation

## 2019-02-13 DIAGNOSIS — R0600 Dyspnea, unspecified: Secondary | ICD-10-CM | POA: Insufficient documentation

## 2019-02-13 DIAGNOSIS — M25571 Pain in right ankle and joints of right foot: Secondary | ICD-10-CM | POA: Diagnosis not present

## 2019-02-13 DIAGNOSIS — G459 Transient cerebral ischemic attack, unspecified: Secondary | ICD-10-CM | POA: Diagnosis present

## 2019-02-13 DIAGNOSIS — I10 Essential (primary) hypertension: Secondary | ICD-10-CM | POA: Diagnosis present

## 2019-02-13 LAB — CBC
HCT: 31.2 % — ABNORMAL LOW (ref 36.0–46.0)
Hemoglobin: 11 g/dL — ABNORMAL LOW (ref 12.0–15.0)
MCH: 29.7 pg (ref 26.0–34.0)
MCHC: 35.3 g/dL (ref 30.0–36.0)
MCV: 84.3 fL (ref 80.0–100.0)
Platelets: 222 10*3/uL (ref 150–400)
RBC: 3.7 MIL/uL — ABNORMAL LOW (ref 3.87–5.11)
RDW: 14.4 % (ref 11.5–15.5)
WBC: 8 10*3/uL (ref 4.0–10.5)
nRBC: 0 % (ref 0.0–0.2)

## 2019-02-13 LAB — BASIC METABOLIC PANEL
Anion gap: 10 (ref 5–15)
BUN: 17 mg/dL (ref 8–23)
CO2: 24 mmol/L (ref 22–32)
Calcium: 9.4 mg/dL (ref 8.9–10.3)
Chloride: 103 mmol/L (ref 98–111)
Creatinine, Ser: 1.14 mg/dL — ABNORMAL HIGH (ref 0.44–1.00)
GFR calc Af Amer: 49 mL/min — ABNORMAL LOW (ref >60)
GFR calc non Af Amer: 43 mL/min — ABNORMAL LOW (ref >60)
Glucose, Bld: 94 mg/dL (ref 70–99)
Potassium: 4.2 mmol/L (ref 3.5–5.1)
Sodium: 137 mmol/L (ref 135–145)

## 2019-02-13 LAB — TROPONIN I (HIGH SENSITIVITY)
Troponin I (High Sensitivity): 16 ng/L (ref <18)
Troponin I (High Sensitivity): 19 ng/L — ABNORMAL HIGH (ref <18)

## 2019-02-13 MED ORDER — SODIUM CHLORIDE 0.9% FLUSH
3.0000 mL | Freq: Once | INTRAVENOUS | Status: AC
  Administered 2019-02-13: 3 mL via INTRAVENOUS

## 2019-02-13 MED ORDER — DIPHENHYDRAMINE HCL 25 MG PO CAPS
50.0000 mg | ORAL_CAPSULE | Freq: Once | ORAL | Status: AC
  Filled 2019-02-13: qty 2

## 2019-02-13 MED ORDER — HYDROCORTISONE NA SUCCINATE PF 250 MG IJ SOLR
200.0000 mg | Freq: Once | INTRAMUSCULAR | Status: AC
  Administered 2019-02-13: 200 mg via INTRAVENOUS
  Filled 2019-02-13: qty 200

## 2019-02-13 MED ORDER — DIPHENHYDRAMINE HCL 50 MG/ML IJ SOLN
50.0000 mg | Freq: Once | INTRAMUSCULAR | Status: AC
  Administered 2019-02-13: 50 mg via INTRAVENOUS
  Filled 2019-02-13: qty 1

## 2019-02-13 NOTE — ED Notes (Signed)
Pt taken to xray in NAD

## 2019-02-13 NOTE — ED Notes (Signed)
Pt back from xray without incidence. Hydrocortisone given per MAR. Name/DOB verified with pt

## 2019-02-13 NOTE — ED Notes (Signed)
Benadryl given per MAR. Name/DOB verified with pt.

## 2019-02-13 NOTE — ED Triage Notes (Signed)
Pt tested + for covid 19 on 01/18/19. Pt states she had recovered from those sxs until 2 days ago suddenly became sob having CP and lightheadedness. Denies any fevers and chills.

## 2019-02-13 NOTE — ED Notes (Signed)
Pt taken to and from BR in NAD

## 2019-02-13 NOTE — ED Notes (Addendum)
Pt on call light reporting that she can't feel her legs. Pt reports "they feel heavy and numb." Pt reporting sensation equal to both legs. VSS on monitors. Pt appearing anxious and endorses anxiety. Dr. Christy Gentles notified and at bedside.

## 2019-02-13 NOTE — ED Notes (Addendum)
Pt wheeled to ED [facility] from [place]. Pt A&Ox4, In NAD. Placed on continuous monitors. VSS. Pt c/o lightheadedness, midsternal, nonradiating CP, and SOB that began yesterday. Pt endorses being diagnoses with Covid on January 1st. Reports Covid symptoms resolved after 14 days. Pt has Hx of DVTs on eloquis. Denies N/V/D/fevers. Denies calf pain. Endorses R ankle pain from when she fell a couple of weeks ago. Pt denies any issues with ambulation at home PIV intiiated, 20G, to RAC. IV flushes with 10 cc NS without s/s of infiltration. Positive blood return, secured with tape and tegaderm ED provider at bedside EKG and labs completed in triage

## 2019-02-13 NOTE — ED Notes (Signed)
Pt asking about wait time. Pt informed. Pt stating that chest is still hurting. Will reassess vitals.

## 2019-02-13 NOTE — ED Provider Notes (Signed)
[facility] EMERGENCY DEPARTMENT Provider Note   CSN: [id number] Arrival date & time: 02/13/19  1545     History Chief Complaint  Patient presents with  . Shortness of Breath    [name] is a 86 y.o. female.  Patient is an 86 year old female with past medical history of hypertension, hyperlipidemia, prior TIA, and recent diagnosis of COVID-19.  This was apparently diagnosed the beginning of January.  Patient was recovering until yesterday.  She began developing shortness of breath and tightness in her chest.  She called her doctor today who told her to come to the ER to be evaluated for a possible blood clot.  Patient is denying any fevers or chills.  She denies swelling in her legs.  She does report right ankle pain and swelling and states that she fell 2 weeks ago while she was sick with Covid.  The history is provided by the patient.  Shortness of Breath Severity:  Moderate Onset quality:  Sudden Duration:  24 hours Timing:  Constant Progression:  Worsening Chronicity:  New Context: activity   Relieved by:  Nothing Worsened by:  Nothing Ineffective treatments:  None tried Associated symptoms: no fever        Past Medical History:  Diagnosis Date  . Abnormal Pap smear 1975-76  . Anemia    history of  . Arthritis    spine  . Breast cyst, left 1980  . Cystocele 2012  . Diverticulosis    with h/o Diverticulitis  . GERD (gastroesophageal reflux disease)   . H/O hypercholesterolemia   . H/O osteopenia   . H/O varicella   . H/O varicose veins   . Hx of Mumps   . Macular degeneration   . Seasonal allergies   . Urge incontinence 2012  . Urinary frequency 2010    Patient Active Problem List   Diagnosis Date Noted  . DVT, lower extremity, distal, acute, left ([place]) 12/24/2018  . Palpitations 05/30/2018  . Irregular heartbeat 03/02/2018  . TIA (transient ischemic attack) 02/15/2018  . Urinary, incontinence, stress female 06/16/2016  .  Right carotid bruit 12/23/2014  . Exertional dyspnea 12/24/2013  . Bilateral arm numbness and tingling while sleeping - and shortly after waking 12/24/2013  . Poor balance 12/24/2013  . Edema of left lower extremity 12/21/2012  . Essential hypertension; borderline   . H/O hypercholesterolemia   . Osteoporosis - of the spine 09/22/2011    Past Surgical History:  Procedure Laterality Date  . ABDOMINAL HYSTERECTOMY    . BLADDER SUSPENSION N/A 06/16/2016   Procedure: TRANSVAGINAL TAPE (TVT) PROCEDURE;  Surgeon: Everett Graff, MD;  Location: [facility];  Service: Gynecology;  Laterality: N/A;  . BREAST CYST ASPIRATION  1964  . COLONOSCOPY     Dr. Earlean Shawl  . CYSTOCELE REPAIR N/A 06/16/2016   Procedure: ANTERIOR REPAIR (CYSTOCELE);  Surgeon: Everett Graff, MD;  Location: [facility];  Service: Gynecology;  Laterality: N/A;  . CYSTOSCOPY N/A 06/16/2016   Procedure: CYSTOSCOPY;  Surgeon: Everett Graff, MD;  Location: [facility];  Service: Gynecology;  Laterality: N/A;  see anterior repair  . Lower Extremity Venous Dopplers  01/09/2012   Right and left lower steroids: No evidence of thrombus or, thrombophlebitis; right and left GSV and SSV: No venous insufficiency. Normal exam.  . NM MYOVIEW LTD  11/2016   6.6 METS.  Reached 106% of max.  Heart rate.  Walk for 4:40 min.  EF 72%.  Normal blood pressure response.  upsloping ST segment  depression, nonspecific.  Otherwise normal study.  LOW RISK.  No evidence of ischemia or infarction.  . TRANSTHORACIC ECHOCARDIOGRAM  01/2018   EF > 65%. LV size small. Gr 1 DD (normal for age).   Normal atrial size. Moderate Aortic Sclerosis without Stenosis. Moderate Mitral Annular Calcification with mild MR & no MS     OB History    Gravida  3   Para  3   Term  3   Preterm      AB      Living  3     SAB      TAB      Ectopic      Multiple      Live Births  3           Family History  Problem Relation Age of Onset  . Uterine cancer Mother 45  .  Heart attack Brother 41    Social History   Tobacco Use  . Smoking status: Never Smoker  . Smokeless tobacco: Never Used  Substance Use Topics  . Alcohol use: No  . Drug use: No    Home Medications Prior to Admission medications   Medication Sig Start Date End Date Taking? Authorizing Provider  acetaminophen (TYLENOL) 500 MG tablet Take 1,000 mg by mouth every 8 (eight) hours as needed for mild pain.    [provider]  apixaban (ELIQUIS) 5 MG TABS tablet Take 1 tablet (5 mg total) by mouth 2 (two) times daily. 12/24/18   Leonie Man, MD  apixaban (ELIQUIS) 5 MG TABS tablet TAKE 10 MG ( 2 TABLETS OF 5 MG) TWICE A DAY  FOR 7 DAYS ONLY 12/24/18   Leonie Man, MD  aspirin EC 81 MG tablet Take 1 tablet (81 mg total) by mouth daily. 02/16/18 02/16/19  DanfordSuann Larry, MD  cholecalciferol (VITAMIN D) 1000 units tablet Take 1,000 Units by mouth daily.    [provider]  Lactobacillus Rhamnosus, GG, (CULTURELLE) CAPS Take 1 capsule by mouth every morning.     [provider]  Magnesium 250 MG TABS Take 250 mg by mouth daily as needed (leg cramps).    [provider]  meclizine (ANTIVERT) 25 MG tablet Take 1 tablet (25 mg total) by mouth 2 (two) times daily as needed for dizziness. 05/31/18   Julianne Rice, MD  Multiple Vitamin (MULTIVITAMIN WITH MINERALS) TABS Take 1 tablet by mouth daily.    [provider]  Multiple Vitamins-Minerals (HAIR VITAMINS) TABS Take 1 tablet by mouth 3 (three) times a week. Hair supplement called Hair Volume    [provider]  pravastatin (PRAVACHOL) 20 MG tablet pravastatin sodium  20 mg tabs    [provider]  Trospium Chloride 60 MG CP24 Take 1 capsule by mouth daily. 10/29/18   [provider]    Allergies    Ivp dye [iodinated diagnostic agents] and Myrbetriq [mirabegron]  Review of Systems   Review of Systems  Constitutional: Negative for fever.  Respiratory:  Positive for shortness of breath.   All other systems reviewed and are negative.   Physical Exam Updated Vital Signs BP 133/76 (BP Location: Right Arm)   Pulse 94   Temp 98.1 F (36.7 C) (Oral)   Resp 16   SpO2 99%   Physical Exam Vitals and nursing note reviewed.  Constitutional:      General: She is not in acute distress.    Appearance: She is well-developed. She is not  diaphoretic.  HENT:     Head: Normocephalic and atraumatic.  Cardiovascular:     Rate and Rhythm: Normal rate and regular rhythm.     Heart sounds: No murmur. No friction rub. No gallop.   Pulmonary:     Effort: Pulmonary effort is normal. No respiratory distress.     Breath sounds: Normal breath sounds. No wheezing.  Abdominal:     General: Bowel sounds are normal. There is no distension.     Palpations: Abdomen is soft.     Tenderness: There is no abdominal tenderness.  Musculoskeletal:        General: Normal range of motion.     Cervical back: Normal range of motion and neck supple.     Right lower leg: No edema.     Left lower leg: No edema.     Comments: There is some swelling of the lateral malleolus of the right ankle.  There is no obvious deformity or ecchymosis.  She does have some pain with range of motion.  DP pulses are palpable and motor and sensation are intact to the foot.  Skin:    General: Skin is warm and dry.  Neurological:     Mental Status: She is alert and oriented to person, place, and time.     ED Results / Procedures / Treatments   Labs (all labs ordered are listed, but only abnormal results are displayed) Labs Reviewed  BASIC METABOLIC PANEL - Abnormal; Notable for the following components:      Result Value   Creatinine, Ser 1.14 (*)    GFR calc non Af Amer 43 (*)    GFR calc Af Amer 49 (*)    All other components within normal limits  CBC - Abnormal; Notable for the following components:   RBC 3.70 (*)    Hemoglobin 11.0 (*)    HCT 31.2 (*)    All other components  within normal limits  TROPONIN I (HIGH SENSITIVITY)  TROPONIN I (HIGH SENSITIVITY)    EKG EKG Interpretation  Date/Time:  Wednesday February 13 2019 15:51:02 EST Ventricular Rate:  89 PR Interval:  132 QRS Duration: 106 QT Interval:  356 QTC Calculation: 433 R Axis:   -8 Text Interpretation: Normal sinus rhythm Incomplete right bundle branch block Nonspecific ST abnormality Abnormal ECG Confirmed by Veryl Speak [phone]) on 02/13/2019 7:28:42 PM   Radiology DG Chest 2 View  Result Date: 02/13/2019 CLINICAL DATA:  Shortness of breath and chest pain. EXAM: CHEST - 2 VIEW COMPARISON:  January 18, 2019 FINDINGS: The lungs are hyperinflated. Mild chronic appearing increased interstitial lung markings are seen. Is stable in appearance when compared to the prior study. There is no evidence of acute infiltrate, pleural effusion or pneumothorax. The heart size and mediastinal contours are within normal limits. Multilevel degenerative changes seen throughout the thoracic spine. IMPRESSION: 1. Chronic appearing increased interstitial lung markings without evidence of acute or active cardiopulmonary disease. Electronically Signed   By: Virgina Norfolk M.D.   On: 02/13/2019 17:02    Procedures Procedures (including critical care time)  Medications Ordered in ED Medications  sodium chloride flush (NS) 0.9 % injection 3 mL (has no administration in time range)  hydrocortisone sodium succinate (SOLU-CORTEF) injection 200 mg (has no administration in time range)  diphenhydrAMINE (BENADRYL) capsule 50 mg (has no administration in time range)    Or  diphenhydrAMINE (BENADRYL) injection 50 mg (has no administration in time range)    ED Course  I have  reviewed the triage vital signs and the nursing notes.  Pertinent labs & imaging results that were available during my care of the patient were reviewed by me and considered in my medical decision making (see chart for details).    MDM  Rules/Calculators/A&P  Patient is an 86 year old female with recent diagnosis of COVID-19.  She apparently got over this and was doing well until yesterday.  She is now having increased shortness of breath and chest tightness.  She also reports swelling to her right leg, but did injure her ankle 2 weeks ago.  Laboratory studies thus far are unremarkable with the exception of a mildly elevated troponin of 16, then 19.  Chest x-ray shows chronic appearing interstitial lung markings with no acute or active disease.  Her EKG is unchanged from prior studies.  Due to the nature of the patient's symptoms, I feel as though a pulmonary embolism needs to be excluded.  CT angio of the chest was ordered, but will be delayed secondary to contrast allergy.  Patient will be premedicated, that have this exam performed.  Care to be signed out to oncoming provider at shift change.  Dr. Christy Gentles will obtain the results of CT scan and determine the final disposition.  Final Clinical Impression(s) / ED Diagnoses Final diagnoses:  None    Rx / DC Orders ED Discharge Orders    None       Veryl Speak, MD 02/13/19 2313

## 2019-02-13 NOTE — ED Provider Notes (Signed)
Plan to f/u with CT imaging and reassess   Catherine Fraise, MD 02/13/19 2321

## 2019-02-14 ENCOUNTER — Emergency Department (HOSPITAL_COMMUNITY): Payer: Medicare PPO

## 2019-02-14 ENCOUNTER — Observation Stay (HOSPITAL_BASED_OUTPATIENT_CLINIC_OR_DEPARTMENT_OTHER): Payer: Medicare PPO

## 2019-02-14 ENCOUNTER — Encounter (HOSPITAL_COMMUNITY): Payer: Self-pay | Admitting: Radiology

## 2019-02-14 DIAGNOSIS — R42 Dizziness and giddiness: Secondary | ICD-10-CM | POA: Diagnosis not present

## 2019-02-14 DIAGNOSIS — R079 Chest pain, unspecified: Secondary | ICD-10-CM | POA: Diagnosis present

## 2019-02-14 DIAGNOSIS — I350 Nonrheumatic aortic (valve) stenosis: Secondary | ICD-10-CM | POA: Diagnosis not present

## 2019-02-14 DIAGNOSIS — I340 Nonrheumatic mitral (valve) insufficiency: Secondary | ICD-10-CM | POA: Diagnosis not present

## 2019-02-14 LAB — CBC WITH DIFFERENTIAL/PLATELET
Abs Immature Granulocytes: 0.01 10*3/uL (ref 0.00–0.07)
Basophils Absolute: 0 10*3/uL (ref 0.0–0.1)
Basophils Relative: 0 %
Eosinophils Absolute: 0 10*3/uL (ref 0.0–0.5)
Eosinophils Relative: 0 %
HCT: 30.2 % — ABNORMAL LOW (ref 36.0–46.0)
Hemoglobin: 10.7 g/dL — ABNORMAL LOW (ref 12.0–15.0)
Immature Granulocytes: 0 %
Lymphocytes Relative: 22 %
Lymphs Abs: 1 10*3/uL (ref 0.7–4.0)
MCH: 29.7 pg (ref 26.0–34.0)
MCHC: 35.4 g/dL (ref 30.0–36.0)
MCV: 83.9 fL (ref 80.0–100.0)
Monocytes Absolute: 0.1 10*3/uL (ref 0.1–1.0)
Monocytes Relative: 2 %
Neutro Abs: 3.4 10*3/uL (ref 1.7–7.7)
Neutrophils Relative %: 76 %
Platelets: 191 10*3/uL (ref 150–400)
RBC: 3.6 MIL/uL — ABNORMAL LOW (ref 3.87–5.11)
RDW: 14.3 % (ref 11.5–15.5)
WBC: 4.5 10*3/uL (ref 4.0–10.5)
nRBC: 0 % (ref 0.0–0.2)

## 2019-02-14 LAB — POCT I-STAT 7, (LYTES, BLD GAS, ICA,H+H)
Acid-base deficit: 2 mmol/L (ref 0.0–2.0)
Bicarbonate: 20.8 mmol/L (ref 20.0–28.0)
Calcium, Ion: 1.25 mmol/L (ref 1.15–1.40)
HCT: 31 % — ABNORMAL LOW (ref 36.0–46.0)
Hemoglobin: 10.5 g/dL — ABNORMAL LOW (ref 12.0–15.0)
O2 Saturation: 98 %
Patient temperature: 98.6
Potassium: 4.3 mmol/L (ref 3.5–5.1)
Sodium: 138 mmol/L (ref 135–145)
TCO2: 22 mmol/L (ref 22–32)
pCO2 arterial: 29.9 mmHg — ABNORMAL LOW (ref 32.0–48.0)
pH, Arterial: 7.45 (ref 7.350–7.450)
pO2, Arterial: 99 mmHg (ref 83.0–108.0)

## 2019-02-14 LAB — TROPONIN I (HIGH SENSITIVITY)
Troponin I (High Sensitivity): 15 ng/L (ref <18)
Troponin I (High Sensitivity): 14 ng/L (ref <18)

## 2019-02-14 LAB — ECHOCARDIOGRAM COMPLETE
Height: 63 in
Weight: 1992.96 oz

## 2019-02-14 LAB — PROCALCITONIN: Procalcitonin: <0.1 ng/mL

## 2019-02-14 MED ORDER — DIAZEPAM 2 MG PO TABS: 2.0000 mg | ORAL_TABLET | Freq: Two times a day (BID) | ORAL | Status: DC | PRN

## 2019-02-14 MED ORDER — HYDRALAZINE HCL 20 MG/ML IJ SOLN: 5.0000 mg | Freq: Four times a day (QID) | INTRAMUSCULAR | Status: DC | PRN

## 2019-02-14 MED ORDER — MAGNESIUM OXIDE 400 (241.3 MG) MG PO TABS: 400.0000 mg | ORAL_TABLET | Freq: Every day | ORAL | Status: DC | PRN

## 2019-02-14 MED ORDER — ONDANSETRON HCL 4 MG/2ML IJ SOLN: 4.0000 mg | Freq: Four times a day (QID) | INTRAMUSCULAR | Status: DC | PRN

## 2019-02-14 MED ORDER — IOHEXOL 350 MG/ML SOLN
75.0000 mL | Freq: Once | INTRAVENOUS | Status: AC | PRN
  Administered 2019-02-14: 75 mL via INTRAVENOUS

## 2019-02-14 MED ORDER — PRAVASTATIN SODIUM 10 MG PO TABS
20.0000 mg | ORAL_TABLET | Freq: Every day | ORAL | Status: DC
  Administered 2019-02-14: 20 mg via ORAL
  Filled 2019-02-14: qty 2

## 2019-02-14 MED ORDER — ASCORBIC ACID 500 MG PO TABS
500.0000 mg | ORAL_TABLET | Freq: Every day | ORAL | Status: DC
  Administered 2019-02-14: 1000 mg via ORAL
  Filled 2019-02-14: qty 2

## 2019-02-14 MED ORDER — LISINOPRIL 5 MG PO TABS
5.0000 mg | ORAL_TABLET | Freq: Every day | ORAL | Status: DC
  Administered 2019-02-14: 5 mg via ORAL
  Filled 2019-02-14: qty 1

## 2019-02-14 MED ORDER — ADULT MULTIVITAMIN W/MINERALS CH
1.0000 | ORAL_TABLET | Freq: Every day | ORAL | Status: DC
  Administered 2019-02-14: 1 via ORAL
  Filled 2019-02-14: qty 1

## 2019-02-14 MED ORDER — SODIUM CHLORIDE 0.9 % IV SOLN: INTRAVENOUS | Status: DC

## 2019-02-14 MED ORDER — VITAMIN D 25 MCG (1000 UNIT) PO TABS
1000.0000 [IU] | ORAL_TABLET | Freq: Every day | ORAL | Status: DC
  Administered 2019-02-14: 1000 [IU] via ORAL
  Filled 2019-02-14: qty 1

## 2019-02-14 MED ORDER — METOPROLOL SUCCINATE ER 50 MG PO TB24
50.0000 mg | ORAL_TABLET | Freq: Every day | ORAL | Status: DC
  Administered 2019-02-14: 50 mg via ORAL
  Filled 2019-02-14: qty 2

## 2019-02-14 MED ORDER — AMOXICILLIN-POT CLAVULANATE 875-125 MG PO TABS: 1.0000 | ORAL_TABLET | Freq: Two times a day (BID) | ORAL | 0 refills | Status: AC

## 2019-02-14 MED ORDER — LEVOFLOXACIN IN D5W 750 MG/150ML IV SOLN: 500.0000 mg | INTRAVENOUS | Status: DC

## 2019-02-14 MED ORDER — APIXABAN 5 MG PO TABS: 5.0000 mg | ORAL_TABLET | Freq: Two times a day (BID) | ORAL | Status: DC

## 2019-02-14 MED ORDER — RISAQUAD PO CAPS
1.0000 | ORAL_CAPSULE | Freq: Every morning | ORAL | Status: DC
  Administered 2019-02-14: 1 via ORAL
  Filled 2019-02-14 (×2): qty 1

## 2019-02-14 MED ORDER — SODIUM CHLORIDE 0.9% FLUSH: 3.0000 mL | Freq: Two times a day (BID) | INTRAVENOUS | Status: DC

## 2019-02-14 MED ORDER — MECLIZINE HCL 25 MG PO TABS: 25.0000 mg | ORAL_TABLET | Freq: Three times a day (TID) | ORAL | 0 refills | Status: DC

## 2019-02-14 MED ORDER — MECLIZINE HCL 25 MG PO TABS
25.0000 mg | ORAL_TABLET | Freq: Three times a day (TID) | ORAL | Status: DC
  Administered 2019-02-14 (×3): 25 mg via ORAL
  Filled 2019-02-14 (×3): qty 1

## 2019-02-14 MED ORDER — DARIFENACIN HYDROBROMIDE ER 7.5 MG PO TB24
7.5000 mg | ORAL_TABLET | Freq: Every day | ORAL | Status: DC
  Administered 2019-02-14: 7.5 mg via ORAL
  Filled 2019-02-14: qty 1

## 2019-02-14 MED ORDER — ALBUTEROL SULFATE HFA 108 (90 BASE) MCG/ACT IN AERS
2.0000 | INHALATION_SPRAY | RESPIRATORY_TRACT | Status: DC | PRN
  Filled 2019-02-14: qty 6.7

## 2019-02-14 MED ORDER — APIXABAN 2.5 MG PO TABS
2.5000 mg | ORAL_TABLET | Freq: Two times a day (BID) | ORAL | Status: DC
  Administered 2019-02-14: 10:00:00 2.5 mg via ORAL
  Filled 2019-02-14: qty 1

## 2019-02-14 MED ORDER — ALBUTEROL SULFATE (2.5 MG/3ML) 0.083% IN NEBU: 2.5000 mg | INHALATION_SOLUTION | RESPIRATORY_TRACT | Status: DC | PRN

## 2019-02-14 MED ORDER — ACETAMINOPHEN 500 MG PO TABS: 500.0000 mg | ORAL_TABLET | Freq: Three times a day (TID) | ORAL | Status: DC | PRN

## 2019-02-14 MED ORDER — LEVOFLOXACIN IN D5W 750 MG/150ML IV SOLN
750.0000 mg | INTRAVENOUS | Status: DC
  Administered 2019-02-14: 750 mg via INTRAVENOUS
  Filled 2019-02-14: qty 150

## 2019-02-14 MED ORDER — ZINC SULFATE 220 (50 ZN) MG PO CAPS
220.0000 mg | ORAL_CAPSULE | Freq: Every day | ORAL | Status: DC
  Administered 2019-02-14: 220 mg via ORAL
  Filled 2019-02-14 (×2): qty 1

## 2019-02-14 MED ORDER — ONDANSETRON HCL 4 MG PO TABS: 4.0000 mg | ORAL_TABLET | Freq: Four times a day (QID) | ORAL | Status: DC | PRN

## 2019-02-14 MED ORDER — SODIUM CHLORIDE 0.9 % IV SOLN: 250.0000 mL | INTRAVENOUS | Status: DC | PRN

## 2019-02-14 MED ORDER — SODIUM CHLORIDE 0.9% FLUSH: 3.0000 mL | INTRAVENOUS | Status: DC | PRN

## 2019-02-14 MED ORDER — SACCHAROMYCES BOULARDII 250 MG PO CAPS: 250.0000 mg | ORAL_CAPSULE | Freq: Two times a day (BID) | ORAL | Status: DC

## 2019-02-14 NOTE — Progress Notes (Signed)
  Echocardiogram 2D Echocardiogram has been performed.  Matilde Bash 02/14/2019, 2:40 PM

## 2019-02-14 NOTE — Evaluation (Signed)
Occupational Therapy Evaluation Patient Details Name: [name] MRN: [id number] DOB: 03/05/1927 Today's Date: 02/14/2019    History of Present Illness Pt is an 86 yo female admitted with SOB and dizziness.  Pt diagnosed with COVID 19 on Jan 1st.  MRI/MRA -.  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  Pt states she has had issues with dizznes for sometime now.     Clinical Impression   Pt admitted with the above diagnosis and has the deficits outlined below. Pt would benefit from cont OT to increase balance and safety when up on her feet during adls. Pt has had a h/o dizziness when walking. Pt lives with husband who can assist 24/7.  Will continue to focus on balance during adls. Should be noted that HR was up to 126 when walking in hallway. O2 sats were above 90% on RA. Will follow acutely with focus on balance.     Follow Up Recommendations  No OT follow up;Supervision/Assistance - 24 hour    Equipment Recommendations  3 in 1 bedside commode    Recommendations for Other Services       Precautions / Restrictions Precautions Precautions: Fall Precaution Comments: Pt fell back in May and again during Jan 2021 when she had COVID.  She hurt her ankle during this fall.  Xray last night negative for fx. Restrictions Weight Bearing Restrictions: No      Mobility Bed Mobility Overal bed mobility: Modified Independent             General bed mobility comments: used rails.  Transfers Overall transfer level: Needs assistance Equipment used: 1 person hand held assist Transfers: Sit to/from [facility] Sit to Stand: Supervision Stand pivot transfers: Supervision       General transfer comment: Pt has had some dizziness for sometime now.  Pt did not have LOB during any transfers but feel she may benefit from a cane or walker. Pt states she has a walker at home but does not like to use it bc she has so much carpeting in her home.     Balance Overall balance  assessment: Mild deficits observed, not formally tested                                         ADL either performed or assessed with clinical judgement   ADL Overall ADL's : Needs assistance/impaired Eating/Feeding: Independent;Sitting   Grooming: Oral care;Wash/dry face;Wash/dry hands;Supervision/safety;Standing Grooming Details (indicate cue type and reason): Pt stood at sink for 4 minutes with supervision. Upper Body Bathing: Set up;Sitting   Lower Body Bathing: Min guard;Sit to/from stand;Cueing for compensatory techniques   Upper Body Dressing : Set up;Sitting   Lower Body Dressing: Min guard;Sit to/from stand   Toilet Transfer: Min guard;Ambulation Toilet Transfer Details (indicate cue type and reason): Pt did not use assistive device but may benefit from one.  Was reaching for furniture while walking.  Toileting- Water quality scientist and Hygiene: Min guard;Sit to/from stand       Functional mobility during ADLs: Min guard General ADL Comments: Pt overall does well with adls. Biggest concern is in the area of balance. Pt reaches for furniture while walking.  Did not have an overt loss of balance but is a fall risk.     Vision Baseline Vision/History: Wears glasses Wears Glasses: Reading only Patient Visual Report: No change from baseline Vision Assessment?: No apparent  visual deficits     Perception Perception Perception Tested?: No   Praxis Praxis Praxis tested?: Within functional limits    Pertinent Vitals/Pain Pain Assessment: 0-10 Pain Score: 3  Pain Location: R ankle. during covid she fell.  Remote nurse looked at iit. Xrays never done. Pt has walked on it since. Pain Descriptors / Indicators: Sore Pain Intervention(s): Monitored during session     Hand Dominance Right   Extremity/Trunk Assessment Upper Extremity Assessment Upper Extremity Assessment: Overall WFL for tasks assessed   Lower Extremity Assessment Lower Extremity  Assessment: Defer to PT evaluation   Cervical / Trunk Assessment Cervical / Trunk Assessment: Normal   Communication Communication Communication: No difficulties   Cognition Arousal/Alertness: Awake/alert Behavior During Therapy: WFL for tasks assessed/performed Overall Cognitive Status: Within Functional Limits for tasks assessed                                     General Comments  Pt motivated.  HR up to 126 at one point when up walking in room.  O2 sats above 90% through session.    Exercises     Shoulder Instructions      Home Living Family/patient expects to be discharged to:: Private residence Living Arrangements: Spouse/significant other Available Help at Discharge: Family;Available 24 hours/day Type of Home: House Home Access: Level entry     Home Layout: One level     Bathroom Shower/Tub: Tub/shower unit;Curtain   Biochemist, clinical: Standard     Home Equipment: Grab bars - tub/shower;Walker - 2 wheels          Prior Functioning/Environment Level of Independence: Independent        Comments: Pt independent driving        OT Problem List: Impaired balance (sitting and/or standing);Decreased knowledge of use of DME or AE      OT Treatment/Interventions: Self-care/ADL training;Balance training    OT Goals(Current goals can be found in the care plan section) Acute Rehab OT Goals Patient Stated Goal: to go home today OT Goal Formulation: With patient Time For Goal Achievement: 02/28/19 Potential to Achieve Goals: Good ADL Goals Additional ADL Goal #1: Pt will walk to bathroom and toilet Ily. Additional ADL Goal #2: Pt will gather all clothes from closet and fully dress self Ily  OT Frequency: Min 2X/week   Barriers to D/C:            Co-evaluation              AM-PAC OT "6 Clicks" Daily Activity     Outcome Measure Help from another person eating meals?: None Help from another person taking care of personal grooming?:  None Help from another person toileting, which includes using toliet, bedpan, or urinal?: A Little Help from another person bathing (including washing, rinsing, drying)?: A Little Help from another person to put on and taking off regular upper body clothing?: None Help from another person to put on and taking off regular lower body clothing?: A Little 6 Click Score: 21   End of Session Nurse Communication: Mobility status  Activity Tolerance: Patient tolerated treatment well Patient left: in bed;with bed alarm set;with call bell/phone within reach  OT Visit Diagnosis: Unsteadiness on feet (R26.81)                Time: [id number] OT Time Calculation (min): 24 min Charges:  OT General Charges $OT Visit: 1 Visit OT Evaluation $  OT Eval Low Complexity: 1 Low  Glenford Peers 02/14/2019, 12:23 PM

## 2019-02-14 NOTE — Plan of Care (Signed)
Pt beig d/c to home with self care, education complete, leaving facility via private vehicle

## 2019-02-14 NOTE — ED Provider Notes (Signed)
CT imaging negative for pulmonary embolism.  CT chest did reveal groundglass opacities that could be related to recent COVID-19 infection.  However it's been greater than 21 days since her positive test.  Patient reports continued chest pain and feeling short of breath.  She is not hypoxic on room air.  She was able to ambulate, but still reports nonspecific dizziness. Due to her age, medical history, multitude of symptoms she will be admitted for observation    Ripley Fraise, MD 02/14/19 0222

## 2019-02-14 NOTE — ED Notes (Signed)
Breakfast ordered 

## 2019-02-14 NOTE — H&P (Addendum)
History and Physical    [name] [id number] DOB: [birth year] DOA: 02/13/2019  PCP: Lavone Orn, MD    Patient coming from: home    Chief Complaint: Chest pain, shortness of breath, dizziness  HPI: [name] is a 86 y.o. female with medical history significant of hypertension, hyperlipidemia, TIA, hypercholesterolemia, osteopenia, DVT, COVID-19 came with a chief complaint of shortness of breath, tachypnea, chest pain, chest tightness and dizziness.  She was diagnosed with COVID-19 on first of January..  She was doing okay at home until yesterday when she developed shortness of breath, chest tightness and dizziness, spinning sensation. She denies severe fevers chills productive cough.  She denies swelling in her legs.  She does report right ankle pain and swelling and states that she fell 2 weeks ago while she was sick with Covid.  ED Course:  In the emergency room She was anxious tachypneic not hypoxic on room air Some chest tightness Complaining of dizziness spinning denies any vomiting Was able to walk to the bathroom with some help then she got dizzy lightheaded History of gait problem after the TIA Electrocardiogram right bundle branch block normal sinus Troponin negative  CT angio chest  No evidence of pulmonary embolism. 2. Mild atelectatic changes in the lung bases. More patchy ground-glass opacity in the right lower lobe may reflect an inflammatory or infectious process. Given some adjacent airways thickening and secretions sequela of aspiration is certainly possible. 3. Coronary artery calcifications.  Review of Systems: As per HPI otherwise 10 point review of systems negative.  Except shortness of breath, dizziness, chest pain  Past Medical History:  Diagnosis Date  . Abnormal Pap smear 1975-76  . Anemia    history of  . Arthritis    spine  . Breast cyst, left 1980  . Cystocele 2012  . Diverticulosis    with h/o Diverticulitis  . GERD  (gastroesophageal reflux disease)   . H/O hypercholesterolemia   . H/O osteopenia   . H/O varicella   . H/O varicose veins   . Hx of Mumps   . Macular degeneration   . Seasonal allergies   . Urge incontinence 2012  . Urinary frequency 2010    Past Surgical History:  Procedure Laterality Date  . ABDOMINAL HYSTERECTOMY    . BLADDER SUSPENSION N/A 06/16/2016   Procedure: TRANSVAGINAL TAPE (TVT) PROCEDURE;  Surgeon: Everett Graff, MD;  Location: [facility];  Service: Gynecology;  Laterality: N/A;  . BREAST CYST ASPIRATION  1964  . COLONOSCOPY     Dr. Earlean Shawl  . CYSTOCELE REPAIR N/A 06/16/2016   Procedure: ANTERIOR REPAIR (CYSTOCELE);  Surgeon: Everett Graff, MD;  Location: [facility];  Service: Gynecology;  Laterality: N/A;  . CYSTOSCOPY N/A 06/16/2016   Procedure: CYSTOSCOPY;  Surgeon: Everett Graff, MD;  Location: [facility];  Service: Gynecology;  Laterality: N/A;  see anterior repair  . Lower Extremity Venous Dopplers  01/09/2012   Right and left lower steroids: No evidence of thrombus or, thrombophlebitis; right and left GSV and SSV: No venous insufficiency. Normal exam.  . NM MYOVIEW LTD  11/2016   6.6 METS.  Reached 106% of max.  Heart rate.  Walk for 4:40 min.  EF 72%.  Normal blood pressure response.  upsloping ST segment depression, nonspecific.  Otherwise normal study.  LOW RISK.  No evidence of ischemia or infarction.  . TRANSTHORACIC ECHOCARDIOGRAM  01/2018   EF > 65%. LV size small. Gr 1 DD (normal for age).   Normal atrial size.  Moderate Aortic Sclerosis without Stenosis. Moderate Mitral Annular Calcification with mild MR & no MS     reports that she has never smoked. She has never used smokeless tobacco. She reports that she does not drink alcohol or use drugs.  Allergies  Allergen Reactions  . Ivp Dye [Iodinated Diagnostic Agents] Hives  . Myrbetriq [Mirabegron] Other (See Comments)    "Makes me urinate more frequently"    Family History  Problem Relation Age of Onset  .  Uterine cancer Mother 86  . Heart attack Brother 78     Prior to Admission medications   Medication Sig Start Date End Date Taking? Authorizing Provider  acetaminophen (TYLENOL) 500 MG tablet Take 500-1,000 mg by mouth every 8 (eight) hours as needed for mild pain.    Yes [provider]  albuterol (VENTOLIN HFA) 108 (90 Base) MCG/ACT inhaler Inhale 2 puffs into the lungs every 4 (four) hours as needed for wheezing or shortness of breath.  01/30/19  Yes [provider]  apixaban (ELIQUIS) 5 MG TABS tablet Take 1 tablet (5 mg total) by mouth 2 (two) times daily. Patient taking differently: Take 5 mg by mouth daily.  12/24/18  Yes Leonie Man, MD  ascorbic acid (VITAMIN C) 500 MG tablet Take 500-1,000 mg by mouth daily with breakfast.   Yes [provider]  Black Elderberry (SAMBUCUS ELDERBERRY PO) Take 1 tablet by mouth daily after breakfast.   Yes [provider]  Cholecalciferol (VITAMIN D-3) 25 MCG (1000 UT) CAPS Take 1,000 Units by mouth daily with breakfast.    Yes [provider]  Lactobacillus Rhamnosus, GG, (CULTURELLE) CAPS Take 1 capsule by mouth every morning.    Yes [provider]  Magnesium 250 MG TABS Take 250 mg by mouth daily as needed (leg cramps).   Yes [provider]  meclizine (ANTIVERT) 25 MG tablet Take 1 tablet (25 mg total) by mouth 2 (two) times daily as needed for dizziness. 05/31/18  Yes Julianne Rice, MD  metoprolol succinate (TOPROL-XL) 50 MG 24 hr tablet Take 50 mg by mouth daily. 02/04/19  Yes [provider]  Multiple Vitamins-Minerals (AIRBORNE PO) Take 1 tablet by mouth daily with breakfast.   Yes [provider]  Multiple Vitamins-Minerals (CENTRUM SILVER ULTRA WOMENS) TABS Take 1 tablet by mouth daily with breakfast.    Yes [provider]  pravastatin (PRAVACHOL) 20 MG tablet Take 20 mg by mouth daily.    Yes [provider]  Trospium Chloride 60 MG CP24  Take 60 mg by mouth daily.  10/29/18  Yes [provider]  zinc gluconate 50 MG tablet Take 50-100 mg by mouth daily with breakfast.   Yes [provider]  apixaban (ELIQUIS) 5 MG TABS tablet TAKE 10 MG ( 2 TABLETS OF 5 MG) TWICE A DAY  FOR 7 DAYS ONLY Patient not taking: Reported on 02/13/2019 12/24/18   Leonie Man, MD  aspirin EC 81 MG tablet Take 1 tablet (81 mg total) by mouth daily. 02/16/18 02/16/19  Danford, Suann Larry, MD  lisinopril (ZESTRIL) 10 MG tablet Take 10 mg by mouth daily. 02/07/19   [provider]    Physical Exam: Vitals:   02/14/19 0004 02/14/19 0100 02/14/19 0115 02/14/19 0238  BP: (!) 172/98   132/79  Pulse: 94 82 85 92  Resp: (!) 30 18 17  (!) 21  Temp:      TempSrc:      SpO2: 99% 97% 95% 98%  Constitutional: NAD, calm, comfortable Vitals:   02/14/19 0004 02/14/19 0100 02/14/19 0115 02/14/19 0238  BP: (!) 172/98   132/79  Pulse: 94 82 85 92  Resp: (!) 30 18 17  (!) 21  Temp:      TempSrc:      SpO2: 99% 97% 95% 98%   Eyes: PERRL, lids and conjunctivae normal ENMT: Mucous membranes are moist. Posterior pharynx clear of any exudate or lesions.Normal dentition.  Neck: normal, supple, no masses, no thyromegaly Respiratory: clear to auscultation bilaterally, no wheezing, no crackles.  Tachypneic. No accessory muscle use.  Cardiovascular: Regular rate and rhythm, no murmurs / rubs / gallops.+ Right lateral malleolus of the right ankle extremity edema.  2+ pedal pulses. carotid bruits.  Abdomen: no tenderness, no masses palpated. No hepatosplenomegaly. Bowel sounds positive.  Musculoskeletal: no clubbing / cyanosis. No joint deformity upper and lower extremities. Good ROM, no contractures. Normal muscle tone.  Skin: no rashes, lesions, ulcers. No induration Neurologic: CN 2-12 grossly intact. Sensation intact, DTR normal. Strength 5/5 in all 4.  Psychiatric: She has   Labs on Admission: I have personally reviewed following  labs and imaging studies  CBC: Recent Labs  Lab 02/13/19 1627  WBC 8.0  HGB 11.0*  HCT 31.2*  MCV 84.3  PLT [id number]   Basic Metabolic Panel: Recent Labs  Lab 02/13/19 1627  NA 137  K 4.2  CL 103  CO2 24  GLUCOSE 94  BUN 17  CREATININE 1.14*  CALCIUM 9.4   GFR: CrCl cannot be calculated (Unknown ideal weight.). Liver Function Tests: No results for input(s): AST, ALT, ALKPHOS, BILITOT, PROT, ALBUMIN in the last 168 hours. No results for input(s): LIPASE, AMYLASE in the last 168 hours. No results for input(s): AMMONIA in the last 168 hours. Coagulation Profile: No results for input(s): INR, PROTIME in the last 168 hours. Cardiac Enzymes: No results for input(s): CKTOTAL, CKMB, CKMBINDEX, TROPONINI in the last 168 hours. BNP (last 3 results) No results for input(s): PROBNP in the last 8760 hours. HbA1C: No results for input(s): HGBA1C in the last 72 hours. CBG: No results for input(s): GLUCAP in the last 168 hours. Lipid Profile: No results for input(s): CHOL, HDL, LDLCALC, TRIG, CHOLHDL, LDLDIRECT in the last 72 hours. Thyroid Function Tests: No results for input(s): TSH, T4TOTAL, FREET4, T3FREE, THYROIDAB in the last 72 hours. Anemia Panel: No results for input(s): VITAMINB12, FOLATE, FERRITIN, TIBC, IRON, RETICCTPCT in the last 72 hours. Urine analysis:    Component Value Date/Time   COLORURINE STRAW (A) 05/31/2018 1811   APPEARANCEUR CLEAR 05/31/2018 1811   LABSPEC 1.005 05/31/2018 1811   PHURINE 6.0 05/31/2018 1811   GLUCOSEU NEGATIVE 05/31/2018 1811   HGBUR NEGATIVE 05/31/2018 1811   BILIRUBINUR NEGATIVE 05/31/2018 1811   BILIRUBINUR neg 08/24/2011 1746   [place] 05/31/2018 1811   PROTEINUR NEGATIVE 05/31/2018 1811   UROBILINOGEN negative 08/24/2011 1746   UROBILINOGEN 0.2 09/30/2010 1626   NITRITE NEGATIVE 05/31/2018 1811   LEUKOCYTESUR NEGATIVE 05/31/2018 1811    Radiological Exams on Admission: DG Chest 2 View  Result Date:  02/13/2019 CLINICAL DATA:  Shortness of breath and chest pain. EXAM: CHEST - 2 VIEW COMPARISON:  January 18, 2019 FINDINGS: The lungs are hyperinflated. Mild chronic appearing increased interstitial lung markings are seen. Is stable in appearance when compared to the prior study. There is no evidence of acute infiltrate, pleural effusion or pneumothorax. The heart size and mediastinal contours are within normal limits. Multilevel degenerative changes seen throughout the  thoracic spine. IMPRESSION: 1. Chronic appearing increased interstitial lung markings without evidence of acute or active cardiopulmonary disease. Electronically Signed   By: Virgina Norfolk M.D.   On: 02/13/2019 17:02   DG Ankle Complete Right  Result Date: 02/13/2019 CLINICAL DATA:  Fall with pain and swelling. EXAM: RIGHT ANKLE - COMPLETE 3+ VIEW COMPARISON:  None. FINDINGS: Medial and lateral soft tissue swelling but no evidence of fracture or dislocation. IMPRESSION: Soft tissue swelling but no sign of fracture or dislocation. Electronically Signed   By: Nelson Chimes M.D.   On: 02/13/2019 20:44   CT Angio Chest PE W and/or Wo Contrast  Result Date: 02/14/2019 CLINICAL DATA:  Shortness of breath and palpitations for 2 days. EXAM: CT ANGIOGRAPHY CHEST WITH CONTRAST TECHNIQUE: Multidetector CT imaging of the chest was performed using the standard protocol during bolus administration of intravenous contrast. Multiplanar CT image reconstructions and MIPs were obtained to evaluate the vascular anatomy. CONTRAST:  22mL OMNIPAQUE IOHEXOL 350 MG/ML SOLN COMPARISON:  Chest radiograph 02/13/2019, CT 04/21/2010 FINDINGS: Cardiovascular: Satisfactory opacification the pulmonary arteries to the segmental level. No pulmonary artery filling defects are identified. Central pulmonary arteries are normal caliber. Suboptimal opacification of the thoracic aorta. No gross luminal abnormality is seen. No periaortic stranding or hemorrhage. Normal caliber  thoracic aorta. Minimal atherosclerotic plaque. Normal branching of the great vessels. Proximal great vessels are free of acute abnormality. Normal cardiac size. No pericardial effusion. Some coarse calcification is seen upon the mitral valve annulus and aortic valve leaflets. Coronary artery calcifications are present. Mediastinum/Nodes: No pathologically enlarged mediastinal, hilar or axillary adenopathy. The thyroid gland, trachea, and esophagus demonstrate no significant findings. Lungs/Pleura: There are some atelectatic changes in the lung bases. More patchy ground-glass is seen in the right lower lobe with airways thickening and a few mucus impacted airways. No convincing features of edema. No pneumothorax. No visible effusion. No worrisome nodules or masses. Upper Abdomen: No acute abnormalities present in the visualized portions of the upper abdomen. Musculoskeletal: Multilevel degenerative changes are present in the imaged portions of the spine. Additional degenerative changes in the shoulders. No acute osseous abnormality or suspicious osseous lesion. No worrisome chest wall lesions. Review of the MIP images confirms the above findings. IMPRESSION: 1. No evidence of pulmonary embolism. 2. Mild atelectatic changes in the lung bases. More patchy ground-glass opacity in the right lower lobe may reflect an inflammatory or infectious process. Given some adjacent airways thickening and secretions sequela of aspiration is certainly possible. 3. Coronary artery calcifications. 4.  Aortic Atherosclerosis (ICD10-I70.0). Electronically Signed   By: Lovena Le M.D.   On: 02/14/2019 00:54    EKG: Independently reviewed normal sinus right bundle branch block  Assessment and plan  Chest pain Chest tightness Electrocardiogram no acute ST-T changes right bundle Serial troponin negative Plan echocardiogram, serial troponin, if positive cardiology consult  Shortness of breath Rule out residual Covid pneumonia  with possible bacterial superinfection Patient is tachypneic not hypoxic on room air No fever no chills no white count no productive cough Complaining of shortness of breath possible more secondary to anxiety CT scan angio  1. No evidence of pulmonary embolism. 2. Mild atelectatic changes in the lung bases. More patchy ground-glass opacity in the right lower lobe may reflect an inflammatory or infectious process. Given some adjacent airways thickening and secretions sequela of aspiration is certainly Possible. Plan CRP, procalcitonin, ABG, 6 minutes walking test, empirically Levaquin, start on prednisone 40 mg daily/received iv steroids in ed for contrast  study Patient does not need to be tested for Covid has more than 21 days since positive Covid test  Dizziness Looks like vertigo, spinning No focal neurologic deficits History of TIA Rule out infarct posterior circulation Had an MRI with MRA in 2020. No acute intracranial abnormality identified. 2. Mild chronic microvascular ischemic changes and moderate volume loss of the brain. Very small chronic infarcts within right cerebellar hemisphere and left caudate head Plan Antivert, IV fluids, may consider repeat MRI with MRA Patient on Eliquis, aspirin on hold, add statin, echocardiogram Physical therapy to evaluate, OT to evaluate  History of DVT Resume Eliquis  History of carotid bruits Carotid ultrasound  History of TIA Resume home medication  History of gait imbalance Evaluated by physical therapy Patient was able to get up and walk to the bathroom alone but then got dizzy and lightheaded  Assessment/Plan Principal Problem:   Dizziness Active Problems:   Essential hypertension; borderline   H/O hypercholesterolemia   Edema of left lower extremity   Exertional dyspnea   Right carotid bruit   TIA (transient ischemic attack)   DVT, lower extremity, distal, acute, left (HCC)   Chest pain      DVT prophylaxis:  Eliquis Code Status: Full code Family Communication: None. Disposition Plan: Admit observation discharge home Consults called: No Admission status: Observation   Jancarlo Biermann G Malik Paar MD [facility]  If 7PM-7AM, please contact night-coverage [email or web address]   02/14/2019, 3:21 AM

## 2019-02-14 NOTE — ED Notes (Signed)
EKG completed per Dr. Raechel Chute request

## 2019-02-14 NOTE — ED Notes (Addendum)
Pt transferred to ED rm 8. Report given to Aimee, RN. All questions answered, care endorsed

## 2019-02-14 NOTE — ED Notes (Signed)
Pt in on the phone with her son. Denies pain this morning Ate most of her breakfast except the fruit

## 2019-02-14 NOTE — ED Notes (Addendum)
All meds given per [facility]. Name/DOB verified with pt Pt remains resting on cart in NAD. VSS. Breathing easy, non-labored.

## 2019-02-14 NOTE — ED Notes (Signed)
ED Provider at bedside. 

## 2019-02-14 NOTE — ED Provider Notes (Signed)
After receiving Benadryl for contrast allergy, patient reports feeling dizzy.  She reports feeling numb all over and her legs are heavy.  My evaluation patient appears anxious and is tachypneic.  She can move all extremities.  No focal weakness is noted.  Distal pulses intact.  Suspect she is having a reaction to IV Benadryl. She is awaiting CT chest to rule out PE Repeat EKG unchanged  EKG Interpretation  Date/Time:  Thursday February 14 2019 00:04:26 EST Ventricular Rate:  90 PR Interval:  132 QRS Duration: 121 QT Interval:  395 QTC Calculation: 484 R Axis:   -34 Text Interpretation: Sinus rhythm Right bundle branch block No significant change since last tracing Confirmed by Ripley Fraise [phone]) on 02/14/2019 12:11:49 AM         Ripley Fraise, MD 02/14/19 0013

## 2019-02-14 NOTE — ED Notes (Signed)
procalcitonin drawn, labeled with 2 pt identifiers, and sent to lab

## 2019-02-14 NOTE — ED Notes (Signed)
Phlebotomy at bedside.

## 2019-02-14 NOTE — Care Management (Signed)
1722 02-14-19 Case Manger received consult for outpatient physical therapy- Case Manager spoke with patient and she is agreeable to services. Ambulatory referral sent to the [facility]. Office will call the patient within 3-4 business days to schedule. No further needs from Case Manager at this time. Bethena Roys, RN,BSN Case Manager

## 2019-02-14 NOTE — Discharge Summary (Signed)
Physician Discharge Summary  [name] [id number] DOB: 1927-03-05 DOA: 02/13/2019  PCP: Lavone Orn, MD  Admit date: 02/13/2019 Discharge date: 02/14/2019  Admitted From: home Disposition:  home Recommendations for Outpatient Follow-up:  1. Follow up with PCP in 1-2 weeks 2. Please obtain BMP/CBC in one week   Home Health: Outpatient physical therapy Equipment/Devices: None  Discharge Condition stable and improved CODE STATUS full code Diet recommendation cardiac diet Brief/Interim Summary:86 y.o. female with medical history significant of hypertension, hyperlipidemia, TIA, hypercholesterolemia, osteopenia, DVT, COVID-19 came with a chief complaint of shortness of breath, tachypnea, chest pain, chest tightness and dizziness.  She was diagnosed with COVID-19 on first of January..  She was doing okay at home until yesterday when she developed shortness of breath, chest tightness and dizziness, spinning sensation. She denies severe fevers chills productive cough.  She denies swelling in her legs. She does report right ankle pain and swelling and states that she fell 2 weeks ago while she was sick with Covid.  ED Course:  In the emergency room She was anxious tachypneic not hypoxic on room air Some chest tightness Complaining of dizziness spinning denies any vomiting Was able to walk to the bathroom with some help then she got dizzy lightheaded History of gait problem after the TIA Electrocardiogram right bundle branch block normal sinus Troponin negative  CT angio chest  No evidence of pulmonary embolism. 2. Mild atelectatic changes in the lung bases. More patchy ground-glass opacity in the right lower lobe may reflect an inflammatory or infectious process. Given some adjacent airways thickening and secretions sequela of aspiration is certainly possible. 3. Coronary artery calcifications. Discharge Diagnoses:  Principal Problem:   Dizziness Active Problems:    Essential hypertension; borderline   H/O hypercholesterolemia   Edema of left lower extremity   Exertional dyspnea   Right carotid bruit   TIA (transient ischemic attack)   DVT, lower extremity, distal, acute, left (HCC)   Chest pain   #1 atypical chest pain with shortness of breath-no hypoxia.  CT shows no evidence of pulmonary embolism but more patchy groundglass opacity in the right lower lobe may reflect inflammatory or infectious process. Cardiac enzymes negative  EKG no acute changes Echo with normal ejection fraction and mild diastolic dysfunction and mild left ear Patient had Covid beginning of January 2021. I will discharge her on Augmentin for 4 days.  Continue probiotics.  #2 dizziness likely secondary to #1.  Patient was ambulated with PT without any dizziness.  Continue Eliquis and meclizine.  Patient has history of chronic dizziness.  Carotid ultrasound shows no evidence of significant blockage.  #3 history of TIA continue Eliquis 5 mg twice a day.  Patient was taking Eliquis 5 mg daily instead of twice a day at home.    Estimated body mass index is 22.06 kg/m as calculated from the following:   Height as of this encounter: 5\' 3"  (1.6 m).   Weight as of this encounter: 56.5 kg.  Discharge Instructions  Discharge Instructions    Call MD for:  difficulty breathing, headache or visual disturbances   Complete by: As directed    Call MD for:  persistant dizziness or light-headedness   Complete by: As directed    Call MD for:  persistant nausea and vomiting   Complete by: As directed    Call MD for:  temperature >100.4   Complete by: As directed    Diet - low sodium heart healthy   Complete by: As directed  Increase activity slowly   Complete by: As directed      Allergies as of 02/14/2019      Reactions   Ivp Dye [iodinated Diagnostic Agents] Hives   Myrbetriq [mirabegron] Other (See Comments)   "Makes me urinate more frequently"      Medication List     STOP taking these medications   aspirin EC 81 MG tablet     TAKE these medications   acetaminophen 500 MG tablet Commonly known as: TYLENOL Take 500-1,000 mg by mouth every 8 (eight) hours as needed for mild pain.   albuterol 108 (90 Base) MCG/ACT inhaler Commonly known as: VENTOLIN HFA Inhale 2 puffs into the lungs every 4 (four) hours as needed for wheezing or shortness of breath.   amoxicillin-clavulanate 875-125 MG tablet Commonly known as: Augmentin Take 1 tablet by mouth 2 (two) times daily for 4 days.   apixaban 5 MG Tabs tablet Commonly known as: Eliquis Take 1 tablet (5 mg total) by mouth 2 (two) times daily. What changed:   when to take this  Another medication with the same name was removed. Continue taking this medication, and follow the directions you see here.   ascorbic acid 500 MG tablet Commonly known as: VITAMIN C Take 500-1,000 mg by mouth daily with breakfast.   Centrum Silver Ultra Womens Tabs Take 1 tablet by mouth daily with breakfast.   AIRBORNE PO Take 1 tablet by mouth daily with breakfast.   Culturelle Caps Take 1 capsule by mouth every morning.   lisinopril 10 MG tablet Commonly known as: ZESTRIL Take 10 mg by mouth daily.   Magnesium 250 MG Tabs Take 250 mg by mouth daily as needed (leg cramps).   meclizine 25 MG tablet Commonly known as: ANTIVERT Take 1 tablet (25 mg total) by mouth 2 (two) times daily as needed for dizziness.   metoprolol succinate 50 MG 24 hr tablet Commonly known as: TOPROL-XL Take 50 mg by mouth daily.   pravastatin 20 MG tablet Commonly known as: PRAVACHOL Take 20 mg by mouth daily.   SAMBUCUS ELDERBERRY PO Take 1 tablet by mouth daily after breakfast.   Trospium Chloride 60 MG Cp24 Take 60 mg by mouth daily.   Vitamin D-3 25 MCG (1000 UT) Caps Take 1,000 Units by mouth daily with breakfast.   zinc gluconate 50 MG tablet Take 50-100 mg by mouth daily with breakfast.      Follow-up Information     Lavone Orn, MD Follow up.   Specialty: Internal Medicine Contact information: [address], [address] [place] [address] [phone]        Leonie Man, MD .   Specialty: Cardiology Contact information: [address] [place] [address] [place] [address] [phone]          Allergies  Allergen Reactions  . Ivp Dye [Iodinated Diagnostic Agents] Hives  . Myrbetriq [Mirabegron] Other (See Comments)    "Makes me urinate more frequently"    Consultations:  None   Procedures/Studies: DG Chest 2 View  Result Date: 02/13/2019 CLINICAL DATA:  Shortness of breath and chest pain. EXAM: CHEST - 2 VIEW COMPARISON:  January 18, 2019 FINDINGS: The lungs are hyperinflated. Mild chronic appearing increased interstitial lung markings are seen. Is stable in appearance when compared to the prior study. There is no evidence of acute infiltrate, pleural effusion or pneumothorax. The heart size and mediastinal contours are within normal limits. Multilevel degenerative changes seen throughout the thoracic spine. IMPRESSION: 1. Chronic  appearing increased interstitial lung markings without evidence of acute or active cardiopulmonary disease. Electronically Signed   By: Virgina Norfolk M.D.   On: 02/13/2019 17:02   DG Ankle Complete Right  Result Date: 02/13/2019 CLINICAL DATA:  Fall with pain and swelling. EXAM: RIGHT ANKLE - COMPLETE 3+ VIEW COMPARISON:  None. FINDINGS: Medial and lateral soft tissue swelling but no evidence of fracture or dislocation. IMPRESSION: Soft tissue swelling but no sign of fracture or dislocation. Electronically Signed   By: Nelson Chimes M.D.   On: 02/13/2019 20:44   CT Angio Chest PE W and/or Wo Contrast  Result Date: 02/14/2019 CLINICAL DATA:  Shortness of breath and palpitations for 2 days. EXAM: CT ANGIOGRAPHY CHEST WITH CONTRAST TECHNIQUE: Multidetector CT imaging of the chest was performed using the standard protocol during bolus  administration of intravenous contrast. Multiplanar CT image reconstructions and MIPs were obtained to evaluate the vascular anatomy. CONTRAST:  56mL OMNIPAQUE IOHEXOL 350 MG/ML SOLN COMPARISON:  Chest radiograph 02/13/2019, CT 04/21/2010 FINDINGS: Cardiovascular: Satisfactory opacification the pulmonary arteries to the segmental level. No pulmonary artery filling defects are identified. Central pulmonary arteries are normal caliber. Suboptimal opacification of the thoracic aorta. No gross luminal abnormality is seen. No periaortic stranding or hemorrhage. Normal caliber thoracic aorta. Minimal atherosclerotic plaque. Normal branching of the great vessels. Proximal great vessels are free of acute abnormality. Normal cardiac size. No pericardial effusion. Some coarse calcification is seen upon the mitral valve annulus and aortic valve leaflets. Coronary artery calcifications are present. Mediastinum/Nodes: No pathologically enlarged mediastinal, hilar or axillary adenopathy. The thyroid gland, trachea, and esophagus demonstrate no significant findings. Lungs/Pleura: There are some atelectatic changes in the lung bases. More patchy ground-glass is seen in the right lower lobe with airways thickening and a few mucus impacted airways. No convincing features of edema. No pneumothorax. No visible effusion. No worrisome nodules or masses. Upper Abdomen: No acute abnormalities present in the visualized portions of the upper abdomen. Musculoskeletal: Multilevel degenerative changes are present in the imaged portions of the spine. Additional degenerative changes in the shoulders. No acute osseous abnormality or suspicious osseous lesion. No worrisome chest wall lesions. Review of the MIP images confirms the above findings. IMPRESSION: 1. No evidence of pulmonary embolism. 2. Mild atelectatic changes in the lung bases. More patchy ground-glass opacity in the right lower lobe may reflect an inflammatory or infectious process.  Given some adjacent airways thickening and secretions sequela of aspiration is certainly possible. 3. Coronary artery calcifications. 4.  Aortic Atherosclerosis (ICD10-I70.0). Electronically Signed   By: Lovena Le M.D.   On: 02/14/2019 00:54   DG Chest Portable 1 View  Result Date: 01/18/2019 CLINICAL DATA:  COVID-19 symptoms. EXAM: PORTABLE CHEST 1 VIEW COMPARISON:  October 14, 2018. FINDINGS: The heart size and mediastinal contours are within normal limits. Both lungs are clear. No pneumothorax or pleural effusion is noted. The visualized skeletal structures are unremarkable. IMPRESSION: No active disease. Electronically Signed   By: Marijo Conception M.D.   On: 01/18/2019 14:50   ECHOCARDIOGRAM COMPLETE  Result Date: 02/14/2019   ECHOCARDIOGRAM REPORT   Patient Name:   NATURE MILLET Date of Exam: 02/14/2019 Medical Rec #:  [id number]     Height:       63.0 in Accession #:    [id number]    Weight:       124.6 lb Date of Birth:  [birth year]     BSA:          1.58  m Patient Age:    68 years      BP:           134/72 mmHg Patient Gender: F             HR:           75 bpm. Exam Location:  Inpatient Procedure: 2D Echo, Cardiac Doppler and Color Doppler Indications:    Chest pain  History:        Patient has prior history of Echocardiogram examinations, most                 recent 02/16/2018. TIA, Arrythmias:RBBB, Signs/Symptoms:Dyspnea;                 Risk Factors:Hypertension and Dyslipidemia. Covid positive.  Sonographer:    Dustin Flock Referring Phys: [id number] [place]  1. Left ventricular ejection fraction, by visual estimation, is 60 to 65%. The left ventricle has normal function. There is no left ventricular hypertrophy.  2. Left ventricular diastolic parameters are consistent with Grade I diastolic dysfunction (impaired relaxation).  3. The left ventricle has no regional wall motion abnormalities.  4. Global right ventricle has normal systolic function.The right ventricular  size is normal. No increase in right ventricular wall thickness.  5. Left atrial size was moderately dilated.  6. Right atrial size was normal.  7. The mitral valve is normal in structure. Mild mitral valve regurgitation. No evidence of mitral stenosis.  8. The tricuspid valve is normal in structure.  9. The tricuspid valve is normal in structure. Tricuspid valve regurgitation is mild. 10. Aortic valve area, by VTI measures 1.28 cm. 11. Aortic valve mean gradient measures 12.0 mmHg. 12. Aortic valve peak gradient measures 20.6 mmHg. 13. The aortic valve is tricuspid. Aortic valve regurgitation is not visualized. Mild aortic valve stenosis. 14. The pulmonic valve was normal in structure. Pulmonic valve regurgitation is trivial. 15. Normal pulmonary artery systolic pressure. 16. The inferior vena cava is normal in size with greater than 50% respiratory variability, suggesting right atrial pressure of 3 mmHg. FINDINGS  Left Ventricle: Left ventricular ejection fraction, by visual estimation, is 60 to 65%. The left ventricle has normal function. The left ventricle has no regional wall motion abnormalities. There is no left ventricular hypertrophy. Left ventricular diastolic parameters are consistent with Grade I diastolic dysfunction (impaired relaxation). Indeterminate filling pressures. Right Ventricle: The right ventricular size is normal. No increase in right ventricular wall thickness. Global RV systolic function is has normal systolic function. The tricuspid regurgitant velocity is 2.18 m/s, and with an assumed right atrial pressure  of 3 mmHg, the estimated right ventricular systolic pressure is normal at 22.1 mmHg. Left Atrium: Left atrial size was moderately dilated. Right Atrium: Right atrial size was normal in size Pericardium: There is no evidence of pericardial effusion. Mitral Valve: The mitral valve is normal in structure. Mild mitral valve regurgitation, with posteriorly-directed jet. No evidence of  mitral valve stenosis by observation. Tricuspid Valve: The tricuspid valve is normal in structure. Tricuspid valve regurgitation is mild. Aortic Valve: The aortic valve is tricuspid. . There is mild thickening and mild calcification of the aortic valve. Aortic valve regurgitation is not visualized. Mild aortic stenosis is present. There is mild thickening of the aortic valve. There is mild  calcification of the aortic valve. Aortic valve mean gradient measures 12.0 mmHg. Aortic valve peak gradient measures 20.6 mmHg. Aortic valve area, by VTI measures 1.28 cm. Pulmonic Valve: The pulmonic valve  was normal in structure. Pulmonic valve regurgitation is trivial. Pulmonic regurgitation is trivial. Aorta: The aortic root, ascending aorta and aortic arch are all structurally normal, with no evidence of dilitation or obstruction. Venous: The inferior vena cava is normal in size with greater than 50% respiratory variability, suggesting right atrial pressure of 3 mmHg. IAS/Shunts: No atrial level shunt detected by color flow Doppler. There is no evidence of a patent foramen ovale. No ventricular septal defect is seen or detected. There is no evidence of an atrial septal defect.  LEFT VENTRICLE PLAX 2D LVIDd:         3.50 cm  Diastology LVIDs:         1.80 cm  LV e' lateral:   7.18 cm/s LV PW:         0.90 cm  LV E/e' lateral: 14.3 LV IVS:        1.00 cm  LV e' medial:    8.59 cm/s LVOT diam:     1.90 cm  LV E/e' medial:  12.0 LV SV:         41 ml LV SV Index:   25.91 LVOT Area:     2.84 cm  RIGHT VENTRICLE RV Basal diam:  2.60 cm RV S prime:     10.30 cm/s TAPSE (M-mode): 2.7 cm LEFT ATRIUM             Index       RIGHT ATRIUM           Index LA diam:        2.90 cm 1.83 cm/m  RA Area:     10.10 cm LA Vol (A2C):   37.7 ml 23.84 ml/m RA Volume:   21.80 ml  13.79 ml/m LA Vol (A4C):   60.7 ml 38.39 ml/m LA Biplane Vol: 50.6 ml 32.00 ml/m  AORTIC VALVE AV Area (Vmax):    1.27 cm AV Area (Vmean):   1.12 cm AV Area  (VTI):     1.28 cm AV Vmax:           227.00 cm/s AV Vmean:          169.000 cm/s AV VTI:            0.553 m AV Peak Grad:      20.6 mmHg AV Mean Grad:      12.0 mmHg LVOT Vmax:         102.00 cm/s LVOT Vmean:        66.800 cm/s LVOT VTI:          0.249 m LVOT/AV VTI ratio: 0.45  AORTA Ao Root diam: 2.70 cm MITRAL VALVE                         TRICUSPID VALVE MV Area (PHT): 3.08 cm              TR Peak grad:   19.1 mmHg MV PHT:        71.34 msec            TR Vmax:        223.00 cm/s MV Decel Time: 246 msec MV E velocity: 103.00 cm/s 103 cm/s  SHUNTS MV A velocity: 113.00 cm/s 70.3 cm/s Systemic VTI:  0.25 m MV E/A ratio:  0.91        1.5       Systemic Diam: 1.90 cm  Skeet Latch MD Electronically signed by Skeet Latch MD Signature  Date/Time: 02/14/2019/3:38:56 PM    Final    VAS US CAROTID  Result Date: 02/14/2019 Carotid Arterial Duplex Study Indications:       Patient chief complaint of shortness of breath, tachypnea,                    chest pain, chest tightness and dizziness. Risk Factors:      Hypertension, hyperlipidemia. Other Factors:     Covid 19 positive two weeks ago. Comparison Study:  Carotid on 02/16/18 showed no significant stenosis/plaque.                    Stable today. Performing Technologist: Oda Cogan RDMS, RVT  Examination Guidelines: A complete evaluation includes B-mode imaging, spectral Doppler, color Doppler, and power Doppler as needed of all accessible portions of each vessel. Bilateral testing is considered an integral part of a complete examination. Limited examinations for reoccurring indications may be performed as noted.  Right Carotid Findings: +----------+--------+--------+--------+------------------+--------+           PSV cm/sEDV cm/sStenosisPlaque DescriptionComments +----------+--------+--------+--------+------------------+--------+ CCA Prox  94      20                                          +----------+--------+--------+--------+------------------+--------+ CCA Distal90      13                                         +----------+--------+--------+--------+------------------+--------+ ICA Prox  58      16                                         +----------+--------+--------+--------+------------------+--------+ ICA Distal120     28                                         +----------+--------+--------+--------+------------------+--------+ ECA       60      20                                         +----------+--------+--------+--------+------------------+--------+ +----------+--------+-------+----------------+-------------------+           PSV cm/sEDV cmsDescribe        Arm Pressure (mmHG) +----------+--------+-------+----------------+-------------------+ [id number]             Multiphasic, WNL                    +----------+--------+-------+----------------+-------------------+ +---------+--------+--+--------+--+---------+ VertebralPSV cm/s62EDV cm/s15Antegrade +---------+--------+--+--------+--+---------+  Left Carotid Findings: +----------+--------+--------+--------+------------------+--------+           PSV cm/sEDV cm/sStenosisPlaque DescriptionComments +----------+--------+--------+--------+------------------+--------+ CCA Prox  88      16                                         +----------+--------+--------+--------+------------------+--------+ CCA Distal57      14                                         +----------+--------+--------+--------+------------------+--------+  ICA Prox  61      16                                         +----------+--------+--------+--------+------------------+--------+ ICA Distal76      26                                         +----------+--------+--------+--------+------------------+--------+ ECA       70                                                  +----------+--------+--------+--------+------------------+--------+ +----------+--------+--------+----------------+-------------------+           PSV cm/sEDV cm/sDescribe        Arm Pressure (mmHG) +----------+--------+--------+----------------+-------------------+ [id number]             Multiphasic, WNL                    +----------+--------+--------+----------------+-------------------+ +---------+--------+--+--------+--+---------+ VertebralPSV cm/s59EDV cm/s14Antegrade +---------+--------+--+--------+--+---------+   Summary: Right Carotid: The extracranial vessels were near-normal with only minimal wall                thickening or plaque. Left Carotid: The extracranial vessels were near-normal with only minimal wall               thickening or plaque. Vertebrals:  Bilateral vertebral arteries demonstrate antegrade flow. Subclavians: Normal flow hemodynamics were seen in bilateral subclavian              arteries. *See table(s) above for measurements and observations.  Electronically signed by Antony Contras MD on 02/14/2019 at 12:49:27 PM.    Final     (Echo, Carotid, EGD, Colonoscopy, ERCP)    Subjective: She is resting in bed walked to the restroom no chest pain shortness of breath nausea vomiting headache reported  Discharge Exam: Vitals:   02/14/19 1400 02/14/19 1402  BP:  134/72  Pulse: 69 75  Resp: 18 17  Temp:  98 F (36.7 C)  SpO2: 100% 100%   Vitals:   02/14/19 1131 02/14/19 1300 02/14/19 1400 02/14/19 1402  BP: (!) 148/75 (!) 159/73  134/72  Pulse: 84 75 69 75  Resp: 13 (!) 26 18 17   Temp: 97.7 F (36.5 C)   98 F (36.7 C)  TempSrc: Oral   Oral  SpO2: 100% 100% 100% 100%  Weight: 56.5 kg     Height: 5\' 3"  (1.6 m)       General: Pt is alert, awake, not in acute distress Cardiovascular: RRR, S1/S2 +, no rubs, no gallops Respiratory: Few scattered rhonchi bilaterally, no wheezing, no rhonchi Abdominal: Soft, NT, ND, bowel sounds + Extremities: no  edema, no cyanosis    The results of significant diagnostics from this hospitalization (including imaging, microbiology, ancillary and laboratory) are listed below for reference.     Microbiology: No results found for this or any previous visit (from the past 240 hour(s)).   Labs: BNP (last 3 results) No results for input(s): BNP in the last 8760 hours. Basic Metabolic Panel: Recent Labs  Lab 02/13/19 1627 02/14/19 0514  NA 137 138  K 4.2 4.3  CL 103  --  CO2 24  --   GLUCOSE 94  --   BUN 17  --   CREATININE 1.14*  --   CALCIUM 9.4  --    Liver Function Tests: No results for input(s): AST, ALT, ALKPHOS, BILITOT, PROT, ALBUMIN in the last 168 hours. No results for input(s): LIPASE, AMYLASE in the last 168 hours. No results for input(s): AMMONIA in the last 168 hours. CBC: Recent Labs  Lab 02/13/19 1627 02/14/19 0427 02/14/19 0514  WBC 8.0 4.5  --   NEUTROABS  --  3.4  --   HGB 11.0* 10.7* 10.5*  HCT 31.2* 30.2* 31.0*  MCV 84.3 83.9  --   PLT 222 191  --    Cardiac Enzymes: No results for input(s): CKTOTAL, CKMB, CKMBINDEX, TROPONINI in the last 168 hours. BNP: Invalid input(s): POCBNP CBG: No results for input(s): GLUCAP in the last 168 hours. D-Dimer No results for input(s): DDIMER in the last 72 hours. Hgb A1c No results for input(s): HGBA1C in the last 72 hours. Lipid Profile No results for input(s): CHOL, HDL, LDLCALC, TRIG, CHOLHDL, LDLDIRECT in the last 72 hours. Thyroid function studies No results for input(s): TSH, T4TOTAL, T3FREE, THYROIDAB in the last 72 hours.  Invalid input(s): FREET3 Anemia work up No results for input(s): VITAMINB12, FOLATE, FERRITIN, TIBC, IRON, RETICCTPCT in the last 72 hours. Urinalysis    Component Value Date/Time   COLORURINE STRAW (A) 05/31/2018 1811   APPEARANCEUR CLEAR 05/31/2018 1811   LABSPEC 1.005 05/31/2018 1811   PHURINE 6.0 05/31/2018 1811   GLUCOSEU NEGATIVE 05/31/2018 1811   HGBUR NEGATIVE 05/31/2018  1811   BILIRUBINUR NEGATIVE 05/31/2018 1811   BILIRUBINUR neg 08/24/2011 1746   KETONESUR NEGATIVE 05/31/2018 1811   PROTEINUR NEGATIVE 05/31/2018 1811   UROBILINOGEN negative 08/24/2011 1746   UROBILINOGEN 0.2 09/30/2010 1626   NITRITE NEGATIVE 05/31/2018 1811   LEUKOCYTESUR NEGATIVE 05/31/2018 1811   Sepsis Labs Invalid input(s): PROCALCITONIN,  WBC,  LACTICIDVEN Microbiology No results found for this or any previous visit (from the past 240 hour(s)).   Time coordinating discharge: 35 minutes  SIGNED:   Georgette Shell, MD  [facility] 02/14/2019, 3:56 PM Pager   If 7PM-7AM, please contact night-coverage [email or web address] Password [email or web address]

## 2019-02-14 NOTE — ED Notes (Signed)
Admitting MD at bedside.

## 2019-02-14 NOTE — ED Notes (Signed)
ABG collected by RT

## 2019-02-14 NOTE — Evaluation (Signed)
Physical Therapy Evaluation Patient Details Name: [name] MRN: [id number] DOB: December 13, 1927 Today's Date: 02/14/2019   History of Present Illness  86yo female c/o SOB, tachypnea chest pain and tightness, dizziness; also reports fall when she was sick with covid (diagnosed January 1st). PE negative, clear of ankle fracture. PMH osteopenia, macular degeneration, HTN, HLD, TIA, chronic cerebellar infarcts  Clinical Impression   Patient received in bed, very pleasant and gracious with therapy today. See mobility levels below. Grossly unsteady during dynamic tasks without BUE support but able to maintain balance with S-min guard. Easily fatigued with mobility. Monitor did flag some NSVT and V-tach, however this appeared to be artifact as waves resolved when resting and this PT did not see any true waves. VSS on RA, although HR up to 117 at most this session. Advised her to use RW for a bit at home until she is able to get stronger and more steady, she gives verbal agreement.  She was left up in the chair with all needs met, alarm active and family present. See recommendations below.     Follow Up Recommendations Outpatient PT;Supervision for mobility/OOB    Equipment Recommendations  None recommended by PT    Recommendations for Other Services       Precautions / Restrictions Precautions Precautions: Fall Precaution Comments: watch heart rhythm, HR Restrictions Weight Bearing Restrictions: No      Mobility  Bed Mobility Overal bed mobility: Modified Independent             General bed mobility comments: used rails.  Transfers Overall transfer level: Needs assistance Equipment used: None Transfers: Sit to/from Stand Sit to Stand: Supervision Stand pivot transfers: Supervision       General transfer comment: S for safety, no LOB but grossly unsteady- able to maintain balance without external support but requires close S for safety  Ambulation/Gait Ambulation/Gait  assistance: Min guard Gait Distance (Feet): 90 Feet Assistive device: None Gait Pattern/deviations: Step-through pattern;Decreased step length - right;Decreased step length - left;Wide base of support;Drifts right/left Gait velocity: decreased   General Gait Details: wide BOS and mildy unsteady but able to maintain balance with min guard; easily fatigued. Monitor showed V-tach but upon examination seemed to be artifact  Stairs            Wheelchair Mobility    Modified Rankin (Stroke Patients Only)       Balance Overall balance assessment: Needs assistance   Sitting balance-Leahy Scale: Good Sitting balance - Comments: mild difficulty with dynamic tasks in sitting   Standing balance support: No upper extremity supported;During functional activity Standing balance-Leahy Scale: Fair Standing balance comment: able to maintain balance but grossly unsteady- would benefit from external support                             Pertinent Vitals/Pain Pain Assessment: No/denies pain Pain Score: 0-No pain Pain Location: R ankle. during covid she fell.  Remote nurse looked at iit. Xrays never done. Pt has walked on it since. Pain Descriptors / Indicators: Sore Pain Intervention(s): Limited activity within patient's tolerance;Monitored during session    Home Living Family/patient expects to be discharged to:: Private residence Living Arrangements: Spouse/significant other Available Help at Discharge: Family;Available 24 hours/day Type of Home: House Home Access: Level entry     Home Layout: One level Home Equipment: Grab bars - tub/shower;Walker - 2 wheels      Prior Function Level of Independence: Independent  Comments: Pt independent driving     Hand Dominance   Dominant Hand: Right    Extremity/Trunk Assessment   Upper Extremity Assessment Upper Extremity Assessment: Defer to OT evaluation    Lower Extremity Assessment Lower Extremity  Assessment: Generalized weakness    Cervical / Trunk Assessment Cervical / Trunk Assessment: Normal  Communication   Communication: No difficulties  Cognition Arousal/Alertness: Awake/alert Behavior During Therapy: WFL for tasks assessed/performed Overall Cognitive Status: Within Functional Limits for tasks assessed                                 General Comments: very pleasant and gracious      General Comments General comments (skin integrity, edema, etc.): HR to 117, SpO2 above 90% on room air; intermittent V-tach and NSVT warnings from monitor but this seemed to be artifact as it resolved immediately when holding still, no true waves seen by this therapist    Exercises     Assessment/Plan    PT Assessment Patient needs continued PT services  PT Problem List Decreased strength;Decreased activity tolerance;Decreased safety awareness;Decreased balance;Decreased mobility;Cardiopulmonary status limiting activity       PT Treatment Interventions DME instruction;Balance training;Gait training;Stair training;Functional mobility training;Patient/family education;Therapeutic activities;Therapeutic exercise    PT Goals (Current goals can be found in the Care Plan section)  Acute Rehab PT Goals Patient Stated Goal: to go home today PT Goal Formulation: With patient Time For Goal Achievement: 02/28/19 Potential to Achieve Goals: Good    Frequency Min 3X/week   Barriers to discharge        Co-evaluation               AM-PAC PT "6 Clicks" Mobility  Outcome Measure Help needed turning from your back to your side while in a flat bed without using bedrails?: None Help needed moving from lying on your back to sitting on the side of a flat bed without using bedrails?: None Help needed moving to and from a bed to a chair (including a wheelchair)?: A Little Help needed standing up from a chair using your arms (e.g., wheelchair or bedside chair)?: A Little Help  needed to walk in hospital room?: A Little Help needed climbing 3-5 steps with a railing? : A Little 6 Click Score: 20    End of Session Equipment Utilized During Treatment: Gait belt Activity Tolerance: Patient tolerated treatment well Patient left: in chair;with call bell/phone within reach;with chair alarm set;with family/visitor present   PT Visit Diagnosis: Unsteadiness on feet (R26.81);Muscle weakness (generalized) (M62.81)    Time: 5537-4827 PT Time Calculation (min) (ACUTE ONLY): 25 min   Charges:   PT Evaluation $PT Eval Moderate Complexity: 1 Mod PT Treatments $Gait Training: 8-22 mins         Windell Norfolk, DPT, PN1   Supplemental Physical Therapist [place]    Pager [phone] Acute Rehab Office [phone]

## 2019-02-14 NOTE — ED Notes (Signed)
Pt taken to and from CT without incidence 

## 2019-02-14 NOTE — ED Notes (Signed)
Pt ambulatory in hallway and to bathroom with tech. Strong, steady gait noted

## 2019-02-15 LAB — HIGH SENSITIVITY CRP: CRP, High Sensitivity: 3.29 mg/L — ABNORMAL HIGH (ref 0.00–3.00)

## 2019-03-04 ENCOUNTER — Ambulatory Visit: Payer: Medicare PPO | Attending: Internal Medicine | Admitting: Physical Therapy

## 2019-03-04 ENCOUNTER — Other Ambulatory Visit: Payer: Self-pay

## 2019-03-04 ENCOUNTER — Encounter: Payer: Self-pay | Admitting: Physical Therapy

## 2019-03-04 VITALS — BP 140/70 | HR 98

## 2019-03-04 DIAGNOSIS — R42 Dizziness and giddiness: Secondary | ICD-10-CM | POA: Diagnosis not present

## 2019-03-04 DIAGNOSIS — R2681 Unsteadiness on feet: Secondary | ICD-10-CM | POA: Insufficient documentation

## 2019-03-04 DIAGNOSIS — M25571 Pain in right ankle and joints of right foot: Secondary | ICD-10-CM | POA: Diagnosis present

## 2019-03-04 DIAGNOSIS — M6281 Muscle weakness (generalized): Secondary | ICD-10-CM | POA: Diagnosis present

## 2019-03-04 DIAGNOSIS — R262 Difficulty in walking, not elsewhere classified: Secondary | ICD-10-CM | POA: Insufficient documentation

## 2019-03-05 NOTE — Therapy (Signed)
[facility] [address] [place], [place], [address] Phone: [phone]   Fax:  [phone]  Physical Therapy Evaluation  Patient Details  Name: [name] MRN: [id number] Date of Birth: Apr 04, 1927 Referring Provider (PT): Referred by hospital physician - Catherine Shell, MD; will send cert to PCP Catherine Orn, MD   Encounter Date: 03/04/2019  PT End of Session - 03/04/19 1803    Visit Number  1    Number of Visits  11    Date for PT Re-Evaluation  05/04/19    Authorization Type  [employer]; $20 copay    Authorization Time Period  TBD    PT Start Time  1239    PT Stop Time  1321    PT Time Calculation (min)  42 min    Activity Tolerance  Patient tolerated treatment well    Behavior During Therapy  Anxious       Past Medical History:  Diagnosis Date  . Abnormal Pap smear 1975-76  . Anemia    history of  . Arthritis    spine  . Breast cyst, left 1980  . Cystocele 2012  . Diverticulosis    with h/o Diverticulitis  . GERD (gastroesophageal reflux disease)   . H/O hypercholesterolemia   . H/O osteopenia   . H/O varicella   . H/O varicose veins   . Hx of Mumps   . Macular degeneration   . Seasonal allergies   . Urge incontinence 2012  . Urinary frequency 2010    Past Surgical History:  Procedure Laterality Date  . ABDOMINAL HYSTERECTOMY    . BLADDER SUSPENSION N/A 06/16/2016   Procedure: TRANSVAGINAL TAPE (TVT) PROCEDURE;  Surgeon: Everett Graff, MD;  Location: [facility];  Service: Gynecology;  Laterality: N/A;  . BREAST CYST ASPIRATION  1964  . COLONOSCOPY     Dr. Earlean Shawl  . CYSTOCELE REPAIR N/A 06/16/2016   Procedure: ANTERIOR REPAIR (CYSTOCELE);  Surgeon: Everett Graff, MD;  Location: [facility];  Service: Gynecology;  Laterality: N/A;  . CYSTOSCOPY N/A 06/16/2016   Procedure: CYSTOSCOPY;  Surgeon: Everett Graff, MD;  Location: [facility];  Service: Gynecology;  Laterality: N/A;  see anterior repair   . Lower Extremity Venous Dopplers  01/09/2012   Right and left lower steroids: No evidence of thrombus or, thrombophlebitis; right and left GSV and SSV: No venous insufficiency. Normal exam.  . NM MYOVIEW LTD  11/2016   6.6 METS.  Reached 106% of max.  Heart rate.  Walk for 4:40 min.  EF 72%.  Normal blood pressure response.  upsloping ST segment depression, nonspecific.  Otherwise normal study.  LOW RISK.  No evidence of ischemia or infarction.  . TRANSTHORACIC ECHOCARDIOGRAM  01/2018   EF > 65%. LV size small. Gr 1 DD (normal for age).   Normal atrial size. Moderate Aortic Sclerosis without Stenosis. Moderate Mitral Annular Calcification with mild MR & no MS    Vitals:   03/04/19 1244  BP: 140/70  Pulse: 98  SpO2: 99%     Subjective Assessment - 03/04/19 1247    Subjective  Tested positive for COVID on January 1 and then admitted to the hospital for chest pain, difficulty breathing and dizziness.  Treated for PNA with antibiotics and steroid.  Still having some shortness of breath and decreased activity tolerance/endurance.  Referred to therapy for ongoing dizziness.  Dizziness began after COVID.  Having balance issues.  When pt had COVID she had one fall from  passing out and sprained R ankle, still gets sore.    Pertinent History  DVT, TIA, anemia, arthritis, osteopenia, macular degeneration, dizziness and falls    Diagnostic tests  CT scan    Patient Stated Goals  To get rid of the dizziness    Currently in Pain?  Yes    Pain Score  3     Pain Location  Ankle    Pain Orientation  Right    Pain Descriptors / Indicators  Aching         [facility] PT Assessment - 03/04/19 1252      Assessment   Medical Diagnosis  Dizziness, post COVID    Referring Provider (PT)  Referred by hospital physician - Catherine Shell, MD; will send cert to PCP Catherine Orn, MD    Onset Date/Surgical Date  01/18/19    Prior Therapy  yes neuro outpatient for dizziness      Precautions   Precautions   Other (comment);Fall    Precaution Comments  DVT, TIA, anemia, arthritis, osteopenia, macular degeneration, dizziness and falls      Balance Screen   Has the patient fallen in the past 6 months  Yes    How many times?  1 when she was sick with COVID-syncope.  Sprained R ankle    Has the patient had a decrease in activity level because of a fear of falling?   Yes      [place]  Private residence    Living Arrangements  Spouse/significant other    Additional Comments  Had to use a cane after spraining her R ankle; still performs all ADL and household chores but lacks endurance       Prior Function   Level of Independence  Independent      Observation/Other Assessments   Focus on Therapeutic Outcomes (FOTO)   Intake 52%; 48% limited    Other Surveys   Dizziness Handicap Inventory (DHI)    Dizziness Handicap Inventory (DHI)   44      Sensation   Light Touch  Appears Intact      ROM / Strength   AROM / PROM / Strength  Strength      Strength   Overall Strength  Deficits    Overall Strength Comments  R ankle not tested due to edema and pain from sprain; 4-/5 hip flexion bilaterally, 4/5 knee extension/flexion, 4-/5 ankle DF LLE      Ambulation/Gait   Ambulation/Gait  Yes    Ambulation/Gait Assistance  6: Modified independent (Device/Increase time)    Ambulation Distance (Feet)  115 Feet    Assistive device  None    Gait Pattern  Antalgic;Decreased weight shift to right;Decreased stance time - right    Ambulation Surface  Level;Indoor           Vestibular Assessment - 03/04/19 1301      Symptom Behavior   Subjective history of current problem  reports nausea but no vomiting,     Type of Dizziness   Spinning;Imbalance    Frequency of Dizziness  intermittent    Duration of Dizziness  4-5 minutes    Symptom Nature  Spontaneous   woke up with it   Aggravating Factors  Spontaneous onset;Forward bending    Relieving Factors  Comments   walking,  eating, drinking fluids   Progression of Symptoms  Better      Oculomotor Exam   Oculomotor Alignment  Normal    Ocular  ROM  WFL    Spontaneous  Absent    Gaze-induced   Absent    Smooth Pursuits  Intact    Saccades  Intact      Oculomotor Exam-Fixation Suppressed    Gaze evoked nystagmus  '    Left Head Impulse  unable to test, pt guards neck movement    Right Head Impulse  unable to test, pt guards neck movement      Vestibulo-Ocular Reflex   VOR to Slow Head Movement  Normal    VOR Cancellation  Normal      Positional Testing   Dix-Hallpike  Dix-Hallpike Right;Dix-Hallpike Left    Sidelying Test  --    Horizontal Canal Testing  Horizontal Canal Right;Horizontal Canal Left      Dix-Hallpike Right   Dix-Hallpike Right Duration  0    Dix-Hallpike Right Symptoms  No nystagmus      Dix-Hallpike Left   Dix-Hallpike Left Duration  0    Dix-Hallpike Left Symptoms  No nystagmus      Horizontal Canal Right   Horizontal Canal Right Duration  0    Horizontal Canal Right Symptoms  Normal      Horizontal Canal Left   Horizontal Canal Left Duration  0    Horizontal Canal Left Symptoms  Normal      Positional Sensitivities   Sit to Supine  No dizziness    Supine to Left Side  No dizziness    Supine to Right Side  No dizziness    Supine to Sitting  No dizziness    Right Hallpike  No dizziness    Up from Right Hallpike  No dizziness    Up from Left Hallpike  No dizziness    Nose to Right Knee  Mild dizziness    Right Knee to Sitting  Mild dizziness    Nose to Left Knee  Mild dizziness    Left Knee to Sitting  Mild dizziness    Head Turning x 5  Lightheadedness    Head Nodding x 5  Mild dizziness    Pivot Right in Standing  No dizziness    Pivot Left in Standing  No dizziness    Rolling Right  No dizziness    Rolling Left  No dizziness          Objective measurements completed on examination: See above findings.      [facility] Adult PT Treatment/Exercise - 03/05/19  0902      Therapeutic Activites    Therapeutic Activities  Other Therapeutic Activities    Other Therapeutic Activities  donned ASO to R ankle while performing vestibular testing in standing; pt reported improvement in stability and pain - provided pt with information about how to purchase at local pharmacy             PT Education - 03/05/19 0904    Education Details  see TA; clinical findings, PT POC and goals    Person(s) Educated  Patient    Methods  Explanation;Handout    Comprehension  Verbalized understanding       PT Short Term Goals - 03/05/19 0910      PT SHORT TERM GOAL #1   Title  Pt will participate in further balance assessment with gait velocity and FGA - baselines TBD    Time  4    Period  Weeks    Status  New    Target Date  04/04/19      PT SHORT  TERM GOAL #2   Title  Pt will demonstrate independence with initial vestibular/endurance/strengthening/balance HEP    Time  4    Period  Weeks    Status  New    Target Date  05/04/19      PT SHORT TERM GOAL #3   Title  Pt will demonstrate decreased motion sensitivity to looking up/down and bending down to the ground as indicated by decrease in MSQ to 1/5    Baseline  2/5    Time  4    Period  Weeks    Status  New    Target Date  04/04/19      PT SHORT TERM GOAL #4   Title  Pt will demonstrate improved standing/activity tolerance to be able to perform 10 minutes of ADL/house chores or exercise in standing    Time  4    Period  Weeks    Status  New    Target Date  04/04/19      PT SHORT TERM GOAL #5   Title  Patient will ambulate x 500' outside over pavement with supervision and no LOB with visual scanning/head turns    Time  4    Period  Weeks    Status  New    Target Date  04/04/19        PT Long Term Goals - 03/05/19 1008      PT LONG TERM GOAL #1   Title  Pt will demonstrate independence with final HEP and will begin walking around neighborhood    Time  8    Period  Weeks    Status  New     Target Date  05/04/19      PT LONG TERM GOAL #2   Title  Pt will demonstrate no motion sensitivity to vertical head movements and bending down to the ground    Time  8    Period  Weeks    Status  New    Target Date  05/04/19      PT LONG TERM GOAL #3   Title  Pt will improve FGA by 4 points to indicate decreased falls risk in community    Baseline  TBD    Time  8    Period  Weeks    Status  New    Target Date  05/04/19      PT LONG TERM GOAL #4   Title  Pt gait velocity will increase to >/= 2.62 ft/sec    Baseline  TBD    Time  8    Period  Weeks    Status  New    Target Date  05/04/19      PT LONG TERM GOAL #5   Title  Pt will ambulate x 1000' outside over uneven pavement MOD I with no LOB during visual scanning, head turns and L/R turns    Time  8    Period  Weeks    Status  New    Target Date  05/04/19      Additional Long Term Goals   Additional Long Term Goals  Yes      PT LONG TERM GOAL #6   Title  Pt will decrease DHI by 18 points and will improve overall function on FOTO to >= 74%    Baseline  44 DHI, 52% function FOTO    Time  8    Period  Weeks    Status  New    Target Date  05/04/19             Plan - 03/04/19 1804    Clinical Impression Statement  Pt is an 86 year old female referred to Neuro OPPT for evaluation of dizziness and deconditioning after COVID 19 infection.  Pt's PMH is significant for the following: DVT, TIA, anemia, arthritis, osteopenia, macular degeneration, dizziness and falls. The following deficits were noted during pt's exam: impaired activity tolerance and endurance, impaired LE strength, disequilibrium, motion sensitivity, pain and decreased ROM in R ankle and impaired balance placing patient at increased risk for falls. Pt would benefit from skilled PT to address these impairments and functional limitations to maximize functional mobility independence and reduce falls risk.    Personal Factors and Comorbidities   Age;Comorbidity 3+;Finances;Past/Current Experience;Transportation    Comorbidities  DVT, TIA, anemia, arthritis, osteopenia, macular degeneration, dizziness and falls    Examination-Activity Limitations  Bend;Locomotion Level;Reach Overhead;Stand    Examination-Participation Restrictions  Cleaning;Community Activity;Driving;Laundry;Meal Prep    Stability/Clinical Decision Making  Evolving/Moderate complexity    Clinical Decision Making  Moderate    Rehab Potential  Good    PT Frequency  Other (comment)   2x/week x 2; 1x/week x 6   PT Duration  8 weeks    PT Treatment/Interventions  ADLs/Self Care Home Management;DME Instruction;Gait training;Stair training;Functional mobility training;Therapeutic activities;Therapeutic exercise;Balance training;Neuromuscular re-education;Patient/family education;Vestibular;Cryotherapy;Moist Heat;Orthotic Fit/Training;Manual techniques;Passive range of motion    PT Next Visit Plan  Monitor BP each visit.  Did she get an ASO for R ankle?  Assess balance - gait velocity, FGA.  Initiate HEP - monitor R ankle with standing exercises/gait; work on LE strength and functional endurance    Consulted and Agree with Plan of Care  Patient       Patient will benefit from skilled therapeutic intervention in order to improve the following deficits and impairments:  Decreased balance, Decreased endurance, Decreased range of motion, Decreased strength, Difficulty walking, Dizziness, Pain  Visit Diagnosis: Dizziness and giddiness  Unsteadiness on feet  Difficulty in walking, not elsewhere classified  Muscle weakness (generalized)  Pain in right ankle and joints of right foot     Problem List Patient Active Problem List   Diagnosis Date Noted  . Dizziness 02/14/2019  . Chest pain 02/14/2019  . DVT, lower extremity, distal, acute, left ([facility]) 12/24/2018  . Palpitations 05/30/2018  . Irregular heartbeat 03/02/2018  . TIA (transient ischemic attack) 02/15/2018  .  Urinary, incontinence, stress female 06/16/2016  . Right carotid bruit 12/23/2014  . Exertional dyspnea 12/24/2013  . Bilateral arm numbness and tingling while sleeping - and shortly after waking 12/24/2013  . Poor balance 12/24/2013  . Edema of left lower extremity 12/21/2012  . Essential hypertension; borderline   . H/O hypercholesterolemia   . Osteoporosis - of the spine 09/22/2011    Rico Junker, PT, DPT 03/05/19    10:17 AM    [facility] [address] [place], [place], [address] Phone: [phone]   Fax:  [phone]  Name: [name] MRN: [id number] Date of Birth: 01-08-1928

## 2019-03-15 ENCOUNTER — Encounter: Payer: Self-pay | Admitting: Physical Therapy

## 2019-03-15 ENCOUNTER — Ambulatory Visit: Payer: Medicare PPO | Admitting: Physical Therapy

## 2019-03-15 ENCOUNTER — Other Ambulatory Visit: Payer: Self-pay

## 2019-03-15 DIAGNOSIS — M6281 Muscle weakness (generalized): Secondary | ICD-10-CM

## 2019-03-15 DIAGNOSIS — R2681 Unsteadiness on feet: Secondary | ICD-10-CM

## 2019-03-15 DIAGNOSIS — R42 Dizziness and giddiness: Secondary | ICD-10-CM

## 2019-03-15 DIAGNOSIS — M25571 Pain in right ankle and joints of right foot: Secondary | ICD-10-CM

## 2019-03-15 DIAGNOSIS — R262 Difficulty in walking, not elsewhere classified: Secondary | ICD-10-CM

## 2019-03-15 NOTE — Patient Instructions (Addendum)
Gaze Stabilization - Tip Card  1.Target must remain in focus, not blurry, and appear stationary while head is in motion. 2.Perform exercises with small head movements (45 to either side of midline). 3.Increase speed of head motion so long as target is in focus. 4.If you wear eyeglasses, be sure you can see target through lens (therapist will give specific instructions for bifocal / progressive lenses). 5.These exercises may provoke dizziness or nausea. Work through these symptoms. If too dizzy, slow head movement slightly. Rest between each exercise. 6.Exercises demand concentration; avoid distractions. 7.For safety, perform standing exercises close to a counter, wall, corner, or next to someone.  Copyright  [employer]. All rights reserved.   Gaze Stabilization - Standing Feet Apart   Stand with your back against a wall and hands on the back of a chair for support.  Feet shoulder width apart, keeping eyes on target on wall 3 feet away, tilt head down slightly and move head side to side for 30 seconds. Repeat while moving head up and down for 30 seconds.  Do 2-3 sessions per day.   SIT TO STAND: Pillow    Feet: shoulder-width on pillow. Lean chest forward. Raise hips and straighten knees to stand. _12__ reps per set, _2__ sets per day, __5_ days per week. Have chair or counter in front for support if needed.   Foot: Heel Raises    In sitting, keep toes on the ground, press through toes and lift heels up in the air.  Bring back down and repeat 12 times.    Resisted Toe Raises   Setup Begin sitting upright holding one end of a resistance band anchored under one foot and looped around the right.  Bend your foot upward, pulling against the resistance. Hold briefly then slowly lower your foot back down and repeat 12 times. Make sure to keep both heels on the ground during the exercise.   Resisted Ankle Turn Out   Setup Begin sitting in an upright position with a resistance loop  around both feet, flat on the ground.  Lift your Right toes up and to the Right against the resistance of the band.  Try to keep your knee straight.  Repeat 12 times on the Right side

## 2019-03-15 NOTE — Therapy (Signed)
[facility] [address] [place], [place], [address] Phone: [phone]   Fax:  [phone]  Physical Therapy Treatment  Patient Details  Name: [name] MRN: [id number] Date of Birth: 25-Jul-1927 Referring Provider (PT): Referred by hospital physician - Georgette Shell, MD; will send cert to PCP Lavone Orn, MD   Encounter Date: 03/15/2019  PT End of Session - 03/15/19 1603    Visit Number  2    Number of Visits  11    Date for PT Re-Evaluation  05/04/19    Authorization Type  [employer]; $20 copay    PT Start Time  1500    PT Stop Time  1553    PT Time Calculation (min)  53 min    Activity Tolerance  Patient tolerated treatment well    Behavior During Therapy  [facility] for tasks assessed/performed       Past Medical History:  Diagnosis Date  . Abnormal Pap smear 1975-76  . Anemia    history of  . Arthritis    spine  . Breast cyst, left 1980  . Cystocele 2012  . Diverticulosis    with h/o Diverticulitis  . GERD (gastroesophageal reflux disease)   . H/O hypercholesterolemia   . H/O osteopenia   . H/O varicella   . H/O varicose veins   . Hx of Mumps   . Macular degeneration   . Seasonal allergies   . Urge incontinence 2012  . Urinary frequency 2010    Past Surgical History:  Procedure Laterality Date  . ABDOMINAL HYSTERECTOMY    . BLADDER SUSPENSION N/A 06/16/2016   Procedure: TRANSVAGINAL TAPE (TVT) PROCEDURE;  Surgeon: Everett Graff, MD;  Location: [facility];  Service: Gynecology;  Laterality: N/A;  . BREAST CYST ASPIRATION  1964  . COLONOSCOPY     Dr. Earlean Shawl  . CYSTOCELE REPAIR N/A 06/16/2016   Procedure: ANTERIOR REPAIR (CYSTOCELE);  Surgeon: Everett Graff, MD;  Location: [facility];  Service: Gynecology;  Laterality: N/A;  . CYSTOSCOPY N/A 06/16/2016   Procedure: CYSTOSCOPY;  Surgeon: Everett Graff, MD;  Location: [facility];  Service: Gynecology;  Laterality: N/A;  see anterior repair  . Lower  Extremity Venous Dopplers  01/09/2012   Right and left lower steroids: No evidence of thrombus or, thrombophlebitis; right and left GSV and SSV: No venous insufficiency. Normal exam.  . NM MYOVIEW LTD  11/2016   6.6 METS.  Reached 106% of max.  Heart rate.  Walk for 4:40 min.  EF 72%.  Normal blood pressure response.  upsloping ST segment depression, nonspecific.  Otherwise normal study.  LOW RISK.  No evidence of ischemia or infarction.  . TRANSTHORACIC ECHOCARDIOGRAM  01/2018   EF > 65%. LV size small. Gr 1 DD (normal for age).   Normal atrial size. Moderate Aortic Sclerosis without Stenosis. Moderate Mitral Annular Calcification with mild MR & no MS    There were no vitals filed for this visit.  Subjective Assessment - 03/15/19 1519    Subjective  Was able to get an ASO, has been wearing it and it helps.    Pertinent History  DVT, TIA, anemia, arthritis, osteopenia, macular degeneration, dizziness and falls    Diagnostic tests  CT scan    Patient Stated Goals  To get rid of the dizziness         [facility] PT Assessment - 03/15/19 1519      Standardized Balance Assessment   Standardized Balance Assessment  10  meter walk test    10 Meter Walk  13.66 seconds or 2.4 ft/sec      Functional Gait  Assessment   Gait assessed   Yes    Gait Level Surface  Walks 20 ft, slow speed, abnormal gait pattern, evidence for imbalance or deviates 10-15 in outside of the 12 in walkway width. Requires more than 7 sec to ambulate 20 ft.    Change in Gait Speed  Makes only minor adjustments to walking speed, or accomplishes a change in speed with significant gait deviations, deviates 10-15 in outside the 12 in walkway width, or changes speed but loses balance but is able to recover and continue walking.    Gait with Horizontal Head Turns  Performs head turns with moderate changes in gait velocity, slows down, deviates 10-15 in outside 12 in walkway width but recovers, can continue to walk.    Gait with Vertical  Head Turns  Performs task with slight change in gait velocity (eg, minor disruption to smooth gait path), deviates 6 - 10 in outside 12 in walkway width or uses assistive device    Gait and Pivot Turn  Pivot turns safely in greater than 3 sec and stops with no loss of balance, or pivot turns safely within 3 sec and stops with mild imbalance, requires small steps to catch balance.    Step Over Obstacle  Is able to step over one shoe box (4.5 in total height) but must slow down and adjust steps to clear box safely. May require verbal cueing.    Gait with Narrow Base of Support  Ambulates less than 4 steps heel to toe or cannot perform without assistance.    Gait with Eyes Closed  Walks 20 ft, slow speed, abnormal gait pattern, evidence for imbalance, deviates 10-15 in outside 12 in walkway width. Requires more than 9 sec to ambulate 20 ft.    Ambulating Backwards  Walks 20 ft, uses assistive device, slower speed, mild gait deviations, deviates 6-10 in outside 12 in walkway width.    Steps  Two feet to a stair, must use rail.    Total Score  12    FGA comment:  12/30 high falls risk         Gaze Stabilization - Tip Card  1.Target must remain in focus, not blurry, and appear stationary while head is in motion. 2.Perform exercises with small head movements (45 to either side of midline). 3.Increase speed of head motion so long as target is in focus. 4.If you wear eyeglasses, be sure you can see target through lens (therapist will give specific instructions for bifocal / progressive lenses). 5.These exercises may provoke dizziness or nausea. Work through these symptoms. If too dizzy, slow head movement slightly. Rest between each exercise. 6.Exercises demand concentration; avoid distractions. 7.For safety, perform standing exercises close to a counter, wall, corner, or next to someone.  Copyright  VHI. All rights reserved.   Gaze Stabilization - Standing Feet Apart   Stand with your back  against a wall and hands on the back of a chair for support.  Feet shoulder width apart, keeping eyes on target on wall 3 feet away, tilt head down slightly and move head side to side for 30 seconds. Repeat while moving head up and down for 30 seconds.  Do 2-3 sessions per day.   SIT TO STAND: Pillow    Feet: shoulder-width on pillow. Lean chest forward. Raise hips and straighten knees to stand. _12__ reps per  set, _2__ sets per day, __5_ days per week. Have chair or counter in front for support if needed.   Foot: Heel Raises    In sitting, keep toes on the ground, press through toes and lift heels up in the air.  Bring back down and repeat 12 times.    Resisted Toe Raises   Setup Begin sitting upright holding one end of a resistance band anchored under one foot and looped around the right.  Bend your foot upward, pulling against the resistance. Hold briefly then slowly lower your foot back down and repeat 12 times. Make sure to keep both heels on the ground during the exercise.   Resisted Ankle Turn Out   Setup Begin sitting in an upright position with a resistance loop around both feet, flat on the ground.  Lift your Right toes up and to the Right against the resistance of the band.  Try to keep your knee straight.  Repeat 12 times on the Right side    PT Education - 03/15/19 1602    Education Details  results of FGA and gait velocity; initiated new balance, strength, vestibular HEP    Person(s) Educated  Patient    Methods  Explanation;Demonstration;Handout    Comprehension  Verbalized understanding;Returned demonstration       PT Short Term Goals - 03/15/19 1608      PT SHORT TERM GOAL #1   Title  Pt will participate in further balance assessment with gait velocity and FGA - baselines TBD    Time  4    Period  Weeks    Status  Achieved    Target Date  04/04/19      PT SHORT TERM GOAL #2   Title  Pt will demonstrate independence with initial  vestibular/endurance/strengthening/balance HEP    Time  4    Period  Weeks    Status  New    Target Date  05/04/19      PT SHORT TERM GOAL #3   Title  Pt will demonstrate decreased motion sensitivity to looking up/down and bending down to the ground as indicated by decrease in MSQ to 1/5    Baseline  2/5    Time  4    Period  Weeks    Status  New    Target Date  04/04/19      PT SHORT TERM GOAL #4   Title  Pt will demonstrate improved standing/activity tolerance to be able to perform 10 minutes of ADL/house chores or exercise in standing    Time  4    Period  Weeks    Status  New    Target Date  04/04/19      PT SHORT TERM GOAL #5   Title  Patient will ambulate x 500' outside over pavement with supervision and no LOB with visual scanning/head turns    Time  4    Period  Weeks    Status  New    Target Date  04/04/19        PT Long Term Goals - 03/15/19 1608      PT LONG TERM GOAL #1   Title  Pt will demonstrate independence with final HEP and will begin walking around neighborhood    Time  8    Period  Weeks    Status  New      PT LONG TERM GOAL #2   Title  Pt will demonstrate no motion sensitivity to vertical head movements and  bending down to the ground    Time  8    Period  Weeks    Status  New      PT LONG TERM GOAL #3   Title  Pt will improve FGA by 4 points to indicate decreased falls risk in community    Baseline  12/30    Time  8    Period  Weeks    Status  New      PT LONG TERM GOAL #4   Title  Pt gait velocity will increase to >/= 2.62 ft/sec    Baseline  2.4    Time  8    Period  Weeks    Status  New      PT LONG TERM GOAL #5   Title  Pt will ambulate x 1000' outside over uneven pavement MOD I with no LOB during visual scanning, head turns and L/R turns    Time  8    Period  Weeks    Status  New      PT LONG TERM GOAL #6   Title  Pt will decrease DHI by 18 points and will improve overall function on FOTO to >= 74%    Baseline  44 DHI, 52%  function FOTO    Time  8    Period  Weeks    Status  New            Plan - 03/15/19 1604    Clinical Impression Statement  Continued balance and falls risk assessment with FGA and gait velocity; pt demonstrates decline in balance during gait as indicated by FGA score of 12 and gait velocity at 2.4 ft/sec.  Initiated new HEP with focus on VOR training, balance and LE strengthening and ankle strengthening on R.  Pt tolerated well.  Will continue to progress towards LTG.    Personal Factors and Comorbidities  Age;Comorbidity 3+;Finances;Past/Current Experience;Transportation    Comorbidities  DVT, TIA, anemia, arthritis, osteopenia, macular degeneration, dizziness and falls    Examination-Activity Limitations  Bend;Locomotion Level;Reach Overhead;Stand    Examination-Participation Restrictions  Cleaning;Community Activity;Driving;Laundry;Meal Prep    Stability/Clinical Decision Making  Evolving/Moderate complexity    Rehab Potential  Good    PT Frequency  Other (comment)   2x/week x 2; 1x/week x 6   PT Duration  8 weeks    PT Treatment/Interventions  ADLs/Self Care Home Management;DME Instruction;Gait training;Stair training;Functional mobility training;Therapeutic activities;Therapeutic exercise;Balance training;Neuromuscular re-education;Patient/family education;Vestibular;Cryotherapy;Moist Heat;Orthotic Fit/Training;Manual techniques;Passive range of motion    PT Next Visit Plan  Monitor BP each visit.  Continue to progress x1 viewing, add corner balance to HEP.  Ankle strengthening and balance training, compliant surfaces, gait with head turns, narrow BOS    Consulted and Agree with Plan of Care  Patient       Patient will benefit from skilled therapeutic intervention in order to improve the following deficits and impairments:  Decreased balance, Decreased endurance, Decreased range of motion, Decreased strength, Difficulty walking, Dizziness, Pain  Visit Diagnosis: Dizziness and  giddiness  Unsteadiness on feet  Difficulty in walking, not elsewhere classified  Muscle weakness (generalized)  Pain in right ankle and joints of right foot     Problem List Patient Active Problem List   Diagnosis Date Noted  . Dizziness 02/14/2019  . Chest pain 02/14/2019  . DVT, lower extremity, distal, acute, left ([place]) 12/24/2018  . Palpitations 05/30/2018  . Irregular heartbeat 03/02/2018  . TIA (transient ischemic attack) 02/15/2018  . Urinary, incontinence, stress  female 06/16/2016  . Right carotid bruit 12/23/2014  . Exertional dyspnea 12/24/2013  . Bilateral arm numbness and tingling while sleeping - and shortly after waking 12/24/2013  . Poor balance 12/24/2013  . Edema of left lower extremity 12/21/2012  . Essential hypertension; borderline   . H/O hypercholesterolemia   . Osteoporosis - of the spine 09/22/2011   Rico Junker, PT, DPT 03/15/19    4:10 PM    [facility] [address] [place], [place], [address] Phone: [phone]   Fax:  [phone]  Name: KAYDYNCE LUC MRN: [id number] Date of Birth: Dec 20, 1927

## 2019-03-21 ENCOUNTER — Ambulatory Visit: Payer: Medicare PPO | Attending: Internal Medicine | Admitting: Physical Therapy

## 2019-03-21 ENCOUNTER — Encounter: Payer: Self-pay | Admitting: Physical Therapy

## 2019-03-21 ENCOUNTER — Other Ambulatory Visit: Payer: Self-pay

## 2019-03-21 VITALS — BP 120/60

## 2019-03-21 DIAGNOSIS — R296 Repeated falls: Secondary | ICD-10-CM | POA: Diagnosis present

## 2019-03-21 DIAGNOSIS — R262 Difficulty in walking, not elsewhere classified: Secondary | ICD-10-CM

## 2019-03-21 DIAGNOSIS — R42 Dizziness and giddiness: Secondary | ICD-10-CM | POA: Insufficient documentation

## 2019-03-21 DIAGNOSIS — R2681 Unsteadiness on feet: Secondary | ICD-10-CM | POA: Diagnosis present

## 2019-03-21 DIAGNOSIS — M6281 Muscle weakness (generalized): Secondary | ICD-10-CM | POA: Insufficient documentation

## 2019-03-21 DIAGNOSIS — M25571 Pain in right ankle and joints of right foot: Secondary | ICD-10-CM | POA: Diagnosis present

## 2019-03-21 NOTE — Patient Instructions (Addendum)
Gaze Stabilization - Tip Card  1.Target must remain in focus, not blurry, and appear stationary while head is in motion. 2.Perform exercises with small head movements (45 to either side of midline). 3.Increase speed of head motion so long as target is in focus. 4.If you wear eyeglasses, be sure you can see target through lens (therapist will give specific instructions for bifocal / progressive lenses). 5.These exercises may provoke dizziness or nausea. Work through these symptoms. If too dizzy, slow head movement slightly. Rest between each exercise. 6.Exercises demand concentration; avoid distractions. 7.For safety, perform standing exercises close to a counter, wall, corner, or next to someone.  Copyright  VHI. All rights reserved.   Gaze Stabilization - Standing Feet Apart   Stand with your feet apart and hands on the back of a chair for support.  Keeping eyes on target on wall 3 feet away, tilt head down slightly and move head side to side for 60 seconds. Repeat while moving head up and down for 60 seconds.  Do 2-3 sessions per day.   SIT TO STAND: Pillow    Feet: shoulder-width on pillow. Lean chest forward. Raise hips and straighten knees to stand. _12__ reps per set, _2__ sets per day, __5_ days per week. Have chair or counter in front for support if needed.   Foot: Heel Raises    In sitting, keep toes on the ground, press through toes and lift heels up in the air.  Bring back down and repeat 12 times.    Resisted Toe Raises   Setup Begin sitting upright holding one end of a resistance band anchored under one foot and looped around the right.  Bend your foot upward, pulling against the resistance. Hold briefly then slowly lower your foot back down and repeat 12 times. Make sure to keep both heels on the ground during the exercise.   Resisted Ankle Turn Out   Setup Begin sitting in an upright position with a resistance loop around both feet, flat on the  ground.  Lift your Right toes up and to the Right against the resistance of the band.  Try to keep your knee straight.  Repeat 12 times on the Right side

## 2019-03-21 NOTE — Therapy (Signed)
[facility] [address] [place], [place], [address] Phone: [phone]   Fax:  [phone]  Physical Therapy Treatment  Patient Details  Name: [name] MRN: [id number] Date of Birth: 02-23-1927 Referring Provider (PT): Referred by hospital physician - Georgette Shell, MD; will send cert to PCP Lavone Orn, MD   Encounter Date: 03/21/2019  PT End of Session - 03/21/19 1620    Visit Number  3    Number of Visits  11    Date for PT Re-Evaluation  05/04/19    Authorization Type  [employer]; $20 copay    PT Start Time  1533    PT Stop Time  1616    PT Time Calculation (min)  43 min    Activity Tolerance  Patient tolerated treatment well    Behavior During Therapy  [facility] for tasks assessed/performed       Past Medical History:  Diagnosis Date  . Abnormal Pap smear 1975-76  . Anemia    history of  . Arthritis    spine  . Breast cyst, left 1980  . Cystocele 2012  . Diverticulosis    with h/o Diverticulitis  . GERD (gastroesophageal reflux disease)   . H/O hypercholesterolemia   . H/O osteopenia   . H/O varicella   . H/O varicose veins   . Hx of Mumps   . Macular degeneration   . Seasonal allergies   . Urge incontinence 2012  . Urinary frequency 2010    Past Surgical History:  Procedure Laterality Date  . ABDOMINAL HYSTERECTOMY    . BLADDER SUSPENSION N/A 06/16/2016   Procedure: TRANSVAGINAL TAPE (TVT) PROCEDURE;  Surgeon: Everett Graff, MD;  Location: [facility];  Service: Gynecology;  Laterality: N/A;  . BREAST CYST ASPIRATION  1964  . COLONOSCOPY     Dr. Earlean Shawl  . CYSTOCELE REPAIR N/A 06/16/2016   Procedure: ANTERIOR REPAIR (CYSTOCELE);  Surgeon: Everett Graff, MD;  Location: [facility];  Service: Gynecology;  Laterality: N/A;  . CYSTOSCOPY N/A 06/16/2016   Procedure: CYSTOSCOPY;  Surgeon: Everett Graff, MD;  Location: [facility];  Service: Gynecology;  Laterality: N/A;  see anterior repair  . Lower  Extremity Venous Dopplers  01/09/2012   Right and left lower steroids: No evidence of thrombus or, thrombophlebitis; right and left GSV and SSV: No venous insufficiency. Normal exam.  . NM MYOVIEW LTD  11/2016   6.6 METS.  Reached 106% of max.  Heart rate.  Walk for 4:40 min.  EF 72%.  Normal blood pressure response.  upsloping ST segment depression, nonspecific.  Otherwise normal study.  LOW RISK.  No evidence of ischemia or infarction.  . TRANSTHORACIC ECHOCARDIOGRAM  01/2018   EF > 65%. LV size small. Gr 1 DD (normal for age).   Normal atrial size. Moderate Aortic Sclerosis without Stenosis. Moderate Mitral Annular Calcification with mild MR & no MS    Vitals:   03/21/19 1540  BP: 120/60    Subjective Assessment - 03/21/19 1537    Subjective  Pt reporting she is moving slow today.  Having some ongoing ankle pain at night when some thing touches it.  Still fatigued.  Dizziness is okay, still wobbly.    Pertinent History  DVT, TIA, anemia, arthritis, osteopenia, macular degeneration, dizziness and falls    Diagnostic tests  CT scan    Patient Stated Goals  To get rid of the dizziness    Currently in Pain?  Yes  Vestibular Treatment/Exercise - 03/21/19 1545      Vestibular Treatment/Exercise   Vestibular Treatment Provided  Gaze    Gaze Exercises  X1 Viewing Horizontal;X1 Viewing Vertical      X1 Viewing Horizontal   Foot Position  standing feet apart, with hand holding chair    Reps  2    Comments  30 seconds > 60 seconds      X1 Viewing Vertical   Foot Position  standing feet apart, with hand holding chair    Reps  2    Comments  30 seconds > 60 seconds         Balance Exercises - 03/21/19 1556      Balance Exercises: Standing   Wall Bumps  Hip;Eyes opened;10 reps   ankle/hip, solid surface, compliant, min A   Sit to Stand  Standard surface;Without upper extremity support;Foam/compliant surface;Other (comment)   and solid  surface, 10 reps each   Other Standing Exercises  Weight shifting forwards/back, laterally at corner x 10 reps each without UE support but with therapist providing supervision and cues to lead with hips/pelvis and keep head over BOS.        PT Education - 03/21/19 1619    Education Details  progressed x1 to 60 seconds in standing    Person(s) Educated  Patient    Methods  Explanation    Comprehension  Verbalized understanding;Returned demonstration       PT Short Term Goals - 03/15/19 1608      PT SHORT TERM GOAL #1   Title  Pt will participate in further balance assessment with gait velocity and FGA - baselines TBD    Time  4    Period  Weeks    Status  Achieved    Target Date  04/04/19      PT SHORT TERM GOAL #2   Title  Pt will demonstrate independence with initial vestibular/endurance/strengthening/balance HEP    Time  4    Period  Weeks    Status  New    Target Date  05/04/19      PT SHORT TERM GOAL #3   Title  Pt will demonstrate decreased motion sensitivity to looking up/down and bending down to the ground as indicated by decrease in MSQ to 1/5    Baseline  2/5    Time  4    Period  Weeks    Status  New    Target Date  04/04/19      PT SHORT TERM GOAL #4   Title  Pt will demonstrate improved standing/activity tolerance to be able to perform 10 minutes of ADL/house chores or exercise in standing    Time  4    Period  Weeks    Status  New    Target Date  04/04/19      PT SHORT TERM GOAL #5   Title  Patient will ambulate x 500' outside over pavement with supervision and no LOB with visual scanning/head turns    Time  4    Period  Weeks    Status  New    Target Date  04/04/19        PT Long Term Goals - 03/15/19 1608      PT LONG TERM GOAL #1   Title  Pt will demonstrate independence with final HEP and will begin walking around neighborhood    Time  8    Period  Weeks    Status  New  PT LONG TERM GOAL #2   Title  Pt will demonstrate no motion  sensitivity to vertical head movements and bending down to the ground    Time  8    Period  Weeks    Status  New      PT LONG TERM GOAL #3   Title  Pt will improve FGA by 4 points to indicate decreased falls risk in community    Baseline  12/30    Time  8    Period  Weeks    Status  New      PT LONG TERM GOAL #4   Title  Pt gait velocity will increase to >/= 2.62 ft/sec    Baseline  2.4    Time  8    Period  Weeks    Status  New      PT LONG TERM GOAL #5   Title  Pt will ambulate x 1000' outside over uneven pavement MOD I with no LOB during visual scanning, head turns and L/R turns    Time  8    Period  Weeks    Status  New      PT LONG TERM GOAL #6   Title  Pt will decrease DHI by 18 points and will improve overall function on FOTO to >= 74%    Baseline  44 DHI, 52% function FOTO    Time  8    Period  Weeks    Status  New            Plan - 03/21/19 1621    Clinical Impression Statement  Pt able to progress x1 viewing to 60 seconds today due to progress with gaze adaptation, still requires UE support.  Continued to focus on weight shifting and balance strategies on both solid surfaces and compliant surfaces; performed both ankle and hip strategies.  More assistance required to prevent posterior LOB on compliant surfaces.  Pt fatigued quickly and required intermittent sitting rest breaks due to SOB.  Will continue to addrss and progress as pt is able to tolerate towards LTG.    Personal Factors and Comorbidities  Age;Comorbidity 3+;Finances;Past/Current Experience;Transportation    Comorbidities  DVT, TIA, anemia, arthritis, osteopenia, macular degeneration, dizziness and falls    Examination-Activity Limitations  Bend;Locomotion Level;Reach Overhead;Stand    Examination-Participation Restrictions  Cleaning;Community Activity;Driving;Laundry;Meal Prep    Stability/Clinical Decision Making  Evolving/Moderate complexity    Rehab Potential  Good    PT Frequency  Other  (comment)   2x/week x 2; 1x/week x 6   PT Duration  8 weeks    PT Treatment/Interventions  ADLs/Self Care Home Management;DME Instruction;Gait training;Stair training;Functional mobility training;Therapeutic activities;Therapeutic exercise;Balance training;Neuromuscular re-education;Patient/family education;Vestibular;Cryotherapy;Moist Heat;Orthotic Fit/Training;Manual techniques;Passive range of motion    PT Next Visit Plan  Monitor BP each visit.  Continue to progress x1 viewing - working on removing UE support; balance strategies on compliant, add corner balance to HEP.  Ankle strengthening and balance training, compliant surfaces, gait with head turns, narrow BOS    Consulted and Agree with Plan of Care  Patient       Patient will benefit from skilled therapeutic intervention in order to improve the following deficits and impairments:  Decreased balance, Decreased endurance, Decreased range of motion, Decreased strength, Difficulty walking, Dizziness, Pain  Visit Diagnosis: Dizziness and giddiness  Unsteadiness on feet  Difficulty in walking, not elsewhere classified  Muscle weakness (generalized)  Pain in right ankle and joints of right foot  Repeated falls  Problem List Patient Active Problem List   Diagnosis Date Noted  . Dizziness 02/14/2019  . Chest pain 02/14/2019  . DVT, lower extremity, distal, acute, left ([place]) 12/24/2018  . Palpitations 05/30/2018  . Irregular heartbeat 03/02/2018  . TIA (transient ischemic attack) 02/15/2018  . Urinary, incontinence, stress female 06/16/2016  . Right carotid bruit 12/23/2014  . Exertional dyspnea 12/24/2013  . Bilateral arm numbness and tingling while sleeping - and shortly after waking 12/24/2013  . Poor balance 12/24/2013  . Edema of left lower extremity 12/21/2012  . Essential hypertension; borderline   . H/O hypercholesterolemia   . Osteoporosis - of the spine 09/22/2011    Rico Junker, PT, DPT 03/21/19    4:25  PM    [facility] [address] [place], [place], [address] Phone: [phone]   Fax:  [phone]  Name: [name] MRN: [id number] Date of Birth: [birth year]

## 2019-03-25 ENCOUNTER — Other Ambulatory Visit: Payer: Self-pay

## 2019-03-25 ENCOUNTER — Ambulatory Visit: Payer: Medicare PPO | Admitting: Physical Therapy

## 2019-03-25 DIAGNOSIS — M25571 Pain in right ankle and joints of right foot: Secondary | ICD-10-CM

## 2019-03-25 DIAGNOSIS — R42 Dizziness and giddiness: Secondary | ICD-10-CM

## 2019-03-25 DIAGNOSIS — R262 Difficulty in walking, not elsewhere classified: Secondary | ICD-10-CM

## 2019-03-25 DIAGNOSIS — R296 Repeated falls: Secondary | ICD-10-CM

## 2019-03-25 DIAGNOSIS — M6281 Muscle weakness (generalized): Secondary | ICD-10-CM

## 2019-03-25 DIAGNOSIS — R2681 Unsteadiness on feet: Secondary | ICD-10-CM

## 2019-03-25 NOTE — Patient Instructions (Addendum)
SIT TO STAND: Pillow    Feet: shoulder-width on pillow. Lean chest forward. Raise hips and straighten knees to stand. _12__ reps per set, _2__ sets per day, __5_ days per week. Have chair or counter in front for support if needed.   Foot: Heel Raises    In sitting, keep toes on the ground, press through toes and lift heels up in the air.  Bring back down and repeat 12 times.    Resisted Toe Raises   Setup Begin sitting upright holding one end of a resistance band anchored under one foot and looped around the right.  Bend your foot upward, pulling against the resistance. Hold briefly then slowly lower your foot back down and repeat 12 times. Make sure to keep both heels on the ground during the exercise.   Resisted Ankle Turn Out   Setup Begin sitting in an upright position with a resistance loop around both feet, flat on the ground.  Lift your Right toes up and to the Right against the resistance of the band.  Try to keep your knee straight.  Repeat 12 times on the Right side Gaze Stabilization: Standing Feet Together    Feet together, keeping eyes on target on wall __3__ feet away, tilt head down slightly and move head side to side for __60__ seconds - touch a chair in the front for support if needed.  Repeat while moving head up and down for __60__ seconds. Do _2_ sessions per day.   Single Leg - Eyes Open    Holding support with ONE HAND, lift right leg while maintaining balance over left leg.  Hold 10 seconds. Switch feet and repeat for 10 seconds, standing tall. Repeat __2__ times per leg, per session. Do __2__ sessions per day.   Feet Heel-Toe "Tandem"    HOLDING YOUR COUNTER TOP WITH ONE HAND - walk a straight line bringing one foot directly in front of the other, SLOWLY - PAUSE AFTER EACH STEP Repeat for 4 LAPS, DOWN AND BACK EACH session. Do __2__ sessions per day.

## 2019-03-25 NOTE — Therapy (Signed)
[facility] [address] [place], [place], [address] Phone: [phone]   Fax:  [phone]  Physical Therapy Treatment  Patient Details  Name: [name] MRN: [id number] Date of Birth: November 08, 1927 Referring Provider (PT): Referred by hospital physician - Georgette Shell, MD; will send cert to PCP Lavone Orn, MD   Encounter Date: 03/25/2019  PT End of Session - 03/25/19 1605    Visit Number  4    Number of Visits  11    Date for PT Re-Evaluation  05/04/19    Authorization Type  [employer]; $20 copay    PT Start Time  1148    PT Stop Time  1234    PT Time Calculation (min)  46 min    Activity Tolerance  Patient tolerated treatment well    Behavior During Therapy  [facility] for tasks assessed/performed       Past Medical History:  Diagnosis Date  . Abnormal Pap smear 1975-76  . Anemia    history of  . Arthritis    spine  . Breast cyst, left 1980  . Cystocele 2012  . Diverticulosis    with h/o Diverticulitis  . GERD (gastroesophageal reflux disease)   . H/O hypercholesterolemia   . H/O osteopenia   . H/O varicella   . H/O varicose veins   . Hx of Mumps   . Macular degeneration   . Seasonal allergies   . Urge incontinence 2012  . Urinary frequency 2010    Past Surgical History:  Procedure Laterality Date  . ABDOMINAL HYSTERECTOMY    . BLADDER SUSPENSION N/A 06/16/2016   Procedure: TRANSVAGINAL TAPE (TVT) PROCEDURE;  Surgeon: Everett Graff, MD;  Location: [facility];  Service: Gynecology;  Laterality: N/A;  . BREAST CYST ASPIRATION  1964  . COLONOSCOPY     Dr. Earlean Shawl  . CYSTOCELE REPAIR N/A 06/16/2016   Procedure: ANTERIOR REPAIR (CYSTOCELE);  Surgeon: Everett Graff, MD;  Location: [facility];  Service: Gynecology;  Laterality: N/A;  . CYSTOSCOPY N/A 06/16/2016   Procedure: CYSTOSCOPY;  Surgeon: Everett Graff, MD;  Location: [facility];  Service: Gynecology;  Laterality: N/A;  see anterior repair  . Lower  Extremity Venous Dopplers  01/09/2012   Right and left lower steroids: No evidence of thrombus or, thrombophlebitis; right and left GSV and SSV: No venous insufficiency. Normal exam.  . NM MYOVIEW LTD  11/2016   6.6 METS.  Reached 106% of max.  Heart rate.  Walk for 4:40 min.  EF 72%.  Normal blood pressure response.  upsloping ST segment depression, nonspecific.  Otherwise normal study.  LOW RISK.  No evidence of ischemia or infarction.  . TRANSTHORACIC ECHOCARDIOGRAM  01/2018   EF > 65%. LV size small. Gr 1 DD (normal for age).   Normal atrial size. Moderate Aortic Sclerosis without Stenosis. Moderate Mitral Annular Calcification with mild MR & no MS    There were no vitals filed for this visit.  Subjective Assessment - 03/25/19 1152    Subjective  Practiced exercises over the weekend.  Pt still having some ankle pain.  Goes for LE doppler tomorrow to check for DVT.    Pertinent History  DVT, TIA, anemia, arthritis, osteopenia, macular degeneration, dizziness and falls    Diagnostic tests  CT scan    Patient Stated Goals  To get rid of the dizziness    Currently in Pain?  Yes  [facility] Adult PT Treatment/Exercise - 03/25/19 1153      Exercises   Exercises  Knee/Hip      Knee/Hip Exercises: Aerobic   Nustep  Level 6 resistance x 8 minutes with bilat UE and LE for aerobic conditioning at moderate intensity.  HR at 4 minutes: 77, 100% sp02.  At end of 8 minutes: 87 bpm, 98% sp02      Vestibular Treatment/Exercise - 03/25/19 1207      Vestibular Treatment/Exercise   Vestibular Treatment Provided  Gaze    Gaze Exercises  X1 Viewing Horizontal;X1 Viewing Vertical      X1 Viewing Horizontal   Foot Position  standing feet apart, no UE support; feet together with UE support    Reps  2    Comments  60 seconds      X1 Viewing Vertical   Foot Position  standing feet apart, no UE support; feet together without UE support    Reps  2    Comments  60  seconds       SIT TO STAND: Pillow    Feet: shoulder-width on pillow. Lean chest forward. Raise hips and straighten knees to stand. _12__ reps per set, _2__ sets per day, __5_ days per week. Have chair or counter in front for support if needed.   Foot: Heel Raises    In sitting, keep toes on the ground, press through toes and lift heels up in the air.  Bring back down and repeat 12 times.    Resisted Toe Raises   Setup Begin sitting upright holding one end of a resistance band anchored under one foot and looped around the right.  Bend your foot upward, pulling against the resistance. Hold briefly then slowly lower your foot back down and repeat 12 times. Make sure to keep both heels on the ground during the exercise.   Resisted Ankle Turn Out   Setup Begin sitting in an upright position with a resistance loop around both feet, flat on the ground.  Lift your Right toes up and to the Right against the resistance of the band.  Try to keep your knee straight.  Repeat 12 times on the Right side Gaze Stabilization: Standing Feet Together    Feet together, keeping eyes on target on wall __3__ feet away, tilt head down slightly and move head side to side for __60__ seconds - touch a chair in the front for support if needed.  Repeat while moving head up and down for __60__ seconds. Do _2_ sessions per day.   Single Leg - Eyes Open    Holding support with ONE HAND, lift right leg while maintaining balance over left leg.  Hold 10 seconds. Switch feet and repeat for 10 seconds, standing tall. Repeat __2__ times per leg, per session. Do __2__ sessions per day.   Feet Heel-Toe "Tandem"    HOLDING YOUR COUNTER TOP WITH ONE HAND - walk a straight line bringing one foot directly in front of the other, SLOWLY - PAUSE AFTER EACH STEP Repeat for 4 LAPS, DOWN AND BACK EACH session. Do __2__ sessions per day.      PT Education - 03/25/19 1605    Education Details   updated HEP    Person(s) Educated  Patient    Methods  Explanation;Handout;Demonstration    Comprehension  Verbalized understanding;Returned demonstration       PT Short Term Goals - 03/15/19 1608      PT SHORT TERM GOAL #1   Title  Pt will participate in further balance assessment with gait velocity and FGA - baselines TBD    Time  4    Period  Weeks    Status  Achieved    Target Date  04/04/19      PT SHORT TERM GOAL #2   Title  Pt will demonstrate independence with initial vestibular/endurance/strengthening/balance HEP    Time  4    Period  Weeks    Status  New    Target Date  05/04/19      PT SHORT TERM GOAL #3   Title  Pt will demonstrate decreased motion sensitivity to looking up/down and bending down to the ground as indicated by decrease in MSQ to 1/5    Baseline  2/5    Time  4    Period  Weeks    Status  New    Target Date  04/04/19      PT SHORT TERM GOAL #4   Title  Pt will demonstrate improved standing/activity tolerance to be able to perform 10 minutes of ADL/house chores or exercise in standing    Time  4    Period  Weeks    Status  New    Target Date  04/04/19      PT SHORT TERM GOAL #5   Title  Patient will ambulate x 500' outside over pavement with supervision and no LOB with visual scanning/head turns    Time  4    Period  Weeks    Status  New    Target Date  04/04/19        PT Long Term Goals - 03/15/19 1608      PT LONG TERM GOAL #1   Title  Pt will demonstrate independence with final HEP and will begin walking around neighborhood    Time  8    Period  Weeks    Status  New      PT LONG TERM GOAL #2   Title  Pt will demonstrate no motion sensitivity to vertical head movements and bending down to the ground    Time  8    Period  Weeks    Status  New      PT LONG TERM GOAL #3   Title  Pt will improve FGA by 4 points to indicate decreased falls risk in community    Baseline  12/30    Time  8    Period  Weeks    Status  New      PT  LONG TERM GOAL #4   Title  Pt gait velocity will increase to >/= 2.62 ft/sec    Baseline  2.4    Time  8    Period  Weeks    Status  New      PT LONG TERM GOAL #5   Title  Pt will ambulate x 1000' outside over uneven pavement MOD I with no LOB during visual scanning, head turns and L/R turns    Time  8    Period  Weeks    Status  New      PT LONG TERM GOAL #6   Title  Pt will decrease DHI by 18 points and will improve overall function on FOTO to >= 74%    Baseline  44 DHI, 52% function FOTO    Time  8    Period  Weeks    Status  New  Plan - 03/25/19 1606    Clinical Impression Statement  Initiated and began to incorporate aerobic conditioning into therapy session with Nustep - pt able to start with 8 minutes; will continue to gradually progress.  Pt able to progress x1 viewing by increasing time and narrowing BOS indicating progress with balance.  Continued to progress HEP by adding SLS and tandem gait for balance and ankle strengthening.  No ankle pain reported during exercises.  Will continue to progress towards LTG.    Personal Factors and Comorbidities  Age;Comorbidity 3+;Finances;Past/Current Experience;Transportation    Comorbidities  DVT, TIA, anemia, arthritis, osteopenia, macular degeneration, dizziness and falls    Examination-Activity Limitations  Bend;Locomotion Level;Reach Overhead;Stand    Examination-Participation Restrictions  Cleaning;Community Activity;Driving;Laundry;Meal Prep    Stability/Clinical Decision Making  Evolving/Moderate complexity    Rehab Potential  Good    PT Frequency  Other (comment)   2x/week x 2; 1x/week x 6   PT Duration  8 weeks    PT Treatment/Interventions  ADLs/Self Care Home Management;DME Instruction;Gait training;Stair training;Functional mobility training;Therapeutic activities;Therapeutic exercise;Balance training;Neuromuscular re-education;Patient/family education;Vestibular;Cryotherapy;Moist Heat;Orthotic  Fit/Training;Manual techniques;Passive range of motion    PT Next Visit Plan  Monitor BP each visit.  Start on Nustep each visit- started at 8 min, level 6 resistance for aerobic conditioning.  Continue to progress x1 viewing - currently feet together - add compliant?  Ankle strengthening and balance training, compliant surfaces, gait with head turns, narrow BOS    Consulted and Agree with Plan of Care  Patient       Patient will benefit from skilled therapeutic intervention in order to improve the following deficits and impairments:  Decreased balance, Decreased endurance, Decreased range of motion, Decreased strength, Difficulty walking, Dizziness, Pain  Visit Diagnosis: Dizziness and giddiness  Unsteadiness on feet  Difficulty in walking, not elsewhere classified  Muscle weakness (generalized)  Pain in right ankle and joints of right foot  Repeated falls     Problem List Patient Active Problem List   Diagnosis Date Noted  . Dizziness 02/14/2019  . Chest pain 02/14/2019  . DVT, lower extremity, distal, acute, left ([place]) 12/24/2018  . Palpitations 05/30/2018  . Irregular heartbeat 03/02/2018  . TIA (transient ischemic attack) 02/15/2018  . Urinary, incontinence, stress female 06/16/2016  . Right carotid bruit 12/23/2014  . Exertional dyspnea 12/24/2013  . Bilateral arm numbness and tingling while sleeping - and shortly after waking 12/24/2013  . Poor balance 12/24/2013  . Edema of left lower extremity 12/21/2012  . Essential hypertension; borderline   . H/O hypercholesterolemia   . Osteoporosis - of the spine 09/22/2011    Rico Junker, PT, DPT 03/25/19    4:10 PM    [facility] [address] [place], [place], [address] Phone: [phone]   Fax:  [phone]  Name: [name] MRN: [id number] Date of Birth: [birth year]

## 2019-03-26 ENCOUNTER — Ambulatory Visit (HOSPITAL_COMMUNITY)
Admission: RE | Admit: 2019-03-26 | Discharge: 2019-03-26 | Disposition: A | Discharge status: Home / Self Care | Payer: Medicare PPO | Source: Ambulatory Visit | Attending: Internal Medicine | Admitting: Internal Medicine

## 2019-03-26 DIAGNOSIS — I824Z2 Acute embolism and thrombosis of unspecified deep veins of left distal lower extremity: Secondary | ICD-10-CM | POA: Diagnosis present

## 2019-03-26 DIAGNOSIS — R600 Localized edema: Secondary | ICD-10-CM | POA: Diagnosis not present

## 2019-03-27 ENCOUNTER — Telehealth: Payer: Self-pay | Admitting: *Deleted

## 2019-03-27 ENCOUNTER — Encounter: Payer: Self-pay | Admitting: Cardiology

## 2019-03-27 ENCOUNTER — Telehealth (INDEPENDENT_AMBULATORY_CARE_PROVIDER_SITE_OTHER): Payer: Medicare PPO | Admitting: Cardiology

## 2019-03-27 VITALS — Ht 63.0 in

## 2019-03-27 DIAGNOSIS — R0600 Dyspnea, unspecified: Secondary | ICD-10-CM

## 2019-03-27 DIAGNOSIS — I825Z2 Chronic embolism and thrombosis of unspecified deep veins of left distal lower extremity: Secondary | ICD-10-CM | POA: Diagnosis not present

## 2019-03-27 DIAGNOSIS — R0609 Other forms of dyspnea: Secondary | ICD-10-CM

## 2019-03-27 DIAGNOSIS — I10 Essential (primary) hypertension: Secondary | ICD-10-CM

## 2019-03-27 MED ORDER — ASPIRIN EC 81 MG PO TBEC: 81.0000 mg | DELAYED_RELEASE_TABLET | Freq: Every day | ORAL | 3 refills | Status: DC

## 2019-03-27 NOTE — Assessment & Plan Note (Signed)
Interestingly, her Dopplers back in December showed evidence of DVT.  She has been on Eliquis now (and despite this, there was concern for possible PE when she had her Covid infection which would be very unlikely)  Swelling has improved, but still mild edema noted. Continue to recommend foot elevation and support stockings.  Follow-up Doppler showed resolution of DVT.  Instructions would be to complete current bottle of Eliquis and then stop. I really do not see any reason for her to restart aspirin.

## 2019-03-27 NOTE — Patient Instructions (Addendum)
Medication Instructions:   Stop taking Eliquis after the current bottle complete.  Okay to not restart  Enteric coated aspirin 81 mg   *If you need a refill on your cardiac medications before your next appointment, please call your pharmacy*   Lab Work: None     Testing/Procedures: None   Follow-Up: At [facility], you and your health needs are our priority.  As part of our continuing mission to provide you with exceptional heart care, we have created designated Provider Care Teams.  These Care Teams include your primary Cardiologist (physician) and Advanced Practice Providers (APPs -  Physician Assistants and Nurse Practitioners) who all work together to provide you with the care you need, when you need it.  We recommend signing up for the patient portal called "MyChart".  Sign up information is provided on this After Visit Summary.  MyChart is used to connect with patients for Virtual Visits (Telemedicine).  Patients are able to view lab/test results, encounter notes, upcoming appointments, etc.  Non-urgent messages can be sent to your provider as well.   To learn more about what you can do with MyChart, go to [email or web address].    Your next appointment:   5 month(s)  The format for your next appointment:   In Person  Provider:   Glenetta Hew, MD   Other Instructions Keep building up your energy level and exercise so he can get back to your baseline.  We are recommending the COVID-19 vaccine to all of our patients. Cardiac medications (including blood thinners) should not deter anyone from being vaccinated and there is no need to hold any of those medications prior to vaccine administration.  COVID-19 Vaccine Information can be found at: ShippingScam.co.uk For questions related to vaccine distribution or appointments, please email [email or web address] or call [phone].

## 2019-03-27 NOTE — Telephone Encounter (Signed)
Called no answer - left message on answer machine -- 1 st attempt - for virtual visit will call back

## 2019-03-27 NOTE — Assessment & Plan Note (Signed)
Probably related to deconditioning and age.  She is 86 years old.  I think her recent exacerbation probably has to do with having COVID-19 and then what amounted to be a possible follow-up pneumonia.  She seems to be recovering from those 2 infections.  Hopefully as the weather improves she can get out and do more.

## 2019-03-27 NOTE — Telephone Encounter (Signed)
RN able to talk with patient. She states she had left the office for appointment. Rn apologize for the confusion.  Rn prepared patient for virtual visit.

## 2019-03-27 NOTE — Telephone Encounter (Signed)
2nd attempt to reach patient for  Virtual appointment. Left message to  Will call again. Tried mobile number - no longer in service.

## 2019-03-27 NOTE — Assessment & Plan Note (Addendum)
Has been stable.  Not recorded today, but this has not been an issue. Trying to avoid adding more medications to beta-blocker and ACE inhibitor.[name]

## 2019-03-27 NOTE — Telephone Encounter (Signed)
RN spoke to patient. Instruction were given  from today's virtual visit  March 27, 2019.  AVS SUMMARY has been mailed to patient. Recalled placed   Patient verbalized understanding.

## 2019-03-27 NOTE — Progress Notes (Signed)
Virtual Visit via Telephone Note   This visit type was conducted due to national recommendations for restrictions regarding the COVID-19 Pandemic (e.g. social distancing) in an effort to limit this patient's exposure and mitigate transmission in our community.  Due to her co-morbid illnesses, this patient is at least at moderate risk for complications without adequate follow up.  This format is felt to be most appropriate for this patient at this time.  The patient did not have access to video technology/had technical difficulties with video requiring transitioning to audio format only (telephone).  All issues noted in this document were discussed and addressed.  No physical exam could be performed with this format.  Please refer to the patient's chart for her  consent to telehealth for [facility].   Patient has given verbal permission to conduct this visit via virtual appointment and to bill insurance 03/27/2019 6:21 PM     Evaluation Performed:  Follow-up visit  Date:  03/27/2019   ID:  [name], [name] 06-29-1927, MRN [id number]  Patient Location: Home Provider Location: Other:  Hospital Office  PCP:  Lavone Orn, MD  Cardiologist:  Glenetta Hew, MD  Electrophysiologist:  None   Chief Complaint:   Chief Complaint  Patient presents with  . 3 month     Notes shortness of breath when she exerts herself, covid -in jan 2021 with husband  . Leg Swelling    Recent diagnosis of DVT; follow-up Doppler results     History of Present Illness:    [name] is a 86 y.o. female with PMH notable for hypertension, mild venous stasis edema and recent DVT who presents via audio/video conferencing for a telehealth visit today.  [name] was last seen on December 24, 2018 for left leg swelling.  She had had a evidence of DVT seen on lower extremity venous Doppler and was started on Eliquis.  Hospitalizations:  . ER presentation on January 18, 2019-diagnosed with COVID-19  (presumably contracted from her grandkids. [facility] Hospitalized February 13, 2019 with dizziness, short shortness of breath and chest tightness.  No signs or symptoms of Covid.Marland Kitchen o Discharged on Augmentin.  No other changes.     Recent - Interim CV studies:   The following studies were reviewed today: . March 26, 2018-lower extremity venous Doppler: No DVT noted in the left lower extremity.  No cystic structures or indirect stenosis.  Resolution of mild peroneal DVT with mild superficial ankle edema. . TTE February 06, 2019: EF 60 to 65%.  GR 1 DD.  No WMA.  Normal RV function.  Moderate LA dilation but normal RA size.  Mild aortic stenosis noted with peak gradient 20.6 mmHg and mean 12 mmHg.  Inerval History   [name] initially was on unclear of the directions for today's visit.  She actually went into the clinic, only to have a telephone conversation.  She actually had her Dopplers done yesterday which showed resolution of her DVT. From a cardiac standpoint she is doing fine.  She says has a little bit of swelling but it seems to be her chronic nature.  She did have about of COVID-19 that started on New Year's Eve.  She went and got tested because she had a lack of sense of taste and smell.  She says that for couple weeks she and her husband were both pretty sick no fevers, just malaise aching short of breath.  No significant coughing.  Just feeling like she wanted to  stay in bed.  She had a lot of dizziness and actually potentially had a syncopal episode having been found by her husband trying to walk back from the kitchen to the bedroom.  She recalled try to get a glass of juice and feeling weak and tired.  She was able to drink some juice and decided to go back to bed.  Apparently she did make it back to bed before collapsing. She has not had any rapid irregular heartbeats or palpitations.  Despite getting short of breath walking around, she is starting to gradually increase her level activity.  She  probably had a superinfection pneumonia on top of her Covid infection back in late January that set her back a few weeks.  She is now able to get out and walk down the driveway and back but is not yet ready to walk around the block.  Cardiovascular ROS: positive for - dyspnea on exertion, edema and 1 syncopal episode noted during her illness with COVID-19.  Most likely vasovagal negative for - chest pain, irregular heartbeat, orthopnea, palpitations, paroxysmal nocturnal dyspnea, rapid heart rate, shortness of breath or TIA/amaurosis fugax.  No recurrent syncope or near syncope.   ROS:  Please see the history of present illness.    The patient does not have symptoms concerning for COVID-19 infection (fever, chills, cough, or new shortness of breath).  No longer having symptoms  Most of her review of symptoms are stable since her infection with Covid.  Symptoms of Covid included lack of sense of taste and smell.  Dizziness and dyspnea and just generally feeling weak.  No real fevers or chills no coughing.  One episode of syncope. Review of Systems  Constitutional: Positive for malaise/fatigue (Slowly recovering from her recent illnesses.  Still notes fatigue and exertional dyspnea.). Negative for weight loss.  HENT: Negative for nosebleeds.   Respiratory: Negative for cough.   Gastrointestinal: Negative for blood in stool and melena.  Genitourinary: Negative for frequency.  Neurological: Positive for dizziness and loss of consciousness (1 episode noted in HPI).  Psychiatric/Behavioral: Negative for memory loss. The patient is not nervous/anxious and does not have insomnia.    from February forward  The patient is practicing social distancing.  Past Medical History:  Diagnosis Date  . Abnormal Pap smear 1975-76  . Anemia    history of  . Arthritis    spine  . Breast cyst, left 1980  . Cystocele 2012  . Diverticulosis    with h/o Diverticulitis  . GERD (gastroesophageal reflux disease)    . H/O hypercholesterolemia   . H/O osteopenia   . H/O varicella   . H/O varicose veins   . Hx of Mumps   . Macular degeneration   . Seasonal allergies   . Urge incontinence 2012  . Urinary frequency 2010   Past Surgical History:  Procedure Laterality Date  . ABDOMINAL HYSTERECTOMY    . BLADDER SUSPENSION N/A 06/16/2016   Procedure: TRANSVAGINAL TAPE (TVT) PROCEDURE;  Surgeon: Everett Graff, MD;  Location: [facility];  Service: Gynecology;  Laterality: N/A;  . BREAST CYST ASPIRATION  1964  . COLONOSCOPY     Dr. Earlean Shawl  . CYSTOCELE REPAIR N/A 06/16/2016   Procedure: ANTERIOR REPAIR (CYSTOCELE);  Surgeon: Everett Graff, MD;  Location: [facility];  Service: Gynecology;  Laterality: N/A;  . CYSTOSCOPY N/A 06/16/2016   Procedure: CYSTOSCOPY;  Surgeon: Everett Graff, MD;  Location: [facility];  Service: Gynecology;  Laterality: N/A;  see anterior repair  .  Lower Extremity Venous Dopplers  01/09/2012   Right and left lower steroids: No evidence of thrombus or, thrombophlebitis; right and left GSV and SSV: No venous insufficiency. Normal exam.  . NM MYOVIEW LTD  11/2016   6.6 METS.  Reached 106% of max.  Heart rate.  Walk for 4:40 min.  EF 72%.  Normal blood pressure response.  upsloping ST segment depression, nonspecific.  Otherwise normal study.  LOW RISK.  No evidence of ischemia or infarction.  . TRANSTHORACIC ECHOCARDIOGRAM  01/2018   EF > 65%. LV size small. Gr 1 DD (normal for age).   Normal atrial size. Moderate Aortic Sclerosis without Stenosis. Moderate Mitral Annular Calcification with mild MR & no MS     Current Meds  Medication Sig  . acetaminophen (TYLENOL) 500 MG tablet Take 500-1,000 mg by mouth every 8 (eight) hours as needed for mild pain.   Marland Kitchen albuterol (VENTOLIN HFA) 108 (90 Base) MCG/ACT inhaler Inhale 2 puffs into the lungs every 4 (four) hours as needed for wheezing or shortness of breath.   Marland Kitchen ascorbic acid (VITAMIN C) 500 MG tablet Take 500-1,000 mg by mouth daily with  breakfast.  . Black Elderberry (SAMBUCUS ELDERBERRY PO) Take 1 tablet by mouth daily after breakfast.  . Cholecalciferol (VITAMIN D-3) 25 MCG (1000 UT) CAPS Take 1,000 Units by mouth daily with breakfast.   . Lactobacillus Rhamnosus, GG, (CULTURELLE) CAPS Take 1 capsule by mouth every morning.   . Magnesium 250 MG TABS Take 250 mg by mouth daily as needed (leg cramps).  . meclizine (ANTIVERT) 25 MG tablet Take 1 tablet (25 mg total) by mouth 3 (three) times daily. (Patient taking differently: Take 25 mg by mouth 3 (three) times daily as needed. )  . Multiple Vitamins-Minerals (AIRBORNE PO) Take 1 tablet by mouth daily with breakfast.  . Multiple Vitamins-Minerals (CENTRUM SILVER ULTRA WOMENS) TABS Take 1 tablet by mouth daily with breakfast.   . pravastatin (PRAVACHOL) 20 MG tablet Take 20 mg by mouth daily.   Marland Kitchen zinc gluconate 50 MG tablet Take 50-100 mg by mouth daily with breakfast.  . [DISCONTINUED] apixaban (ELIQUIS) 5 MG TABS tablet Take 1 tablet (5 mg total) by mouth 2 (two) times daily. (Patient taking differently: Take 5 mg by mouth daily. )     Allergies:   Ivp dye [iodinated diagnostic agents] and Myrbetriq [mirabegron]   Social History   Tobacco Use  . Smoking status: Never Smoker  . Smokeless tobacco: Never Used  Substance Use Topics  . Alcohol use: No  . Drug use: No     Family Hx: The patient's family history includes Heart attack (age of onset: 3) in her brother; Uterine cancer (age of onset: 48) in her mother.   Labs/Other Tests and Data Reviewed:    EKG:  No ECG reviewed.  Recent Labs: 01/18/2019: ALT 15 02/13/2019: BUN 17; Creatinine, Ser 1.14 02/14/2019: Hemoglobin 10.5; Platelets 191; Potassium 4.3; Sodium 138   Recent Lipid Panel Lab Results  Component Value Date/Time   CHOL 185 02/16/2018 05:21 AM   TRIG 48 02/16/2018 05:21 AM   HDL 62 02/16/2018 05:21 AM   CHOLHDL 3.0 02/16/2018 05:21 AM   LDLCALC 113 (H) 02/16/2018 05:21 AM    Wt Readings from Last  3 Encounters:  02/14/19 124 lb 9 oz (56.5 kg)  12/24/18 132 lb 1.6 oz (59.9 kg)  11/29/18 134 lb 9.6 oz (61.1 kg)     Objective:    Vital Signs:  Ht 5\' 3"  (  1.6 m)   BMI 22.06 kg/m   VITAL SIGNS:  reviewed Pleasant elderly woman in no distress.  A&O x 3.  normal Mood & Affect Non-labored respirations   ASSESSMENT & PLAN:    Problem List Items Addressed This Visit    Essential hypertension; borderline (Chronic)    Has been stable.  Not recorded today, but this has not been an issue. Trying to avoid adding more medications to beta-blocker and ACE inhibitor..      Relevant Medications   aspirin EC 81 MG tablet   DVT, lower extremity, distal, chronic (HCC) - Primary (Chronic)    Interestingly, her Dopplers back in December showed evidence of DVT.  She has been on Eliquis now (and despite this, there was concern for possible PE when she had her Covid infection which would be very unlikely)  Swelling has improved, but still mild edema noted. Continue to recommend foot elevation and support stockings.  Follow-up Doppler showed resolution of DVT.  Instructions would be to complete current bottle of Eliquis and then stop. I really do not see any reason for her to restart aspirin.      Relevant Medications   aspirin EC 81 MG tablet   Exertional dyspnea    Probably related to deconditioning and age.  She is 86 years old.  I think her recent exacerbation probably has to do with having COVID-19 and then what amounted to be a possible follow-up pneumonia.  She seems to be recovering from those 2 infections.  Hopefully as the weather improves she can get out and do more.         COVID-19 Education: The signs and symptoms of COVID-19 were discussed with the patient and how to seek care for testing (follow up with PCP or arrange E-visit).   The importance of social distancing was discussed today.  Time:   Today, I have spent 20 minutes with the patient with telehealth technology  discussing the above problems.  4 minutes with charting   Medication Adjustments/Labs and Tests Ordered: Current medicines are reviewed at length with the patient today.  Concerns regarding medicines are outlined above.   Patient Instructions  Medication Instructions:   Stop taking Eliquis after the current bottle complete.  Okay to not restart  Enteric coated aspirin 81 mg   *If you need a refill on your cardiac medications before your next appointment, please call your pharmacy*   Lab Work: None     Testing/Procedures: None   Follow-Up: At [facility], you and your health needs are our priority.  As part of our continuing mission to provide you with exceptional heart care, we have created designated Provider Care Teams.  These Care Teams include your primary Cardiologist (physician) and Advanced Practice Providers (APPs -  Physician Assistants and Nurse Practitioners) who all work together to provide you with the care you need, when you need it.  We recommend signing up for the patient portal called "MyChart".  Sign up information is provided on this After Visit Summary.  MyChart is used to connect with patients for Virtual Visits (Telemedicine).  Patients are able to view lab/test results, encounter notes, upcoming appointments, etc.  Non-urgent messages can be sent to your provider as well.   To learn more about what you can do with MyChart, go to [email or web address].    Your next appointment:   5 month(s)  The format for your next appointment:   In Person  Provider:   Glenetta Hew, MD  Other Instructions Keep building up your energy level and exercise so he can get back to your baseline.  We are recommending the COVID-19 vaccine to all of our patients. Cardiac medications (including blood thinners) should not deter anyone from being vaccinated and there is no need to hold any of those medications prior to vaccine administration.  COVID-19 Vaccine  Information can be found at: ShippingScam.co.uk For questions related to vaccine distribution or appointments, please email [email or web address] or call [phone].        Signed, Glenetta Hew, MD  03/27/2019 6:21 PM    [place]

## 2019-03-28 ENCOUNTER — Encounter: Payer: Medicare PPO | Admitting: Physical Therapy

## 2019-03-29 ENCOUNTER — Other Ambulatory Visit: Payer: Self-pay

## 2019-03-29 ENCOUNTER — Encounter: Payer: Self-pay | Admitting: Physical Therapy

## 2019-03-29 ENCOUNTER — Ambulatory Visit: Payer: Medicare PPO | Admitting: Physical Therapy

## 2019-03-29 DIAGNOSIS — R42 Dizziness and giddiness: Secondary | ICD-10-CM

## 2019-03-29 DIAGNOSIS — R262 Difficulty in walking, not elsewhere classified: Secondary | ICD-10-CM

## 2019-03-29 DIAGNOSIS — R2681 Unsteadiness on feet: Secondary | ICD-10-CM

## 2019-03-29 DIAGNOSIS — M6281 Muscle weakness (generalized): Secondary | ICD-10-CM

## 2019-03-29 DIAGNOSIS — M25571 Pain in right ankle and joints of right foot: Secondary | ICD-10-CM

## 2019-03-29 DIAGNOSIS — R296 Repeated falls: Secondary | ICD-10-CM

## 2019-03-29 NOTE — Patient Instructions (Addendum)
Gaze Stabilization: Standing Feet Apart (Compliant Surface)    Feet apart on pillow, keeping eyes on target on wall ___3_ feet away, tilt head down 15-30 and move head side to side for __60__ seconds. Repeat while moving head up and down for __60__ seconds. Do __2__ sessions per day.

## 2019-03-29 NOTE — Therapy (Signed)
[facility] [address] [place], [place], [address] Phone: [phone]   Fax:  [phone]  Physical Therapy Treatment  Patient Details  Name: [name] MRN: [id number] Date of Birth: [birth year] Referring Provider (PT): Referred by hospital physician - Georgette Shell, MD; will send cert to PCP Lavone Orn, MD   Encounter Date: 03/29/2019  PT End of Session - 03/29/19 1419    Visit Number  5    Number of Visits  11    Date for PT Re-Evaluation  05/04/19    Authorization Type  [employer]; $20 copay    Progress Note Due on Visit  10    PT Start Time  0938    PT Stop Time  1017    PT Time Calculation (min)  39 min    Activity Tolerance  Patient tolerated treatment well    Behavior During Therapy  [facility] for tasks assessed/performed       Past Medical History:  Diagnosis Date  . Abnormal Pap smear 1975-76  . Anemia    history of  . Arthritis    spine  . Breast cyst, left 1980  . Cystocele 2012  . Diverticulosis    with h/o Diverticulitis  . GERD (gastroesophageal reflux disease)   . H/O hypercholesterolemia   . H/O osteopenia   . H/O varicella   . H/O varicose veins   . Hx of Mumps   . Macular degeneration   . Seasonal allergies   . Urge incontinence 2012  . Urinary frequency 2010    Past Surgical History:  Procedure Laterality Date  . ABDOMINAL HYSTERECTOMY    . BLADDER SUSPENSION N/A 06/16/2016   Procedure: TRANSVAGINAL TAPE (TVT) PROCEDURE;  Surgeon: Everett Graff, MD;  Location: [facility];  Service: Gynecology;  Laterality: N/A;  . BREAST CYST ASPIRATION  1964  . COLONOSCOPY     Dr. Earlean Shawl  . CYSTOCELE REPAIR N/A 06/16/2016   Procedure: ANTERIOR REPAIR (CYSTOCELE);  Surgeon: Everett Graff, MD;  Location: [facility];  Service: Gynecology;  Laterality: N/A;  . CYSTOSCOPY N/A 06/16/2016   Procedure: CYSTOSCOPY;  Surgeon: Everett Graff, MD;  Location: [facility];  Service: Gynecology;  Laterality:  N/A;  see anterior repair  . Lower Extremity Venous Dopplers  01/09/2012   Right and left lower steroids: No evidence of thrombus or, thrombophlebitis; right and left GSV and SSV: No venous insufficiency. Normal exam.  . NM MYOVIEW LTD  11/2016   6.6 METS.  Reached 106% of max.  Heart rate.  Walk for 4:40 min.  EF 72%.  Normal blood pressure response.  upsloping ST segment depression, nonspecific.  Otherwise normal study.  LOW RISK.  No evidence of ischemia or infarction.  . TRANSTHORACIC ECHOCARDIOGRAM  01/2018   EF > 65%. LV size small. Gr 1 DD (normal for age).   Normal atrial size. Moderate Aortic Sclerosis without Stenosis. Moderate Mitral Annular Calcification with mild MR & no MS    There were no vitals filed for this visit.  Subjective Assessment - 03/29/19 0942    Subjective  Per dopplers, DVT has dissolved.  Went walking outside in driveway, not able to walk around the block yet.  Went to the grocery store yesterday and when putting the groceries away she felt a little dizzy and fatigued.  Has been working on exercises.    Pertinent History  DVT, TIA, anemia, arthritis, osteopenia, macular degeneration, dizziness and falls    Diagnostic tests  CT scan  Patient Stated Goals  To get rid of the dizziness    Currently in Pain?  Yes    Pain Score  2     Pain Location  Ankle                       [facility] Adult PT Treatment/Exercise - 03/29/19 0949      Exercises   Exercises  Knee/Hip;Ankle      Knee/Hip Exercises: Aerobic   Nustep  Prior to starting Sp02: 100% and HR: 76 bpm.  Level 6 resistance x 9 minutes - at 4 minutes Sp02: 100% and HR: 85 bpm.  At end of 9 minutes pt Sp02 remained at 100% and HR: 78 bpm.      Vestibular Treatment/Exercise - 03/29/19 0945      Vestibular Treatment/Exercise   Vestibular Treatment Provided  Gaze    Gaze Exercises  X1 Viewing Horizontal;X1 Viewing Vertical      X1 Viewing Horizontal   Foot Position  feet together solid  surface; feet staggered R/L forwards; standing feet apart on pillow    Reps  4    Comments  60 seconds > 30 seconds with feet together, 60 seconds on pillow      X1 Viewing Vertical   Foot Position  feet together solid surface; feet staggered R/L forwards; standing feet apart on pillow    Reps  4    Comments  60 seconds > 30 seconds with feet together, 60 seconds on pillow       Gaze Stabilization: Standing Feet Apart (Compliant Surface)    Feet apart on pillow, keeping eyes on target on wall ___3_ feet away, tilt head down 15-30 and move head side to side for __60__ seconds. Repeat while moving head up and down for __60__ seconds. Do __2__ sessions per day.          PT Education - 03/29/19 1417    Education Details  progressed x 1 viewing to compliant surface    Person(s) Educated  Patient    Methods  Explanation;Handout;Demonstration    Comprehension  Verbalized understanding;Returned demonstration       PT Short Term Goals - 03/15/19 1608      PT SHORT TERM GOAL #1   Title  Pt will participate in further balance assessment with gait velocity and FGA - baselines TBD    Time  4    Period  Weeks    Status  Achieved    Target Date  04/04/19      PT SHORT TERM GOAL #2   Title  Pt will demonstrate independence with initial vestibular/endurance/strengthening/balance HEP    Time  4    Period  Weeks    Status  New    Target Date  05/04/19      PT SHORT TERM GOAL #3   Title  Pt will demonstrate decreased motion sensitivity to looking up/down and bending down to the ground as indicated by decrease in MSQ to 1/5    Baseline  2/5    Time  4    Period  Weeks    Status  New    Target Date  04/04/19      PT SHORT TERM GOAL #4   Title  Pt will demonstrate improved standing/activity tolerance to be able to perform 10 minutes of ADL/house chores or exercise in standing    Time  4    Period  Weeks    Status  New  Target Date  04/04/19      PT SHORT TERM GOAL #5    Title  Patient will ambulate x 500' outside over pavement with supervision and no LOB with visual scanning/head turns    Time  4    Period  Weeks    Status  New    Target Date  04/04/19        PT Long Term Goals - 03/15/19 1608      PT LONG TERM GOAL #1   Title  Pt will demonstrate independence with final HEP and will begin walking around neighborhood    Time  8    Period  Weeks    Status  New      PT LONG TERM GOAL #2   Title  Pt will demonstrate no motion sensitivity to vertical head movements and bending down to the ground    Time  8    Period  Weeks    Status  New      PT LONG TERM GOAL #3   Title  Pt will improve FGA by 4 points to indicate decreased falls risk in community    Baseline  12/30    Time  8    Period  Weeks    Status  New      PT LONG TERM GOAL #4   Title  Pt gait velocity will increase to >/= 2.62 ft/sec    Baseline  2.4    Time  8    Period  Weeks    Status  New      PT LONG TERM GOAL #5   Title  Pt will ambulate x 1000' outside over uneven pavement MOD I with no LOB during visual scanning, head turns and L/R turns    Time  8    Period  Weeks    Status  New      PT LONG TERM GOAL #6   Title  Pt will decrease DHI by 18 points and will improve overall function on FOTO to >= 74%    Baseline  44 DHI, 52% function FOTO    Time  8    Period  Weeks    Status  New            Plan - 03/29/19 1419    Clinical Impression Statement  Continued to utilize Nustep for cardiopulmonary endurance and aerobic conditioning - attempted to increase to 9 minutes today but pt reporting significant SOB afterwards and LE fatigue - will continue to perform x 8 minutes with sufficient warm up and cool down.  Continued to progress x1 viewing - attempted more narrow BOS with staggered stance but pt unable to perform without external assistance to maintain balance.  Changed to wide BOS on compliant surface with pt demonstrating ability to perform with supervision x 60  seconds - provided change for home.  Will continue to address in order to progress towards LTG.    Personal Factors and Comorbidities  Age;Comorbidity 3+;Finances;Past/Current Experience;Transportation    Comorbidities  DVT, TIA, anemia, arthritis, osteopenia, macular degeneration, dizziness and falls    Examination-Activity Limitations  Bend;Locomotion Level;Reach Overhead;Stand    Examination-Participation Restrictions  Cleaning;Community Activity;Driving;Laundry;Meal Prep    Stability/Clinical Decision Making  Evolving/Moderate complexity    Rehab Potential  Good    PT Frequency  Other (comment)   2x/week x 2; 1x/week x 6   PT Duration  8 weeks    PT Treatment/Interventions  ADLs/Self Care Home Management;DME Instruction;Gait training;Stair  training;Functional mobility training;Therapeutic activities;Therapeutic exercise;Balance training;Neuromuscular re-education;Patient/family education;Vestibular;Cryotherapy;Moist Heat;Orthotic Fit/Training;Manual techniques;Passive range of motion    PT Next Visit Plan  Check STG.  Monitor BP each visit.  Start on Nustep each visit- 8 min, level 6 resistance for aerobic conditioning.  Continue to progress x1 viewing - compliant - feet together.  Balance disc/foam: Ankle strengthening and balance training, compliant surfaces, gait with head turns, narrow BOS.  Walking outside when weather permits    Consulted and Agree with Plan of Care  Patient       Patient will benefit from skilled therapeutic intervention in order to improve the following deficits and impairments:  Decreased balance, Decreased endurance, Decreased range of motion, Decreased strength, Difficulty walking, Dizziness, Pain  Visit Diagnosis: Dizziness and giddiness  Unsteadiness on feet  Difficulty in walking, not elsewhere classified  Muscle weakness (generalized)  Pain in right ankle and joints of right foot  Repeated falls     Problem List Patient Active Problem List    Diagnosis Date Noted  . Dizziness 02/14/2019  . Chest pain 02/14/2019  . DVT, lower extremity, distal, chronic ([place]) 12/24/2018  . Palpitations 05/30/2018  . Irregular heartbeat 03/02/2018  . TIA (transient ischemic attack) 02/15/2018  . Urinary, incontinence, stress female 06/16/2016  . Right carotid bruit 12/23/2014  . Exertional dyspnea 12/24/2013  . Bilateral arm numbness and tingling while sleeping - and shortly after waking 12/24/2013  . Poor balance 12/24/2013  . Edema of left lower extremity 12/21/2012  . Essential hypertension; borderline   . H/O hypercholesterolemia   . Osteoporosis - of the spine 09/22/2011    Rico Junker, PT, DPT 03/29/19    2:25 PM    [facility] [address] [place], [place], [address] Phone: [phone]   Fax:  [phone]  Name: [name] MRN: [id number] Date of Birth: 1928/01/14

## 2019-04-05 ENCOUNTER — Ambulatory Visit: Payer: Medicare PPO | Admitting: Physical Therapy

## 2019-04-05 ENCOUNTER — Other Ambulatory Visit: Payer: Self-pay

## 2019-04-05 ENCOUNTER — Encounter: Payer: Self-pay | Admitting: Physical Therapy

## 2019-04-05 DIAGNOSIS — M25571 Pain in right ankle and joints of right foot: Secondary | ICD-10-CM

## 2019-04-05 DIAGNOSIS — R2681 Unsteadiness on feet: Secondary | ICD-10-CM

## 2019-04-05 DIAGNOSIS — R262 Difficulty in walking, not elsewhere classified: Secondary | ICD-10-CM

## 2019-04-05 DIAGNOSIS — R42 Dizziness and giddiness: Secondary | ICD-10-CM | POA: Diagnosis not present

## 2019-04-05 DIAGNOSIS — R296 Repeated falls: Secondary | ICD-10-CM

## 2019-04-05 DIAGNOSIS — M6281 Muscle weakness (generalized): Secondary | ICD-10-CM

## 2019-04-05 NOTE — Therapy (Signed)
[facility] [address] [place], [place], [address] Phone: [phone]   Fax:  [phone]  Physical Therapy Treatment  Patient Details  Name: [name] MRN: [id number] Date of Birth: August 13, 1927 Referring Provider (PT): Referred by hospital physician - Georgette Shell, MD; will send cert to PCP Lavone Orn, MD   Encounter Date: 04/05/2019  PT End of Session - 04/05/19 1252    Visit Number  6    Number of Visits  11    Date for PT Re-Evaluation  05/04/19    Authorization Type  [employer]; $20 copay    Progress Note Due on Visit  10    PT Start Time  1109    PT Stop Time  1148    PT Time Calculation (min)  39 min    Activity Tolerance  Patient tolerated treatment well    Behavior During Therapy  [facility] for tasks assessed/performed       Past Medical History:  Diagnosis Date  . Abnormal Pap smear 1975-76  . Anemia    history of  . Arthritis    spine  . Breast cyst, left 1980  . Cystocele 2012  . Diverticulosis    with h/o Diverticulitis  . GERD (gastroesophageal reflux disease)   . H/O hypercholesterolemia   . H/O osteopenia   . H/O varicella   . H/O varicose veins   . Hx of Mumps   . Macular degeneration   . Seasonal allergies   . Urge incontinence 2012  . Urinary frequency 2010    Past Surgical History:  Procedure Laterality Date  . ABDOMINAL HYSTERECTOMY    . BLADDER SUSPENSION N/A 06/16/2016   Procedure: TRANSVAGINAL TAPE (TVT) PROCEDURE;  Surgeon: Everett Graff, MD;  Location: [facility];  Service: Gynecology;  Laterality: N/A;  . BREAST CYST ASPIRATION  1964  . COLONOSCOPY     Dr. Earlean Shawl  . CYSTOCELE REPAIR N/A 06/16/2016   Procedure: ANTERIOR REPAIR (CYSTOCELE);  Surgeon: Everett Graff, MD;  Location: [facility];  Service: Gynecology;  Laterality: N/A;  . CYSTOSCOPY N/A 06/16/2016   Procedure: CYSTOSCOPY;  Surgeon: Everett Graff, MD;  Location: [facility];  Service: Gynecology;  Laterality:  N/A;  see anterior repair  . Lower Extremity Venous Dopplers  01/09/2012   Right and left lower steroids: No evidence of thrombus or, thrombophlebitis; right and left GSV and SSV: No venous insufficiency. Normal exam.  . NM MYOVIEW LTD  11/2016   6.6 METS.  Reached 106% of max.  Heart rate.  Walk for 4:40 min.  EF 72%.  Normal blood pressure response.  upsloping ST segment depression, nonspecific.  Otherwise normal study.  LOW RISK.  No evidence of ischemia or infarction.  . TRANSTHORACIC ECHOCARDIOGRAM  01/2018   EF > 65%. LV size small. Gr 1 DD (normal for age).   Normal atrial size. Moderate Aortic Sclerosis without Stenosis. Moderate Mitral Annular Calcification with mild MR & no MS    There were no vitals filed for this visit.  Subjective Assessment - 04/05/19 1113    Subjective  Was very sore after last session - thinks it is from the Nustep from last session.  No dizziness.    Pertinent History  DVT, TIA, anemia, arthritis, osteopenia, macular degeneration, dizziness and falls    Diagnostic tests  CT scan    Patient Stated Goals  To get rid of the dizziness    Currently in Pain?  No/denies  [facility] PT Assessment - 04/05/19 1119      6 Minute Walk- Baseline   6 Minute Walk- Baseline  yes    BP (mmHg)  128/60    HR (bpm)  73    02 Sat (%RA)  100 %    Modified Borg Scale for Dyspnea  2- Mild shortness of breath    Perceived Rate of Exertion (Borg)  9- very light      6 Minute walk- Post Test   6 Minute Walk Post Test  yes    BP (mmHg)  136/62    HR (bpm)  90    02 Sat (%RA)  100 %    Modified Borg Scale for Dyspnea  3- Moderate shortness of breath or breathing difficulty    Perceived Rate of Exertion (Borg)  12-      6 minute walk test results    Aerobic Endurance Distance Walked  1286    Endurance additional comments  no AD, performed inside due to weather         Vestibular Assessment - 04/05/19 1115      Positional Sensitivities   Nose to Right Knee  No  dizziness    Right Knee to Sitting  No dizziness    Nose to Left Knee  No dizziness    Left Knee to Sitting  No dizziness    Head Turning x 5  No dizziness    Head Nodding x 5  No dizziness    Pivot Right in Standing  Mild dizziness    Pivot Left in Standing  Lightheadedness               [facility] Adult PT Treatment/Exercise - 04/05/19 1149      Therapeutic Activites    Therapeutic Activities  Other Therapeutic Activities    Other Therapeutic Activities  Educated pt in how to perform pursed lip breathing when feeling SOB to slow RR; performed after 6 minute walk and balance training      Vestibular Treatment/Exercise - 04/05/19 1137      Vestibular Treatment/Exercise   Vestibular Treatment Provided  Gaze    Gaze Exercises  X1 Viewing Horizontal;X1 Viewing Vertical      X1 Viewing Horizontal   Foot Position  feet apart, feet together on compliant pillow.  Patterned background    Reps  2    Comments  60 seconds, min A when feet together on pillow      X1 Viewing Vertical   Foot Position  feet apart, feet together on compliant pillow.  Patterned background    Reps  2    Comments  60 seconds, min A when feet together on pillow            PT Education - 04/05/19 1251    Education Details  progressed x1 viewing; progress towards goals    Person(s) Educated  Patient    Methods  Explanation;Demonstration;Handout    Comprehension  Verbalized understanding;Returned demonstration       PT Short Term Goals - 04/05/19 1133      PT SHORT TERM GOAL #1   Title  Pt will participate in further balance assessment with gait velocity and FGA - baselines TBD    Time  4    Period  Weeks    Status  Achieved    Target Date  04/04/19      PT SHORT TERM GOAL #2   Title  Pt will demonstrate independence with initial vestibular/endurance/strengthening/balance HEP  Time  4    Period  Weeks    Status  Achieved    Target Date  05/04/19      PT SHORT TERM GOAL #3   Title  Pt  will demonstrate decreased motion sensitivity to looking up/down and bending down to the ground as indicated by decrease in MSQ to 1/5    Time  4    Period  Weeks    Status  Achieved    Target Date  04/04/19      PT SHORT TERM GOAL #4   Title  Pt will demonstrate improved standing/activity tolerance to be able to perform 10 minutes of ADL/house chores or exercise in standing    Baseline  standing 10 minutes or more to perform laundry and dishes    Time  4    Period  Weeks    Status  Achieved    Target Date  04/04/19      PT SHORT TERM GOAL #5   Title  Patient will ambulate x 500' outside over pavement with supervision and no LOB with visual scanning/head turns    Time  4    Period  Weeks    Status  Achieved    Target Date  04/04/19        PT Long Term Goals - 04/05/19 1256      PT LONG TERM GOAL #1   Title  Pt will demonstrate independence with final HEP and will begin walking around neighborhood  (LTG due 05/04/19)    Time  8    Period  Weeks    Status  New      PT LONG TERM GOAL #2   Title  Pt will demonstrate no motion sensitivity to vertical head movements and bending down to the ground    Time  8    Period  Weeks    Status  New      PT LONG TERM GOAL #3   Title  Pt will improve FGA by 4 points to indicate decreased falls risk in community    Baseline  12/30    Time  8    Period  Weeks    Status  New      PT LONG TERM GOAL #4   Title  Pt gait velocity will increase to >/= 2.62 ft/sec    Baseline  2.4    Time  8    Period  Weeks    Status  New      PT LONG TERM GOAL #5   Title  Pt will perform 6 minute walk test outside and ambulate >1300 feet over uneven pavement MOD I with mild SOB, no LOB with L/R turns    Baseline  >1200 feet inside for 6 minute walk with moderate DOE, intermittent veering/LOB    Time  8    Period  Weeks    Status  Revised      PT LONG TERM GOAL #6   Title  Pt will decrease DHI by 18 points and will improve overall function on FOTO to  >= 74%    Baseline  44 DHI, 52% function FOTO    Time  8    Period  Weeks    Status  New            Plan - 04/05/19 1252    Clinical Impression Statement  Did not utilize Nustep today due to significant fatigue and mm soreness after last use.  Treatment session focused  on assessment of progress towards STG.  Pt is making good progress and has met all STG.  She demonstrates decreased dizziness and decreased motion sensitivity, improved standing tolerance and improved cardiovascular endurance as evidenced by pt ability to ambulate for full 6 minutes today >1200 feet total.  Pt continues to experience moderate SOB - educated pt on use of pursed lip breathing to slow RR.  Pt continues to experience mild dizziness and LOB and continues to be at increased falls risk.  Will continue to address and progress towards LTG.    Personal Factors and Comorbidities  Age;Comorbidity 3+;Finances;Past/Current Experience;Transportation    Comorbidities  DVT, TIA, anemia, arthritis, osteopenia, macular degeneration, dizziness and falls    Examination-Activity Limitations  Bend;Locomotion Level;Reach Overhead;Stand    Examination-Participation Restrictions  Cleaning;Community Activity;Driving;Laundry;Meal Prep    Stability/Clinical Decision Making  Evolving/Moderate complexity    Rehab Potential  Good    PT Frequency  Other (comment)   2x/week x 2; 1x/week x 6   PT Duration  8 weeks    PT Treatment/Interventions  ADLs/Self Care Home Management;DME Instruction;Gait training;Stair training;Functional mobility training;Therapeutic activities;Therapeutic exercise;Balance training;Neuromuscular re-education;Patient/family education;Vestibular;Cryotherapy;Moist Heat;Orthotic Fit/Training;Manual techniques;Passive range of motion    PT Next Visit Plan  Start on Nustep each visit- 8 min, level 4 or 5 resistance for aerobic conditioning.  Balance disc/foam: Ankle strengthening and balance training, compliant surfaces,  gait with head turns, narrow BOS.  Walking outside when weather permits    Consulted and Agree with Plan of Care  Patient       Patient will benefit from skilled therapeutic intervention in order to improve the following deficits and impairments:  Decreased balance, Decreased endurance, Decreased range of motion, Decreased strength, Difficulty walking, Dizziness, Pain  Visit Diagnosis: Dizziness and giddiness  Unsteadiness on feet  Difficulty in walking, not elsewhere classified  Muscle weakness (generalized)  Pain in right ankle and joints of right foot  Repeated falls     Problem List Patient Active Problem List   Diagnosis Date Noted  . Dizziness 02/14/2019  . Chest pain 02/14/2019  . DVT, lower extremity, distal, chronic ([place]) 12/24/2018  . Palpitations 05/30/2018  . Irregular heartbeat 03/02/2018  . TIA (transient ischemic attack) 02/15/2018  . Urinary, incontinence, stress female 06/16/2016  . Right carotid bruit 12/23/2014  . Exertional dyspnea 12/24/2013  . Bilateral arm numbness and tingling while sleeping - and shortly after waking 12/24/2013  . Poor balance 12/24/2013  . Edema of left lower extremity 12/21/2012  . Essential hypertension; borderline   . H/O hypercholesterolemia   . Osteoporosis - of the spine 09/22/2011    Rico Junker, PT, DPT 04/05/19    12:59 PM    [facility] [address] [place], [place], [address] Phone: [phone]   Fax:  [phone]  Name: SEHAM GARDENHIRE MRN: [id number] Date of Birth: [birth year]

## 2019-04-05 NOTE — Patient Instructions (Addendum)
Gaze Stabilization: Standing Feet Together (Compliant Surface)    HAVE A CHAIR IN FRONT OF YOU FOR SUPPORT AND BALANCE!  Feet together on pillow, keeping eyes on target on wall __3__ feet away, tilt head down 15-30 and move head side to side for __60__ seconds. Repeat while moving head up and down for __60__ seconds. Do __2__ sessions per day.

## 2019-04-08 ENCOUNTER — Ambulatory Visit: Payer: Medicare PPO | Admitting: Physical Therapy

## 2019-04-08 ENCOUNTER — Other Ambulatory Visit: Payer: Self-pay

## 2019-04-08 ENCOUNTER — Encounter: Payer: Self-pay | Admitting: Physical Therapy

## 2019-04-08 DIAGNOSIS — R42 Dizziness and giddiness: Secondary | ICD-10-CM

## 2019-04-08 DIAGNOSIS — R2681 Unsteadiness on feet: Secondary | ICD-10-CM

## 2019-04-08 DIAGNOSIS — R296 Repeated falls: Secondary | ICD-10-CM

## 2019-04-08 DIAGNOSIS — M25571 Pain in right ankle and joints of right foot: Secondary | ICD-10-CM

## 2019-04-08 DIAGNOSIS — R262 Difficulty in walking, not elsewhere classified: Secondary | ICD-10-CM

## 2019-04-08 DIAGNOSIS — M6281 Muscle weakness (generalized): Secondary | ICD-10-CM

## 2019-04-08 NOTE — Therapy (Signed)
[facility] [address] [place], [place], [address] Phone: [phone]   Fax:  [phone]  Physical Therapy Treatment  Patient Details  Name: [name] MRN: [id number] Date of Birth: 04-12-1927 Referring Provider (PT): Referred by hospital physician - Georgette Shell, MD; will send cert to PCP Lavone Orn, MD   Encounter Date: 04/08/2019  PT End of Session - 04/08/19 1422    Visit Number  7    Number of Visits  11    Date for PT Re-Evaluation  05/04/19    Authorization Type  [employer]; $20 copay    Progress Note Due on Visit  10    PT Start Time  1151    PT Stop Time  1231    PT Time Calculation (min)  40 min    Activity Tolerance  Patient tolerated treatment well    Behavior During Therapy  [facility] for tasks assessed/performed       Past Medical History:  Diagnosis Date  . Abnormal Pap smear 1975-76  . Anemia    history of  . Arthritis    spine  . Breast cyst, left 1980  . Cystocele 2012  . Diverticulosis    with h/o Diverticulitis  . GERD (gastroesophageal reflux disease)   . H/O hypercholesterolemia   . H/O osteopenia   . H/O varicella   . H/O varicose veins   . Hx of Mumps   . Macular degeneration   . Seasonal allergies   . Urge incontinence 2012  . Urinary frequency 2010    Past Surgical History:  Procedure Laterality Date  . ABDOMINAL HYSTERECTOMY    . BLADDER SUSPENSION N/A 06/16/2016   Procedure: TRANSVAGINAL TAPE (TVT) PROCEDURE;  Surgeon: Everett Graff, MD;  Location: [facility];  Service: Gynecology;  Laterality: N/A;  . BREAST CYST ASPIRATION  1964  . COLONOSCOPY     Dr. Earlean Shawl  . CYSTOCELE REPAIR N/A 06/16/2016   Procedure: ANTERIOR REPAIR (CYSTOCELE);  Surgeon: Everett Graff, MD;  Location: [facility];  Service: Gynecology;  Laterality: N/A;  . CYSTOSCOPY N/A 06/16/2016   Procedure: CYSTOSCOPY;  Surgeon: Everett Graff, MD;  Location: [facility];  Service: Gynecology;  Laterality:  N/A;  see anterior repair  . Lower Extremity Venous Dopplers  01/09/2012   Right and left lower steroids: No evidence of thrombus or, thrombophlebitis; right and left GSV and SSV: No venous insufficiency. Normal exam.  . NM MYOVIEW LTD  11/2016   6.6 METS.  Reached 106% of max.  Heart rate.  Walk for 4:40 min.  EF 72%.  Normal blood pressure response.  upsloping ST segment depression, nonspecific.  Otherwise normal study.  LOW RISK.  No evidence of ischemia or infarction.  . TRANSTHORACIC ECHOCARDIOGRAM  01/2018   EF > 65%. LV size small. Gr 1 DD (normal for age).   Normal atrial size. Moderate Aortic Sclerosis without Stenosis. Moderate Mitral Annular Calcification with mild MR & no MS    There were no vitals filed for this visit.  Subjective Assessment - 04/08/19 1153    Subjective  No soreness after last session; has a little headache today.    Pertinent History  DVT, TIA, anemia, arthritis, osteopenia, macular degeneration, dizziness and falls    Diagnostic tests  CT scan    Patient Stated Goals  To get rid of the dizziness    Currently in Pain?  Yes    Pain Score  2     Pain Location  Head    Pain Descriptors / Indicators  Nagging;Headache                       [facility] Adult PT Treatment/Exercise - 04/08/19 1154      Ambulation/Gait   Ambulation/Gait  Yes    Ambulation/Gait Assistance  4: Min assist    Ambulation/Gait Assistance Details  walking outside over pavement with trekking poles with verbal cues and hand over hand assistance for reciprocal gait and arm swing pattern and cues to push into ground with sticks to increase balance and stride length    Ambulation Distance (Feet)  500 Feet    Assistive device  Other (Comment)   trekking poles   Gait Pattern  Step-through pattern    Ambulation Surface  Unlevel;Outdoor;Paved          Balance Exercises - 04/08/19 1212      Balance Exercises: Standing   SLS  Eyes open;Foam/compliant surface;Upper extremity  support 2;3 reps   on Balance Zone balance beam, 6 sec hold   Balance Beam  Balance zone balance beam: standing across it with feet together while performing 10 reps head turns/nods with EO    Gait with Head Turns  Forward;Upper extremity support;Intermittent upper extremity support;Other reps (comment)   up/down, L/R every 3 steps, 10 laps       PT Education - 04/08/19 1421    Education Details  advised pt to continue with current HEP and not to perform any exercises done today at home yet due to safety    Person(s) Educated  Patient    Methods  Explanation    Comprehension  Verbalized understanding       PT Short Term Goals - 04/05/19 1133      PT SHORT TERM GOAL #1   Title  Pt will participate in further balance assessment with gait velocity and FGA - baselines TBD    Time  4    Period  Weeks    Status  Achieved    Target Date  04/04/19      PT SHORT TERM GOAL #2   Title  Pt will demonstrate independence with initial vestibular/endurance/strengthening/balance HEP    Time  4    Period  Weeks    Status  Achieved    Target Date  05/04/19      PT SHORT TERM GOAL #3   Title  Pt will demonstrate decreased motion sensitivity to looking up/down and bending down to the ground as indicated by decrease in MSQ to 1/5    Time  4    Period  Weeks    Status  Achieved    Target Date  04/04/19      PT SHORT TERM GOAL #4   Title  Pt will demonstrate improved standing/activity tolerance to be able to perform 10 minutes of ADL/house chores or exercise in standing    Baseline  standing 10 minutes or more to perform laundry and dishes    Time  4    Period  Weeks    Status  Achieved    Target Date  04/04/19      PT SHORT TERM GOAL #5   Title  Patient will ambulate x 500' outside over pavement with supervision and no LOB with visual scanning/head turns    Time  4    Period  Weeks    Status  Achieved    Target Date  04/04/19  PT Long Term Goals - 04/05/19 1256      PT LONG  TERM GOAL #1   Title  Pt will demonstrate independence with final HEP and will begin walking around neighborhood  (LTG due 05/04/19)    Time  8    Period  Weeks    Status  New      PT LONG TERM GOAL #2   Title  Pt will demonstrate no motion sensitivity to vertical head movements and bending down to the ground    Time  8    Period  Weeks    Status  New      PT LONG TERM GOAL #3   Title  Pt will improve FGA by 4 points to indicate decreased falls risk in community    Baseline  12/30    Time  8    Period  Weeks    Status  New      PT LONG TERM GOAL #4   Title  Pt gait velocity will increase to >/= 2.62 ft/sec    Baseline  2.4    Time  8    Period  Weeks    Status  New      PT LONG TERM GOAL #5   Title  Pt will perform 6 minute walk test outside and ambulate >1300 feet over uneven pavement MOD I with mild SOB, no LOB with L/R turns    Baseline  >1200 feet inside for 6 minute walk with moderate DOE, intermittent veering/LOB    Time  8    Period  Weeks    Status  Revised      PT LONG TERM GOAL #6   Title  Pt will decrease DHI by 18 points and will improve overall function on FOTO to >= 74%    Baseline  44 DHI, 52% function FOTO    Time  8    Period  Weeks    Status  New            Plan - 04/08/19 1423    Clinical Impression Statement  Performed gait training outside with use of trekking poles to improve balance and facilitate arm swing and increased step/stride length.  Continued gait training inside with head turns in various directions with pt demonstrating significantly decreased step and stride length when performing head turns and pt reporting mild dizziness.  Not appropriate for pt to perform at home yet.  Utilized balance beam to focus on balance and ankle strength.  Pt reported significant dysequilibrium and reports this is how she feels when she performs certain activities.  Will continue to address and progress towards LTG.    Personal Factors and Comorbidities   Age;Comorbidity 3+;Finances;Past/Current Experience;Transportation    Comorbidities  DVT, TIA, anemia, arthritis, osteopenia, macular degeneration, dizziness and falls    Examination-Activity Limitations  Bend;Locomotion Level;Reach Overhead;Stand    Examination-Participation Restrictions  Cleaning;Community Activity;Driving;Laundry;Meal Prep    Stability/Clinical Decision Making  Evolving/Moderate complexity    Rehab Potential  Good    PT Frequency  Other (comment)   2x/week x 2; 1x/week x 6   PT Duration  8 weeks    PT Treatment/Interventions  ADLs/Self Care Home Management;DME Instruction;Gait training;Stair training;Functional mobility training;Therapeutic activities;Therapeutic exercise;Balance training;Neuromuscular re-education;Patient/family education;Vestibular;Cryotherapy;Moist Heat;Orthotic Fit/Training;Manual techniques;Passive range of motion    PT Next Visit Plan  Gait with trekking poles.  Start on Nustep each visit- 8 min, level 4 or 5 resistance for aerobic conditioning.  Balance disc/foam: Ankle strengthening and balance  training on balance beams, compliant surfaces, gait with head turns, narrow BOS    Consulted and Agree with Plan of Care  Patient       Patient will benefit from skilled therapeutic intervention in order to improve the following deficits and impairments:  Decreased balance, Decreased endurance, Decreased range of motion, Decreased strength, Difficulty walking, Dizziness, Pain  Visit Diagnosis: Dizziness and giddiness  Unsteadiness on feet  Difficulty in walking, not elsewhere classified  Muscle weakness (generalized)  Pain in right ankle and joints of right foot  Repeated falls     Problem List Patient Active Problem List   Diagnosis Date Noted  . Dizziness 02/14/2019  . Chest pain 02/14/2019  . DVT, lower extremity, distal, chronic ([place]) 12/24/2018  . Palpitations 05/30/2018  . Irregular heartbeat 03/02/2018  . TIA (transient ischemic  attack) 02/15/2018  . Urinary, incontinence, stress female 06/16/2016  . Right carotid bruit 12/23/2014  . Exertional dyspnea 12/24/2013  . Bilateral arm numbness and tingling while sleeping - and shortly after waking 12/24/2013  . Poor balance 12/24/2013  . Edema of left lower extremity 12/21/2012  . Essential hypertension; borderline   . H/O hypercholesterolemia   . Osteoporosis - of the spine 09/22/2011    Rico Junker, PT, DPT 04/08/19    2:30 PM    [facility] [address] [place], [place], [address] Phone: [phone]   Fax:  [phone]  Name: ARCOLA MATHIEU MRN: [id number] Date of Birth: 11-29-1927

## 2019-04-12 ENCOUNTER — Ambulatory Visit: Payer: Medicare PPO | Admitting: Physical Therapy

## 2019-04-12 ENCOUNTER — Encounter: Payer: Self-pay | Admitting: Physical Therapy

## 2019-04-12 ENCOUNTER — Other Ambulatory Visit: Payer: Self-pay

## 2019-04-12 DIAGNOSIS — R2681 Unsteadiness on feet: Secondary | ICD-10-CM

## 2019-04-12 DIAGNOSIS — R42 Dizziness and giddiness: Secondary | ICD-10-CM

## 2019-04-12 DIAGNOSIS — M6281 Muscle weakness (generalized): Secondary | ICD-10-CM

## 2019-04-12 DIAGNOSIS — M25571 Pain in right ankle and joints of right foot: Secondary | ICD-10-CM

## 2019-04-12 DIAGNOSIS — R296 Repeated falls: Secondary | ICD-10-CM

## 2019-04-12 DIAGNOSIS — R262 Difficulty in walking, not elsewhere classified: Secondary | ICD-10-CM

## 2019-04-12 NOTE — Therapy (Signed)
[facility] [address] [place], [place], [address] Phone: [phone]   Fax:  [phone]  Physical Therapy Treatment  Patient Details  Name: [name] MRN: [id number] Date of Birth: [birth year] Referring Provider (PT): Referred by hospital physician - Georgette Shell, MD; will send cert to PCP Lavone Orn, MD   Encounter Date: 04/12/2019  PT End of Session - 04/12/19 1150    Visit Number  8    Number of Visits  11    Date for PT Re-Evaluation  05/04/19    Authorization Type  [employer]; $20 copay    Progress Note Due on Visit  10    PT Start Time  1105    PT Stop Time  1147    PT Time Calculation (min)  42 min    Activity Tolerance  Patient tolerated treatment well    Behavior During Therapy  [facility] for tasks assessed/performed       Past Medical History:  Diagnosis Date  . Abnormal Pap smear 1975-76  . Anemia    history of  . Arthritis    spine  . Breast cyst, left 1980  . Cystocele 2012  . Diverticulosis    with h/o Diverticulitis  . GERD (gastroesophageal reflux disease)   . H/O hypercholesterolemia   . H/O osteopenia   . H/O varicella   . H/O varicose veins   . Hx of Mumps   . Macular degeneration   . Seasonal allergies   . Urge incontinence 2012  . Urinary frequency 2010    Past Surgical History:  Procedure Laterality Date  . ABDOMINAL HYSTERECTOMY    . BLADDER SUSPENSION N/A 06/16/2016   Procedure: TRANSVAGINAL TAPE (TVT) PROCEDURE;  Surgeon: Everett Graff, MD;  Location: [facility];  Service: Gynecology;  Laterality: N/A;  . BREAST CYST ASPIRATION  1964  . COLONOSCOPY     Dr. Earlean Shawl  . CYSTOCELE REPAIR N/A 06/16/2016   Procedure: ANTERIOR REPAIR (CYSTOCELE);  Surgeon: Everett Graff, MD;  Location: [facility];  Service: Gynecology;  Laterality: N/A;  . CYSTOSCOPY N/A 06/16/2016   Procedure: CYSTOSCOPY;  Surgeon: Everett Graff, MD;  Location: [facility];  Service: Gynecology;  Laterality:  N/A;  see anterior repair  . Lower Extremity Venous Dopplers  01/09/2012   Right and left lower steroids: No evidence of thrombus or, thrombophlebitis; right and left GSV and SSV: No venous insufficiency. Normal exam.  . NM MYOVIEW LTD  11/2016   6.6 METS.  Reached 106% of max.  Heart rate.  Walk for 4:40 min.  EF 72%.  Normal blood pressure response.  upsloping ST segment depression, nonspecific.  Otherwise normal study.  LOW RISK.  No evidence of ischemia or infarction.  . TRANSTHORACIC ECHOCARDIOGRAM  01/2018   EF > 65%. LV size small. Gr 1 DD (normal for age).   Normal atrial size. Moderate Aortic Sclerosis without Stenosis. Moderate Mitral Annular Calcification with mild MR & no MS    There were no vitals filed for this visit.  Subjective Assessment - 04/12/19 1108    Subjective  No headaches today.  No issues to report.  "I don't think my balance is ever going to get better."    Pertinent History  DVT, TIA, anemia, arthritis, osteopenia, macular degeneration, dizziness and falls    Diagnostic tests  CT scan    Patient Stated Goals  To get rid of the dizziness    Currently in Pain?  No/denies  [facility] Adult PT Treatment/Exercise - 04/12/19 1111      Ambulation/Gait   Ambulation/Gait  Yes    Ambulation/Gait Assistance  5: Supervision    Ambulation/Gait Assistance Details  continues training with trekking poles outside over pavement with pt only requiring supervision today, demonstrating improved sequencing and even demonstrated ability to raise gaze up and forwards    Ambulation Distance (Feet)  1000 Feet    Assistive device  Other (Comment)    Gait Pattern  Step-through pattern    Ambulation Surface  Unlevel;Outdoor;Paved    Pre-Gait Activities  prior to walking with trekking poles performed step sequence training at curb placing pole and opposite foot up on curb and then back down; performed alternating x 10 reps total with min A and verbal cues  for sequencing          Balance Exercises - 04/12/19 1127      Balance Exercises: Standing   Balance Beam  Balance Zone balance beam: standing across it and maintaining balance with wide BOS during head turns, nods x 10 each; standing across and maintaining balance while alternating each LE stepping forward to rest on balance disc x 10 reps, standing across beam and performing 10 reps squats with min A.  Changed to blue foam beam - performed 4 laps down and back along counter top tandem gait with one UE support and therapist providing min A for balance.        PT Education - 04/12/19 1149    Education Details  added more visits through May due to pt not meeting recommended frequency this cert period    Person(s) Educated  Patient    Methods  Explanation    Comprehension  Verbalized understanding       PT Short Term Goals - 04/05/19 1133      PT SHORT TERM GOAL #1   Title  Pt will participate in further balance assessment with gait velocity and FGA - baselines TBD    Time  4    Period  Weeks    Status  Achieved    Target Date  04/04/19      PT SHORT TERM GOAL #2   Title  Pt will demonstrate independence with initial vestibular/endurance/strengthening/balance HEP    Time  4    Period  Weeks    Status  Achieved    Target Date  05/04/19      PT SHORT TERM GOAL #3   Title  Pt will demonstrate decreased motion sensitivity to looking up/down and bending down to the ground as indicated by decrease in MSQ to 1/5    Time  4    Period  Weeks    Status  Achieved    Target Date  04/04/19      PT SHORT TERM GOAL #4   Title  Pt will demonstrate improved standing/activity tolerance to be able to perform 10 minutes of ADL/house chores or exercise in standing    Baseline  standing 10 minutes or more to perform laundry and dishes    Time  4    Period  Weeks    Status  Achieved    Target Date  04/04/19      PT SHORT TERM GOAL #5   Title  Patient will ambulate x 500' outside over  pavement with supervision and no LOB with visual scanning/head turns    Time  4    Period  Weeks    Status  Achieved    Target  Date  04/04/19        PT Long Term Goals - 04/05/19 1256      PT LONG TERM GOAL #1   Title  Pt will demonstrate independence with final HEP and will begin walking around neighborhood  (LTG due 05/04/19)    Time  8    Period  Weeks    Status  New      PT LONG TERM GOAL #2   Title  Pt will demonstrate no motion sensitivity to vertical head movements and bending down to the ground    Time  8    Period  Weeks    Status  New      PT LONG TERM GOAL #3   Title  Pt will improve FGA by 4 points to indicate decreased falls risk in community    Baseline  12/30    Time  8    Period  Weeks    Status  New      PT LONG TERM GOAL #4   Title  Pt gait velocity will increase to >/= 2.62 ft/sec    Baseline  2.4    Time  8    Period  Weeks    Status  New      PT LONG TERM GOAL #5   Title  Pt will perform 6 minute walk test outside and ambulate >1300 feet over uneven pavement MOD I with mild SOB, no LOB with L/R turns    Baseline  >1200 feet inside for 6 minute walk with moderate DOE, intermittent veering/LOB    Time  8    Period  Weeks    Status  Revised      PT LONG TERM GOAL #6   Title  Pt will decrease DHI by 18 points and will improve overall function on FOTO to >= 74%    Baseline  44 DHI, 52% function FOTO    Time  8    Period  Weeks    Status  New            Plan - 04/12/19 1150    Clinical Impression Statement  Pt demonstrated significant improvement in sequencing and balance with use of trekking poles for gait outside.  Pt able to keep gaze lifted intermittently while ambulating outside.  Continued to utilize balance beam to focus on ankle strengthening, hip strengthening and balance reaction training.  Pt continues to experience increased LOB on balance beam - will continue to address in order to progress towards LTG.    Personal Factors and  Comorbidities  Age;Comorbidity 3+;Finances;Past/Current Experience;Transportation    Comorbidities  DVT, TIA, anemia, arthritis, osteopenia, macular degeneration, dizziness and falls    Examination-Activity Limitations  Bend;Locomotion Level;Reach Overhead;Stand    Examination-Participation Restrictions  Cleaning;Community Activity;Driving;Laundry;Meal Prep    Stability/Clinical Decision Making  Evolving/Moderate complexity    Rehab Potential  Good    PT Frequency  Other (comment)   2x/week x 2; 1x/week x 6   PT Duration  8 weeks    PT Treatment/Interventions  ADLs/Self Care Home Management;DME Instruction;Gait training;Stair training;Functional mobility training;Therapeutic activities;Therapeutic exercise;Balance training;Neuromuscular re-education;Patient/family education;Vestibular;Cryotherapy;Moist Heat;Orthotic Fit/Training;Manual techniques;Passive range of motion    PT Next Visit Plan  Gait with trekking poles outside longer distances, different surfaces.  Start on Nustep each visit- 8 min, level 4 or 5 resistance for aerobic conditioning.  tandem stance/gait; Ankle strengthening and balance training on balance beams, compliant surfaces, gait with head turns, narrow BOS    Consulted and  Agree with Plan of Care  Patient       Patient will benefit from skilled therapeutic intervention in order to improve the following deficits and impairments:  Decreased balance, Decreased endurance, Decreased range of motion, Decreased strength, Difficulty walking, Dizziness, Pain  Visit Diagnosis: Dizziness and giddiness  Unsteadiness on feet  Difficulty in walking, not elsewhere classified  Muscle weakness (generalized)  Pain in right ankle and joints of right foot  Repeated falls     Problem List Patient Active Problem List   Diagnosis Date Noted  . Dizziness 02/14/2019  . Chest pain 02/14/2019  . DVT, lower extremity, distal, chronic ([place]) 12/24/2018  . Palpitations 05/30/2018  .  Irregular heartbeat 03/02/2018  . TIA (transient ischemic attack) 02/15/2018  . Urinary, incontinence, stress female 06/16/2016  . Right carotid bruit 12/23/2014  . Exertional dyspnea 12/24/2013  . Bilateral arm numbness and tingling while sleeping - and shortly after waking 12/24/2013  . Poor balance 12/24/2013  . Edema of left lower extremity 12/21/2012  . Essential hypertension; borderline   . H/O hypercholesterolemia   . Osteoporosis - of the spine 09/22/2011   Rico Junker, PT, DPT 04/12/19    11:55 AM    [facility] [address] [place], [place], [address] Phone: [phone]   Fax:  [phone]  Name: [name] MRN: [id number] Date of Birth: May 08, 1927

## 2019-04-15 ENCOUNTER — Ambulatory Visit: Payer: Medicare PPO | Admitting: Physical Therapy

## 2019-04-15 ENCOUNTER — Encounter: Payer: Self-pay | Admitting: Physical Therapy

## 2019-04-15 ENCOUNTER — Other Ambulatory Visit: Payer: Self-pay

## 2019-04-15 DIAGNOSIS — R262 Difficulty in walking, not elsewhere classified: Secondary | ICD-10-CM

## 2019-04-15 DIAGNOSIS — R296 Repeated falls: Secondary | ICD-10-CM

## 2019-04-15 DIAGNOSIS — M6281 Muscle weakness (generalized): Secondary | ICD-10-CM

## 2019-04-15 DIAGNOSIS — R2681 Unsteadiness on feet: Secondary | ICD-10-CM

## 2019-04-15 DIAGNOSIS — R42 Dizziness and giddiness: Secondary | ICD-10-CM | POA: Diagnosis not present

## 2019-04-15 NOTE — Therapy (Signed)
[facility] [address] [place], [place], [address] Phone: [phone]   Fax:  [phone]  Physical Therapy Treatment  Patient Details  Name: [name] MRN: [id number] Date of Birth: [birth year] Referring Provider (PT): Referred by hospital physician - Georgette Shell, MD; will send cert to PCP Lavone Orn, MD   Encounter Date: 04/15/2019  PT End of Session - 04/15/19 1440    Visit Number  9    Number of Visits  11    Date for PT Re-Evaluation  05/04/19    Authorization Type  [employer]; $20 copay    Progress Note Due on Visit  10    PT Start Time  1151    PT Stop Time  1231    PT Time Calculation (min)  40 min    Activity Tolerance  Patient tolerated treatment well    Behavior During Therapy  [facility] for tasks assessed/performed       Past Medical History:  Diagnosis Date  . Abnormal Pap smear 1975-76  . Anemia    history of  . Arthritis    spine  . Breast cyst, left 1980  . Cystocele 2012  . Diverticulosis    with h/o Diverticulitis  . GERD (gastroesophageal reflux disease)   . H/O hypercholesterolemia   . H/O osteopenia   . H/O varicella   . H/O varicose veins   . Hx of Mumps   . Macular degeneration   . Seasonal allergies   . Urge incontinence 2012  . Urinary frequency 2010    Past Surgical History:  Procedure Laterality Date  . ABDOMINAL HYSTERECTOMY    . BLADDER SUSPENSION N/A 06/16/2016   Procedure: TRANSVAGINAL TAPE (TVT) PROCEDURE;  Surgeon: Everett Graff, MD;  Location: [facility];  Service: Gynecology;  Laterality: N/A;  . BREAST CYST ASPIRATION  1964  . COLONOSCOPY     Dr. Earlean Shawl  . CYSTOCELE REPAIR N/A 06/16/2016   Procedure: ANTERIOR REPAIR (CYSTOCELE);  Surgeon: Everett Graff, MD;  Location: [facility];  Service: Gynecology;  Laterality: N/A;  . CYSTOSCOPY N/A 06/16/2016   Procedure: CYSTOSCOPY;  Surgeon: Everett Graff, MD;  Location: [facility];  Service: Gynecology;  Laterality:  N/A;  see anterior repair  . Lower Extremity Venous Dopplers  01/09/2012   Right and left lower steroids: No evidence of thrombus or, thrombophlebitis; right and left GSV and SSV: No venous insufficiency. Normal exam.  . NM MYOVIEW LTD  11/2016   6.6 METS.  Reached 106% of max.  Heart rate.  Walk for 4:40 min.  EF 72%.  Normal blood pressure response.  upsloping ST segment depression, nonspecific.  Otherwise normal study.  LOW RISK.  No evidence of ischemia or infarction.  . TRANSTHORACIC ECHOCARDIOGRAM  01/2018   EF > 65%. LV size small. Gr 1 DD (normal for age).   Normal atrial size. Moderate Aortic Sclerosis without Stenosis. Moderate Mitral Annular Calcification with mild MR & no MS    There were no vitals filed for this visit.  Subjective Assessment - 04/15/19 1154    Subjective  Quiet weekend; did not get to walk outside due to the rain.  No vertigo but still feels like a "drunk" when walking.    Pertinent History  DVT, TIA, anemia, arthritis, osteopenia, macular degeneration, dizziness and falls    Diagnostic tests  CT scan    Patient Stated Goals  To get rid of the dizziness    Currently in Pain?  No/denies  [facility] Adult PT Treatment/Exercise - 04/15/19 1226      Neuro Re-ed    Neuro Re-ed Details   standing and reaching to the floor to pick up cones and put them back on mat in front and then from mat > floor x 4 cones and then 6 cones x 2 sets.  Walking forwards and reaching down and to the R, turning to put cones on mat to L and then reverse mat > floor R > L with intermittent sitting rest breaks due to dizziness.       Vestibular Treatment/Exercise - 04/15/19 1158      Vestibular Treatment/Exercise   Vestibular Treatment Provided  Gaze    Habituation Exercises  Comment   Reaching to the floor; see NMR   Gaze Exercises  X1 Viewing Horizontal;X1 Viewing Vertical      X1 Viewing Horizontal   Foot Position  attempted to walk forwards and  backwards to letter but pt required min A to perform safely; changed back to staggered stance R/L foot forwards    Reps  5    Comments   not provided for home yet; 6 ft forwards and then back; changed horizontal to 60 seconds with staggered stance for home.  Mild symptoms      X1 Viewing Vertical   Foot Position  marching in place, not able to coordinate.  Changed to walking forwards and backwards 6 ft x 4 laps    Reps  4    Comments  no symptoms, able to do with supervision         Gaze Stabilization: Walking Toward Target    Keeping eyes on target, walk toward target on wall __6__ feet away, moving head up and down; keep head moving up and down while walking backwards away from target.  Perform 4 laps forwards and backwards.     Gaze Stabilization: Standing Feet Partial Heel-Toe    Feet in partial heel-toe position, keeping eyes fixed on target on wall __3__ feet away, tilt head down 15-30 and move head side to side for __60__ seconds.  Switch feet and repeat, 60 seconds.  SIT TO STAND: Pillow    Feet: shoulder-width on pillow. Lean chest forward. Raise hips and straighten knees to stand. _12__ reps per set, _2__ sets per day, __5_ days per week. Have chair or counter in front for support if needed.   Foot: Heel Raises    In sitting, keep toes on the ground, press through toes and lift heels up in the air.  Bring back down and repeat 12 times.    Resisted Toe Raises   Setup Begin sitting upright holding one end of a resistance band anchored under one foot and looped around the right.  Bend your foot upward, pulling against the resistance. Hold briefly then slowly lower your foot back down and repeat 12 times. Make sure to keep both heels on the ground during the exercise.   Resisted Ankle Turn Out   Setup Begin sitting in an upright position with a resistance loop around both feet, flat on the ground.  Lift your Right toes up and to the Right against the  resistance of the band.  Try to keep your knee straight.  Repeat 12 times on the Right side    Single Leg - Eyes Open    Holding support with ONE HAND, lift right leg while maintaining balance over left leg.  Hold 10 seconds. Switch feet and repeat for 10 seconds, standing tall.  Repeat __2__ times per leg, per session. Do __2__ sessions per day.   Feet Heel-Toe "Tandem"    HOLDING YOUR COUNTER TOP WITH ONE HAND - walk a straight line bringing one foot directly in front of the other, SLOWLY - PAUSE AFTER EACH STEP Repeat for 4 LAPS, DOWN AND BACK EACH session. Do __2__ sessions per day.            PT Education - 04/15/19 1230    Education Details  upgraded HEP and began to address bending down to floor    Person(s) Educated  Patient    Methods  Explanation;Demonstration;Handout    Comprehension  Verbalized understanding;Returned demonstration       PT Short Term Goals - 04/05/19 1133      PT SHORT TERM GOAL #1   Title  Pt will participate in further balance assessment with gait velocity and FGA - baselines TBD    Time  4    Period  Weeks    Status  Achieved    Target Date  04/04/19      PT SHORT TERM GOAL #2   Title  Pt will demonstrate independence with initial vestibular/endurance/strengthening/balance HEP    Time  4    Period  Weeks    Status  Achieved    Target Date  05/04/19      PT SHORT TERM GOAL #3   Title  Pt will demonstrate decreased motion sensitivity to looking up/down and bending down to the ground as indicated by decrease in MSQ to 1/5    Time  4    Period  Weeks    Status  Achieved    Target Date  04/04/19      PT SHORT TERM GOAL #4   Title  Pt will demonstrate improved standing/activity tolerance to be able to perform 10 minutes of ADL/house chores or exercise in standing    Baseline  standing 10 minutes or more to perform laundry and dishes    Time  4    Period  Weeks    Status  Achieved    Target Date  04/04/19      PT SHORT  TERM GOAL #5   Title  Patient will ambulate x 500' outside over pavement with supervision and no LOB with visual scanning/head turns    Time  4    Period  Weeks    Status  Achieved    Target Date  04/04/19        PT Long Term Goals - 04/05/19 1256      PT LONG TERM GOAL #1   Title  Pt will demonstrate independence with final HEP and will begin walking around neighborhood  (LTG due 05/04/19)    Time  8    Period  Weeks    Status  New      PT LONG TERM GOAL #2   Title  Pt will demonstrate no motion sensitivity to vertical head movements and bending down to the ground    Time  8    Period  Weeks    Status  New      PT LONG TERM GOAL #3   Title  Pt will improve FGA by 4 points to indicate decreased falls risk in community    Baseline  12/30    Time  8    Period  Weeks    Status  New      PT LONG TERM GOAL #4   Title  Pt gait  velocity will increase to >/= 2.62 ft/sec    Baseline  2.4    Time  8    Period  Weeks    Status  New      PT LONG TERM GOAL #5   Title  Pt will perform 6 minute walk test outside and ambulate >1300 feet over uneven pavement MOD I with mild SOB, no LOB with L/R turns    Baseline  >1200 feet inside for 6 minute walk with moderate DOE, intermittent veering/LOB    Time  8    Period  Weeks    Status  Revised      PT LONG TERM GOAL #6   Title  Pt will decrease DHI by 18 points and will improve overall function on FOTO to >= 74%    Baseline  44 DHI, 52% function FOTO    Time  8    Period  Weeks    Status  New            Plan - 04/15/19 1441    Clinical Impression Statement  Pt continues to feel significant dysequilibrium with gait; attempted to progress x1 viewing to more dynamic movement (marching in place, walking forwards/backwards) but only able to perform safely with vertical head movements currently; horizontal progressed to staggered stance.  Initiated habituation training for bending down to floor; pt did become moderately symptomatic with  bending down to floor and turning to L and R.  Will continue to address to progress towards LTG.    Personal Factors and Comorbidities  Age;Comorbidity 3+;Finances;Past/Current Experience;Transportation    Comorbidities  DVT, TIA, anemia, arthritis, osteopenia, macular degeneration, dizziness and falls    Examination-Activity Limitations  Bend;Locomotion Level;Reach Overhead;Stand    Examination-Participation Restrictions  Cleaning;Community Activity;Driving;Laundry;Meal Prep    Stability/Clinical Decision Making  Evolving/Moderate complexity    Rehab Potential  Good    PT Frequency  Other (comment)   2x/week x 2; 1x/week x 6   PT Duration  8 weeks    PT Treatment/Interventions  ADLs/Self Care Home Management;DME Instruction;Gait training;Stair training;Functional mobility training;Therapeutic activities;Therapeutic exercise;Balance training;Neuromuscular re-education;Patient/family education;Vestibular;Cryotherapy;Moist Heat;Orthotic Fit/Training;Manual techniques;Passive range of motion    PT Next Visit Plan  Bending down to floor to pick up items-add in rotation; Gait with trekking poles outside longer distances, different surfaces.  Start on Nustep each visit- 8 min, level 4 or 5 resistance for aerobic conditioning.  tandem stance/gait; Ankle strengthening and balance training on balance beams, compliant surfaces, gait with head turns, narrow BOS    Consulted and Agree with Plan of Care  Patient       Patient will benefit from skilled therapeutic intervention in order to improve the following deficits and impairments:  Decreased balance, Decreased endurance, Decreased range of motion, Decreased strength, Difficulty walking, Dizziness, Pain  Visit Diagnosis: Dizziness and giddiness  Unsteadiness on feet  Difficulty in walking, not elsewhere classified  Muscle weakness (generalized)  Repeated falls     Problem List Patient Active Problem List   Diagnosis Date Noted  . Dizziness  02/14/2019  . Chest pain 02/14/2019  . DVT, lower extremity, distal, chronic ([place]) 12/24/2018  . Palpitations 05/30/2018  . Irregular heartbeat 03/02/2018  . TIA (transient ischemic attack) 02/15/2018  . Urinary, incontinence, stress female 06/16/2016  . Right carotid bruit 12/23/2014  . Exertional dyspnea 12/24/2013  . Bilateral arm numbness and tingling while sleeping - and shortly after waking 12/24/2013  . Poor balance 12/24/2013  . Edema of left lower extremity 12/21/2012  .  Essential hypertension; borderline   . H/O hypercholesterolemia   . Osteoporosis - of the spine 09/22/2011    Rico Junker, PT, DPT 04/15/19    2:46 PM    [facility] [address] [place], [place], [address] Phone: [phone]   Fax:  [phone]  Name: KAYLII MORONTA MRN: [id number] Date of Birth: [birth year]

## 2019-04-15 NOTE — Patient Instructions (Addendum)
  Gaze Stabilization: Walking Toward Target    Keeping eyes on target, walk toward target on wall __6__ feet away, moving head up and down; keep head moving up and down while walking backwards away from target.  Perform 4 laps forwards and backwards.     Gaze Stabilization: Standing Feet Partial Heel-Toe    Feet in partial heel-toe position, keeping eyes fixed on target on wall __3__ feet away, tilt head down 15-30 and move head side to side for __60__ seconds.  Switch feet and repeat, 60 seconds.  SIT TO STAND: Pillow    Feet: shoulder-width on pillow. Lean chest forward. Raise hips and straighten knees to stand. _12__ reps per set, _2__ sets per day, __5_ days per week. Have chair or counter in front for support if needed.   Foot: Heel Raises    In sitting, keep toes on the ground, press through toes and lift heels up in the air.  Bring back down and repeat 12 times.    Resisted Toe Raises   Setup Begin sitting upright holding one end of a resistance band anchored under one foot and looped around the right.  Bend your foot upward, pulling against the resistance. Hold briefly then slowly lower your foot back down and repeat 12 times. Make sure to keep both heels on the ground during the exercise.   Resisted Ankle Turn Out   Setup Begin sitting in an upright position with a resistance loop around both feet, flat on the ground.  Lift your Right toes up and to the Right against the resistance of the band.  Try to keep your knee straight.  Repeat 12 times on the Right side    Single Leg - Eyes Open    Holding support with ONE HAND, lift right leg while maintaining balance over left leg.  Hold 10 seconds. Switch feet and repeat for 10 seconds, standing tall. Repeat __2__ times per leg, per session. Do __2__ sessions per day.   Feet Heel-Toe "Tandem"    HOLDING YOUR COUNTER TOP WITH ONE HAND - walk a straight line bringing one foot directly in front of the  other, SLOWLY - PAUSE AFTER EACH STEP Repeat for 4 LAPS, DOWN AND BACK EACH session. Do __2__ sessions per day.

## 2019-04-22 ENCOUNTER — Ambulatory Visit: Payer: Medicare PPO | Attending: Internal Medicine | Admitting: Physical Therapy

## 2019-04-22 ENCOUNTER — Other Ambulatory Visit: Payer: Self-pay

## 2019-04-22 VITALS — BP 121/69 | HR 75

## 2019-04-22 DIAGNOSIS — M25571 Pain in right ankle and joints of right foot: Secondary | ICD-10-CM | POA: Insufficient documentation

## 2019-04-22 DIAGNOSIS — R296 Repeated falls: Secondary | ICD-10-CM | POA: Diagnosis not present

## 2019-04-22 DIAGNOSIS — M6281 Muscle weakness (generalized): Secondary | ICD-10-CM | POA: Diagnosis not present

## 2019-04-22 DIAGNOSIS — R42 Dizziness and giddiness: Secondary | ICD-10-CM

## 2019-04-22 DIAGNOSIS — R2681 Unsteadiness on feet: Secondary | ICD-10-CM | POA: Diagnosis not present

## 2019-04-22 DIAGNOSIS — R262 Difficulty in walking, not elsewhere classified: Secondary | ICD-10-CM | POA: Insufficient documentation

## 2019-04-22 NOTE — Therapy (Signed)
[facility] [address] [place], [place], [address] Phone: [phone]   Fax:  [phone]  Physical Therapy Treatment  Patient Details  Name: [name] MRN: [id number] Date of Birth: 10/26/1927 Referring Provider (PT): Referred by hospital physician - Georgette Shell, MD; will send cert to PCP Lavone Orn, MD   Encounter Date: 04/22/2019   Progress Note Reporting Period 03/04/19 to 04/22/19  See note below for Objective Data and Assessment of Progress/Goals.    PT End of Session - 04/22/19 2219    Visit Number  10    Number of Visits  11    Date for PT Re-Evaluation  05/04/19    Authorization Type  [employer]; $20 copay    Progress Note Due on Visit  20    PT Start Time  1152    PT Stop Time  1235    PT Time Calculation (min)  43 min    Activity Tolerance  Patient tolerated treatment well    Behavior During Therapy  WFL for tasks assessed/performed       Past Medical History:  Diagnosis Date  . Abnormal Pap smear 1975-76  . Anemia    history of  . Arthritis    spine  . Breast cyst, left 1980  . Cystocele 2012  . Diverticulosis    with h/o Diverticulitis  . GERD (gastroesophageal reflux disease)   . H/O hypercholesterolemia   . H/O osteopenia   . H/O varicella   . H/O varicose veins   . Hx of Mumps   . Macular degeneration   . Seasonal allergies   . Urge incontinence 2012  . Urinary frequency 2010    Past Surgical History:  Procedure Laterality Date  . ABDOMINAL HYSTERECTOMY    . BLADDER SUSPENSION N/A 06/16/2016   Procedure: TRANSVAGINAL TAPE (TVT) PROCEDURE;  Surgeon: Everett Graff, MD;  Location: [facility];  Service: Gynecology;  Laterality: N/A;  . BREAST CYST ASPIRATION  1964  . COLONOSCOPY     Dr. Earlean Shawl  . CYSTOCELE REPAIR N/A 06/16/2016   Procedure: ANTERIOR REPAIR (CYSTOCELE);  Surgeon: Everett Graff, MD;  Location: [facility];  Service: Gynecology;  Laterality: N/A;  . CYSTOSCOPY  N/A 06/16/2016   Procedure: CYSTOSCOPY;  Surgeon: Everett Graff, MD;  Location: [facility];  Service: Gynecology;  Laterality: N/A;  see anterior repair  . Lower Extremity Venous Dopplers  01/09/2012   Right and left lower steroids: No evidence of thrombus or, thrombophlebitis; right and left GSV and SSV: No venous insufficiency. Normal exam.  . NM MYOVIEW LTD  11/2016   6.6 METS.  Reached 106% of max.  Heart rate.  Walk for 4:40 min.  EF 72%.  Normal blood pressure response.  upsloping ST segment depression, nonspecific.  Otherwise normal study.  LOW RISK.  No evidence of ischemia or infarction.  . TRANSTHORACIC ECHOCARDIOGRAM  01/2018   EF > 65%. LV size small. Gr 1 DD (normal for age).   Normal atrial size. Moderate Aortic Sclerosis without Stenosis. Moderate Mitral Annular Calcification with mild MR & no MS    Vitals:   04/22/19 1203  BP: 121/69  Pulse: 75    Subjective Assessment - 04/22/19 1159    Subjective  Doing okay, exercises went okay this weekend but would like to review.  Gets a little anxious because she feels like she isn't getting any better.  Still having L shoulder pain but hasn't scheduled an appointment with orthopedic surgeon.  Pertinent History  DVT, TIA, anemia, arthritis, osteopenia, macular degeneration, dizziness and falls    Diagnostic tests  CT scan    Patient Stated Goals  To get rid of the dizziness    Currently in Pain?  No/denies                        Vestibular Treatment/Exercise - 04/22/19 1204      Vestibular Treatment/Exercise   Vestibular Treatment Provided  Gaze    Habituation Exercises  Standing Diagonal Head Turns    Gaze Exercises  X1 Viewing Horizontal;X1 Viewing Vertical      Standing Diagonal Head Turns   Number of Reps   7    Symptiom Description   8 sets; mat low and then mat high.  First 4 sets performed walking forwards starting with cones on mat R <> floor L and then changing to mat L <> floor R.  Last 4 sets  performed forwards and backwards walking mat R <> floor L, Mat L <> floor R.  Mild dizziness and SOB      X1 Viewing Horizontal   Foot Position  staggered stance, L and R foot forwards    Reps  4    Comments  mild symptoms of dizziness, mild imbalance.  Second set of each  - pt reported moderate symptoms      X1 Viewing Vertical   Foot Position  walking forwards and backwards in 6 ft space     Reps  8    Comments  no symptoms, mild LOB and verbal cues for smooth vertical head movements at consistent speed            PT Education - 04/22/19 2218    Education Details  no changes to HEP today; may reduce frequency to 1x/week due to financial concern and may need shoulder surgery    Person(s) Educated  Patient    Methods  Explanation    Comprehension  Verbalized understanding       PT Short Term Goals - 04/05/19 1133      PT SHORT TERM GOAL #1   Title  Pt will participate in further balance assessment with gait velocity and FGA - baselines TBD    Time  4    Period  Weeks    Status  Achieved    Target Date  04/04/19      PT SHORT TERM GOAL #2   Title  Pt will demonstrate independence with initial vestibular/endurance/strengthening/balance HEP    Time  4    Period  Weeks    Status  Achieved    Target Date  05/04/19      PT SHORT TERM GOAL #3   Title  Pt will demonstrate decreased motion sensitivity to looking up/down and bending down to the ground as indicated by decrease in MSQ to 1/5    Time  4    Period  Weeks    Status  Achieved    Target Date  04/04/19      PT SHORT TERM GOAL #4   Title  Pt will demonstrate improved standing/activity tolerance to be able to perform 10 minutes of ADL/house chores or exercise in standing    Baseline  standing 10 minutes or more to perform laundry and dishes    Time  4    Period  Weeks    Status  Achieved    Target Date  04/04/19      PT  SHORT TERM GOAL #5   Title  Patient will ambulate x 500' outside over pavement with  supervision and no LOB with visual scanning/head turns    Time  4    Period  Weeks    Status  Achieved    Target Date  04/04/19        PT Long Term Goals - 04/05/19 1256      PT LONG TERM GOAL #1   Title  Pt will demonstrate independence with final HEP and will begin walking around neighborhood  (LTG due 05/04/19)    Time  8    Period  Weeks    Status  New      PT LONG TERM GOAL #2   Title  Pt will demonstrate no motion sensitivity to vertical head movements and bending down to the ground    Time  8    Period  Weeks    Status  New      PT LONG TERM GOAL #3   Title  Pt will improve FGA by 4 points to indicate decreased falls risk in community    Baseline  12/30    Time  8    Period  Weeks    Status  New      PT LONG TERM GOAL #4   Title  Pt gait velocity will increase to >/= 2.62 ft/sec    Baseline  2.4    Time  8    Period  Weeks    Status  New      PT LONG TERM GOAL #5   Title  Pt will perform 6 minute walk test outside and ambulate >1300 feet over uneven pavement MOD I with mild SOB, no LOB with L/R turns    Baseline  >1200 feet inside for 6 minute walk with moderate DOE, intermittent veering/LOB    Time  8    Period  Weeks    Status  Revised      PT LONG TERM GOAL #6   Title  Pt will decrease DHI by 18 points and will improve overall function on FOTO to >= 74%    Baseline  44 DHI, 52% function FOTO    Time  8    Period  Weeks    Status  New            Plan - 04/22/19 2220    Clinical Impression Statement  Pt continues to make steady progress.  Pt has progressed to being able to perform x1 viewing with more dynamic movement and with narrow BOS.  Therapy continues to progress habituation exercises with increased repetitions and height change with reaching down and back up from floor with pt reporting mild dizziness.  Pt continues to demonstrate decreased cardiopulmonary endurance with activities and will benefit from continued skilled PT services to address  these impairments to maximize functional mobility independence and decrease falls risk.  Pt may decide to drop down to one time a week in May due to financial concerns and possible need for shoulder surgery.    Personal Factors and Comorbidities  Age;Comorbidity 3+;Finances;Past/Current Experience;Transportation    Comorbidities  DVT, TIA, anemia, arthritis, osteopenia, macular degeneration, dizziness and falls    Examination-Activity Limitations  Bend;Locomotion Level;Reach Overhead;Stand    Examination-Participation Restrictions  Cleaning;Community Activity;Driving;Laundry;Meal Prep    Stability/Clinical Decision Making  Evolving/Moderate complexity    Rehab Potential  Good    PT Frequency  Other (comment)   2x/week x 2; 1x/week x 6  PT Duration  8 weeks    PT Treatment/Interventions  ADLs/Self Care Home Management;DME Instruction;Gait training;Stair training;Functional mobility training;Therapeutic activities;Therapeutic exercise;Balance training;Neuromuscular re-education;Patient/family education;Vestibular;Cryotherapy;Moist Heat;Orthotic Fit/Training;Manual techniques;Passive range of motion    PT Next Visit Plan  Decrease to 1x/week in MAY?  CHECK LTG and recert.  Bending down to floor to pick up items-add in rotation; Gait with trekking poles outside longer distances, different surfaces.  Start on Nustep each visit- 8 min, level 4 or 5 resistance for aerobic conditioning.  tandem stance/gait; Ankle strengthening and balance training on balance beams, compliant surfaces, gait with head turns, narrow BOS    Consulted and Agree with Plan of Care  Patient       Patient will benefit from skilled therapeutic intervention in order to improve the following deficits and impairments:  Decreased balance, Decreased endurance, Decreased range of motion, Decreased strength, Difficulty walking, Dizziness, Pain  Visit Diagnosis: Dizziness and giddiness  Unsteadiness on feet  Difficulty in walking, not  elsewhere classified  Muscle weakness (generalized)  Repeated falls     Problem List Patient Active Problem List   Diagnosis Date Noted  . Dizziness 02/14/2019  . Chest pain 02/14/2019  . DVT, lower extremity, distal, chronic ([facility]) 12/24/2018  . Palpitations 05/30/2018  . Irregular heartbeat 03/02/2018  . TIA (transient ischemic attack) 02/15/2018  . Urinary, incontinence, stress female 06/16/2016  . Right carotid bruit 12/23/2014  . Exertional dyspnea 12/24/2013  . Bilateral arm numbness and tingling while sleeping - and shortly after waking 12/24/2013  . Poor balance 12/24/2013  . Edema of left lower extremity 12/21/2012  . Essential hypertension; borderline   . H/O hypercholesterolemia   . Osteoporosis - of the spine 09/22/2011    Rico Junker, PT, DPT 04/22/19    10:27 PM    [facility] [address] [place], [place], [address] Phone: [phone]   Fax:  [phone]  Name: DISIREE SALTER MRN: [id number] Date of Birth: Oct 06, 1927

## 2019-04-22 NOTE — Patient Instructions (Signed)
  Gaze Stabilization: Walking Toward Target    Keeping eyes on target, walk toward target on wall __6__ feet away, moving head up and down; keep head moving up and down while walking backwards away from target.  Perform 4 laps forwards and backwards.     Gaze Stabilization: Standing Feet Partial Heel-Toe    Feet in partial heel-toe position, keeping eyes fixed on target on wall __3__ feet away, tilt head down 15-30 and move head side to side for __60__ seconds.  Switch feet and repeat, 60 seconds.  SIT TO STAND: Pillow    Feet: shoulder-width on pillow. Lean chest forward. Raise hips and straighten knees to stand. _12__ reps per set, _2__ sets per day, __5_ days per week. Have chair or counter in front for support if needed.   Foot: Heel Raises    In sitting, keep toes on the ground, press through toes and lift heels up in the air.  Bring back down and repeat 12 times.    Resisted Toe Raises   Setup Begin sitting upright holding one end of a resistance band anchored under one foot and looped around the right.  Bend your foot upward, pulling against the resistance. Hold briefly then slowly lower your foot back down and repeat 12 times. Make sure to keep both heels on the ground during the exercise.   Resisted Ankle Turn Out   Setup Begin sitting in an upright position with a resistance loop around both feet, flat on the ground.  Lift your Right toes up and to the Right against the resistance of the band.  Try to keep your knee straight.  Repeat 12 times on the Right side    Single Leg - Eyes Open    Holding support with ONE HAND, lift right leg while maintaining balance over left leg.  Hold 10 seconds. Switch feet and repeat for 10 seconds, standing tall. Repeat __2__ times per leg, per session. Do __2__ sessions per day.   Feet Heel-Toe "Tandem"    HOLDING YOUR COUNTER TOP WITH ONE HAND - walk a straight line bringing one foot directly in front of the  other, SLOWLY - PAUSE AFTER EACH STEP Repeat for 4 LAPS, DOWN AND BACK EACH session. Do __2__ sessions per day.

## 2019-04-26 ENCOUNTER — Ambulatory Visit: Payer: Medicare PPO | Attending: Internal Medicine

## 2019-04-26 DIAGNOSIS — Z23 Encounter for immunization: Secondary | ICD-10-CM

## 2019-04-26 NOTE — Progress Notes (Signed)
   Covid-19 Vaccination Clinic  Name:  [name]    MRN: [id number] DOB: February 20, 1927  04/26/2019  [name] was observed post Covid-19 immunization for 15 minutes without incident. She was provided with Vaccine Information Sheet and instruction to access the V-Safe system.   [name] was instructed to call 911 with any severe reactions post vaccine: Marland Kitchen Difficulty breathing  . Swelling of face and throat  . A fast heartbeat  . A bad rash all over body  . Dizziness and weakness   Immunizations Administered    Name Date Dose VIS Date Route   Pfizer COVID-19 Vaccine 04/26/2019 12:30 PM 0.3 mL 12/28/2018 Intramuscular   Manufacturer: [place]   Lot: [id number]   [place]: [id number]

## 2019-04-29 ENCOUNTER — Ambulatory Visit: Payer: Medicare PPO | Admitting: Physical Therapy

## 2019-04-29 ENCOUNTER — Other Ambulatory Visit: Payer: Self-pay

## 2019-04-29 NOTE — Therapy (Signed)
[facility] [address] [place], [place], [address] Phone: [phone]   Fax:  [phone]  Patient Details  Name: [name] MRN: [id number] Date of Birth: Apr 02, 1927 Referring Provider:  Georgette Shell, MD  Encounter Date: 04/29/2019  Pt arrived stating she had a bad reaction to her COVID vaccine last week.  Reports 24 hours later she experienced fever, nausea, chills, headache, dizziness and cough.  Pt still experiencing weakness and malaise today and not sure if she would be able to do therapy.  Discussed with pt goal of session today to check LTG and recertify.  Pt does not feel she would be able to tolerate today or that it would not be productive.  Pt requested to wait until next session.    Rico Junker, PT, DPT 04/29/19    11:47 AM   [facility] [address] [place], [place], [address] Phone: [phone]   Fax:  [phone]

## 2019-04-30 DIAGNOSIS — M19012 Primary osteoarthritis, left shoulder: Secondary | ICD-10-CM | POA: Diagnosis not present

## 2019-04-30 DIAGNOSIS — M898X1 Other specified disorders of bone, shoulder: Secondary | ICD-10-CM | POA: Diagnosis not present

## 2019-04-30 DIAGNOSIS — M25812 Other specified joint disorders, left shoulder: Secondary | ICD-10-CM | POA: Diagnosis not present

## 2019-05-06 ENCOUNTER — Ambulatory Visit: Payer: Medicare PPO | Admitting: Physical Therapy

## 2019-05-06 ENCOUNTER — Encounter: Payer: Self-pay | Admitting: Physical Therapy

## 2019-05-06 ENCOUNTER — Other Ambulatory Visit: Payer: Self-pay

## 2019-05-06 DIAGNOSIS — M25571 Pain in right ankle and joints of right foot: Secondary | ICD-10-CM

## 2019-05-06 DIAGNOSIS — R296 Repeated falls: Secondary | ICD-10-CM

## 2019-05-06 DIAGNOSIS — R2681 Unsteadiness on feet: Secondary | ICD-10-CM

## 2019-05-06 DIAGNOSIS — R42 Dizziness and giddiness: Secondary | ICD-10-CM

## 2019-05-06 DIAGNOSIS — M6281 Muscle weakness (generalized): Secondary | ICD-10-CM | POA: Diagnosis not present

## 2019-05-06 DIAGNOSIS — R262 Difficulty in walking, not elsewhere classified: Secondary | ICD-10-CM

## 2019-05-07 DIAGNOSIS — Z86718 Personal history of other venous thrombosis and embolism: Secondary | ICD-10-CM | POA: Diagnosis not present

## 2019-05-07 DIAGNOSIS — Z8616 Personal history of COVID-19: Secondary | ICD-10-CM | POA: Diagnosis not present

## 2019-05-07 DIAGNOSIS — R000 Tachycardia, unspecified: Secondary | ICD-10-CM | POA: Diagnosis not present

## 2019-05-07 DIAGNOSIS — Z8673 Personal history of transient ischemic attack (TIA), and cerebral infarction without residual deficits: Secondary | ICD-10-CM | POA: Diagnosis not present

## 2019-05-07 NOTE — Therapy (Signed)
[facility] [address] [place], [place], [address] Phone: [phone]   Fax:  [phone]  Physical Therapy Treatment  Patient Details  Name: [name] MRN: [id number] Date of Birth: 14-May-1927 Referring Provider (PT): Referred by hospital physician - Georgette Shell, MD; will send cert to PCP Lavone Orn, MD   Encounter Date: 05/06/2019  PT End of Session - 05/07/19 1400    Visit Number  11    Number of Visits  27    Date for PT Re-Evaluation  07/06/19    Authorization Type  [employer]; $20 copay    Progress Note Due on Visit  20    PT Start Time  1107    PT Stop Time  1145    PT Time Calculation (min)  38 min    Activity Tolerance  Patient tolerated treatment well    Behavior During Therapy  [facility] for tasks assessed/performed       Past Medical History:  Diagnosis Date  . Abnormal Pap smear 1975-76  . Anemia    history of  . Arthritis    spine  . Breast cyst, left 1980  . Cystocele 2012  . Diverticulosis    with h/o Diverticulitis  . GERD (gastroesophageal reflux disease)   . H/O hypercholesterolemia   . H/O osteopenia   . H/O varicella   . H/O varicose veins   . Hx of Mumps   . Macular degeneration   . Seasonal allergies   . Urge incontinence 2012  . Urinary frequency 2010    Past Surgical History:  Procedure Laterality Date  . ABDOMINAL HYSTERECTOMY    . BLADDER SUSPENSION N/A 06/16/2016   Procedure: TRANSVAGINAL TAPE (TVT) PROCEDURE;  Surgeon: Everett Graff, MD;  Location: [facility];  Service: Gynecology;  Laterality: N/A;  . BREAST CYST ASPIRATION  1964  . COLONOSCOPY     Dr. Earlean Shawl  . CYSTOCELE REPAIR N/A 06/16/2016   Procedure: ANTERIOR REPAIR (CYSTOCELE);  Surgeon: Everett Graff, MD;  Location: [facility];  Service: Gynecology;  Laterality: N/A;  . CYSTOSCOPY N/A 06/16/2016   Procedure: CYSTOSCOPY;  Surgeon: Everett Graff, MD;  Location: [facility];  Service: Gynecology;  Laterality:  N/A;  see anterior repair  . Lower Extremity Venous Dopplers  01/09/2012   Right and left lower steroids: No evidence of thrombus or, thrombophlebitis; right and left GSV and SSV: No venous insufficiency. Normal exam.  . NM MYOVIEW LTD  11/2016   6.6 METS.  Reached 106% of max.  Heart rate.  Walk for 4:40 min.  EF 72%.  Normal blood pressure response.  upsloping ST segment depression, nonspecific.  Otherwise normal study.  LOW RISK.  No evidence of ischemia or infarction.  . TRANSTHORACIC ECHOCARDIOGRAM  01/2018   EF > 65%. LV size small. Gr 1 DD (normal for age).   Normal atrial size. Moderate Aortic Sclerosis without Stenosis. Moderate Mitral Annular Calcification with mild MR & no MS    There were no vitals filed for this visit.  Subjective Assessment - 05/06/19 1111    Subjective  Feeling better after vaccine, still having a little SOB.  Went to see orthopedic physician for shoulder pain - have her an injection and did not recommend surgery right now.  May refer to therapy later.    Pertinent History  DVT, TIA, anemia, arthritis, osteopenia, macular degeneration, dizziness and falls    Diagnostic tests  CT scan    Patient Stated Goals  To get  rid of the dizziness    Currently in Pain?  Yes         [facility] PT Assessment - 05/06/19 1115      Assessment   Medical Diagnosis  Dizziness, post COVID    Referring Provider (PT)  Referred by hospital physician - Georgette Shell, MD; will send cert to PCP Lavone Orn, MD    Onset Date/Surgical Date  01/18/19    Prior Therapy  yes neuro outpatient for dizziness      Precautions   Precautions  Other (comment);Fall    Precaution Comments  DVT, TIA, anemia, arthritis, osteopenia, macular degeneration, dizziness and falls      Prior Function   Level of Independence  Independent      Observation/Other Assessments   Focus on Therapeutic Outcomes (FOTO)   Increased to 60%    Other Surveys   Dizziness Handicap Inventory (DHI)    Dizziness  Handicap Inventory (DHI)   decreased to 16      Standardized Balance Assessment   Standardized Balance Assessment  10 meter walk test    10 Meter Walk  10.97 seconds or 2.98 ft/sec without AD      Functional Gait  Assessment   Gait assessed   Yes    Gait Level Surface  Walks 20 ft in less than 7 sec but greater than 5.5 sec, uses assistive device, slower speed, mild gait deviations, or deviates 6-10 in outside of the 12 in walkway width.    Change in Gait Speed  Able to change speed, demonstrates mild gait deviations, deviates 6-10 in outside of the 12 in walkway width, or no gait deviations, unable to achieve a major change in velocity, or uses a change in velocity, or uses an assistive device.    Gait with Horizontal Head Turns  Performs head turns smoothly with slight change in gait velocity (eg, minor disruption to smooth gait path), deviates 6-10 in outside 12 in walkway width, or uses an assistive device.    Gait with Vertical Head Turns  Performs task with slight change in gait velocity (eg, minor disruption to smooth gait path), deviates 6 - 10 in outside 12 in walkway width or uses assistive device    Gait and Pivot Turn  Pivot turns safely within 3 sec and stops quickly with no loss of balance.    Step Over Obstacle  Is able to step over one shoe box (4.5 in total height) without changing gait speed. No evidence of imbalance.    Gait with Narrow Base of Support  Ambulates 7-9 steps.    Gait with Eyes Closed  Walks 20 ft, slow speed, abnormal gait pattern, evidence for imbalance, deviates 10-15 in outside 12 in walkway width. Requires more than 9 sec to ambulate 20 ft.    Ambulating Backwards  Walks 20 ft, uses assistive device, slower speed, mild gait deviations, deviates 6-10 in outside 12 in walkway width.    Steps  Alternating feet, must use rail.    Total Score  20    FGA comment:  20/30 medium falls risk          Vestibular Assessment - 05/06/19 1128      Positional  Sensitivities   Nose to Right Knee  No dizziness   in standing   Right Knee to Sitting  No dizziness   in standing   Nose to Left Knee  No dizziness   in standing   Left Knee to Sitting  No  dizziness   in standing0   Head Turning x 5  No dizziness    Head Nodding x 5  Mild dizziness    Pivot Right in Standing  No dizziness    Pivot Left in Standing  No dizziness                       PT Education - 05/07/19 1359    Education Details  progress towards goals, plan for future visits    Person(s) Educated  Patient    Methods  Explanation    Comprehension  Verbalized understanding       PT Short Term Goals - 04/05/19 1133      PT SHORT TERM GOAL #1   Title  Pt will participate in further balance assessment with gait velocity and FGA - baselines TBD    Time  4    Period  Weeks    Status  Achieved    Target Date  04/04/19      PT SHORT TERM GOAL #2   Title  Pt will demonstrate independence with initial vestibular/endurance/strengthening/balance HEP    Time  4    Period  Weeks    Status  Achieved    Target Date  05/04/19      PT SHORT TERM GOAL #3   Title  Pt will demonstrate decreased motion sensitivity to looking up/down and bending down to the ground as indicated by decrease in MSQ to 1/5    Time  4    Period  Weeks    Status  Achieved    Target Date  04/04/19      PT SHORT TERM GOAL #4   Title  Pt will demonstrate improved standing/activity tolerance to be able to perform 10 minutes of ADL/house chores or exercise in standing    Baseline  standing 10 minutes or more to perform laundry and dishes    Time  4    Period  Weeks    Status  Achieved    Target Date  04/04/19      PT SHORT TERM GOAL #5   Title  Patient will ambulate x 500' outside over pavement with supervision and no LOB with visual scanning/head turns    Time  4    Period  Weeks    Status  Achieved    Target Date  04/04/19        PT Long Term Goals - 05/06/19 1113      PT LONG  TERM GOAL #1   Title  Pt will demonstrate independence with final HEP and will begin walking around neighborhood  (LTG due 05/04/19)    Baseline  independent with HEP, walking up/down driveway    Time  8    Period  Weeks    Status  Partially Met      PT LONG TERM GOAL #2   Title  Pt will demonstrate no motion sensitivity to vertical head movements and bending down to the ground    Time  8    Period  Weeks    Status  Partially Met      PT LONG TERM GOAL #3   Title  Pt will improve FGA by 4 points to indicate decreased falls risk in community    Baseline  12/30 > 20/30    Time  8    Period  Weeks    Status  Achieved      PT LONG TERM GOAL #4  Title  Pt gait velocity will increase to >/= 2.62 ft/sec    Baseline  2.4 > 2.89 ft/sec    Time  8    Period  Weeks    Status  Achieved      PT LONG TERM GOAL #5   Title  Pt will perform 6 minute walk test outside and ambulate >1300 feet over uneven pavement MOD I with mild SOB, no LOB with L/R turns    Baseline  >1200 feet inside for 6 minute walk with moderate DOE, intermittent veering/LOB    Time  8    Period  Weeks    Status  On-going      PT LONG TERM GOAL #6   Title  Pt will decrease DHI by 18 points and will improve overall function on FOTO to >= 74%    Baseline  44 DHI, 52% function FOTO > 16 DHI, 60% function on FOTO    Time  8    Period  Weeks    Status  Partially Met      New goals for re-certification: PT Short Term Goals - 05/07/19 1414      PT SHORT TERM GOAL #1   Title  = LTG (cert written for 8 weeks but anticipate D/C in 4 weeks)      PT Long Term Goals - 05/07/19 1415      PT LONG TERM GOAL #1   Title  Pt will demonstrate independence with final HEP and will begin walking around neighborhood    Baseline  independent with HEP, walking up/down driveway    Time  4    Period  Weeks    Status  Revised    Target Date  06/14/19      PT LONG TERM GOAL #2   Title  Pt will demonstrate no motion sensitivity to  vertical head movements and bending down to the ground    Time  4    Period  Weeks    Status  Partially Met    Target Date  06/14/19      PT LONG TERM GOAL #3   Title  Pt will improve FGA to >/= 24/30 to indicate decreased falls risk in community    Baseline  12/30 > 20/30    Time  4    Period  Weeks    Status  Revised    Target Date  06/14/19      PT LONG TERM GOAL #4   Title  Pt gait velocity will increase to >/= 3.0 ft/sec    Baseline  2.4 > 2.89 ft/sec    Time  4    Period  Weeks    Status  Revised    Target Date  06/14/19      PT LONG TERM GOAL #5   Title  Pt will perform 6 minute walk test outside and ambulate >1300 feet over uneven pavement MOD I with mild SOB, no LOB with L/R turns    Baseline  >1200 feet inside for 6 minute walk with moderate DOE, intermittent veering/LOB    Time  4    Period  Weeks    Status  On-going    Target Date  06/14/19      PT LONG TERM GOAL #6   Title  Pt will improve overall function on FOTO to >= 74%; DHI goal met    Baseline  44 DHI, 52% function FOTO > 16 DHI, 60% function on FOTO  Time  4    Period  Weeks    Status  Revised    Target Date  06/14/19           Plan - 05/06/19 1146    Clinical Impression Statement  Pt is making steady progress and has met 2/6 LTG, partially met 3 goals with 6 minute walk goal ongoing and will be re-assessed at next visit.  Pt demonstrates improvement in dizziness and motion sensitivity, improved balance, and improved gait velocity.  She remains at a moderate risk for falls and continues to present with impaired endurance, dynamic balance, disequilibrium and motion sensitivity to bending down to the ground and looking up.  Will benefit from continued skilled PT services to address these impairments, to maximize functional mobility independence and decrease falls risk.    Personal Factors and Comorbidities  Age;Comorbidity 3+;Finances;Past/Current Experience;Transportation    Comorbidities  DVT, TIA,  anemia, arthritis, osteopenia, macular degeneration, dizziness and falls    Examination-Activity Limitations  Bend;Locomotion Level;Reach Overhead;Stand    Examination-Participation Restrictions  Cleaning;Community Activity;Driving;Laundry;Meal Prep    Stability/Clinical Decision Making  Evolving/Moderate complexity    Rehab Potential  Good    PT Frequency  2x / week    PT Duration  8 weeks   Cert written for 8, anticipate D/C In 4   PT Treatment/Interventions  ADLs/Self Care Home Management;DME Instruction;Gait training;Stair training;Functional mobility training;Therapeutic activities;Therapeutic exercise;Balance training;Neuromuscular re-education;Patient/family education;Vestibular;Cryotherapy;Moist Heat;Orthotic Fit/Training;Manual techniques;Passive range of motion    PT Next Visit Plan  6 minute walk outside; stairs alternating sequence - decrease UE support;  balance with eyes closed. Bending down to floor to pick up items-add in rotation; Gait with trekking poles outside longer distances, different surfaces.  Start on Nustep each visit- 8 min, level 4 or 5 resistance for aerobic conditioning.  tandem stance/gait; Ankle strengthening and balance training on balance beams, compliant surfaces, gait with head turns, narrow BOS    Consulted and Agree with Plan of Care  Patient       Patient will benefit from skilled therapeutic intervention in order to improve the following deficits and impairments:  Decreased balance, Decreased endurance, Decreased range of motion, Decreased strength, Difficulty walking, Dizziness, Pain  Visit Diagnosis: Dizziness and giddiness  Unsteadiness on feet  Difficulty in walking, not elsewhere classified  Muscle weakness (generalized)  Repeated falls  Pain in right ankle and joints of right foot     Problem List Patient Active Problem List   Diagnosis Date Noted  . Dizziness 02/14/2019  . Chest pain 02/14/2019  . DVT, lower extremity, distal,  chronic ([place]) 12/24/2018  . Palpitations 05/30/2018  . Irregular heartbeat 03/02/2018  . TIA (transient ischemic attack) 02/15/2018  . Urinary, incontinence, stress female 06/16/2016  . Right carotid bruit 12/23/2014  . Exertional dyspnea 12/24/2013  . Bilateral arm numbness and tingling while sleeping - and shortly after waking 12/24/2013  . Poor balance 12/24/2013  . Edema of left lower extremity 12/21/2012  . Essential hypertension; borderline   . H/O hypercholesterolemia   . Osteoporosis - of the spine 09/22/2011    Rico Junker, PT, DPT 05/07/19    2:18 PM    [facility] [address] [place], [place], [address] Phone: [phone]   Fax:  [phone]  Name: [name] MRN: [id number] Date of Birth: 1927/02/19

## 2019-05-13 ENCOUNTER — Encounter: Payer: Medicare PPO | Admitting: Physical Therapy

## 2019-05-20 ENCOUNTER — Ambulatory Visit: Payer: Medicare PPO | Attending: Internal Medicine

## 2019-05-20 ENCOUNTER — Ambulatory Visit: Payer: Medicare PPO | Admitting: Physical Therapy

## 2019-05-20 ENCOUNTER — Other Ambulatory Visit: Payer: Self-pay

## 2019-05-20 DIAGNOSIS — Z23 Encounter for immunization: Secondary | ICD-10-CM

## 2019-05-20 NOTE — Progress Notes (Signed)
   Covid-19 Vaccination Clinic  Name:  [name]    MRN: [id number] DOB: 18-Dec-1927  05/20/2019  Ms. [name] was observed post Covid-19 immunization for 15 minutes without incident. She was provided with Vaccine Information Sheet and instruction to access the V-Safe system.   Ms. [name] was instructed to call 911 with any severe reactions post vaccine: Marland Kitchen Difficulty breathing  . Swelling of face and throat  . A fast heartbeat  . A bad rash all over body  . Dizziness and weakness   Immunizations Administered    Name Date Dose VIS Date Route   Pfizer COVID-19 Vaccine 05/20/2019  3:17 PM 0.3 mL 03/13/2018 Intramuscular   Manufacturer: [employer]   Lot: [id number]   [place]: [id number]

## 2019-05-24 ENCOUNTER — Ambulatory Visit: Payer: Medicare PPO | Admitting: Physical Therapy

## 2019-05-27 ENCOUNTER — Other Ambulatory Visit: Payer: Self-pay

## 2019-05-27 ENCOUNTER — Ambulatory Visit: Payer: Medicare PPO | Attending: Neurology | Admitting: Physical Therapy

## 2019-05-27 ENCOUNTER — Encounter: Payer: Self-pay | Admitting: Physical Therapy

## 2019-05-27 DIAGNOSIS — R296 Repeated falls: Secondary | ICD-10-CM | POA: Diagnosis not present

## 2019-05-27 DIAGNOSIS — R2681 Unsteadiness on feet: Secondary | ICD-10-CM

## 2019-05-27 DIAGNOSIS — R262 Difficulty in walking, not elsewhere classified: Secondary | ICD-10-CM | POA: Diagnosis not present

## 2019-05-27 DIAGNOSIS — M6281 Muscle weakness (generalized): Secondary | ICD-10-CM | POA: Diagnosis not present

## 2019-05-27 DIAGNOSIS — R42 Dizziness and giddiness: Secondary | ICD-10-CM

## 2019-05-27 NOTE — Therapy (Signed)
[facility] [address] [place], [place], [address] Phone: [phone]   Fax:  [phone]  Physical Therapy Treatment  Patient Details  Name: [name] MRN: [id number] Date of Birth: April 13, 1927 Referring Provider (PT): Referred by hospital physician - Georgette Shell, MD; will send cert to PCP Lavone Orn, MD   Encounter Date: 05/27/2019  PT End of Session - 05/27/19 1235    Visit Number  12    Number of Visits  27    Date for PT Re-Evaluation  07/06/19    Authorization Type  [employer]; $20 copay    Progress Note Due on Visit  20    PT Start Time  1155    PT Stop Time  1236    PT Time Calculation (min)  41 min    Activity Tolerance  Patient tolerated treatment well    Behavior During Therapy  [facility] for tasks assessed/performed       Past Medical History:  Diagnosis Date  . Abnormal Pap smear 1975-76  . Anemia    history of  . Arthritis    spine  . Breast cyst, left 1980  . Cystocele 2012  . Diverticulosis    with h/o Diverticulitis  . GERD (gastroesophageal reflux disease)   . H/O hypercholesterolemia   . H/O osteopenia   . H/O varicella   . H/O varicose veins   . Hx of Mumps   . Macular degeneration   . Seasonal allergies   . Urge incontinence 2012  . Urinary frequency 2010    Past Surgical History:  Procedure Laterality Date  . ABDOMINAL HYSTERECTOMY    . BLADDER SUSPENSION N/A 06/16/2016   Procedure: TRANSVAGINAL TAPE (TVT) PROCEDURE;  Surgeon: Everett Graff, MD;  Location: [facility];  Service: Gynecology;  Laterality: N/A;  . BREAST CYST ASPIRATION  1964  . COLONOSCOPY     Dr. Earlean Shawl  . CYSTOCELE REPAIR N/A 06/16/2016   Procedure: ANTERIOR REPAIR (CYSTOCELE);  Surgeon: Everett Graff, MD;  Location: [facility];  Service: Gynecology;  Laterality: N/A;  . CYSTOSCOPY N/A 06/16/2016   Procedure: CYSTOSCOPY;  Surgeon: Everett Graff, MD;  Location: [facility];  Service: Gynecology;  Laterality:  N/A;  see anterior repair  . Lower Extremity Venous Dopplers  01/09/2012   Right and left lower steroids: No evidence of thrombus or, thrombophlebitis; right and left GSV and SSV: No venous insufficiency. Normal exam.  . NM MYOVIEW LTD  11/2016   6.6 METS.  Reached 106% of max.  Heart rate.  Walk for 4:40 min.  EF 72%.  Normal blood pressure response.  upsloping ST segment depression, nonspecific.  Otherwise normal study.  LOW RISK.  No evidence of ischemia or infarction.  . TRANSTHORACIC ECHOCARDIOGRAM  01/2018   EF > 65%. LV size small. Gr 1 DD (normal for age).   Normal atrial size. Moderate Aortic Sclerosis without Stenosis. Moderate Mitral Annular Calcification with mild MR & no MS    There were no vitals filed for this visit.  Subjective Assessment - 05/27/19 1158    Subjective  Missed therapy last week due to difficulty breathing and vertigo.  Vertigo is improved.  Still having a hard time with breathing.  She is having a hard time with allergies.    Pertinent History  DVT, TIA, anemia, arthritis, osteopenia, macular degeneration, dizziness and falls    Diagnostic tests  CT scan    Patient Stated Goals  To get rid of the dizziness  Currently in Pain?  No/denies         [facility] PT Assessment - 05/27/19 1202      6 Minute Walk- Baseline   6 Minute Walk- Baseline  yes    BP (mmHg)  117/70    HR (bpm)  83    02 Sat (%RA)  98 %    Modified Borg Scale for Dyspnea  0- Nothing at all    Perceived Rate of Exertion (Borg)  9- very light      6 Minute walk- Post Test   6 Minute Walk Post Test  yes    BP (mmHg)  145/67    HR (bpm)  79    02 Sat (%RA)  91 %    Modified Borg Scale for Dyspnea  2- Mild shortness of breath    Perceived Rate of Exertion (Borg)  13- Somewhat hard      6 minute walk test results    Aerobic Endurance Distance Walked  1354    Endurance additional comments  no AD, performed inside again due to allergies and difficulty breathing outside right now                    [facility] Adult PT Treatment/Exercise - 05/27/19 1228      Therapeutic Activites    Therapeutic Activities  Other Therapeutic Activities    Other Therapeutic Activities  pursed lip breathing training with 3 second inhale, 6 second exhale x 5-6 reps      Vestibular Treatment/Exercise - 05/27/19 1219      Vestibular Treatment/Exercise   Vestibular Treatment Provided  Gaze    Gaze Exercises  X1 Viewing Horizontal;X1 Viewing Vertical      X1 Viewing Horizontal   Foot Position  staggered stance, R and then L foot forwards    Reps  2    Comments  60 seconds each, mild symptoms - pt cued to breathe      X1 Viewing Vertical   Foot Position  staggered stance with L and R foot forwards    Reps  2    Comments  mild symptoms, pt cued to breathe            PT Education - 05/27/19 1235    Education Details  progress with 6 minute walk, pursed lip breathing    Person(s) Educated  Patient    Methods  Explanation;Demonstration    Comprehension  Verbalized understanding;Returned demonstration       PT Short Term Goals - 05/07/19 1414      PT SHORT TERM GOAL #1   Title  = LTG (cert written for 8 weeks but anticipate D/C in 4 weeks)        PT Long Term Goals - 05/07/19 1415      PT LONG TERM GOAL #1   Title  Pt will demonstrate independence with final HEP and will begin walking around neighborhood    Baseline  independent with HEP, walking up/down driveway    Time  4    Period  Weeks    Status  Revised    Target Date  06/14/19      PT LONG TERM GOAL #2   Title  Pt will demonstrate no motion sensitivity to vertical head movements and bending down to the ground    Time  4    Period  Weeks    Status  Partially Met    Target Date  06/14/19      PT  LONG TERM GOAL #3   Title  Pt will improve FGA to >/= 24/30 to indicate decreased falls risk in community    Baseline  12/30 > 20/30    Time  4    Period  Weeks    Status  Revised    Target Date   06/14/19      PT LONG TERM GOAL #4   Title  Pt gait velocity will increase to >/= 3.0 ft/sec    Baseline  2.4 > 2.89 ft/sec    Time  4    Period  Weeks    Status  Revised    Target Date  06/14/19      PT LONG TERM GOAL #5   Title  Pt will perform 6 minute walk test outside and ambulate >1300 feet over uneven pavement MOD I with mild SOB, no LOB with L/R turns    Baseline  >1200 feet inside for 6 minute walk with moderate DOE, intermittent veering/LOB    Time  4    Period  Weeks    Status  On-going    Target Date  06/14/19      PT LONG TERM GOAL #6   Title  Pt will improve overall function on FOTO to >= 74%; DHI goal met    Baseline  44 DHI, 52% function FOTO > 16 DHI, 60% function on FOTO    Time  4    Period  Weeks    Status  Revised    Target Date  06/14/19            Plan - 05/27/19 1443    Clinical Impression Statement  Pt able to participate in 6 minute walk today but not able to outside due to allergies and breathing; pt did demonstrate improvement in gait speed and endurance today with an increase in distance and decreased Borg RPE.  Rest of session focused on vesitbular training and pursed lip breathing training.  No changes to gaze adaptation at this time as pt continues to present with mild symptoms.  Will continue to progress as pt is able to tolerate and will continue to work towards walking outside.    Personal Factors and Comorbidities  Age;Comorbidity 3+;Finances;Past/Current Experience;Transportation    Comorbidities  DVT, TIA, anemia, arthritis, osteopenia, macular degeneration, dizziness and falls    Examination-Activity Limitations  Bend;Locomotion Level;Reach Overhead;Stand    Examination-Participation Restrictions  Cleaning;Community Activity;Driving;Laundry;Meal Prep    Stability/Clinical Decision Making  Evolving/Moderate complexity    Rehab Potential  Good    PT Frequency  2x / week    PT Duration  8 weeks   Cert written for 8, anticipate D/C In 4    PT Treatment/Interventions  ADLs/Self Care Home Management;DME Instruction;Gait training;Stair training;Functional mobility training;Therapeutic activities;Therapeutic exercise;Balance training;Neuromuscular re-education;Patient/family education;Vestibular;Cryotherapy;Moist Heat;Orthotic Fit/Training;Manual techniques;Passive range of motion    PT Next Visit Plan  Gait outside if able with breathing?  Progress x1 viewing. stairs alternating sequence - decrease UE support;  balance with eyes closed. Bending down to floor to pick up items-add in rotation; Gait with trekking poles outside longer distances, different surfaces.  Start on Nustep each visit- 8 min, level 4 or 5 resistance for aerobic conditioning.  tandem stance/gait; Ankle strengthening and balance training on balance beams, compliant surfaces, gait with head turns, narrow BOS    Consulted and Agree with Plan of Care  Patient       Patient will benefit from skilled therapeutic intervention in order to improve the following  deficits and impairments:  Decreased balance, Decreased endurance, Decreased range of motion, Decreased strength, Difficulty walking, Dizziness, Pain  Visit Diagnosis: Dizziness and giddiness  Unsteadiness on feet  Difficulty in walking, not elsewhere classified  Muscle weakness (generalized)  Repeated falls     Problem List Patient Active Problem List   Diagnosis Date Noted  . Dizziness 02/14/2019  . Chest pain 02/14/2019  . DVT, lower extremity, distal, chronic ([place]) 12/24/2018  . Palpitations 05/30/2018  . Irregular heartbeat 03/02/2018  . TIA (transient ischemic attack) 02/15/2018  . Urinary, incontinence, stress female 06/16/2016  . Right carotid bruit 12/23/2014  . Exertional dyspnea 12/24/2013  . Bilateral arm numbness and tingling while sleeping - and shortly after waking 12/24/2013  . Poor balance 12/24/2013  . Edema of left lower extremity 12/21/2012  . Essential hypertension; borderline    . H/O hypercholesterolemia   . Osteoporosis - of the spine 09/22/2011    Rico Junker, PT, DPT 05/27/19    2:47 PM    [facility] [address] [place], [place], [address] Phone: [phone]   Fax:  [phone]  Name: [name] MRN: [id number] Date of Birth: 1927-02-17

## 2019-05-30 DIAGNOSIS — N3941 Urge incontinence: Secondary | ICD-10-CM | POA: Diagnosis not present

## 2019-05-30 DIAGNOSIS — N390 Urinary tract infection, site not specified: Secondary | ICD-10-CM | POA: Diagnosis not present

## 2019-05-30 DIAGNOSIS — R350 Frequency of micturition: Secondary | ICD-10-CM | POA: Diagnosis not present

## 2019-05-31 ENCOUNTER — Other Ambulatory Visit: Payer: Self-pay

## 2019-05-31 ENCOUNTER — Encounter: Payer: Self-pay | Admitting: Physical Therapy

## 2019-05-31 ENCOUNTER — Ambulatory Visit: Payer: Medicare PPO | Admitting: Physical Therapy

## 2019-05-31 DIAGNOSIS — R42 Dizziness and giddiness: Secondary | ICD-10-CM

## 2019-05-31 DIAGNOSIS — R296 Repeated falls: Secondary | ICD-10-CM | POA: Diagnosis not present

## 2019-05-31 DIAGNOSIS — R262 Difficulty in walking, not elsewhere classified: Secondary | ICD-10-CM

## 2019-05-31 DIAGNOSIS — R2681 Unsteadiness on feet: Secondary | ICD-10-CM

## 2019-05-31 DIAGNOSIS — M6281 Muscle weakness (generalized): Secondary | ICD-10-CM

## 2019-05-31 NOTE — Therapy (Signed)
[facility] [address] [place], [place], [address] Phone: [phone]   Fax:  [phone]  Physical Therapy Treatment  Patient Details  Name: [name] MRN: [id number] Date of Birth: 1927-02-10 Referring Provider (PT): Referred by hospital physician - Georgette Shell, MD; will send cert to PCP Lavone Orn, MD   Encounter Date: 05/31/2019  PT End of Session - 05/31/19 1153    Visit Number  13    Number of Visits  27    Date for PT Re-Evaluation  07/06/19    Authorization Type  [employer]; $20 copay    Progress Note Due on Visit  20    PT Start Time  1105    PT Stop Time  1145    PT Time Calculation (min)  40 min    Activity Tolerance  Patient tolerated treatment well    Behavior During Therapy  [facility] for tasks assessed/performed       Past Medical History:  Diagnosis Date  . Abnormal Pap smear 1975-76  . Anemia    history of  . Arthritis    spine  . Breast cyst, left 1980  . Cystocele 2012  . Diverticulosis    with h/o Diverticulitis  . GERD (gastroesophageal reflux disease)   . H/O hypercholesterolemia   . H/O osteopenia   . H/O varicella   . H/O varicose veins   . Hx of Mumps   . Macular degeneration   . Seasonal allergies   . Urge incontinence 2012  . Urinary frequency 2010    Past Surgical History:  Procedure Laterality Date  . ABDOMINAL HYSTERECTOMY    . BLADDER SUSPENSION N/A 06/16/2016   Procedure: TRANSVAGINAL TAPE (TVT) PROCEDURE;  Surgeon: Everett Graff, MD;  Location: [facility];  Service: Gynecology;  Laterality: N/A;  . BREAST CYST ASPIRATION  1964  . COLONOSCOPY     Dr. Earlean Shawl  . CYSTOCELE REPAIR N/A 06/16/2016   Procedure: ANTERIOR REPAIR (CYSTOCELE);  Surgeon: Everett Graff, MD;  Location: [facility];  Service: Gynecology;  Laterality: N/A;  . CYSTOSCOPY N/A 06/16/2016   Procedure: CYSTOSCOPY;  Surgeon: Everett Graff, MD;  Location: [facility];  Service: Gynecology;  Laterality:  N/A;  see anterior repair  . Lower Extremity Venous Dopplers  01/09/2012   Right and left lower steroids: No evidence of thrombus or, thrombophlebitis; right and left GSV and SSV: No venous insufficiency. Normal exam.  . NM MYOVIEW LTD  11/2016   6.6 METS.  Reached 106% of max.  Heart rate.  Walk for 4:40 min.  EF 72%.  Normal blood pressure response.  upsloping ST segment depression, nonspecific.  Otherwise normal study.  LOW RISK.  No evidence of ischemia or infarction.  . TRANSTHORACIC ECHOCARDIOGRAM  01/2018   EF > 65%. LV size small. Gr 1 DD (normal for age).   Normal atrial size. Moderate Aortic Sclerosis without Stenosis. Moderate Mitral Annular Calcification with mild MR & no MS    There were no vitals filed for this visit.  Subjective Assessment - 05/31/19 1108    Subjective  Feeling good, breathing better - has been practicing pursed lip breathing.  Was a little fatigued after last session but no significant issues.  Dizziness is pretty good today.    Pertinent History  DVT, TIA, anemia, arthritis, osteopenia, macular degeneration, dizziness and falls    Diagnostic tests  CT scan    Patient Stated Goals  To get rid of the dizziness  Currently in Pain?  Yes    Pain Score  2     Pain Location  Head    Pain Descriptors / Indicators  Headache                        [facility] Adult PT Treatment/Exercise - 05/31/19 1109      Ambulation/Gait   Ambulation/Gait  Yes    Ambulation/Gait Assistance  5: Supervision    Ambulation/Gait Assistance Details  outside over paved surfaces to focus on return to walking program outside; mild shuffling of gait; mild SOB    Ambulation Distance (Feet)  2000 Feet    Assistive device  None    Gait Pattern  Step-through pattern;Poor foot clearance - left;Poor foot clearance - right    Ambulation Surface  Unlevel;Outdoor;Paved          Balance Exercises - 05/31/19 1124      Balance Exercises: Standing   Other Standing Exercises   To focus on bending down to the ground for habituation: performed reaching down to stool to pick up cones and place on counter 6 reps x 2 sets (up to counter and then back to stool); progressed to floor <> counter x 6 reps x 2 in front of patient; progressed to floor <> counter to the L for 90 deg turn x 6 reps x 2 and then also with counter to R side x 6 reps x 2.  Repeated sequence standing on foam with close supervision-min A          PT Short Term Goals - 05/07/19 1414      PT SHORT TERM GOAL #1   Title  = LTG (cert written for 8 weeks but anticipate D/C in 4 weeks)        PT Long Term Goals - 05/07/19 1415      PT LONG TERM GOAL #1   Title  Pt will demonstrate independence with final HEP and will begin walking around neighborhood    Baseline  independent with HEP, walking up/down driveway    Time  4    Period  Weeks    Status  Revised    Target Date  06/14/19      PT LONG TERM GOAL #2   Title  Pt will demonstrate no motion sensitivity to vertical head movements and bending down to the ground    Time  4    Period  Weeks    Status  Partially Met    Target Date  06/14/19      PT LONG TERM GOAL #3   Title  Pt will improve FGA to >/= 24/30 to indicate decreased falls risk in community    Baseline  12/30 > 20/30    Time  4    Period  Weeks    Status  Revised    Target Date  06/14/19      PT LONG TERM GOAL #4   Title  Pt gait velocity will increase to >/= 3.0 ft/sec    Baseline  2.4 > 2.89 ft/sec    Time  4    Period  Weeks    Status  Revised    Target Date  06/14/19      PT LONG TERM GOAL #5   Title  Pt will perform 6 minute walk test outside and ambulate >1300 feet over uneven pavement MOD I with mild SOB, no LOB with L/R turns    Baseline  >1200  feet inside for 6 minute walk with moderate DOE, intermittent veering/LOB    Time  4    Period  Weeks    Status  On-going    Target Date  06/14/19      PT LONG TERM GOAL #6   Title  Pt will improve overall function on  FOTO to >= 74%; DHI goal met    Baseline  44 DHI, 52% function FOTO > 16 DHI, 60% function on FOTO    Time  4    Period  Weeks    Status  Revised    Target Date  06/14/19            Plan - 05/31/19 1155    Clinical Impression Statement  Pt demonstrates improvements in functional endurance and decreased motion sensitivity and improved balance today.  Pt able to ambulate 2000' or .37 miles at moderate pace with mild fatigue and SOB but Sp02 100%.  Pt also able to perform repeated bending<>stand on solid and compliant surfaces and with 90 deg turns without any symptoms of dizziness and no LOB.  Intermittent rest breaks for pursed lip breathing.  Will continue to advance towards LTG.    Personal Factors and Comorbidities  Age;Comorbidity 3+;Finances;Past/Current Experience;Transportation    Comorbidities  DVT, TIA, anemia, arthritis, osteopenia, macular degeneration, dizziness and falls    Examination-Activity Limitations  Bend;Locomotion Level;Reach Overhead;Stand    Examination-Participation Restrictions  Cleaning;Community Activity;Driving;Laundry;Meal Prep    Stability/Clinical Decision Making  Evolving/Moderate complexity    Rehab Potential  Good    PT Frequency  2x / week    PT Duration  8 weeks   Cert written for 8, anticipate D/C In 4   PT Treatment/Interventions  ADLs/Self Care Home Management;DME Instruction;Gait training;Stair training;Functional mobility training;Therapeutic activities;Therapeutic exercise;Balance training;Neuromuscular re-education;Patient/family education;Vestibular;Cryotherapy;Moist Heat;Orthotic Fit/Training;Manual techniques;Passive range of motion    PT Next Visit Plan  Progress x1 viewing. stairs alternating sequence - decrease UE support;  balance with eyes closed. Bending down to floor to pick up items-add in rotation; Gait with trekking poles outside longer distances, different surfaces.  Start on Nustep each visit- 8 min, level 4 or 5 resistance for  aerobic conditioning.  tandem stance/gait; Ankle strengthening and balance training on balance beams, compliant surfaces, gait with head turns, narrow BOS    Consulted and Agree with Plan of Care  Patient       Patient will benefit from skilled therapeutic intervention in order to improve the following deficits and impairments:  Decreased balance, Decreased endurance, Decreased range of motion, Decreased strength, Difficulty walking, Dizziness, Pain  Visit Diagnosis: Dizziness and giddiness  Unsteadiness on feet  Difficulty in walking, not elsewhere classified  Muscle weakness (generalized)  Repeated falls     Problem List Patient Active Problem List   Diagnosis Date Noted  . Dizziness 02/14/2019  . Chest pain 02/14/2019  . DVT, lower extremity, distal, chronic ([place]) 12/24/2018  . Palpitations 05/30/2018  . Irregular heartbeat 03/02/2018  . TIA (transient ischemic attack) 02/15/2018  . Urinary, incontinence, stress female 06/16/2016  . Right carotid bruit 12/23/2014  . Exertional dyspnea 12/24/2013  . Bilateral arm numbness and tingling while sleeping - and shortly after waking 12/24/2013  . Poor balance 12/24/2013  . Edema of left lower extremity 12/21/2012  . Essential hypertension; borderline   . H/O hypercholesterolemia   . Osteoporosis - of the spine 09/22/2011    Rico Junker, PT, DPT 05/31/19    12:02 PM    [facility]  [facility] [address] [place], [place], [address] Phone: [phone]   Fax:  [phone]  Name: [name] MRN: [id number] Date of Birth: July 19, 1927

## 2019-06-03 ENCOUNTER — Encounter: Payer: Self-pay | Admitting: Physical Therapy

## 2019-06-03 ENCOUNTER — Other Ambulatory Visit: Payer: Self-pay

## 2019-06-03 ENCOUNTER — Ambulatory Visit: Payer: Medicare PPO | Admitting: Physical Therapy

## 2019-06-03 DIAGNOSIS — R42 Dizziness and giddiness: Secondary | ICD-10-CM | POA: Diagnosis not present

## 2019-06-03 DIAGNOSIS — R296 Repeated falls: Secondary | ICD-10-CM

## 2019-06-03 DIAGNOSIS — M6281 Muscle weakness (generalized): Secondary | ICD-10-CM | POA: Diagnosis not present

## 2019-06-03 DIAGNOSIS — R262 Difficulty in walking, not elsewhere classified: Secondary | ICD-10-CM

## 2019-06-03 DIAGNOSIS — R2681 Unsteadiness on feet: Secondary | ICD-10-CM

## 2019-06-03 NOTE — Therapy (Signed)
[facility] [address] [place], [place], [address] Phone: [phone]   Fax:  [phone]  Physical Therapy Treatment  Patient Details  Name: [name] MRN: [id number] Date of Birth: 07/20/1927 Referring Provider (PT): Referred by hospital physician - Mathews, Elizabeth G, MD; will send cert to PCP John Griffin, MD   Encounter Date: 06/03/2019  PT End of Session - 06/03/19 1234    Visit Number  14    Number of Visits  27    Date for PT Re-Evaluation  07/06/19    Authorization Type  [employer]; $20 copay    Progress Note Due on Visit  20    PT Start Time  1155    PT Stop Time  1234    PT Time Calculation (min)  39 min    Activity Tolerance  Patient tolerated treatment well    Behavior During Therapy  WFL for tasks assessed/performed       Past Medical History:  Diagnosis Date  . Abnormal Pap smear 1975-76  . Anemia    history of  . Arthritis    spine  . Breast cyst, left 1980  . Cystocele 2012  . Diverticulosis    with h/o Diverticulitis  . GERD (gastroesophageal reflux disease)   . H/O hypercholesterolemia   . H/O osteopenia   . H/O varicella   . H/O varicose veins   . Hx of Mumps   . Macular degeneration   . Seasonal allergies   . Urge incontinence 2012  . Urinary frequency 2010    Past Surgical History:  Procedure Laterality Date  . ABDOMINAL HYSTERECTOMY    . BLADDER SUSPENSION N/A 06/16/2016   Procedure: TRANSVAGINAL TAPE (TVT) PROCEDURE;  Surgeon: Roberts, Angela, MD;  Location: [facility];  Service: Gynecology;  Laterality: N/A;  . BREAST CYST ASPIRATION  1964  . COLONOSCOPY     Dr. Medoff  . CYSTOCELE REPAIR N/A 06/16/2016   Procedure: ANTERIOR REPAIR (CYSTOCELE);  Surgeon: Roberts, Angela, MD;  Location: [facility];  Service: Gynecology;  Laterality: N/A;  . CYSTOSCOPY N/A 06/16/2016   Procedure: CYSTOSCOPY;  Surgeon: Roberts, Angela, MD;  Location: [facility];  Service: Gynecology;  Laterality:  N/A;  see anterior repair  . Lower Extremity Venous Dopplers  01/09/2012   Right and left lower steroids: No evidence of thrombus or, thrombophlebitis; right and left GSV and SSV: No venous insufficiency. Normal exam.  . NM MYOVIEW LTD  11/2016   6.6 METS.  Reached 106% of max.  Heart rate.  Walk for 4:40 min.  EF 72%.  Normal blood pressure response.  upsloping ST segment depression, nonspecific.  Otherwise normal study.  LOW RISK.  No evidence of ischemia or infarction.  . TRANSTHORACIC ECHOCARDIOGRAM  01/2018   EF > 65%. LV size small. Gr 1 DD (normal for age).   Normal atrial size. Moderate Aortic Sclerosis without Stenosis. Moderate Mitral Annular Calcification with mild MR & no MS    There were no vitals filed for this visit.  Subjective Assessment - 06/03/19 1157    Subjective  Had a good weekend.  Didn't feel bad after Friday's session.  Did her breathing exercises this morning.    Pertinent History  DVT, TIA, anemia, arthritis, osteopenia, macular degeneration, dizziness and falls    Diagnostic tests  CT scan    Patient Stated Goals  To get rid of the dizziness    Currently in Pain?  No/denies                          [facility] PT Treatment/Exercise - 06/03/19 1202      Therapeutic Activites    Therapeutic Activities  Other Therapeutic Activities    Other Therapeutic Activities  Educated on beginning to progress walking program at home outside of driveway.  Discussed going 1/2 a block and then turning around and then slowly progressing distance based on symptoms and confidence.      Neuro Re-ed    Neuro Re-ed Details   Continued to focus on habituation to reaching down to the floor <> stand reaching down for cones and placing them on a table while standing on ramp.  Performed 2 sets x 6 reps facing down ramp and then facing up ramp.  Added compliant mat over ramp and repeated x 2 sets each direction with supervision-min A overall.      Knee/Hip Exercises: Aerobic    Nustep  Level 4 resistance x 5 minutes at moderate intensity for aerobic conditioning.      Vestibular Treatment/Exercise - 06/03/19 1227      Vestibular Treatment/Exercise   Vestibular Treatment Provided  Gaze    Gaze Exercises  X1 Viewing Horizontal;X1 Viewing Vertical      X1 Viewing Horizontal   Foot Position  feet together standing on ramp, facing down ramp    Reps  1    Comments  60 seconds mild sway      X1 Viewing Vertical   Foot Position  feet together standing on ramp, facing down ramp    Reps  1    Comments  60 seconds mild sway            PT Education - 06/03/19 1234    Education Details  progressing walking program at home    Person(s) Educated  Patient    Methods  Explanation    Comprehension  Verbalized understanding       PT Short Term Goals - 05/07/19 1414      PT SHORT TERM GOAL #1   Title  = LTG (cert written for 8 weeks but anticipate D/C in 4 weeks)        PT Long Term Goals - 05/07/19 1415      PT LONG TERM GOAL #1   Title  Pt will demonstrate independence with final HEP and will begin walking around neighborhood    Baseline  independent with HEP, walking up/down driveway    Time  4    Period  Weeks    Status  Revised    Target Date  06/14/19      PT LONG TERM GOAL #2   Title  Pt will demonstrate no motion sensitivity to vertical head movements and bending down to the ground    Time  4    Period  Weeks    Status  Partially Met    Target Date  06/14/19      PT LONG TERM GOAL #3   Title  Pt will improve FGA to >/= 24/30 to indicate decreased falls risk in community    Baseline  12/30 > 20/30    Time  4    Period  Weeks    Status  Revised    Target Date  06/14/19      PT LONG TERM GOAL #4   Title  Pt gait velocity will increase to >/= 3.0 ft/sec    Baseline  2.4 > 2.89 ft/sec    Time  4    Period  Weeks    Status  Revised  Target Date  06/14/19      PT LONG TERM GOAL #5   Title  Pt will perform 6 minute walk test  outside and ambulate >1300 feet over uneven pavement MOD I with mild SOB, no LOB with L/R turns    Baseline  >1200 feet inside for 6 minute walk with moderate DOE, intermittent veering/LOB    Time  4    Period  Weeks    Status  On-going    Target Date  06/14/19      PT LONG TERM GOAL #6   Title  Pt will improve overall function on FOTO to >= 74%; DHI goal met    Baseline  44 DHI, 52% function FOTO > 16 DHI, 60% function on FOTO    Time  4    Period  Weeks    Status  Revised    Target Date  06/14/19            Plan - 06/03/19 1435    Clinical Impression Statement  Pt continues to demonstrate improvement in aerobic endurance and pt feels ready to begin to progress home walking program to beyond her driveway.  Due to improvement in dizziness and decreased motion sensitivity patient able to progress habituation exercise with bending down to performing on inclines and compliant surface on incline for increased balance challenge.  Pt only required close supervision-min A for balance and only reported very mild dizziness; no anterior LOB.  Also able to progress x1 viewing to inclined surface.  Pt only required one sitting rest break and was able to use pursed lip breathing when feeling out of breath.  Will continue to progress towards LTG.    Personal Factors and Comorbidities  Age;Comorbidity 3+;Finances;Past/Current Experience;Transportation    Comorbidities  DVT, TIA, anemia, arthritis, osteopenia, macular degeneration, dizziness and falls    Examination-Activity Limitations  Bend;Locomotion Level;Reach Overhead;Stand    Examination-Participation Restrictions  Cleaning;Community Activity;Driving;Laundry;Meal Prep    Stability/Clinical Decision Making  Evolving/Moderate complexity    Rehab Potential  Good    PT Frequency  2x / week    PT Duration  8 weeks   Cert written for 8, anticipate D/C In 4   PT Treatment/Interventions  ADLs/Self Care Home Management;DME Instruction;Gait  training;Stair training;Functional mobility training;Therapeutic activities;Therapeutic exercise;Balance training;Neuromuscular re-education;Patient/family education;Vestibular;Cryotherapy;Moist Heat;Orthotic Fit/Training;Manual techniques;Passive range of motion    PT Next Visit Plan  Start to discuss D/C.  Progress x1 viewing to ramp. stairs alternating sequence - decrease UE support;  balance with eyes closed. On Ramp - Bending down to floor to pick up items-add in rotation; Gait with trekking poles outside longer distances, different surfaces.  Start on Nustep each visit- 8 min, level 4 or 5 resistance for aerobic conditioning.  tandem stance/gait; Ankle strengthening and balance training on balance beams, compliant surfaces, gait with head turns, narrow BOS    Consulted and Agree with Plan of Care  Patient       Patient will benefit from skilled therapeutic intervention in order to improve the following deficits and impairments:  Decreased balance, Decreased endurance, Decreased range of motion, Decreased strength, Difficulty walking, Dizziness, Pain  Visit Diagnosis: Dizziness and giddiness  Unsteadiness on feet  Difficulty in walking, not elsewhere classified  Muscle weakness (generalized)  Repeated falls     Problem List Patient Active Problem List   Diagnosis Date Noted  . Dizziness 02/14/2019  . Chest pain 02/14/2019  . DVT, lower extremity, distal, chronic (HCC) 12/24/2018  . Palpitations 05/30/2018  .   Irregular heartbeat 03/02/2018  . TIA (transient ischemic attack) 02/15/2018  . Urinary, incontinence, stress female 06/16/2016  . Right carotid bruit 12/23/2014  . Exertional dyspnea 12/24/2013  . Bilateral arm numbness and tingling while sleeping - and shortly after waking 12/24/2013  . Poor balance 12/24/2013  . Edema of left lower extremity 12/21/2012  . Essential hypertension; borderline   . H/O hypercholesterolemia   . Osteoporosis - of the spine 09/22/2011     Audra F Potter, PT, DPT 06/03/19    2:41 PM    [facility] [address] [place], [place], [address] Phone: [phone]   Fax:  [phone]  Name: [name] MRN: [id number] Date of Birth: 12/16/1927   

## 2019-06-07 ENCOUNTER — Other Ambulatory Visit: Payer: Self-pay

## 2019-06-07 ENCOUNTER — Encounter: Payer: Self-pay | Admitting: Physical Therapy

## 2019-06-07 ENCOUNTER — Ambulatory Visit: Payer: Medicare PPO | Admitting: Physical Therapy

## 2019-06-07 DIAGNOSIS — R2681 Unsteadiness on feet: Secondary | ICD-10-CM

## 2019-06-07 DIAGNOSIS — R42 Dizziness and giddiness: Secondary | ICD-10-CM | POA: Diagnosis not present

## 2019-06-07 DIAGNOSIS — R262 Difficulty in walking, not elsewhere classified: Secondary | ICD-10-CM | POA: Diagnosis not present

## 2019-06-07 DIAGNOSIS — M6281 Muscle weakness (generalized): Secondary | ICD-10-CM | POA: Diagnosis not present

## 2019-06-07 DIAGNOSIS — R296 Repeated falls: Secondary | ICD-10-CM | POA: Diagnosis not present

## 2019-06-07 NOTE — Therapy (Signed)
[facility] [address] [place], [place], [address] Phone: [phone]   Fax:  [phone]  Physical Therapy Treatment  Patient Details  Name: [name] MRN: [id number] Date of Birth: Mar 28, 1927 Referring Provider (PT): Referred by hospital physician - Georgette Shell, MD; will send cert to PCP Lavone Orn, MD   Encounter Date: 06/07/2019  PT End of Session - 06/07/19 1157    Visit Number  15    Number of Visits  27    Date for PT Re-Evaluation  07/06/19    Authorization Type  [employer]; $20 copay    Progress Note Due on Visit  20    PT Start Time  1106    PT Stop Time  1148    PT Time Calculation (min)  42 min    Activity Tolerance  Patient tolerated treatment well    Behavior During Therapy  [facility] for tasks assessed/performed       Past Medical History:  Diagnosis Date  . Abnormal Pap smear 1975-76  . Anemia    history of  . Arthritis    spine  . Breast cyst, left 1980  . Cystocele 2012  . Diverticulosis    with h/o Diverticulitis  . GERD (gastroesophageal reflux disease)   . H/O hypercholesterolemia   . H/O osteopenia   . H/O varicella   . H/O varicose veins   . Hx of Mumps   . Macular degeneration   . Seasonal allergies   . Urge incontinence 2012  . Urinary frequency 2010    Past Surgical History:  Procedure Laterality Date  . ABDOMINAL HYSTERECTOMY    . BLADDER SUSPENSION N/A 06/16/2016   Procedure: TRANSVAGINAL TAPE (TVT) PROCEDURE;  Surgeon: Everett Graff, MD;  Location: [facility];  Service: Gynecology;  Laterality: N/A;  . BREAST CYST ASPIRATION  1964  . COLONOSCOPY     Dr. Earlean Shawl  . CYSTOCELE REPAIR N/A 06/16/2016   Procedure: ANTERIOR REPAIR (CYSTOCELE);  Surgeon: Everett Graff, MD;  Location: [facility];  Service: Gynecology;  Laterality: N/A;  . CYSTOSCOPY N/A 06/16/2016   Procedure: CYSTOSCOPY;  Surgeon: Everett Graff, MD;  Location: [facility];  Service: Gynecology;  Laterality:  N/A;  see anterior repair  . Lower Extremity Venous Dopplers  01/09/2012   Right and left lower steroids: No evidence of thrombus or, thrombophlebitis; right and left GSV and SSV: No venous insufficiency. Normal exam.  . NM MYOVIEW LTD  11/2016   6.6 METS.  Reached 106% of max.  Heart rate.  Walk for 4:40 min.  EF 72%.  Normal blood pressure response.  upsloping ST segment depression, nonspecific.  Otherwise normal study.  LOW RISK.  No evidence of ischemia or infarction.  . TRANSTHORACIC ECHOCARDIOGRAM  01/2018   EF > 65%. LV size small. Gr 1 DD (normal for age).   Normal atrial size. Moderate Aortic Sclerosis without Stenosis. Moderate Mitral Annular Calcification with mild MR & no MS    There were no vitals filed for this visit.  Subjective Assessment - 06/07/19 1109    Subjective  Went walking outside the driveway; did the length of her drive way on the street.  Did well.    Pertinent History  DVT, TIA, anemia, arthritis, osteopenia, macular degeneration, dizziness and falls    Diagnostic tests  CT scan    Patient Stated Goals  To get rid of the dizziness    Currently in Pain?  No/denies  [facility] Adult PT Treatment/Exercise - 06/07/19 1124      Ambulation/Gait   Stairs  Yes    Stairs Assistance  4: Min guard    Stair Management Technique  No rails;Alternating pattern;Step to pattern;Forwards    Number of Stairs  8    Height of Stairs  6    Gait Comments  On stairs without UE support pt able to perform alternating sequence to ascend with supervision.  Required min A to descend and self selected step to sequence due to ankle ROM and balance      Therapeutic Activites    Therapeutic Activities  Other Therapeutic Activities    Other Therapeutic Activities  Educated on planning for D/C.  After next week will allow pt 2-3 week break and see how patient responds.  Will have pt return on 6/18 to reassess and determine if more visits are needed or if pt  will be ready for D/C      Knee/Hip Exercises: Standing   Lateral Step Up  Right;Left;2 sets;5 reps;Hand Hold: 0;Step Height: 6"    Lateral Step Up Limitations  with min A for balance    Forward Step Up  Right;Left;1 set;10 reps;Hand Hold: 0;Step Height: 6"    Forward Step Up Limitations  min A for balance    Step Down  Left;Right;2 sets;5 reps;Hand Hold: 0;Step Height: 6"    Step Down Limitations  min A for balance      Vestibular Treatment/Exercise - 06/07/19 1138      Vestibular Treatment/Exercise   Vestibular Treatment Provided  Habituation    Habituation Exercises  Standing Horizontal Head Turns;Standing Vertical Head Turns      Standing Horizontal Head Turns   Number of Reps   10    Symptom Description   x 2 sets while standing on mat on ramp, 10 facing up and then 10 facing down ramp marching in place.  Min A      Standing Vertical Head Turns   Number of Reps   10    Symptom Description   x 2 sets while standing on mat on ramp, 10 facing up and then 10 facing down ramp marching in place.  Min A              PT Short Term Goals - 05/07/19 1414      PT SHORT TERM GOAL #1   Title  = LTG (cert written for 8 weeks but anticipate D/C in 4 weeks)        PT Long Term Goals - 05/07/19 1415      PT LONG TERM GOAL #1   Title  Pt will demonstrate independence with final HEP and will begin walking around neighborhood    Baseline  independent with HEP, walking up/down driveway    Time  4    Period  Weeks    Status  Revised    Target Date  06/14/19      PT LONG TERM GOAL #2   Title  Pt will demonstrate no motion sensitivity to vertical head movements and bending down to the ground    Time  4    Period  Weeks    Status  Partially Met    Target Date  06/14/19      PT LONG TERM GOAL #3   Title  Pt will improve FGA to >/= 24/30 to indicate decreased falls risk in community    Baseline  12/30 > 20/30    Time  4    Period  Weeks    Status  Revised    Target Date   06/14/19      PT LONG TERM GOAL #4   Title  Pt gait velocity will increase to >/= 3.0 ft/sec    Baseline  2.4 > 2.89 ft/sec    Time  4    Period  Weeks    Status  Revised    Target Date  06/14/19      PT LONG TERM GOAL #5   Title  Pt will perform 6 minute walk test outside and ambulate >1300 feet over uneven pavement MOD I with mild SOB, no LOB with L/R turns    Baseline  >1200 feet inside for 6 minute walk with moderate DOE, intermittent veering/LOB    Time  4    Period  Weeks    Status  On-going    Target Date  06/14/19      PT LONG TERM GOAL #6   Title  Pt will improve overall function on FOTO to >= 74%; [place] goal met    Baseline  44 DHI, 52% function FOTO > 16 DHI, 60% function on FOTO    Time  4    Period  Weeks    Status  Revised    Target Date  06/14/19            Plan - 06/07/19 1200    Clinical Impression Statement  Began discussion about D/C and made a plan for final visits.  Continued with dynamic/functional strengthening and balance training for stair negotiation and balance on compliant surfaces and with head movements.  Pt continues to demonstrate decreased LE functional strength for eccentric control, decreased balance on uneven surfaces and with head turns/visual scanning.  Will continue to address in order to progress towards LTG.    Personal Factors and Comorbidities  Age;Comorbidity 3+;Finances;Past/Current Experience;Transportation    Comorbidities  DVT, TIA, anemia, arthritis, osteopenia, macular degeneration, dizziness and falls    Examination-Activity Limitations  Bend;Locomotion Level;Reach Overhead;Stand    Examination-Participation Restrictions  Cleaning;Community Activity;Driving;Laundry;Meal Prep    Stability/Clinical Decision Making  Evolving/Moderate complexity    Rehab Potential  Good    PT Frequency  2x / week    PT Duration  8 weeks   Cert written for 8, anticipate D/C In 4   PT Treatment/Interventions  ADLs/Self Care Home Management;DME  Instruction;Gait training;Stair training;Functional mobility training;Therapeutic activities;Therapeutic exercise;Balance training;Neuromuscular re-education;Patient/family education;Vestibular;Cryotherapy;Moist Heat;Orthotic Fit/Training;Manual techniques;Passive range of motion    PT Next Visit Plan  Stairs alternating sequence- decrease UE support when descending;  balance with eyes closed. On Ramp - Bending down to floor to pick up items-add in rotation; gait 2 laps around building. tandem stance/gait; Ankle strengthening and balance training on balance beams, compliant surfaces, gait with head turns, narrow BOS    Consulted and Agree with Plan of Care  Patient       Patient will benefit from skilled therapeutic intervention in order to improve the following deficits and impairments:  Decreased balance, Decreased endurance, Decreased range of motion, Decreased strength, Difficulty walking, Dizziness, Pain  Visit Diagnosis: Dizziness and giddiness  Unsteadiness on feet  Difficulty in walking, not elsewhere classified  Muscle weakness (generalized)  Repeated falls     Problem List Patient Active Problem List   Diagnosis Date Noted  . Dizziness 02/14/2019  . Chest pain 02/14/2019  . DVT, lower extremity, distal, chronic ([facility]) 12/24/2018  . Palpitations 05/30/2018  . Irregular heartbeat 03/02/2018  .  TIA (transient ischemic attack) 02/15/2018  . Urinary, incontinence, stress female 06/16/2016  . Right carotid bruit 12/23/2014  . Exertional dyspnea 12/24/2013  . Bilateral arm numbness and tingling while sleeping - and shortly after waking 12/24/2013  . Poor balance 12/24/2013  . Edema of left lower extremity 12/21/2012  . Essential hypertension; borderline   . H/O hypercholesterolemia   . Osteoporosis - of the spine 09/22/2011    Rico Junker, PT, DPT 06/07/19    12:05 PM    [facility] [address] [place], [place], [address] Phone: [phone]   Fax:  [phone]  Name: [name] MRN: [id number] Date of Birth: 04-12-1927

## 2019-06-10 ENCOUNTER — Ambulatory Visit: Payer: Medicare PPO | Admitting: Physical Therapy

## 2019-06-10 DIAGNOSIS — I350 Nonrheumatic aortic (valve) stenosis: Secondary | ICD-10-CM | POA: Diagnosis not present

## 2019-06-10 DIAGNOSIS — R030 Elevated blood-pressure reading, without diagnosis of hypertension: Secondary | ICD-10-CM | POA: Diagnosis not present

## 2019-06-10 DIAGNOSIS — F418 Other specified anxiety disorders: Secondary | ICD-10-CM | POA: Diagnosis not present

## 2019-06-10 DIAGNOSIS — R0602 Shortness of breath: Secondary | ICD-10-CM | POA: Diagnosis not present

## 2019-06-14 ENCOUNTER — Ambulatory Visit: Payer: Medicare PPO | Admitting: Physical Therapy

## 2019-06-14 ENCOUNTER — Other Ambulatory Visit: Payer: Self-pay

## 2019-06-14 ENCOUNTER — Encounter: Payer: Self-pay | Admitting: Physical Therapy

## 2019-06-14 DIAGNOSIS — M6281 Muscle weakness (generalized): Secondary | ICD-10-CM

## 2019-06-14 DIAGNOSIS — R42 Dizziness and giddiness: Secondary | ICD-10-CM | POA: Diagnosis not present

## 2019-06-14 DIAGNOSIS — R296 Repeated falls: Secondary | ICD-10-CM | POA: Diagnosis not present

## 2019-06-14 DIAGNOSIS — R262 Difficulty in walking, not elsewhere classified: Secondary | ICD-10-CM | POA: Diagnosis not present

## 2019-06-14 DIAGNOSIS — R2681 Unsteadiness on feet: Secondary | ICD-10-CM

## 2019-06-14 NOTE — Therapy (Signed)
[facility] [address] [place], [place], [address] Phone: [phone]   Fax:  [phone]  Physical Therapy Treatment  Patient Details  Name: [name] MRN: [id number] Date of Birth: [birth year] Referring Provider (PT): Referred by hospital physician - Georgette Shell, MD; will send cert to PCP Lavone Orn, MD   Encounter Date: 06/14/2019  PT End of Session - 06/14/19 1158    Visit Number  16    Number of Visits  27    Date for PT Re-Evaluation  07/06/19    Authorization Type  [employer]; $20 copay    Progress Note Due on Visit  20    PT Start Time  1105    PT Stop Time  1145    PT Time Calculation (min)  40 min    Activity Tolerance  Patient tolerated treatment well    Behavior During Therapy  [facility] for tasks assessed/performed       Past Medical History:  Diagnosis Date  . Abnormal Pap smear 1975-76  . Anemia    history of  . Arthritis    spine  . Breast cyst, left 1980  . Cystocele 2012  . Diverticulosis    with h/o Diverticulitis  . GERD (gastroesophageal reflux disease)   . H/O hypercholesterolemia   . H/O osteopenia   . H/O varicella   . H/O varicose veins   . Hx of Mumps   . Macular degeneration   . Seasonal allergies   . Urge incontinence 2012  . Urinary frequency 2010    Past Surgical History:  Procedure Laterality Date  . ABDOMINAL HYSTERECTOMY    . BLADDER SUSPENSION N/A 06/16/2016   Procedure: TRANSVAGINAL TAPE (TVT) PROCEDURE;  Surgeon: Everett Graff, MD;  Location: [facility];  Service: Gynecology;  Laterality: N/A;  . BREAST CYST ASPIRATION  1964  . COLONOSCOPY     Dr. Earlean Shawl  . CYSTOCELE REPAIR N/A 06/16/2016   Procedure: ANTERIOR REPAIR (CYSTOCELE);  Surgeon: Everett Graff, MD;  Location: [facility];  Service: Gynecology;  Laterality: N/A;  . CYSTOSCOPY N/A 06/16/2016   Procedure: CYSTOSCOPY;  Surgeon: Everett Graff, MD;  Location: [facility];  Service: Gynecology;  Laterality:  N/A;  see anterior repair  . Lower Extremity Venous Dopplers  01/09/2012   Right and left lower steroids: No evidence of thrombus or, thrombophlebitis; right and left GSV and SSV: No venous insufficiency. Normal exam.  . NM MYOVIEW LTD  11/2016   6.6 METS.  Reached 106% of max.  Heart rate.  Walk for 4:40 min.  EF 72%.  Normal blood pressure response.  upsloping ST segment depression, nonspecific.  Otherwise normal study.  LOW RISK.  No evidence of ischemia or infarction.  . TRANSTHORACIC ECHOCARDIOGRAM  01/2018   EF > 65%. LV size small. Gr 1 DD (normal for age).   Normal atrial size. Moderate Aortic Sclerosis without Stenosis. Moderate Mitral Annular Calcification with mild MR & no MS    There were no vitals filed for this visit.  Subjective Assessment - 06/14/19 1112    Subjective  Had a hard time breathing earlier this week; went to the doctor and they told her lungs were clear but she continues to have increased drainage.  Is still walking every day around her block.    Pertinent History  DVT, TIA, anemia, arthritis, osteopenia, macular degeneration, dizziness and falls    Diagnostic tests  CT scan    Patient Stated Goals  To get rid  of the dizziness    Currently in Pain?  No/denies         [facility] PT Assessment - 06/14/19 1114      Assessment   Medical Diagnosis  Dizziness, post COVID    Referring Provider (PT)  Referred by hospital physician - Georgette Shell, MD; will send cert to PCP Lavone Orn, MD    Onset Date/Surgical Date  01/18/19      Standardized Balance Assessment   Standardized Balance Assessment  10 meter walk test    10 Meter Walk  10.13 seconds or 3.23 ft/sec      Functional Gait  Assessment   Gait assessed   Yes    Gait Level Surface  Walks 20 ft in less than 7 sec but greater than 5.5 sec, uses assistive device, slower speed, mild gait deviations, or deviates 6-10 in outside of the 12 in walkway width.    Change in Gait Speed  Able to smoothly change  walking speed without loss of balance or gait deviation. Deviate no more than 6 in outside of the 12 in walkway width.    Gait with Horizontal Head Turns  Performs head turns smoothly with slight change in gait velocity (eg, minor disruption to smooth gait path), deviates 6-10 in outside 12 in walkway width, or uses an assistive device.    Gait with Vertical Head Turns  Performs head turns with no change in gait. Deviates no more than 6 in outside 12 in walkway width.    Gait and Pivot Turn  Pivot turns safely within 3 sec and stops quickly with no loss of balance.    Step Over Obstacle  Is able to step over one shoe box (4.5 in total height) without changing gait speed. No evidence of imbalance.    Gait with Narrow Base of Support  Ambulates 4-7 steps.    Gait with Eyes Closed  Walks 20 ft, slow speed, abnormal gait pattern, evidence for imbalance, deviates 10-15 in outside 12 in walkway width. Requires more than 9 sec to ambulate 20 ft.    Ambulating Backwards  Walks 20 ft, uses assistive device, slower speed, mild gait deviations, deviates 6-10 in outside 12 in walkway width.    Steps  Alternating feet, no rail.    Total Score  22    FGA comment:  22/30          Vestibular Assessment - 06/14/19 1127      Positional Sensitivities   Nose to Right Knee  No dizziness    Right Knee to Sitting  No dizziness    Nose to Left Knee  No dizziness    Left Knee to Sitting  No dizziness    Head Turning x 5  No dizziness    Head Nodding x 5  No dizziness    Pivot Right in Standing  No dizziness    Pivot Left in Standing  Lightheadedness         Gaze Stabilization: Walking Toward Target    In your hallway: walk toward target on wall __6__ feet away, moving head up for 3 steps, down for three steps.  THEN walk straight backwards without head movements but taking long steps.  Perform 3 laps down your hallway.  Walk forwards turning head to the left for 3 steps, right for 3 steps.  Walk  straight backwards without head movements.  Perform 3 laps this way.   Gaze Stabilization: Standing Feet Partial Heel-Toe    Feet  in partial heel-toe position, keeping eyes fixed on target on wall __3__ feet away, tilt head down 15-30 and move head side to side for __60__ seconds.  Switch feet and repeat, 60 seconds.   Toe / Heel Raise    Gently rock back on heels and raise toes. Then rock forward on toes and raise heels. Repeat sequence __10__ times per session. Do __2__ sessions per week.  Copyright  VHI. All rights reserved.     Single Leg - Eyes Open    Holding support with ONE FINGER, lift right leg while maintaining balance over left leg.  Hold 10 seconds. Switch feet and repeat for 10 seconds, standing tall. Repeat __2__ times per leg, per session. Do __2__ sessions per day. You can make it harder by standing on a pillow to strengthen your ankles   Feet Heel-Toe "Tandem"    HOLDING YOUR COUNTER TOP WITH ONE HAND - walk a straight line bringing one foot directly in front of the other, SLOWLY - PAUSE AFTER EACH STEP Repeat for 4 LAPS, DOWN AND BACK EACH session. Do __2__ sessions per day.   PT Education - 06/14/19 1158    Education Details  revised HEP to perform while taking 2 week break from therapy    Person(s) Educated  Patient    Methods  Explanation;Demonstration;Handout    Comprehension  Verbalized understanding;Returned demonstration       PT Short Term Goals - 05/07/19 1414      PT SHORT TERM GOAL #1   Title  = LTG (cert written for 8 weeks but anticipate D/C in 4 weeks)        PT Long Term Goals - 06/14/19 1125      PT LONG TERM GOAL #1   Title  Pt will demonstrate independence with final HEP and will begin walking around neighborhood    Baseline  independent with HEP, walking a block away from house    Time  4    Period  Weeks    Status  Achieved      PT LONG TERM GOAL #2   Title  Pt will demonstrate no motion sensitivity to vertical  head movements and bending down to the ground    Baseline  met - no dizziness with these movements, still slight dizziness with pivoting quickly    Time  4    Period  Weeks    Status  Partially Met      PT LONG TERM GOAL #3   Title  Pt will improve FGA to >/= 24/30 to indicate decreased falls risk in community    Baseline  22/30    Time  4    Period  Weeks    Status  Partially Met      PT LONG TERM GOAL #4   Title  Pt gait velocity will increase to >/= 3.0 ft/sec    Baseline  3.2 ft/sec    Time  4    Period  Weeks    Status  Achieved      PT LONG TERM GOAL #5   Title  Pt will perform 6 minute walk test outside and ambulate >1300 feet over uneven pavement MOD I with mild SOB, no LOB with L/R turns    Baseline  1354 inside due to the heat MOD I, mild SOB    Time  4    Period  Weeks    Status  Achieved      PT LONG TERM GOAL #6  Title  Pt will improve overall function on FOTO to >= 74%; DHI goal met    Baseline  44 DHI, 52% function FOTO > 16 DHI, 60% function on FOTO    Time  4    Period  Weeks    Status  On-going            Plan - 06/14/19 1159    Clinical Impression Statement  Performed assessment of progress towards LTG.  Pt is making steady progress and has met 3/6 LTG.  One goal partially met and one goal ongoing.  Pt is demonstrating improvement in overall endurance and has started walking around the block in her neighborhood; pt continues to have intermittent breathing issues (DOE) but does maintain Sp02 >95% with exertion.  Pt demonstrates decreased motion sensitivity, reports decreased dizziness and demonstrates decreased falls risk and improved balance.  Pt to take a break from therapy for 3 weeks and will return on 6/18 to assess if pt has been able to maintain gains and make progress with HEP or if pt would benefit from continued skilled PT services.  If so, will recert at that time.    Personal Factors and Comorbidities  Age;Comorbidity 3+;Finances;Past/Current  Experience;Transportation    Comorbidities  DVT, TIA, anemia, arthritis, osteopenia, macular degeneration, dizziness and falls    Examination-Activity Limitations  Bend;Locomotion Level;Reach Overhead;Stand    Examination-Participation Restrictions  Cleaning;Community Activity;Driving;Laundry;Meal Prep    Stability/Clinical Decision Making  Evolving/Moderate complexity    Rehab Potential  Good    PT Frequency  2x / week    PT Duration  8 weeks   Cert written for 8, anticipate D/C In 4   PT Treatment/Interventions  ADLs/Self Care Home Management;DME Instruction;Gait training;Stair training;Functional mobility training;Therapeutic activities;Therapeutic exercise;Balance training;Neuromuscular re-education;Patient/family education;Vestibular;Cryotherapy;Moist Heat;Orthotic Fit/Training;Manual techniques;Passive range of motion    PT Next Visit Plan  Pt returns on 6/18.  Reassess balance and endurance.  Need more PT?  If so, recert.  If not, recert and d/c    Consulted and Agree with Plan of Care  Patient       Patient will benefit from skilled therapeutic intervention in order to improve the following deficits and impairments:  Decreased balance, Decreased endurance, Decreased range of motion, Decreased strength, Difficulty walking, Dizziness, Pain  Visit Diagnosis: Dizziness and giddiness  Unsteadiness on feet  Difficulty in walking, not elsewhere classified  Muscle weakness (generalized)  Repeated falls     Problem List Patient Active Problem List   Diagnosis Date Noted  . Dizziness 02/14/2019  . Chest pain 02/14/2019  . DVT, lower extremity, distal, chronic ([place]) 12/24/2018  . Palpitations 05/30/2018  . Irregular heartbeat 03/02/2018  . TIA (transient ischemic attack) 02/15/2018  . Urinary, incontinence, stress female 06/16/2016  . Right carotid bruit 12/23/2014  . Exertional dyspnea 12/24/2013  . Bilateral arm numbness and tingling while sleeping - and shortly after waking  12/24/2013  . Poor balance 12/24/2013  . Edema of left lower extremity 12/21/2012  . Essential hypertension; borderline   . H/O hypercholesterolemia   . Osteoporosis - of the spine 09/22/2011    Rico Junker, PT, DPT 06/14/19    12:08 PM    [facility] [address] [place], [place], [address] Phone: [phone]   Fax:  [phone]  Name: [name] MRN: [id number] Date of Birth: 18-Jul-1927

## 2019-06-14 NOTE — Patient Instructions (Signed)
  Gaze Stabilization: Walking Toward Target    In your hallway: walk toward target on wall __6__ feet away, moving head up for 3 steps, down for three steps.  THEN walk straight backwards without head movements but taking long steps.  Perform 3 laps down your hallway.  Walk forwards turning head to the left for 3 steps, right for 3 steps.  Walk straight backwards without head movements.  Perform 3 laps this way.   Gaze Stabilization: Standing Feet Partial Heel-Toe    Feet in partial heel-toe position, keeping eyes fixed on target on wall __3__ feet away, tilt head down 15-30 and move head side to side for __60__ seconds.  Switch feet and repeat, 60 seconds.   Toe / Heel Raise    Gently rock back on heels and raise toes. Then rock forward on toes and raise heels. Repeat sequence __10__ times per session. Do __2__ sessions per week.  Copyright  VHI. All rights reserved.     Single Leg - Eyes Open    Holding support with ONE FINGER, lift right leg while maintaining balance over left leg.  Hold 10 seconds. Switch feet and repeat for 10 seconds, standing tall. Repeat __2__ times per leg, per session. Do __2__ sessions per day. You can make it harder by standing on a pillow to strengthen your ankles   Feet Heel-Toe "Tandem"    HOLDING YOUR COUNTER TOP WITH ONE HAND - walk a straight line bringing one foot directly in front of the other, SLOWLY - PAUSE AFTER EACH STEP Repeat for 4 LAPS, DOWN AND BACK EACH session. Do __2__ sessions per day.

## 2019-06-21 DIAGNOSIS — J301 Allergic rhinitis due to pollen: Secondary | ICD-10-CM | POA: Diagnosis not present

## 2019-07-01 ENCOUNTER — Encounter (INDEPENDENT_AMBULATORY_CARE_PROVIDER_SITE_OTHER): Payer: Medicare Other | Admitting: Ophthalmology

## 2019-07-05 ENCOUNTER — Ambulatory Visit: Payer: Medicare PPO | Attending: Neurology | Admitting: Physical Therapy

## 2019-07-05 ENCOUNTER — Encounter: Payer: Self-pay | Admitting: Physical Therapy

## 2019-07-05 ENCOUNTER — Other Ambulatory Visit: Payer: Self-pay

## 2019-07-05 DIAGNOSIS — R296 Repeated falls: Secondary | ICD-10-CM

## 2019-07-05 DIAGNOSIS — R2681 Unsteadiness on feet: Secondary | ICD-10-CM | POA: Diagnosis not present

## 2019-07-05 DIAGNOSIS — R42 Dizziness and giddiness: Secondary | ICD-10-CM

## 2019-07-05 DIAGNOSIS — M6281 Muscle weakness (generalized): Secondary | ICD-10-CM | POA: Diagnosis not present

## 2019-07-05 DIAGNOSIS — R262 Difficulty in walking, not elsewhere classified: Secondary | ICD-10-CM | POA: Diagnosis not present

## 2019-07-05 NOTE — Therapy (Signed)
[facility] [address] [place], [place], [address] Phone: [phone]   Fax:  [phone]  Physical Therapy Treatment and D/C Summary  Patient Details  Name: [name] MRN: [id number] Date of Birth: 1927-12-23 Referring Provider (PT): Referred by hospital physician - Georgette Shell, MD; will send cert to PCP Lavone Orn, MD   Encounter Date: 07/05/2019   PT End of Session - 07/05/19 1240    Visit Number 17    Number of Visits 27    Date for PT Re-Evaluation 07/06/19    Authorization Type [employer]; $20 copay    Progress Note Due on Visit 20    PT Start Time 1025    PT Stop Time 1105    PT Time Calculation (min) 40 min    Activity Tolerance Patient tolerated treatment well    Behavior During Therapy [facility] for tasks assessed/performed           Past Medical History:  Diagnosis Date  . Abnormal Pap smear 1975-76  . Anemia    history of  . Arthritis    spine  . Breast cyst, left 1980  . Cystocele 2012  . Diverticulosis    with h/o Diverticulitis  . GERD (gastroesophageal reflux disease)   . H/O hypercholesterolemia   . H/O osteopenia   . H/O varicella   . H/O varicose veins   . Hx of Mumps   . Macular degeneration   . Seasonal allergies   . Urge incontinence 2012  . Urinary frequency 2010    Past Surgical History:  Procedure Laterality Date  . ABDOMINAL HYSTERECTOMY    . BLADDER SUSPENSION N/A 06/16/2016   Procedure: TRANSVAGINAL TAPE (TVT) PROCEDURE;  Surgeon: Everett Graff, MD;  Location: [facility];  Service: Gynecology;  Laterality: N/A;  . BREAST CYST ASPIRATION  1964  . COLONOSCOPY     Dr. Earlean Shawl  . CYSTOCELE REPAIR N/A 06/16/2016   Procedure: ANTERIOR REPAIR (CYSTOCELE);  Surgeon: Everett Graff, MD;  Location: [facility];  Service: Gynecology;  Laterality: N/A;  . CYSTOSCOPY N/A 06/16/2016   Procedure: CYSTOSCOPY;  Surgeon: Everett Graff, MD;  Location: [facility];  Service: Gynecology;   Laterality: N/A;  see anterior repair  . Lower Extremity Venous Dopplers  01/09/2012   Right and left lower steroids: No evidence of thrombus or, thrombophlebitis; right and left GSV and SSV: No venous insufficiency. Normal exam.  . NM MYOVIEW LTD  11/2016   6.6 METS.  Reached 106% of max.  Heart rate.  Walk for 4:40 min.  EF 72%.  Normal blood pressure response.  upsloping ST segment depression, nonspecific.  Otherwise normal study.  LOW RISK.  No evidence of ischemia or infarction.  . TRANSTHORACIC ECHOCARDIOGRAM  01/2018   EF > 65%. LV size small. Gr 1 DD (normal for age).   Normal atrial size. Moderate Aortic Sclerosis without Stenosis. Moderate Mitral Annular Calcification with mild MR & no MS    There were no vitals filed for this visit.   Subjective Assessment - 07/05/19 1030    Subjective Has been 3 weeks since she was here.  Still having a hard time with bending down and standing on one leg to put pants on.  Has been walking more and is up to .5 miles.  Stops half way to do breathing exercises.  Breathing is better.  No dizziness over the past few weeks.    Pertinent History DVT, TIA, anemia, arthritis, osteopenia, macular degeneration, dizziness and  falls    Diagnostic tests CT scan    Patient Stated Goals To get rid of the dizziness    Currently in Pain? No/denies              [facility] PT Assessment - 07/05/19 1036      Assessment   Medical Diagnosis Dizziness, post COVID    Referring Provider (PT) Referred by hospital physician - Georgette Shell, MD; will send cert to PCP Lavone Orn, MD    Onset Date/Surgical Date 01/18/19    Prior Therapy yes neuro outpatient for dizziness      Precautions   Precautions Other (comment);Fall    Precaution Comments DVT, TIA, anemia, arthritis, osteopenia, macular degeneration, dizziness and falls      Prior Function   Level of Independence Independent      Observation/Other Assessments   Focus on Therapeutic Outcomes (FOTO)  79%     Other Surveys  Dizziness Handicap Inventory (DHI)    Dizziness Handicap Inventory [facility])  4      Functional Gait  Assessment   Gait assessed  Yes    Gait Level Surface Walks 20 ft in less than 5.5 sec, no assistive devices, good speed, no evidence for imbalance, normal gait pattern, deviates no more than 6 in outside of the 12 in walkway width.    Change in Gait Speed Able to smoothly change walking speed without loss of balance or gait deviation. Deviate no more than 6 in outside of the 12 in walkway width.    Gait with Horizontal Head Turns Performs head turns smoothly with no change in gait. Deviates no more than 6 in outside 12 in walkway width    Gait with Vertical Head Turns Performs head turns with no change in gait. Deviates no more than 6 in outside 12 in walkway width.    Gait and Pivot Turn Pivot turns safely within 3 sec and stops quickly with no loss of balance.    Step Over Obstacle Is able to step over 2 stacked shoe boxes taped together (9 in total height) without changing gait speed. No evidence of imbalance.    Gait with Narrow Base of Support Ambulates 4-7 steps.    Gait with Eyes Closed Walks 20 ft, slow speed, abnormal gait pattern, evidence for imbalance, deviates 10-15 in outside 12 in walkway width. Requires more than 9 sec to ambulate 20 ft.    Ambulating Backwards Walks 20 ft, uses assistive device, slower speed, mild gait deviations, deviates 6-10 in outside 12 in walkway width.    Steps Alternating feet, no rail.    Total Score 25    FGA comment: 25/30               Vestibular Assessment - 07/05/19 1046      Positional Sensitivities   Nose to Right Knee No dizziness    Right Knee to Sitting No dizziness    Nose to Left Knee No dizziness    Left Knee to Sitting No dizziness    Head Turning x 5 No dizziness    Head Nodding x 5 No dizziness    Pivot Right in Standing No dizziness    Pivot Left in Standing No dizziness                             PT Education - 07/05/19 1239    Education Details progress made, all goals met, D/C today  Person(s) Educated Patient    Methods Explanation    Comprehension Verbalized understanding            PT Short Term Goals - 05/07/19 1414      PT SHORT TERM GOAL #1   Title = LTG (cert written for 8 weeks but anticipate D/C in 4 weeks)             PT Long Term Goals - 07/05/19 1035      PT LONG TERM GOAL #1   Title Pt will demonstrate independence with final HEP and will begin walking around neighborhood    Baseline independent with HEP, walking .5 miles each day with friend    Time 4    Period Weeks    Status Achieved      PT LONG TERM GOAL #2   Title Pt will demonstrate no motion sensitivity to vertical head movements and bending down to the ground    Baseline --    Time 4    Period Weeks    Status Achieved      PT LONG TERM GOAL #3   Title Pt will improve FGA to >/= 24/30 to indicate decreased falls risk in community    Baseline 22/30 > 25/30    Time 4    Period Weeks    Status Achieved      PT LONG TERM GOAL #4   Title Pt gait velocity will increase to >/= 3.0 ft/sec    Baseline 3.2 ft/sec    Time 4    Period Weeks    Status Achieved      PT LONG TERM GOAL #5   Title Pt will perform 6 minute walk test outside and ambulate >1300 feet over uneven pavement MOD I with mild SOB, no LOB with L/R turns    Baseline 1354 inside due to the heat MOD I, mild SOB    Time 4    Period Weeks    Status Achieved      PT LONG TERM GOAL #6   Title Pt will improve overall function on FOTO to >= 74%; DHI goal met    Baseline 79%, and DHI 4    Time 4    Period Weeks    Status Achieved                 Plan - 07/05/19 1241    Clinical Impression Statement Pt has made excellent progress and was able to maintain gains and make progress with walking program over the past 3 week break from therapy.  Pt has met all LTG and demonstrates  improvements in balance, gait velocity, resolution of dizziness and motion sensitivity, improved endurance and decreased falls risk.  Pt to continue with HEP and with walking program.  Pt is ready for D/C today.    Personal Factors and Comorbidities Age;Comorbidity 3+;Finances;Past/Current Experience;Transportation    Comorbidities DVT, TIA, anemia, arthritis, osteopenia, macular degeneration, dizziness and falls    Examination-Activity Limitations Bend;Locomotion Level;Reach Overhead;Stand    Examination-Participation Restrictions Cleaning;Community Activity;Driving;Laundry;Meal Prep    Stability/Clinical Decision Making Evolving/Moderate complexity    Rehab Potential Good    PT Frequency 2x / week    PT Duration 8 weeks   Cert written for 8, anticipate D/C In 4   PT Treatment/Interventions ADLs/Self Care Home Management;DME Instruction;Gait training;Stair training;Functional mobility training;Therapeutic activities;Therapeutic exercise;Balance training;Neuromuscular re-education;Patient/family education;Vestibular;Cryotherapy;Moist Heat;Orthotic Fit/Training;Manual techniques;Passive range of motion    PT Next Visit Plan D/C    Consulted and Agree  with Plan of Care Patient           Patient will benefit from skilled therapeutic intervention in order to improve the following deficits and impairments:  Decreased balance, Decreased endurance, Decreased range of motion, Decreased strength, Difficulty walking, Dizziness, Pain  Visit Diagnosis: Dizziness and giddiness  Unsteadiness on feet  Difficulty in walking, not elsewhere classified  Muscle weakness (generalized)  Repeated falls     Problem List Patient Active Problem List   Diagnosis Date Noted  . Dizziness 02/14/2019  . Chest pain 02/14/2019  . DVT, lower extremity, distal, chronic ([place]) 12/24/2018  . Palpitations 05/30/2018  . Irregular heartbeat 03/02/2018  . TIA (transient ischemic attack) 02/15/2018  . Urinary,  incontinence, stress female 06/16/2016  . Right carotid bruit 12/23/2014  . Exertional dyspnea 12/24/2013  . Bilateral arm numbness and tingling while sleeping - and shortly after waking 12/24/2013  . Poor balance 12/24/2013  . Edema of left lower extremity 12/21/2012  . Essential hypertension; borderline   . H/O hypercholesterolemia   . Osteoporosis - of the spine 09/22/2011    PHYSICAL THERAPY DISCHARGE SUMMARY  Visits from Start of Care: 17  Current functional level related to goals / functional outcomes: Pt has met all LTG; see impression statement above   Remaining deficits: Mild imbalance and decreased endurance   Education / Equipment: HEP, walking program; educated pt on use of gardening/kneeling bench when working in the garden or inside to avoid prolonged periods of time bending down and improve safety and decrease risk for falling when bending down to the ground.    Plan: Patient agrees to discharge.  Patient goals were met. Patient is being discharged due to meeting the stated rehab goals.  ?????     Rico Junker, PT, DPT 07/05/19    12:43 PM    [facility] [address] [place], [place], [address] Phone: [phone]   Fax:  [phone]  Name: CLAIRA JETER MRN: [id number] Date of Birth: 1927-11-28

## 2019-07-09 DIAGNOSIS — M25712 Osteophyte, left shoulder: Secondary | ICD-10-CM | POA: Diagnosis not present

## 2019-07-09 DIAGNOSIS — M19012 Primary osteoarthritis, left shoulder: Secondary | ICD-10-CM | POA: Diagnosis not present

## 2019-07-12 ENCOUNTER — Other Ambulatory Visit: Payer: Self-pay | Admitting: Orthopedic Surgery

## 2019-07-12 DIAGNOSIS — M25512 Pain in left shoulder: Secondary | ICD-10-CM

## 2019-07-12 DIAGNOSIS — G8929 Other chronic pain: Secondary | ICD-10-CM

## 2019-07-15 ENCOUNTER — Other Ambulatory Visit: Payer: Self-pay

## 2019-07-15 ENCOUNTER — Encounter (INDEPENDENT_AMBULATORY_CARE_PROVIDER_SITE_OTHER): Payer: Medicare PPO | Admitting: Ophthalmology

## 2019-07-15 DIAGNOSIS — H353211 Exudative age-related macular degeneration, right eye, with active choroidal neovascularization: Secondary | ICD-10-CM | POA: Diagnosis not present

## 2019-07-15 DIAGNOSIS — H43813 Vitreous degeneration, bilateral: Secondary | ICD-10-CM

## 2019-07-15 DIAGNOSIS — D3132 Benign neoplasm of left choroid: Secondary | ICD-10-CM

## 2019-07-15 DIAGNOSIS — H353122 Nonexudative age-related macular degeneration, left eye, intermediate dry stage: Secondary | ICD-10-CM

## 2019-08-16 ENCOUNTER — Other Ambulatory Visit: Payer: Self-pay

## 2019-08-16 ENCOUNTER — Ambulatory Visit
Admission: RE | Admit: 2019-08-16 | Discharge: 2019-08-16 | Disposition: A | Discharge status: Home / Self Care | Payer: Medicare PPO | Source: Ambulatory Visit | Attending: Orthopedic Surgery | Admitting: Orthopedic Surgery

## 2019-08-16 DIAGNOSIS — M25512 Pain in left shoulder: Secondary | ICD-10-CM

## 2019-08-16 DIAGNOSIS — M75112 Incomplete rotator cuff tear or rupture of left shoulder, not specified as traumatic: Secondary | ICD-10-CM | POA: Diagnosis not present

## 2019-08-16 DIAGNOSIS — M7552 Bursitis of left shoulder: Secondary | ICD-10-CM | POA: Diagnosis not present

## 2019-08-16 DIAGNOSIS — G8929 Other chronic pain: Secondary | ICD-10-CM

## 2019-08-22 DIAGNOSIS — M7582 Other shoulder lesions, left shoulder: Secondary | ICD-10-CM | POA: Diagnosis not present

## 2019-08-22 DIAGNOSIS — M7542 Impingement syndrome of left shoulder: Secondary | ICD-10-CM | POA: Diagnosis not present

## 2019-08-26 ENCOUNTER — Other Ambulatory Visit: Payer: Self-pay

## 2019-08-26 ENCOUNTER — Ambulatory Visit: Payer: Medicare PPO | Admitting: Cardiology

## 2019-08-26 ENCOUNTER — Encounter: Payer: Self-pay | Admitting: Cardiology

## 2019-08-26 VITALS — BP 128/60 | HR 57 | Ht 63.0 in | Wt 129.0 lb

## 2019-08-26 DIAGNOSIS — G459 Transient cerebral ischemic attack, unspecified: Secondary | ICD-10-CM

## 2019-08-26 DIAGNOSIS — R600 Localized edema: Secondary | ICD-10-CM

## 2019-08-26 DIAGNOSIS — I825Z2 Chronic embolism and thrombosis of unspecified deep veins of left distal lower extremity: Secondary | ICD-10-CM | POA: Diagnosis not present

## 2019-08-26 DIAGNOSIS — R002 Palpitations: Secondary | ICD-10-CM | POA: Diagnosis not present

## 2019-08-26 DIAGNOSIS — I10 Essential (primary) hypertension: Secondary | ICD-10-CM | POA: Diagnosis not present

## 2019-08-26 NOTE — Patient Instructions (Signed)
Medication Instructions:  No changes *If you need a refill on your cardiac medications before your next appointment, please call your pharmacy*   Lab Work: Not needed    Testing/Procedures: Not needed   Follow-Up: At [facility], you and your health needs are our priority.  As part of our continuing mission to provide you with exceptional heart care, we have created designated Provider Care Teams.  These Care Teams include your primary Cardiologist (physician) and Advanced Practice Providers (APPs -  Physician Assistants and Nurse Practitioners) who all work together to provide you with the care you need, when you need it.  We recommend signing up for the patient portal called "MyChart".  Sign up information is provided on this After Visit Summary.  MyChart is used to connect with patients for Virtual Visits (Telemedicine).  Patients are able to view lab/test results, encounter notes, upcoming appointments, etc.  Non-urgent messages can be sent to your provider as well.   To learn more about what you can do with MyChart, go to [email or web address].    Your next appointment:   6 month(s)  The format for your next appointment:   In Person  Provider:   Glenetta Hew, MD   Other Instructions Keep walking

## 2019-08-26 NOTE — Progress Notes (Signed)
Primary Care Provider: Lavone Orn, MD Cardiologist: Glenetta Hew, MD Electrophysiologist: None  Clinic Note: Chief Complaint  Patient presents with  . Follow-up    Doing well, slowly feeling better     HPI:    [name] is a 86 y.o. female with a PMH notable for hypertension and mild venous stasis edema, DVT and palpitations who presents today for 26-month follow-up.  Carliyah P Ruz was last seen on March 24, 2019 via telemedicine--she noted exertional dyspnea, had been diagnosed and treated for COVID-19 infection in January 2021 (along with her husband); --> she was on Eliquis for DVT and PE.  Swelling improved.  Support stockings.  Follow-up venous Dopplers showed no evidence of left common femoral vein obstruction.  Resolution of the mid peroneal vein DVT with ankle edema.  Plan was to complete current bottle of Eliquis and then stop, return day 1 mg aspirin  Recent Hospitalizations: None  Reviewed  CV studies:    The following studies were reviewed today: (if available, images/films reviewed: From Epic Chart or Care Everywhere) . None:  Interval History:   Aurorah Schlachter Bogacki returns for follow-up today stating that she is doing fairly well.  She says that she still has these little stinging sensations in her chest off and on maybe once or twice a week.  They do not last very long or not associate with any activity.  She says that she gets some shortness of breath every now and then made to stop to catch her breath but this is happening less often.  She uses her albuterol and it helps.  She is not having any exertional chest pain.  No PND orthopnea.  Swelling is improved and no further signs of DVT.  She said that she really has not gotten back into walking that much.  It seems like she is taking a long time to recover from her bout of Covid last year.  Everything seems to slow down since then.  She has been try to work with PT and OT recently for her left rotator cuff and has  been having a lot of pain related to that.  The lack of exercise and activity seems to be slowing her down some.  CV Review of Symptoms (Summary) Cardiovascular ROS: positive for - dyspnea on exertion, edema and This is really minimal.  She gets short of breath mostly now because of deconditioning.  Edema seems to be dependent edema that goes down at night.  She is not always cognizant of putting her feet up today.  When she does it improves. negative for - chest pain, irregular heartbeat, orthopnea, palpitations, paroxysmal nocturnal dyspnea, rapid heart rate, shortness of breath or Lightheadedness or dizziness, syncope/near syncope or TIA/amaurosis fugax.  Claudication.  The patient does not have symptoms concerning for COVID-19 infection (fever, chills, cough, or new shortness of breath).  The patient is practicing social distancing & Masking.  Immunization History  Administered Date(s) Administered  . Influenza, High Dose Seasonal PF 10/30/2013  . PFIZER SARS-COV-2 Vaccination 04/26/2019, 05/20/2019  . Pneumococcal Conjugate-13 10/30/2013  . Tdap 09/25/2011   REVIEWED OF SYSTEMS   Review of Systems  Constitutional: Negative for weight loss. Malaise/fatigue: Still slow recovery.  Getting better every week, but has on and off days.  More days on then off now.  HENT: Negative for nosebleeds.   Respiratory: Negative for cough.   Cardiovascular: Positive for leg swelling (Really minimal).  Gastrointestinal: Negative for blood in stool and melena.  Genitourinary: Negative for hematuria.  Musculoskeletal: Positive for joint pain (Currently given the left rotator cuff injury.  PT OT).  Neurological: Negative for dizziness, focal weakness, loss of consciousness (No more issues), weakness and headaches.  Psychiatric/Behavioral: Negative for depression and memory loss. The patient has insomnia (She wakes up several times a night and has a hard time going back to sleep). The patient is not  nervous/anxious.    I have reviewed and (if needed) personally updated the patient's problem list, medications, allergies, past medical and surgical history, social and family history.   PAST MEDICAL HISTORY   Past Medical History:  Diagnosis Date  . Abnormal Pap smear 1975-76  . Anemia    history of  . Arthritis    spine  . Breast cyst, left 1980  . Cystocele 2012  . Diverticulosis    with h/o Diverticulitis  . GERD (gastroesophageal reflux disease)   . H/O hypercholesterolemia   . H/O osteopenia   . H/O varicella   . H/O varicose veins   . Hx of Mumps   . Macular degeneration   . Seasonal allergies   . Urge incontinence 2012  . Urinary frequency 2010    PAST SURGICAL HISTORY   Past Surgical History:  Procedure Laterality Date  . ABDOMINAL HYSTERECTOMY    . BLADDER SUSPENSION N/A 06/16/2016   Procedure: TRANSVAGINAL TAPE (TVT) PROCEDURE;  Surgeon: Everett Graff, MD;  Location: [facility];  Service: Gynecology;  Laterality: N/A;  . BREAST CYST ASPIRATION  1964  . COLONOSCOPY     Dr. Earlean Shawl  . CYSTOCELE REPAIR N/A 06/16/2016   Procedure: ANTERIOR REPAIR (CYSTOCELE);  Surgeon: Everett Graff, MD;  Location: [facility];  Service: Gynecology;  Laterality: N/A;  . CYSTOSCOPY N/A 06/16/2016   Procedure: CYSTOSCOPY;  Surgeon: Everett Graff, MD;  Location: [facility];  Service: Gynecology;  Laterality: N/A;  see anterior repair  . Lower Extremity Venous Dopplers  01/09/2012   Right and left lower steroids: No evidence of thrombus or, thrombophlebitis; right and left GSV and SSV: No venous insufficiency. Normal exam.  . NM MYOVIEW LTD  11/2016   6.6 METS.  Reached 106% of max.  Heart rate.  Walk for 4:40 min.  EF 72%.  Normal blood pressure response.  upsloping ST segment depression, nonspecific.  Otherwise normal study.  LOW RISK.  No evidence of ischemia or infarction.  . TRANSTHORACIC ECHOCARDIOGRAM  01/2018   EF > 65%. LV size small. Gr 1 DD (normal for age).   Normal atrial size.  Moderate Aortic Sclerosis without Stenosis. Moderate Mitral Annular Calcification with mild MR & no MS    MEDICATIONS/ALLERGIES   Current Meds  Medication Sig  . acetaminophen (TYLENOL) 500 MG tablet Take 500-1,000 mg by mouth every 8 (eight) hours as needed for mild pain.   Marland Kitchen albuterol (VENTOLIN HFA) 108 (90 Base) MCG/ACT inhaler Inhale 2 puffs into the lungs every 4 (four) hours as needed for wheezing or shortness of breath.   Marland Kitchen ascorbic acid (VITAMIN C) 500 MG tablet Take 500-1,000 mg by mouth daily with breakfast.  . aspirin EC 81 MG tablet Take 1 tablet (81 mg total) by mouth daily.  . Cholecalciferol (VITAMIN D-3) 25 MCG (1000 UT) CAPS Take 1,000 Units by mouth daily with breakfast.   . Lactobacillus Rhamnosus, GG, (CULTURELLE) CAPS Take 1 capsule by mouth every morning.   Marland Kitchen lisinopril (ZESTRIL) 10 MG tablet Take 10 mg by mouth daily.  . Magnesium 250 MG TABS Take 250 mg by  mouth daily as needed (leg cramps).  . metoprolol tartrate (LOPRESSOR) 25 MG tablet Take 0.5 tablets by mouth 2 (two) times daily.  . Multiple Vitamins-Minerals (AIRBORNE PO) Take 1 tablet by mouth daily with breakfast.  . Multiple Vitamins-Minerals (CENTRUM SILVER ULTRA WOMENS) TABS Take 1 tablet by mouth daily with breakfast.   . pravastatin (PRAVACHOL) 20 MG tablet Take 20 mg by mouth daily.   . Trospium Chloride 60 MG CP24 Take 60 mg by mouth daily.   Marland Kitchen zinc gluconate 50 MG tablet Take 50-100 mg by mouth daily with breakfast.  . [DISCONTINUED] metoprolol succinate (TOPROL-XL) 50 MG 24 hr tablet Take 50 mg by mouth daily.    Allergies  Allergen Reactions  . Ivp Dye [Iodinated Diagnostic Agents] Hives  . Myrbetriq [Mirabegron] Other (See Comments)    "Makes me urinate more frequently"    SOCIAL HISTORY/FAMILY HISTORY   Reviewed in Epic:  Pertinent findings: n/a  OBJCTIVE -PE, EKG, labs   Wt Readings from Last 3 Encounters:  08/26/19 129 lb (58.5 kg)  02/14/19 124 lb 9 oz (56.5 kg)  12/24/18 132 lb  1.6 oz (59.9 kg)    Physical Exam: BP 128/60   Pulse (!) 57   Ht 5\' 3"  (1.6 m)   Wt 129 lb (58.5 kg)   BMI 22.85 kg/m  Physical Exam Neck:     Vascular: No carotid bruit or JVD.  Cardiovascular:     Rate and Rhythm: Normal rate and regular rhythm. Occasional extrasystoles are present.    Chest Wall: PMI is not displaced.     Pulses: Normal pulses.     Heart sounds: Normal heart sounds and S1 normal. Heart sounds not distant (Physiologically split S2). No murmur heard.  No gallop.   Pulmonary:     Effort: Pulmonary effort is normal. No respiratory distress.  Abdominal:     General: Abdomen is flat. Bowel sounds are normal. There is no distension.     Palpations: Abdomen is soft. There is no mass (No HSM or bruit.).     Tenderness: There is no abdominal tenderness.  Musculoskeletal:        General: No swelling (Very minimal bilateral ankles 1+ at most).     Cervical back: Normal range of motion.  Neurological:     General: No focal deficit present.     Mental Status: She is oriented to person, place, and time.  Psychiatric:        Mood and Affect: Mood normal.        Behavior: Behavior normal.        Thought Content: Thought content normal.        Judgment: Judgment normal.     Comments: Remarkably sharp for her age     Adult ECG Report  Rate: 57;  Rhythm: sinus bradycardia and RBBB with repolarization changes, LAA.  Otherwise normal axis, intervals durations.;   Narrative Interpretation: Stable  Recent Labs: None since October 2020: TC 179, TG 135, HDL 60, LDL 92.  --> 02/18/2018:: Hgb 11.1, Cr 1.03, K+ 4.0.  TSH 0.88. --> 07/03/2018-A1c 5.8 Lab Results  Component Value Date   CHOL 185 02/16/2018   HDL 62 02/16/2018   LDLCALC 113 (H) 02/16/2018   TRIG 48 02/16/2018   CHOLHDL 3.0 02/16/2018   Lab Results  Component Value Date   CREATININE 1.14 (H) 02/13/2019   BUN 17 02/13/2019   NA 138 02/14/2019   K 4.3 02/14/2019   CL 103 02/13/2019   CO2 24  02/13/2019    Lab Results  Component Value Date   TSH 2.427 02/16/2018    ASSESSMENT/PLAN    Problem List Items Addressed This Visit    Essential hypertension; borderline - Primary (Chronic)    Blood pressure looks great.  No change to current meds.  She is on low-dose Lopressor and monitor assessment of her.      Relevant Medications   metoprolol tartrate (LOPRESSOR) 25 MG tablet   Other Relevant Orders   EKG 12-Lead (Completed)   Palpitations (Chronic)    The symptoms seem to be completely resolved. Continue low-dose beta-blocker      Relevant Orders   EKG 12-Lead (Completed)   DVT, lower extremity, distal, chronic (HCC) (Chronic)    DVT seems to have resolved on follow-up Dopplers.  She has now no longer on Eliquis.  Back on aspirin. Recommend stay active with walking.  That she continues If she starts having swelling, need to reassess because of concern of possible thrombophlebitis.      Relevant Medications   metoprolol tartrate (LOPRESSOR) 25 MG tablet   Edema of left lower extremity (Chronic)    She has had intermittent swelling for quite some time but seems to be relatively stable.  Try to avoid diuretic.  Elevating her feet using support stockings if swelling increases      Relevant Orders   EKG 12-Lead (Completed)   TIA (transient ischemic attack) (Chronic)    Nothing to suggest any A. fib.  She is been on Eliquis for DVT, will switch to back to aspirin medically we resolved.  We should be fine continue to monitor.        Relevant Medications   metoprolol tartrate (LOPRESSOR) 25 MG tablet       COVID-19 Education: The signs and symptoms of COVID-19 were discussed with the patient and how to seek care for testing (follow up with PCP or arrange E-visit).   The importance of social distancing and COVID-19 vaccination was discussed today.  I spent a total of 18 minutes with the patient. >  50% of the time was spent in direct patient consultation.  Additional time  spent with chart review  / charting (studies, outside notes, etc): 4 Total Time: 22 min   Current medicines are reviewed at length with the patient today.  (+/- concerns) 26  Notice: This dictation was prepared with Dragon dictation along with smaller phrase technology. Any transcriptional errors that result from this process are unintentional and may not be corrected upon review.  Patient Instructions / Medication Changes & Studies & Tests Ordered   Patient Instructions  Medication Instructions:  No changes *If you need a refill on your cardiac medications before your next appointment, please call your pharmacy*   Lab Work: Not needed    Testing/Procedures: Not needed   Follow-Up: At [facility], you and your health needs are our priority.  As part of our continuing mission to provide you with exceptional heart care, we have created designated Provider Care Teams.  These Care Teams include your primary Cardiologist (physician) and Advanced Practice Providers (APPs -  Physician Assistants and Nurse Practitioners) who all work together to provide you with the care you need, when you need it.  We recommend signing up for the patient portal called "MyChart".  Sign up information is provided on this After Visit Summary.  MyChart is used to connect with patients for Virtual Visits (Telemedicine).  Patients are able to view lab/test results, encounter notes, upcoming appointments,  etc.  Non-urgent messages can be sent to your provider as well.   To learn more about what you can do with MyChart, go to [email or web address].    Your next appointment:   6 month(s)  The format for your next appointment:   In Person  Provider:   Glenetta Hew, MD   Other Instructions Keep walking     Studies Ordered:   Orders Placed This Encounter  Procedures  . EKG 12-Lead     Glenetta Hew, M.D., M.S. Interventional Cardiologist   Pager # [phone] Phone # [phone] [address]. [place], [place] [address]   Thank you for choosing [facility] at [facility]!!

## 2019-08-30 ENCOUNTER — Encounter: Payer: Self-pay | Admitting: Cardiology

## 2019-08-30 NOTE — Assessment & Plan Note (Signed)
Blood pressure looks great.  No change to current meds.  She is on low-dose Lopressor and monitor assessment of her.

## 2019-08-30 NOTE — Assessment & Plan Note (Signed)
She has had intermittent swelling for quite some time but seems to be relatively stable.  Try to avoid diuretic.  Elevating her feet using support stockings if swelling increases

## 2019-08-30 NOTE — Assessment & Plan Note (Signed)
Nothing to suggest any A. fib.  She is been on Eliquis for DVT, will switch to back to aspirin medically we resolved.  We should be fine continue to monitor.

## 2019-08-30 NOTE — Assessment & Plan Note (Signed)
The symptoms seem to be completely resolved. Continue low-dose beta-blocker

## 2019-08-30 NOTE — Assessment & Plan Note (Signed)
DVT seems to have resolved on follow-up Dopplers.  She has now no longer on Eliquis.  Back on aspirin. Recommend stay active with walking.  That she continues If she starts having swelling, need to reassess because of concern of possible thrombophlebitis.

## 2019-09-17 DIAGNOSIS — M7542 Impingement syndrome of left shoulder: Secondary | ICD-10-CM | POA: Diagnosis not present

## 2019-09-17 DIAGNOSIS — M7582 Other shoulder lesions, left shoulder: Secondary | ICD-10-CM | POA: Diagnosis not present

## 2019-10-01 DIAGNOSIS — K219 Gastro-esophageal reflux disease without esophagitis: Secondary | ICD-10-CM | POA: Diagnosis not present

## 2019-10-01 DIAGNOSIS — R1030 Lower abdominal pain, unspecified: Secondary | ICD-10-CM | POA: Diagnosis not present

## 2019-10-01 DIAGNOSIS — R32 Unspecified urinary incontinence: Secondary | ICD-10-CM | POA: Diagnosis not present

## 2019-10-01 DIAGNOSIS — R1084 Generalized abdominal pain: Secondary | ICD-10-CM | POA: Diagnosis not present

## 2019-10-01 DIAGNOSIS — Z8616 Personal history of COVID-19: Secondary | ICD-10-CM | POA: Diagnosis not present

## 2019-10-01 DIAGNOSIS — R159 Full incontinence of feces: Secondary | ICD-10-CM | POA: Diagnosis not present

## 2019-10-01 DIAGNOSIS — R634 Abnormal weight loss: Secondary | ICD-10-CM | POA: Diagnosis not present

## 2019-10-16 DIAGNOSIS — Z809 Family history of malignant neoplasm, unspecified: Secondary | ICD-10-CM | POA: Diagnosis not present

## 2019-10-16 DIAGNOSIS — Z91041 Radiographic dye allergy status: Secondary | ICD-10-CM | POA: Diagnosis not present

## 2019-10-16 DIAGNOSIS — N3941 Urge incontinence: Secondary | ICD-10-CM | POA: Diagnosis not present

## 2019-10-16 DIAGNOSIS — Z8673 Personal history of transient ischemic attack (TIA), and cerebral infarction without residual deficits: Secondary | ICD-10-CM | POA: Diagnosis not present

## 2019-10-16 DIAGNOSIS — Z9181 History of falling: Secondary | ICD-10-CM | POA: Diagnosis not present

## 2019-10-16 DIAGNOSIS — R030 Elevated blood-pressure reading, without diagnosis of hypertension: Secondary | ICD-10-CM | POA: Diagnosis not present

## 2019-10-16 DIAGNOSIS — Z8249 Family history of ischemic heart disease and other diseases of the circulatory system: Secondary | ICD-10-CM | POA: Diagnosis not present

## 2019-10-16 DIAGNOSIS — K5900 Constipation, unspecified: Secondary | ICD-10-CM | POA: Diagnosis not present

## 2019-10-17 DIAGNOSIS — K5641 Fecal impaction: Secondary | ICD-10-CM | POA: Diagnosis not present

## 2019-10-17 DIAGNOSIS — Z90710 Acquired absence of both cervix and uterus: Secondary | ICD-10-CM | POA: Diagnosis not present

## 2019-10-17 DIAGNOSIS — R634 Abnormal weight loss: Secondary | ICD-10-CM | POA: Diagnosis not present

## 2019-10-17 DIAGNOSIS — K5730 Diverticulosis of large intestine without perforation or abscess without bleeding: Secondary | ICD-10-CM | POA: Diagnosis not present

## 2019-10-17 DIAGNOSIS — R1111 Vomiting without nausea: Secondary | ICD-10-CM | POA: Diagnosis not present

## 2019-10-29 DIAGNOSIS — R1084 Generalized abdominal pain: Secondary | ICD-10-CM | POA: Diagnosis not present

## 2019-11-05 DIAGNOSIS — R634 Abnormal weight loss: Secondary | ICD-10-CM | POA: Diagnosis not present

## 2019-11-05 DIAGNOSIS — Z8719 Personal history of other diseases of the digestive system: Secondary | ICD-10-CM | POA: Diagnosis not present

## 2019-11-05 DIAGNOSIS — R159 Full incontinence of feces: Secondary | ICD-10-CM | POA: Diagnosis not present

## 2019-11-07 DIAGNOSIS — N1831 Chronic kidney disease, stage 3a: Secondary | ICD-10-CM | POA: Diagnosis not present

## 2019-11-07 DIAGNOSIS — Z1389 Encounter for screening for other disorder: Secondary | ICD-10-CM | POA: Diagnosis not present

## 2019-11-07 DIAGNOSIS — K5900 Constipation, unspecified: Secondary | ICD-10-CM | POA: Diagnosis not present

## 2019-11-07 DIAGNOSIS — N3281 Overactive bladder: Secondary | ICD-10-CM | POA: Diagnosis not present

## 2019-11-07 DIAGNOSIS — K580 Irritable bowel syndrome with diarrhea: Secondary | ICD-10-CM | POA: Diagnosis not present

## 2019-11-07 DIAGNOSIS — Z0000 Encounter for general adult medical examination without abnormal findings: Secondary | ICD-10-CM | POA: Diagnosis not present

## 2019-11-07 DIAGNOSIS — Z87898 Personal history of other specified conditions: Secondary | ICD-10-CM | POA: Diagnosis not present

## 2019-11-07 DIAGNOSIS — Z8673 Personal history of transient ischemic attack (TIA), and cerebral infarction without residual deficits: Secondary | ICD-10-CM | POA: Diagnosis not present

## 2019-11-22 DIAGNOSIS — R519 Headache, unspecified: Secondary | ICD-10-CM | POA: Diagnosis not present

## 2019-11-22 DIAGNOSIS — R6883 Chills (without fever): Secondary | ICD-10-CM | POA: Diagnosis not present

## 2019-11-22 DIAGNOSIS — R059 Cough, unspecified: Secondary | ICD-10-CM | POA: Diagnosis not present

## 2019-11-22 DIAGNOSIS — R0602 Shortness of breath: Secondary | ICD-10-CM | POA: Diagnosis not present

## 2019-11-22 DIAGNOSIS — J029 Acute pharyngitis, unspecified: Secondary | ICD-10-CM | POA: Diagnosis not present

## 2019-11-22 DIAGNOSIS — R42 Dizziness and giddiness: Secondary | ICD-10-CM | POA: Diagnosis not present

## 2019-11-22 DIAGNOSIS — I10 Essential (primary) hypertension: Secondary | ICD-10-CM | POA: Diagnosis not present

## 2019-11-22 DIAGNOSIS — R918 Other nonspecific abnormal finding of lung field: Secondary | ICD-10-CM | POA: Diagnosis not present

## 2019-11-25 DIAGNOSIS — R600 Localized edema: Secondary | ICD-10-CM | POA: Diagnosis not present

## 2019-11-25 DIAGNOSIS — R079 Chest pain, unspecified: Secondary | ICD-10-CM | POA: Diagnosis not present

## 2019-11-25 DIAGNOSIS — I10 Essential (primary) hypertension: Secondary | ICD-10-CM | POA: Diagnosis not present

## 2019-12-02 ENCOUNTER — Other Ambulatory Visit: Payer: Self-pay | Admitting: Obstetrics and Gynecology

## 2019-12-02 DIAGNOSIS — Z1231 Encounter for screening mammogram for malignant neoplasm of breast: Secondary | ICD-10-CM

## 2019-12-03 DIAGNOSIS — R0600 Dyspnea, unspecified: Secondary | ICD-10-CM | POA: Diagnosis not present

## 2019-12-03 DIAGNOSIS — R079 Chest pain, unspecified: Secondary | ICD-10-CM | POA: Diagnosis not present

## 2019-12-03 DIAGNOSIS — I10 Essential (primary) hypertension: Secondary | ICD-10-CM | POA: Diagnosis not present

## 2019-12-03 DIAGNOSIS — Z1152 Encounter for screening for COVID-19: Secondary | ICD-10-CM | POA: Diagnosis not present

## 2019-12-05 DIAGNOSIS — R350 Frequency of micturition: Secondary | ICD-10-CM | POA: Diagnosis not present

## 2019-12-05 DIAGNOSIS — N390 Urinary tract infection, site not specified: Secondary | ICD-10-CM | POA: Diagnosis not present

## 2019-12-05 DIAGNOSIS — N3941 Urge incontinence: Secondary | ICD-10-CM | POA: Diagnosis not present

## 2020-01-08 ENCOUNTER — Other Ambulatory Visit: Payer: Self-pay | Admitting: Internal Medicine

## 2020-01-08 ENCOUNTER — Ambulatory Visit
Admission: RE | Admit: 2020-01-08 | Discharge: 2020-01-08 | Disposition: A | Discharge status: Home / Self Care | Payer: Medicare PPO | Source: Ambulatory Visit | Attending: Internal Medicine | Admitting: Internal Medicine

## 2020-01-08 DIAGNOSIS — Z981 Arthrodesis status: Secondary | ICD-10-CM | POA: Diagnosis not present

## 2020-01-08 DIAGNOSIS — R400 Somnolence: Secondary | ICD-10-CM | POA: Diagnosis not present

## 2020-01-08 DIAGNOSIS — R519 Headache, unspecified: Secondary | ICD-10-CM

## 2020-01-08 DIAGNOSIS — M47812 Spondylosis without myelopathy or radiculopathy, cervical region: Secondary | ICD-10-CM | POA: Diagnosis not present

## 2020-01-08 DIAGNOSIS — M2578 Osteophyte, vertebrae: Secondary | ICD-10-CM | POA: Diagnosis not present

## 2020-01-08 DIAGNOSIS — M542 Cervicalgia: Secondary | ICD-10-CM | POA: Diagnosis not present

## 2020-01-13 ENCOUNTER — Ambulatory Visit
Admission: RE | Admit: 2020-01-13 | Discharge: 2020-01-13 | Disposition: A | Discharge status: Home / Self Care | Payer: Medicare PPO | Source: Ambulatory Visit | Attending: Obstetrics and Gynecology | Admitting: Obstetrics and Gynecology

## 2020-01-13 ENCOUNTER — Other Ambulatory Visit: Payer: Self-pay

## 2020-01-13 DIAGNOSIS — Z1231 Encounter for screening mammogram for malignant neoplasm of breast: Secondary | ICD-10-CM

## 2020-01-20 ENCOUNTER — Encounter (INDEPENDENT_AMBULATORY_CARE_PROVIDER_SITE_OTHER): Payer: Medicare PPO | Admitting: Ophthalmology

## 2020-01-20 ENCOUNTER — Other Ambulatory Visit: Payer: Self-pay

## 2020-01-20 DIAGNOSIS — H43813 Vitreous degeneration, bilateral: Secondary | ICD-10-CM

## 2020-01-20 DIAGNOSIS — D3132 Benign neoplasm of left choroid: Secondary | ICD-10-CM | POA: Diagnosis not present

## 2020-01-20 DIAGNOSIS — H353211 Exudative age-related macular degeneration, right eye, with active choroidal neovascularization: Secondary | ICD-10-CM | POA: Diagnosis not present

## 2020-01-20 DIAGNOSIS — H353122 Nonexudative age-related macular degeneration, left eye, intermediate dry stage: Secondary | ICD-10-CM

## 2020-01-22 DIAGNOSIS — R151 Fecal smearing: Secondary | ICD-10-CM | POA: Diagnosis not present

## 2020-01-22 DIAGNOSIS — R32 Unspecified urinary incontinence: Secondary | ICD-10-CM | POA: Diagnosis not present

## 2020-01-28 DIAGNOSIS — Z01419 Encounter for gynecological examination (general) (routine) without abnormal findings: Secondary | ICD-10-CM | POA: Diagnosis not present

## 2020-01-28 DIAGNOSIS — Z6824 Body mass index (BMI) 24.0-24.9, adult: Secondary | ICD-10-CM | POA: Diagnosis not present

## 2020-01-28 DIAGNOSIS — R300 Dysuria: Secondary | ICD-10-CM | POA: Diagnosis not present

## 2020-02-24 ENCOUNTER — Ambulatory Visit: Payer: Medicare PPO | Admitting: Cardiology

## 2020-02-25 DIAGNOSIS — R151 Fecal smearing: Secondary | ICD-10-CM | POA: Diagnosis not present

## 2020-02-25 DIAGNOSIS — R159 Full incontinence of feces: Secondary | ICD-10-CM | POA: Diagnosis not present

## 2020-03-12 ENCOUNTER — Ambulatory Visit: Payer: Medicare PPO | Admitting: Cardiology

## 2020-03-12 DIAGNOSIS — H40013 Open angle with borderline findings, low risk, bilateral: Secondary | ICD-10-CM | POA: Diagnosis not present

## 2020-03-12 DIAGNOSIS — H26492 Other secondary cataract, left eye: Secondary | ICD-10-CM | POA: Diagnosis not present

## 2020-03-12 DIAGNOSIS — H35033 Hypertensive retinopathy, bilateral: Secondary | ICD-10-CM | POA: Diagnosis not present

## 2020-03-12 DIAGNOSIS — H353212 Exudative age-related macular degeneration, right eye, with inactive choroidal neovascularization: Secondary | ICD-10-CM | POA: Diagnosis not present

## 2020-03-17 ENCOUNTER — Encounter: Payer: Self-pay | Admitting: Cardiology

## 2020-03-17 ENCOUNTER — Ambulatory Visit: Payer: Medicare PPO | Admitting: Cardiology

## 2020-03-17 ENCOUNTER — Other Ambulatory Visit: Payer: Self-pay

## 2020-03-17 VITALS — BP 121/71 | HR 69 | Ht 63.0 in | Wt 129.0 lb

## 2020-03-17 DIAGNOSIS — E785 Hyperlipidemia, unspecified: Secondary | ICD-10-CM | POA: Diagnosis not present

## 2020-03-17 DIAGNOSIS — R0600 Dyspnea, unspecified: Secondary | ICD-10-CM | POA: Diagnosis not present

## 2020-03-17 DIAGNOSIS — Z8639 Personal history of other endocrine, nutritional and metabolic disease: Secondary | ICD-10-CM

## 2020-03-17 DIAGNOSIS — I825Z2 Chronic embolism and thrombosis of unspecified deep veins of left distal lower extremity: Secondary | ICD-10-CM | POA: Diagnosis not present

## 2020-03-17 DIAGNOSIS — R002 Palpitations: Secondary | ICD-10-CM

## 2020-03-17 DIAGNOSIS — I10 Essential (primary) hypertension: Secondary | ICD-10-CM | POA: Diagnosis not present

## 2020-03-17 DIAGNOSIS — R0609 Other forms of dyspnea: Secondary | ICD-10-CM

## 2020-03-17 NOTE — Patient Instructions (Addendum)
Medication Instructions:   Try  Using your Albuterol  Before exercise   *If you need a refill on your cardiac medications before your next appointment, please call your pharmacy*   Lab Work:  Not needed   Testing/Procedures: Not needed   Follow-Up: At [facility], you and your health needs are our priority.  As part of our continuing mission to provide you with exceptional heart care, we have created designated Provider Care Teams.  These Care Teams include your primary Cardiologist (physician) and Advanced Practice Providers (APPs -  Physician Assistants and Nurse Practitioners) who all work together to provide you with the care you need, when you need it.  We recommend signing up for the patient portal called "MyChart".  Sign up information is provided on this After Visit Summary.  MyChart is used to connect with patients for Virtual Visits (Telemedicine).  Patients are able to view lab/test results, encounter notes, upcoming appointments, etc.  Non-urgent messages can be sent to your provider as well.   To learn more about what you can do with MyChart, go to [email or web address].    Your next appointment:   4 month(s) July 2022  The format for your next appointment:   In Person  Provider:   Glenetta Hew, MD   Other Instructions

## 2020-03-17 NOTE — Progress Notes (Signed)
Primary Care Provider: Lavone Orn, MD Cardiologist: Glenetta Hew, MD Electrophysiologist: None  Clinic Note: Chief Complaint  Patient presents with  . Follow-up    41-month, still has exertional dyspnea and fatigue    ===================================  ASSESSMENT/PLAN   Problem List Items Addressed This Visit    Exertional dyspnea    Probably still related to deconditioning and her recent Covid infection, however we talked about potentially considering a stress test if she were to get proceed progressively worse.  She indicated that she would like to hold off for now.  It sounds like she has some wheezing issues and this all began after her lung injury from [place].  My recommendation is that she uses her albuterol as needed but also prior to exercising.      Relevant Orders   EKG 12-Lead (Completed)   Dyslipidemia, goal LDL below 100    Last LDL was 113.  Would like to see it less than 100.  She is on pravastatin and Zetia. I am leery of switching her from pravastatin to a more potent statin, but if not able to improve lipids, may want to consider low-dose rosuvastatin in place of pravastatin.      Relevant Medications   ezetimibe (ZETIA) 10 MG tablet   Essential hypertension; borderline (Chronic)    Blood pressure looks well controlled.  She is only on low-dose metoprolol for palpitations.  No change.      Relevant Medications   ezetimibe (ZETIA) 10 MG tablet   Other Relevant Orders   EKG 12-Lead (Completed)   H/O hypercholesterolemia (Chronic)   Palpitations (Chronic)    Essentially resolved on low-dose beta-blocker.      Relevant Orders   EKG 12-Lead (Completed)   DVT, lower extremity, distal, chronic (HCC) - Primary (Chronic)    DVT is resolved.  She is back on aspirin no longer on Eliquis.  Continue to recommend staying active, foot elevation and support stockings.  So far no signs of thrombophlebitis.      Relevant Medications   ezetimibe (ZETIA)  10 MG tablet      ===================================  HPI:    [name] is a 87 y.o. female with a PMH notable for HTN, mild Venous StasisEdema-with History of DVT, and Intermittent Palpitations who presents today for 44-month follow-up per her request..  Echo Propp Chard was last seen on August 26, 2019 -> was doing well with no major issues.  Still has occasional stinging episodes in the chest.  Gets short of breath every now and then has to stop to catch her breath but not that frequently.  No further DVTs and swelling is improved.  Just not yet back to full walking.  Was having a long time recovering from her Covid last January.  She had a lot of long-term effects from the Covid as far as energy level and fatigue.  Relatively stable.  No changes made.  Recent Hospitalizations: None  Reviewed  CV studies:    The following studies were reviewed today: (if available, images/films reviewed: From Epic Chart or Care Everywhere) . None:   Interval History:   [name] returns here today for follow-up stating that she still has a fatigue issues.  She can still do her routine housework including cooking, cleaning and vacuuming, but does note that she is more fatigued than usual.  She has to stop and rest every so often.  No chest pain or pressure.  She also has much more on her  plate now because her husband (40 years old) is not really doing very well.  He is gotten worse health and progressive dementia failure to thrive.  She is both emotionally and physically spent.  She is actually lost weight down to 129 pounds from 140 pounds pre-Covid.  This is because she is just not eating as much and overall not doing as much.  She really has not gotten back to doing her routine level of work.  She gets short of breath more easily than she had been.  This all stemmed from when she had COVID.  She just gets more tired.  She does not really want to do any type of evaluation at this point time, but  is getting little bit concerned.  She has left greater than right swelling and venous stasis changes that seem to respond well to foot elevation.  Palpitations appear well controlled.  No syncope or near syncope, and no real CHF symptoms of PND, orthopnea or edema.  No resting exertional chest pain or pressure, just fatigue and dyspnea.  CV Review of Symptoms (Summary): positive for - dyspnea on exertion, edema and Exertional dyspnea related to deconditioning; dependent edema, but not at night.  Exercise intolerance/fatigue.  Deconditioning. negative for - chest pain, irregular heartbeat, orthopnea, palpitations, paroxysmal nocturnal dyspnea, rapid heart rate, shortness of breath or Syncope or near syncope, TIA/amaurosis fugax, claudication  The patient does not have symptoms concerning for COVID-19 infection (fever, chills, cough, or new shortness of breath).   REVIEWED OF SYSTEMS   Review of Systems  Constitutional: Positive for malaise/fatigue (Still has fatigue, albeit improving.  Exertional dyspnea and fatigue is still there.  Getting better, but still has bad days.) and weight loss (Since pre-Covid she has lost 11 pounds.).  HENT: Negative for congestion and nosebleeds.   Respiratory: Positive for shortness of breath (When she overdoes it).   Gastrointestinal: Negative for blood in stool and melena.  Genitourinary: Positive for frequency (Mostly nocturia). Negative for hematuria.  Musculoskeletal: Positive for joint pain. Negative for back pain and neck pain.  Neurological: Negative for dizziness and weakness.  Psychiatric/Behavioral: Positive for memory loss (A little bit forgetful.). The patient has insomnia (She wakes up for nocturia and has a hard time going back to sleep.). The patient is not nervous/anxious.    I have reviewed and (if needed) personally updated the patient's problem list, medications, allergies, past medical and surgical history, social and family history.   PAST  MEDICAL HISTORY   Past Medical History:  Diagnosis Date  . Abnormal Pap smear 1975-76  . Anemia    history of  . Arthritis    spine  . Breast cyst, left 1980  . Cystocele 2012  . Diverticulosis    with h/o Diverticulitis  . GERD (gastroesophageal reflux disease)   . H/O hypercholesterolemia   . H/O osteopenia   . H/O varicella   . H/O varicose veins   . Hx of Mumps   . Macular degeneration   . Seasonal allergies   . Urge incontinence 2012  . Urinary frequency 2010    PAST SURGICAL HISTORY   Past Surgical History:  Procedure Laterality Date  . ABDOMINAL HYSTERECTOMY    . BLADDER SUSPENSION N/A 06/16/2016   Procedure: TRANSVAGINAL TAPE (TVT) PROCEDURE;  Surgeon: Everett Graff, MD;  Location: [facility];  Service: Gynecology;  Laterality: N/A;  . BREAST CYST ASPIRATION  1964  . COLONOSCOPY     Dr. Earlean Shawl  . CYSTOCELE REPAIR N/A 06/16/2016  Procedure: ANTERIOR REPAIR (CYSTOCELE);  Surgeon: Everett Graff, MD;  Location: [facility];  Service: Gynecology;  Laterality: N/A;  . CYSTOSCOPY N/A 06/16/2016   Procedure: CYSTOSCOPY;  Surgeon: Everett Graff, MD;  Location: [facility];  Service: Gynecology;  Laterality: N/A;  see anterior repair  . Lower Extremity Venous Dopplers  01/09/2012   Right and left lower steroids: No evidence of thrombus or, thrombophlebitis; right and left GSV and SSV: No venous insufficiency. Normal exam.  . NM MYOVIEW LTD  11/2016   6.6 METS.  Reached 106% of max.  Heart rate.  Walk for 4:40 min.  EF 72%.  Normal blood pressure response.  upsloping ST segment depression, nonspecific.  Otherwise normal study.  LOW RISK.  No evidence of ischemia or infarction.  . TRANSTHORACIC ECHOCARDIOGRAM  01/2018   EF > 65%. LV size small. Gr 1 DD (normal for age).   Normal atrial size. Moderate Aortic Sclerosis without Stenosis. Moderate Mitral Annular Calcification with mild MR & no MS    Immunization History  Administered Date(s) Administered  . Influenza, High Dose  Seasonal PF 10/30/2013  . PFIZER(Purple Top)SARS-COV-2 Vaccination 04/26/2019, 05/20/2019  . Pneumococcal Conjugate-13 10/30/2013  . Tdap 09/25/2011    MEDICATIONS/ALLERGIES   Current Meds  Medication Sig  . acetaminophen (TYLENOL) 500 MG tablet Take 500-1,000 mg by mouth every 8 (eight) hours as needed for mild pain.   Marland Kitchen albuterol (VENTOLIN HFA) 108 (90 Base) MCG/ACT inhaler Inhale 2 puffs into the lungs every 4 (four) hours as needed for wheezing or shortness of breath.   Marland Kitchen ascorbic acid (VITAMIN C) 500 MG tablet Take 500-1,000 mg by mouth daily with breakfast.  . aspirin EC 81 MG tablet Take 1 tablet (81 mg total) by mouth daily.  . Cholecalciferol (VITAMIN D-3) 25 MCG (1000 UT) CAPS Take 1,000 Units by mouth daily with breakfast.   . ezetimibe (ZETIA) 10 MG tablet ezetimibe 10 mg tablet  . Lactobacillus Rhamnosus, GG, (CULTURELLE) CAPS Take 1 capsule by mouth every morning.   . Magnesium 250 MG TABS Take 250 mg by mouth daily as needed (leg cramps).  . metoprolol tartrate (LOPRESSOR) 25 MG tablet Take 0.5 tablets by mouth 2 (two) times daily.  . Multiple Vitamins-Minerals (AIRBORNE PO) Take 1 tablet by mouth daily with breakfast.  . Multiple Vitamins-Minerals (CENTRUM SILVER ULTRA WOMENS) TABS Take 1 tablet by mouth daily with breakfast.   . pravastatin (PRAVACHOL) 20 MG tablet Take 20 mg by mouth daily.   . Trospium Chloride 60 MG CP24 Take 60 mg by mouth daily.   Marland Kitchen zinc gluconate 50 MG tablet Patient take 50-100 mg by mouth occasionally as needed for vitamin supplement    Allergies  Allergen Reactions  . Ivp Dye [Iodinated Diagnostic Agents] Hives  . Myrbetriq [Mirabegron] Other (See Comments)    "Makes me urinate more frequently"    SOCIAL HISTORY/FAMILY HISTORY   Reviewed in Epic:  Pertinent findings:  Social History   Tobacco Use  . Smoking status: Never Smoker  . Smokeless tobacco: Never Used  Vaping Use  . Vaping Use: Never used  Substance Use Topics  . Alcohol  use: No  . Drug use: No   Social History   Social History Narrative   She is a married mother of 37, grandmother of 50, great grandmother of 54. She walks   daily about 2 miles a day, does not smoke, does not drink.     OBJCTIVE -PE, EKG, labs   Wt Readings from Last 3  Encounters:  03/17/20 129 lb (58.5 kg)  08/26/19 129 lb (58.5 kg)  02/14/19 124 lb 9 oz (56.5 kg)    Physical Exam: BP 121/71   Pulse 69   Ht 5\' 3"  (1.6 m)   Wt 129 lb (58.5 kg)   SpO2 98%   BMI 22.85 kg/m  Physical Exam Vitals reviewed.  Constitutional:      General: She is not in acute distress.    Appearance: Normal appearance. She is normal weight. She is not ill-appearing or toxic-appearing.     Comments: Remarkably well appearing for her age.  As usually, impeccably-groomed.  HENT:     Head: Normocephalic and atraumatic.  Neck:     Vascular: No carotid bruit, hepatojugular reflux or JVD.  Cardiovascular:     Rate and Rhythm: Normal rate and regular rhythm.  No extrasystoles are present.    Chest Wall: PMI is not displaced.     Pulses: Normal pulses.     Heart sounds: Heart sounds are distant. No murmur heard. No friction rub. No gallop.      Comments: Normal S1 with physiologically split S2 Pulmonary:     Effort: Pulmonary effort is normal. No respiratory distress.     Breath sounds: Normal breath sounds.  Chest:     Chest wall: No tenderness.  Musculoskeletal:        General: Swelling (Trivial bilateral left greater than right) present.     Cervical back: Normal range of motion and neck supple.  Skin:    General: Skin is warm and dry.     Comments: Mild bilateral lower extremity venous stasis changes, not significant.  Neurological:     General: No focal deficit present.     Mental Status: She is alert and oriented to person, place, and time.     Gait: Gait abnormal (Somewhat slow deliberate).  Psychiatric:        Mood and Affect: Mood normal.        Behavior: Behavior normal.         Thought Content: Thought content normal.        Judgment: Judgment normal.     Adult ECG Report  Rate: 69 ;  Rhythm: normal sinus rhythm and RBBB, LAA.  Normal axis, normal durations.;   Narrative Interpretation: Stable EKG  Recent Labs:    11/07/2019: TC 194, TG 132, HDL 58, LDL 113. Lab Results  Component Value Date   CHOL 185 02/16/2018   HDL 62 02/16/2018   LDLCALC 113 (H) 02/16/2018   TRIG 48 02/16/2018   CHOLHDL 3.0 02/16/2018   Lab Results  Component Value Date   CREATININE 1.14 (H) 02/13/2019   BUN 17 02/13/2019   NA 138 02/14/2019   K 4.3 02/14/2019   CL 103 02/13/2019   CO2 24 02/13/2019   CBC Latest Ref Rng & Units 02/14/2019 02/14/2019 02/13/2019  WBC 4.0 - 10.5 K/uL - 4.5 8.0  Hemoglobin 12.0 - 15.0 g/dL 10.5(L) 10.7(L) 11.0(L)  Hematocrit 36.0 - 46.0 % 31.0(L) 30.2(L) 31.2(L)  Platelets 150 - 400 K/uL - 191 222    Lab Results  Component Value Date   TSH 2.427 02/16/2018    ==================================================  COVID-19 Education: The signs and symptoms of COVID-19 were discussed with the patient and how to seek care for testing (follow up with PCP or arrange E-visit).   The importance of social distancing and COVID-19 vaccination was discussed today. The patient is practicing social distancing & Masking.   I spent  a total of 56minutes with the patient spent in direct patient consultation.  Additional time spent with chart review  / charting (studies, outside notes, etc): 16 min Total Time: 38 min   Current medicines are reviewed at length with the patient today.  (+/- concerns) n/a  This visit occurred during the SARS-CoV-2 public health emergency.  Safety protocols were in place, including screening questions prior to the visit, additional usage of staff PPE, and extensive cleaning of exam room while observing appropriate contact time as indicated for disinfecting solutions.  Notice: This dictation was prepared with Dragon dictation  along with smaller phrase technology. Any transcriptional errors that result from this process are unintentional and may not be corrected upon review.  Patient Instructions / Medication Changes & Studies & Tests Ordered   Patient Instructions  Medication Instructions:   Try  Using your Albuterol  Before exercise   *If you need a refill on your cardiac medications before your next appointment, please call your pharmacy*   Lab Work:  Not needed   Testing/Procedures: Not needed   Follow-Up: At [facility], you and your health needs are our priority.  As part of our continuing mission to provide you with exceptional heart care, we have created designated Provider Care Teams.  These Care Teams include your primary Cardiologist (physician) and Advanced Practice Providers (APPs -  Physician Assistants and Nurse Practitioners) who all work together to provide you with the care you need, when you need it.  We recommend signing up for the patient portal called "MyChart".  Sign up information is provided on this After Visit Summary.  MyChart is used to connect with patients for Virtual Visits (Telemedicine).  Patients are able to view lab/test results, encounter notes, upcoming appointments, etc.  Non-urgent messages can be sent to your provider as well.   To learn more about what you can do with MyChart, go to [email or web address].    Your next appointment:   4 month(s) July 2022  The format for your next appointment:   In Person  Provider:   Glenetta Hew, MD   Other Instructions     Studies Ordered:   Orders Placed This Encounter  Procedures  . EKG 12-Lead     Glenetta Hew, M.D., M.S. Interventional Cardiologist   Pager # [phone] Phone # [phone] [address]. [place], [place] [address]   Thank you for choosing [facility] at [facility]!!

## 2020-04-12 ENCOUNTER — Encounter: Payer: Self-pay | Admitting: Cardiology

## 2020-04-12 DIAGNOSIS — E785 Hyperlipidemia, unspecified: Secondary | ICD-10-CM | POA: Insufficient documentation

## 2020-04-12 NOTE — Assessment & Plan Note (Signed)
Blood pressure looks well controlled.  She is only on low-dose metoprolol for palpitations.  No change.

## 2020-04-12 NOTE — Assessment & Plan Note (Signed)
DVT is resolved.  She is back on aspirin no longer on Eliquis.  Continue to recommend staying active, foot elevation and support stockings.  So far no signs of thrombophlebitis.

## 2020-04-12 NOTE — Assessment & Plan Note (Signed)
Essentially resolved on low-dose beta-blocker.

## 2020-04-12 NOTE — Assessment & Plan Note (Addendum)
Probably still related to deconditioning and her recent Covid infection, however we talked about potentially considering a stress test if she were to get proceed progressively worse.  She indicated that she would like to hold off for now.  It sounds like she has some wheezing issues and this all began after her lung injury from [place].  My recommendation is that she uses her albuterol as needed but also prior to exercising.

## 2020-04-12 NOTE — Assessment & Plan Note (Signed)
Last LDL was 113.  Would like to see it less than 100.  She is on pravastatin and Zetia. I am leery of switching her from pravastatin to a more potent statin, but if not able to improve lipids, may want to consider low-dose rosuvastatin in place of pravastatin.

## 2020-04-21 DIAGNOSIS — R151 Fecal smearing: Secondary | ICD-10-CM | POA: Diagnosis not present

## 2020-05-29 DIAGNOSIS — R350 Frequency of micturition: Secondary | ICD-10-CM | POA: Diagnosis not present

## 2020-06-08 ENCOUNTER — Ambulatory Visit: Payer: Medicare PPO | Admitting: Podiatry

## 2020-06-09 DIAGNOSIS — N3941 Urge incontinence: Secondary | ICD-10-CM | POA: Diagnosis not present

## 2020-06-09 DIAGNOSIS — N3281 Overactive bladder: Secondary | ICD-10-CM | POA: Diagnosis not present

## 2020-06-09 DIAGNOSIS — R350 Frequency of micturition: Secondary | ICD-10-CM | POA: Diagnosis not present

## 2020-06-09 DIAGNOSIS — N390 Urinary tract infection, site not specified: Secondary | ICD-10-CM | POA: Diagnosis not present

## 2020-06-17 ENCOUNTER — Ambulatory Visit: Payer: Medicare PPO | Admitting: Podiatry

## 2020-06-22 ENCOUNTER — Other Ambulatory Visit: Payer: Self-pay

## 2020-06-22 ENCOUNTER — Encounter: Payer: Self-pay | Admitting: Podiatry

## 2020-06-22 ENCOUNTER — Ambulatory Visit (INDEPENDENT_AMBULATORY_CARE_PROVIDER_SITE_OTHER): Payer: Medicare PPO | Admitting: Podiatry

## 2020-06-22 ENCOUNTER — Ambulatory Visit (INDEPENDENT_AMBULATORY_CARE_PROVIDER_SITE_OTHER): Payer: Medicare PPO

## 2020-06-22 DIAGNOSIS — M79671 Pain in right foot: Secondary | ICD-10-CM

## 2020-06-22 DIAGNOSIS — L84 Corns and callosities: Secondary | ICD-10-CM | POA: Diagnosis not present

## 2020-06-22 DIAGNOSIS — M79672 Pain in left foot: Secondary | ICD-10-CM | POA: Diagnosis not present

## 2020-06-22 DIAGNOSIS — M778 Other enthesopathies, not elsewhere classified: Secondary | ICD-10-CM

## 2020-06-22 MED ORDER — TRIAMCINOLONE ACETONIDE 10 MG/ML IJ SUSP
20.0000 mg | Freq: Once | INTRAMUSCULAR | Status: AC
  Administered 2020-06-22: 20 mg

## 2020-06-23 NOTE — Progress Notes (Signed)
Subjective:   Patient ID: [name], female   DOB: 87 y.o.   MRN: [id number]   HPI Patient presents stating she has a lot of pain in her forefoot bilateral stating its been very inflamed making it hard for her to walk.  Does not remember significant injury associated with this stating is been hurting for a number of months.  Patient does not smoke and does like to be active if possible   Review of Systems  All other systems reviewed and are negative.       Objective:  Physical Exam Vitals and nursing note reviewed.  Constitutional:      Appearance: She is well-developed.  Pulmonary:     Effort: Pulmonary effort is normal.  Musculoskeletal:        General: Normal range of motion.  Skin:    General: Skin is warm.  Neurological:     Mental Status: She is alert.     Neurovascular status intact muscle strength found to be adequate range of motion adequate patient found to have exquisite discomfort around the second MPJ bilateral with inflammation fluid around the joint surface and pain with palpation.  Patient is noted to have good digital perfusion well oriented x3 and does have corn callus formation plantar     Assessment:  Inflammatory capsulitis of the second MPJ bilateral along with plantar keratotic lesion formation     Plan:  H&P reviewed conditions x-rays did sterile prep and injected the second MPJ periarticular 3 mg dexamethasone Kenalog 5 mg Xylocaine advised on padding therapy rigid bottom shoes anti-inflammatories and reappoint to recheck in debrided calluses as needed  X-rays were negative for signs of advanced arthritis or any other pathology associated with condition or signs of stress fracture or other bone pathology

## 2020-07-17 ENCOUNTER — Encounter: Payer: Self-pay | Admitting: Cardiology

## 2020-07-17 ENCOUNTER — Other Ambulatory Visit: Payer: Self-pay

## 2020-07-17 ENCOUNTER — Ambulatory Visit (INDEPENDENT_AMBULATORY_CARE_PROVIDER_SITE_OTHER): Payer: Medicare PPO | Admitting: Cardiology

## 2020-07-17 VITALS — BP 110/68 | HR 72 | Resp 18 | Ht 63.0 in | Wt 127.0 lb

## 2020-07-17 DIAGNOSIS — E785 Hyperlipidemia, unspecified: Secondary | ICD-10-CM | POA: Diagnosis not present

## 2020-07-17 DIAGNOSIS — R5383 Other fatigue: Secondary | ICD-10-CM | POA: Diagnosis not present

## 2020-07-17 DIAGNOSIS — R002 Palpitations: Secondary | ICD-10-CM | POA: Diagnosis not present

## 2020-07-17 DIAGNOSIS — I825Z2 Chronic embolism and thrombosis of unspecified deep veins of left distal lower extremity: Secondary | ICD-10-CM | POA: Diagnosis not present

## 2020-07-17 DIAGNOSIS — I10 Essential (primary) hypertension: Secondary | ICD-10-CM

## 2020-07-17 NOTE — Progress Notes (Signed)
Primary Care Provider: Lavone Orn, MD Cardiologist: Glenetta Hew, MD Electrophysiologist: None  Clinic Note: Chief Complaint  Patient presents with   Follow-up    Doing better.  Better energy level.  Weight loss stable     ===================================  ASSESSMENT/PLAN   Problem List Items Addressed This Visit     Essential hypertension; borderline (Chronic)    Blood pressure stable.  Continue current low-dose beta-blocker for palpitations.       Relevant Orders   EKG 12-Lead (Completed)   Palpitations - Primary (Chronic)    Pretty stable on low-dose beta-blocker       Relevant Orders   EKG 12-Lead (Completed)   DVT, lower extremity, distal, chronic (HCC) (Chronic)    Resolved on follow-up Doppler.  No signs of thrombophlebitis.  Should be back on aspirin.       Fatigue    Overall improved.  Weight improved.  Energy level improved.       Dyslipidemia, goal LDL below 100 (Chronic)    LDL 113.  Stable on current dose of 20 mg pravastatin.  Would not be more aggressive.        ===================================  HPI:    [name] is a 87 y.o. female with a PMH notable for HTN, mild venous stasis edema and history of DVT, with intermittent palpitations who presents today for 22-month follow-up.  [name] was last seen on 03/17/2020 -> noted more fatigue than usual.  Has also lost about 11 pounds.  She is physically worn out and mostly spending, husband of 76 years old not doing well with dementia and failure to thrive.  Noticing more prominent exertional dyspnea and fatigue.Shortness of breath with overexertion.Started to have some forgetfulness and insomnia. --> Recommended using albuterol before exercise.  Short follow-up.  Recent Hospitalizations: None  Reviewed  CV studies:    The following studies were reviewed today: (if available, images/films reviewed: From Epic Chart or Care Everywhere) Lower Extremity Venous Dopplers: No DVT.   Resolution of mild peroneal vein DVT with mild superficial edema in the ankle.  Interval History:   [name] returns today overall feeling better.  She says her energy level has improvedShe is trying to the best she can to stabilize her diet and has not lost any more weight. She really has been try to eat a more healthy diet because of her husband.   She says that every once in a while she may feel about 2 to 10 seconds worth of palpitations and fluttering.  Nothing really associated with any lightheaded dizziness or anything.  Just something that bothers her every now and then.  She does note that her balance is a little bit unsteady and she sometimes has to hold onto things but she is walking almost up to a mile now if she can get steady with a walker.  CV Review of Symptoms (Summary) Cardiovascular ROS: no chest pain or dyspnea on exertion positive for - palpitations and Somewhat improved but still decreased energy level.  Exercise intolerance more because of poor balance. negative for - orthopnea, paroxysmal nocturnal dyspnea, rapid heart rate, shortness of breath, or Syncope/near syncope or TIA/amaurosis fugax, claudication  REVIEWED OF SYSTEMS   Review of Systems  Constitutional:  Positive for malaise/fatigue (Improved, but not back to baseline). Negative for weight loss (Now stabilized).  HENT:  Negative for nosebleeds.   Respiratory:  Negative for shortness of breath.   Cardiovascular:  Negative for leg swelling.  Per HPI  Gastrointestinal:  Negative for abdominal pain, blood in stool and melena.  Genitourinary:  Negative for hematuria.  Musculoskeletal:  Positive for joint pain.  Neurological:  Negative for dizziness (More just an unsteady gait.) and weakness.  Psychiatric/Behavioral:  Positive for memory loss (Mild). Negative for depression. The patient does not have insomnia.    I have reviewed and (if needed) personally updated the patient's problem list,  medications, allergies, past medical and surgical history, social and family history.   PAST MEDICAL HISTORY   Past Medical History:  Diagnosis Date   Abnormal Pap smear 1975-76   Anemia    history of   Arthritis    spine   Breast cyst, left 1980   Cystocele 2012   Diverticulosis    with h/o Diverticulitis   GERD (gastroesophageal reflux disease)    H/O hypercholesterolemia    H/O osteopenia    H/O varicella    H/O varicose veins    Hx of Mumps    Macular degeneration    Seasonal allergies    Urge incontinence 2012   Urinary frequency 2010    PAST SURGICAL HISTORY   Past Surgical History:  Procedure Laterality Date   ABDOMINAL HYSTERECTOMY     BLADDER SUSPENSION N/A 06/16/2016   Procedure: TRANSVAGINAL TAPE (TVT) PROCEDURE;  Surgeon: Everett Graff, MD;  Location: [facility];  Service: Gynecology;  Laterality: N/A;   BREAST CYST ASPIRATION  1964   COLONOSCOPY     Dr. Jodene Nam REPAIR N/A 06/16/2016   Procedure: ANTERIOR REPAIR (CYSTOCELE);  Surgeon: Everett Graff, MD;  Location: [facility];  Service: Gynecology;  Laterality: N/A;   CYSTOSCOPY N/A 06/16/2016   Procedure: CYSTOSCOPY;  Surgeon: Everett Graff, MD;  Location: [facility];  Service: Gynecology;  Laterality: N/A;  see anterior repair   Lower Extremity Venous Dopplers  01/09/2012   Right and left lower steroids: No evidence of thrombus or, thrombophlebitis; right and left GSV and SSV: No venous insufficiency. Normal exam.   NM MYOVIEW LTD  11/2016   6.6 METS.  Reached 106% of max.  Heart rate.  Walk for 4:40 min.  EF 72%.  Normal blood pressure response.  upsloping ST segment depression, nonspecific.  Otherwise normal study.  LOW RISK.  No evidence of ischemia or infarction.   TRANSTHORACIC ECHOCARDIOGRAM  01/2018   EF > 65%. LV size small. Gr 1 DD (normal for age).   Normal atrial size. Moderate Aortic Sclerosis without Stenosis. Moderate Mitral Annular Calcification with mild MR & no MS    Immunization  History  Administered Date(s) Administered   Influenza, High Dose Seasonal PF 10/30/2013   PFIZER(Purple Top)SARS-COV-2 Vaccination 04/26/2019, 05/20/2019   Pneumococcal Conjugate-13 10/30/2013   Tdap 09/25/2011    MEDICATIONS/ALLERGIES   Current Meds  Medication Sig   acetaminophen (TYLENOL) 500 MG tablet Take 500-1,000 mg by mouth every 8 (eight) hours as needed for mild pain.    albuterol (VENTOLIN HFA) 108 (90 Base) MCG/ACT inhaler Inhale 2 puffs into the lungs every 4 (four) hours as needed for wheezing or shortness of breath.    ascorbic acid (VITAMIN C) 500 MG tablet Take 500-1,000 mg by mouth daily with breakfast.   Cholecalciferol (VITAMIN D-3) 25 MCG (1000 UT) CAPS Take 1,000 Units by mouth daily with breakfast.    ezetimibe (ZETIA) 10 MG tablet ezetimibe 10 mg tablet   Lactobacillus Rhamnosus, GG, (CULTURELLE) CAPS Take 1 capsule by mouth every morning.    Magnesium 250 MG TABS Take  250 mg by mouth daily as needed (leg cramps).   metoprolol tartrate (LOPRESSOR) 25 MG tablet Take 0.5 tablets by mouth 2 (two) times daily.   Multiple Vitamins-Minerals (AIRBORNE PO) Take 1 tablet by mouth daily with breakfast.   Multiple Vitamins-Minerals (CENTRUM SILVER ULTRA WOMENS) TABS Take 1 tablet by mouth daily with breakfast.    pravastatin (PRAVACHOL) 20 MG tablet Take 20 mg by mouth daily.    [DISCONTINUED] Trospium Chloride 60 MG CP24 Take 60 mg by mouth daily.    [DISCONTINUED] zinc gluconate 50 MG tablet Patient take 50-100 mg by mouth occasionally as needed for vitamin supplement    Allergies  Allergen Reactions   Ivp Dye [Iodinated Diagnostic Agents] Hives   Myrbetriq [Mirabegron] Other (See Comments)    "Makes me urinate more frequently"    SOCIAL HISTORY/FAMILY HISTORY   Reviewed in Epic:  Pertinent findings:  Social History   Tobacco Use   Smoking status: Never   Smokeless tobacco: Never  Vaping Use   Vaping Use: Never used  Substance Use Topics   Alcohol use: No    Drug use: No   Social History   Social History Narrative   She is a married mother of 80, grandmother of 23, great grandmother of 53. She walks   daily about 2 miles a day, does not smoke, does not drink.     OBJCTIVE -PE, EKG, labs   Wt Readings from Last 3 Encounters:  07/17/20 127 lb (57.6 kg)  03/17/20 129 lb (58.5 kg)  08/26/19 129 lb (58.5 kg)  Weights have stabilized somewhat.  She has been trying to eat a good healthy diet.  Physical Exam: BP 110/68 (BP Location: Left Arm, Patient Position: Sitting, Cuff Size: Normal)   Pulse 72   Resp 18   Ht 5\' 3"  (1.6 m)   Wt 127 lb (57.6 kg)   SpO2 95%   BMI 22.50 kg/m  Physical Exam Constitutional:      General: She is not in acute distress.    Appearance: Normal appearance. She is normal weight. She is not ill-appearing or toxic-appearing.     Comments: Somewhat thinner than last visit, but doing well.  HENT:     Head: Normocephalic and atraumatic.  Neck:     Vascular: No carotid bruit, hepatojugular reflux or JVD.  Cardiovascular:     Rate and Rhythm: Normal rate and regular rhythm. No extrasystoles are present.    Chest Wall: PMI is not displaced.     Pulses: Normal pulses.     Heart sounds: S1 normal. No murmur heard.   No friction rub. No gallop.     Comments: Physiologically split S2 Pulmonary:     Effort: Pulmonary effort is normal.     Breath sounds: Normal breath sounds.  Chest:     Chest wall: No tenderness.  Abdominal:     General: Abdomen is flat.     Palpations: Abdomen is soft.  Musculoskeletal:        General: No swelling. Normal range of motion.     Cervical back: Normal range of motion and neck supple.  Neurological:     General: No focal deficit present.     Mental Status: She is alert and oriented to person, place, and time. Mental status is at baseline.  Psychiatric:        Mood and Affect: Mood normal.        Behavior: Behavior normal.        Thought Content: Thought  content normal.         Judgment: Judgment normal.  \  Adult ECG Report  Rate: 72 ;  Rhythm: normal sinus rhythm and RBBB. ;   Narrative Interpretation: Otherwise normal EKG  Recent Labs:    11/07/2019: TC 194, HDL  58, LDL 113, TG 132. Lab Results  Component Value Date   CHOL 185 02/16/2018   HDL 62 02/16/2018   LDLCALC 113 (H) 02/16/2018   TRIG 48 02/16/2018   CHOLHDL 3.0 02/16/2018   Lab Results  Component Value Date   CREATININE 1.14 (H) 02/13/2019   BUN 17 02/13/2019   NA 138 02/14/2019   K 4.3 02/14/2019   CL 103 02/13/2019   CO2 24 02/13/2019   CBC Latest Ref Rng & Units 02/14/2019 02/14/2019 02/13/2019  WBC 4.0 - 10.5 K/uL - 4.5 8.0  Hemoglobin 12.0 - 15.0 g/dL 10.5(L) 10.7(L) 11.0(L)  Hematocrit 36.0 - 46.0 % 31.0(L) 30.2(L) 31.2(L)  Platelets 150 - 400 K/uL - 191 222    Lab Results  Component Value Date   TSH 2.427 02/16/2018    ==================================================  COVID-19 Education: The signs and symptoms of COVID-19 were discussed with the patient and how to seek care for testing (follow up with PCP or arrange E-visit).    I spent a total of 27 min with the patient spent in direct patient consultation.  Additional time spent with chart review  / charting (studies, outside notes, etc): 11 min Total Time: 38 min  Current medicines are reviewed at length with the patient today.  (+/- concerns) n/a  This visit occurred during the SARS-CoV-2 public health emergency.  Safety protocols were in place, including screening questions prior to the visit, additional usage of staff PPE, and extensive cleaning of exam room while observing appropriate contact time as indicated for disinfecting solutions.  Notice: This dictation was prepared with Dragon dictation along with smaller phrase technology. Any transcriptional errors that result from this process are unintentional and may not be corrected upon review.  Patient Instructions / Medication Changes & Studies & Tests Ordered    Patient Instructions  Medication Instructions:  No changes  *If you need a refill on your cardiac medications before your next appointment, please call your pharmacy*   Lab Work: Not needed   Testing/Procedures: Not needed   Follow-Up: At [facility], you and your health needs are our priority.  As part of our continuing mission to provide you with exceptional heart care, we have created designated Provider Care Teams.  These Care Teams include your primary Cardiologist (physician) and Advanced Practice Providers (APPs -  Physician Assistants and Nurse Practitioners) who all work together to provide you with the care you need, when you need it.  We recommend signing up for the patient portal called "MyChart".  Sign up information is provided on this After Visit Summary.  MyChart is used to connect with patients for Virtual Visits (Telemedicine).  Patients are able to view lab/test results, encounter notes, upcoming appointments, etc.  Non-urgent messages can be sent to your provider as well.   To learn more about what you can do with MyChart, go to [email or web address].    Your next appointment:    6 to 7 month(s)  The format for your next appointment:   In Person  Provider:   Glenetta Hew, MD      Studies Ordered:   Orders Placed This Encounter  Procedures   EKG 12-Lead     Glenetta Hew, M.D., M.S.  Interventional Cardiologist   Pager # [phone] Phone # [phone] [address] [place], [place] [address]   Thank you for choosing Heartcare at [facility]!!

## 2020-07-17 NOTE — Patient Instructions (Signed)
Medication Instructions:  No changes  *If you need a refill on your cardiac medications before your next appointment, please call your pharmacy*   Lab Work: Not needed   Testing/Procedures: Not needed   Follow-Up: At [facility], you and your health needs are our priority.  As part of our continuing mission to provide you with exceptional heart care, we have created designated Provider Care Teams.  These Care Teams include your primary Cardiologist (physician) and Advanced Practice Providers (APPs -  Physician Assistants and Nurse Practitioners) who all work together to provide you with the care you need, when you need it.  We recommend signing up for the patient portal called "MyChart".  Sign up information is provided on this After Visit Summary.  MyChart is used to connect with patients for Virtual Visits (Telemedicine).  Patients are able to view lab/test results, encounter notes, upcoming appointments, etc.  Non-urgent messages can be sent to your provider as well.   To learn more about what you can do with MyChart, go to [email or web address].    Your next appointment:    6 to 7 month(s)  The format for your next appointment:   In Person  Provider:   Glenetta Hew, MD

## 2020-07-22 ENCOUNTER — Encounter (INDEPENDENT_AMBULATORY_CARE_PROVIDER_SITE_OTHER): Payer: Medicare PPO | Admitting: Ophthalmology

## 2020-07-30 ENCOUNTER — Other Ambulatory Visit: Payer: Self-pay

## 2020-07-30 ENCOUNTER — Encounter (INDEPENDENT_AMBULATORY_CARE_PROVIDER_SITE_OTHER): Payer: Medicare PPO | Admitting: Ophthalmology

## 2020-07-30 DIAGNOSIS — I10 Essential (primary) hypertension: Secondary | ICD-10-CM

## 2020-07-30 DIAGNOSIS — H43813 Vitreous degeneration, bilateral: Secondary | ICD-10-CM

## 2020-07-30 DIAGNOSIS — H353122 Nonexudative age-related macular degeneration, left eye, intermediate dry stage: Secondary | ICD-10-CM | POA: Diagnosis not present

## 2020-07-30 DIAGNOSIS — D3132 Benign neoplasm of left choroid: Secondary | ICD-10-CM | POA: Diagnosis not present

## 2020-07-30 DIAGNOSIS — H35033 Hypertensive retinopathy, bilateral: Secondary | ICD-10-CM

## 2020-07-30 DIAGNOSIS — H353211 Exudative age-related macular degeneration, right eye, with active choroidal neovascularization: Secondary | ICD-10-CM | POA: Diagnosis not present

## 2020-08-01 ENCOUNTER — Encounter: Payer: Self-pay | Admitting: Cardiology

## 2020-08-01 DIAGNOSIS — R5383 Other fatigue: Secondary | ICD-10-CM | POA: Insufficient documentation

## 2020-08-01 NOTE — Assessment & Plan Note (Signed)
Pretty stable on low-dose beta-blocker

## 2020-08-01 NOTE — Assessment & Plan Note (Signed)
Resolved on follow-up Doppler.  No signs of thrombophlebitis.  Should be back on aspirin.

## 2020-08-01 NOTE — Assessment & Plan Note (Signed)
Overall improved.  Weight improved.  Energy level improved.

## 2020-08-01 NOTE — Assessment & Plan Note (Signed)
LDL 113.  Stable on current dose of 20 mg pravastatin.  Would not be more aggressive.

## 2020-08-01 NOTE — Assessment & Plan Note (Signed)
Blood pressure stable.  Continue current low-dose beta-blocker for palpitations.

## 2020-08-31 DIAGNOSIS — E785 Hyperlipidemia, unspecified: Secondary | ICD-10-CM | POA: Diagnosis not present

## 2020-08-31 DIAGNOSIS — N3941 Urge incontinence: Secondary | ICD-10-CM | POA: Diagnosis not present

## 2020-08-31 DIAGNOSIS — I10 Essential (primary) hypertension: Secondary | ICD-10-CM | POA: Diagnosis not present

## 2020-08-31 DIAGNOSIS — N3281 Overactive bladder: Secondary | ICD-10-CM | POA: Diagnosis not present

## 2020-08-31 DIAGNOSIS — Z91041 Radiographic dye allergy status: Secondary | ICD-10-CM | POA: Diagnosis not present

## 2020-08-31 DIAGNOSIS — Z809 Family history of malignant neoplasm, unspecified: Secondary | ICD-10-CM | POA: Diagnosis not present

## 2020-08-31 DIAGNOSIS — Z8673 Personal history of transient ischemic attack (TIA), and cerebral infarction without residual deficits: Secondary | ICD-10-CM | POA: Diagnosis not present

## 2020-09-14 DIAGNOSIS — Z87440 Personal history of urinary (tract) infections: Secondary | ICD-10-CM | POA: Diagnosis not present

## 2020-09-14 DIAGNOSIS — R351 Nocturia: Secondary | ICD-10-CM | POA: Diagnosis not present

## 2020-09-14 DIAGNOSIS — N3941 Urge incontinence: Secondary | ICD-10-CM | POA: Diagnosis not present

## 2020-09-14 DIAGNOSIS — N3281 Overactive bladder: Secondary | ICD-10-CM | POA: Diagnosis not present

## 2020-10-20 DIAGNOSIS — Z800 Family history of malignant neoplasm of digestive organs: Secondary | ICD-10-CM | POA: Diagnosis not present

## 2020-10-20 DIAGNOSIS — R151 Fecal smearing: Secondary | ICD-10-CM | POA: Diagnosis not present

## 2020-10-21 ENCOUNTER — Other Ambulatory Visit: Payer: Self-pay | Admitting: Internal Medicine

## 2020-10-21 DIAGNOSIS — R194 Change in bowel habit: Secondary | ICD-10-CM

## 2020-10-21 DIAGNOSIS — R159 Full incontinence of feces: Secondary | ICD-10-CM

## 2020-10-21 DIAGNOSIS — Z800 Family history of malignant neoplasm of digestive organs: Secondary | ICD-10-CM

## 2020-11-09 DIAGNOSIS — R159 Full incontinence of feces: Secondary | ICD-10-CM | POA: Diagnosis not present

## 2020-11-09 DIAGNOSIS — K6289 Other specified diseases of anus and rectum: Secondary | ICD-10-CM | POA: Diagnosis not present

## 2020-11-09 DIAGNOSIS — R151 Fecal smearing: Secondary | ICD-10-CM | POA: Diagnosis not present

## 2020-11-12 DIAGNOSIS — Z23 Encounter for immunization: Secondary | ICD-10-CM | POA: Diagnosis not present

## 2020-11-12 DIAGNOSIS — M810 Age-related osteoporosis without current pathological fracture: Secondary | ICD-10-CM | POA: Diagnosis not present

## 2020-11-12 DIAGNOSIS — Z0000 Encounter for general adult medical examination without abnormal findings: Secondary | ICD-10-CM | POA: Diagnosis not present

## 2020-11-12 DIAGNOSIS — Z1389 Encounter for screening for other disorder: Secondary | ICD-10-CM | POA: Diagnosis not present

## 2020-11-12 DIAGNOSIS — R0609 Other forms of dyspnea: Secondary | ICD-10-CM | POA: Diagnosis not present

## 2020-11-12 DIAGNOSIS — R159 Full incontinence of feces: Secondary | ICD-10-CM | POA: Diagnosis not present

## 2020-11-12 DIAGNOSIS — K580 Irritable bowel syndrome with diarrhea: Secondary | ICD-10-CM | POA: Diagnosis not present

## 2020-11-12 DIAGNOSIS — Z8673 Personal history of transient ischemic attack (TIA), and cerebral infarction without residual deficits: Secondary | ICD-10-CM | POA: Diagnosis not present

## 2020-11-12 DIAGNOSIS — N3281 Overactive bladder: Secondary | ICD-10-CM | POA: Diagnosis not present

## 2020-11-12 DIAGNOSIS — N1831 Chronic kidney disease, stage 3a: Secondary | ICD-10-CM | POA: Diagnosis not present

## 2021-01-07 DIAGNOSIS — M65331 Trigger finger, right middle finger: Secondary | ICD-10-CM | POA: Diagnosis not present

## 2021-01-13 DIAGNOSIS — Z1231 Encounter for screening mammogram for malignant neoplasm of breast: Secondary | ICD-10-CM | POA: Diagnosis not present

## 2021-02-01 ENCOUNTER — Other Ambulatory Visit: Payer: Self-pay

## 2021-02-01 ENCOUNTER — Encounter (INDEPENDENT_AMBULATORY_CARE_PROVIDER_SITE_OTHER): Payer: Medicare PPO | Admitting: Ophthalmology

## 2021-02-01 DIAGNOSIS — H43813 Vitreous degeneration, bilateral: Secondary | ICD-10-CM | POA: Diagnosis not present

## 2021-02-01 DIAGNOSIS — I10 Essential (primary) hypertension: Secondary | ICD-10-CM

## 2021-02-01 DIAGNOSIS — D3132 Benign neoplasm of left choroid: Secondary | ICD-10-CM

## 2021-02-01 DIAGNOSIS — H35033 Hypertensive retinopathy, bilateral: Secondary | ICD-10-CM

## 2021-02-01 DIAGNOSIS — H353211 Exudative age-related macular degeneration, right eye, with active choroidal neovascularization: Secondary | ICD-10-CM | POA: Diagnosis not present

## 2021-02-01 DIAGNOSIS — H353122 Nonexudative age-related macular degeneration, left eye, intermediate dry stage: Secondary | ICD-10-CM

## 2021-02-02 DIAGNOSIS — R159 Full incontinence of feces: Secondary | ICD-10-CM | POA: Diagnosis not present

## 2021-02-02 DIAGNOSIS — Z01419 Encounter for gynecological examination (general) (routine) without abnormal findings: Secondary | ICD-10-CM | POA: Diagnosis not present

## 2021-02-02 DIAGNOSIS — N6459 Other signs and symptoms in breast: Secondary | ICD-10-CM | POA: Diagnosis not present

## 2021-02-04 DIAGNOSIS — M65331 Trigger finger, right middle finger: Secondary | ICD-10-CM | POA: Diagnosis not present

## 2021-02-08 ENCOUNTER — Other Ambulatory Visit: Payer: Self-pay | Admitting: Obstetrics and Gynecology

## 2021-02-08 DIAGNOSIS — N6310 Unspecified lump in the right breast, unspecified quadrant: Secondary | ICD-10-CM

## 2021-02-08 DIAGNOSIS — N6459 Other signs and symptoms in breast: Secondary | ICD-10-CM

## 2021-03-01 DIAGNOSIS — M810 Age-related osteoporosis without current pathological fracture: Secondary | ICD-10-CM | POA: Diagnosis not present

## 2021-03-03 DIAGNOSIS — H40013 Open angle with borderline findings, low risk, bilateral: Secondary | ICD-10-CM | POA: Diagnosis not present

## 2021-03-03 DIAGNOSIS — H353212 Exudative age-related macular degeneration, right eye, with inactive choroidal neovascularization: Secondary | ICD-10-CM | POA: Diagnosis not present

## 2021-03-03 DIAGNOSIS — H35033 Hypertensive retinopathy, bilateral: Secondary | ICD-10-CM | POA: Diagnosis not present

## 2021-03-03 DIAGNOSIS — H353122 Nonexudative age-related macular degeneration, left eye, intermediate dry stage: Secondary | ICD-10-CM | POA: Diagnosis not present

## 2021-03-08 ENCOUNTER — Other Ambulatory Visit: Payer: Self-pay

## 2021-03-08 ENCOUNTER — Encounter (INDEPENDENT_AMBULATORY_CARE_PROVIDER_SITE_OTHER): Payer: Medicare PPO | Admitting: Ophthalmology

## 2021-03-08 DIAGNOSIS — H353231 Exudative age-related macular degeneration, bilateral, with active choroidal neovascularization: Secondary | ICD-10-CM | POA: Diagnosis not present

## 2021-03-08 DIAGNOSIS — H43813 Vitreous degeneration, bilateral: Secondary | ICD-10-CM | POA: Diagnosis not present

## 2021-03-08 DIAGNOSIS — I10 Essential (primary) hypertension: Secondary | ICD-10-CM

## 2021-03-08 DIAGNOSIS — H35033 Hypertensive retinopathy, bilateral: Secondary | ICD-10-CM | POA: Diagnosis not present

## 2021-03-10 ENCOUNTER — Ambulatory Visit: Payer: Medicare PPO | Admitting: Cardiology

## 2021-03-15 DIAGNOSIS — R159 Full incontinence of feces: Secondary | ICD-10-CM | POA: Diagnosis not present

## 2021-03-17 DIAGNOSIS — Z87440 Personal history of urinary (tract) infections: Secondary | ICD-10-CM | POA: Diagnosis not present

## 2021-03-17 DIAGNOSIS — N3281 Overactive bladder: Secondary | ICD-10-CM | POA: Diagnosis not present

## 2021-03-17 DIAGNOSIS — R350 Frequency of micturition: Secondary | ICD-10-CM | POA: Diagnosis not present

## 2021-03-17 DIAGNOSIS — N3941 Urge incontinence: Secondary | ICD-10-CM | POA: Diagnosis not present

## 2021-03-17 DIAGNOSIS — R351 Nocturia: Secondary | ICD-10-CM | POA: Diagnosis not present

## 2021-03-19 ENCOUNTER — Encounter: Payer: Self-pay | Admitting: Cardiology

## 2021-03-19 ENCOUNTER — Other Ambulatory Visit: Payer: Self-pay

## 2021-03-19 ENCOUNTER — Ambulatory Visit: Payer: Medicare PPO | Admitting: Cardiology

## 2021-03-19 VITALS — BP 120/70 | HR 67 | Ht 63.0 in | Wt 123.6 lb

## 2021-03-19 DIAGNOSIS — R002 Palpitations: Secondary | ICD-10-CM

## 2021-03-19 DIAGNOSIS — Z86718 Personal history of other venous thrombosis and embolism: Secondary | ICD-10-CM

## 2021-03-19 DIAGNOSIS — R634 Abnormal weight loss: Secondary | ICD-10-CM

## 2021-03-19 DIAGNOSIS — I10 Essential (primary) hypertension: Secondary | ICD-10-CM | POA: Diagnosis not present

## 2021-03-19 DIAGNOSIS — E785 Hyperlipidemia, unspecified: Secondary | ICD-10-CM

## 2021-03-19 NOTE — Patient Instructions (Addendum)
Medication Instructions:  ?No changes ?*If you need a refill on your cardiac medications before your next appointment, please call your pharmacy* ? ? ?Lab Work: ?Not needed ? ? ? ? ?Testing/Procedures: ?Not needed ? ? ?Follow-Up: ?At [facility], you and your health needs are our priority.  As part of our continuing mission to provide you with exceptional heart care, we have created designated Provider Care Teams.  These Care Teams include your primary Cardiologist (physician) and Advanced Practice Providers (APPs -  Physician Assistants and Nurse Practitioners) who all work together to provide you with the care you need, when you need it. ? ?We recommend signing up for the patient portal called "MyChart".  Sign up information is provided on this After Visit Summary.  MyChart is used to connect with patients for Virtual Visits (Telemedicine).  Patients are able to view lab/test results, encounter notes, upcoming appointments, etc.  Non-urgent messages can be sent to your provider as well.   ?To learn more about what you can do with MyChart, go to [email or web address].   ? ?Your next appointment:   ?12 month(s) ? ?The format for your next appointment:   ?In Person ? ?Provider:   ?Glenetta Hew, MD   ? ? ?Other Instructions ?Recommend you eat more protein  ?Like  eggs, lean meats  ?Protein shakes  ?

## 2021-03-19 NOTE — Progress Notes (Unsigned)
Primary Care Provider: Lavone Orn, MD Cardiologist: Glenetta Hew, MD Electrophysiologist: None  Clinic Note: No chief complaint on file.   ===================================  ASSESSMENT/PLAN   Problem List Items Addressed This Visit       Cardiology Problems   Essential hypertension; borderline - Primary (Chronic)   Dyslipidemia, goal LDL below 100 (Chronic)     Other   Palpitations (Chronic)    ===================================  HPI:    [name] is a 88 y.o. female with a PMH notable for hyperlipidemia, hypertension and palpitations as well as lower extremity DVT who presents today for 31-month follow-up.  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  Blood pressures were stable.  Palpitations are stable on current dose of beta-blocker and once in a while about 2 to 10 seconds of fluttering in her chest.  Nothing that bothers her..  She had had a DVT that was resolved with no signs of thrombophlebitis.  No longer on DOAC.  Was on aspirin.  Energy level improved mild balance issues/unsteady gait..  Lipids relatively stable on low-dose pravastatin.  Recent Hospitalizations: None  Reviewed  CV studies:    The following studies were reviewed today: (if available, images/films reviewed: From Epic Chart or Care Everywhere) None:  Interval History:   [name] returns today for 33-month follow-up overall doing pretty well.  She is little bit concerned that she is now losing weight again.  She is down about another 4 pounds since her last visit.  She says that she does not really think she is eating less than she had.  She also notes that since having COVID last spring that she has had some progression in her exertional dyspnea.  Not able  to do 2 miles anymore without stopping.  She can only walk about half a mile now.  Denies any chest pain or pressure.  No PND orthopnea with trivial swelling.  She has purplish discoloration of the ankles that is somewhat concerning to her had been explaining that this is likely stasis dermatitis. She usually wears her support socks but is not wearing them today.  She does think the pravastatin makes her feel a little bit funny in the head and has some muscle aches.  She does not necessarily take it every single day.  I said that as well as her cholesterol levels improved with simple low dosing, she is probably okay not taking it every day.  No PND or orthopnea.  Rare fluttering palpitations but nothing significant.  CV Review of Symptoms (Summary) Cardiovascular ROS: positive for - edema, irregular heartbeat, and more notable exercise fatigue. negative for - chest pain, dyspnea on exertion, orthopnea, palpitations, rapid heart rate, shortness of breath, or syncope/near syncope or TIA/MR severe, claudication  REVIEWED OF SYSTEMS   Review of Systems  Constitutional:  Positive for weight loss (Concerned about continued weight loss).  HENT:  Negative for congestion and nosebleeds.   Respiratory:  Positive for hemoptysis and shortness of breath.   Cardiovascular:  Positive for leg swelling (Ankle swelling).  Gastrointestinal:  Negative for blood in stool and melena.  Genitourinary:  Negative for flank pain and hematuria.  Musculoskeletal:  Positive for joint pain.  Neurological:  Negative for dizziness, focal weakness and weakness.  Psychiatric/Behavioral:  The patient is not nervous/anxious.   Malaise; joint pain, unsteady gait, mild memory loss.  I have reviewed and (if needed) personally updated the patient's problem list, medications, allergies, past medical and surgical history, social and family history.   PAST MEDICAL HISTORY   Past Medical History:  Diagnosis Date   Abnormal Pap  smear 1975-76   Anemia    history of   Arthritis    spine   Breast cyst, left 1980   Cystocele 2012   Diverticulosis    with h/o Diverticulitis   GERD (gastroesophageal reflux disease)    H/O hypercholesterolemia    H/O osteopenia    H/O varicella    H/O varicose veins    Hx of Mumps    Macular degeneration    Seasonal allergies    Urge incontinence 2012   Urinary frequency 2010    PAST SURGICAL HISTORY   Past Surgical History:  Procedure Laterality Date   ABDOMINAL HYSTERECTOMY     BLADDER SUSPENSION N/A 06/16/2016   Procedure: TRANSVAGINAL TAPE (TVT) PROCEDURE;  Surgeon: Everett Graff, MD;  Location: [facility];  Service: Gynecology;  Laterality: N/A;   BREAST CYST ASPIRATION  1964   COLONOSCOPY     Dr. Jodene Nam REPAIR N/A 06/16/2016   Procedure: ANTERIOR REPAIR (CYSTOCELE);  Surgeon: Everett Graff, MD;  Location: [facility];  Service: Gynecology;  Laterality: N/A;   CYSTOSCOPY N/A 06/16/2016   Procedure: CYSTOSCOPY;  Surgeon: Everett Graff, MD;  Location: [facility];  Service: Gynecology;  Laterality: N/A;  see anterior repair   Lower Extremity Venous Dopplers  01/09/2012   Right and left lower steroids: No evidence of thrombus or, thrombophlebitis; right and left GSV and SSV: No venous insufficiency. Normal exam.   NM MYOVIEW LTD  11/2016   6.6 METS.  Reached 106% of max.  Heart rate.  Walk for 4:40 min.  EF 72%.  Normal blood pressure response.  upsloping ST segment depression, nonspecific.  Otherwise normal study.  LOW RISK.  No evidence of ischemia or infarction.   TRANSTHORACIC ECHOCARDIOGRAM  01/2018   EF > 65%. LV size small. Gr 1 DD (normal for age).   Normal atrial size. Moderate Aortic Sclerosis without Stenosis. Moderate Mitral Annular Calcification with mild MR & no MS    Immunization History  Administered Date(s) Administered   Influenza, High Dose Seasonal PF 10/30/2013   PFIZER(Purple Top)SARS-COV-2 Vaccination 04/26/2019, 05/20/2019   Pneumococcal  Conjugate-13 10/30/2013   Tdap 09/25/2011    MEDICATIONS/ALLERGIES   Current Meds  Medication Sig   acetaminophen (TYLENOL) 500 MG tablet Take 500-1,000 mg by mouth every 8 (eight) hours as needed for mild pain.    albuterol (VENTOLIN HFA) 108 (90 Base) MCG/ACT inhaler Inhale 2 puffs into the lungs every 4 (four) hours as needed for wheezing or shortness of breath.    ascorbic acid (VITAMIN C) 500 MG tablet Take 500-1,000 mg by mouth daily with breakfast.   Cholecalciferol (VITAMIN D-3) 25 MCG (1000 UT) CAPS Take 1,000 Units by mouth daily with breakfast.    ezetimibe (ZETIA) 10 MG tablet ezetimibe 10 mg tablet   Lactobacillus Rhamnosus, GG, (CULTURELLE) CAPS Take 1 capsule by mouth every morning.    Magnesium 250 MG TABS Take 250 mg by mouth daily as needed (leg cramps).   metoprolol tartrate (LOPRESSOR) 25 MG tablet Take 0.5 tablets by mouth 2 (two) times daily.   Multiple Vitamins-Minerals (AIRBORNE PO) Take 1 tablet by mouth daily with breakfast.  Multiple Vitamins-Minerals (CENTRUM SILVER ULTRA WOMENS) TABS Take 1 tablet by mouth daily with breakfast.    pravastatin (PRAVACHOL) 20 MG tablet Take 20 mg by mouth daily.    Saccharomyces boulardii (PROBIOTIC) 250 MG CAPS Take by mouth daily. Pt isn't sure of the dosage    Allergies  Allergen Reactions   Atorvastatin Other (See Comments)   Desmopressin Acetate Other (See Comments)   Iodinated Contrast Media Hives and Other (See Comments)   Myrbetriq [Mirabegron] Other (See Comments)    "Makes me urinate more frequently"   Iodine Rash    SOCIAL HISTORY/FAMILY HISTORY   Reviewed in Epic:  Pertinent findings:  Social History   Tobacco Use   Smoking status: Never   Smokeless tobacco: Never  Vaping Use   Vaping Use: Never used  Substance Use Topics   Alcohol use: No   Drug use: No   Social History   Social History Narrative   She is a married mother of 85, grandmother of 71, great grandmother of 17. She walks   daily about  2 miles a day, does not smoke, does not drink.     OBJCTIVE -PE, EKG, labs   Wt Readings from Last 3 Encounters:  03/19/21 123 lb 9.6 oz (56.1 kg)  07/17/20 127 lb (57.6 kg)  03/17/20 129 lb (58.5 kg)    Physical Exam: BP 120/70 (BP Location: Left Arm, Patient Position: Sitting)    Pulse 67    Ht 5\' 3"  (1.6 m)    Wt 123 lb 9.6 oz (56.1 kg)    SpO2 98%    BMI 21.89 kg/m  Physical Exam Constitutional:      General: She is not in acute distress.    Appearance: Normal appearance. She is normal weight. She is not ill-appearing or toxic-appearing.  HENT:     Head: Normocephalic and atraumatic.  Cardiovascular:     Rate and Rhythm: Normal rate and regular rhythm. Occasional Extrasystoles are present.    Pulses: Normal pulses and intact distal pulses.     Heart sounds: Normal heart sounds. No murmur heard.   No gallop.     Comments: Physiologically split S2 Pulmonary:     Effort: Pulmonary effort is normal. No respiratory distress.     Breath sounds: Normal breath sounds. No wheezing, rhonchi or rales.  Chest:     Chest wall: No tenderness.  Musculoskeletal:        General: No swelling (trivial L>R Ankle swelling woth Venous Stasis changes). Normal range of motion.  Skin:    General: Skin is warm and dry.  Neurological:     General: No focal deficit present.     Mental Status: She is alert and oriented to person, place, and time.     Gait: Gait normal.  Psychiatric:        Mood and Affect: Mood normal.        Behavior: Behavior normal.        Thought Content: Thought content normal.        Judgment: Judgment normal.     Adult ECG Report  Rate: 67 ;  Rhythm: normal sinus rhythm and , LAE, Inc RBBB,Non-specific ST-T changes ;   Narrative Interpretation: stable   Recent Labs:  [facility] 11/12/2020: TC 162, TG 124, HDL 56, LDL down to 85 from 113.  Hgb 12.2, Cr 1.12, K+ 4.5. Lab Results  Component Value Date   CHOL 185 02/16/2018   HDL 62 02/16/2018   LDLCALC 113 (H) 02/16/2018  TRIG 48 02/16/2018   CHOLHDL 3.0 02/16/2018   Lab Results  Component Value Date   CREATININE 1.14 (H) 02/13/2019   BUN 17 02/13/2019   NA 138 02/14/2019   K 4.3 02/14/2019   CL 103 02/13/2019   CO2 24 02/13/2019   CBC Latest Ref Rng & Units 02/14/2019 02/14/2019 02/13/2019  WBC 4.0 - 10.5 K/uL - 4.5 8.0  Hemoglobin 12.0 - 15.0 g/dL 10.5(L) 10.7(L) 11.0(L)  Hematocrit 36.0 - 46.0 % 31.0(L) 30.2(L) 31.2(L)  Platelets 150 - 400 K/uL - 191 222    Lab Results  Component Value Date   HGBA1C 5.8 (H) 02/16/2018   Lab Results  Component Value Date   TSH 2.427 02/16/2018    ==================================================  COVID-19 Education: The signs and symptoms of COVID-19 were discussed with the patient and how to seek care for testing (follow up with PCP or arrange E-visit).    I spent a total of ***minutes with the patient spent in direct patient consultation.  Additional time spent with chart review  / charting (studies, outside notes, etc): *** min Total Time: *** min  Current medicines are reviewed at length with the patient today.  (+/- concerns) ***  This visit occurred during the SARS-CoV-2 public health emergency.  Safety protocols were in place, including screening questions prior to the visit, additional usage of staff PPE, and extensive cleaning of exam room while observing appropriate contact time as indicated for disinfecting solutions.  Notice: This dictation was prepared with Dragon dictation along with smart phrase technology. Any transcriptional errors that result from this process are unintentional and may not be corrected upon review.  Studies Ordered:   No orders of the defined types were placed in this encounter.   Patient Instructions / Medication Changes & Studies & Tests Ordered   There are no Patient Instructions on file for this visit.     Glenetta Hew, M.D., M.S. Interventional Cardiologist   Pager # [phone] Phone #  [phone] [address] [place], [place] [address]   Thank you for choosing [facility]!!

## 2021-03-30 DIAGNOSIS — N6459 Other signs and symptoms in breast: Secondary | ICD-10-CM | POA: Diagnosis not present

## 2021-03-30 DIAGNOSIS — N6315 Unspecified lump in the right breast, overlapping quadrants: Secondary | ICD-10-CM | POA: Diagnosis not present

## 2021-03-30 DIAGNOSIS — R928 Other abnormal and inconclusive findings on diagnostic imaging of breast: Secondary | ICD-10-CM | POA: Diagnosis not present

## 2021-04-04 ENCOUNTER — Encounter (HOSPITAL_COMMUNITY): Payer: Self-pay | Admitting: Internal Medicine

## 2021-04-04 ENCOUNTER — Observation Stay (HOSPITAL_COMMUNITY)
Admission: EM | Admit: 2021-04-04 | Discharge: 2021-04-05 | Disposition: A | Discharge status: Home / Self Care | Payer: Medicare PPO | Attending: Internal Medicine | Admitting: Internal Medicine

## 2021-04-04 ENCOUNTER — Emergency Department (HOSPITAL_COMMUNITY): Payer: Medicare PPO

## 2021-04-04 ENCOUNTER — Other Ambulatory Visit: Payer: Self-pay

## 2021-04-04 DIAGNOSIS — Z8673 Personal history of transient ischemic attack (TIA), and cerebral infarction without residual deficits: Secondary | ICD-10-CM | POA: Insufficient documentation

## 2021-04-04 DIAGNOSIS — N1831 Chronic kidney disease, stage 3a: Secondary | ICD-10-CM | POA: Insufficient documentation

## 2021-04-04 DIAGNOSIS — I129 Hypertensive chronic kidney disease with stage 1 through stage 4 chronic kidney disease, or unspecified chronic kidney disease: Secondary | ICD-10-CM | POA: Insufficient documentation

## 2021-04-04 DIAGNOSIS — Z79899 Other long term (current) drug therapy: Secondary | ICD-10-CM | POA: Diagnosis not present

## 2021-04-04 DIAGNOSIS — Z8639 Personal history of other endocrine, nutritional and metabolic disease: Secondary | ICD-10-CM | POA: Diagnosis not present

## 2021-04-04 DIAGNOSIS — G459 Transient cerebral ischemic attack, unspecified: Secondary | ICD-10-CM | POA: Diagnosis not present

## 2021-04-04 DIAGNOSIS — R4702 Dysphasia: Secondary | ICD-10-CM | POA: Diagnosis not present

## 2021-04-04 DIAGNOSIS — R2681 Unsteadiness on feet: Secondary | ICD-10-CM | POA: Diagnosis not present

## 2021-04-04 DIAGNOSIS — N179 Acute kidney failure, unspecified: Secondary | ICD-10-CM | POA: Insufficient documentation

## 2021-04-04 DIAGNOSIS — R4701 Aphasia: Secondary | ICD-10-CM | POA: Diagnosis present

## 2021-04-04 DIAGNOSIS — R457 State of emotional shock and stress, unspecified: Secondary | ICD-10-CM | POA: Diagnosis not present

## 2021-04-04 DIAGNOSIS — I959 Hypotension, unspecified: Secondary | ICD-10-CM | POA: Diagnosis not present

## 2021-04-04 DIAGNOSIS — R634 Abnormal weight loss: Secondary | ICD-10-CM

## 2021-04-04 DIAGNOSIS — Z86718 Personal history of other venous thrombosis and embolism: Secondary | ICD-10-CM | POA: Insufficient documentation

## 2021-04-04 DIAGNOSIS — R29818 Other symptoms and signs involving the nervous system: Secondary | ICD-10-CM | POA: Diagnosis not present

## 2021-04-04 DIAGNOSIS — I10 Essential (primary) hypertension: Secondary | ICD-10-CM | POA: Diagnosis present

## 2021-04-04 HISTORY — DX: Acute embolism and thrombosis of left peroneal vein: I82.452

## 2021-04-04 LAB — CBC
HCT: 30.9 % — ABNORMAL LOW (ref 36.0–46.0)
Hemoglobin: 11.4 g/dL — ABNORMAL LOW (ref 12.0–15.0)
MCH: 30.9 pg (ref 26.0–34.0)
MCHC: 36.9 g/dL — ABNORMAL HIGH (ref 30.0–36.0)
MCV: 83.7 fL (ref 80.0–100.0)
Platelets: 191 10*3/uL (ref 150–400)
RBC: 3.69 MIL/uL — ABNORMAL LOW (ref 3.87–5.11)
RDW: 13.4 % (ref 11.5–15.5)
WBC: 7.9 10*3/uL (ref 4.0–10.5)
nRBC: 0 % (ref 0.0–0.2)

## 2021-04-04 LAB — DIFFERENTIAL
Abs Immature Granulocytes: 0.02 10*3/uL (ref 0.00–0.07)
Basophils Absolute: 0 10*3/uL (ref 0.0–0.1)
Basophils Relative: 0 %
Eosinophils Absolute: 0.1 10*3/uL (ref 0.0–0.5)
Eosinophils Relative: 1 %
Immature Granulocytes: 0 %
Lymphocytes Relative: 48 %
Lymphs Abs: 3.8 10*3/uL (ref 0.7–4.0)
Monocytes Absolute: 0.7 10*3/uL (ref 0.1–1.0)
Monocytes Relative: 9 %
Neutro Abs: 3.3 10*3/uL (ref 1.7–7.7)
Neutrophils Relative %: 42 %

## 2021-04-04 LAB — COMPREHENSIVE METABOLIC PANEL
ALT: 15 U/L (ref 0–44)
AST: 23 U/L (ref 15–41)
Albumin: 3.7 g/dL (ref 3.5–5.0)
Alkaline Phosphatase: 43 U/L (ref 38–126)
Anion gap: 11 (ref 5–15)
BUN: 21 mg/dL (ref 8–23)
CO2: 22 mmol/L (ref 22–32)
Calcium: 9.4 mg/dL (ref 8.9–10.3)
Chloride: 102 mmol/L (ref 98–111)
Creatinine, Ser: 1.64 mg/dL — ABNORMAL HIGH (ref 0.44–1.00)
GFR, Estimated: 29 mL/min — ABNORMAL LOW (ref >60)
Glucose, Bld: 119 mg/dL — ABNORMAL HIGH (ref 70–99)
Potassium: 4.7 mmol/L (ref 3.5–5.1)
Sodium: 135 mmol/L (ref 135–145)
Total Bilirubin: 0.6 mg/dL (ref 0.3–1.2)
Total Protein: 6.6 g/dL (ref 6.5–8.1)

## 2021-04-04 LAB — I-STAT CHEM 8, ED
BUN: 24 mg/dL — ABNORMAL HIGH (ref 8–23)
Calcium, Ion: 1.13 mmol/L — ABNORMAL LOW (ref 1.15–1.40)
Chloride: 103 mmol/L (ref 98–111)
Creatinine, Ser: 1.8 mg/dL — ABNORMAL HIGH (ref 0.44–1.00)
Glucose, Bld: 113 mg/dL — ABNORMAL HIGH (ref 70–99)
HCT: 33 % — ABNORMAL LOW (ref 36.0–46.0)
Hemoglobin: 11.2 g/dL — ABNORMAL LOW (ref 12.0–15.0)
Potassium: 4.7 mmol/L (ref 3.5–5.1)
Sodium: 137 mmol/L (ref 135–145)
TCO2: 23 mmol/L (ref 22–32)

## 2021-04-04 LAB — PROTIME-INR
INR: 1.2 (ref 0.8–1.2)
Prothrombin Time: 14.9 seconds (ref 11.4–15.2)

## 2021-04-04 LAB — APTT: aPTT: 26 seconds (ref 24–36)

## 2021-04-04 LAB — CBG MONITORING, ED: Glucose-Capillary: 109 mg/dL — ABNORMAL HIGH (ref 70–99)

## 2021-04-04 MED ORDER — CLOPIDOGREL BISULFATE 75 MG PO TABS
75.0000 mg | ORAL_TABLET | Freq: Every day | ORAL | Status: DC
  Administered 2021-04-05: 75 mg via ORAL
  Filled 2021-04-04: qty 1

## 2021-04-04 MED ORDER — SODIUM CHLORIDE 0.9% FLUSH: 3.0000 mL | Freq: Once | INTRAVENOUS | Status: DC

## 2021-04-04 MED ORDER — CLOPIDOGREL BISULFATE 300 MG PO TABS
300.0000 mg | ORAL_TABLET | Freq: Once | ORAL | Status: AC
Start: 2021-04-04 — End: 2021-04-04
  Administered 2021-04-04: 300 mg via ORAL
  Filled 2021-04-04: qty 1

## 2021-04-04 MED ORDER — LACTATED RINGERS IV SOLN: INTRAVENOUS | Status: DC

## 2021-04-04 MED ORDER — IOHEXOL 350 MG/ML SOLN
100.0000 mL | Freq: Once | INTRAVENOUS | Status: AC | PRN
Start: 2021-04-04 — End: 2021-04-04
  Administered 2021-04-04: 100 mL via INTRAVENOUS

## 2021-04-04 NOTE — ED Provider Notes (Signed)
?[facility] ?Provider Note ? ? ?CSN: [id number] ?Arrival date & time: 04/04/21  1811 ? ?  ? ?History ? ?Chief Complaint  ?Patient presents with  ? Aphasia  ? ? ?[name] is a 88 y.o. female. ? ?88 year old female presents with acute onset of aphasia was last for several hours.  Had associated diffuse weakness throughout her body.  Some headache but no facial droop.  Symptoms resolved spontaneously.  No prior history of same.  No recent medications.  Did note a drop in her blood pressure yesterday but denies any changes to her medications.  Patient called a code stroke ? ? ?  ? ?Home Medications ?Prior to Admission medications   ?Medication Sig Start Date End Date Taking? Authorizing Provider  ?acetaminophen (TYLENOL) 500 MG tablet Take 500-1,000 mg by mouth every 8 (eight) hours as needed for mild pain.     [provider]  ?albuterol (VENTOLIN HFA) 108 (90 Base) MCG/ACT inhaler Inhale 2 puffs into the lungs every 4 (four) hours as needed for wheezing or shortness of breath.  01/30/19   [provider]  ?ascorbic acid (VITAMIN C) 500 MG tablet Take 500-1,000 mg by mouth daily with breakfast.    [provider]  ?Cholecalciferol (VITAMIN D-3) 25 MCG (1000 UT) CAPS Take 1,000 Units by mouth daily with breakfast.     [provider]  ?ezetimibe (ZETIA) 10 MG tablet ezetimibe 10 mg tablet 02/05/20   [provider]  ?Lactobacillus Rhamnosus, GG, (CULTURELLE) CAPS Take 1 capsule by mouth every morning.     [provider]  ?Magnesium 250 MG TABS Take 250 mg by mouth daily as needed (leg cramps).    [provider]  ?metoprolol tartrate (LOPRESSOR) 25 MG tablet Take 0.5 tablets by mouth 2 (two) times daily. 05/18/19   [provider]  ?Multiple Vitamins-Minerals (AIRBORNE PO) Take 1 tablet by mouth daily with breakfast.    [provider]  ?Multiple Vitamins-Minerals (CENTRUM SILVER ULTRA WOMENS) TABS  Take 1 tablet by mouth daily with breakfast.     [provider]  ?pravastatin (PRAVACHOL) 20 MG tablet Take 20 mg by mouth daily.     [provider]  ?Saccharomyces boulardii (PROBIOTIC) 250 MG CAPS Take by mouth daily. Pt isn't sure of the dosage    [provider]  ?   ? ?Allergies    ?Atorvastatin, Desmopressin acetate, Iodinated contrast media, Myrbetriq [mirabegron], and Iodine   ? ?Review of Systems   ?Review of Systems  ?All other systems reviewed and are negative. ? ?Physical Exam ?Updated Vital Signs ?BP 131/60 (BP Location: Right Arm)   Pulse 65   Temp 97.7 ?F (36.5 ?C) (Oral)   Resp 18   Ht 1.6 m ('5\' 3"'$ )   Wt 59 kg   SpO2 99%   BMI 23.04 kg/m?  ?Physical Exam ?Vitals and nursing note reviewed.  ?Constitutional:   ?   General: She is not in acute distress. ?   Appearance: Normal appearance. She is well-developed. She is not toxic-appearing.  ?HENT:  ?   Head: Normocephalic and atraumatic.  ?Eyes:  ?   General: Lids are normal.  ?   Conjunctiva/sclera: Conjunctivae normal.  ?   Pupils: Pupils are equal, round, and reactive to light.  ?Neck:  ?   Thyroid: No thyroid mass.  ?   Trachea: No tracheal deviation.  ?Cardiovascular:  ?   Rate and Rhythm: Normal rate and regular rhythm.  ?  Heart sounds: Normal heart sounds. No murmur heard. ?  No gallop.  ?Pulmonary:  ?   Effort: Pulmonary effort is normal. No respiratory distress.  ?   Breath sounds: Normal breath sounds. No stridor. No decreased breath sounds, wheezing, rhonchi or rales.  ?Abdominal:  ?   General: There is no distension.  ?   Palpations: Abdomen is soft.  ?   Tenderness: There is no abdominal tenderness. There is no rebound.  ?Musculoskeletal:     ?   General: No tenderness. Normal range of motion.  ?   Cervical back: Normal range of motion and neck supple.  ?Skin: ?   General: Skin is warm and dry.  ?   Findings: No abrasion or rash.  ?Neurological:  ?   General: No focal deficit present.  ?   Mental Status:  She is alert and oriented to person, place, and time. Mental status is at baseline.  ?   GCS: GCS eye subscore is 4. GCS verbal subscore is 5. GCS motor subscore is 6.  ?   Cranial Nerves: No cranial nerve deficit.  ?   Sensory: No sensory deficit.  ?   Motor: Motor function is intact.  ?Psychiatric:     ?   Attention and Perception: Attention normal.     ?   Speech: Speech normal.     ?   Behavior: Behavior normal.  ? ? ?ED Results / Procedures / Treatments   ?Labs ?(all labs ordered are listed, but only abnormal results are displayed) ?Labs Reviewed  ?CBC - Abnormal; Notable for the following components:  ?    Result Value  ? RBC 3.69 (*)   ? Hemoglobin 11.4 (*)   ? HCT 30.9 (*)   ? MCHC 36.9 (*)   ? All other components within normal limits  ?COMPREHENSIVE METABOLIC PANEL - Abnormal; Notable for the following components:  ? Glucose, Bld 119 (*)   ? Creatinine, Ser 1.64 (*)   ? GFR, Estimated 29 (*)   ? All other components within normal limits  ?I-STAT CHEM 8, ED - Abnormal; Notable for the following components:  ? BUN 24 (*)   ? Creatinine, Ser 1.80 (*)   ? Glucose, Bld 113 (*)   ? Calcium, Ion 1.13 (*)   ? Hemoglobin 11.2 (*)   ? HCT 33.0 (*)   ? All other components within normal limits  ?CBG MONITORING, ED - Abnormal; Notable for the following components:  ? Glucose-Capillary 109 (*)   ? All other components within normal limits  ?PROTIME-INR  ?APTT  ?DIFFERENTIAL  ? ? ?EKG ?EKG Interpretation ? ?Date/Time:  Sunday April 04 2021 18:32:18 EDT ?Ventricular Rate:  65 ?PR Interval:  164 ?QRS Duration: 121 ?QT Interval:  439 ?QTC Calculation: 457 ?R Axis:   -4 ?Text Interpretation: Sinus rhythm Probable left atrial enlargement Right bundle branch block Confirmed by Lacretia Leigh ([id number]) on 04/04/2021 6:39:36 PM ? ?Radiology ?CT HEAD CODE STROKE WO CONTRAST ? ?Result Date: 04/04/2021 ?CLINICAL DATA:  Code stroke.  Neuro deficit, acute, stroke suspected EXAM: CT HEAD WITHOUT CONTRAST TECHNIQUE: Contiguous axial  images were obtained from the base of the skull through the vertex without intravenous contrast. RADIATION DOSE REDUCTION: This exam was performed according to the departmental dose-optimization program which includes automated exposure control, adjustment of the mA and/or kV according to patient size and/or use of iterative reconstruction technique. COMPARISON:  CT 01/08/2020. FINDINGS: Brain: No evidence of acute large vascular territory infarction, hemorrhage, hydrocephalus,  extra-axial collection or mass lesion/mass effect. Similar chronic microvascular ischemic disease and atrophy. Vascular: No hyperdense vessel identified. Skull: No acute fracture. Sinuses/Orbits: Mild paranasal sinus mucosal thickening. No acute orbital findings. Other: No mastoid effusions. ASPECTS [facility] Stroke Program Early CT Score) total score (0-10 with 10 being normal): 10. IMPRESSION: 1. No evidence of acute intracranial abnormality. 2. ASPECTS is 10. Code stroke imaging results were communicated on 04/04/2021 at 6:23 pm to provider Dr. Leonel Ramsay via secure text paging. Electronically Signed   By: Margaretha Sheffield M.D.   On: 04/04/2021 18:24   ? ?Procedures ?Procedures  ? ? ?Medications Ordered in ED ?Medications  ?sodium chloride flush (NS) 0.9 % injection 3 mL (has no administration in time range)  ?iohexol (OMNIPAQUE) 350 MG/ML injection 100 mL (100 mLs Intravenous Contrast Given 04/04/21 1826)  ? ? ?ED Course/ Medical Decision Making/ A&P ?  ?                        ?Medical Decision Making ?Amount and/or Complexity of Data Reviewed ?Labs: ordered. ?Radiology: ordered. ? ? ?Patient's EKG shows no signs of acute ischemic changes per my interpretation.  Patient seen emergently by neurology on arrival here.  Patient's imaging shows no acute findings requiring emergent intervention at this time per my interpretation and per review with neurology.  Likely experienced a TIA as she is completely intact at this time.  Per  recommendation of neurology, she will be admitted for TIA work-up.  This was discussed with the patient and her son and they are agreeable ? ? ? ? ? ? ? ?Final Clinical Impression(s) / ED Diagnoses ?Final diagnoses:  ?None  ? ?

## 2021-04-04 NOTE — ED Triage Notes (Signed)
Pt BIB [facility] for new onset dysphasia. LKW 1715, pt with hx TIA. Dysphasia resolved en route. No weakness or deficits during episode. ?

## 2021-04-04 NOTE — Consult Note (Signed)
Neurology Consultation ?Reason for Consult: Aphasia ?Referring Physician: Zenia Resides, a ? ?CC: Aphasia ? ?History is obtained from: Patient, EMS, son ? ?HPI: [name] is a 88 y.o. female with a history of previous TIA who presents with transient aphasia.  She was sitting down to dinner after having made it, when she had some difficulty expressing herself.  She states that she just could not get the words out, and may have had some difficulty understanding as well.  Due to this, EMS was called and en route she began to speak and by the time of arrival to the emergency department was speaking rather fluently. ? ? ?LKW: 5:15 PM ?tpa given?: no, resolution of symptoms ? ? ? ?Past Medical History:  ?Diagnosis Date  ? Abnormal Pap smear 1975-76  ? Acute deep vein thrombosis (DVT) of left peroneal vein (HCC)   ? Anemia   ? history of  ? Arthritis   ? spine  ? Breast cyst, left 1980  ? Cystocele 2012  ? Diverticulosis   ? with h/o Diverticulitis  ? DVT, lower extremity, distal, chronic ([name]) 12/24/2018  ? GERD (gastroesophageal reflux disease)   ? H/O hypercholesterolemia   ? H/O osteopenia   ? H/O varicella   ? H/O varicose veins   ? Hx of Mumps   ? Macular degeneration   ? Seasonal allergies   ? Urge incontinence 2012  ? Urinary frequency 2010  ? ? ? ?Family History  ?Problem Relation Age of Onset  ? Uterine cancer Mother 60  ? Heart attack Brother 61  ? Breast cancer Daughter   ? ? ? ?Social History:  reports that she has never smoked. She has never used smokeless tobacco. She reports that she does not drink alcohol and does not use drugs. ? ? ?Exam: ?Current vital signs: ?BP (!) 122/53   Pulse 63   Temp 97.7 ?F (36.5 ?C) (Oral)   Resp 13   Ht '5\' 3"'$  (1.6 m)   Wt 59 kg   SpO2 99%   BMI 23.04 kg/m?  ?Vital signs in last 24 hours: ?Temp:  [97.7 ?F (36.5 ?C)] 97.7 ?F (36.5 ?C) (03/19 1835) ?Pulse Rate:  [63-65] 63 (03/19 1930) ?Resp:  [13-21] 13 (03/19 1930) ?BP: (122-131)/(53-60) 122/53 (03/19 1930) ?SpO2:  [98 %-100  %] 99 % (03/19 1930) ?Weight:  [59 kg] 59 kg (03/19 1838) ? ? ?Physical Exam  ?Constitutional: Appears well-developed and well-nourished.  ?Psych: Affect appropriate to situation ?Eyes: No scleral injection ?HENT: No OP obstruction ?MSK: no joint deformities.  ?Cardiovascular: Normal rate and regular rhythm.  ?Respiratory: Effort normal, non-labored breathing ?GI: Soft.  No distension. There is no tenderness.  ?Skin: WDI ? ?Neuro: ?Mental Status: ?Patient is awake, alert, oriented to person, place, month, year, and situation. ?Patient is able to give a clear and coherent history. ?No signs of aphasia or neglect ?Cranial Nerves: ?II: Visual Fields are full. Pupils are equal, round, and reactive to light.   ?III,IV, VI: EOMI without ptosis or diploplia.  ?V: Facial sensation is symmetric to temperature ?VII: Facial movement is symmetric.  ?VIII: hearing is intact to voice ?X: Uvula elevates symmetrically ?XI: Shoulder shrug is symmetric. ?XII: tongue is midline without atrophy or fasciculations.  ?Motor: ?Tone is normal. Bulk is normal. 5/5 strength was present in all four extremities.  ?Sensory: ?Sensation is symmetric to light touch and temperature in the arms and legs. ?Cerebellar: ?FNF and HKS are intact bilaterally ? ? ? ?I have reviewed labs in  epic and the results pertinent to this consultation are: ?Creatinine 1.64 ? ?I have reviewed the images obtained: CT head-negative ?CTA-negative ? ?Impression: 88 year old female with acute onset aphasia with resolution of symptoms.  She will need to be admitted for TIA work-up. ? ?Recommendations: ?- HgbA1c, fasting lipid panel ?- MRI of the brain without contrast ?- Frequent neuro checks ?- Echocardiogram ?- Prophylactic therapy-Antiplatelet med: Aspirin - dose '81mg'$  and plavix '75mg'$  daily  after '300mg'$  load  ?- Risk factor modification ?- Telemetry monitoring ?- PT consult, OT consult, Speech consult ?- Stroke team to follow ? ? ? ?Roland Rack, MD ?[facility] ?[phone] ? ?If 7pm- 7am, please page neurology on call as listed in [facility]. ? ?

## 2021-04-04 NOTE — Assessment & Plan Note (Addendum)
Blood pressure elevated at this time.  Patient takes a low-dose of metoprolol at home. ?

## 2021-04-04 NOTE — Assessment & Plan Note (Addendum)
Patient received gentle IV fluids.  Creatinine was mildly elevated on presentation but.  Creatinine down to 1.3 after IV fluids.  Encourage oral hydration.  Patient was advised to follow-up with primary care physician as outpatient. ?

## 2021-04-04 NOTE — Assessment & Plan Note (Addendum)
Continue Zetia and Pravachol.  Lipid panel showed LDL of 87. ?

## 2021-04-04 NOTE — Assessment & Plan Note (Addendum)
Baseline creatinine around 1.0.  Had mild AKI.  Creatinine improved to 1.3 after IV fluids.  Encourage oral hydration on discharge. ?

## 2021-04-04 NOTE — Subjective & Objective (Signed)
CC: aphasia/dysarthria ?HPI: ?88 year old African-American female with a history of hypertension, prior history of DVT no longer on anticoagulation, history of prior TIA, hyperlipidemia, CKD stage IIIa baseline creatinine approximate 1.0 presents the ER today with a sudden onset of aphasia/dysarthria starting around 5 to 5:15 PM.  Patient states that she was eating dinner with her family.  She was unable to speak.  She noticed that she was feeling on.  She is states that she felt something weird in her face.  Son is at the bedside.  He states that patient was unable to get anything out.  He knew that the patient was trying to say something but patient could not speak.  EMS was called. ? ?Patient states that her blood pressure was very low yesterday.  She is on Lopressor.  She has lost about 25 pounds last year.  She states that her systolic blood pressure was about 95 yesterday. ? ? ?On arrival to the ER, temp 97.7 heart rate 65 blood pressure 131/60, satting 100% on room air. ? ?After she arrived to the ER, patient's dysarthria/aphasia had resolved. ? ?CTA head neck and CT  of the head were negative for acute stroke and negative for LVO. ? ?EDP has consulted neurology. ? ?Patient to be admitted by hospitalist for TIA work-up. ?

## 2021-04-04 NOTE — Assessment & Plan Note (Addendum)
CT head without acute findings.  She was seen by physical therapy who recommended outpatient PT on discharge.  Hemoglobin A1c was 5.7. Lipid Profile noted with LDL cholesterol of 87. Continue Zetia and pravastatin on discharge.   2D echocardiogram showed LV ejection fraction of 60 to 65% with no regional wall motion abnormalities with grade 2 diastolic dysfunction.  MRI of the brain without any acute findings EEG was negative for any seizures. ?

## 2021-04-04 NOTE — Assessment & Plan Note (Addendum)
Has completed course of Eliquis in the past.  DVT was thought to be secondary to COVID-19 infection in the past.  No need for further anticoagulation. ?

## 2021-04-04 NOTE — Assessment & Plan Note (Addendum)
25 pound weight loss in 1 year.  Will need outpatient follow-up. ?

## 2021-04-04 NOTE — H&P (Signed)
?History and Physical  ? ? ?See [name] YSA:[id number] DOB: [birth year] DOA: 04/04/2021 ? ?DOS: the patient was seen and examined on 04/04/2021 ? ?PCP: Lavone Orn, MD  ? ?Patient coming from: Home ? ?I have personally briefly reviewed patient's old medical records in [place] ? ?CC: aphasia/dysarthria ?HPI: ?88 year old African-American female with a history of hypertension, prior history of DVT no longer on anticoagulation, history of prior TIA, hyperlipidemia, CKD stage IIIa baseline creatinine approximate 1.0 presents the ER today with a sudden onset of aphasia/dysarthria starting around 5 to 5:15 PM.  Patient states that she was eating dinner with her family.  She was unable to speak.  She noticed that she was feeling on.  She is states that she felt something weird in her face.  Son is at the bedside.  He states that patient was unable to get anything out.  He knew that the patient was trying to say something but patient could not speak.  EMS was called. ? ?Patient states that her blood pressure was very low yesterday.  She is on Lopressor.  She has lost about 25 pounds last year.  She states that her systolic blood pressure was about 95 yesterday. ? ? ?On arrival to the ER, temp 97.7 heart rate 65 blood pressure 131/60, satting 100% on room air. ? ?After she arrived to the ER, patient's dysarthria/aphasia had resolved. ? ?CTA head neck and CT  of the head were negative for acute stroke and negative for LVO. ? ?EDP has consulted neurology. ? ?Patient to be admitted by hospitalist for TIA work-up.  ? ?ED Course: CT head negative for hemorraghic stroke. CTA head and neck negative for LVO. Labs show mild AKI ? ?Review of Systems:  ?Review of Systems  ?Constitutional:  Positive for malaise/fatigue.  ?     Admits to low BP yesterday ?Admits to 25 lbs unintentional weight loss in the last year.  ?HENT: Negative.    ?Eyes: Negative.   ?Respiratory: Negative.    ?Cardiovascular: Negative.   ?Gastrointestinal:  Negative.   ?Genitourinary: Negative.   ?Musculoskeletal: Negative.   ?Skin: Negative.   ?Neurological:  Positive for weakness.  ?     Aphasia  ?Endo/Heme/Allergies: Negative.   ?Psychiatric/Behavioral: Negative.    ?All other systems reviewed and are negative. ? ?Past Medical History:  ?Diagnosis Date  ? Abnormal Pap smear 1975-76  ? Acute deep vein thrombosis (DVT) of left peroneal vein (HCC)   ? Anemia   ? history of  ? Arthritis   ? spine  ? Breast cyst, left 1980  ? Cystocele 2012  ? Diverticulosis   ? with h/o Diverticulitis  ? DVT, lower extremity, distal, chronic ([facility]) 12/24/2018  ? GERD (gastroesophageal reflux disease)   ? H/O hypercholesterolemia   ? H/O osteopenia   ? H/O varicella   ? H/O varicose veins   ? Hx of Mumps   ? Macular degeneration   ? Seasonal allergies   ? Urge incontinence 2012  ? Urinary frequency 2010  ? ? ?Past Surgical History:  ?Procedure Laterality Date  ? ABDOMINAL HYSTERECTOMY    ? BLADDER SUSPENSION N/A 06/16/2016  ? Procedure: TRANSVAGINAL TAPE (TVT) PROCEDURE;  Surgeon: Everett Graff, MD;  Location: [facility];  Service: Gynecology;  Laterality: N/A;  ? BREAST CYST ASPIRATION  1964  ? COLONOSCOPY    ? Dr. Earlean Shawl  ? CYSTOCELE REPAIR N/A 06/16/2016  ? Procedure: ANTERIOR REPAIR (CYSTOCELE);  Surgeon: Everett Graff, MD;  Location: [facility];  Service: Gynecology;  Laterality: N/A;  ? CYSTOSCOPY N/A 06/16/2016  ? Procedure: CYSTOSCOPY;  Surgeon: Everett Graff, MD;  Location: [facility];  Service: Gynecology;  Laterality: N/A;  see anterior repair  ? Lower Extremity Venous Dopplers  01/09/2012  ? Right and left lower steroids: No evidence of thrombus or, thrombophlebitis; right and left GSV and SSV: No venous insufficiency. Normal exam.  ? NM MYOVIEW LTD  11/2016  ? 6.6 METS.  Reached 106% of max.  Heart rate.  Walk for 4:40 min.  EF 72%.  Normal blood pressure response.  upsloping ST segment depression, nonspecific.  Otherwise normal study.  LOW RISK.  No evidence of ischemia or infarction.   ? TRANSTHORACIC ECHOCARDIOGRAM  01/2018  ? EF > 65%. LV size small. Gr 1 DD (normal for age).   Normal atrial size. Moderate Aortic Sclerosis without Stenosis. Moderate Mitral Annular Calcification with mild MR & no MS  ? ? ? reports that she has never smoked. She has never used smokeless tobacco. She reports that she does not drink alcohol and does not use drugs. ? ?Allergies  ?Allergen Reactions  ? Atorvastatin Other (See Comments)  ? Desmopressin Acetate Other (See Comments)  ? Iodinated Contrast Media Hives and Other (See Comments)  ? Myrbetriq [Mirabegron] Other (See Comments)  ?  "Makes me urinate more frequently"  ? Iodine Rash  ? ? ?Family History  ?Problem Relation Age of Onset  ? Uterine cancer Mother 22  ? Heart attack Brother 30  ? Breast cancer Daughter   ? ? ?Prior to Admission medications   ?Medication Sig Start Date End Date Taking? Authorizing Provider  ?acetaminophen (TYLENOL) 500 MG tablet Take 500-1,000 mg by mouth every 8 (eight) hours as needed for mild pain.     [provider]  ?albuterol (VENTOLIN HFA) 108 (90 Base) MCG/ACT inhaler Inhale 2 puffs into the lungs every 4 (four) hours as needed for wheezing or shortness of breath.  01/30/19   [provider]  ?ascorbic acid (VITAMIN C) 500 MG tablet Take 500-1,000 mg by mouth daily with breakfast.    [provider]  ?Cholecalciferol (VITAMIN D-3) 25 MCG (1000 UT) CAPS Take 1,000 Units by mouth daily with breakfast.     [provider]  ?ezetimibe (ZETIA) 10 MG tablet ezetimibe 10 mg tablet 02/05/20   [provider]  ?Lactobacillus Rhamnosus, GG, (CULTURELLE) CAPS Take 1 capsule by mouth every morning.     [provider]  ?Magnesium 250 MG TABS Take 250 mg by mouth daily as needed (leg cramps).    [provider]  ?metoprolol tartrate (LOPRESSOR) 25 MG tablet Take 0.5 tablets by mouth 2 (two) times daily. 05/18/19   [provider]  ?Multiple Vitamins-Minerals (AIRBORNE PO)  Take 1 tablet by mouth daily with breakfast.    [provider]  ?Multiple Vitamins-Minerals (CENTRUM SILVER ULTRA WOMENS) TABS Take 1 tablet by mouth daily with breakfast.     [provider]  ?pravastatin (PRAVACHOL) 20 MG tablet Take 20 mg by mouth daily.     [provider]  ?Saccharomyces boulardii (PROBIOTIC) 250 MG CAPS Take by mouth daily. Pt isn't sure of the dosage    [provider]  ? ? ?Physical Exam: ?Vitals:  ? 04/04/21 1835 04/04/21 1838 04/04/21 1900 04/04/21 1930  ?BP: 131/60  (!) 122/53 (!) 122/53  ?Pulse: 65  64 63  ?Resp: 18  (!) 21 13  ?Temp: 97.7 ?F (36.5 ?C)     ?TempSrc:  Oral     ?SpO2: 100% 99% 98% 99%  ?Weight:  59 kg    ?Height:  '5\' 3"'$  (1.6 m)    ? ? ?Physical Exam ?Vitals and nursing note reviewed.  ?Constitutional:   ?   General: She is not in acute distress. ?   Appearance: Normal appearance. She is normal weight. She is not ill-appearing, toxic-appearing or diaphoretic.  ?HENT:  ?   Head: Normocephalic and atraumatic.  ?   Nose: Nose normal. No rhinorrhea.  ?Eyes:  ?   General:     ?   Right eye: No discharge.     ?   Left eye: No discharge.  ?Cardiovascular:  ?   Rate and Rhythm: Normal rate and regular rhythm.  ?   Pulses: Normal pulses.  ?   Heart sounds: Murmur heard.  ?Pulmonary:  ?   Effort: Pulmonary effort is normal. No respiratory distress.  ?   Breath sounds: Normal breath sounds. No wheezing or rales.  ?Abdominal:  ?   General: Abdomen is flat. Bowel sounds are normal. There is no distension.  ?   Palpations: Abdomen is soft.  ?   Tenderness: There is no abdominal tenderness. There is no guarding or rebound.  ?Musculoskeletal:  ?   Right lower leg: No edema.  ?   Left lower leg: No edema.  ?Skin: ?   General: Skin is warm and dry.  ?   Capillary Refill: Capillary refill takes less than 2 seconds.  ?Neurological:  ?   General: No focal deficit present.  ?   Mental Status: She is alert and oriented to person, place, and time.  ?   Cranial  Nerves: Cranial nerves 2-12 are intact.  ?   Motor: No weakness.  ?   Coordination: Finger-Nose-Finger Test normal. Rapid alternating movements normal.  ?  ? ?Labs on Admission: I have personally revi

## 2021-04-05 ENCOUNTER — Observation Stay (HOSPITAL_COMMUNITY): Payer: Medicare PPO

## 2021-04-05 ENCOUNTER — Encounter (INDEPENDENT_AMBULATORY_CARE_PROVIDER_SITE_OTHER): Payer: Medicare PPO | Admitting: Ophthalmology

## 2021-04-05 ENCOUNTER — Observation Stay (HOSPITAL_BASED_OUTPATIENT_CLINIC_OR_DEPARTMENT_OTHER): Payer: Medicare PPO

## 2021-04-05 DIAGNOSIS — G459 Transient cerebral ischemic attack, unspecified: Secondary | ICD-10-CM | POA: Diagnosis not present

## 2021-04-05 DIAGNOSIS — N179 Acute kidney failure, unspecified: Secondary | ICD-10-CM

## 2021-04-05 DIAGNOSIS — R471 Dysarthria and anarthria: Secondary | ICD-10-CM | POA: Diagnosis not present

## 2021-04-05 DIAGNOSIS — R479 Unspecified speech disturbances: Secondary | ICD-10-CM

## 2021-04-05 DIAGNOSIS — R4701 Aphasia: Secondary | ICD-10-CM | POA: Diagnosis not present

## 2021-04-05 DIAGNOSIS — I611 Nontraumatic intracerebral hemorrhage in hemisphere, cortical: Secondary | ICD-10-CM | POA: Diagnosis not present

## 2021-04-05 LAB — HEMOGLOBIN A1C
Hgb A1c MFr Bld: 5.7 % — ABNORMAL HIGH (ref 4.8–5.6)
Mean Plasma Glucose: 116.89 mg/dL

## 2021-04-05 LAB — COMPREHENSIVE METABOLIC PANEL
ALT: 12 U/L (ref 0–44)
AST: 21 U/L (ref 15–41)
Albumin: 3.2 g/dL — ABNORMAL LOW (ref 3.5–5.0)
Alkaline Phosphatase: 37 U/L — ABNORMAL LOW (ref 38–126)
Anion gap: 9 (ref 5–15)
BUN: 18 mg/dL (ref 8–23)
CO2: 24 mmol/L (ref 22–32)
Calcium: 9.2 mg/dL (ref 8.9–10.3)
Chloride: 107 mmol/L (ref 98–111)
Creatinine, Ser: 1.36 mg/dL — ABNORMAL HIGH (ref 0.44–1.00)
GFR, Estimated: 37 mL/min — ABNORMAL LOW (ref >60)
Glucose, Bld: 94 mg/dL (ref 70–99)
Potassium: 4.4 mmol/L (ref 3.5–5.1)
Sodium: 140 mmol/L (ref 135–145)
Total Bilirubin: 0.8 mg/dL (ref 0.3–1.2)
Total Protein: 6 g/dL — ABNORMAL LOW (ref 6.5–8.1)

## 2021-04-05 LAB — ECHOCARDIOGRAM COMPLETE
AR max vel: 0.99 cm2
AV Area VTI: 1.12 cm2
AV Area mean vel: 0.97 cm2
AV Mean grad: 12 mmHg
AV Peak grad: 20.7 mmHg
Ao pk vel: 2.27 m/s
Area-P 1/2: 2.45 cm2
Height: 63 in
MV VTI: 1.54 cm2
S' Lateral: 1.1 cm
Weight: 2081.14 oz

## 2021-04-05 LAB — LIPID PANEL
Cholesterol: 148 mg/dL (ref 0–200)
HDL: 46 mg/dL (ref >40)
LDL Cholesterol: 87 mg/dL (ref 0–99)
Total CHOL/HDL Ratio: 3.2 RATIO
Triglycerides: 77 mg/dL (ref <150)
VLDL: 15 mg/dL (ref 0–40)

## 2021-04-05 LAB — CBC WITH DIFFERENTIAL/PLATELET
Abs Immature Granulocytes: 0.01 10*3/uL (ref 0.00–0.07)
Basophils Absolute: 0 10*3/uL (ref 0.0–0.1)
Basophils Relative: 1 %
Eosinophils Absolute: 0.1 10*3/uL (ref 0.0–0.5)
Eosinophils Relative: 2 %
HCT: 29.9 % — ABNORMAL LOW (ref 36.0–46.0)
Hemoglobin: 10.7 g/dL — ABNORMAL LOW (ref 12.0–15.0)
Immature Granulocytes: 0 %
Lymphocytes Relative: 47 %
Lymphs Abs: 2.9 10*3/uL (ref 0.7–4.0)
MCH: 30.9 pg (ref 26.0–34.0)
MCHC: 35.8 g/dL (ref 30.0–36.0)
MCV: 86.4 fL (ref 80.0–100.0)
Monocytes Absolute: 0.6 10*3/uL (ref 0.1–1.0)
Monocytes Relative: 10 %
Neutro Abs: 2.5 10*3/uL (ref 1.7–7.7)
Neutrophils Relative %: 40 %
Platelets: 176 10*3/uL (ref 150–400)
RBC: 3.46 MIL/uL — ABNORMAL LOW (ref 3.87–5.11)
RDW: 14.1 % (ref 11.5–15.5)
WBC: 6.2 10*3/uL (ref 4.0–10.5)
nRBC: 0 % (ref 0.0–0.2)

## 2021-04-05 LAB — MAGNESIUM: Magnesium: 1.9 mg/dL (ref 1.7–2.4)

## 2021-04-05 MED ORDER — EZETIMIBE 10 MG PO TABS
10.0000 mg | ORAL_TABLET | Freq: Every day | ORAL | Status: DC
  Administered 2021-04-05: 10 mg via ORAL
  Filled 2021-04-05: qty 1

## 2021-04-05 MED ORDER — MELATONIN 5 MG PO TABS
10.0000 mg | ORAL_TABLET | Freq: Every evening | ORAL | Status: DC | PRN
Start: 2021-04-05 — End: 2021-04-05

## 2021-04-05 MED ORDER — ACETAMINOPHEN 650 MG RE SUPP: 650.0000 mg | RECTAL | Status: DC | PRN

## 2021-04-05 MED ORDER — HEPARIN SODIUM (PORCINE) 5000 UNIT/ML IJ SOLN
5000.0000 [IU] | Freq: Three times a day (TID) | INTRAMUSCULAR | Status: DC
  Administered 2021-04-05: 5000 [IU] via SUBCUTANEOUS
  Filled 2021-04-05: qty 1

## 2021-04-05 MED ORDER — ACETAMINOPHEN 160 MG/5ML PO SOLN: 650.0000 mg | ORAL | Status: DC | PRN

## 2021-04-05 MED ORDER — PRAVASTATIN SODIUM 10 MG PO TABS
20.0000 mg | ORAL_TABLET | Freq: Every day | ORAL | Status: DC
  Administered 2021-04-05: 20 mg via ORAL
  Filled 2021-04-05: qty 2

## 2021-04-05 MED ORDER — CLOPIDOGREL BISULFATE 75 MG PO TABS: 75.0000 mg | ORAL_TABLET | Freq: Every day | ORAL | 0 refills | Status: AC

## 2021-04-05 MED ORDER — ASPIRIN EC 81 MG PO TBEC
81.0000 mg | DELAYED_RELEASE_TABLET | Freq: Every day | ORAL | Status: DC
  Administered 2021-04-05: 81 mg via ORAL
  Filled 2021-04-05: qty 1

## 2021-04-05 MED ORDER — PRAVASTATIN SODIUM 40 MG PO TABS: 40.0000 mg | ORAL_TABLET | Freq: Every day | ORAL | Status: DC

## 2021-04-05 MED ORDER — ASPIRIN 81 MG PO TBEC: 81.0000 mg | DELAYED_RELEASE_TABLET | Freq: Every day | ORAL | 11 refills | Status: DC

## 2021-04-05 MED ORDER — ACETAMINOPHEN 325 MG PO TABS: 650.0000 mg | ORAL_TABLET | ORAL | Status: DC | PRN

## 2021-04-05 MED ORDER — LACTATED RINGERS IV SOLN: INTRAVENOUS | Status: DC

## 2021-04-05 MED ORDER — PRAVASTATIN SODIUM 20 MG PO TABS: 40.0000 mg | ORAL_TABLET | Freq: Every day | ORAL | 2 refills | Status: DC

## 2021-04-05 MED ORDER — STROKE: EARLY STAGES OF RECOVERY BOOK
Freq: Once | Status: AC
  Filled 2021-04-05: qty 1

## 2021-04-05 MED ORDER — ONDANSETRON HCL 4 MG/2ML IJ SOLN: 4.0000 mg | Freq: Four times a day (QID) | INTRAMUSCULAR | Status: DC | PRN

## 2021-04-05 NOTE — ED Notes (Signed)
Pt transported to MRI 

## 2021-04-05 NOTE — Progress Notes (Signed)
EEG complete - results pending 

## 2021-04-05 NOTE — Progress Notes (Addendum)
STROKE TEAM PROGRESS NOTE  ? ?ATTENDING NOTE: ?I reviewed above note and agree with the assessment and plan. Pt was seen and examined.  ? ?88 year old female with history of hypertension, hyperlipidemia, DVT, TIA versus syncope admitted again for transient aphasia.  Patient stated that yesterday she dinner and felt lightheadedness, she sat down for dinner, shortly after eating she had difficulty getting words out when her son talk to her.  In route to ED, her symptoms resolved. ? ?Of note, in 01/2018 patient also had transient aphasia after a syncope event.  At that time she felt dizzy and passed out.  She was considered TIA versus syncope.  MRI no acute abnormality.  Old right cerebellum and left caudate lacunar infarct.  MRA left A2/A3 moderate stenosis.  Carotid Doppler negative.  LDL 113, A1c 5.8.  She was discharged on aspirin 81 and Lipitor 20. ? ?This admission CT negative.  CT head and neck unremarkable.  MRI no acute infarct.  EF 60 to 65%, EEG normal.  LDL 87, A1c 5.7.  Creatinine elevated from her baseline 1.64-1.80. ? ?On exam, patient awake alert, follows simple commands, no aphasia, orientated x3.  Neuro exam intact.  Etiology for patient's symptoms again concerning for presyncope versus TIA.  Anxiety also possible factor.  However, would like to treat as TIA.  Continue aspirin 81 and Plavix DAPT for 3 weeks and then as alone.  Continue Zetia and increase pravastatin from 20-40.  PT and OT recommend outpatient PT. ? ?For detailed assessment and plan, please refer to above as I have made changes wherever appropriate.  ? ?Neurology will sign off. Please call with questions. Pt will follow up with stroke clinic NP at [facility] in about 4 weeks. Thanks for the consult. ? ? ?Rosalin Hawking, MD PhD ?Stroke Neurology ?04/05/2021 ?7:35 PM ? ? ? ?INTERVAL HISTORY ?No family at bedside. Patient is comfortable, awake, alert. Patient reported sudden onset of dizziness and dinner last night that required her to sit down.  At  that time family member noted that patient had dysarthria.  This episode lasted about 5 minutes.  No loss of consciousness.  Denies headache, nausea, vomiting, weakness, numbness or tingling.  ?Patient was able to recall similar episode in 2020, however this time the episode was not as bad. In 2020, patient reported passing out after a similar prodrome.  At that time stroke work-up was negative, and was treated for TIA.  Patient has not been on ASA for a while.  ?Patient had no other concerns. ?This time, CT and MRI were negative for acute stroke. CTA negative for stenosis or occlusion.  EEG negative for seizure activity.  Echo EF 60-65%, G2DD.  ? ?Vitals:  ? 04/05/21 1145 04/05/21 1215 04/05/21 1245 04/05/21 1450  ?BP: (!) 154/68 (!) 149/69 (!) 158/67 (!) 143/66  ?Pulse: 61 61 61 72  ?Resp: '13 20 11 '$ (!) 21  ?Temp:    97.6 ?F (36.4 ?C)  ?TempSrc:    Oral  ?SpO2: 97% 99% 97% 100%  ?Weight:      ?Height:      ? ?CBC:  ?Recent Labs  ?Lab 04/04/21 ?1813 04/04/21 ?1819 04/05/21 ?0825  ?WBC 7.9  --  6.2  ?NEUTROABS 3.3  --  2.5  ?HGB 11.4* 11.2* 10.7*  ?HCT 30.9* 33.0* 29.9*  ?MCV 83.7  --  86.4  ?PLT 191  --  176  ? ?Basic Metabolic Panel:  ?Recent Labs  ?Lab 04/04/21 ?1813 04/04/21 ?1819 04/05/21 ?0825  ?NA 135 137  140  ?K 4.7 4.7 4.4  ?CL 102 103 107  ?CO2 22  --  24  ?GLUCOSE 119* 113* 94  ?BUN 21 24* 18  ?CREATININE 1.64* 1.80* 1.36*  ?CALCIUM 9.4  --  9.2  ?MG  --   --  1.9  ? ?Lipid Panel:  ?Recent Labs  ?Lab 04/05/21 ?0825  ?CHOL 148  ?TRIG 77  ?HDL 46  ?CHOLHDL 3.2  ?VLDL 15  ?[place] 87  ? ?HgbA1c:  ?Recent Labs  ?Lab 04/05/21 ?0825  ?HGBA1C 5.7*  ? ?Urine Drug Screen: No results for input(s): LABOPIA, COCAINSCRNUR, LABBENZ, AMPHETMU, THCU, LABBARB in the last 168 hours. ?Alcohol Level No results for input(s): ETH in the last 168 hours. ? ?IMAGING past 24 hours ?MR BRAIN WO CONTRAST ? ?Result Date: 04/05/2021 ?CLINICAL DATA:  Sudden onset aphasia/dysarthria EXAM: MRI HEAD WITHOUT CONTRAST TECHNIQUE: Multiplanar,  multiecho pulse sequences of the brain and surrounding structures were obtained without intravenous contrast. COMPARISON:  CT/CTA head and neck dated 1 day prior, MR head 05/31/2018 FINDINGS: Brain: There is no evidence of acute intracranial hemorrhage, extra-axial fluid collection, or acute infarct. There is mild global parenchymal volume loss with prominence of the ventricular system and extra-axial CSF spaces. The ventricles are stable in size compared to the prior MRI from 2020. There are patchy foci of FLAIR signal abnormality in the subcortical and periventricular white matter likely reflecting sequela of mild chronic white matter microangiopathy. There is no suspicious parenchymal signal abnormality. There is a punctate chronic microhemorrhage in the right occipital lobe periventricular white matter, nonspecific. There is no mass lesion.  There is no mass effect or midline shift. Vascular: Normal flow voids. Skull and upper cervical spine: Normal marrow signal. Sinuses/Orbits: The paranasal sinuses are clear. Bilateral lens implants are in place. The globes and orbits are otherwise unremarkable. Other: None. IMPRESSION: No acute intracranial pathology. Electronically Signed   By: Valetta Mole M.D.   On: 04/05/2021 08:12  ? ?EEG adult ? ?Result Date: 04/05/2021 ?Lora Havens, MD     04/05/2021  1:24 PM Patient Name: THEODORE RAHRIG MRN: [id number] Epilepsy Attending: Lora Havens Referring Physician/Provider: Merrily Brittle, DO Date: 04/05/2021 Duration: 21.52 mins Patient history:  88 year old female with acute onset aphasia with resolution of symptoms. EEG to evaluate for seizure Level of alertness: Awake AEDs during EEG study: None Technical aspects: This EEG study was done with scalp electrodes positioned according to the 10-20 International system of electrode placement. Electrical activity was acquired at a sampling rate of '500Hz'$  and reviewed with a high frequency filter of '70Hz'$  and a low frequency  filter of '1Hz'$ . EEG data were recorded continuously and digitally stored. Description: The posterior dominant rhythm consists of 9 Hz activity of moderate voltage (25-35 uV) seen predominantly in posterior head regions, symmetric and reactive to eye opening and eye closing. Hyperventilation and photic stimulation were not performed.   IMPRESSION: This study is within normal limits. No seizures or epileptiform discharges were seen throughout the recording. Priyanka Barbra Sarks  ? ?ECHOCARDIOGRAM COMPLETE ? ?Result Date: 04/05/2021 ?   ECHOCARDIOGRAM REPORT   Patient Name:   CHRYSTINE FROGGE Date of Exam: 04/05/2021 Medical Rec #:  [id number]     Height:       63.0 in Accession #:    [id number]    Weight:       130.1 lb Date of Birth:  June 30, 1927     BSA:          1.611  m? Patient Age:    62 years      BP:           138/66 mmHg Patient Gender: F             HR:           59 bpm. Exam Location:  Inpatient Procedure: 2D Echo, Cardiac Doppler and Color Doppler Indications:    TIA G45.9  History:        Patient has prior history of Echocardiogram examinations, most                 recent 03/26/2019. TIA; Risk Factors:Hypertension and                 Dyslipidemia. Chronic kidney disease.  Sonographer:    Darlina Sicilian RDCS Referring Phys: [place]  1. Left ventricular ejection fraction, by estimation, is 60 to 65%. The left ventricle has normal function. The left ventricle has no regional wall motion abnormalities. Left ventricular diastolic parameters are consistent with Grade II diastolic dysfunction (pseudonormalization). Elevated left ventricular end-diastolic pressure.  2. Right ventricular systolic function is normal. The right ventricular size is normal. There is normal pulmonary artery systolic pressure.  3. The mitral valve is degenerative. Mild mitral valve regurgitation. No evidence of mitral stenosis. Moderate mitral annular calcification.  4. AVA calculates low but DVI 0.44 and mean gradient only 12 mmHg  . The aortic valve is tricuspid. There is moderate calcification of the aortic valve. There is moderate thickening of the aortic valve. Aortic valve regurgitation is trivial. Mild to moderate aortic valve

## 2021-04-05 NOTE — Discharge Summary (Signed)
?Physician Discharge Summary ?  ?Patient: [name] MRN: [id number] DOB: 1927/09/09  ?Admit date:     04/04/2021  ?Discharge date: 04/05/21  ?Discharge Physician: Corrie Mckusick Sueko Dimichele  ? ?PCP: Lavone Orn, MD  ? ?Recommendations at discharge:  ? ?Follow-up with your primary care physician in 1 week. ?Follow-up with [facility] as scheduled by the clinic. ? ?Discharge Diagnoses: ?Principal Problem: ?  TIA (transient ischemic attack) ?Active Problems: ?  AKI (acute kidney injury) ([place]) ?  Essential hypertension ?  H/O hypercholesterolemia ?  History of DVT of lower extremity ?  Chronic kidney disease, stage 3a ([place]) ?  Unintentional weight loss ? ?Resolved Problems: ?  * No resolved hospital problems. * ? ?Hospital Course: ?88 year old female with past medical history of hypertension, previous history of DVT not on anticoagulation, history of TIA, hyperlipidemia, CKD stage IIIa, presented to hospital with sudden onset of aphasia and dysarthriaAt home patient was noted to be hypotensive and takes metoprolol at baseline..  In the ED patient had stable vitals.  CTA head neck and CT  of the head were negative for acute stroke and negative for LVO.  Neurology was consulted.  Patient was then admitted hospital for TIA stroke work-up. ? ? ?Assessment and Plan: ?* TIA (transient ischemic attack) ?CT head without acute findings.  She was seen by physical therapy who recommended outpatient PT on discharge.  Hemoglobin A1c was 5.7. Lipid Profile noted with LDL cholesterol of 87. Continue Zetia and pravastatin on discharge.   2D echocardiogram showed LV ejection fraction of 60 to 65% with no regional wall motion abnormalities with grade 2 diastolic dysfunction.  MRI of the brain without any acute findings EEG was negative for any seizures. ? ?AKI (acute kidney injury) ([place]) ?Patient received gentle IV fluids.  Creatinine was mildly elevated on presentation but.  Creatinine down to 1.3 after IV fluids.  Encourage  oral hydration.  Patient was advised to follow-up with primary care physician as outpatient. ? ?Unintentional weight loss ?25 pound weight loss in 1 year.  Will need outpatient follow-up. ? ?Chronic kidney disease, stage 3a ([place]) ?Baseline creatinine around 1.0.  Had mild AKI.  Creatinine improved to 1.3 after IV fluids.  Encourage oral hydration on discharge. ? ?History of DVT of lower extremity ?Has completed course of Eliquis in the past.  DVT was thought to be secondary to COVID-19 infection in the past.  No need for further anticoagulation. ? ?H/O hypercholesterolemia ?Continue Zetia and Pravachol.  Lipid panel showed LDL of 87. ? ?Essential hypertension ?  Blood pressure elevated at this time.  Patient takes a low-dose of metoprolol at home. ? ? ?Consultants: Neurology ? ?Procedures performed: None ? ?Disposition: Home with outpatient PT.  Spoke with the patient's son prior to disposition ? ?Diet recommendation:  ?Discharge Diet Orders (From admission, onward)  ? ?  Start     Ordered  ? 04/05/21 0000  Diet - low sodium heart healthy       ? 04/05/21 1423  ? ?  ?  ? ?  ? ?Cardiac diet ?DISCHARGE MEDICATION: ?Allergies as of 04/05/2021   ? ?   Reactions  ? Atorvastatin Other (See Comments)  ? Desmopressin Acetate Other (See Comments)  ? Iodinated Contrast Media Hives, Other (See Comments)  ? Myrbetriq [mirabegron] Other (See Comments)  ? "Makes me urinate more frequently"  ? Iodine Rash  ? ?  ? ?  ?Medication List  ?  ? ?TAKE these medications   ? ?acetaminophen  500 MG tablet ?Commonly known as: TYLENOL ?Take 500-1,000 mg by mouth every 8 (eight) hours as needed for mild pain. ?  ?albuterol 108 (90 Base) MCG/ACT inhaler ?Commonly known as: VENTOLIN HFA ?Inhale 2 puffs into the lungs every 4 (four) hours as needed for wheezing or shortness of breath. ?  ?aspirin 81 MG EC tablet ?Take 1 tablet (81 mg total) by mouth daily. Swallow whole. ?Start taking on: April 06, 2021 ?  ?Centrum Silver Ultra Womens Tabs ?Take 1  tablet by mouth daily with breakfast. ?  ?AIRBORNE PO ?Take 1 tablet by mouth daily with breakfast. ?  ?clopidogrel 75 MG tablet ?Commonly known as: PLAVIX ?Take 1 tablet (75 mg total) by mouth daily for 21 days. ?Start taking on: April 06, 2021 ?  ?Culturelle Caps ?Take 1 capsule by mouth every morning. ?  ?ezetimibe 10 MG tablet ?Commonly known as: ZETIA ?Take 10 mg by mouth daily. ?  ?Magnesium 250 MG Tabs ?Take 250 mg by mouth daily as needed (leg cramps). ?  ?metoprolol tartrate 25 MG tablet ?Commonly known as: LOPRESSOR ?Take 0.5 tablets by mouth 2 (two) times daily. ?  ?pravastatin 20 MG tablet ?Commonly known as: PRAVACHOL ?Take 2 tablets (40 mg total) by mouth daily. ?What changed:  ?how much to take ?when to take this ?  ?Probiotic 250 MG Caps ?Take by mouth daily. Pt isn't sure of the dosage ?  ?Trospium Chloride 60 MG Cp24 ?Take 1 capsule by mouth daily. ?  ?vitamin B-12 100 MCG tablet ?Commonly known as: CYANOCOBALAMIN ?Take 100 mcg by mouth daily. ?  ?Vitamin D-3 25 MCG (1000 UT) Caps ?Take 1,000 Units by mouth daily with breakfast. ?  ? ?  ?  ? Follow-up Information   ? ? [facility]. Schedule an appointment as soon as possible for a visit in 1 month(s).   ?Specialty: Neurology ?Why: stroke clinic ?Contact information: ?[place] [place] [place] ?[phone] ? ?  ?  ? ?  ?  ? ?  ? ?Discharge Exam: ?Filed Weights  ? 04/04/21 1800 04/04/21 1838  ?Weight: 59 kg 59 kg  ? ?Vitals with BMI 04/05/2021 04/05/2021 04/05/2021  ?Height - - -  ?Weight - - -  ?BMI - - -  ?Systolic 427 062 376  ?Diastolic 67 69 68  ?Pulse 61 61 61  ?  ?General:  Average built, not in obvious distress ?HENT:   No scleral pallor or icterus noted. Oral mucosa is moist.  ?Chest:  Clear breath sounds.  Diminished breath sounds bilaterally. No crackles or wheezes.  ?CVS: S1 &S2 heard. No murmur.  Regular rate and rhythm. ?Abdomen: Soft, nontender, nondistended.  Bowel sounds are heard.    ?Extremities: No cyanosis, clubbing or edema.  Peripheral pulses are palpable. ?Psych: Alert, awake and oriented, normal mood ?CNS:  No cranial nerve deficits.  Power equal in all extremities.   ?Skin: Warm and dry.  No rashes noted. ? ?Condition at discharge: good ? ?The results of significant diagnostics from this hospitalization (including imaging, microbiology, ancillary and laboratory) are listed below for reference.  ? ?Imaging Studies: ?MR BRAIN WO CONTRAST ? ?Result Date: 04/05/2021 ?CLINICAL DATA:  Sudden onset aphasia/dysarthria EXAM: MRI HEAD WITHOUT CONTRAST TECHNIQUE: Multiplanar, multiecho pulse sequences of the brain and surrounding structures were obtained without intravenous contrast. COMPARISON:  CT/CTA head and neck dated 1 day prior, MR head 05/31/2018 FINDINGS: Brain: There is no evidence of acute intracranial hemorrhage, extra-axial fluid collection, or acute  infarct. There is mild global parenchymal volume loss with prominence of the ventricular system and extra-axial CSF spaces. The ventricles are stable in size compared to the prior MRI from 2020. There are patchy foci of FLAIR signal abnormality in the subcortical and periventricular white matter likely reflecting sequela of mild chronic white matter microangiopathy. There is no suspicious parenchymal signal abnormality. There is a punctate chronic microhemorrhage in the right occipital lobe periventricular white matter, nonspecific. There is no mass lesion.  There is no mass effect or midline shift. Vascular: Normal flow voids. Skull and upper cervical spine: Normal marrow signal. Sinuses/Orbits: The paranasal sinuses are clear. Bilateral lens implants are in place. The globes and orbits are otherwise unremarkable. Other: None. IMPRESSION: No acute intracranial pathology. Electronically Signed   By: Valetta Mole M.D.   On: 04/05/2021 08:12  ? ?EEG adult ? ?Result Date: 04/05/2021 ?Lora Havens, MD     04/05/2021  1:24 PM Patient Name:  WILEEN DUNCANSON MRN: [id number] Epilepsy Attending: Lora Havens Referring Physician/Provider: Merrily Brittle, DO Date: 04/05/2021 Duration: 21.52 mins Patient history:  88 year old female with acute ons

## 2021-04-05 NOTE — Procedures (Signed)
Patient Name: GRISELDA BRAMBLETT  ?MRN: [id number]  ?Epilepsy Attending: Lora Havens  ?Referring Physician/Provider: Merrily Brittle, DO ?Date: 04/05/2021 ?Duration: 21.52 mins ? ?Patient history:  88 year old female with acute onset aphasia with resolution of symptoms. EEG to evaluate for seizure ? ?Level of alertness: Awake ? ?AEDs during EEG study: None ? ?Technical aspects: This EEG study was done with scalp electrodes positioned according to the 10-20 International system of electrode placement. Electrical activity was acquired at a sampling rate of '500Hz'$  and reviewed with a high frequency filter of '70Hz'$  and a low frequency filter of '1Hz'$ . EEG data were recorded continuously and digitally stored.  ? ?Description: The posterior dominant rhythm consists of 9 Hz activity of moderate voltage (25-35 uV) seen predominantly in posterior head regions, symmetric and reactive to eye opening and eye closing. Hyperventilation and photic stimulation were not performed.    ? ?IMPRESSION: ?This study is within normal limits. No seizures or epileptiform discharges were seen throughout the recording. ? ?Lora Havens  ? ?

## 2021-04-05 NOTE — Progress Notes (Signed)
?  Echocardiogram ?2D Echocardiogram has been performed. ? [name] ?04/05/2021, 11:21 AM ?

## 2021-04-05 NOTE — Evaluation (Signed)
SLP Cancellation Note ? ?Patient Details ?Name: [name] ?MRN: [id number] ?DOB: [birth year] ? ? ?Cancelled treatment:    Pt screened, no SLP evaluation needed. Our service will sign off. ? ?Negin Hegg L. Aziah Kaiser, MA CCC/SLP ?Acute Rehabilitation Services ?Office number [phone] ?Pager [phone] ?     ? ? ?[name] ?04/05/2021, 10:06 AM ? ? ?

## 2021-04-05 NOTE — Evaluation (Signed)
Occupational Therapy Evaluation ?Patient Details ?Name: [name] ?MRN: [id number] ?DOB: 04/21/1927 ?Today's Date: 04/05/2021 ? ? ?History of Present Illness Pt is a 88 yo female presenting to ED 04/04/21 with acute onset of dysarthria, aphasia, and a HA. Imaging is negative. PMH includes DVT, GERD, anemia, osteopenia, and urge incontinence.  ? ?Clinical Impression ?  ?PTA patient reports independent with ADLs, IADLs and driving. Admitted for above and presents with mild unsteadiness, decreased activity tolerance and weakness.  Patient requires min guard for ADLs and mobility in room.  Anticipate she will progress well.  Based on performance today, believe she will benefit from continued OT services acutely to optimize independence and tolerance with ADLs and mobility.  No further OT needs after dc home.   ?   ? ?Recommendations for follow up therapy are one component of a multi-disciplinary discharge planning process, led by the attending physician.  Recommendations may be updated based on patient status, additional functional criteria and insurance authorization.  ? ?Follow Up Recommendations ? No OT follow up  ?  ?Assistance Recommended at Discharge Intermittent Supervision/Assistance  ?Patient can return home with the following A little help with walking and/or transfers;A little help with bathing/dressing/bathroom;Help with stairs or ramp for entrance;Assistance with cooking/housework ? ?  ?Functional Status Assessment ? Patient has had a recent decline in their functional status and demonstrates the ability to make significant improvements in function in a reasonable and predictable amount of time.  ?Equipment Recommendations ? Tub/shower seat  ?  ?Recommendations for Other Services PT consult ? ? ?  ?Precautions / Restrictions Precautions ?Precautions: Fall ?Restrictions ?Weight Bearing Restrictions: No  ? ?  ? ?Mobility Bed Mobility ?Overal bed mobility: Needs Assistance ?Bed Mobility: Supine to Sit, Sit to  Supine ?  ?  ?Supine to sit: Min guard ?Sit to supine: Min guard ?  ?General bed mobility comments: for safety to/from elevated ED stretcher ?  ? ?Transfers ?  ?  ?  ?  ?  ?  ?  ?  ?  ?  ?  ? ?  ?Balance Overall balance assessment: Needs assistance ?Sitting-balance support: No upper extremity supported, Feet supported ?Sitting balance-Leahy Scale: Good ?  ?  ?Standing balance support: No upper extremity supported, During functional activity ?Standing balance-Leahy Scale: Fair ?Standing balance comment: mild unsteadiness dynamically during ADLs ?  ?  ?  ?  ?  ?  ?  ?  ?  ?  ?  ?   ? ?ADL either performed or assessed with clinical judgement  ? ?ADL Overall ADL's : Needs assistance/impaired ?  ?  ?Grooming: Set up;Sitting ?  ?  ?  ?  ?  ?Upper Body Dressing : Set up;Sitting ?  ?Lower Body Dressing: Min guard;Sit to/from stand ?  ?Toilet Transfer: Min guard;Ambulation;BSC/3in1 ?  ?Toileting- Water quality scientist and Hygiene: Min guard;Sit to/from stand ?  ?  ?  ?Functional mobility during ADLs: Min guard ?   ? ? ? ?Vision Baseline Vision/History: 0 No visual deficits ?Patient Visual Report: No change from baseline ?Vision Assessment?: No apparent visual deficits  ?   ?Perception   ?  ?Praxis   ?  ? ?Pertinent Vitals/Pain Pain Assessment ?Pain Assessment: No/denies pain  ? ? ? ?Hand Dominance Right ?  ?Extremity/Trunk Assessment Upper Extremity Assessment ?Upper Extremity Assessment: Overall WFL for tasks assessed ?  ?Lower Extremity Assessment ?Lower Extremity Assessment: Defer to PT evaluation ?  ?  ?  ?Communication Communication ?Communication: No difficulties ?  ?  Cognition Arousal/Alertness: Awake/alert ?Behavior During Therapy: [facility] for tasks assessed/performed ?Overall Cognitive Status: Within Functional Limits for tasks assessed ?  ?  ?  ?  ?  ?  ?  ?  ?  ?  ?  ?  ?  ?  ?  ?  ?  ?  ?  ?General Comments  VSS on RA ? ?  ?Exercises   ?  ?Shoulder Instructions    ? ? ?Home Living Family/patient expects to be  discharged to:: Private residence ?Living Arrangements: Spouse/significant other ?Available Help at Discharge: Family;Available 24 hours/day ?Type of Home: House ?Home Access: Stairs to enter ?Entrance Stairs-Number of Steps: 2 ?Entrance Stairs-Rails: Right ?Home Layout: One level ?  ?  ?Bathroom Shower/Tub: Tub/shower unit ?  ?Bathroom Toilet: Standard ?  ?  ?Home Equipment: Grab bars - tub/shower;Rolling Walker (2 wheels) ?  ?  ?  ? ?  ?Prior Functioning/Environment Prior Level of Function : Independent/Modified Independent;Driving ?  ?  ?  ?  ?  ?  ?  ?ADLs Comments: indep ADLs/IADLs, driving ?  ? ?  ?  ?OT Problem List: Decreased strength;Decreased activity tolerance;Impaired balance (sitting and/or standing) ?  ?   ?OT Treatment/Interventions: Self-care/ADL training;DME and/or AE instruction;Therapeutic activities;Patient/family education;Balance training  ?  ?OT Goals(Current goals can be found in the care plan section) Acute Rehab OT Goals ?Patient Stated Goal: home ?OT Goal Formulation: With patient ?Time For Goal Achievement: 04/19/21 ?Potential to Achieve Goals: Good  ?OT Frequency: Min 2X/week ?  ? ?Co-evaluation   ?  ?  ?  ?  ? ?  ?AM-PAC OT "6 Clicks" Daily Activity     ?Outcome Measure Help from another person eating meals?: A Little ?Help from another person taking care of personal grooming?: A Little ?Help from another person toileting, which includes using toliet, bedpan, or urinal?: A Little ?Help from another person bathing (including washing, rinsing, drying)?: A Little ?Help from another person to put on and taking off regular upper body clothing?: A Little ?Help from another person to put on and taking off regular lower body clothing?: A Little ?6 Click Score: 18 ?  ?End of Session Nurse Communication: Mobility status ? ?Activity Tolerance: Patient tolerated treatment well ?Patient left: in bed;with call bell/phone within reach ? ?OT Visit Diagnosis: Unsteadiness on feet (R26.81);Muscle  weakness (generalized) (M62.81)  ?              ?Time: 0092-3300 ?OT Time Calculation (min): 21 min ?Charges:  OT General Charges ?$OT Visit: 1 Visit ?OT Evaluation ?$OT Eval Moderate Complexity: 1 Mod ? ?Jolaine Artist, OT ?Acute Rehabilitation Services ?Pager [phone] ?Office [phone] ? ? ?Delight Stare ?04/05/2021, 10:23 AM ?

## 2021-04-05 NOTE — Hospital Course (Signed)
88 year old female with past medical history of hypertension, previous history of DVT not on anticoagulation, history of TIA, hyperlipidemia, CKD stage IIIa, presented to hospital with sudden onset of aphasia and dysarthriaAt home patient was noted to be hypotensive and takes metoprolol at baseline..  In the ED patient had stable vitals.  CTA head neck and CT  of the head were negative for acute stroke and negative for LVO.  Neurology was consulted.  Patient was then admitted hospital for TIA stroke work-up. ? ?

## 2021-04-05 NOTE — Evaluation (Signed)
Physical Therapy Evaluation ?Patient Details ?Name: [name] ?MRN: [id number] ?DOB: March 20, 1927 ?Today's Date: 04/05/2021 ? ?History of Present Illness ? Pt is a 88 yo female presenting to ED 04/04/21 with acute onset of dysarthria, aphasia, and a HA. Imaging is negative. PMH includes DVT, GERD, anemia, osteopenia, and urge incontinence.  ?Clinical Impression ? Patient presented to the ED on 04/04/21 with aphasia, dysarthria, and a HA consistent with patient's diagnosis of TIA. Pt's impairments include decreased RLE strength, power, and balance with mobility. These impairments are limiting her ability to safely and independently transfer, ambulate, and navigate stairs. Patient requires min guard A with all functional mobility without an assistive device. Pt had no apparent LOB with DGI tasks, however, was very cautious and slow with ambulation and dual tasking. SPT recommending outpatient PT upon D/C due to mobility deficits. PT will continue to follow acutely to maximize pt's independence and safety with functional mobility. ?   ?   ? ?Recommendations for follow up therapy are one component of a multi-disciplinary discharge planning process, led by the attending physician.  Recommendations may be updated based on patient status, additional functional criteria and insurance authorization. ? ?Follow Up Recommendations Outpatient PT ? ?  ?Assistance Recommended at Discharge Frequent or constant Supervision/Assistance  ?Patient can return home with the following ? A little help with walking and/or transfers;A little help with bathing/dressing/bathroom;Assistance with cooking/housework;Assist for transportation;Help with stairs or ramp for entrance ? ?  ?Equipment Recommendations Kasandra Knudsen  ?Recommendations for Other Services ?    ?  ?Functional Status Assessment Patient has had a recent decline in their functional status and demonstrates the ability to make significant improvements in function in a reasonable and predictable  amount of time.  ? ?  ?Precautions / Restrictions Precautions ?Precautions: Fall ?Restrictions ?Weight Bearing Restrictions: No  ? ?  ? ?Mobility ? Bed Mobility ?Overal bed mobility: Needs Assistance ?Bed Mobility: Supine to Sit, Sit to Supine ?  ?  ?Supine to sit: Min guard, HOB elevated ?Sit to supine: Min assist ?  ?General bed mobility comments: min guard required for safety with elevation to sitting. Pt reached out for HHA but did not require assist for trunk elevation. Pt required min A for LE management for sit to supine. ?  ? ?Transfers ?Overall transfer level: Needs assistance ?Equipment used: None ?Transfers: Sit to/from Stand ?Sit to Stand: Min guard, From elevated surface ?  ?  ?  ?  ?  ?General transfer comment: pt required min guard for safety and steadying upon standing. ?  ? ?Ambulation/Gait ?Ambulation/Gait assistance: Min guard ?Gait Distance (Feet): 40 Feet ?Assistive device: None ?Gait Pattern/deviations: Step-through pattern, Decreased step length - right, Decreased step length - left, Decreased stride length, Knee flexed in stance - right, Knee flexed in stance - left, Narrow base of support ?Gait velocity: decreased ?Gait velocity interpretation: <1.8 ft/sec, indicate of risk for recurrent falls ?  ?General Gait Details: pt had slow, cautious gait with no apparent LOB with DGI tasks. SPT noted decreased gait speed with dual tasking of DGI tasks. Pt often reaching out to toward counter or wall, however, never grabbed on. Pt required HHA with turning and felt dizzy when turning during ambulation. Pt verbally stating "I just feel so weak" and reports that she normally walks faster at home. ? ?Stairs ?  ?  ?  ?  ?  ? ?Wheelchair Mobility ?  ? ?Modified Rankin (Stroke Patients Only) ?Modified Rankin (Stroke Patients Only) ?Pre-Morbid Rankin Score: No  symptoms ?Modified Rankin: Slight disability ? ?  ? ?Balance Overall balance assessment: Needs assistance ?Sitting-balance support: No upper extremity  supported, Feet supported ?Sitting balance-Leahy Scale: Good ?Sitting balance - Comments: pt steady and in sitting ?  ?Standing balance support: No upper extremity supported, During functional activity ?Standing balance-Leahy Scale: Fair ?Standing balance comment: pt required min guard assist with ambulation with mild unsteadiness noted, however, no aparrent LOB. ?  ?  ?  ?  ?  ?  ?  ?  ?Standardized Balance Assessment ?Standardized Balance Assessment : Dynamic Gait Index ?  ?Dynamic Gait Index ?Level Surface: Mild Impairment ?Gait with Horizontal Head Turns: Mild Impairment ?Gait with Vertical Head Turns: Mild Impairment ?Gait and Pivot Turn: Moderate Impairment ?Step Over Obstacle: Mild Impairment ?Step Around Obstacles: Mild Impairment ?   ? ? ? ?Pertinent Vitals/Pain Pain Assessment ?Pain Assessment: Faces ?Faces Pain Scale: No hurt  ? ? ?Home Living Family/patient expects to be discharged to:: Private residence ?Living Arrangements: Spouse/significant other ?Available Help at Discharge: Family;Available 24 hours/day ?Type of Home: House ?Home Access: Stairs to enter ?Entrance Stairs-Rails: Right ?Entrance Stairs-Number of Steps: 2 ?  ?Home Layout: One level ?Home Equipment: Grab bars - tub/shower;Rolling Walker (2 wheels) ?Additional Comments: spouse has a cane  ?  ?Prior Function Prior Level of Function : Independent/Modified Independent;Driving ?  ?  ?  ?  ?  ?  ?Mobility Comments: mod I for mobility without an AD ?ADLs Comments: indep ADLs/IADLs, driving ?  ? ? ?Hand Dominance  ? Dominant Hand: Right ? ?  ?Extremity/Trunk Assessment  ? Upper Extremity Assessment ?Upper Extremity Assessment: Defer to OT evaluation ?  ? ?Lower Extremity Assessment ?Lower Extremity Assessment: RLE deficits/detail;Generalized weakness;LLE deficits/detail ?RLE Deficits / Details: RLE 3+/5 MMTs. Pt experienced difficulty sequencing with heel to shin and foot tapping bilaterally most likely age related. ?RLE Sensation: WNL ?RLE  Coordination: decreased gross motor ?LLE Deficits / Details: LLE 4+/5 MMTs; Pt experienced difficulty sequencing with heel to shin and foot tapping bilaterally most likely age related. ?LLE Sensation: WNL ?LLE Coordination: decreased gross motor ?  ? ?Cervical / Trunk Assessment ?Cervical / Trunk Assessment: Kyphotic  ?Communication  ? Communication: No difficulties  ?Cognition Arousal/Alertness: Awake/alert ?Behavior During Therapy: [facility] for tasks assessed/performed ?Overall Cognitive Status: Within Functional Limits for tasks assessed ?  ?  ?  ?  ?  ?  ?  ?  ?  ?  ?  ?  ?  ?  ?  ?  ?  ?  ?  ? ?  ?General Comments General comments (skin integrity, edema, etc.): VSS on RA ? ?  ?Exercises    ? ?Assessment/Plan  ?  ?PT Assessment Patient needs continued PT services  ?PT Problem List Decreased strength;Decreased activity tolerance;Decreased balance;Decreased mobility;Decreased coordination;Decreased knowledge of use of DME ? ?   ?  ?PT Treatment Interventions DME instruction;Gait training;Stair training;Therapeutic activities;Functional mobility training;Therapeutic exercise;Balance training;Neuromuscular re-education;Patient/family education   ? ?PT Goals (Current goals can be found in the Care Plan section)  ?Acute Rehab PT Goals ?Patient Stated Goal: to go home ?PT Goal Formulation: With patient ?Time For Goal Achievement: 04/19/21 ?Potential to Achieve Goals: Good ? ?  ?Frequency Min 3X/week ?  ? ? ?Co-evaluation   ?  ?  ?  ?  ? ? ?  ?AM-PAC PT "6 Clicks" Mobility  ?Outcome Measure Help needed turning from your back to your side while in a flat bed without using bedrails?: None ?Help needed moving from  lying on your back to sitting on the side of a flat bed without using bedrails?: A Little ?Help needed moving to and from a bed to a chair (including a wheelchair)?: A Little ?Help needed standing up from a chair using your arms (e.g., wheelchair or bedside chair)?: A Little ?Help needed to walk in hospital room?: A  Little ?Help needed climbing 3-5 steps with a railing? : A Lot ?6 Click Score: 18 ? ?  ?End of Session Equipment Utilized During Treatment: Gait belt ?Activity Tolerance: Patient tolerated treatment wel

## 2021-04-08 ENCOUNTER — Ambulatory Visit: Payer: Medicare PPO

## 2021-04-12 ENCOUNTER — Other Ambulatory Visit: Payer: Self-pay

## 2021-04-12 ENCOUNTER — Encounter (INDEPENDENT_AMBULATORY_CARE_PROVIDER_SITE_OTHER): Payer: Medicare PPO | Admitting: Ophthalmology

## 2021-04-12 DIAGNOSIS — I10 Essential (primary) hypertension: Secondary | ICD-10-CM | POA: Diagnosis not present

## 2021-04-12 DIAGNOSIS — H35033 Hypertensive retinopathy, bilateral: Secondary | ICD-10-CM

## 2021-04-12 DIAGNOSIS — H353231 Exudative age-related macular degeneration, bilateral, with active choroidal neovascularization: Secondary | ICD-10-CM

## 2021-04-12 DIAGNOSIS — H43813 Vitreous degeneration, bilateral: Secondary | ICD-10-CM

## 2021-04-18 ENCOUNTER — Encounter: Payer: Self-pay | Admitting: Cardiology

## 2021-04-18 NOTE — Assessment & Plan Note (Signed)
Completed course of Eliquis.  No significant edema. ?

## 2021-04-18 NOTE — Assessment & Plan Note (Signed)
She is taking pravastatin but not every day.  Also on Zetia. ?As of October, labs look pretty good.  She tells me she does not take outside every day.  That is probably okay. ?

## 2021-04-18 NOTE — Assessment & Plan Note (Signed)
Blood pressure looks good on low-dose beta-blocker.  No change. ?

## 2021-04-18 NOTE — Assessment & Plan Note (Signed)
Has lost weight.  Stressed importance of increased nutrition. ?

## 2021-04-20 DIAGNOSIS — E7800 Pure hypercholesterolemia, unspecified: Secondary | ICD-10-CM | POA: Diagnosis not present

## 2021-04-20 DIAGNOSIS — R634 Abnormal weight loss: Secondary | ICD-10-CM | POA: Diagnosis not present

## 2021-04-20 DIAGNOSIS — N1831 Chronic kidney disease, stage 3a: Secondary | ICD-10-CM | POA: Diagnosis not present

## 2021-04-20 DIAGNOSIS — G459 Transient cerebral ischemic attack, unspecified: Secondary | ICD-10-CM | POA: Diagnosis not present

## 2021-05-03 ENCOUNTER — Encounter: Payer: Self-pay | Admitting: Physical Therapy

## 2021-05-03 ENCOUNTER — Ambulatory Visit: Payer: Medicare PPO | Attending: Internal Medicine | Admitting: Physical Therapy

## 2021-05-03 DIAGNOSIS — M6281 Muscle weakness (generalized): Secondary | ICD-10-CM | POA: Diagnosis not present

## 2021-05-03 DIAGNOSIS — R42 Dizziness and giddiness: Secondary | ICD-10-CM | POA: Diagnosis not present

## 2021-05-03 DIAGNOSIS — R2689 Other abnormalities of gait and mobility: Secondary | ICD-10-CM | POA: Diagnosis not present

## 2021-05-03 DIAGNOSIS — R2681 Unsteadiness on feet: Secondary | ICD-10-CM | POA: Diagnosis not present

## 2021-05-03 NOTE — Therapy (Signed)
?OUTPATIENT PHYSICAL THERAPY NEURO EVALUATION ? ? ?Patient Name: [name] ?MRN: [id number] ?DOB:03/05/1927, 88 y.o., female ?Today's Date: 05/04/2021 ? ?PCP: Lavone Orn, MD ?REFERRING PROVIDER: Lavone Orn, MD ? ? PT End of Session - 05/03/21 1324   ? ? Visit Number 1   ? Number of Visits 9   8+eval  ? Date for PT Re-Evaluation 07/02/21   ? Authorization Type [employer] MEDICARE CHOICE PPO   ? PT Start Time 1315   ? PT Stop Time 6720   ? PT Time Calculation (min) 49 min   ? Equipment Utilized During Treatment Gait belt   ? Activity Tolerance Patient tolerated treatment well   ? Behavior During Therapy [facility] for tasks assessed/performed   ? ?  ?  ? ?  ? ? ?Past Medical History:  ?Diagnosis Date  ? Abnormal Pap smear 1975-76  ? Acute deep vein thrombosis (DVT) of left peroneal vein (HCC)   ? Anemia   ? history of  ? Arthritis   ? spine  ? Breast cyst, left 1980  ? Cystocele 2012  ? Diverticulosis   ? with h/o Diverticulitis  ? DVT, lower extremity, distal, chronic ([place]) 12/24/2018  ? GERD (gastroesophageal reflux disease)   ? H/O hypercholesterolemia   ? H/O osteopenia   ? H/O varicella   ? H/O varicose veins   ? Hx of Mumps   ? Macular degeneration   ? Seasonal allergies   ? Urge incontinence 2012  ? Urinary frequency 2010  ? ?Past Surgical History:  ?Procedure Laterality Date  ? ABDOMINAL HYSTERECTOMY    ? BLADDER SUSPENSION N/A 06/16/2016  ? Procedure: TRANSVAGINAL TAPE (TVT) PROCEDURE;  Surgeon: Everett Graff, MD;  Location: [facility];  Service: Gynecology;  Laterality: N/A;  ? BREAST CYST ASPIRATION  1964  ? COLONOSCOPY    ? Dr. Earlean Shawl  ? CYSTOCELE REPAIR N/A 06/16/2016  ? Procedure: ANTERIOR REPAIR (CYSTOCELE);  Surgeon: Everett Graff, MD;  Location: [facility];  Service: Gynecology;  Laterality: N/A;  ? CYSTOSCOPY N/A 06/16/2016  ? Procedure: CYSTOSCOPY;  Surgeon: Everett Graff, MD;  Location: [facility];  Service: Gynecology;  Laterality: N/A;  see anterior repair  ? Lower Extremity Venous Dopplers  01/09/2012  ?  Right and left lower steroids: No evidence of thrombus or, thrombophlebitis; right and left GSV and SSV: No venous insufficiency. Normal exam.  ? NM MYOVIEW LTD  11/2016  ? 6.6 METS.  Reached 106% of max.  Heart rate.  Walk for 4:40 min.  EF 72%.  Normal blood pressure response.  upsloping ST segment depression, nonspecific.  Otherwise normal study.  LOW RISK.  No evidence of ischemia or infarction.  ? TRANSTHORACIC ECHOCARDIOGRAM  01/2018  ? EF > 65%. LV size small. Gr 1 DD (normal for age).   Normal atrial size. Moderate Aortic Sclerosis without Stenosis. Moderate Mitral Annular Calcification with mild MR & no MS  ? ?Patient Active Problem List  ? Diagnosis Date Noted  ? History of DVT of lower extremity 04/04/2021  ? Chronic kidney disease, stage 3a ([place]) 04/04/2021  ? AKI (acute kidney injury) ([place]) 04/04/2021  ? Unintentional weight loss 04/04/2021  ? Dyslipidemia, goal LDL below 100 04/12/2020  ? Palpitations 05/30/2018  ? Irregular heartbeat 03/02/2018  ? TIA (transient ischemic attack) 02/15/2018  ? Urinary, incontinence, stress female 06/16/2016  ? Right carotid bruit 12/23/2014  ? Exertional dyspnea 12/24/2013  ? Bilateral arm numbness and tingling while sleeping - and shortly after waking 12/24/2013  ? Poor  balance 12/24/2013  ? Edema of left lower extremity 12/21/2012  ? Essential hypertension   ? H/O hypercholesterolemia   ? Osteoporosis - of the spine 09/22/2011  ? ? ?ONSET DATE: 04/20/2021  ? ?REFERRING DIAG: G45.9 (ICD-10-CM) - Transient cerebral ischemic attack, unspecified  ? ?THERAPY DIAG:  ?Unsteadiness on feet - Plan: PT plan of care cert/re-cert ? ?Muscle weakness (generalized) - Plan: PT plan of care cert/re-cert ? ?Other abnormalities of gait and mobility - Plan: PT plan of care cert/re-cert ? ?Dizziness and giddiness - Plan: PT plan of care cert/re-cert ? ?SUBJECTIVE:  ?                                                                                                                                                                                            ? ?SUBJECTIVE STATEMENT: ?" I had been doing fine, of course, I did have the [place] in early 2021.  We kind of got over that, but this particular Sunday I was cooking dinner.  We sat down and I started eating and my son asked me a question and I couldn't answer him.  My son knew exactly what was happening because it has happened before.  They called 911 and I went and laid down on the couch because I couldn't finish eating.  Next thing I know is the house was full of EMTs saying I needed to go to the hospital.  They took me to [facility] and they did all these tests and put me on a blood thinner and told me to follow up with my primary doctor, Dr. Laurann Thornton.  Dr. Laurann Thornton says he doesn't think I had much of a stroke, but that something went on in my brain.  I think something went on." ?Pt accompanied by: self ? ?PERTINENT HISTORY: 04/04/2021-TIA presented to [facility], per ED note:  "88 year old female presents with acute onset of aphasia was last for several hours.  Had associated diffuse weakness throughout her body.  Some headache but no facial droop.  Symptoms resolved spontaneously" ? ?PMH:   HTN, hypercholestremia, LLE edema, spinal osteoporosis-not on fosamax, R carotid bruit, urinary stress incontinence, exertional dyspnea, AKI 04/04/2021, hx of DVT, irregular heartbeat ? ?Pt known to clinic prior from June 2021 for dizziness post-COVID. ? ? ?PAIN:  ?Are you having pain? No ? ?PRECAUTIONS: Fall ? ?WEIGHT BEARING RESTRICTIONS No ? ?FALLS: Has patient fallen in last 6 months? No ? Has fallen before this time period, but not in the last 6 months.  Mostly trips over things.  She passed out when she had COVID. ?LIVING ENVIRONMENT: ?Lives with: lives with  their spouse-[place] ?Lives in: House/apartment ?Stairs: Yes: External: 3 steps; on right going up ?Has following equipment at home: Single point cane, Walker - 2 wheeled, and Grab bars ? ?PLOF:  Independent-she takes care of husband who has many comorbidities, pt drives herself ? ?PATIENT GOALS "To get rid of my balance and dizziness" - pt states she uses a post-it note and performs head nods and shakes to help with dizziness. ? ?OBJECTIVE:  ? ?DIAGNOSTIC FINDINGS: CT from 04/04/2021 and MRI of Brain from 04/05/2021 show no acute intracranial abnormalities.  Per CT:  "No large vessel occlusion or proximal hemodynamically significant stenosis in the head or neck." ? ?COGNITION: ?Overall cognitive status: Difficulty to assess, No family/caregiver present to determine baseline cognitive functioning, and She states her memory is not as sharp. ?  ?SENSATION: ?WFL ? ?COORDINATION: ?Finger-to-nose: WFL ?Heel-to-shin:  Limited AROM, otherwise normal w/in modified ROM ?Fingertip opposition:  WFL ?UE RAMPS:  WFL ?LE RAMPS:  uncoordinated w/ inc speed ? ?EDEMA:  ?None noted in BUE/BLE ? ?MUSCLE TONE: None noted BLE ? ?POSTURE: No Significant postural limitations ? ?LE ROM:    ? ?Active  Right ?05/04/2021 Left ?05/04/2021  ?Hip flexion [facility] WFL  ?Hip extension    ?Hip abduction " "  ?Hip adduction " "  ?Hip internal rotation    ?Hip external rotation    ?Knee flexion " "  ?Knee extension " "  ?Ankle dorsiflexion " "  ?Ankle plantarflexion " "  ?Ankle inversion    ?Ankle eversion    ? (Blank rows = not tested) ? ?MMT:   ? ?MMT Right ?05/04/2021 Left ?05/04/2021  ?Hip flexion 3+/5 3+/5  ?Hip extension    ?Hip abduction 5/5; tested in sitting 5/5; tested in sitting  ?Hip adduction 5/5 5/5  ?Hip internal rotation    ?Hip external rotation    ?Knee flexion 4-/5 4/5  ?Knee extension 4-/5 4-/5  ?Ankle dorsiflexion 5/5 5/5  ?Ankle plantarflexion 4+/5 5/5  ?Ankle inversion    ?Ankle eversion    ?(Blank rows = not tested) ? ?TRANSFERS: ?Assistive device utilized: None  ?Sit to stand: Complete Independence ?Stand to sit: Complete Independence ?Chair to chair: Complete Independence ? ?VITALS: ?LUE BP assessed in sitting prior to  activity:  148/78 ?Pt states it fluctuates at home from low to high, she has electric brachial cuff at home and checks BP since having COVID. ? ?Pt was walking 4 days a week on outdoor trail prior to [place]. ?FUNCTIO

## 2021-05-10 ENCOUNTER — Encounter (INDEPENDENT_AMBULATORY_CARE_PROVIDER_SITE_OTHER): Payer: Medicare PPO | Admitting: Ophthalmology

## 2021-05-10 DIAGNOSIS — I10 Essential (primary) hypertension: Secondary | ICD-10-CM

## 2021-05-10 DIAGNOSIS — H35033 Hypertensive retinopathy, bilateral: Secondary | ICD-10-CM | POA: Diagnosis not present

## 2021-05-10 DIAGNOSIS — H43813 Vitreous degeneration, bilateral: Secondary | ICD-10-CM

## 2021-05-10 DIAGNOSIS — H353231 Exudative age-related macular degeneration, bilateral, with active choroidal neovascularization: Secondary | ICD-10-CM | POA: Diagnosis not present

## 2021-05-11 ENCOUNTER — Ambulatory Visit: Payer: Medicare PPO | Admitting: Physical Therapy

## 2021-05-11 ENCOUNTER — Encounter: Payer: Self-pay | Admitting: Physical Therapy

## 2021-05-11 DIAGNOSIS — R2689 Other abnormalities of gait and mobility: Secondary | ICD-10-CM

## 2021-05-11 DIAGNOSIS — M6281 Muscle weakness (generalized): Secondary | ICD-10-CM | POA: Diagnosis not present

## 2021-05-11 DIAGNOSIS — R2681 Unsteadiness on feet: Secondary | ICD-10-CM

## 2021-05-11 DIAGNOSIS — R42 Dizziness and giddiness: Secondary | ICD-10-CM | POA: Diagnosis not present

## 2021-05-11 NOTE — Therapy (Signed)
?OUTPATIENT PHYSICAL THERAPY TREATMENT NOTE ? ? ?Patient Name: [name] ?MRN: [id number] ?DOB:1927/07/04, 88 y.o., female ?Today's Date: 05/12/2021 ? ?PCP: Lavone Orn, MD ?REFERRING PROVIDER: Lavone Orn, MD ? ?END OF SESSION:  ? PT End of Session - 05/11/21 1323   ? ? Visit Number 2   ? Number of Visits 9   8+eval  ? Date for PT Re-Evaluation 07/02/21   ? Authorization Type HUMANA MEDICARE CHOICE PPO   ? Progress Note Due on Visit 10   ? PT Start Time 2951   ? PT Stop Time 1400   ? PT Time Calculation (min) 43 min   ? Equipment Utilized During Treatment Gait belt   ? Activity Tolerance Patient tolerated treatment well   ? Behavior During Therapy [facility] for tasks assessed/performed   ? ?  ?  ? ?  ? ? ?Past Medical History:  ?Diagnosis Date  ? Abnormal Pap smear 1975-76  ? Acute deep vein thrombosis (DVT) of left peroneal vein (HCC)   ? Anemia   ? history of  ? Arthritis   ? spine  ? Breast cyst, left 1980  ? Cystocele 2012  ? Diverticulosis   ? with h/o Diverticulitis  ? DVT, lower extremity, distal, chronic ([place]) 12/24/2018  ? GERD (gastroesophageal reflux disease)   ? H/O hypercholesterolemia   ? H/O osteopenia   ? H/O varicella   ? H/O varicose veins   ? Hx of Mumps   ? Macular degeneration   ? Seasonal allergies   ? Urge incontinence 2012  ? Urinary frequency 2010  ? ?Past Surgical History:  ?Procedure Laterality Date  ? ABDOMINAL HYSTERECTOMY    ? BLADDER SUSPENSION N/A 06/16/2016  ? Procedure: TRANSVAGINAL TAPE (TVT) PROCEDURE;  Surgeon: Everett Graff, MD;  Location: [facility];  Service: Gynecology;  Laterality: N/A;  ? BREAST CYST ASPIRATION  1964  ? COLONOSCOPY    ? Dr. Earlean Shawl  ? CYSTOCELE REPAIR N/A 06/16/2016  ? Procedure: ANTERIOR REPAIR (CYSTOCELE);  Surgeon: Everett Graff, MD;  Location: [facility];  Service: Gynecology;  Laterality: N/A;  ? CYSTOSCOPY N/A 06/16/2016  ? Procedure: CYSTOSCOPY;  Surgeon: Everett Graff, MD;  Location: [facility];  Service: Gynecology;  Laterality: N/A;  see anterior repair  ?  Lower Extremity Venous Dopplers  01/09/2012  ? Right and left lower steroids: No evidence of thrombus or, thrombophlebitis; right and left GSV and SSV: No venous insufficiency. Normal exam.  ? NM MYOVIEW LTD  11/2016  ? 6.6 METS.  Reached 106% of max.  Heart rate.  Walk for 4:40 min.  EF 72%.  Normal blood pressure response.  upsloping ST segment depression, nonspecific.  Otherwise normal study.  LOW RISK.  No evidence of ischemia or infarction.  ? TRANSTHORACIC ECHOCARDIOGRAM  01/2018  ? EF > 65%. LV size small. Gr 1 DD (normal for age).   Normal atrial size. Moderate Aortic Sclerosis without Stenosis. Moderate Mitral Annular Calcification with mild MR & no MS  ? ?Patient Active Problem List  ? Diagnosis Date Noted  ? History of DVT of lower extremity 04/04/2021  ? Chronic kidney disease, stage 3a ([place]) 04/04/2021  ? AKI (acute kidney injury) ([place]) 04/04/2021  ? Unintentional weight loss 04/04/2021  ? Dyslipidemia, goal LDL below 100 04/12/2020  ? Palpitations 05/30/2018  ? Irregular heartbeat 03/02/2018  ? TIA (transient ischemic attack) 02/15/2018  ? Urinary, incontinence, stress female 06/16/2016  ? Right carotid bruit 12/23/2014  ? Exertional dyspnea 12/24/2013  ? Bilateral arm numbness  and tingling while sleeping - and shortly after waking 12/24/2013  ? Poor balance 12/24/2013  ? Edema of left lower extremity 12/21/2012  ? Essential hypertension   ? H/O hypercholesterolemia   ? Osteoporosis - of the spine 09/22/2011  ? ? ?REFERRING DIAG: G45.9 (ICD-10-CM) - Transient cerebral ischemic attack, unspecified  ? ?THERAPY DIAG:  ?Unsteadiness on feet ? ?Muscle weakness (generalized) ? ?Other abnormalities of gait and mobility ? ?PERTINENT HISTORY: 04/04/2021-TIA presented to [facility], per ED note:  "88 year old female presents with acute onset of aphasia was last for several hours.  Had associated diffuse weakness throughout her body.  Some headache but no facial droop.  Symptoms resolved spontaneously" ?  ?PMH:    HTN, hypercholestremia, LLE edema, spinal osteoporosis-not on fosamax, R carotid bruit, urinary stress incontinence, exertional dyspnea, AKI 04/04/2021, hx of DVT, irregular heartbeat ?  ?Pt known to clinic prior from June 2021 for dizziness post-COVID. (Saw Letta Moynahan) ? ?PRECAUTIONS: Fall ? ?SUBJECTIVE: Pt feels not like herself today due to eye appt yesterday where doctor injected medicine into eye because of fluid behind retina.  Pt feels like her memory is not as good sometimes. ? ?PAIN:  ?Are you having pain? No ? ?OBJECTIVE: ?TODAY'S TREATMENT:  ?Assessed FGA: ? [facility] PT Assessment - 05/11/21 1328   ? ?  ? Functional Gait  Assessment  ? Gait assessed  Yes   ? Gait Level Surface Walks 20 ft in less than 5.5 sec, no assistive devices, good speed, no evidence for imbalance, normal gait pattern, deviates no more than 6 in outside of the 12 in walkway width.   ? Change in Gait Speed Able to smoothly change walking speed without loss of balance or gait deviation. Deviate no more than 6 in outside of the 12 in walkway width.   ? Gait with Horizontal Head Turns Performs head turns smoothly with slight change in gait velocity (eg, minor disruption to smooth gait path), deviates 6-10 in outside 12 in walkway width, or uses an assistive device.   ? Gait with Vertical Head Turns Performs task with slight change in gait velocity (eg, minor disruption to smooth gait path), deviates 6 - 10 in outside 12 in walkway width or uses assistive device   ? Gait and Pivot Turn Pivot turns safely in greater than 3 sec and stops with no loss of balance, or pivot turns safely within 3 sec and stops with mild imbalance, requires small steps to catch balance.   ? Step Over Obstacle Is able to step over one shoe box (4.5 in total height) without changing gait speed. No evidence of imbalance.   ? Gait with Narrow Base of Support Ambulates less than 4 steps heel to toe or cannot perform without assistance.   ? Gait with Eyes Closed Walks 20 ft,  uses assistive device, slower speed, mild gait deviations, deviates 6-10 in outside 12 in walkway width. Ambulates 20 ft in less than 9 sec but greater than 7 sec.   ? Ambulating Backwards Walks 20 ft, uses assistive device, slower speed, mild gait deviations, deviates 6-10 in outside 12 in walkway width.   ? Steps Alternating feet, must use rail.   ? Total Score 20   ? FGA comment: Medium fall risk   ? ?  ?  ? ?  ? ?Assessed BP following FGA due to pt reported dizziness:  138/72 ?Initiated HEP.  See below for details. ?Pt asymptomatic rest of session, but requires rest due to fatigue. ?  ?  ?  PATIENT EDUCATION: ?Education details: FGA results and goal set as well as HEP initiated with emphasis on safety with setup due to having no available help or supervision at home as husband is unable to provide physical assistance. ?Person educated: Patient ?Education method: Explanation ?Education comprehension: verbalized understanding and needs further education ?  ?  ?HOME EXERCISE PROGRAM: ?Access Code: [email or web address] ?URL: [email or web address] ?Date: 05/11/2021 ?Prepared by: Elease Etienne ? ?Exercises ?- Tandem Stance with Eyes Closed in Corner  - 1 x daily - 5 x weekly - 1 sets - 2 reps - 30 seconds hold ?- Standing Near Stance in [place] with Eyes Closed  - 1 x daily - 5 x weekly - 1 sets - 2 reps - 30 seconds hold ?- Tandem Stance in Corner  - 1 x daily - 5 x weekly - 1 sets - 2 reps - 30 seconds hold ?- Heel Raises with Counter Support  - 1 x daily - 5 x weekly - 2 sets - 10 reps ?- Standing [place] with Counter Support  - 1 x daily - 5 x weekly - 2 sets - 20 reps ?- Tandem Walking with Counter Support  - 1 x daily - 5 x weekly - 3 sets - 10 reps ? *Edu to use back of chair in front during corner balance exercises for safety using fingertip touch as needed. ?  ?  ?GOALS: ?Goals reviewed with patient? Yes ?  ?SHORT TERM GOALS: Target date:  06/04/2021 ?  ?Pt will be independent with initial strength and  balance HEP. ?Baseline:  To be established. ?Goal status: INITIAL ?  ?2.  Pt will demonstrate TUG of <13 seconds in order to decrease risk of falls and improve functional mobility using LRAD. ?Baseline: 13

## 2021-05-23 NOTE — Progress Notes (Signed)
? ? ?PATIENT: [name] ?DOB: [birth year] ? ?REASON FOR VISIT: follow up ?HISTORY FROM: patient ?PRIMARY NEUROLOGIST: Dr. Leonie Man ? ?HISTORY OF PRESENT ILLNESS: ?Today 05/23/21: ? ?[name] is a 88 year old female with a history of possible TIA versus anxiety.  She returns today for follow-up. Denies any additional symptoms since March. Lives at home with her husband. Son visits often and checks on them.  ? ?She does feel like she is an anxious person and she gets easily upset. Has mentioned this to her PCP but not on any medication.  ? ?Recently took a trip to [place] to see the [employer] and [employer].  Reports that the trip went very well and she enjoyed herself. ? ? ?HISTORY ( copied from Dr. Xu)88 year old female with history of hypertension, hyperlipidemia, DVT, TIA versus syncope admitted again for transient aphasia.  Patient stated that yesterday she dinner and felt lightheadedness, she sat down for dinner, shortly after eating she had difficulty getting words out when her son talk to her.  In route to ED, her symptoms resolved. ?  ?Of note, in 01/2018 patient also had transient aphasia after a syncope event.  At that time she felt dizzy and passed out.  She was considered TIA versus syncope.  MRI no acute abnormality.  Old right cerebellum and left caudate lacunar infarct.  MRA left A2/A3 moderate stenosis.  Carotid Doppler negative.  LDL 113, A1c 5.8.  She was discharged on aspirin 81 and Lipitor 20. ?  ?This admission CT negative.  CT head and neck unremarkable.  MRI no acute infarct.  EF 60 to 65%, EEG normal.  LDL 87, A1c 5.7.  Creatinine elevated from her baseline 1.64-1.80. ?  ?On exam, patient awake alert, follows simple commands, no aphasia, orientated x3.  Neuro exam intact.  Etiology for patient's symptoms again concerning for presyncope versus TIA.  Anxiety also possible factor.  However, would like to treat as TIA.  Continue aspirin 81 and Plavix DAPT for 3 weeks and then as alone.   Continue Zetia and increase pravastatin from 20-40.  PT and OT recommend outpatient PT. ?   ?  ?TIA v anxiety ?Code Stroke CT head: No acute abnormality. ASPECTS 10.  ?CTA head & neck: No large vessel occlusion or proximal hemodynamically significant stenosis in the head or neck. ?MRI: There is no evidence of acute intracranial hemorrhage, extra-axial fluid collection, or acute infarct ?2D Echo: EF 60-65%, G2DD.  ?LDL 87 (04/05/2021) ?HgbA1c 5.7 (04/05/2021) ?EEG: WNL, No seizures or epileptiform discharges were seen throughout the recording ?VTE prophylaxis -  heparin injection 5,000 Units ? ?REVIEW OF SYSTEMS: Out of a complete 14 system review of symptoms, the patient complains only of the following symptoms, and all other reviewed systems are negative. ? ?ALLERGIES: ?Allergies  ?Allergen Reactions  ? Atorvastatin Other (See Comments)  ? Desmopressin Acetate Other (See Comments)  ? Iodinated Contrast Media Hives and Other (See Comments)  ? Myrbetriq [Mirabegron] Other (See Comments)  ?  "Makes me urinate more frequently"  ? Iodine Rash  ? ? ?HOME MEDICATIONS: ?Outpatient Medications Prior to Visit  ?Medication Sig Dispense Refill  ? acetaminophen (TYLENOL) 500 MG tablet Take 500-1,000 mg by mouth every 8 (eight) hours as needed for mild pain.     ? albuterol (VENTOLIN HFA) 108 (90 Base) MCG/ACT inhaler Inhale 2 puffs into the lungs every 4 (four) hours as needed for wheezing or shortness of breath.     ? aspirin EC 81 MG EC tablet  Take 1 tablet (81 mg total) by mouth daily. Swallow whole. 30 tablet 11  ? Cholecalciferol (VITAMIN D-3) 25 MCG (1000 UT) CAPS Take 1,000 Units by mouth daily with breakfast.     ? ezetimibe (ZETIA) 10 MG tablet Take 10 mg by mouth daily.    ? Lactobacillus Rhamnosus, GG, (CULTURELLE) CAPS Take 1 capsule by mouth every morning.     ? Magnesium 250 MG TABS Take 250 mg by mouth daily as needed (leg cramps).    ? metoprolol tartrate (LOPRESSOR) 25 MG tablet Take 0.5 tablets by mouth 2 (two)  times daily.    ? Multiple Vitamins-Minerals (AIRBORNE PO) Take 1 tablet by mouth daily with breakfast.    ? Multiple Vitamins-Minerals (CENTRUM SILVER ULTRA WOMENS) TABS Take 1 tablet by mouth daily with breakfast.     ? pravastatin (PRAVACHOL) 20 MG tablet Take 2 tablets (40 mg total) by mouth daily. 30 tablet 2  ? Saccharomyces boulardii (PROBIOTIC) 250 MG CAPS Take by mouth daily. Pt isn't sure of the dosage    ? Trospium Chloride 60 MG CP24 Take 1 capsule by mouth daily.    ? vitamin B-12 (CYANOCOBALAMIN) 100 MCG tablet Take 100 mcg by mouth daily.    ? ?No facility-administered medications prior to visit.  ? ? ?PAST MEDICAL HISTORY: ?Past Medical History:  ?Diagnosis Date  ? Abnormal Pap smear 1975-76  ? Acute deep vein thrombosis (DVT) of left peroneal vein (HCC)   ? Anemia   ? history of  ? Arthritis   ? spine  ? Breast cyst, left 1980  ? Cystocele 2012  ? Diverticulosis   ? with h/o Diverticulitis  ? DVT, lower extremity, distal, chronic ([place]) 12/24/2018  ? GERD (gastroesophageal reflux disease)   ? H/O hypercholesterolemia   ? H/O osteopenia   ? H/O varicella   ? H/O varicose veins   ? Hx of Mumps   ? Macular degeneration   ? Seasonal allergies   ? Urge incontinence 2012  ? Urinary frequency 2010  ? ? ?PAST SURGICAL HISTORY: ?Past Surgical History:  ?Procedure Laterality Date  ? ABDOMINAL HYSTERECTOMY    ? BLADDER SUSPENSION N/A 06/16/2016  ? Procedure: TRANSVAGINAL TAPE (TVT) PROCEDURE;  Surgeon: Everett Graff, MD;  Location: [facility];  Service: Gynecology;  Laterality: N/A;  ? BREAST CYST ASPIRATION  1964  ? COLONOSCOPY    ? Dr. Earlean Shawl  ? CYSTOCELE REPAIR N/A 06/16/2016  ? Procedure: ANTERIOR REPAIR (CYSTOCELE);  Surgeon: Everett Graff, MD;  Location: [facility];  Service: Gynecology;  Laterality: N/A;  ? CYSTOSCOPY N/A 06/16/2016  ? Procedure: CYSTOSCOPY;  Surgeon: Everett Graff, MD;  Location: [facility];  Service: Gynecology;  Laterality: N/A;  see anterior repair  ? Lower Extremity Venous Dopplers   01/09/2012  ? Right and left lower steroids: No evidence of thrombus or, thrombophlebitis; right and left GSV and SSV: No venous insufficiency. Normal exam.  ? NM MYOVIEW LTD  11/2016  ? 6.6 METS.  Reached 106% of max.  Heart rate.  Walk for 4:40 min.  EF 72%.  Normal blood pressure response.  upsloping ST segment depression, nonspecific.  Otherwise normal study.  LOW RISK.  No evidence of ischemia or infarction.  ? TRANSTHORACIC ECHOCARDIOGRAM  01/2018  ? EF > 65%. LV size small. Gr 1 DD (normal for age).   Normal atrial size. Moderate Aortic Sclerosis without Stenosis. Moderate Mitral Annular Calcification with mild MR & no MS  ? ? ?FAMILY HISTORY: ?Family History  ?Problem Relation  Age of Onset  ? Uterine cancer Mother 56  ? Heart attack Brother 78  ? Breast cancer Daughter   ? ? ?SOCIAL HISTORY: ?Social History  ? ?Socioeconomic History  ? Marital status: Married  ?  Spouse name: Not on file  ? Number of children: Not on file  ? Years of education: Not on file  ? Highest education level: Not on file  ?Occupational History  ? Not on file  ?Tobacco Use  ? Smoking status: Never  ? Smokeless tobacco: Never  ?Vaping Use  ? Vaping Use: Never used  ?Substance and Sexual Activity  ? Alcohol use: No  ? Drug use: No  ? Sexual activity: Yes  ?  Birth control/protection: Surgical  ?  Comment: Hysterectomy  ?Other Topics Concern  ? Not on file  ?Social History Narrative  ? She is a married mother of 66, grandmother of 15, great grandmother of 1. She walks  ? daily about 2 miles a day, does not smoke, does not drink.   ? ?Social Determinants of Health  ? ?Financial Resource Strain: Not on file  ?Food Insecurity: Not on file  ?Transportation Needs: Not on file  ?Physical Activity: Not on file  ?Stress: Not on file  ?Social Connections: Not on file  ?Intimate Partner Violence: Not on file  ? ? ? ? ?PHYSICAL EXAM ? ?Vitals:  ? 05/24/21 0904  ?BP: (!) 165/73  ?Pulse: 62  ?Weight: 127 lb (57.6 kg)  ?Height: '5\' 3"'$  (1.6 m)  ? ?Body  mass index is 22.5 kg/m?. ? ?Generalized: Well developed, in no acute distress  ? ?Neurological examination  ?Mentation: Alert oriented to time, place, history taking. Follows all commands speech and language flu

## 2021-05-24 ENCOUNTER — Encounter: Payer: Self-pay | Admitting: Adult Health

## 2021-05-24 ENCOUNTER — Ambulatory Visit: Payer: Medicare PPO | Admitting: Adult Health

## 2021-05-24 VITALS — BP 165/84 | HR 66 | Ht 63.0 in | Wt 127.0 lb

## 2021-05-24 DIAGNOSIS — G459 Transient cerebral ischemic attack, unspecified: Secondary | ICD-10-CM

## 2021-05-24 NOTE — Progress Notes (Signed)
I agree with the above plan 

## 2021-05-24 NOTE — Patient Instructions (Addendum)
Your Plan: ? ?- Continue ASA 81 mg daily ?- BP goal keep in normal range around 130/90. Avoid BP being too low ?- Cholesterol LDL goal <70 continue zetia, pravastatin  ?- maintain healthy diet and exercise ?- Discuss anxiety with PCP ?If your symptoms worsen or you develop new symptoms please let us know.  ? ? ?Thank you for coming to see [place] at [facility]. I hope we have been able to provide you high quality care today. ? ?You may receive a patient satisfaction survey over the next few weeks. We would appreciate your feedback and comments so that we may continue to improve ourselves and the health of our patients. ? ?

## 2021-05-25 ENCOUNTER — Telehealth: Payer: Self-pay | Admitting: Physical Therapy

## 2021-05-25 ENCOUNTER — Ambulatory Visit: Payer: Medicare PPO | Admitting: Physical Therapy

## 2021-05-25 ENCOUNTER — Encounter: Payer: Self-pay | Admitting: Physical Therapy

## 2021-05-25 VITALS — BP 154/76 | HR 65

## 2021-05-25 DIAGNOSIS — R2681 Unsteadiness on feet: Secondary | ICD-10-CM

## 2021-05-25 DIAGNOSIS — R2689 Other abnormalities of gait and mobility: Secondary | ICD-10-CM | POA: Insufficient documentation

## 2021-05-25 DIAGNOSIS — M6281 Muscle weakness (generalized): Secondary | ICD-10-CM

## 2021-05-25 NOTE — Therapy (Signed)
?OUTPATIENT PHYSICAL THERAPY TREATMENT NOTE-ARRIVED NO CHARGE ? ? ?Patient Name: [name] ?MRN: [id number] ?DOB:09-29-1927, 88 y.o., female ?Today's Date: 05/25/2021 ? ?PCP: Lavone Orn, MD ?REFERRING PROVIDER: Lavone Orn, MD ? ?END OF SESSION:  ? PT End of Session - 05/25/21 1157   ? ? Visit Number 2   ? Number of Visits 9   8+eval  ? Date for PT Re-Evaluation 07/02/21   ? Authorization Type HUMANA MEDICARE CHOICE PPO   ? Progress Note Due on Visit 10   ? PT Start Time 1151   ? PT Stop Time 1210   ? PT Time Calculation (min) 19 min   ? Activity Tolerance Patient tolerated treatment well;Treatment limited secondary to medical complications (Comment)   Pt short of breath, BP elevated, RLE heavy  ? Behavior During Therapy Anxious   ? ?  ?  ? ?  ? ? ?Past Medical History:  ?Diagnosis Date  ? Abnormal Pap smear 1975-76  ? Acute deep vein thrombosis (DVT) of left peroneal vein (HCC)   ? Anemia   ? history of  ? Arthritis   ? spine  ? Breast cyst, left 1980  ? Cystocele 2012  ? Diverticulosis   ? with h/o Diverticulitis  ? DVT, lower extremity, distal, chronic ([place]) 12/24/2018  ? GERD (gastroesophageal reflux disease)   ? H/O hypercholesterolemia   ? H/O osteopenia   ? H/O varicella   ? H/O varicose veins   ? Hx of Mumps   ? Macular degeneration   ? Seasonal allergies   ? Urge incontinence 2012  ? Urinary frequency 2010  ? ?Past Surgical History:  ?Procedure Laterality Date  ? ABDOMINAL HYSTERECTOMY    ? BLADDER SUSPENSION N/A 06/16/2016  ? Procedure: TRANSVAGINAL TAPE (TVT) PROCEDURE;  Surgeon: Everett Graff, MD;  Location: [facility];  Service: Gynecology;  Laterality: N/A;  ? BREAST CYST ASPIRATION  1964  ? COLONOSCOPY    ? Dr. Earlean Shawl  ? CYSTOCELE REPAIR N/A 06/16/2016  ? Procedure: ANTERIOR REPAIR (CYSTOCELE);  Surgeon: Everett Graff, MD;  Location: [facility];  Service: Gynecology;  Laterality: N/A;  ? CYSTOSCOPY N/A 06/16/2016  ? Procedure: CYSTOSCOPY;  Surgeon: Everett Graff, MD;  Location: [facility];  Service:  Gynecology;  Laterality: N/A;  see anterior repair  ? Lower Extremity Venous Dopplers  01/09/2012  ? Right and left lower steroids: No evidence of thrombus or, thrombophlebitis; right and left GSV and SSV: No venous insufficiency. Normal exam.  ? NM MYOVIEW LTD  11/2016  ? 6.6 METS.  Reached 106% of max.  Heart rate.  Walk for 4:40 min.  EF 72%.  Normal blood pressure response.  upsloping ST segment depression, nonspecific.  Otherwise normal study.  LOW RISK.  No evidence of ischemia or infarction.  ? TRANSTHORACIC ECHOCARDIOGRAM  01/2018  ? EF > 65%. LV size small. Gr 1 DD (normal for age).   Normal atrial size. Moderate Aortic Sclerosis without Stenosis. Moderate Mitral Annular Calcification with mild MR & no MS  ? ?Patient Active Problem List  ? Diagnosis Date Noted  ? History of DVT of lower extremity 04/04/2021  ? Chronic kidney disease, stage 3a ([place]) 04/04/2021  ? AKI (acute kidney injury) ([place]) 04/04/2021  ? Unintentional weight loss 04/04/2021  ? Dyslipidemia, goal LDL below 100 04/12/2020  ? Palpitations 05/30/2018  ? Irregular heartbeat 03/02/2018  ? TIA (transient ischemic attack) 02/15/2018  ? Urinary, incontinence, stress female 06/16/2016  ? Right carotid bruit 12/23/2014  ? Exertional dyspnea 12/24/2013  ?  Bilateral arm numbness and tingling while sleeping - and shortly after waking 12/24/2013  ? Poor balance 12/24/2013  ? Edema of left lower extremity 12/21/2012  ? Essential hypertension   ? H/O hypercholesterolemia   ? Osteoporosis - of the spine 09/22/2011  ? ? ?REFERRING DIAG: G45.9 (ICD-10-CM) - Transient cerebral ischemic attack, unspecified  ? ?THERAPY DIAG:  ?Unsteadiness on feet ? ?Muscle weakness (generalized) ? ?Other abnormalities of gait and mobility ? ?PERTINENT HISTORY: 04/04/2021-TIA presented to [facility], per ED note:  "88 year old female presents with acute onset of aphasia was last for several hours.  Had associated diffuse weakness throughout her body.  Some headache but no  facial droop.  Symptoms resolved spontaneously" ?  ?PMH:   HTN, hypercholestremia, LLE edema, spinal osteoporosis-not on fosamax, R carotid bruit, urinary stress incontinence, exertional dyspnea, AKI 04/04/2021, hx of DVT, irregular heartbeat ?  ?Pt known to clinic prior from June 2021 for dizziness post-COVID. (Saw Letta Moynahan) ? ?PRECAUTIONS: Fall ? ?SUBJECTIVE: Pt comes in complaining of shortness of breath, dyspneic upon standing in lobby.  Reports head hurts all over.  Her LLE feels heavy today. ? ?PAIN:  ?Are you having pain? No ? ?OBJECTIVE: ?TODAY'S TREATMENT:  ?Assessed vitals on LUE: ?Today's Vitals  ? 05/25/21 1156  ?BP: (!) 154/76  ?Pulse: 65  ?SpO2: 99%  ? ?PT held today as pt dyspneic upon standing in lobby and ambulating 10'.  Requiring standing rest during ambulation to mat table with appearance of labored breathing.  Pt states she almost canceled appt this morning due to feeling poorly, but was scared and wanted to get PT opinion.  Pt drove herself to appt without report of incident.  After assessed vitals, brief MMT performed to LE w/o discrepancy noted from eval.  Grip strength symmetrical.  No noted facial droop, slurred speech, or reports of difficulty swallowing.  Pt states her chest does not hurt, but that she is having a fluttering feeling in her chest.  She states that she thinks she needs to go home and sleep.  PT advises against this due to elevated BP, reports of headache in combination with chest feeling and labored breathing.  PT advises follow-up with ED/urgent care, upon pt refusal, follow-up recommended with PCP with edu that PT will reach out to PCP via In-Basket.  Requested pt to call son to make him aware of situation and ask that he pick her up so she does not drive in this condition.  Pt calls son, PT provides edu (see below) with son and pt in agreement to allow pt to drive home. ?  ?PATIENT EDUCATION: ?Education details: Edu on s/s of stroke and MI.  Spoke to son via pt's cell phone  with same edu and instructions to follow up with PCP as pt refused hospital ED and urgent care options.   ?Person educated: Patient; Son ?Education method: Explanation ?Education comprehension: verbalized understanding and needs further education ?  ? ? ?Bary Richard, PT, DPT ?05/25/2021, 12:56 PM ? ?  ? ?

## 2021-05-25 NOTE — Telephone Encounter (Signed)
In-basket message to [name] to establish follow-up regarding pt status with PT today. ? ?Elease Etienne, PT, DPT ? ?

## 2021-05-31 ENCOUNTER — Ambulatory Visit: Payer: Medicare PPO | Attending: Internal Medicine | Admitting: Physical Therapy

## 2021-05-31 ENCOUNTER — Encounter: Payer: Self-pay | Admitting: Physical Therapy

## 2021-05-31 VITALS — BP 137/68 | HR 63

## 2021-05-31 DIAGNOSIS — M6281 Muscle weakness (generalized): Secondary | ICD-10-CM | POA: Diagnosis not present

## 2021-05-31 DIAGNOSIS — R2689 Other abnormalities of gait and mobility: Secondary | ICD-10-CM

## 2021-05-31 DIAGNOSIS — R2681 Unsteadiness on feet: Secondary | ICD-10-CM | POA: Diagnosis not present

## 2021-05-31 NOTE — Patient Instructions (Signed)
Access Code: [email or web address] ?URL: [email or web address] ?Date: 05/31/2021 ?Prepared by: Elease Etienne ? ?Exercises ?- Tandem Stance with Eyes Closed in Corner  - 1 x daily - 5 x weekly - 1 sets - 2 reps - 30 seconds hold ?- Standing Near Stance in [place] with Eyes Closed  - 1 x daily - 5 x weekly - 1 sets - 2 reps - 30 seconds hold ?- Tandem Stance in Corner  - 1 x daily - 5 x weekly - 1 sets - 2 reps - 30 seconds hold ?- Heel Raises with Counter Support  - 1 x daily - 5 x weekly - 2 sets - 10 reps ?- Standing March with Counter Support  - 1 x daily - 5 x weekly - 2 sets - 20 reps ?- Tandem Walking with Counter Support  - 1 x daily - 5 x weekly - 3 sets - 10 reps ?- Side Stepping with Resistance at Thighs and Counter Support  - 1 x daily - 5 x weekly - 3 sets - 10 reps ?

## 2021-05-31 NOTE — Therapy (Signed)
?OUTPATIENT PHYSICAL THERAPY TREATMENT NOTE ? ? ?Patient Name: [name] ?MRN: [id number] ?DOB:Apr 17, 1927, 88 y.o., female ?Today's Date: 05/31/2021 ? ?PCP: Lavone Orn, MD ?REFERRING PROVIDER: Lavone Orn, MD ? ?END OF SESSION:  ? PT End of Session - 05/31/21 1156   ? ? Visit Number 3   ? Number of Visits 9   8+eval  ? Date for PT Re-Evaluation 07/02/21   ? Authorization Type [employer] MEDICARE CHOICE PPO   ? Progress Note Due on Visit 10   ? PT Start Time 1148   ? PT Stop Time 1228   ? PT Time Calculation (min) 40 min   ? Equipment Utilized During Treatment Gait belt   ? Activity Tolerance Patient tolerated treatment well;Treatment limited secondary to medical complications (Comment)   ? Behavior During Therapy [facility] for tasks assessed/performed   ? ?  ?  ? ?  ? ? ?Past Medical History:  ?Diagnosis Date  ? Abnormal Pap smear 1975-76  ? Acute deep vein thrombosis (DVT) of left peroneal vein (HCC)   ? Anemia   ? history of  ? Arthritis   ? spine  ? Breast cyst, left 1980  ? Cystocele 2012  ? Diverticulosis   ? with h/o Diverticulitis  ? DVT, lower extremity, distal, chronic ([place]) 12/24/2018  ? GERD (gastroesophageal reflux disease)   ? H/O hypercholesterolemia   ? H/O osteopenia   ? H/O varicella   ? H/O varicose veins   ? Hx of Mumps   ? Macular degeneration   ? Seasonal allergies   ? Urge incontinence 2012  ? Urinary frequency 2010  ? ?Past Surgical History:  ?Procedure Laterality Date  ? ABDOMINAL HYSTERECTOMY    ? BLADDER SUSPENSION N/A 06/16/2016  ? Procedure: TRANSVAGINAL TAPE (TVT) PROCEDURE;  Surgeon: Everett Graff, MD;  Location: [facility];  Service: Gynecology;  Laterality: N/A;  ? BREAST CYST ASPIRATION  1964  ? COLONOSCOPY    ? Dr. Earlean Shawl  ? CYSTOCELE REPAIR N/A 06/16/2016  ? Procedure: ANTERIOR REPAIR (CYSTOCELE);  Surgeon: Everett Graff, MD;  Location: [facility];  Service: Gynecology;  Laterality: N/A;  ? CYSTOSCOPY N/A 06/16/2016  ? Procedure: CYSTOSCOPY;  Surgeon: Everett Graff, MD;  Location: [facility];   Service: Gynecology;  Laterality: N/A;  see anterior repair  ? Lower Extremity Venous Dopplers  01/09/2012  ? Right and left lower steroids: No evidence of thrombus or, thrombophlebitis; right and left GSV and SSV: No venous insufficiency. Normal exam.  ? NM MYOVIEW LTD  11/2016  ? 6.6 METS.  Reached 106% of max.  Heart rate.  Walk for 4:40 min.  EF 72%.  Normal blood pressure response.  upsloping ST segment depression, nonspecific.  Otherwise normal study.  LOW RISK.  No evidence of ischemia or infarction.  ? TRANSTHORACIC ECHOCARDIOGRAM  01/2018  ? EF > 65%. LV size small. Gr 1 DD (normal for age).   Normal atrial size. Moderate Aortic Sclerosis without Stenosis. Moderate Mitral Annular Calcification with mild MR & no MS  ? ?Patient Active Problem List  ? Diagnosis Date Noted  ? History of DVT of lower extremity 04/04/2021  ? Chronic kidney disease, stage 3a ([place]) 04/04/2021  ? AKI (acute kidney injury) ([place]) 04/04/2021  ? Unintentional weight loss 04/04/2021  ? Dyslipidemia, goal LDL below 100 04/12/2020  ? Palpitations 05/30/2018  ? Irregular heartbeat 03/02/2018  ? TIA (transient ischemic attack) 02/15/2018  ? Urinary, incontinence, stress female 06/16/2016  ? Right carotid bruit 12/23/2014  ? Exertional dyspnea  12/24/2013  ? Bilateral arm numbness and tingling while sleeping - and shortly after waking 12/24/2013  ? Poor balance 12/24/2013  ? Edema of left lower extremity 12/21/2012  ? Essential hypertension   ? H/O hypercholesterolemia   ? Osteoporosis - of the spine 09/22/2011  ? ? ?REFERRING DIAG: G45.9 (ICD-10-CM) - Transient cerebral ischemic attack, unspecified  ? ?THERAPY DIAG:  ?Unsteadiness on feet ? ?Muscle weakness (generalized) ? ?Other abnormalities of gait and mobility ? ?PERTINENT HISTORY: 04/04/2021-TIA presented to [facility], per ED note:  "88 year old female presents with acute onset of aphasia was last for several hours.  Had associated diffuse weakness throughout her body.  Some headache  but no facial droop.  Symptoms resolved spontaneously" ?  ?PMH:   HTN, hypercholestremia, LLE edema, spinal osteoporosis-not on fosamax, R carotid bruit, urinary stress incontinence, exertional dyspnea, AKI 04/04/2021, hx of DVT, irregular heartbeat ?  ?Pt known to clinic prior from June 2021 for dizziness post-COVID. (Saw Letta Moynahan) ? ?PRECAUTIONS: Fall ? ?SUBJECTIVE: Pt states she did balance HEP in corner, but is still struggling with heel-toe walking.  She is feeling much improved from last session.  MD reached out to pt, she declined to go into office stating she has a visit upcoming.  She states she went home and slept after last visit with son and husband checking in on her.  She wonders if she had an anxiety attack. ? ?PAIN:  ?Are you having pain? No ? ?OBJECTIVE: ?TODAY'S TREATMENT:  ?LUE in sitting at onset of session: ?Today's Vitals  ? 05/31/21 1153  ?BP: 137/68  ?Pulse: 63  ?Dynamic warmup on SciFit at L2.0x68mns BLE only to focus on strength and reciprocal stepping.  Total steps 505. ?Pt requires rest x592m following SciFit due to "heaviness" in BLEs. ?Pt ambulates 345' performing head turns and nods w/ LOB x1 w/ pt requiring minA to recover.  Balance improved w/ cuing to dec pace of head movements. ?Pt ambulates backwards x78' using CGA and lateral step to recover LOB x1, able to increase pace with inc distance ?Side-stepping with red theraband 6x8' countertop - added to HEP ? ? ?PATIENT EDUCATION: ?Education details: Continue HEP, addition to HEP. ?Person educated: Patient ?Education method: Explanation ?Education comprehension: verbalized understanding and needs further education ?  ?  ?HOME EXERCISE PROGRAM: ?Access Code: [email or web address] ?URL: [email or web address] ?Date: 05/11/2021 ?Prepared by: MaElease Etienne ?Exercises ?- Tandem Stance with Eyes Closed in Corner  - 1 x daily - 5 x weekly - 1 sets - 2 reps - 30 seconds hold ?- Standing Near Stance in [place] Eyes Closed  - 1 x daily -  5 x weekly - 1 sets - 2 reps - 30 seconds hold ?- Tandem Stance in Corner  - 1 x daily - 5 x weekly - 1 sets - 2 reps - 30 seconds hold ?- Heel Raises with Counter Support  - 1 x daily - 5 x weekly - 2 sets - 10 reps ?- Standing March with Counter Support  - 1 x daily - 5 x weekly - 2 sets - 20 reps ?- Tandem Walking with Counter Support  - 1 x daily - 5 x weekly - 3 sets - 10 reps ? *Edu to use back of chair in front during corner balance exercises for safety using fingertip touch as needed. ? - Side Stepping with Resistance at Thighs and Counter Support  - 1 x daily - 5 x weekly - 3 sets - 10 reps ?  ?  GOALS: ?Goals reviewed with patient? Yes ?  ?SHORT TERM GOALS: Target date:  06/04/2021 ?  ?Pt will be independent with initial strength and balance HEP. ?Baseline:  To be established. ?Goal status: INITIAL ?  ?2.  Pt will demonstrate TUG of <13 seconds in order to decrease risk of falls and improve functional mobility using LRAD. ?Baseline: 13.64 sec ?Goal status: INITIAL ?  ?3.  Pt will demonstrate a gait speed of >3.33 feet/sec in order to decrease risk for falls. ?Baseline: 3.06 ft/sec ?Goal status: INITIAL ?  ?4.  DGI to be assessed with LTG set as appropriate. ?Baseline: FGA assessed due to pt functional status. ?Goal status: Revised ?  ?  ?LONG TERM GOALS: Target date:  07/02/2021 ?  ?Pt will be independent with finalized strength and balance HEP. ?Baseline: Not established. ?Goal status: INITIAL ?  ?2.  Pt will ambulate >750 feet supervision level w/o AD and no overt LOB to promote safe return to prior level of ?Baseline: 81' w/o AD supervision level ?Goal status: INITIAL ?  ?3.  Pt will decrease 5xSTS to <13 seconds in order to demonstrate decreased risk for falls and improved functional bilateral LE strength and power. ?Baseline: 16.28sec, pt winded following ?Goal status: INITIAL ?  ?4.  Pt will improve FGA score to >/=25/30 in order to demonstrate improved balance and decreased fall risk. ?Baseline:  20/30 ?Goal status: INITIAL ?  ?ASSESSMENT: ?  ?CLINICAL IMPRESSION: ?Pt feeling much improved from prior visit with BP within better limits this visit.  Focused on dynamic balance tasks during ambulation a

## 2021-06-01 ENCOUNTER — Other Ambulatory Visit: Payer: Self-pay

## 2021-06-01 ENCOUNTER — Emergency Department (HOSPITAL_BASED_OUTPATIENT_CLINIC_OR_DEPARTMENT_OTHER): Payer: Medicare PPO

## 2021-06-01 ENCOUNTER — Emergency Department (HOSPITAL_COMMUNITY): Payer: Medicare PPO

## 2021-06-01 ENCOUNTER — Emergency Department (HOSPITAL_COMMUNITY)
Admission: EM | Admit: 2021-06-01 | Discharge: 2021-06-01 | Disposition: A | Discharge status: Home / Self Care | Payer: Medicare PPO | Attending: Emergency Medicine | Admitting: Emergency Medicine

## 2021-06-01 DIAGNOSIS — R471 Dysarthria and anarthria: Secondary | ICD-10-CM | POA: Diagnosis not present

## 2021-06-01 DIAGNOSIS — Z86718 Personal history of other venous thrombosis and embolism: Secondary | ICD-10-CM | POA: Insufficient documentation

## 2021-06-01 DIAGNOSIS — R001 Bradycardia, unspecified: Secondary | ICD-10-CM | POA: Diagnosis not present

## 2021-06-01 DIAGNOSIS — G319 Degenerative disease of nervous system, unspecified: Secondary | ICD-10-CM | POA: Diagnosis not present

## 2021-06-01 DIAGNOSIS — M6281 Muscle weakness (generalized): Secondary | ICD-10-CM | POA: Insufficient documentation

## 2021-06-01 DIAGNOSIS — R200 Anesthesia of skin: Secondary | ICD-10-CM | POA: Diagnosis not present

## 2021-06-01 DIAGNOSIS — Z79899 Other long term (current) drug therapy: Secondary | ICD-10-CM | POA: Insufficient documentation

## 2021-06-01 DIAGNOSIS — R531 Weakness: Secondary | ICD-10-CM | POA: Diagnosis not present

## 2021-06-01 DIAGNOSIS — R29898 Other symptoms and signs involving the musculoskeletal system: Secondary | ICD-10-CM

## 2021-06-01 DIAGNOSIS — I10 Essential (primary) hypertension: Secondary | ICD-10-CM | POA: Diagnosis not present

## 2021-06-01 DIAGNOSIS — G8190 Hemiplegia, unspecified affecting unspecified side: Secondary | ICD-10-CM | POA: Diagnosis not present

## 2021-06-01 DIAGNOSIS — Z7982 Long term (current) use of aspirin: Secondary | ICD-10-CM | POA: Diagnosis not present

## 2021-06-01 DIAGNOSIS — M25472 Effusion, left ankle: Secondary | ICD-10-CM | POA: Diagnosis not present

## 2021-06-01 DIAGNOSIS — I69928 Other speech and language deficits following unspecified cerebrovascular disease: Secondary | ICD-10-CM | POA: Diagnosis not present

## 2021-06-01 DIAGNOSIS — E785 Hyperlipidemia, unspecified: Secondary | ICD-10-CM | POA: Insufficient documentation

## 2021-06-01 DIAGNOSIS — D649 Anemia, unspecified: Secondary | ICD-10-CM | POA: Diagnosis not present

## 2021-06-01 DIAGNOSIS — M7989 Other specified soft tissue disorders: Secondary | ICD-10-CM

## 2021-06-01 DIAGNOSIS — M79605 Pain in left leg: Secondary | ICD-10-CM | POA: Diagnosis not present

## 2021-06-01 LAB — CBC WITH DIFFERENTIAL/PLATELET
Abs Immature Granulocytes: 0.02 10*3/uL (ref 0.00–0.07)
Basophils Absolute: 0 10*3/uL (ref 0.0–0.1)
Basophils Relative: 1 %
Eosinophils Absolute: 0.1 10*3/uL (ref 0.0–0.5)
Eosinophils Relative: 1 %
HCT: 32.2 % — ABNORMAL LOW (ref 36.0–46.0)
Hemoglobin: 11.4 g/dL — ABNORMAL LOW (ref 12.0–15.0)
Immature Granulocytes: 0 %
Lymphocytes Relative: 30 %
Lymphs Abs: 2.4 10*3/uL (ref 0.7–4.0)
MCH: 30.7 pg (ref 26.0–34.0)
MCHC: 35.4 g/dL (ref 30.0–36.0)
MCV: 86.8 fL (ref 80.0–100.0)
Monocytes Absolute: 0.6 10*3/uL (ref 0.1–1.0)
Monocytes Relative: 8 %
Neutro Abs: 4.7 10*3/uL (ref 1.7–7.7)
Neutrophils Relative %: 60 %
Platelets: 202 10*3/uL (ref 150–400)
RBC: 3.71 MIL/uL — ABNORMAL LOW (ref 3.87–5.11)
RDW: 13.9 % (ref 11.5–15.5)
WBC: 7.8 10*3/uL (ref 4.0–10.5)
nRBC: 0 % (ref 0.0–0.2)

## 2021-06-01 LAB — BASIC METABOLIC PANEL
Anion gap: 6 (ref 5–15)
BUN: 21 mg/dL (ref 8–23)
CO2: 25 mmol/L (ref 22–32)
Calcium: 9.4 mg/dL (ref 8.9–10.3)
Chloride: 110 mmol/L (ref 98–111)
Creatinine, Ser: 1.13 mg/dL — ABNORMAL HIGH (ref 0.44–1.00)
GFR, Estimated: 46 mL/min — ABNORMAL LOW (ref >60)
Glucose, Bld: 92 mg/dL (ref 70–99)
Potassium: 4.3 mmol/L (ref 3.5–5.1)
Sodium: 141 mmol/L (ref 135–145)

## 2021-06-01 LAB — PROTIME-INR
INR: 1 (ref 0.8–1.2)
Prothrombin Time: 13.1 seconds (ref 11.4–15.2)

## 2021-06-01 NOTE — ED Provider Notes (Signed)
I evaluated patient in triage.  She went to bed feeling normal last night and woke up this morning around 8 AM with her left leg feeling heavy and weak.  She reported initially having some mild symptoms in her left thumb and hand but those are gone.  She is able to ambulate and has mild decreased sensation in the left leg but no significant weakness.'s no aphasia or visual field cuts.  She is LVO negative and last known normal was last night when she went to bed.  At this time she does not meet criteria for code stroke but will need a stroke work-up.  Patient does have prior history of TIAs with recent work-up. ?  Blanchie Dessert, MD ?06/01/21 1108 ? ?

## 2021-06-01 NOTE — Discharge Instructions (Signed)
Use a walker and family assistance to ambulate (walk) safely and prevent falls.  Call your neurologist office in the morning to arrange follow-up, this can also be done with your primary care provider.  Return to the emergency room at anytime for any worsening or concerning symptoms. ?

## 2021-06-01 NOTE — Progress Notes (Signed)
Lower extremity venous left study completed. ? ?Preliminary results relayed to Armandina Gemma, MD. ? ?See CV Proc for preliminary results report.  ? ?Darlin Coco, RDMS, RVT ? ?

## 2021-06-01 NOTE — ED Provider Notes (Signed)
?[facility] ?Provider Note ? ? ?CSN: [id number] ?Arrival date & time: 06/01/21  1014 ? ?  ? ?History ? ?Chief Complaint  ?Patient presents with  ? Weakness  ? ? ?[name] is a 88 y.o. female. ? ?88 year old female with history of TIA 2 months ago and reports residual speech difficulty,, history of hyperlipidemia, DVT, anemia, presents with complaint of heaviness and numbness in her left leg.  Patient notes going to bed last night feeling her usual self, woke up this morning around 8 AM and noticed that her left foot was turned in and her leg was numb.  Patient states that she rubbed her leg for several minutes and was able to straighten her foot however when she tried to walk the leg felt heavy.  Also noted worsening in her ability to enunciate her words today.  She noted in triage some left thumb symptoms as well but does not recall that at this time and states her left arm feels normal at this time.  Denies recent falls or injuries.  No other complaints or concerns. ? ? ?  ? ?Home Medications ?Prior to Admission medications   ?Medication Sig Start Date End Date Taking? Authorizing Provider  ?acetaminophen (TYLENOL) 500 MG tablet Take 500-1,000 mg by mouth every 8 (eight) hours as needed for mild pain.     [provider]  ?albuterol (VENTOLIN HFA) 108 (90 Base) MCG/ACT inhaler Inhale 2 puffs into the lungs every 4 (four) hours as needed for wheezing or shortness of breath.  01/30/19   [provider]  ?aspirin EC 81 MG EC tablet Take 1 tablet (81 mg total) by mouth daily. Swallow whole. 04/06/21   Pokhrel, Corrie Mckusick, MD  ?Cholecalciferol (VITAMIN D-3) 25 MCG (1000 UT) CAPS Take 1,000 Units by mouth daily with breakfast.     [provider]  ?ezetimibe (ZETIA) 10 MG tablet Take 10 mg by mouth daily. 02/05/20   [provider]  ?Lactobacillus Rhamnosus, GG, (CULTURELLE) CAPS Take 1 capsule by mouth every morning.     [provider]   ?Magnesium 250 MG TABS Take 250 mg by mouth daily as needed (leg cramps).    [provider]  ?metoprolol tartrate (LOPRESSOR) 25 MG tablet Take 0.5 tablets by mouth 2 (two) times daily. 05/18/19   [provider]  ?Multiple Vitamins-Minerals (AIRBORNE PO) Take 1 tablet by mouth daily with breakfast.    [provider]  ?Multiple Vitamins-Minerals (CENTRUM SILVER ULTRA WOMENS) TABS Take 1 tablet by mouth daily with breakfast.     [provider]  ?pravastatin (PRAVACHOL) 20 MG tablet Take 2 tablets (40 mg total) by mouth daily. ?Patient taking differently: Take 40 mg by mouth daily. Pt states she takes medication as needed 04/05/21   Pokhrel, Corrie Mckusick, MD  ?Saccharomyces boulardii (PROBIOTIC) 250 MG CAPS Take by mouth daily. Pt isn't sure of the dosage    [provider]  ?Trospium Chloride 60 MG CP24 Take 1 capsule by mouth daily. 03/18/21   [provider]  ?vitamin B-12 (CYANOCOBALAMIN) 100 MCG tablet Take 100 mcg by mouth daily.    [provider]  ?   ? ?Allergies    ?Atorvastatin, Desmopressin acetate, Iodinated contrast media, Myrbetriq [mirabegron], and Iodine   ? ?Review of Systems   ?Review of Systems ?Negative except as per HPI ?Physical Exam ?Updated Vital Signs ?BP (!) 170/78   Pulse 71   Temp 98.3 ?F (36.8 ?C) (Oral)   Resp  20   SpO2 97%  ?Physical Exam ?Vitals and nursing note reviewed.  ?Constitutional:   ?   General: She is not in acute distress. ?   Appearance: She is well-developed. She is not diaphoretic.  ?HENT:  ?   Head: Normocephalic and atraumatic.  ?   Nose: Nose normal.  ?   Mouth/Throat:  ?   Mouth: Mucous membranes are moist.  ?Eyes:  ?   General: No visual field deficit. ?   Extraocular Movements: Extraocular movements intact.  ?   Pupils: Pupils are equal, round, and reactive to light.  ?Cardiovascular:  ?   Rate and Rhythm: Normal rate and regular rhythm.  ?   Heart sounds: Normal heart sounds.  ?Pulmonary:  ?   Effort:  Pulmonary effort is normal.  ?   Breath sounds: Normal breath sounds.  ?Abdominal:  ?   Palpations: Abdomen is soft.  ?   Tenderness: There is no abdominal tenderness.  ?Musculoskeletal:     ?   General: Swelling present. No tenderness.  ?   Cervical back: Neck supple.  ?   Comments: Mild swelling left ankle, no calf tenderness, no palpable cords  ?Skin: ?   General: Skin is warm and dry.  ?   Findings: No erythema or rash.  ?Neurological:  ?   Mental Status: She is alert and oriented to person, place, and time.  ?   Cranial Nerves: Dysarthria present. No cranial nerve deficit or facial asymmetry.  ?   Sensory: No sensory deficit.  ?   Motor: No weakness or pronator drift.  ?   Coordination: Finger-Nose-Finger Test normal.  ?   Gait: Gait abnormal.  ?   Deep Tendon Reflexes: Reflexes normal. Babinski sign absent on the right side. Babinski sign absent on the left side.  ?   Comments: Very slight difficulty noted with heel-to-shin using left leg.  ?Psychiatric:     ?   Behavior: Behavior normal.  ? ? ?ED Results / Procedures / Treatments   ?Labs ?(all labs ordered are listed, but only abnormal results are displayed) ?Labs Reviewed  ?CBC WITH DIFFERENTIAL/PLATELET - Abnormal; Notable for the following components:  ?    Result Value  ? RBC 3.71 (*)   ? Hemoglobin 11.4 (*)   ? HCT 32.2 (*)   ? All other components within normal limits  ?BASIC METABOLIC PANEL - Abnormal; Notable for the following components:  ? Creatinine, Ser 1.13 (*)   ? GFR, Estimated 46 (*)   ? All other components within normal limits  ?PROTIME-INR  ? ? ?EKG ?EKG Interpretation ? ?Date/Time:  Tuesday Jun 01 2021 10:18:56 EDT ?Ventricular Rate:  56 ?PR Interval:  150 ?QRS Duration: 110 ?QT Interval:  440 ?QTC Calculation: 424 ?R Axis:   -1 ?Text Interpretation: Sinus bradycardia Right bundle branch block Abnormal ECG When compared with ECG of 04-Apr-2021 18:32, PREVIOUS ECG IS PRESENT Confirmed by Regan Lemming ([id number]) on 06/01/2021 7:00:13  PM ? ?Radiology ?CT Head Wo Contrast ? ?Result Date: 06/01/2021 ?CLINICAL DATA:  Provided history: Transient ischemic attack (TIA). Additional history provided: Transient leg numbness. EXAM: CT HEAD WITHOUT CONTRAST TECHNIQUE: Contiguous axial images were obtained from the base of the skull through the vertex without intravenous contrast. RADIATION DOSE REDUCTION: This exam was performed according to the departmental dose-optimization program which includes automated exposure control, adjustment of the mA and/or kV according to patient size and/or use of iterative reconstruction technique. COMPARISON:  Brain MRI 04/05/2021. CT angiogram head/neck  04/04/2021. Head CT 04/04/2021. FINDINGS: Brain: Cerebral atrophy, stable from the prior brain MRI of 04/05/2021. Commensurate prominence of the ventricles and sulci. Mild patchy and ill-defined hypoattenuation within the cerebral white matter, nonspecific but compatible with chronic small vessel ischemic disease. There is no acute intracranial hemorrhage. No demarcated cortical infarct. No extra-axial fluid collection. No evidence of an intracranial mass. No midline shift. Vascular: No hyperdense vessel.  Atherosclerotic calcifications. Skull: Normal. Negative for fracture or focal lesion. Sinuses/Orbits: No mass or acute finding within the imaged orbits. No significant paranasal sinus disease at the imaged levels. IMPRESSION: No evidence of acute intracranial abnormality. Mild chronic small vessel image changes within the cerebral white matter. Generalized cerebral atrophy. Electronically Signed   By: Kellie Simmering D.O.   On: 06/01/2021 11:21  ? ?MR BRAIN WO CONTRAST ? ?Result Date: 06/01/2021 ?CLINICAL DATA:  Acute neuro deficit. Heaviness and numbness left leg EXAM: MRI HEAD WITHOUT CONTRAST TECHNIQUE: Multiplanar, multiecho pulse sequences of the brain and surrounding structures were obtained without intravenous contrast. COMPARISON:  CT head 06/01/2021.  MRI head  04/05/2021 FINDINGS: Brain: Negative for acute infarct. Negative for mass. Chronic microhemorrhage right occipital periventricular white matter. This is unchanged. Generalized atrophy. Mild chronic microvascular ischemic change

## 2021-06-01 NOTE — ED Notes (Signed)
Patient currently in MRI.

## 2021-06-01 NOTE — ED Triage Notes (Addendum)
EMS stated, family called ? TIA, she woke up this morning and her leg was crooked and when she got up to walk her leg felt weak. Pt. Stated, my leg feels heavy ?Last normal last night at 600pm. Had a TIA 2 months ago. Was just taken off of Plavix. ?

## 2021-06-01 NOTE — ED Notes (Signed)
RN reviewed discharge instructions w/ pt. Follow up and walker use reviewed, pt had no further questions. ?

## 2021-06-01 NOTE — ED Provider Triage Note (Signed)
Emergency Medicine Provider Triage Evaluation Note ? ?[place] , a 88 y.o. female  was evaluated in triage.  Pt complains of heaviness and numbness to the left lower extremity that she noticed waking up this morning around 8 AM.  States she had no symptoms when she went to bed last night.  Denies other complaints such as facial droop, difficulty finding words, upper extremity symptoms. ? ?Review of Systems  ?Positive: As above ?Negative: As above ? ?Physical Exam  ?BP (!) 159/69 (BP Location: Right Arm)   Pulse (!) 56   Temp 98.3 ?F (36.8 ?C) (Oral)   Resp 16   SpO2 100%  ?Gen:   Awake, no distress   ?Resp:  Normal effort  ?MSK:   Moves extremities without difficulty  ?Other:  Without facial droop, without dysarthria.  Cranial nerves III through XII intact.  Decreased sensation in left lower extremity.  5/5 strength in bilateral lower and upper extremities. ? ?Medical Decision Making  ?Medically screening exam initiated at 10:49 AM.  Appropriate orders placed.  Vasilisa P Inabinet was informed that the remainder of the evaluation will be completed by another provider, this initial triage assessment does not replace that evaluation, and the importance of remaining in the ED until their evaluation is complete. ? ? ?  ?Evlyn Courier, PA-C ?06/01/21 1051 ? ?

## 2021-06-04 DIAGNOSIS — R200 Anesthesia of skin: Secondary | ICD-10-CM | POA: Diagnosis not present

## 2021-06-04 DIAGNOSIS — R519 Headache, unspecified: Secondary | ICD-10-CM | POA: Diagnosis not present

## 2021-06-07 ENCOUNTER — Ambulatory Visit: Payer: Medicare PPO | Admitting: Physical Therapy

## 2021-06-07 ENCOUNTER — Encounter (INDEPENDENT_AMBULATORY_CARE_PROVIDER_SITE_OTHER): Payer: Medicare PPO | Admitting: Ophthalmology

## 2021-06-07 DIAGNOSIS — J029 Acute pharyngitis, unspecified: Secondary | ICD-10-CM | POA: Diagnosis not present

## 2021-06-07 DIAGNOSIS — R059 Cough, unspecified: Secondary | ICD-10-CM | POA: Diagnosis not present

## 2021-06-07 DIAGNOSIS — Z20822 Contact with and (suspected) exposure to covid-19: Secondary | ICD-10-CM | POA: Diagnosis not present

## 2021-06-15 ENCOUNTER — Encounter (INDEPENDENT_AMBULATORY_CARE_PROVIDER_SITE_OTHER): Payer: Medicare PPO | Admitting: Ophthalmology

## 2021-06-15 ENCOUNTER — Ambulatory Visit: Payer: Medicare PPO | Admitting: Physical Therapy

## 2021-06-15 ENCOUNTER — Encounter: Payer: Self-pay | Admitting: Physical Therapy

## 2021-06-15 DIAGNOSIS — M6281 Muscle weakness (generalized): Secondary | ICD-10-CM | POA: Diagnosis not present

## 2021-06-15 DIAGNOSIS — I10 Essential (primary) hypertension: Secondary | ICD-10-CM | POA: Diagnosis not present

## 2021-06-15 DIAGNOSIS — R2681 Unsteadiness on feet: Secondary | ICD-10-CM | POA: Diagnosis not present

## 2021-06-15 DIAGNOSIS — R2689 Other abnormalities of gait and mobility: Secondary | ICD-10-CM

## 2021-06-15 DIAGNOSIS — H353231 Exudative age-related macular degeneration, bilateral, with active choroidal neovascularization: Secondary | ICD-10-CM | POA: Diagnosis not present

## 2021-06-15 DIAGNOSIS — H35033 Hypertensive retinopathy, bilateral: Secondary | ICD-10-CM | POA: Diagnosis not present

## 2021-06-15 DIAGNOSIS — H43813 Vitreous degeneration, bilateral: Secondary | ICD-10-CM | POA: Diagnosis not present

## 2021-06-15 NOTE — Therapy (Signed)
OUTPATIENT PHYSICAL THERAPY TREATMENT NOTE   Patient Name: [name] MRN: [id number] DOB:Mar 28, 1927, 88 y.o., female Today's Date: 06/15/2021  PCP: Lavone Orn, MD REFERRING PROVIDER: Lavone Orn, MD  END OF SESSION:   PT End of Session - 06/15/21 1150     Visit Number 4    Number of Visits 9   8+eval   Date for PT Re-Evaluation 07/02/21    Authorization Type [employer] MEDICARE CHOICE PPO    Progress Note Due on Visit 10    PT Start Time 1145    PT Stop Time 1225    PT Time Calculation (min) 40 min    Equipment Utilized During Treatment Gait belt    Activity Tolerance Patient tolerated treatment well;Treatment limited secondary to medical complications (Comment)    Behavior During Therapy [facility] for tasks assessed/performed             Past Medical History:  Diagnosis Date   Abnormal Pap smear 1975-76   Acute deep vein thrombosis (DVT) of left peroneal vein (HCC)    Anemia    history of   Arthritis    spine   Breast cyst, left 1980   Cystocele 2012   Diverticulosis    with h/o Diverticulitis   DVT, lower extremity, distal, chronic ([place]) 12/24/2018   GERD (gastroesophageal reflux disease)    H/O hypercholesterolemia    H/O osteopenia    H/O varicella    H/O varicose veins    Hx of Mumps    Macular degeneration    Seasonal allergies    Urge incontinence 2012   Urinary frequency 2010   Past Surgical History:  Procedure Laterality Date   ABDOMINAL HYSTERECTOMY     BLADDER SUSPENSION N/A 06/16/2016   Procedure: TRANSVAGINAL TAPE (TVT) PROCEDURE;  Surgeon: Everett Graff, MD;  Location: [facility];  Service: Gynecology;  Laterality: N/A;   BREAST CYST ASPIRATION  1964   COLONOSCOPY     Dr. Jodene Nam REPAIR N/A 06/16/2016   Procedure: ANTERIOR REPAIR (CYSTOCELE);  Surgeon: Everett Graff, MD;  Location: [facility];  Service: Gynecology;  Laterality: N/A;   CYSTOSCOPY N/A 06/16/2016   Procedure: CYSTOSCOPY;  Surgeon: Everett Graff, MD;  Location: [facility];   Service: Gynecology;  Laterality: N/A;  see anterior repair   Lower Extremity Venous Dopplers  01/09/2012   Right and left lower steroids: No evidence of thrombus or, thrombophlebitis; right and left GSV and SSV: No venous insufficiency. Normal exam.   NM MYOVIEW LTD  11/2016   6.6 METS.  Reached 106% of max.  Heart rate.  Walk for 4:40 min.  EF 72%.  Normal blood pressure response.  upsloping ST segment depression, nonspecific.  Otherwise normal study.  LOW RISK.  No evidence of ischemia or infarction.   TRANSTHORACIC ECHOCARDIOGRAM  01/2018   EF > 65%. LV size small. Gr 1 DD (normal for age).   Normal atrial size. Moderate Aortic Sclerosis without Stenosis. Moderate Mitral Annular Calcification with mild MR & no MS   Patient Active Problem List   Diagnosis Date Noted   History of DVT of lower extremity 04/04/2021   Chronic kidney disease, stage 3a ([place]) 04/04/2021   AKI (acute kidney injury) ([place]) 04/04/2021   Unintentional weight loss 04/04/2021   Dyslipidemia, goal LDL below 100 04/12/2020   Palpitations 05/30/2018   Irregular heartbeat 03/02/2018   TIA (transient ischemic attack) 02/15/2018   Urinary, incontinence, stress female 06/16/2016   Right carotid bruit 12/23/2014   Exertional dyspnea  12/24/2013   Bilateral arm numbness and tingling while sleeping - and shortly after waking 12/24/2013   Poor balance 12/24/2013   Edema of left lower extremity 12/21/2012   Essential hypertension    H/O hypercholesterolemia    Osteoporosis - of the spine 09/22/2011    REFERRING DIAG: G45.9 (ICD-10-CM) - Transient cerebral ischemic attack, unspecified   THERAPY DIAG:  Unsteadiness on feet  Muscle weakness (generalized)  Other abnormalities of gait and mobility  PERTINENT HISTORY: 04/04/2021-TIA presented to [facility], per ED note:  "88 year old female presents with acute onset of aphasia was last for several hours.  Had associated diffuse weakness throughout her body.  Some headache  but no facial droop.  Symptoms resolved spontaneously"   PMH:   HTN, hypercholestremia, LLE edema, spinal osteoporosis-not on fosamax, R carotid bruit, urinary stress incontinence, exertional dyspnea, AKI 04/04/2021, hx of DVT, irregular heartbeat   Pt known to clinic prior from June 2021 for dizziness post-COVID. (Saw Letta Moynahan)  PRECAUTIONS: Fall  SUBJECTIVE: Pt states she has not been able to do much of HEP.  PAIN:  Are you having pain? No  OBJECTIVE: TODAY'S TREATMENT:  Reviewed HEP: -Tandem stance EO x30 sec each LE in front> EC 2x30 sec regressed EC to semi-tandem x30sec each LE in front w/ edu to modify HEP this way.  Intermittent CGA for EC w/ tandem due to lateral LOB. -Feet together EO x30 seconds>FT EC x30 seconds -Heel raises w/ countertop support 2x8 -Standing march w/ countertop support 2x20 -Tandem walking with countertop support 4x8' -Side-stepping w/o resistance 4x8' Printed additional copy of HEP for pt.  Assessed TUG no AD (1st attempt):  19.61 seconds Assessed TUG no AD (2nd attempt):  19.68 seconds  Assessed 10MWT no AD normal pace:  14.05 sec = 0.71 m/sec OR 2.35 ft/sec Assessed 10MWT no AD fast pace:  12.98 sec =   0.77 m/sec OR 2.54 ft/sec Pt states left leg heaviness during TUG and 10MWT w/ decreased left stance time noted during second bout of 10MWT.  Assessed left BP in sitting:  126/66, HR 81 bpm  PATIENT EDUCATION: Education details: Modifications to HEP. Person educated: Patient Education method: Explanation Education comprehension: verbalized understanding and needs further education   HOME EXERCISE PROGRAM: Access Code: [email or web address] URL: [email or web address] Date: 05/11/2021 Prepared by: Elease Etienne  Exercises - Tandem Stance with Eyes Closed in Corner  - 1 x daily - 5 x weekly - 1 sets - 2 reps - 30 seconds hold -Modified to semi-tandem - Standing Near Stance in Corner with Eyes Closed  - 1 x daily - 5 x weekly - 1 sets - 2  reps - 30 seconds hold - Tandem Stance in Corner  - 1 x daily - 5 x weekly - 1 sets - 2 reps - 30 seconds hold - Heel Raises with Counter Support  - 1 x daily - 5 x weekly - 2 sets - 10 reps - Standing March with Counter Support  - 1 x daily - 5 x weekly - 2 sets - 20 reps - Tandem Walking with Counter Support  - 1 x daily - 5 x weekly - 3 sets - 10 reps  *Edu to use back of chair in front during corner balance exercises for safety using fingertip touch as needed.  - Side Stepping with Resistance at Thighs and Counter Support  - 1 x daily - 5 x weekly - 3 sets - 10 reps   GOALS: Goals reviewed with patient? Yes  SHORT TERM GOALS: Target date:  06/04/2021   Pt will be independent with initial strength and balance HEP. Baseline:  Established, reviewed 06/15/2021. Goal status: ONGOING   2.  Pt will demonstrate TUG of <13 seconds in order to decrease risk of falls and improve functional mobility using LRAD. Baseline: 13.64 sec; 19.61 seconds 06/15/2021 Goal status: NOT MET   3.  Pt will demonstrate a gait speed of >3.33 feet/sec in order to decrease risk for falls. Baseline: 3.06 ft/sec; 2.54 ft/sec no AD 06/15/2021 Goal status: NOT MET   4.  DGI to be assessed with LTG set as appropriate. Baseline: FGA assessed due to pt functional status. Goal status: REVISED (session prior); MET     LONG TERM GOALS: Target date:  07/02/2021   Pt will be independent with finalized strength and balance HEP. Baseline: Not established. Goal status: INITIAL   2.  Pt will ambulate >750 feet supervision level w/o AD and no overt LOB to promote safe return to prior level of Baseline: 366' w/o AD supervision level Goal status: INITIAL   3.  Pt will decrease 5xSTS to <13 seconds in order to demonstrate decreased risk for falls and improved functional bilateral LE strength and power. Baseline: 16.28sec, pt winded following Goal status: INITIAL   4.  Pt will improve FGA score to >/=25/30 in order to  demonstrate improved balance and decreased fall risk. Baseline: 20/30 Goal status: INITIAL   ASSESSMENT:   CLINICAL IMPRESSION: Time spent reviewing HEP today to promote compliance and safety outside clinic as pt has not had much time to review independently.  Reassessed TUG with pt performing in 19.61 seconds and performed 10MWT in 2.54 ft/sec.  Pt decline in performance from evaluation may be due to increased heaviness noted in left lower extremity that she reports worsened upon reassessments.  Will continue to progress towards long-term goals as medically able.     OBJECTIVE IMPAIRMENTS Abnormal gait, decreased activity tolerance, decreased balance, decreased endurance, decreased knowledge of condition, decreased knowledge of use of DME, and decreased strength.    ACTIVITY LIMITATIONS community activity.    PERSONAL FACTORS Age, Past/current experiences, and 3+ comorbidities: HTN. hypercholestremia, LLE edema, spinal osteoporosis-not on fosamax, R carotid bruit, urinary stress incontinence, exertional dyspnea, AKI 04/04/2021, hx of DVT, irregular heartbeat  are also affecting patient's functional outcome.      REHAB POTENTIAL: Good   CLINICAL DECISION MAKING: Evolving/moderate complexity   EVALUATION COMPLEXITY: Moderate   PLAN: PT FREQUENCY: 1x/week   PT DURATION: 8 weeks   PLANNED INTERVENTIONS: Therapeutic exercises, Therapeutic activity, Neuromuscular re-education, Balance training, Gait training, Patient/Family education, Stair training, Vestibular training, Canalith repositioning, DME instructions, and Manual therapy   PLAN FOR NEXT SESSION:  Endurance, incorporate head turn and nods to dynamic and static activity, tandem with head turns and head nods, backwards walking, side stepping w/ resistance, stair step ups work on dec UE support, tilt board, further assess VOR and vestibular as needed    Bary Richard, PT, DPT 06/15/2021, 1:00 PM

## 2021-06-18 ENCOUNTER — Other Ambulatory Visit: Payer: Self-pay | Admitting: Physician Assistant

## 2021-06-18 ENCOUNTER — Ambulatory Visit
Admission: RE | Admit: 2021-06-18 | Discharge: 2021-06-18 | Disposition: A | Discharge status: Home / Self Care | Payer: Medicare PPO | Source: Ambulatory Visit | Attending: Physician Assistant | Admitting: Physician Assistant

## 2021-06-18 DIAGNOSIS — R051 Acute cough: Secondary | ICD-10-CM

## 2021-06-18 DIAGNOSIS — R0602 Shortness of breath: Secondary | ICD-10-CM | POA: Diagnosis not present

## 2021-06-18 DIAGNOSIS — J209 Acute bronchitis, unspecified: Secondary | ICD-10-CM | POA: Diagnosis not present

## 2021-06-18 DIAGNOSIS — R059 Cough, unspecified: Secondary | ICD-10-CM | POA: Diagnosis not present

## 2021-06-18 DIAGNOSIS — R0989 Other specified symptoms and signs involving the circulatory and respiratory systems: Secondary | ICD-10-CM | POA: Diagnosis not present

## 2021-06-21 ENCOUNTER — Ambulatory Visit: Payer: Medicare PPO | Attending: Internal Medicine | Admitting: Physical Therapy

## 2021-06-21 ENCOUNTER — Encounter: Payer: Self-pay | Admitting: Physical Therapy

## 2021-06-21 VITALS — BP 110/64 | HR 87

## 2021-06-21 DIAGNOSIS — M6281 Muscle weakness (generalized): Secondary | ICD-10-CM | POA: Diagnosis not present

## 2021-06-21 DIAGNOSIS — R2681 Unsteadiness on feet: Secondary | ICD-10-CM | POA: Insufficient documentation

## 2021-06-21 DIAGNOSIS — R2689 Other abnormalities of gait and mobility: Secondary | ICD-10-CM | POA: Diagnosis not present

## 2021-06-21 NOTE — Therapy (Unsigned)
OUTPATIENT PHYSICAL THERAPY TREATMENT NOTE   Patient Name: [name] MRN: [id number] DOB:1927/07/27, 88 y.o., female Today's Date: 06/22/2021  PCP: Lavone Orn, MD REFERRING PROVIDER: Lavone Orn, MD  END OF SESSION:   PT End of Session - 06/21/21 1332     Visit Number 5    Number of Visits 9   8+eval   Date for PT Re-Evaluation 07/02/21    Authorization Type [employer] MEDICARE CHOICE PPO    Progress Note Due on Visit 10    PT Start Time 1316    PT Stop Time 1402    PT Time Calculation (min) 46 min    Equipment Utilized During Treatment Gait belt    Activity Tolerance Patient tolerated treatment well;Treatment limited secondary to medical complications (Comment)    Behavior During Therapy [facility] for tasks assessed/performed             Past Medical History:  Diagnosis Date   Abnormal Pap smear 1975-76   Acute deep vein thrombosis (DVT) of left peroneal vein (HCC)    Anemia    history of   Arthritis    spine   Breast cyst, left 1980   Cystocele 2012   Diverticulosis    with h/o Diverticulitis   DVT, lower extremity, distal, chronic ([place]) 12/24/2018   GERD (gastroesophageal reflux disease)    H/O hypercholesterolemia    H/O osteopenia    H/O varicella    H/O varicose veins    Hx of Mumps    Macular degeneration    Seasonal allergies    Urge incontinence 2012   Urinary frequency 2010   Past Surgical History:  Procedure Laterality Date   ABDOMINAL HYSTERECTOMY     BLADDER SUSPENSION N/A 06/16/2016   Procedure: TRANSVAGINAL TAPE (TVT) PROCEDURE;  Surgeon: Everett Graff, MD;  Location: [facility];  Service: Gynecology;  Laterality: N/A;   BREAST CYST ASPIRATION  1964   COLONOSCOPY     Dr. Jodene Nam REPAIR N/A 06/16/2016   Procedure: ANTERIOR REPAIR (CYSTOCELE);  Surgeon: Everett Graff, MD;  Location: [facility];  Service: Gynecology;  Laterality: N/A;   CYSTOSCOPY N/A 06/16/2016   Procedure: CYSTOSCOPY;  Surgeon: Everett Graff, MD;  Location: [facility];   Service: Gynecology;  Laterality: N/A;  see anterior repair   Lower Extremity Venous Dopplers  01/09/2012   Right and left lower steroids: No evidence of thrombus or, thrombophlebitis; right and left GSV and SSV: No venous insufficiency. Normal exam.   NM MYOVIEW LTD  11/2016   6.6 METS.  Reached 106% of max.  Heart rate.  Walk for 4:40 min.  EF 72%.  Normal blood pressure response.  upsloping ST segment depression, nonspecific.  Otherwise normal study.  LOW RISK.  No evidence of ischemia or infarction.   TRANSTHORACIC ECHOCARDIOGRAM  01/2018   EF > 65%. LV size small. Gr 1 DD (normal for age).   Normal atrial size. Moderate Aortic Sclerosis without Stenosis. Moderate Mitral Annular Calcification with mild MR & no MS   Patient Active Problem List   Diagnosis Date Noted   History of DVT of lower extremity 04/04/2021   Chronic kidney disease, stage 3a ([place]) 04/04/2021   AKI (acute kidney injury) ([place]) 04/04/2021   Unintentional weight loss 04/04/2021   Dyslipidemia, goal LDL below 100 04/12/2020   Palpitations 05/30/2018   Irregular heartbeat 03/02/2018   TIA (transient ischemic attack) 02/15/2018   Urinary, incontinence, stress female 06/16/2016   Right carotid bruit 12/23/2014   Exertional dyspnea  12/24/2013   Bilateral arm numbness and tingling while sleeping - and shortly after waking 12/24/2013   Poor balance 12/24/2013   Edema of left lower extremity 12/21/2012   Essential hypertension    H/O hypercholesterolemia    Osteoporosis - of the spine 09/22/2011    REFERRING DIAG: G45.9 (ICD-10-CM) - Transient cerebral ischemic attack, unspecified   THERAPY DIAG:  Unsteadiness on feet  Muscle weakness (generalized)  Other abnormalities of gait and mobility  PERTINENT HISTORY: 04/04/2021-TIA presented to [facility], per ED note:  "88 year old female presents with acute onset of aphasia was last for several hours.  Had associated diffuse weakness throughout her body.  Some headache  but no facial droop.  Symptoms resolved spontaneously"   PMH:   HTN, hypercholestremia, LLE edema, spinal osteoporosis-not on fosamax, R carotid bruit, urinary stress incontinence, exertional dyspnea, AKI 04/04/2021, hx of DVT, irregular heartbeat   Pt known to clinic prior from June 2021 for dizziness post-COVID. (Saw Letta Moynahan)  PRECAUTIONS: Fall  SUBJECTIVE: Pt states she was light headed earlier today, but that her BP was 116/60s this morning.  She is afraid this is too low for her.  PAIN:  Are you having pain? No  OBJECTIVE: TODAY'S TREATMENT:  VITALS (Onset of Session): Today's Vitals   06/21/21 1328  BP: 110/64  Pulse: 87   SpO2:  99% on room air  SELF-CARE/HOME MANAGEMENT: -Pt reports recent lung issues noted by recent (06/21/2021) visit to MD (crackles, possible right lower lobe pneumonia not visible on x-ray, finishing antibiotics).  Discussed moving around as able and deep diaphragmatic breathing strategies to prevent worsening in addition to pacing activity to prevent excess light-headedness.  Extensive discussion of discharge plan due to ongoing medical issues, realistic goals for therapy, concerns about limited progression, and anxiety contributing to continued dizziness.  Pt in agreement to re-visit plan next session following neurologist follow-up.  NMR: -anterior-posterior oriented tilt board holding level>rocking forward and backward>head turns holding board level>single midline cone taps>bilateral cone taps (pt progresses each from BUE to no UE support, intermittent CGA for bilateral cones taps w/o UE support) -6" step ups w/ RUE support x10 each LE>w/ head turns x10 each LE> no UE support x10 each LE SBA  PATIENT EDUCATION: Education details: Continue HEP.   Person educated: Patient Education method: Explanation Education comprehension: verbalized understanding and needs further education   HOME EXERCISE PROGRAM: Access Code: [email or web address] URL:  [email or web address] Date: 05/11/2021 Prepared by: Elease Etienne  Exercises - Tandem Stance with Eyes Closed in Corner  - 1 x daily - 5 x weekly - 1 sets - 2 reps - 30 seconds hold -Modified to semi-tandem - Standing Near Stance in Corner with Eyes Closed  - 1 x daily - 5 x weekly - 1 sets - 2 reps - 30 seconds hold - Tandem Stance in Corner  - 1 x daily - 5 x weekly - 1 sets - 2 reps - 30 seconds hold - Heel Raises with Counter Support  - 1 x daily - 5 x weekly - 2 sets - 10 reps - Standing March with Counter Support  - 1 x daily - 5 x weekly - 2 sets - 20 reps - Tandem Walking with Counter Support  - 1 x daily - 5 x weekly - 3 sets - 10 reps  *Edu to use back of chair in front during corner balance exercises for safety using fingertip touch as needed.  - Side Stepping with Resistance at Thighs and Counter Support  -  1 x daily - 5 x weekly - 3 sets - 10 reps   GOALS: Goals reviewed with patient? Yes   SHORT TERM GOALS: Target date:  06/04/2021   Pt will be independent with initial strength and balance HEP. Baseline:  Established, reviewed 06/15/2021. Goal status: ONGOING   2.  Pt will demonstrate TUG of <13 seconds in order to decrease risk of falls and improve functional mobility using LRAD. Baseline: 13.64 sec; 19.61 seconds 06/15/2021 Goal status: NOT MET   3.  Pt will demonstrate a gait speed of >3.33 feet/sec in order to decrease risk for falls. Baseline: 3.06 ft/sec; 2.54 ft/sec no AD 06/15/2021 Goal status: NOT MET   4.  DGI to be assessed with LTG set as appropriate. Baseline: FGA assessed due to pt functional status. Goal status: REVISED (session prior); MET     LONG TERM GOALS: Target date:  07/02/2021   Pt will be independent with finalized strength and balance HEP. Baseline: Not established. Goal status: INITIAL   2.  Pt will ambulate >750 feet supervision level w/o AD and no overt LOB to promote safe return to prior level of Baseline: 366' w/o AD  supervision level Goal status: INITIAL   3.  Pt will decrease 5xSTS to <13 seconds in order to demonstrate decreased risk for falls and improved functional bilateral LE strength and power. Baseline: 16.28sec, pt winded following Goal status: INITIAL   4.  Pt will improve FGA score to >/=25/30 in order to demonstrate improved balance and decreased fall risk. Baseline: 20/30 Goal status: INITIAL   ASSESSMENT:   CLINICAL IMPRESSION: Most time spent during session discussing plan to discharge next session and addressing lingering patient concerns.  Progressed to NMR on tilt board and using 6" step for increased dynamic challenge with head turns and no UE support.  Pt is progressing towards LTGs.     OBJECTIVE IMPAIRMENTS Abnormal gait, decreased activity tolerance, decreased balance, decreased endurance, decreased knowledge of condition, decreased knowledge of use of DME, and decreased strength.    ACTIVITY LIMITATIONS community activity.    PERSONAL FACTORS Age, Past/current experiences, and 3+ comorbidities: HTN. hypercholestremia, LLE edema, spinal osteoporosis-not on fosamax, R carotid bruit, urinary stress incontinence, exertional dyspnea, AKI 04/04/2021, hx of DVT, irregular heartbeat  are also affecting patient's functional outcome.      REHAB POTENTIAL: Good   CLINICAL DECISION MAKING: Evolving/moderate complexity   EVALUATION COMPLEXITY: Moderate   PLAN: PT FREQUENCY: 1x/week   PT DURATION: 8 weeks   PLANNED INTERVENTIONS: Therapeutic exercises, Therapeutic activity, Neuromuscular re-education, Balance training, Gait training, Patient/Family education, Stair training, Vestibular training, Canalith repositioning, DME instructions, and Manual therapy   PLAN FOR NEXT SESSION:  Assess LTGs-D/C 06/28/2021-revisit if anything is different from neurologist visit 06/24/21.  Endurance, incorporate head turn and nods to dynamic and static activity, tandem with head turns and head nods,  backwards walking, side stepping w/ resistance, stair step ups work on dec UE support    Bary Richard, PT, DPT 06/22/2021, 8:38 AM

## 2021-06-24 ENCOUNTER — Ambulatory Visit: Payer: Medicare PPO | Admitting: Diagnostic Neuroimaging

## 2021-06-24 ENCOUNTER — Encounter: Payer: Self-pay | Admitting: Diagnostic Neuroimaging

## 2021-06-24 VITALS — BP 116/66 | HR 75 | Ht 63.0 in | Wt 124.0 lb

## 2021-06-24 DIAGNOSIS — R29898 Other symptoms and signs involving the musculoskeletal system: Secondary | ICD-10-CM

## 2021-06-24 NOTE — Progress Notes (Signed)
[facility]  PATIENT: [name] DOB: 1927/10/26  REFERRING CLINICIAN: Lavone Orn, MD HISTORY FROM: patient  REASON FOR VISIT: new consult    HISTORICAL  CHIEF COMPLAINT:  Chief Complaint  Patient presents with   Leg heaviness    Rm 7 New Pt hospital Ref  "I've had a TIA but I don't know what this last episode was- my left leg felt numb, my toes were twisted, I feel dizzy and weak when I walk, weakness from left hip down my leg"    HISTORY OF PRESENT ILLNESS:   88 year old female here for evaluation of left leg weakness.  History of hypertension, TIA.  06/01/2021 patient presented to emergency room after waking up with left leg weakness and heaviness.  Went to the ER for evaluation.  CT and MRI of the brain were unremarkable.  Patient was recommended follow-up in neurology clinic for lumbar spine evaluation.  Today patient feels some mild weakness and numbness in left leg.  She has history of low back pain with sciatica symptoms (not sure if right or left side) in the past.  Patient was having some intermittent left thumb tightness, weakness that is intermittent and independent of left leg weakness.   REVIEW OF SYSTEMS: Full 14 system review of systems performed and negative with exception of: as per HPI.  ALLERGIES: Allergies  Allergen Reactions   Atorvastatin Other (See Comments)   Desmopressin Acetate Other (See Comments)   Iodinated Contrast Media Hives and Other (See Comments)   Myrbetriq [Mirabegron] Other (See Comments)    "Makes me urinate more frequently"   Iodine Rash    HOME MEDICATIONS: Outpatient Medications Prior to Visit  Medication Sig Dispense Refill   acetaminophen (TYLENOL) 500 MG tablet Take 500-1,000 mg by mouth every 8 (eight) hours as needed for mild pain.      albuterol (VENTOLIN HFA) 108 (90 Base) MCG/ACT inhaler Inhale 2 puffs into the lungs every 4 (four) hours as needed for wheezing or shortness of breath.       aspirin EC 81 MG EC tablet Take 1 tablet (81 mg total) by mouth daily. Swallow whole. 30 tablet 11   Cholecalciferol (VITAMIN D-3) 25 MCG (1000 UT) CAPS Take 1,000 Units by mouth daily with breakfast.      ezetimibe (ZETIA) 10 MG tablet Take 10 mg by mouth daily.     Lactobacillus Rhamnosus, GG, (CULTURELLE) CAPS Take 1 capsule by mouth every morning.      Magnesium 250 MG TABS Take 250 mg by mouth daily as needed (leg cramps).     metoprolol succinate (TOPROL-XL) 25 MG 24 hr tablet Take 25 mg by mouth daily.     Multiple Vitamins-Minerals (AIRBORNE PO) Take 1 tablet by mouth daily with breakfast.     Multiple Vitamins-Minerals (CENTRUM SILVER ULTRA WOMENS) TABS Take 1 tablet by mouth daily with breakfast.      pravastatin (PRAVACHOL) 20 MG tablet Take 2 tablets (40 mg total) by mouth daily. (Patient taking differently: Take 40 mg by mouth daily. Pt states she takes medication as needed) 30 tablet 2   Saccharomyces boulardii (PROBIOTIC) 250 MG CAPS Take by mouth daily. Pt isn't sure of the dosage     Trospium Chloride 60 MG CP24 Take 1 capsule by mouth daily.     vitamin B-12 (CYANOCOBALAMIN) 100 MCG tablet Take 100 mcg by mouth daily.     metoprolol tartrate (LOPRESSOR) 25 MG tablet Take 0.5 tablets by mouth 2 (two) times daily.  No facility-administered medications prior to visit.      PHYSICAL EXAM  GENERAL EXAM/CONSTITUTIONAL: Vitals:  Vitals:   06/24/21 1318  BP: 116/66  Pulse: 75  Weight: 124 lb (56.2 kg)  Height: '5\' 3"'$  (1.6 m)   Body mass index is 21.97 kg/m. Wt Readings from Last 3 Encounters:  06/24/21 124 lb (56.2 kg)  05/24/21 127 lb (57.6 kg)  04/04/21 130 lb 1.1 oz (59 kg)   Patient is in no distress; well developed, nourished and groomed; neck is supple  CARDIOVASCULAR: Examination of carotid arteries is normal; no carotid bruits Regular rate and rhythm, no murmurs Examination of peripheral vascular system by observation and palpation is  normal  EYES: Ophthalmoscopic exam of optic discs and posterior segments is normal; no papilledema or hemorrhages No results found.  MUSCULOSKELETAL: Gait, strength, tone, movements noted in Neurologic exam below  NEUROLOGIC: MENTAL STATUS:      No data to display         awake, alert, oriented to person, place and time recent and remote memory intact normal attention and concentration language fluent, comprehension intact, naming intact fund of knowledge appropriate  CRANIAL NERVE:  2nd - no papilledema on fundoscopic exam 2nd, 3rd, 4th, 6th - pupils equal and reactive to light, visual fields full to confrontation, extraocular muscles intact, no nystagmus 5th - facial sensation symmetric 7th - facial strength symmetric 8th - hearing intact 9th - palate elevates symmetrically, uvula midline 11th - shoulder shrug symmetric 12th - tongue protrusion midline  MOTOR:  normal bulk and tone, full strength in the BUE, BLE; EXCEPT DECR IN LEFT LEG (HF 4, KNEE EXT/FLEX 4, DF4+)  SENSORY:  normal and symmetric to light touch, pinprick, temperature, vibration; EXCEPT DECR IN LEFT LEG  COORDINATION:  finger-nose-finger, fine finger movements normal  REFLEXES:  deep tendon reflexes TRACE and symmetric  GAIT/STATION:  narrow based gait; SLOW AND CAUTIOUS     DIAGNOSTIC DATA (LABS, IMAGING, TESTING) - I reviewed patient records, labs, notes, testing and imaging myself where available.  Lab Results  Component Value Date   WBC 7.8 06/01/2021   HGB 11.4 (L) 06/01/2021   HCT 32.2 (L) 06/01/2021   MCV 86.8 06/01/2021   PLT 202 06/01/2021      Component Value Date/Time   NA 141 06/01/2021 1051   K 4.3 06/01/2021 1051   CL 110 06/01/2021 1051   CO2 25 06/01/2021 1051   GLUCOSE 92 06/01/2021 1051   BUN 21 06/01/2021 1051   CREATININE 1.13 (H) 06/01/2021 1051   CREATININE 0.98 08/24/2011 1631   CALCIUM 9.4 06/01/2021 1051   PROT 6.0 (L) 04/05/2021 0825   ALBUMIN 3.2  (L) 04/05/2021 0825   AST 21 04/05/2021 0825   ALT 12 04/05/2021 0825   ALKPHOS 37 (L) 04/05/2021 0825   BILITOT 0.8 04/05/2021 0825   GFRNONAA 46 (L) 06/01/2021 1051   GFRAA 49 (L) 02/13/2019 1627   Lab Results  Component Value Date   CHOL 148 04/05/2021   HDL 46 04/05/2021   LDLCALC 87 04/05/2021   TRIG 77 04/05/2021   CHOLHDL 3.2 04/05/2021   Lab Results  Component Value Date   HGBA1C 5.7 (H) 04/05/2021   Lab Results  Component Value Date   BTDVVOHY07 371 (H) 09/29/2010   Lab Results  Component Value Date   TSH 2.427 02/16/2018    06/01/21 MRI brain [I reviewed images myself and agree with interpretation. -VRP]  - Negative for acute infarct - Atrophy and mild chronic  microvascular ischemic change in the white matter.    ASSESSMENT AND PLAN  88 y.o. year old female here with:   Dx:  1. Left leg weakness       PLAN:  - check MRI lumbar spine (rule out spinal stenosis or left lumbar radiculopathy; eval left leg weakness and numbness) - use cane /walker as needed  Orders Placed This Encounter  Procedures   MR [place]   Return for pending if symptoms worsen or fail to improve, pending test results.    Penni Bombard, MD 07/23/7207, 4:70 PM Certified in Neurology, Neurophysiology and Neuroimaging  [facility] [address] [place], [place] [address] [phone]

## 2021-06-24 NOTE — Patient Instructions (Signed)
-   check MRI lumbar spine  - use cane / walker as needed

## 2021-06-28 ENCOUNTER — Ambulatory Visit: Payer: Medicare PPO | Admitting: Physical Therapy

## 2021-06-28 ENCOUNTER — Encounter: Payer: Self-pay | Admitting: Physical Therapy

## 2021-06-28 VITALS — BP 118/76 | HR 82

## 2021-06-28 DIAGNOSIS — R2689 Other abnormalities of gait and mobility: Secondary | ICD-10-CM

## 2021-06-28 DIAGNOSIS — R2681 Unsteadiness on feet: Secondary | ICD-10-CM | POA: Diagnosis not present

## 2021-06-28 DIAGNOSIS — M6281 Muscle weakness (generalized): Secondary | ICD-10-CM

## 2021-06-28 NOTE — Therapy (Signed)
OUTPATIENT PHYSICAL THERAPY TREATMENT NOTE/DISCHARGE SUMMARY   Patient Name: [name] MRN: [id number] DOB:[birth year], 88 y.o., female Today's Date: 06/28/2021  PCP: Lavone Orn, MD REFERRING PROVIDER: Lavone Orn, MD  PHYSICAL THERAPY DISCHARGE SUMMARY  Visits from Start of Care: 6  Current functional level related to goals / functional outcomes: See clinical impression statement for progress towards goals.   Remaining deficits: Pt remains limited by lingering respiratory issues noted from session prior.  She continues to have some possible sciatic nerve involvement on the left lower extremity limiting tolerance to activity.   Education / Equipment: Edu on [employer], walking program, and discharge plan for today.   Patient agrees to discharge. Patient goals were partially met. Patient is being discharged due to maximized rehab potential.   END OF SESSION:   PT End of Session - 06/28/21 1315     Visit Number 6    Number of Visits 9   8+eval   Date for PT Re-Evaluation 07/02/21    Authorization Type [employer] MEDICARE CHOICE PPO    Progress Note Due on Visit 10    PT Start Time 1313    PT Stop Time 1348    PT Time Calculation (min) 35 min    Equipment Utilized During Treatment Gait belt    Activity Tolerance Patient tolerated treatment well;Treatment limited secondary to medical complications (Comment)    Behavior During Therapy WFL for tasks assessed/performed             Past Medical History:  Diagnosis Date   Abnormal Pap smear 1975-76   Acute deep vein thrombosis (DVT) of left peroneal vein (HCC)    Anemia    history of   Arthritis    spine   Breast cyst, left 1980   Cystocele 2012   Diverticulosis    with h/o Diverticulitis   DVT, lower extremity, distal, chronic ([place]) 12/24/2018   GERD (gastroesophageal reflux disease)    H/O hypercholesterolemia    H/O osteopenia    H/O varicella    H/O varicose veins    Hx of Mumps    Macular degeneration     Seasonal allergies    TIA (transient ischemic attack)    Urge incontinence 2012   Urinary frequency 2010   Past Surgical History:  Procedure Laterality Date   ABDOMINAL HYSTERECTOMY     BLADDER SUSPENSION N/A 06/16/2016   Procedure: TRANSVAGINAL TAPE (TVT) PROCEDURE;  Surgeon: Everett Graff, MD;  Location: [facility];  Service: Gynecology;  Laterality: N/A;   BREAST CYST ASPIRATION  1964   COLONOSCOPY     Dr. Jodene Nam REPAIR N/A 06/16/2016   Procedure: ANTERIOR REPAIR (CYSTOCELE);  Surgeon: Everett Graff, MD;  Location: [facility];  Service: Gynecology;  Laterality: N/A;   CYSTOSCOPY N/A 06/16/2016   Procedure: CYSTOSCOPY;  Surgeon: Everett Graff, MD;  Location: [facility];  Service: Gynecology;  Laterality: N/A;  see anterior repair   Lower Extremity Venous Dopplers  01/09/2012   Right and left lower steroids: No evidence of thrombus or, thrombophlebitis; right and left GSV and SSV: No venous insufficiency. Normal exam.   NM MYOVIEW LTD  11/2016   6.6 METS.  Reached 106% of max.  Heart rate.  Walk for 4:40 min.  EF 72%.  Normal blood pressure response.  upsloping ST segment depression, nonspecific.  Otherwise normal study.  LOW RISK.  No evidence of ischemia or infarction.   TRANSTHORACIC ECHOCARDIOGRAM  01/2018   EF > 65%. LV size small. Gr 1  DD (normal for age).   Normal atrial size. Moderate Aortic Sclerosis without Stenosis. Moderate Mitral Annular Calcification with mild MR & no MS   Patient Active Problem List   Diagnosis Date Noted   History of DVT of lower extremity 04/04/2021   Chronic kidney disease, stage 3a ([place]) 04/04/2021   AKI (acute kidney injury) ([place]) 04/04/2021   Unintentional weight loss 04/04/2021   Dyslipidemia, goal LDL below 100 04/12/2020   Palpitations 05/30/2018   Irregular heartbeat 03/02/2018   TIA (transient ischemic attack) 02/15/2018   Urinary, incontinence, stress female 06/16/2016   Right carotid bruit 12/23/2014   Exertional dyspnea 12/24/2013    Bilateral arm numbness and tingling while sleeping - and shortly after waking 12/24/2013   Poor balance 12/24/2013   Edema of left lower extremity 12/21/2012   Essential hypertension    H/O hypercholesterolemia    Osteoporosis - of the spine 09/22/2011    REFERRING DIAG: G45.9 (ICD-10-CM) - Transient cerebral ischemic attack, unspecified   THERAPY DIAG:  Unsteadiness on feet  Muscle weakness (generalized)  Other abnormalities of gait and mobility  PERTINENT HISTORY: 04/04/2021-TIA presented to [facility], per ED note:  "88 year old female presents with acute onset of aphasia was last for several hours.  Had associated diffuse weakness throughout her body.  Some headache but no facial droop.  Symptoms resolved spontaneously"   PMH:   HTN, hypercholestremia, LLE edema, spinal osteoporosis-not on fosamax, R carotid bruit, urinary stress incontinence, exertional dyspnea, AKI 04/04/2021, hx of DVT, irregular heartbeat   Pt known to clinic prior from June 2021 for dizziness post-COVID. (Saw Letta Moynahan)  PRECAUTIONS: Fall  SUBJECTIVE: States she saw MD recently (Dr. Leta Baptist) and has upcoming MRI for possible lumbar issue related to left leg pain and heaviness.  She states she is having intermittent shortness of breath that is improving from prior visit.  PAIN:  Are you having pain? No  OBJECTIVE: TODAY'S TREATMENT:  VITALS (Onset of Session): Today's Vitals   06/28/21 1320  BP: 118/76  Pulse: 82   SpO2:  97% on room air  STAIRS:  Level of Assistance: Complete Independence and SBA  Stair Negotiation Technique: Step to Pattern Alternating Pattern  with Single Rail on Left  Number of Stairs: 12   Height of Stairs: 6"  Comments: Pt attempts stair w/o rail on initial bout w/ step-to pattern requiring SBA due to decreased steadiness.  On second and third attempts pt manages stairs w/ alternating pattern and left rail with improved steadiness of gait.  GAIT: Gait pattern: step  through pattern, decreased arm swing- Right, decreased arm swing- Left, and decreased stride length Distance walked: 690' Assistive device utilized: None Level of assistance: Complete Independence Comments: Pt ambulates with head turns and hand movements while speaking to therapist.  No overt LOB, pt states she can feel LLE stiffen up requiring sitting.  Verbally reviewed HEP w/ pt stating they remain challenging. Assessed 5xSTS:  w/o UE support 12.13 sec from standard chair Assessed FGA:  [facility] PT Assessment - 06/28/21 1340       Functional Gait  Assessment   Gait assessed  Yes    Gait Level Surface Walks 20 ft in less than 5.5 sec, no assistive devices, good speed, no evidence for imbalance, normal gait pattern, deviates no more than 6 in outside of the 12 in walkway width.    Change in Gait Speed Able to smoothly change walking speed without loss of balance or gait deviation. Deviate no more than 6  in outside of the 12 in walkway width.    Gait with Horizontal Head Turns Performs head turns smoothly with no change in gait. Deviates no more than 6 in outside 12 in walkway width    Gait with Vertical Head Turns Performs head turns with no change in gait. Deviates no more than 6 in outside 12 in walkway width.    Gait and Pivot Turn Pivot turns safely in greater than 3 sec and stops with no loss of balance, or pivot turns safely within 3 sec and stops with mild imbalance, requires small steps to catch balance.    Step Over Obstacle Is able to step over 2 stacked shoe boxes taped together (9 in total height) without changing gait speed. No evidence of imbalance.    Gait with Narrow Base of Support Ambulates 4-7 steps.    Gait with Eyes Closed Walks 20 ft, uses assistive device, slower speed, mild gait deviations, deviates 6-10 in outside 12 in walkway width. Ambulates 20 ft in less than 9 sec but greater than 7 sec.    Ambulating Backwards Walks 20 ft, uses assistive device, slower speed, mild  gait deviations, deviates 6-10 in outside 12 in walkway width.    Steps Alternating feet, must use rail.    Total Score 24    FGA comment: medium fall risk            PATIENT EDUCATION: Education details: Continue HEP and initiation of formal walking program.  Printed instructions for walking program for patient. Person educated: Patient Education method: Explanation Education comprehension: verbalized understanding and needs further education   HOME EXERCISE PROGRAM: Access Code: [email or web address] URL: [email or web address] Date: 05/11/2021 Prepared by: Elease Etienne  Exercises - Tandem Stance with Eyes Closed in Corner  - 1 x daily - 5 x weekly - 1 sets - 2 reps - 30 seconds hold -Modified to semi-tandem - Standing Near Stance in Corner with Eyes Closed  - 1 x daily - 5 x weekly - 1 sets - 2 reps - 30 seconds hold - Tandem Stance in Corner  - 1 x daily - 5 x weekly - 1 sets - 2 reps - 30 seconds hold - Heel Raises with Counter Support  - 1 x daily - 5 x weekly - 2 sets - 10 reps - Standing March with Counter Support  - 1 x daily - 5 x weekly - 2 sets - 20 reps - Tandem Walking with Counter Support  - 1 x daily - 5 x weekly - 3 sets - 10 reps  *Edu to use back of chair in front during corner balance exercises for safety using fingertip touch as needed.  - Side Stepping with Resistance at Thighs and Counter Support  - 1 x daily - 5 x weekly - 3 sets - 10 reps   Walking program:  Begin walking 5 minutes a day 3-4 days a week.  Increase the time you are walking each day by 1-2 minutes the following week.  Be sure to wear shoes the will not slip off when walking and walk on level surfaces that are well lit.  Rest if you feel fatigued and pace your walking so that you do not become short of breath.  GOALS: Goals reviewed with patient? Yes   SHORT TERM GOALS: Target date:  06/04/2021   Pt will be independent with initial strength and balance HEP. Baseline:  Established,  reviewed 06/15/2021. Goal status: ONGOING   2.  Pt will demonstrate  TUG of <13 seconds in order to decrease risk of falls and improve functional mobility using LRAD. Baseline: 13.64 sec; 19.61 seconds 06/15/2021 Goal status: NOT MET   3.  Pt will demonstrate a gait speed of >3.33 feet/sec in order to decrease risk for falls. Baseline: 3.06 ft/sec; 2.54 ft/sec no AD 06/15/2021 Goal status: NOT MET   4.  DGI to be assessed with LTG set as appropriate. Baseline: FGA assessed due to pt functional status. Goal status: REVISED (session prior); MET     LONG TERM GOALS: Target date:  07/02/2021   Pt will be independent with finalized strength and balance HEP. Baseline: Established and modified and reviewed w/ pt. Goal status: MET   2.  Pt will ambulate >750 feet supervision level w/o AD and no overt LOB to promote safe return to prior level of Baseline: 366' w/o AD supervision level; 690' independently Goal status: PARTIALLY MET   3.  Pt will decrease 5xSTS to <13 seconds in order to demonstrate decreased risk for falls and improved functional bilateral LE strength and power. Baseline: 16.28sec, pt winded following; 12.13 sec w/o UE support Goal status: MET   4.  Pt will improve FGA score to >/=25/30 in order to demonstrate improved balance and decreased fall risk. Baseline: 20/30; 24/30 Goal status: PARTIALLY MET   ASSESSMENT:   CLINICAL IMPRESSION: Long-term goals assessed this visit with patient maintaining compliance to modified HEP and receptive to establishment of walking program today.  She progressed her 5xSTS without use of UE support to 12.13 seconds from a standard chair demonstrating improved functional LE strength.  She improved her ambulation tolerance to 690' independently prior to onset of LLE symptoms.  Her FGA score improved from 20/30 to 24/30 just shy of goal level but demonstrating improvement in several areas of dynamic balance including stepping over obstacles and  ambulating with narrowed BOS.  At this time she is in agreement to discharge.     OBJECTIVE IMPAIRMENTS Abnormal gait, decreased activity tolerance, decreased balance, decreased endurance, decreased knowledge of condition, decreased knowledge of use of DME, and decreased strength.    ACTIVITY LIMITATIONS community activity.    PERSONAL FACTORS Age, Past/current experiences, and 3+ comorbidities: HTN. hypercholestremia, LLE edema, spinal osteoporosis-not on fosamax, R carotid bruit, urinary stress incontinence, exertional dyspnea, AKI 04/04/2021, hx of DVT, irregular heartbeat  are also affecting patient's functional outcome.      REHAB POTENTIAL: Good   CLINICAL DECISION MAKING: Evolving/moderate complexity   EVALUATION COMPLEXITY: Moderate   PLAN: PT FREQUENCY: 1x/week   PT DURATION: 8 weeks   PLANNED INTERVENTIONS: Therapeutic exercises, Therapeutic activity, Neuromuscular re-education, Balance training, Gait training, Patient/Family education, Stair training, Vestibular training, Canalith repositioning, DME instructions, and Manual therapy   PLAN FOR NEXT SESSION:  N/A    Bary Richard, PT, DPT 06/28/2021, 2:00 PM

## 2021-06-28 NOTE — Patient Instructions (Signed)
Walking program:  Begin walking 5 minutes a day 3-4 days a week.  Increase the time you are walking each day by 1-2 minutes the following week.  Be sure to wear shoes the will not slip off when walking and walk on level surfaces that are well lit.  Rest if you feel fatigued and pace your walking so that you do not become short of breath.

## 2021-06-29 ENCOUNTER — Telehealth: Payer: Self-pay | Admitting: Diagnostic Neuroimaging

## 2021-06-29 NOTE — Telephone Encounter (Signed)
[employer] Auth: [id number] exp. 06/29/21-07/31/21 sent to GI

## 2021-07-05 ENCOUNTER — Ambulatory Visit: Payer: Medicare PPO | Admitting: Physical Therapy

## 2021-07-12 ENCOUNTER — Ambulatory Visit
Admission: RE | Admit: 2021-07-12 | Discharge: 2021-07-12 | Disposition: A | Discharge status: Home / Self Care | Payer: Medicare PPO | Source: Ambulatory Visit | Attending: Diagnostic Neuroimaging | Admitting: Diagnostic Neuroimaging

## 2021-07-12 DIAGNOSIS — R200 Anesthesia of skin: Secondary | ICD-10-CM | POA: Diagnosis not present

## 2021-07-12 DIAGNOSIS — M545 Low back pain, unspecified: Secondary | ICD-10-CM | POA: Diagnosis not present

## 2021-07-12 DIAGNOSIS — R29898 Other symptoms and signs involving the musculoskeletal system: Secondary | ICD-10-CM

## 2021-07-14 ENCOUNTER — Encounter (INDEPENDENT_AMBULATORY_CARE_PROVIDER_SITE_OTHER): Payer: Medicare PPO | Admitting: Ophthalmology

## 2021-07-14 DIAGNOSIS — I10 Essential (primary) hypertension: Secondary | ICD-10-CM

## 2021-07-14 DIAGNOSIS — H43813 Vitreous degeneration, bilateral: Secondary | ICD-10-CM

## 2021-07-14 DIAGNOSIS — H353231 Exudative age-related macular degeneration, bilateral, with active choroidal neovascularization: Secondary | ICD-10-CM

## 2021-07-14 DIAGNOSIS — H35033 Hypertensive retinopathy, bilateral: Secondary | ICD-10-CM

## 2021-07-20 ENCOUNTER — Ambulatory Visit (INDEPENDENT_AMBULATORY_CARE_PROVIDER_SITE_OTHER): Payer: Medicare PPO

## 2021-07-20 ENCOUNTER — Ambulatory Visit (HOSPITAL_COMMUNITY)
Admission: EM | Admit: 2021-07-20 | Discharge: 2021-07-20 | Disposition: A | Discharge status: Home / Self Care | Payer: Medicare PPO | Attending: Internal Medicine | Admitting: Internal Medicine

## 2021-07-20 ENCOUNTER — Encounter (HOSPITAL_COMMUNITY): Payer: Self-pay | Admitting: Emergency Medicine

## 2021-07-20 DIAGNOSIS — S0990XA Unspecified injury of head, initial encounter: Secondary | ICD-10-CM

## 2021-07-20 DIAGNOSIS — M25532 Pain in left wrist: Secondary | ICD-10-CM | POA: Diagnosis not present

## 2021-07-20 DIAGNOSIS — W19XXXA Unspecified fall, initial encounter: Secondary | ICD-10-CM

## 2021-07-20 DIAGNOSIS — M79642 Pain in left hand: Secondary | ICD-10-CM | POA: Diagnosis not present

## 2021-07-20 DIAGNOSIS — M7989 Other specified soft tissue disorders: Secondary | ICD-10-CM | POA: Diagnosis not present

## 2021-07-20 NOTE — Discharge Instructions (Signed)
Your x-rays were normal.  A wrist brace has been applied.  Please do not bear any weight, push or pull, or lift anything with that wrist or hand.  Apply ice to affected area.  Please follow-up with provided contact information for orthopedist for further evaluation and management tomorrow.

## 2021-07-20 NOTE — ED Triage Notes (Signed)
Pt reports tripped over stool in front of couch and fell. Reports swelling and bruising to left wrist. Reports did hit head on floor, denies LOC. Reports takes daily ASA.

## 2021-07-20 NOTE — ED Provider Notes (Signed)
[place]    CSN: [id number] Arrival date & time: 07/20/21  1332      History   Chief Complaint Chief Complaint  Patient presents with   Fall   Wrist Injury    HPI [name] is a 88 y.o. female.   Patient presents for further evaluation of the left wrist pain.  Patient states that she tripped over a stool in her kitchen and fell backwards.  She did hit her head but denies loss of consciousness.  She denies headache, blurred vision, dizziness, nausea, vomiting.  She does state that she has macular degeneration but denies any blurred vision different from baseline.  She had 1 episode of dizziness or after fall but reports that this has resolved.  Only pain that is occurring currently is left wrist pain.  She states that she is not sure how she hurt her wrist but states that she just simply fell backwards.  She does take daily aspirin but denies taking any other blood thinning medications.  She has applied ice to her wrist with some improvement in swelling.   Fall  Wrist Injury   Past Medical History:  Diagnosis Date   Abnormal Pap smear 1975-76   Acute deep vein thrombosis (DVT) of left peroneal vein (HCC)    Anemia    history of   Arthritis    spine   Breast cyst, left 1980   Cystocele 2012   Diverticulosis    with h/o Diverticulitis   DVT, lower extremity, distal, chronic ([facility]) 12/24/2018   GERD (gastroesophageal reflux disease)    H/O hypercholesterolemia    H/O osteopenia    H/O varicella    H/O varicose veins    Hx of Mumps    Macular degeneration    Seasonal allergies    TIA (transient ischemic attack)    Urge incontinence 2012   Urinary frequency 2010    Patient Active Problem List   Diagnosis Date Noted   History of DVT of lower extremity 04/04/2021   Chronic kidney disease, stage 3a ([facility]) 04/04/2021   AKI (acute kidney injury) ([facility]) 04/04/2021   Unintentional weight loss 04/04/2021   Dyslipidemia, goal LDL below 100 04/12/2020    Palpitations 05/30/2018   Irregular heartbeat 03/02/2018   TIA (transient ischemic attack) 02/15/2018   Urinary, incontinence, stress female 06/16/2016   Right carotid bruit 12/23/2014   Exertional dyspnea 12/24/2013   Bilateral arm numbness and tingling while sleeping - and shortly after waking 12/24/2013   Poor balance 12/24/2013   Edema of left lower extremity 12/21/2012   Essential hypertension    H/O hypercholesterolemia    Osteoporosis - of the spine 09/22/2011    Past Surgical History:  Procedure Laterality Date   ABDOMINAL HYSTERECTOMY     BLADDER SUSPENSION N/A 06/16/2016   Procedure: TRANSVAGINAL TAPE (TVT) PROCEDURE;  Surgeon: Everett Graff, MD;  Location: [facility];  Service: Gynecology;  Laterality: N/A;   BREAST CYST ASPIRATION  1964   COLONOSCOPY     Dr. Jodene Nam REPAIR N/A 06/16/2016   Procedure: ANTERIOR REPAIR (CYSTOCELE);  Surgeon: Everett Graff, MD;  Location: [facility];  Service: Gynecology;  Laterality: N/A;   CYSTOSCOPY N/A 06/16/2016   Procedure: CYSTOSCOPY;  Surgeon: Everett Graff, MD;  Location: [facility];  Service: Gynecology;  Laterality: N/A;  see anterior repair   Lower Extremity Venous Dopplers  01/09/2012   Right and left lower steroids: No evidence of thrombus or, thrombophlebitis; right and left GSV and  SSV: No venous insufficiency. Normal exam.   NM MYOVIEW LTD  11/2016   6.6 METS.  Reached 106% of max.  Heart rate.  Walk for 4:40 min.  EF 72%.  Normal blood pressure response.  upsloping ST segment depression, nonspecific.  Otherwise normal study.  LOW RISK.  No evidence of ischemia or infarction.   TRANSTHORACIC ECHOCARDIOGRAM  01/2018   EF > 65%. LV size small. Gr 1 DD (normal for age).   Normal atrial size. Moderate Aortic Sclerosis without Stenosis. Moderate Mitral Annular Calcification with mild MR & no MS    OB History     Gravida  3   Para  3   Term  3   Preterm      AB      Living  3      SAB      IAB      Ectopic       Multiple      Live Births  3            Home Medications    Prior to Admission medications   Medication Sig Start Date End Date Taking? Authorizing Provider  acetaminophen (TYLENOL) 500 MG tablet Take 500-1,000 mg by mouth every 8 (eight) hours as needed for mild pain.     [provider]  albuterol (VENTOLIN HFA) 108 (90 Base) MCG/ACT inhaler Inhale 2 puffs into the lungs every 4 (four) hours as needed for wheezing or shortness of breath.  01/30/19   [provider]  aspirin EC 81 MG EC tablet Take 1 tablet (81 mg total) by mouth daily. Swallow whole. 04/06/21   Pokhrel, Corrie Mckusick, MD  Cholecalciferol (VITAMIN D-3) 25 MCG (1000 UT) CAPS Take 1,000 Units by mouth daily with breakfast.     [provider]  ezetimibe (ZETIA) 10 MG tablet Take 10 mg by mouth daily. 02/05/20   [provider]  Lactobacillus Rhamnosus, GG, (CULTURELLE) CAPS Take 1 capsule by mouth every morning.     [provider]  Magnesium 250 MG TABS Take 250 mg by mouth daily as needed (leg cramps).    [provider]  metoprolol succinate (TOPROL-XL) 25 MG 24 hr tablet Take 25 mg by mouth daily. 06/04/21   [provider]  Multiple Vitamins-Minerals (AIRBORNE PO) Take 1 tablet by mouth daily with breakfast.    [provider]  Multiple Vitamins-Minerals (CENTRUM SILVER ULTRA WOMENS) TABS Take 1 tablet by mouth daily with breakfast.     [provider]  pravastatin (PRAVACHOL) 20 MG tablet Take 2 tablets (40 mg total) by mouth daily. Patient taking differently: Take 40 mg by mouth daily. Pt states she takes medication as needed 04/05/21   Pokhrel, Corrie Mckusick, MD  Saccharomyces boulardii (PROBIOTIC) 250 MG CAPS Take by mouth daily. Pt isn't sure of the dosage    [provider]  Trospium Chloride 60 MG CP24 Take 1 capsule by mouth daily. 03/18/21   [provider]  vitamin B-12 (CYANOCOBALAMIN) 100 MCG tablet Take 100 mcg by mouth  daily.    [provider]    Family History Family History  Problem Relation Age of Onset   Uterine cancer Mother 27   Heart attack Brother 2   Breast cancer Daughter    Stroke Neg Hx     Social History Social History   Tobacco Use   Smoking status: Never   Smokeless tobacco: Never  Vaping Use   Vaping Use: Never used  Substance  Use Topics   Alcohol use: No   Drug use: No     Allergies   Atorvastatin, Desmopressin acetate, Iodinated contrast media, Myrbetriq [mirabegron], and Iodine   Review of Systems Review of Systems Per HPI  Physical Exam Triage Vital Signs ED Triage Vitals  Enc Vitals Group     BP 07/20/21 1405 (!) 154/79     Pulse Rate 07/20/21 1405 88     Resp 07/20/21 1405 19     Temp 07/20/21 1405 98.9 F (37.2 C)     Temp Source 07/20/21 1405 Oral     SpO2 07/20/21 1405 98 %     Weight --      Height --      Head Circumference --      Peak Flow --      Pain Score 07/20/21 1404 3     Pain Loc --      Pain Edu? --      Excl. in [place]? --    No data found.  Updated Vital Signs BP (!) 154/79 (BP Location: Right Arm)   Pulse 88   Temp 98.9 F (37.2 C) (Oral)   Resp 19   SpO2 98%   Visual Acuity Right Eye Distance:   Left Eye Distance:   Bilateral Distance:    Right Eye Near:   Left Eye Near:    Bilateral Near:     Physical Exam Constitutional:      General: She is not in acute distress.    Appearance: Normal appearance. She is not toxic-appearing or diaphoretic.  HENT:     Head: Normocephalic and atraumatic.  Eyes:     Extraocular Movements: Extraocular movements intact.     Conjunctiva/sclera: Conjunctivae normal.     Pupils: Pupils are equal, round, and reactive to light.  Pulmonary:     Effort: Pulmonary effort is normal.  Musculoskeletal:     Comments: Bruising with associated mild swelling to dorsal surface of left wrist.  Patient also has tenderness to palpation to left first digit.  Grip strength 5/5.   Neurovascular intact.  No obvious lacerations or abrasions noted.  Has full range of motion of wrist, hand, fingers.  Neurological:     General: No focal deficit present.     Mental Status: She is alert and oriented to person, place, and time. Mental status is at baseline.     Cranial Nerves: Cranial nerves 2-12 are intact.     Sensory: Sensation is intact.     Motor: Motor function is intact.     Coordination: Coordination is intact.     Gait: Gait is intact.     Comments: No obvious lacerations, abrasions, swelling noted to head.  Psychiatric:        Mood and Affect: Mood normal.        Behavior: Behavior normal.        Thought Content: Thought content normal.        Judgment: Judgment normal.      UC Treatments / Results  Labs (all labs ordered are listed, but only abnormal results are displayed) Labs Reviewed - No data to display  EKG   Radiology DG Hand Complete Left  Result Date: 07/20/2021 CLINICAL DATA:  Fall, hand pain EXAM: LEFT HAND - COMPLETE 3+ VIEW COMPARISON:  None Available. FINDINGS: No acute fracture or dislocation identified in the hand. Minimal arthritic changes in the distal interphalangeal joints. Soft tissue swelling of the wrist. No significant soft tissue swelling in  the hand. IMPRESSION: No acute osseous abnormality identified. Soft tissue swelling of the wrist. Electronically Signed   By: Ofilia Neas M.D.   On: 07/20/2021 14:49   DG Wrist Complete Left  Result Date: 07/20/2021 CLINICAL DATA:  Fall, wrist pain EXAM: LEFT WRIST - COMPLETE 3+ VIEW COMPARISON:  None Available. FINDINGS: No fracture or dislocation identified. There is no evidence of arthropathy or other focal bone abnormality. Significant soft tissue swelling dorsally. IMPRESSION: Significant soft tissue swelling of the dorsal wrist with no acute fracture visualized. Consider follow-up x-ray if symptoms persist. Electronically Signed   By: Ofilia Neas M.D.   On: 07/20/2021 14:47     Procedures Procedures (including critical care time)  Medications Ordered in UC Medications - No data to display  Initial Impression / Assessment and Plan / UC Course  I have reviewed the triage vital signs and the nursing notes.  Pertinent labs & imaging results that were available during my care of the patient were reviewed by me and considered in my medical decision making (see chart for details).     Patient was originally advised to go to the hospital for further evaluation and management given that she hit her head during fall.  Patient refused to go to the hospital.  Risks associated with not going to hospital were discussed with patient.  Patient voiced understanding. Advised patient that I do not have CT capabilities in urgent care.  X-rays are negative for any acute bony abnormality.  Radiologist recommended follow-up x-rays if symptoms persist.  Wrist brace was applied given swelling noted on physical exam.  Patient was also advised to follow-up with hand specialty at provided contact information for [facility] for further evaluation and management tomorrow as soon as possible.  Advised ice application.  Discussed pain relieving medications with patient as well.  Advised of nonweightbearing or lifting until otherwise advised by orthopedist.  No obvious abnormalities to head.  Neuro exam is normal.  Discussed strict return and ER precautions.  Patient verbalized understanding and was agreeable with plan. Final Clinical Impressions(s) / UC Diagnoses   Final diagnoses:  Fall, initial encounter  Injury of head, initial encounter  Left wrist pain     Discharge Instructions      Your x-rays were normal.  A wrist brace has been applied.  Please do not bear any weight, push or pull, or lift anything with that wrist or hand.  Apply ice to affected area.  Please follow-up with provided contact information for orthopedist for further evaluation and management tomorrow.     ED  Prescriptions   None    PDMP not reviewed this encounter.   Teodora Medici, [place] 07/20/21 [phone]

## 2021-07-23 DIAGNOSIS — S60222A Contusion of left hand, initial encounter: Secondary | ICD-10-CM | POA: Diagnosis not present

## 2021-07-27 ENCOUNTER — Telehealth: Payer: Self-pay | Admitting: Neurology

## 2021-07-27 ENCOUNTER — Telehealth: Payer: Self-pay | Admitting: Diagnostic Neuroimaging

## 2021-07-27 DIAGNOSIS — Z8673 Personal history of transient ischemic attack (TIA), and cerebral infarction without residual deficits: Secondary | ICD-10-CM | POA: Diagnosis not present

## 2021-07-27 DIAGNOSIS — R030 Elevated blood-pressure reading, without diagnosis of hypertension: Secondary | ICD-10-CM | POA: Diagnosis not present

## 2021-07-27 DIAGNOSIS — S60212A Contusion of left wrist, initial encounter: Secondary | ICD-10-CM | POA: Diagnosis not present

## 2021-07-27 DIAGNOSIS — W19XXXD Unspecified fall, subsequent encounter: Secondary | ICD-10-CM | POA: Diagnosis not present

## 2021-07-27 DIAGNOSIS — R200 Anesthesia of skin: Secondary | ICD-10-CM | POA: Diagnosis not present

## 2021-07-27 DIAGNOSIS — R54 Age-related physical debility: Secondary | ICD-10-CM | POA: Diagnosis not present

## 2021-07-27 DIAGNOSIS — S20112A Abrasion of breast, left breast, initial encounter: Secondary | ICD-10-CM | POA: Diagnosis not present

## 2021-07-27 NOTE — Telephone Encounter (Signed)
Please call patient, MRI of lumbar showed multilevel degenerative changes, most obvious at L3 and 4, no evidence of significant canal stenosis or foraminal, less likely cause nerve compression, no significant change compared to previous scan in 2016  In addition there is evidence of degenerative changes at the bottom of T12, which can potentially cause back pain, but would not explain her leg weakness     1. Grade 1 anterolisthesis of L3 on L4 with associated advanced facet arthropathy resulting in mild narrowing of the bilateral subarticular zones without evidence of frank nerve root impingement, and no significant neural foraminal stenosis, not significantly changed since the study from 2016. 2. Prominent Schmorl's node indenting the T12 endplate with associated edema, which could reflect a source of pain. 3. Otherwise, overall mild for age degenerative changes throughout the remainder of the lumbar spine as above without other significant spinal canal or neural foraminal stenosis, overall not significantly changed since 2016.

## 2021-07-27 NOTE — Telephone Encounter (Signed)
Pt checking on MRI results. Would like a call from the nurse. Please call later this afternoon.

## 2021-07-27 NOTE — Telephone Encounter (Signed)
I called pt back and we discussed message and resulted by work in doctor.  Pt verbalized understanding and appreciation for the call. She sts leg weakness continues to be an issue and would like to know Dr. Gladstone Lighter recommendation going forward.   St she was released from physical therapy and on 07/20/2021 went to urgent care due to a fall. I advised Dr. Leta Baptist will be back in the office tomorrow and will fwd for him to review.

## 2021-07-27 NOTE — Telephone Encounter (Signed)
I have sent message to work in doctor( Dr. Krista Blue) to review result since Dr. Leta Baptist is out of the office.

## 2021-07-27 NOTE — Telephone Encounter (Signed)
Attempted to reach the pt. Spoke with husband who states the pt ws out at present.   Will try and call back at a later time.

## 2021-07-28 DIAGNOSIS — Z809 Family history of malignant neoplasm, unspecified: Secondary | ICD-10-CM | POA: Diagnosis not present

## 2021-07-28 DIAGNOSIS — Z7982 Long term (current) use of aspirin: Secondary | ICD-10-CM | POA: Diagnosis not present

## 2021-07-28 DIAGNOSIS — N3281 Overactive bladder: Secondary | ICD-10-CM | POA: Diagnosis not present

## 2021-07-28 DIAGNOSIS — N1831 Chronic kidney disease, stage 3a: Secondary | ICD-10-CM | POA: Diagnosis not present

## 2021-07-28 DIAGNOSIS — I129 Hypertensive chronic kidney disease with stage 1 through stage 4 chronic kidney disease, or unspecified chronic kidney disease: Secondary | ICD-10-CM | POA: Diagnosis not present

## 2021-07-28 DIAGNOSIS — N390 Urinary tract infection, site not specified: Secondary | ICD-10-CM | POA: Diagnosis not present

## 2021-07-28 DIAGNOSIS — N3941 Urge incontinence: Secondary | ICD-10-CM | POA: Diagnosis not present

## 2021-07-28 DIAGNOSIS — E785 Hyperlipidemia, unspecified: Secondary | ICD-10-CM | POA: Diagnosis not present

## 2021-07-28 DIAGNOSIS — M1990 Unspecified osteoarthritis, unspecified site: Secondary | ICD-10-CM | POA: Diagnosis not present

## 2021-08-02 NOTE — Telephone Encounter (Signed)
Penumalli, Catherine Polka, MD  You 4 days ago   Unfortunately nothing further I can recommend. Continue to use fall precautions, cane, walker. -VRP   I called the pt and relayed information; she verbalized understanding and appreciation for the call.

## 2021-08-04 DIAGNOSIS — N6320 Unspecified lump in the left breast, unspecified quadrant: Secondary | ICD-10-CM | POA: Diagnosis not present

## 2021-08-04 DIAGNOSIS — N644 Mastodynia: Secondary | ICD-10-CM | POA: Diagnosis not present

## 2021-08-11 ENCOUNTER — Encounter (INDEPENDENT_AMBULATORY_CARE_PROVIDER_SITE_OTHER): Payer: Medicare PPO | Admitting: Ophthalmology

## 2021-08-11 DIAGNOSIS — H353231 Exudative age-related macular degeneration, bilateral, with active choroidal neovascularization: Secondary | ICD-10-CM

## 2021-08-11 DIAGNOSIS — H35033 Hypertensive retinopathy, bilateral: Secondary | ICD-10-CM | POA: Diagnosis not present

## 2021-08-11 DIAGNOSIS — I10 Essential (primary) hypertension: Secondary | ICD-10-CM

## 2021-08-11 DIAGNOSIS — H43813 Vitreous degeneration, bilateral: Secondary | ICD-10-CM | POA: Diagnosis not present

## 2021-08-16 DIAGNOSIS — H04123 Dry eye syndrome of bilateral lacrimal glands: Secondary | ICD-10-CM | POA: Diagnosis not present

## 2021-08-25 DIAGNOSIS — R928 Other abnormal and inconclusive findings on diagnostic imaging of breast: Secondary | ICD-10-CM | POA: Diagnosis not present

## 2021-08-25 DIAGNOSIS — N6320 Unspecified lump in the left breast, unspecified quadrant: Secondary | ICD-10-CM | POA: Diagnosis not present

## 2021-09-13 ENCOUNTER — Encounter (INDEPENDENT_AMBULATORY_CARE_PROVIDER_SITE_OTHER): Payer: Medicare PPO | Admitting: Ophthalmology

## 2021-09-13 DIAGNOSIS — I10 Essential (primary) hypertension: Secondary | ICD-10-CM | POA: Diagnosis not present

## 2021-09-13 DIAGNOSIS — H35033 Hypertensive retinopathy, bilateral: Secondary | ICD-10-CM

## 2021-09-13 DIAGNOSIS — H43813 Vitreous degeneration, bilateral: Secondary | ICD-10-CM | POA: Diagnosis not present

## 2021-09-13 DIAGNOSIS — H353231 Exudative age-related macular degeneration, bilateral, with active choroidal neovascularization: Secondary | ICD-10-CM

## 2021-09-21 DIAGNOSIS — M545 Low back pain, unspecified: Secondary | ICD-10-CM | POA: Diagnosis not present

## 2021-09-27 DIAGNOSIS — M47816 Spondylosis without myelopathy or radiculopathy, lumbar region: Secondary | ICD-10-CM | POA: Diagnosis not present

## 2021-09-27 DIAGNOSIS — M5136 Other intervertebral disc degeneration, lumbar region: Secondary | ICD-10-CM | POA: Diagnosis not present

## 2021-10-11 ENCOUNTER — Encounter (INDEPENDENT_AMBULATORY_CARE_PROVIDER_SITE_OTHER): Payer: Medicare PPO | Admitting: Ophthalmology

## 2021-10-11 DIAGNOSIS — H353231 Exudative age-related macular degeneration, bilateral, with active choroidal neovascularization: Secondary | ICD-10-CM

## 2021-10-11 DIAGNOSIS — I10 Essential (primary) hypertension: Secondary | ICD-10-CM | POA: Diagnosis not present

## 2021-10-11 DIAGNOSIS — H43813 Vitreous degeneration, bilateral: Secondary | ICD-10-CM

## 2021-10-11 DIAGNOSIS — H35033 Hypertensive retinopathy, bilateral: Secondary | ICD-10-CM | POA: Diagnosis not present

## 2021-10-12 DIAGNOSIS — M47816 Spondylosis without myelopathy or radiculopathy, lumbar region: Secondary | ICD-10-CM | POA: Diagnosis not present

## 2021-10-18 ENCOUNTER — Encounter (HOSPITAL_COMMUNITY): Payer: Self-pay | Admitting: Emergency Medicine

## 2021-10-18 ENCOUNTER — Ambulatory Visit (HOSPITAL_COMMUNITY)
Admission: EM | Admit: 2021-10-18 | Discharge: 2021-10-18 | Disposition: A | Discharge status: Home / Self Care | Payer: Medicare PPO

## 2021-10-18 ENCOUNTER — Other Ambulatory Visit: Payer: Self-pay

## 2021-10-18 ENCOUNTER — Emergency Department (HOSPITAL_COMMUNITY): Payer: Medicare PPO

## 2021-10-18 ENCOUNTER — Inpatient Hospital Stay (HOSPITAL_COMMUNITY)
Admission: EM | Admit: 2021-10-18 | Discharge: 2021-10-22 | DRG: 682 | Disposition: A | Discharge status: Home Health | Payer: Medicare PPO | Attending: Internal Medicine | Admitting: Internal Medicine

## 2021-10-18 DIAGNOSIS — I10 Essential (primary) hypertension: Secondary | ICD-10-CM | POA: Diagnosis not present

## 2021-10-18 DIAGNOSIS — R0789 Other chest pain: Secondary | ICD-10-CM | POA: Diagnosis not present

## 2021-10-18 DIAGNOSIS — K225 Diverticulum of esophagus, acquired: Secondary | ICD-10-CM | POA: Diagnosis not present

## 2021-10-18 DIAGNOSIS — I700 Atherosclerosis of aorta: Secondary | ICD-10-CM | POA: Diagnosis not present

## 2021-10-18 DIAGNOSIS — Z8049 Family history of malignant neoplasm of other genital organs: Secondary | ICD-10-CM

## 2021-10-18 DIAGNOSIS — E7800 Pure hypercholesterolemia, unspecified: Secondary | ICD-10-CM | POA: Diagnosis present

## 2021-10-18 DIAGNOSIS — Z888 Allergy status to other drugs, medicaments and biological substances status: Secondary | ICD-10-CM

## 2021-10-18 DIAGNOSIS — R7989 Other specified abnormal findings of blood chemistry: Secondary | ICD-10-CM | POA: Diagnosis present

## 2021-10-18 DIAGNOSIS — K224 Dyskinesia of esophagus: Secondary | ICD-10-CM | POA: Diagnosis present

## 2021-10-18 DIAGNOSIS — R42 Dizziness and giddiness: Secondary | ICD-10-CM | POA: Diagnosis present

## 2021-10-18 DIAGNOSIS — K219 Gastro-esophageal reflux disease without esophagitis: Secondary | ICD-10-CM | POA: Diagnosis present

## 2021-10-18 DIAGNOSIS — N179 Acute kidney failure, unspecified: Principal | ICD-10-CM | POA: Diagnosis present

## 2021-10-18 DIAGNOSIS — Z803 Family history of malignant neoplasm of breast: Secondary | ICD-10-CM

## 2021-10-18 DIAGNOSIS — F419 Anxiety disorder, unspecified: Secondary | ICD-10-CM | POA: Diagnosis present

## 2021-10-18 DIAGNOSIS — Z90710 Acquired absence of both cervix and uterus: Secondary | ICD-10-CM | POA: Diagnosis not present

## 2021-10-18 DIAGNOSIS — I5033 Acute on chronic diastolic (congestive) heart failure: Secondary | ICD-10-CM | POA: Diagnosis not present

## 2021-10-18 DIAGNOSIS — E785 Hyperlipidemia, unspecified: Secondary | ICD-10-CM | POA: Diagnosis not present

## 2021-10-18 DIAGNOSIS — R0602 Shortness of breath: Secondary | ICD-10-CM | POA: Diagnosis not present

## 2021-10-18 DIAGNOSIS — R634 Abnormal weight loss: Secondary | ICD-10-CM | POA: Diagnosis present

## 2021-10-18 DIAGNOSIS — Z8619 Personal history of other infectious and parasitic diseases: Secondary | ICD-10-CM

## 2021-10-18 DIAGNOSIS — I129 Hypertensive chronic kidney disease with stage 1 through stage 4 chronic kidney disease, or unspecified chronic kidney disease: Secondary | ICD-10-CM | POA: Diagnosis present

## 2021-10-18 DIAGNOSIS — Z7982 Long term (current) use of aspirin: Secondary | ICD-10-CM

## 2021-10-18 DIAGNOSIS — N1832 Chronic kidney disease, stage 3b: Secondary | ICD-10-CM | POA: Diagnosis present

## 2021-10-18 DIAGNOSIS — R55 Syncope and collapse: Secondary | ICD-10-CM | POA: Diagnosis present

## 2021-10-18 DIAGNOSIS — Z8249 Family history of ischemic heart disease and other diseases of the circulatory system: Secondary | ICD-10-CM

## 2021-10-18 DIAGNOSIS — Y92239 Unspecified place in hospital as the place of occurrence of the external cause: Secondary | ICD-10-CM | POA: Diagnosis present

## 2021-10-18 DIAGNOSIS — Z86718 Personal history of other venous thrombosis and embolism: Secondary | ICD-10-CM | POA: Diagnosis not present

## 2021-10-18 DIAGNOSIS — Z8673 Personal history of transient ischemic attack (TIA), and cerebral infarction without residual deficits: Secondary | ICD-10-CM

## 2021-10-18 DIAGNOSIS — I4510 Unspecified right bundle-branch block: Secondary | ICD-10-CM | POA: Diagnosis present

## 2021-10-18 DIAGNOSIS — R079 Chest pain, unspecified: Secondary | ICD-10-CM

## 2021-10-18 DIAGNOSIS — Z1152 Encounter for screening for COVID-19: Secondary | ICD-10-CM

## 2021-10-18 DIAGNOSIS — Z6821 Body mass index (BMI) 21.0-21.9, adult: Secondary | ICD-10-CM

## 2021-10-18 DIAGNOSIS — R072 Precordial pain: Secondary | ICD-10-CM | POA: Diagnosis not present

## 2021-10-18 DIAGNOSIS — Z79899 Other long term (current) drug therapy: Secondary | ICD-10-CM

## 2021-10-18 DIAGNOSIS — R0609 Other forms of dyspnea: Secondary | ICD-10-CM

## 2021-10-18 DIAGNOSIS — T502X5A Adverse effect of carbonic-anhydrase inhibitors, benzothiadiazides and other diuretics, initial encounter: Secondary | ICD-10-CM | POA: Diagnosis present

## 2021-10-18 DIAGNOSIS — R2681 Unsteadiness on feet: Secondary | ICD-10-CM | POA: Diagnosis present

## 2021-10-18 DIAGNOSIS — Z91041 Radiographic dye allergy status: Secondary | ICD-10-CM

## 2021-10-18 LAB — CBC
HCT: 31.3 % — ABNORMAL LOW (ref 36.0–46.0)
Hemoglobin: 11.7 g/dL — ABNORMAL LOW (ref 12.0–15.0)
MCH: 31.5 pg (ref 26.0–34.0)
MCHC: 37.4 g/dL — ABNORMAL HIGH (ref 30.0–36.0)
MCV: 84.4 fL (ref 80.0–100.0)
Platelets: 150 10*3/uL (ref 150–400)
RBC: 3.71 MIL/uL — ABNORMAL LOW (ref 3.87–5.11)
RDW: 13.7 % (ref 11.5–15.5)
WBC: 7.8 10*3/uL (ref 4.0–10.5)
nRBC: 0 % (ref 0.0–0.2)

## 2021-10-18 LAB — RESP PANEL BY RT-PCR (FLU A&B, COVID) ARPGX2
Influenza A by PCR: NEGATIVE
Influenza B by PCR: NEGATIVE
SARS Coronavirus 2 by RT PCR: NEGATIVE

## 2021-10-18 LAB — COMPREHENSIVE METABOLIC PANEL
ALT: 13 U/L (ref 0–44)
AST: 17 U/L (ref 15–41)
Albumin: 3.8 g/dL (ref 3.5–5.0)
Alkaline Phosphatase: 40 U/L (ref 38–126)
Anion gap: 8 (ref 5–15)
BUN: 31 mg/dL — ABNORMAL HIGH (ref 8–23)
CO2: 24 mmol/L (ref 22–32)
Calcium: 9.5 mg/dL (ref 8.9–10.3)
Chloride: 104 mmol/L (ref 98–111)
Creatinine, Ser: 1.2 mg/dL — ABNORMAL HIGH (ref 0.44–1.00)
GFR, Estimated: 42 mL/min — ABNORMAL LOW (ref >60)
Glucose, Bld: 94 mg/dL (ref 70–99)
Potassium: 4.3 mmol/L (ref 3.5–5.1)
Sodium: 136 mmol/L (ref 135–145)
Total Bilirubin: 0.7 mg/dL (ref 0.3–1.2)
Total Protein: 6.7 g/dL (ref 6.5–8.1)

## 2021-10-18 LAB — TROPONIN I (HIGH SENSITIVITY)
Troponin I (High Sensitivity): 12 ng/L (ref <18)
Troponin I (High Sensitivity): 14 ng/L (ref <18)

## 2021-10-18 LAB — BRAIN NATRIURETIC PEPTIDE: B Natriuretic Peptide: 189.5 pg/mL — ABNORMAL HIGH (ref 0.0–100.0)

## 2021-10-18 NOTE — ED Triage Notes (Signed)
Pt BIB [facility] from urgent care for chest pain, dizziness, and SOB x 1 day; BP154/78, HR 58; 100% RA, RR 30; NIH 0; denies n/v/d/LOC; hx of TIA

## 2021-10-18 NOTE — ED Provider Triage Note (Signed)
Emergency Medicine Provider Triage Evaluation Note  [name] , a 88 y.o. female  was evaluated in triage.  Pt complains of chest pain, shortness of breath, dizziness, generalized weakness for the past 2 to 3 days.  She states that she called EMS at her house multiple times who told her to lay down and rest when her she felt symptoms of dizziness and shortness of breath which she states has helped her symptoms up until today when it did not resolve prompting her visit to the emergency department..  Review of Systems  Positive: See abov Negative:   Physical Exam  BP (!) 162/72 (BP Location: Right Arm)   Pulse (!) 56   Temp 97.8 F (36.6 C) (Oral)   Resp 14   Ht '5\' 3"'$  (1.6 m)   Wt 56.2 kg   SpO2 91%   BMI 21.97 kg/m  Gen:   Awake, no distress   Resp:  Normal effort  MSK:   Moves extremities without difficulty  Other:  Lungs clear to auscultation.  Systolic murmur noted.  No extremity edema noted.  Medical Decision Making  Medically screening exam initiated at 8:04 PM.  Appropriate orders placed.  Delaynee P Begin was informed that the remainder of the evaluation will be completed by another provider, this initial triage assessment does not replace that evaluation, and the importance of remaining in the ED until their evaluation is complete.     Wilnette Kales, [place] 10/18/21 2006

## 2021-10-18 NOTE — ED Notes (Signed)
Patient is being discharged from the Urgent Care and sent to the Emergency Department via [facility] . Per Legrand Como FNP, patient is in need of higher level of care due to need of further evaluation. Patient is aware and verbalizes understanding of plan of care.  Vitals:   10/18/21 1821  BP: (!) 154/65  Pulse: (!) 57  Resp: 18  Temp: 97.6 F (36.4 C)  SpO2: 100%

## 2021-10-18 NOTE — ED Provider Notes (Signed)
[name]    CSN: [id number] Arrival date & time: 10/18/21  1805      History   Chief Complaint Chief Complaint  Patient presents with   Chest Pain   Shortness of Breath    HPI [name] is a 88 y.o. female.   HPI Very pleasant 88 year old female presents with chest pain, shortness of breath and dizziness yesterday afternoon.  Patient is accompanied by her son this evening.  PMH significant for DVT, CKD stage III and TIA.  EKG during triage reveals incomplete right bundle branch block.  Past Medical History:  Diagnosis Date   Abnormal Pap smear 1975-76   Acute deep vein thrombosis (DVT) of left peroneal vein (HCC)    Anemia    history of   Arthritis    spine   Breast cyst, left 1980   Cystocele 2012   Diverticulosis    with h/o Diverticulitis   DVT, lower extremity, distal, chronic ([place]) 12/24/2018   GERD (gastroesophageal reflux disease)    H/O hypercholesterolemia    H/O osteopenia    H/O varicella    H/O varicose veins    Hx of Mumps    Macular degeneration    Seasonal allergies    TIA (transient ischemic attack)    Urge incontinence 2012   Urinary frequency 2010    Patient Active Problem List   Diagnosis Date Noted   History of DVT of lower extremity 04/04/2021   Chronic kidney disease, stage 3a ([place]) 04/04/2021   AKI (acute kidney injury) ([place]) 04/04/2021   Unintentional weight loss 04/04/2021   Dyslipidemia, goal LDL below 100 04/12/2020   Palpitations 05/30/2018   Irregular heartbeat 03/02/2018   TIA (transient ischemic attack) 02/15/2018   Urinary, incontinence, stress female 06/16/2016   Right carotid bruit 12/23/2014   Exertional dyspnea 12/24/2013   Bilateral arm numbness and tingling while sleeping - and shortly after waking 12/24/2013   Poor balance 12/24/2013   Edema of left lower extremity 12/21/2012   Essential hypertension    H/O hypercholesterolemia    Osteoporosis - of the spine 09/22/2011    Past Surgical  History:  Procedure Laterality Date   ABDOMINAL HYSTERECTOMY     BLADDER SUSPENSION N/A 06/16/2016   Procedure: TRANSVAGINAL TAPE (TVT) PROCEDURE;  Surgeon: Everett Graff, MD;  Location: [facility];  Service: Gynecology;  Laterality: N/A;   BREAST CYST ASPIRATION  1964   COLONOSCOPY     Dr. Jodene Nam REPAIR N/A 06/16/2016   Procedure: ANTERIOR REPAIR (CYSTOCELE);  Surgeon: Everett Graff, MD;  Location: [facility];  Service: Gynecology;  Laterality: N/A;   CYSTOSCOPY N/A 06/16/2016   Procedure: CYSTOSCOPY;  Surgeon: Everett Graff, MD;  Location: [facility];  Service: Gynecology;  Laterality: N/A;  see anterior repair   Lower Extremity Venous Dopplers  01/09/2012   Right and left lower steroids: No evidence of thrombus or, thrombophlebitis; right and left GSV and SSV: No venous insufficiency. Normal exam.   NM MYOVIEW LTD  11/2016   6.6 METS.  Reached 106% of max.  Heart rate.  Walk for 4:40 min.  EF 72%.  Normal blood pressure response.  upsloping ST segment depression, nonspecific.  Otherwise normal study.  LOW RISK.  No evidence of ischemia or infarction.   TRANSTHORACIC ECHOCARDIOGRAM  01/2018   EF > 65%. LV size small. Gr 1 DD (normal for age).   Normal atrial size. Moderate Aortic Sclerosis without Stenosis. Moderate Mitral Annular Calcification with mild MR & no  MS    OB History     Gravida  3   Para  3   Term  3   Preterm      AB      Living  3      SAB      IAB      Ectopic      Multiple      Live Births  3            Home Medications    Prior to Admission medications   Medication Sig Start Date End Date Taking? Authorizing Provider  acetaminophen (TYLENOL) 500 MG tablet Take 500-1,000 mg by mouth every 8 (eight) hours as needed for mild pain.     [provider]  albuterol (VENTOLIN HFA) 108 (90 Base) MCG/ACT inhaler Inhale 2 puffs into the lungs every 4 (four) hours as needed for wheezing or shortness of breath.  01/30/19   [provider]  aspirin EC 81 MG EC tablet Take 1 tablet (81 mg total) by mouth daily. Swallow whole. 04/06/21   Pokhrel, Corrie Mckusick, MD  Cholecalciferol (VITAMIN D-3) 25 MCG (1000 UT) CAPS Take 1,000 Units by mouth daily with breakfast.     [provider]  ezetimibe (ZETIA) 10 MG tablet Take 10 mg by mouth daily. 02/05/20   [provider]  Lactobacillus Rhamnosus, GG, (CULTURELLE) CAPS Take 1 capsule by mouth every morning.     [provider]  Magnesium 250 MG TABS Take 250 mg by mouth daily as needed (leg cramps).    [provider]  metoprolol succinate (TOPROL-XL) 25 MG 24 hr tablet Take 25 mg by mouth daily. 06/04/21   [provider]  Multiple Vitamins-Minerals (AIRBORNE PO) Take 1 tablet by mouth daily with breakfast.    [provider]  Multiple Vitamins-Minerals (CENTRUM SILVER ULTRA WOMENS) TABS Take 1 tablet by mouth daily with breakfast.     [provider]  pravastatin (PRAVACHOL) 20 MG tablet Take 2 tablets (40 mg total) by mouth daily. Patient taking differently: Take 40 mg by mouth daily. Pt states she takes medication as needed 04/05/21   Pokhrel, Corrie Mckusick, MD  Saccharomyces boulardii (PROBIOTIC) 250 MG CAPS Take by mouth daily. Pt isn't sure of the dosage    [provider]  Trospium Chloride 60 MG CP24 Take 1 capsule by mouth daily. 03/18/21   [provider]  vitamin B-12 (CYANOCOBALAMIN) 100 MCG tablet Take 100 mcg by mouth daily.    [provider]    Family History Family History  Problem Relation Age of Onset   Uterine cancer Mother 34   Heart attack Brother 20   Breast cancer Daughter    Stroke Neg Hx     Social History Social History   Tobacco Use   Smoking status: Never   Smokeless tobacco: Never  Vaping Use   Vaping Use: Never used  Substance Use Topics   Alcohol use: No   Drug use: No     Allergies   Atorvastatin, Desmopressin acetate, Iodinated contrast media,  Myrbetriq [mirabegron], and Iodine   Review of Systems Review of Systems  Respiratory:  Positive for shortness of breath.   Cardiovascular:  Positive for chest pain and palpitations.  All other systems reviewed and are negative.    Physical Exam Triage Vital Signs ED Triage Vitals [10/18/21 1821]  Enc Vitals Group     BP (!) 154/65     Pulse Rate (!) 57  Resp 18     Temp 97.6 F (36.4 C)     Temp Source Oral     SpO2 100 %     Weight      Height      Head Circumference      Peak Flow      Pain Score 6     Pain Loc      Pain Edu?      Excl. in [place]?    No data found.  Updated Vital Signs BP (!) 154/65 (BP Location: Right Arm)   Pulse (!) 57   Temp 97.6 F (36.4 C) (Oral)   Resp 18   SpO2 100%    Physical Exam Vitals and nursing note reviewed.  Constitutional:      General: She is not in acute distress.    Appearance: Normal appearance. She is normal weight. She is ill-appearing. She is not toxic-appearing or diaphoretic.  HENT:     Head: Normocephalic and atraumatic.     Mouth/Throat:     Mouth: Mucous membranes are moist.     Pharynx: Oropharynx is clear.  Eyes:     Extraocular Movements: Extraocular movements intact.     Conjunctiva/sclera: Conjunctivae normal.     Pupils: Pupils are equal, round, and reactive to light.  Cardiovascular:     Rate and Rhythm: Normal rate and regular rhythm.     Pulses: Normal pulses.     Heart sounds: Normal heart sounds. No murmur heard. Pulmonary:     Effort: Pulmonary effort is normal.     Breath sounds: Normal breath sounds. No wheezing, rhonchi or rales.  Musculoskeletal:        General: Normal range of motion.     Cervical back: Normal range of motion and neck supple.  Skin:    General: Skin is warm and dry.  Neurological:     General: No focal deficit present.     Mental Status: She is alert and oriented to person, place, and time. Mental status is at baseline.     Cranial Nerves: No cranial nerve deficit.      Motor: Weakness present.     Gait: Gait abnormal.      UC Treatments / Results  Labs (all labs ordered are listed, but only abnormal results are displayed) Labs Reviewed - No data to display  EKG   Radiology No results found.  Procedures Procedures (including critical care time)  Medications Ordered in UC Medications - No data to display  Initial Impression / Assessment and Plan / UC Course  I have reviewed the triage vital signs and the nursing notes.  Pertinent labs & imaging results that were available during my care of the patient were reviewed by me and considered in my medical decision making (see chart for details).     MDM: 1. Chest pain, unspecified type-EKG reveals sinus bradycardia with sinus arrhythmia, incomplete right bundle branch block-EKG of 06/01/2021 reveals some more rhythm.  Patient transported to [facility] ED NOW via [facility] this evening. Final Clinical Impressions(s) / UC Diagnoses   Final diagnoses:  Chest pain, unspecified type  Dizziness     Discharge Instructions      Advised patient/son we would be transporting her to [facility] ED via [facility] this evening.     ED Prescriptions   None    PDMP not reviewed this encounter.   Eliezer Lofts, [place] 10/18/21 [phone]

## 2021-10-18 NOTE — ED Triage Notes (Signed)
Pt c/o chest pain, SOB, dizziness, and near syncope intermittent since yesterday. Taking tylenol.

## 2021-10-18 NOTE — ED Notes (Signed)
Attempted to call report to ED CN x2 with no success.

## 2021-10-18 NOTE — Discharge Instructions (Addendum)
Advised patient/son we would be transporting her to [facility] ED via [facility] this evening.

## 2021-10-18 NOTE — ED Provider Notes (Signed)
[facility] Emergency Department Provider Note MRN:  [id number]  Arrival date & time: 10/19/21     Chief Complaint   Chest Pain   History of Present Illness   [name] is a 88 y.o. year-old female presents to the ED with chief complaint of chest pain, SOB, and dizziness. She reports worsening symptoms over the past 2-3 days.  She states that if she exerts herself, she gets very short of breath.  She states that anytime she stands up, she gets very dizzy and feels off balance.  She reports associated headache.  She denies numbness, weakness, or tingling.  Denies vision or speech changes.  She takes aspirin.  Cardiologist: Dr. Ellyn Hack.  History provided by patient.   Review of Systems  Pertinent positive and negative review of systems noted in HPI.    Physical Exam   Vitals:   10/19/21 0230 10/19/21 0357  BP: (!) 167/63   Pulse: (!) 53   Resp: 11   Temp:  98 F (36.7 C)  SpO2: 100%     CONSTITUTIONAL:  non toxic-appearing, NAD NEURO:  Alert and oriented x 3, CN 3-12 grossly intact EYES:  eyes equal and reactive ENT/NECK:  Supple, no stridor  CARDIO:  normal rate, regular rhythm, appears well-perfused  PULM:  No respiratory distress, CTAB GI/GU:  non-distended,  MSK/SPINE:  No gross deformities, no edema, moves all extremities  SKIN:  no rash, atraumatic   *Additional and/or pertinent findings included in MDM below  Diagnostic and Interventional Summary    EKG Interpretation  Date/Time:  Monday October 18 2021 22:22:57 EDT Ventricular Rate:  62 PR Interval:  182 QRS Duration: 119 QT Interval:  441 QTC Calculation: 448 R Axis:   46 Text Interpretation: Sinus rhythm Left atrial enlargement Incomplete right bundle branch block When compared with ECG of EARLIER SAME DATE No significant change was found Confirmed by Delora Fuel ([id number]) on 10/19/2021 3:48:38 AM       Labs Reviewed  CBC - Abnormal; Notable for the following  components:      Result Value   RBC 3.71 (*)    Hemoglobin 11.7 (*)    HCT 31.3 (*)    MCHC 37.4 (*)    All other components within normal limits  COMPREHENSIVE METABOLIC PANEL - Abnormal; Notable for the following components:   BUN 31 (*)    Creatinine, Ser 1.20 (*)    GFR, Estimated 42 (*)    All other components within normal limits  BRAIN NATRIURETIC PEPTIDE - Abnormal; Notable for the following components:   B Natriuretic Peptide 189.5 (*)    All other components within normal limits  D-DIMER, QUANTITATIVE - Abnormal; Notable for the following components:   D-Dimer, Quant 0.58 (*)    All other components within normal limits  RESP PANEL BY RT-PCR (FLU A&B, COVID) ARPGX2  TSH  TROPONIN I (HIGH SENSITIVITY)  TROPONIN I (HIGH SENSITIVITY)    MR BRAIN WO CONTRAST  Final Result    DG Chest 2 View  Final Result      Medications  ezetimibe (ZETIA) tablet 10 mg (has no administration in time range)  metoprolol succinate (TOPROL-XL) 24 hr tablet 25 mg (has no administration in time range)  pravastatin (PRAVACHOL) tablet 40 mg (has no administration in time range)  acidophilus (RISAQUAD) capsule 1 capsule (has no administration in time range)  Trospium Chloride CP24 60 mg (has no administration in time range)  vitamin B-12 (CYANOCOBALAMIN) tablet 100 mcg (has  no administration in time range)  cholecalciferol (VITAMIN D3) 25 MCG (1000 UNIT) tablet 1,000 Units (has no administration in time range)  magnesium oxide (MAG-OX) tablet 200 mg (has no administration in time range)  multivitamin with minerals tablet 1 tablet (has no administration in time range)  albuterol (PROVENTIL) (2.5 MG/3ML) 0.083% nebulizer solution 2.5 mg (has no administration in time range)  acetaminophen (TYLENOL) tablet 650 mg (has no administration in time range)  ondansetron (ZOFRAN) injection 4 mg (has no administration in time range)  enoxaparin (LOVENOX) injection 30 mg (has no administration in time  range)  aspirin EC tablet 81 mg (has no administration in time range)  ALPRAZolam (XANAX) tablet 0.25 mg (has no administration in time range)  traZODone (DESYREL) tablet 25 mg (has no administration in time range)  magnesium hydroxide (MILK OF MAGNESIA) suspension 30 mL (has no administration in time range)  meclizine (ANTIVERT) tablet 12.5 mg (has no administration in time range)  furosemide (LASIX) injection 20 mg (20 mg Intravenous Given 10/19/21 0303)  guaiFENesin (MUCINEX) 12 hr tablet 600 mg (600 mg Oral Given 10/19/21 0302)     Procedures  /  Critical Care Procedures  ED Course and Medical Decision Making  I have reviewed the triage vital signs, the nursing notes, and pertinent available records from the EMR.  Social Determinants Affecting Complexity of Care: Patient has no clinically significant social determinants affecting this chief complaint..   ED Course:    Medical Decision Making Patient here with CP for the past 2-3 days.  Has had associated dyspnea on exertion as well as dizziness.  Hx of prior TIA and DVT.  On aspirin, but not otherwise anticoagulated.  Last echo in March.  Sees Dr. Ellyn Hack.    Despite initially reassuring workup, given age, risk factors, and persistent symptoms, will consult hospitalist for observation admission.   Amount and/or Complexity of Data Reviewed Labs: ordered.    Details: Trops are flat at 12->14.  BNP is 189.  D-dimer is 0.58, age adjusted, I have lower suspicion for PE. Radiology: ordered and independent interpretation performed.    Details: MRI shows no new stroke ECG/medicine tests: independent interpretation performed.    Details: Incomplete RBBB  Risk Decision regarding hospitalization.     Consultants: I discussed the case with Hospitalist, Dr. Sidney Ace, who is appreciated for admitting.   Treatment and Plan: Patient's exam and diagnostic results are concerning for chest pain.  Feel that patient will need admission to  the hospital for further treatment and evaluation.    Final Clinical Impressions(s) / ED Diagnoses     ICD-10-CM   1. Chest pain, unspecified type  R07.9     2. Dyspnea on exertion  R06.09     3. Dizziness and giddiness  R42       ED Discharge Orders     None         Discharge Instructions Discussed with and Provided to Patient:   Discharge Instructions   None      Montine Circle, PA-C 10/19/21 0443    Orpah Greek, MD 10/19/21 [phone]

## 2021-10-18 NOTE — ED Notes (Signed)
[facility] notified of transport.

## 2021-10-19 ENCOUNTER — Encounter (HOSPITAL_COMMUNITY): Payer: Self-pay | Admitting: Family Medicine

## 2021-10-19 DIAGNOSIS — R42 Dizziness and giddiness: Secondary | ICD-10-CM

## 2021-10-19 DIAGNOSIS — R072 Precordial pain: Secondary | ICD-10-CM | POA: Diagnosis not present

## 2021-10-19 DIAGNOSIS — R079 Chest pain, unspecified: Secondary | ICD-10-CM | POA: Diagnosis not present

## 2021-10-19 DIAGNOSIS — E785 Hyperlipidemia, unspecified: Secondary | ICD-10-CM | POA: Diagnosis not present

## 2021-10-19 DIAGNOSIS — Z8673 Personal history of transient ischemic attack (TIA), and cerebral infarction without residual deficits: Secondary | ICD-10-CM | POA: Diagnosis not present

## 2021-10-19 DIAGNOSIS — I10 Essential (primary) hypertension: Secondary | ICD-10-CM

## 2021-10-19 DIAGNOSIS — R634 Abnormal weight loss: Secondary | ICD-10-CM | POA: Diagnosis present

## 2021-10-19 LAB — D-DIMER, QUANTITATIVE: D-Dimer, Quant: 0.58 ug/mL-FEU — ABNORMAL HIGH (ref 0.00–0.50)

## 2021-10-19 LAB — TSH: TSH: 0.628 u[IU]/mL (ref 0.350–4.500)

## 2021-10-19 MED ORDER — METOPROLOL SUCCINATE ER 25 MG PO TB24
25.0000 mg | ORAL_TABLET | Freq: Every day | ORAL | Status: DC
  Administered 2021-10-19 – 2021-10-22 (×4): 25 mg via ORAL
  Filled 2021-10-19 (×4): qty 1

## 2021-10-19 MED ORDER — MAGNESIUM HYDROXIDE 400 MG/5ML PO SUSP: 30.0000 mL | Freq: Every day | ORAL | Status: DC | PRN

## 2021-10-19 MED ORDER — RISAQUAD PO CAPS
1.0000 | ORAL_CAPSULE | Freq: Every morning | ORAL | Status: DC
  Administered 2021-10-19 – 2021-10-22 (×4): 1 via ORAL
  Filled 2021-10-19 (×5): qty 1

## 2021-10-19 MED ORDER — FESOTERODINE FUMARATE ER 4 MG PO TB24
4.0000 mg | ORAL_TABLET | Freq: Every day | ORAL | Status: DC
  Administered 2021-10-19 – 2021-10-22 (×4): 4 mg via ORAL
  Filled 2021-10-19 (×4): qty 1

## 2021-10-19 MED ORDER — ADULT MULTIVITAMIN W/MINERALS CH
1.0000 | ORAL_TABLET | Freq: Every day | ORAL | Status: DC
  Administered 2021-10-19 – 2021-10-22 (×4): 1 via ORAL
  Filled 2021-10-19 (×4): qty 1

## 2021-10-19 MED ORDER — EZETIMIBE 10 MG PO TABS
10.0000 mg | ORAL_TABLET | Freq: Every day | ORAL | Status: DC
  Administered 2021-10-19 – 2021-10-22 (×4): 10 mg via ORAL
  Filled 2021-10-19 (×4): qty 1

## 2021-10-19 MED ORDER — PREDNISONE 50 MG PO TABS
50.0000 mg | ORAL_TABLET | Freq: Four times a day (QID) | ORAL | Status: AC
  Administered 2021-10-19 (×3): 50 mg via ORAL
  Filled 2021-10-19 (×2): qty 3
  Filled 2021-10-19: qty 1

## 2021-10-19 MED ORDER — VITAMIN B-12 100 MCG PO TABS
100.0000 ug | ORAL_TABLET | Freq: Every day | ORAL | Status: DC
  Administered 2021-10-19 – 2021-10-22 (×4): 100 ug via ORAL
  Filled 2021-10-19 (×4): qty 1

## 2021-10-19 MED ORDER — ACETAMINOPHEN 325 MG PO TABS
650.0000 mg | ORAL_TABLET | ORAL | Status: DC | PRN
  Administered 2021-10-20 – 2021-10-21 (×3): 650 mg via ORAL
  Filled 2021-10-19 (×3): qty 2

## 2021-10-19 MED ORDER — ALBUTEROL SULFATE (2.5 MG/3ML) 0.083% IN NEBU: 2.5000 mg | INHALATION_SOLUTION | RESPIRATORY_TRACT | Status: DC | PRN

## 2021-10-19 MED ORDER — VITAMIN D 25 MCG (1000 UNIT) PO TABS
1000.0000 [IU] | ORAL_TABLET | Freq: Every day | ORAL | Status: DC
  Administered 2021-10-19 – 2021-10-22 (×4): 1000 [IU] via ORAL
  Filled 2021-10-19 (×4): qty 1

## 2021-10-19 MED ORDER — TRAZODONE HCL 50 MG PO TABS: 25.0000 mg | ORAL_TABLET | Freq: Every evening | ORAL | Status: DC | PRN

## 2021-10-19 MED ORDER — ASPIRIN 81 MG PO TBEC
81.0000 mg | DELAYED_RELEASE_TABLET | Freq: Every day | ORAL | Status: DC
  Administered 2021-10-19 – 2021-10-22 (×4): 81 mg via ORAL
  Filled 2021-10-19 (×4): qty 1

## 2021-10-19 MED ORDER — ENOXAPARIN SODIUM 30 MG/0.3ML IJ SOSY
30.0000 mg | PREFILLED_SYRINGE | INTRAMUSCULAR | Status: DC
  Administered 2021-10-19 – 2021-10-20 (×2): 30 mg via SUBCUTANEOUS
  Filled 2021-10-19 (×2): qty 0.3

## 2021-10-19 MED ORDER — TROSPIUM CHLORIDE ER 60 MG PO CP24
1.0000 | ORAL_CAPSULE | Freq: Every day | ORAL | Status: DC
Start: 2021-10-19 — End: 2021-10-19

## 2021-10-19 MED ORDER — DIPHENHYDRAMINE HCL 25 MG PO CAPS
50.0000 mg | ORAL_CAPSULE | Freq: Once | ORAL | Status: AC
  Administered 2021-10-19: 50 mg via ORAL
  Filled 2021-10-19: qty 2

## 2021-10-19 MED ORDER — MAGNESIUM OXIDE -MG SUPPLEMENT 400 (240 MG) MG PO TABS: 200.0000 mg | ORAL_TABLET | Freq: Every day | ORAL | Status: DC | PRN

## 2021-10-19 MED ORDER — GUAIFENESIN ER 600 MG PO TB12
600.0000 mg | ORAL_TABLET | Freq: Two times a day (BID) | ORAL | Status: DC
  Administered 2021-10-19 – 2021-10-22 (×8): 600 mg via ORAL
  Filled 2021-10-19 (×8): qty 1

## 2021-10-19 MED ORDER — ONDANSETRON HCL 4 MG/2ML IJ SOLN: 4.0000 mg | Freq: Four times a day (QID) | INTRAMUSCULAR | Status: DC | PRN

## 2021-10-19 MED ORDER — MECLIZINE HCL 25 MG PO TABS: 12.5000 mg | ORAL_TABLET | Freq: Three times a day (TID) | ORAL | Status: DC | PRN

## 2021-10-19 MED ORDER — DIPHENHYDRAMINE HCL 50 MG/ML IJ SOLN
50.0000 mg | Freq: Once | INTRAMUSCULAR | Status: AC
  Filled 2021-10-19: qty 1

## 2021-10-19 MED ORDER — ASPIRIN 81 MG PO TBEC: 81.0000 mg | DELAYED_RELEASE_TABLET | Freq: Every day | ORAL | Status: DC

## 2021-10-19 MED ORDER — ALPRAZOLAM 0.25 MG PO TABS
0.2500 mg | ORAL_TABLET | Freq: Two times a day (BID) | ORAL | Status: DC | PRN
  Administered 2021-10-20: 0.25 mg via ORAL
  Filled 2021-10-19 (×2): qty 1

## 2021-10-19 MED ORDER — PREDNISONE 50 MG PO TABS
50.0000 mg | ORAL_TABLET | Freq: Once | ORAL | Status: AC
  Administered 2021-10-20: 50 mg via ORAL
  Filled 2021-10-19: qty 1

## 2021-10-19 MED ORDER — DIPHENHYDRAMINE HCL 25 MG PO CAPS
50.0000 mg | ORAL_CAPSULE | Freq: Once | ORAL | Status: AC
  Administered 2021-10-20: 50 mg via ORAL
  Filled 2021-10-19: qty 2

## 2021-10-19 MED ORDER — FUROSEMIDE 10 MG/ML IJ SOLN
20.0000 mg | Freq: Two times a day (BID) | INTRAMUSCULAR | Status: DC
  Administered 2021-10-19 – 2021-10-20 (×3): 20 mg via INTRAVENOUS
  Filled 2021-10-19 (×3): qty 2

## 2021-10-19 MED ORDER — SODIUM CHLORIDE 0.9 % IV SOLN: INTRAVENOUS | Status: DC

## 2021-10-19 MED ORDER — PRAVASTATIN SODIUM 40 MG PO TABS
40.0000 mg | ORAL_TABLET | Freq: Every day | ORAL | Status: DC
  Administered 2021-10-19 – 2021-10-21 (×3): 40 mg via ORAL
  Filled 2021-10-19 (×3): qty 1

## 2021-10-19 NOTE — ED Notes (Signed)
Ambulated pt to bathroom and back to bed. Pt resting comfortably.

## 2021-10-19 NOTE — Assessment & Plan Note (Signed)
-   We will continue her aspirin

## 2021-10-19 NOTE — Assessment & Plan Note (Signed)
-   We will continue statin therapy as well as Zetia. 

## 2021-10-19 NOTE — Progress Notes (Signed)
88 year old.fem Prior DVT not on anticoagulation Previous TIA 2020-last 03/2021 (first 1 in 2020 presyncope versus anxiety/TIA) CKD 3 AA secondary to HTN Had a DVT 12/2018-managed conservatively as an outpatient  Presented from urgent care with chest pain dizziness and shortness of breath Worsening symptoms over the past several days in addition to feeling off balance and dizzy on standing and movement BNP was elevated 189, troponins flat 12-14 D-dimer 0.58 MRI brain showed no acute stroke, microvascular injury in the past  She has been followed chronically by neurology for TIAs and had left leg weakness at last visit in June 2023 lumbar MRI showed degeneration at L3-L4 with no significant real change from 2016 and degeneration from at T12 (could cause pain)  She tells me she came into the hospital because every time she bends over she feels "dizzy" and has been short of breath for about 2 weeks She has no overt chest pain--I think she conflates chest pain with shortness of breath  O/e BP (!) 144/60   Pulse 61   Temp 98.2 F (36.8 C) (Oral)   Resp 15   Ht '5\' 3"'$  (1.6 m)   Wt 56.2 kg   SpO2 98%   BMI 21.97 kg/m  She is borderline positive for BPPV on my exam with looking at the endrange of eye movement Chest is clear no rales rhonchi wheeze S1-S2 no murmur She is not swollen  P I do not think she has had a PE but she had a DVT in 2020 so we will get a CT of the chest Cardiology has seen the patient started on cautious Lasix She does have hypofunction of the left side of her vestibular apparatus so she will need outpatient therapy If she looks this good tomorrow and CT of the chest is negative she can probably discharge home  Verneita Griffes, MD [facility] 2:36 PM

## 2021-10-19 NOTE — Evaluation (Signed)
Physical Therapy Evaluation Patient Details Name: [name] MRN: [id number] DOB: 09-13-1927 Today's Date: 10/19/2021  History of Present Illness  [name] is a 88 y.o. female admitted 10/2  who presented to the ER with acute onset of chest pain with associated dyspnea and dizziness which have been worsening over the last 2 to 3 days.  She admits to dizziness when she stands up and feels off balance.  She has been having headaches without paresthesias or focal muscle weakness.  Left hemibody with numbness and weakness at times but MRI with no acute changes.  PMH:  dyslipidemia and GERD, TIA, DVT, diverticulosis and osteoarthritis  Clinical Impression  Pt admitted with above diagnosis. Pt positive for left hypofunction of vestibular system. Initiated x 1 exercises and pt can perform with cues.  Pt is unsteady on her feet and encouraged pt to use RW at home initially.  Pt agrees.  She will benefit from HHPT for vestibular rehab with transition to Outpt PT.  She states her husband uses cane and her daugher is coming to stay with them a few days.  Will follow acutely.  Pt currently with functional limitations due to the deficits listed below (see PT Problem List). Pt will benefit from skilled PT to increase their independence and safety with mobility to allow discharge to the venue listed below.          Recommendations for follow up therapy are one component of a multi-disciplinary discharge planning process, led by the attending physician.  Recommendations may be updated based on patient status, additional functional criteria and insurance authorization.  Follow Up Recommendations Home health PT (vestibular rehab with transition to [place])      Assistance Recommended at Discharge Intermittent Supervision/Assistance  Patient can return home with the following  A little help with walking and/or transfers;A little help with bathing/dressing/bathroom;Assistance with cooking/housework;Assist for  transportation;Help with stairs or ramp for entrance    Equipment Recommendations Other (comment) (Pt stated she has a RW - will need to use one)  Recommendations for Other Services       Functional Status Assessment Patient has had a recent decline in their functional status and demonstrates the ability to make significant improvements in function in a reasonable and predictable amount of time.     Precautions / Restrictions Precautions Precautions: Fall Restrictions Weight Bearing Restrictions: No      Mobility  Bed Mobility Overal bed mobility: Independent                  Transfers Overall transfer level: Needs assistance Equipment used: 2 person hand held assist Transfers: Sit to/from Stand Sit to Stand: Min assist           General transfer comment: Needed steadying assist upon standing.    Ambulation/Gait Ambulation/Gait assistance: Min assist Gait Distance (Feet): 20 Feet Assistive device: 2 person hand held assist, 1 person hand held assist Gait Pattern/deviations: Step-through pattern, Decreased stride length, Wide base of support, Staggering left, Staggering right, Leaning posteriorly   Gait velocity interpretation: <1.31 ft/sec, indicative of household ambulator   General Gait Details: Pt needed at least 1 UE support and did best with bil UE support as she is unsteady on her feet.  Pt reports dizziness and feeling "wobbly".  Stairs            Wheelchair Mobility    Modified Rankin (Stroke Patients Only)       Balance Overall balance assessment: Needs assistance Sitting-balance support:  No upper extremity supported, Feet supported Sitting balance-Leahy Scale: Fair     Standing balance support: Bilateral upper extremity supported, Single extremity supported, During functional activity Standing balance-Leahy Scale: Poor Standing balance comment: relies on at least 1 UE support for balance.Needed external support as well.                              Pertinent Vitals/Pain Pain Assessment Pain Assessment: No/denies pain    Home Living Family/patient expects to be discharged to:: Private residence Living Arrangements: Spouse/significant other Available Help at Discharge: Family;Available 24 hours/day Type of Home: House Home Access: Stairs to enter Entrance Stairs-Rails: Right Entrance Stairs-Number of Steps: 2   Home Layout: One level Home Equipment: Rolling Walker (2 wheels);Grab bars - tub/shower;Cane - single point Additional Comments: Off balance for a week or so per pt    Prior Function Prior Level of Function : Independent/Modified Independent;Driving             Mobility Comments: I without device ADLs Comments: indep ADLs/IADLs, driving     Hand Dominance   Dominant Hand: Right    Extremity/Trunk Assessment   Upper Extremity Assessment Upper Extremity Assessment: Defer to OT evaluation    Lower Extremity Assessment Lower Extremity Assessment: LLE deficits/detail LLE Sensation: decreased light touch    Cervical / Trunk Assessment Cervical / Trunk Assessment: Normal  Communication   Communication: No difficulties  Cognition Arousal/Alertness: Awake/alert Behavior During Therapy: WFL for tasks assessed/performed Overall Cognitive Status: Within Functional Limits for tasks assessed                                          General Comments General comments (skin integrity, edema, etc.): 70 bpm, 100% RA, 152/73 supine; 75 bpm, 145/86 sitting; 152/88, 75 bpm, standing;  standing after 3 min 154/80 75 bpm.    Exercises General Exercises - Lower Extremity Ankle Circles/Pumps: AROM, Both, 10 reps, Supine Long Arc Quad: AROM, Both, 10 reps, Seated Hip Flexion/Marching: AROM, Both, 10 reps, Seated Other Exercises Other Exercises: Gaze stabilization exercises iniiated with handout given to pt as well.  x1 exercises head nods and rotation x 3 reps of 45 seconds.    Assessment/Plan    PT Assessment Patient needs continued PT services  PT Problem List Decreased activity tolerance;Decreased balance;Decreased mobility;Decreased knowledge of use of DME;Decreased coordination;Decreased safety awareness;Decreased knowledge of precautions       PT Treatment Interventions DME instruction;Gait training;Functional mobility training;Therapeutic activities;Therapeutic exercise;Balance training;Stair training;Patient/family education    PT Goals (Current goals can be found in the Care Plan section)  Acute Rehab PT Goals Patient Stated Goal: to go home PT Goal Formulation: With patient Time For Goal Achievement: 11/02/21 Potential to Achieve Goals: Good    Frequency Min 4X/week     Co-evaluation               AM-PAC PT "6 Clicks" Mobility  Outcome Measure Help needed turning from your back to your side while in a flat bed without using bedrails?: None Help needed moving from lying on your back to sitting on the side of a flat bed without using bedrails?: None Help needed moving to and from a bed to a chair (including a wheelchair)?: A Little Help needed standing up from a chair using your arms (e.g., wheelchair or bedside chair)?: A Little  Help needed to walk in hospital room?: A Little Help needed climbing 3-5 steps with a railing? : A Lot 6 Click Score: 19    End of Session Equipment Utilized During Treatment: Gait belt Activity Tolerance: Patient limited by fatigue Patient left: with call bell/phone within reach (on stretcher) Nurse Communication: Mobility status PT Visit Diagnosis: Unsteadiness on feet (R26.81)    Time: 1561-5379 PT Time Calculation (min) (ACUTE ONLY): 48 min   Charges:   PT Evaluation $PT Eval Moderate Complexity: 1 Mod PT Treatments $Gait Training: 8-22 mins $Therapeutic Exercise: 8-22 mins        Kordae Buonocore M,PT Acute Rehab Services [place] 10/19/2021, 12:14 PM

## 2021-10-19 NOTE — Assessment & Plan Note (Addendum)
-   We will check her TSH. - Dietitian consult to be obtained.

## 2021-10-19 NOTE — Assessment & Plan Note (Addendum)
-   She has been having associated left leg and foot numbness with mild unsteady gait. - Brain MRI without contrast came back negative for acute intracranial normalities. - We will place her on as needed Antivert. - She will be on aspirin as mentioned above.

## 2021-10-19 NOTE — ED Notes (Signed)
Patient sittig up om side of the bed eating lunch. No needs expressed

## 2021-10-19 NOTE — Assessment & Plan Note (Signed)
-   We will continue her antihypertensives. 

## 2021-10-19 NOTE — H&P (Addendum)
[place]   PATIENT NAME: [name]    MR#:  [id number]  DATE OF BIRTH:  [birth year]  DATE OF ADMISSION:  10/18/2021  PRIMARY CARE PHYSICIAN: Lavone Orn, MD   Patient is coming from: Home  REQUESTING/REFERRING PHYSICIAN: Montine Circle, PA-C  CHIEF COMPLAINT:   Chief Complaint  Patient presents with   Chest Pain    HISTORY OF PRESENT ILLNESS:  [name] is a 88 y.o. female with medical history significant for dyslipidemia and GERD, TIA, DVT, diverticulosis and osteoarthritis, who presented to the ER with acute onset of chest pain with associated dyspnea and dizziness which have been worsening over the last 2 to 3 days.  She has been having dyspnea on exertion.  She admits to dizziness when she stands up and feels off balance.  She has been having headaches without paresthesias or focal muscle weakness.  No dysphagia or dysarthria.  No nausea or vomiting or abdominal pain.  No fever or chills.  No cough or wheezing or hemoptysis.  No leg pain or edema or recent travels or surgeries.  ED Course: When she came to the ER, vital signs revealed a BP of 154/65 with a heart rate of 57 otherwise normal vital signs.  Labs revealed a creatinine 1.2 with a BUN of 31 with otherwise unremarkable CMP.  BNP was 189.5.  High sensitive troponin was 12 and later 14.  CBC showed hemoglobin 11.7 medical 31.3.  Influenza antigens and COVID-19 PCR came back negative. EKG as reviewed by me : EKG showed sinus rhythm with a rate of 62 left atrial enlargement and incomplete right bundle branch block. Imaging: Two-view chest x-ray showed aortic atherosclerosis with no acute cardiopulmonary disease. Brain MRI without contrast revealed chronic small vessel ischemic disease with no acute Intracranial normalities.  The patient will be admitted to an observation cardiac telemetry bed for further evaluation and management. PAST MEDICAL HISTORY:   Past Medical History:  Diagnosis Date   Abnormal  Pap smear 1975-76   Acute deep vein thrombosis (DVT) of left peroneal vein (HCC)    Anemia    history of   Arthritis    spine   Breast cyst, left 1980   Cystocele 2012   Diverticulosis    with h/o Diverticulitis   DVT, lower extremity, distal, chronic ([place]) 12/24/2018   GERD (gastroesophageal reflux disease)    H/O hypercholesterolemia    H/O osteopenia    H/O varicella    H/O varicose veins    Hx of Mumps    Macular degeneration    Seasonal allergies    TIA (transient ischemic attack)    Urge incontinence 2012   Urinary frequency 2010    PAST SURGICAL HISTORY:   Past Surgical History:  Procedure Laterality Date   ABDOMINAL HYSTERECTOMY     BLADDER SUSPENSION N/A 06/16/2016   Procedure: TRANSVAGINAL TAPE (TVT) PROCEDURE;  Surgeon: Everett Graff, MD;  Location: [facility];  Service: Gynecology;  Laterality: N/A;   BREAST CYST ASPIRATION  1964   COLONOSCOPY     Dr. Jodene Nam REPAIR N/A 06/16/2016   Procedure: ANTERIOR REPAIR (CYSTOCELE);  Surgeon: Everett Graff, MD;  Location: [facility];  Service: Gynecology;  Laterality: N/A;   CYSTOSCOPY N/A 06/16/2016   Procedure: CYSTOSCOPY;  Surgeon: Everett Graff, MD;  Location: [facility];  Service: Gynecology;  Laterality: N/A;  see anterior repair   Lower Extremity Venous Dopplers  01/09/2012   Right and left lower steroids: No evidence  of thrombus or, thrombophlebitis; right and left GSV and SSV: No venous insufficiency. Normal exam.   NM MYOVIEW LTD  11/2016   6.6 METS.  Reached 106% of max.  Heart rate.  Walk for 4:40 min.  EF 72%.  Normal blood pressure response.  upsloping ST segment depression, nonspecific.  Otherwise normal study.  LOW RISK.  No evidence of ischemia or infarction.   TRANSTHORACIC ECHOCARDIOGRAM  01/2018   EF > 65%. LV size small. Gr 1 DD (normal for age).   Normal atrial size. Moderate Aortic Sclerosis without Stenosis. Moderate Mitral Annular Calcification with mild MR & no MS    SOCIAL HISTORY:    Social History   Tobacco Use   Smoking status: Never   Smokeless tobacco: Never  Substance Use Topics   Alcohol use: No    FAMILY HISTORY:   Family History  Problem Relation Age of Onset   Uterine cancer Mother 8   Heart attack Brother 71   Breast cancer Daughter    Stroke Neg Hx     DRUG ALLERGIES:   Allergies  Allergen Reactions   Atorvastatin Other (See Comments)    Unknown reaction   Desmopressin Acetate Other (See Comments)   Iodinated Contrast Media Hives and Other (See Comments)   Myrbetriq [Mirabegron] Other (See Comments)    "Makes me urinate more frequently"   Iodine Rash    REVIEW OF SYSTEMS:   ROS As per history of present illness. All pertinent systems were reviewed above. Constitutional, HEENT, cardiovascular, respiratory, GI, GU, musculoskeletal, neuro, psychiatric, endocrine, integumentary and hematologic systems were reviewed and are otherwise negative/unremarkable except for positive findings mentioned above in the HPI.   MEDICATIONS AT HOME:   Prior to Admission medications   Medication Sig Start Date End Date Taking? Authorizing Provider  acetaminophen (TYLENOL) 500 MG tablet Take 500-1,000 mg by mouth every 8 (eight) hours as needed for mild pain.    Yes [provider]  albuterol (VENTOLIN HFA) 108 (90 Base) MCG/ACT inhaler Inhale 2 puffs into the lungs every 4 (four) hours as needed for wheezing or shortness of breath.  01/30/19  Yes [provider]  aspirin EC 81 MG EC tablet Take 1 tablet (81 mg total) by mouth daily. Swallow whole. 04/06/21  Yes Pokhrel, Laxman, MD  Cholecalciferol (VITAMIN D-3) 25 MCG (1000 UT) CAPS Take 1,000 Units by mouth daily with breakfast.    Yes [provider]  ezetimibe (ZETIA) 10 MG tablet Take 10 mg by mouth daily. 02/05/20  Yes [provider]  Lactobacillus Rhamnosus, GG, (CULTURELLE) CAPS Take 1 capsule by mouth every morning.    Yes [provider]  Magnesium 250  MG TABS Take 250 mg by mouth daily as needed (leg cramps).   Yes [provider]  metoprolol succinate (TOPROL-XL) 25 MG 24 hr tablet Take 25 mg by mouth daily. [place] sprinkle 06/04/21  Yes [provider]  Multiple Vitamins-Minerals (AIRBORNE PO) Take 1 tablet by mouth daily with breakfast.   Yes [provider]  Multiple Vitamins-Minerals (CENTRUM SILVER ULTRA WOMENS) TABS Take 1 tablet by mouth daily with breakfast.    Yes [provider]  Multiple Vitamins-Minerals (ICAPS AREDS 2 PO) Take 1 capsule by mouth daily at 12 noon.   Yes [provider]  pravastatin (PRAVACHOL) 20 MG tablet Take 2 tablets (40 mg total) by mouth daily. Patient taking differently: Take 40 mg by mouth daily. Pt states she takes medication as needed 04/05/21  Yes Pokhrel,  Laxman, MD  Trospium Chloride 60 MG CP24 Take 1 capsule by mouth daily. 03/18/21  Yes [provider]  vitamin B-12 (CYANOCOBALAMIN) 100 MCG tablet Take 100 mcg by mouth daily.   Yes [provider]      VITAL SIGNS:  Blood pressure (!) 154/67, pulse (!) 59, temperature 97.9 F (36.6 C), resp. rate (!) 21, height '5\' 3"'$  (1.6 m), weight 56.2 kg, SpO2 99 %.  PHYSICAL EXAMINATION:  Physical Exam  GENERAL:  88 y.o.-year-old Caucasian female patient lying in the bed with no acute distress.  EYES: Pupils equal, round, reactive to light and accommodation. No scleral icterus. Extraocular muscles intact.  HEENT: Head atraumatic, normocephalic. Oropharynx and nasopharynx clear.  NECK:  Supple, no jugular venous distention. No thyroid enlargement, no tenderness.  LUNGS: Normal breath sounds bilaterally, no wheezing, rales,rhonchi or crepitation. No use of accessory muscles of respiration.  CARDIOVASCULAR: Regular rate and rhythm, S1, S2 normal. No murmurs, rubs, or gallops.  ABDOMEN: Soft, nondistended, nontender. Bowel sounds present. No organomegaly or mass.  EXTREMITIES: No pedal edema,  cyanosis, or clubbing.  NEUROLOGIC: Cranial nerves II through XII are intact. Muscle strength 5/5 in all extremities. Sensation intact. Gait not checked.  PSYCHIATRIC: The patient is alert and oriented x 3.  Normal affect and good eye contact. SKIN: No obvious rash, lesion, or ulcer.   LABORATORY PANEL:   CBC Recent Labs  Lab 10/18/21 1951  WBC 7.8  HGB 11.7*  HCT 31.3*  PLT 150   ------------------------------------------------------------------------------------------------------------------  Chemistries  Recent Labs  Lab 10/18/21 1951  NA 136  K 4.3  CL 104  CO2 24  GLUCOSE 94  BUN 31*  CREATININE 1.20*  CALCIUM 9.5  AST 17  ALT 13  ALKPHOS 40  BILITOT 0.7   ------------------------------------------------------------------------------------------------------------------  Cardiac Enzymes No results for input(s): "TROPONINI" in the last 168 hours. ------------------------------------------------------------------------------------------------------------------  RADIOLOGY:  MR BRAIN WO CONTRAST  Result Date: 10/19/2021 CLINICAL DATA:  Dizziness EXAM: MRI HEAD WITHOUT CONTRAST TECHNIQUE: Multiplanar, multiecho pulse sequences of the brain and surrounding structures were obtained without intravenous contrast. COMPARISON:  06/01/2021 FINDINGS: Brain: Left cerebellar and right occipital chronic microhemorrhage. No acute infarct or acute hemorrhage. There is multifocal hyperintense T2-weighted signal within the white matter. Generalized volume loss. The midline structures are normal. Vascular: Major flow voids are preserved. Skull and upper cervical spine: Normal calvarium and skull base. Visualized upper cervical spine and soft tissues are normal. Sinuses/Orbits:No paranasal sinus fluid levels or advanced mucosal thickening. No mastoid or middle ear effusion. Normal orbits. IMPRESSION: 1. No acute intracranial abnormality. 2. Findings of chronic small vessel ischemia and  volume loss. Electronically Signed   By: Ulyses Jarred M.D.   On: 10/19/2021 00:30   DG Chest 2 View  Result Date: 10/18/2021 CLINICAL DATA:  chest pain, SOB EXAM: CHEST - 2 VIEW COMPARISON:  Chest x-ray 06/18/2021 FINDINGS: The heart and mediastinal contours are unchanged. Aortic calcification. Biapical pleural/pulmonary scarring. Bilateral costophrenic angle scarring. No focal consolidation. No pulmonary edema. No pleural effusion. No pneumothorax. No acute osseous abnormality. IMPRESSION: 1. No active cardiopulmonary disease. 2.  Aortic Atherosclerosis (ICD10-I70.0). Electronically Signed   By: Iven Finn M.D.   On: 10/18/2021 21:10      IMPRESSION AND PLAN:  Assessment and Plan: * Chest pain - The patient will be admitted to an observation cardiac telemetry bed. - We will follow serial troponins. - She will be placed on aspirin as well as as needed sublingual nitroglycerin and morphine sulfate for  pain. - She has associated mild dyspnea and elevated BNP. - We will gently diurese and follow D-dimer. - We will check lipid panel in AM. - We will add D-dimer test.  He may need a chest CTA if it is elevated. - Cardiology consult will be obtained. - I notified Gay Filler about the patient.  Vertigo - She has been having associated left leg and foot numbness with mild unsteady gait. - Brain MRI without contrast came back negative for acute intracranial normalities. - We will place her on as needed Antivert. - She will be on aspirin as mentioned above.  Essential hypertension - We will continue her antihypertensives.  Abnormal weight loss - We will check her TSH. - Dietitian consult to be obtained.  History of TIA (transient ischemic attack) - We will continue her aspirin  Dyslipidemia - We will continue statin therapy as well as Zetia.       DVT prophylaxis: Lovenox.  Advanced Care Planning:  Code Status: full code.  Family Communication:  The plan of care was  discussed in details with the patient (and family). I answered all questions. The patient agreed to proceed with the above mentioned plan. Further management will depend upon hospital course. Disposition Plan: Back to previous home environment Consults called: Cardiology All the records are reviewed and case discussed with ED provider.    I certify that at the time of admission, it is my clinical judgment that the patient will require hospital care extending less than 2 midnights.                            Dispo: The patient is from: Home              Anticipated d/c is to: Home              Patient currently is not medically stable to d/c.              Difficult to place patient: No  Christel Mormon M.D on 10/19/2021 at 2:30 AM  [facility]   From 7 PM-7 AM, contact night-coverage [email or web address]  CC: Primary care physician; Lavone Orn, MD

## 2021-10-19 NOTE — Assessment & Plan Note (Addendum)
-   The patient will be admitted to an observation cardiac telemetry bed. - We will follow serial troponins. - She will be placed on aspirin as well as as needed sublingual nitroglycerin and morphine sulfate for pain. - She has associated mild dyspnea and elevated BNP. - We will gently diurese and follow D-dimer. - We will check lipid panel in AM. - We will add D-dimer test.  He may need a chest CTA if it is elevated. - Cardiology consult will be obtained. - I notified Gay Filler about the patient.

## 2021-10-19 NOTE — Consult Note (Addendum)
Cardiology Consultation   Patient ID: [name] MRN: [id number]; DOB: [birth year]  Admit date: 10/18/2021 Date of Consult: 10/19/2021  PCP:  Lavone Orn, MD   [facility] Cardiologist:  Glenetta Hew, MD        Patient Profile:   [name] is a 88 y.o. female with a hx of HLD, HTN, palpitations, GERD, TIA  and lower ext edema, DVT(no longer on anticoagulation) and wt loss  who is being seen 10/19/2021 for the evaluation of chest pain and presyncope at the request of Dr Verneita Griffes.  History of Present Illness:   Ms. [name] with hx as above last echo 03/2021 with EF 60-65%, G2DD, mild MR, TR mild, AVA calculates low but DVI 0.44 and mean gradient only 12 mmHg. The aortic valve is tricuspid. There is moderate calcification of the aortic valve. There is moderate thickening of the aortic valve. Aortic  valve regurgitation is trivial. Mild to moderate aortic stenosis is present. Aortic valve mean gradient  measures 12.0 mmHg. Aortic valve peak gradient measures 20.7 mmHg. Aortic valve area, by VTI measures 1.12 cm   remote 2020 with 30 day monitor with short bursts of PAT/SVT  no further issues.    Nuc study in 2018 low risk with normal EF.     Now admitted after presenting 10/18/21 with chest pain, SOB and dizziness and near syncope.  Her EKG with SB at 56 and sinus arrhythmia incomplete RBBB.    On review with 05/2021 EKG no acute changes. And she did have RBBB and HR was the same.   Her chest pain first described as palpitations then pain, + dizziness when she stands up and is off balance, has left leg numbness and weakness.  Has back pain and had injection a few weeks ago.  She had rec'd 50 IV benadryl and 50 po, stated she could not swallow but she was talking without difficulty no facial edema.  She was anxious. Had difficulty answering questions- becomes distracted.  She is afraid something was happening to her.   She had trouble swallowing because her mouth was  dry.  She did better after few sips of water.  She was reassured no stroke, no heart attack and lungs were clear.  She is for CTA of chest but needs to be premedicatied.  Her MRI of brain without acute process.    She was given IV lasix 20 mg every 12 hours and she has diuresed.  No I &  O  BNP 189 Hs troponin 14,12 Ddimer 0.58 Na 136 K+ 4.3 BUN 31, Cr 1.20  WBC 7.8  Hgb 11.7 plts 150  CTA chest ordered  2 V CXR no active cardiopulmonary disease, aortic atherosclerosis.  MRI of Brain 10/19/21 IMPRESSION: 1. No acute intracranial abnormality. 2. Findings of chronic small vessel ischemia and volume loss  BP 175/40 to 129/88 P 71 R 16 afebrile.   Past Medical History:  Diagnosis Date   Abnormal Pap smear 1975-76   Acute deep vein thrombosis (DVT) of left peroneal vein (HCC)    Anemia    history of   Arthritis    spine   Breast cyst, left 1980   Cystocele 2012   Diverticulosis    with h/o Diverticulitis   DVT, lower extremity, distal, chronic ([place]) 12/24/2018   GERD (gastroesophageal reflux disease)    H/O hypercholesterolemia    H/O osteopenia    H/O varicella    H/O varicose veins  Hx of Mumps    Macular degeneration    Seasonal allergies    TIA (transient ischemic attack)    Urge incontinence 2012   Urinary frequency 2010    Past Surgical History:  Procedure Laterality Date   ABDOMINAL HYSTERECTOMY     BLADDER SUSPENSION N/A 06/16/2016   Procedure: TRANSVAGINAL TAPE (TVT) PROCEDURE;  Surgeon: Everett Graff, MD;  Location: [facility];  Service: Gynecology;  Laterality: N/A;   BREAST CYST ASPIRATION  1964   COLONOSCOPY     Dr. Jodene Nam REPAIR N/A 06/16/2016   Procedure: ANTERIOR REPAIR (CYSTOCELE);  Surgeon: Everett Graff, MD;  Location: [facility];  Service: Gynecology;  Laterality: N/A;   CYSTOSCOPY N/A 06/16/2016   Procedure: CYSTOSCOPY;  Surgeon: Everett Graff, MD;  Location: [facility];  Service: Gynecology;  Laterality: N/A;  see anterior repair   Lower  Extremity Venous Dopplers  01/09/2012   Right and left lower steroids: No evidence of thrombus or, thrombophlebitis; right and left GSV and SSV: No venous insufficiency. Normal exam.   NM MYOVIEW LTD  11/2016   6.6 METS.  Reached 106% of max.  Heart rate.  Walk for 4:40 min.  EF 72%.  Normal blood pressure response.  upsloping ST segment depression, nonspecific.  Otherwise normal study.  LOW RISK.  No evidence of ischemia or infarction.   TRANSTHORACIC ECHOCARDIOGRAM  01/2018   EF > 65%. LV size small. Gr 1 DD (normal for age).   Normal atrial size. Moderate Aortic Sclerosis without Stenosis. Moderate Mitral Annular Calcification with mild MR & no MS     Home meds  Prior to Admission medications   Medication Sig Start Date End Date Taking? Authorizing Provider  acetaminophen (TYLENOL) 500 MG tablet Take 500-1,000 mg by mouth every 8 (eight) hours as needed for mild pain.      Yes [provider]  albuterol (VENTOLIN HFA) 108 (90 Base) MCG/ACT inhaler Inhale 2 puffs into the lungs every 4 (four) hours as needed for wheezing or shortness of breath.  01/30/19   Yes [provider]  aspirin EC 81 MG EC tablet Take 1 tablet (81 mg total) by mouth daily. Swallow whole. 04/06/21   Yes Pokhrel, Laxman, MD  Cholecalciferol (VITAMIN D-3) 25 MCG (1000 UT) CAPS Take 1,000 Units by mouth daily with breakfast.      Yes [provider]  ezetimibe (ZETIA) 10 MG tablet Take 10 mg by mouth daily. 02/05/20   Yes [provider]  Lactobacillus Rhamnosus, GG, (CULTURELLE) CAPS Take 1 capsule by mouth every morning.      Yes [provider]  Magnesium 250 MG TABS Take 250 mg by mouth daily as needed (leg cramps).     Yes [provider]  metoprolol succinate (TOPROL-XL) 25 MG 24 hr tablet Take 25 mg by mouth daily. [place] sprinkle 06/04/21   Yes [provider]  Multiple Vitamins-Minerals (AIRBORNE PO) Take 1 tablet by mouth daily with breakfast.     Yes  [provider]  Multiple Vitamins-Minerals (CENTRUM SILVER ULTRA WOMENS) TABS Take 1 tablet by mouth daily with breakfast.      Yes [provider]  Multiple Vitamins-Minerals (ICAPS AREDS 2 PO) Take 1 capsule by mouth daily at 12 noon.     Yes [provider]  pravastatin (PRAVACHOL) 20 MG tablet Take 2 tablets (40 mg total) by mouth daily. Patient taking differently: Take 40 mg by mouth daily. Pt states she takes medication as needed 04/05/21  Yes Pokhrel, Laxman, MD  Trospium Chloride 60 MG CP24 Take 1 capsule by mouth daily. 03/18/21   Yes [provider]  vitamin B-12 (CYANOCOBALAMIN) 100 MCG tablet Take 100 mcg by mouth daily.     Yes [provider]    Inpatient Medications: Scheduled Meds:  acidophilus  1 capsule Oral q morning   aspirin EC  81 mg Oral Daily   cholecalciferol  1,000 Units Oral Q breakfast   diphenhydrAMINE  50 mg Oral Once   Or   diphenhydrAMINE  50 mg Intravenous Once   enoxaparin (LOVENOX) injection  30 mg Subcutaneous Q24H   ezetimibe  10 mg Oral Daily   furosemide  20 mg Intravenous Q12H   guaiFENesin  600 mg Oral BID   metoprolol succinate  25 mg Oral Daily   multivitamin with minerals  1 tablet Oral Q breakfast   pravastatin  40 mg Oral q1800   predniSONE  50 mg Oral Q6H   Trospium Chloride  1 capsule Oral Daily   vitamin B-12  100 mcg Oral Daily   Continuous Infusions:  PRN Meds: acetaminophen, albuterol, ALPRAZolam, magnesium hydroxide, magnesium oxide, meclizine, ondansetron (ZOFRAN) IV, traZODone  Allergies:    Allergies  Allergen Reactions   Atorvastatin Other (See Comments)    Unknown reaction   Desmopressin Acetate Other (See Comments)   Iodinated Contrast Media Hives and Other (See Comments)   Myrbetriq [Mirabegron] Other (See Comments)    "Makes me urinate more frequently"   Iodine Rash    Social History:   Social History   Socioeconomic History   Marital status: Married    Spouse  name: Not on file   Number of children: 3   Years of education: Not on file   Highest education level: Master's degree (e.g., MA, MS, MEng, MEd, MSW, MBA)  Occupational History    Comment: retired Pharmacist, hospital, principal x 21 years  Tobacco Use   Smoking status: Never   Smokeless tobacco: Never  Vaping Use   Vaping Use: Never used  Substance and Sexual Activity   Alcohol use: No   Drug use: No   Sexual activity: Yes    Birth control/protection: Surgical    Comment: Hysterectomy  Other Topics Concern   Not on file  Social History Narrative   She is a married mother of 65, grandmother of 72, great grandmother of 1. She walks daily about 2 miles a day, does not smoke, does not drink.    Lives with huband   Social Determinants of Health   Financial Resource Strain: Not on file  Food Insecurity: Not on file  Transportation Needs: Not on file  Physical Activity: Not on file  Stress: Not on file  Social Connections: Not on file  Intimate Partner Violence: Not on file    Family History:    Family History  Problem Relation Age of Onset   Uterine cancer Mother 26   Heart attack Brother 43   Breast cancer Daughter    Stroke Neg Hx      ROS:  Please see the history of present illness.  General:no colds or fevers, no weight changes from 03/19/21  if accurate Skin:no rashes or ulcers HEENT:no blurred vision, no congestion + head spinning CV:see HPI PUL:see HPI GI:no diarrhea constipation or melena, no indigestion GU:no hematuria, no dysuria MS:no joint pain, no claudication Neuro:no syncope, ++ lightheadedness room spinning happens at home and has had vertigo in the past. Endo:no diabetes, no thyroid disease  All  other ROS reviewed and negative.     Physical Exam/Data:   Vitals:   10/19/21 0745 10/19/21 0750 10/19/21 0930 10/19/21 0945  BP: 107/60  129/88   Pulse: 64  (!) 54 71  Resp: '11  15 19  '$ Temp:  98.2 F (36.8 C)    TempSrc:  Oral    SpO2: 97%  98% 100%  Weight:       Height:       No intake or output data in the 24 hours ending 10/19/21 1111    10/18/2021    7:45 PM 06/24/2021    1:18 PM 05/24/2021    9:04 AM  Last 3 Weights  Weight (lbs) 124 lb 124 lb 127 lb  Weight (kg) 56.246 kg 56.246 kg 57.607 kg     Body mass index is 21.97 kg/m.  General:  frail female very anxious but no SOB, HEENT: normal Neck: no JVD Vascular: No carotid bruits; Distal pulses 2+ bilaterally Cardiac:  normal S1, S2; RRR; no murmur but harsh heart sounds Lungs:  clear to auscultation bilaterally, no wheezing, rhonchi or rales  Abd: soft, nontender, no hepatomegaly  Ext: no edema Musculoskeletal:  No deformities, BUE and BLE strength normal and equal Skin: warm and dry  Neuro:  alert and oriented X 3 MAE follows commands, no focal abnormalities noted Psych:  Normal affect, anxious   EKG:  The EKG was personally reviewed and demonstrates:  SB at 56 incomplete RBBB, no acute ST changes  Telemetry:  Telemetry was personally reviewed and demonstrates:  SB in upper 50s and SR in 60s  Relevant CV Studies: Echo 04/05/21 IMPRESSIONS     1. Left ventricular ejection fraction, by estimation, is 60 to 65%. The  left ventricle has normal function. The left ventricle has no regional  wall motion abnormalities. Left ventricular diastolic parameters are  consistent with Grade II diastolic  dysfunction (pseudonormalization). Elevated left ventricular end-diastolic  pressure.   2. Right ventricular systolic function is normal. The right ventricular  size is normal. There is normal pulmonary artery systolic pressure.   3. The mitral valve is degenerative. Mild mitral valve regurgitation. No  evidence of mitral stenosis. Moderate mitral annular calcification.   4. AVA calculates low but DVI 0.44 and mean gradient only 12 mmHg . The  aortic valve is tricuspid. There is moderate calcification of the aortic  valve. There is moderate thickening of the aortic valve. Aortic valve   regurgitation is trivial. Mild to  moderate aortic valve stenosis.   5. Pulmonic valve regurgitation is moderate.   6. The inferior vena cava is normal in size with greater than 50%  respiratory variability, suggesting right atrial pressure of 3 mmHg.   FINDINGS   Left Ventricle: Left ventricular ejection fraction, by estimation, is 60  to 65%. The left ventricle has normal function. The left ventricle has no  regional wall motion abnormalities. The left ventricular internal cavity  size was normal in size. There is   no left ventricular hypertrophy. Left ventricular diastolic parameters  are consistent with Grade II diastolic dysfunction (pseudonormalization).  Elevated left ventricular end-diastolic pressure.   Right Ventricle: The right ventricular size is normal. No increase in  right ventricular wall thickness. Right ventricular systolic function is  normal. There is normal pulmonary artery systolic pressure. The tricuspid  regurgitant velocity is 2.54 m/s, and   with an assumed right atrial pressure of 3 mmHg, the estimated right  ventricular systolic pressure is 25.9 mmHg.  Left Atrium: Left atrial size was normal in size.   Right Atrium: Right atrial size was normal in size.   Pericardium: There is no evidence of pericardial effusion.   Mitral Valve: The mitral valve is degenerative in appearance. There is  moderate thickening of the mitral valve leaflet(s). There is moderate  calcification of the mitral valve leaflet(s). Moderate mitral annular  calcification. Mild mitral valve  regurgitation. No evidence of mitral valve stenosis. MV peak gradient, 8.9  mmHg. The mean mitral valve gradient is 3.0 mmHg.   Tricuspid Valve: The tricuspid valve is normal in structure. Tricuspid  valve regurgitation is mild . No evidence of tricuspid stenosis.   Aortic Valve: AVA calculates low but DVI 0.44 and mean gradient only 12  mmHg. The aortic valve is tricuspid. There is moderate  calcification of  the aortic valve. There is moderate thickening of the aortic valve. Aortic  valve regurgitation is trivial.  Mild to moderate aortic stenosis is present. Aortic valve mean gradient  measures 12.0 mmHg. Aortic valve peak gradient measures 20.7 mmHg. Aortic  valve area, by VTI measures 1.12 cm.   Pulmonic Valve: The pulmonic valve was normal in structure. Pulmonic valve  regurgitation is moderate. No evidence of pulmonic stenosis.   Aorta: The aortic root is normal in size and structure.   Venous: The inferior vena cava is normal in size with greater than 50%  respiratory variability, suggesting right atrial pressure of 3 mmHg.   IAS/Shunts: No atrial level shunt detected by color flow Doppler.    Laboratory Data:  High Sensitivity Troponin:   Recent Labs  Lab 10/18/21 1951 10/18/21 2231  TROPONINIHS 12 14     Chemistry Recent Labs  Lab 10/18/21 1951  NA 136  K 4.3  CL 104  CO2 24  GLUCOSE 94  BUN 31*  CREATININE 1.20*  CALCIUM 9.5  GFRNONAA 42*  ANIONGAP 8    Recent Labs  Lab 10/18/21 1951  PROT 6.7  ALBUMIN 3.8  AST 17  ALT 13  ALKPHOS 40  BILITOT 0.7   Lipids No results for input(s): "CHOL", "TRIG", "HDL", "LABVLDL", "LDLCALC", "CHOLHDL" in the last 168 hours.  Hematology Recent Labs  Lab 10/18/21 1951  WBC 7.8  RBC 3.71*  HGB 11.7*  HCT 31.3*  MCV 84.4  MCH 31.5  MCHC 37.4*  RDW 13.7  PLT 150   Thyroid No results for input(s): "TSH", "FREET4" in the last 168 hours.  BNP Recent Labs  Lab 10/18/21 2221  BNP 189.5*    DDimer  Recent Labs  Lab 10/18/21 2231  DDIMER 0.58*     Radiology/Studies:  MR BRAIN WO CONTRAST  Result Date: 10/19/2021 CLINICAL DATA:  Dizziness EXAM: MRI HEAD WITHOUT CONTRAST TECHNIQUE: Multiplanar, multiecho pulse sequences of the brain and surrounding structures were obtained without intravenous contrast. COMPARISON:  06/01/2021 FINDINGS: Brain: Left cerebellar and right occipital chronic  microhemorrhage. No acute infarct or acute hemorrhage. There is multifocal hyperintense T2-weighted signal within the white matter. Generalized volume loss. The midline structures are normal. Vascular: Major flow voids are preserved. Skull and upper cervical spine: Normal calvarium and skull base. Visualized upper cervical spine and soft tissues are normal. Sinuses/Orbits:No paranasal sinus fluid levels or advanced mucosal thickening. No mastoid or middle ear effusion. Normal orbits. IMPRESSION: 1. No acute intracranial abnormality. 2. Findings of chronic small vessel ischemia and volume loss. Electronically Signed   By: Ulyses Jarred M.D.   On: 10/19/2021 00:30   DG Chest 2 View  Result Date: 10/18/2021 CLINICAL DATA:  chest pain, SOB EXAM: CHEST - 2 VIEW COMPARISON:  Chest x-ray 06/18/2021 FINDINGS: The heart and mediastinal contours are unchanged. Aortic calcification. Biapical pleural/pulmonary scarring. Bilateral costophrenic angle scarring. No focal consolidation. No pulmonary edema. No pleural effusion. No pneumothorax. No acute osseous abnormality. IMPRESSION: 1. No active cardiopulmonary disease. 2.  Aortic Atherosclerosis (ICD10-I70.0). Electronically Signed   By: Iven Finn M.D.   On: 10/18/2021 21:10     Assessment and Plan:   Chest pain with neg troponin and no acute EKG changes.   Last nuc 2018 was low risk. ASA was added.  Keep overnight but most likely discharge tomorrow. Vertigo/near syncope  no change in HR on EKGs  has just rec'd 50 IV benadryl and 50 po, she has prn antivert  --keep on tele to eval HR - she is on BB but doubt her HR will be very low.  Her RBBB is chronic. HTN labile here ? If causing dizziness and near syncope-- once stable check orthostatic hypotension.  Rec'd lasix 20 mg IV every 12 hours may need to make prn currently Wt loss - none since March, if accurate would weigh daily and agree with dietician to see. 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 Hx TIA - MRI without acute findings. Anxiety fearful of acute process   Risk Assessment/Risk Scores:                For questions or updates, please contact [place] Please consult [email or web address] for contact info under    Signed, Cecilie Kicks, NP  10/19/2021 11:11 AM  I have examined the patient and reviewed assessment and plan and discussed with patient.  Agree with above as stated.    Patient is quite anxious.  She felt like she had difficulty swallowing after getting IV benadryl.  Negative troponins.  No ischemic changes on ECG.  Would not pursue ischemic testing.  She also reported some palpitations.  Watch for arrhythmia on tele.  Would treat conservatively given the lack of high risk markers at this time.  Reassurance given to the patient that thus far, cardiac test results were normal.   Larae Grooms

## 2021-10-20 ENCOUNTER — Observation Stay (HOSPITAL_COMMUNITY): Payer: Medicare PPO

## 2021-10-20 DIAGNOSIS — I10 Essential (primary) hypertension: Secondary | ICD-10-CM | POA: Diagnosis not present

## 2021-10-20 DIAGNOSIS — E785 Hyperlipidemia, unspecified: Secondary | ICD-10-CM | POA: Diagnosis not present

## 2021-10-20 DIAGNOSIS — R072 Precordial pain: Secondary | ICD-10-CM

## 2021-10-20 DIAGNOSIS — R0789 Other chest pain: Secondary | ICD-10-CM | POA: Diagnosis not present

## 2021-10-20 DIAGNOSIS — T80818A Extravasation of other vesicant agent, initial encounter: Secondary | ICD-10-CM

## 2021-10-20 DIAGNOSIS — R42 Dizziness and giddiness: Secondary | ICD-10-CM | POA: Diagnosis not present

## 2021-10-20 LAB — CBC WITH DIFFERENTIAL/PLATELET
Abs Immature Granulocytes: 0.04 10*3/uL (ref 0.00–0.07)
Basophils Absolute: 0 10*3/uL (ref 0.0–0.1)
Basophils Relative: 0 %
Eosinophils Absolute: 0 10*3/uL (ref 0.0–0.5)
Eosinophils Relative: 0 %
HCT: 38.6 % (ref 36.0–46.0)
Hemoglobin: 13.9 g/dL (ref 12.0–15.0)
Immature Granulocytes: 0 %
Lymphocytes Relative: 15 %
Lymphs Abs: 1.3 10*3/uL (ref 0.7–4.0)
MCH: 30.1 pg (ref 26.0–34.0)
MCHC: 36 g/dL (ref 30.0–36.0)
MCV: 83.5 fL (ref 80.0–100.0)
Monocytes Absolute: 0.1 10*3/uL (ref 0.1–1.0)
Monocytes Relative: 1 %
Neutro Abs: 7.7 10*3/uL (ref 1.7–7.7)
Neutrophils Relative %: 84 %
Platelets: 183 10*3/uL (ref 150–400)
RBC: 4.62 MIL/uL (ref 3.87–5.11)
RDW: 13.2 % (ref 11.5–15.5)
WBC: 9.2 10*3/uL (ref 4.0–10.5)
nRBC: 0 % (ref 0.0–0.2)

## 2021-10-20 LAB — LIPID PANEL
Cholesterol: 214 mg/dL — ABNORMAL HIGH (ref 0–200)
HDL: 81 mg/dL (ref >40)
LDL Cholesterol: 129 mg/dL — ABNORMAL HIGH (ref 0–99)
Total CHOL/HDL Ratio: 2.6 RATIO
Triglycerides: 20 mg/dL (ref <150)
VLDL: 4 mg/dL (ref 0–40)

## 2021-10-20 LAB — BASIC METABOLIC PANEL
Anion gap: 12 (ref 5–15)
BUN: 40 mg/dL — ABNORMAL HIGH (ref 8–23)
CO2: 27 mmol/L (ref 22–32)
Calcium: 10.1 mg/dL (ref 8.9–10.3)
Chloride: 98 mmol/L (ref 98–111)
Creatinine, Ser: 1.49 mg/dL — ABNORMAL HIGH (ref 0.44–1.00)
GFR, Estimated: 33 mL/min — ABNORMAL LOW (ref >60)
Glucose, Bld: 170 mg/dL — ABNORMAL HIGH (ref 70–99)
Potassium: 4.2 mmol/L (ref 3.5–5.1)
Sodium: 137 mmol/L (ref 135–145)

## 2021-10-20 LAB — TROPONIN I (HIGH SENSITIVITY): Troponin I (High Sensitivity): 19 ng/L — ABNORMAL HIGH (ref <18)

## 2021-10-20 MED ORDER — IOHEXOL 350 MG/ML SOLN
1.0000 mL | Freq: Once | INTRAVENOUS | Status: AC | PRN
  Administered 2021-10-20: 1 mL via INTRAVENOUS

## 2021-10-20 MED ORDER — OXYCODONE HCL 5 MG PO TABS: 5.0000 mg | ORAL_TABLET | ORAL | Status: DC | PRN

## 2021-10-20 MED ORDER — MECLIZINE HCL 25 MG PO TABS
12.5000 mg | ORAL_TABLET | Freq: Three times a day (TID) | ORAL | Status: DC
  Administered 2021-10-20 – 2021-10-22 (×6): 12.5 mg via ORAL
  Filled 2021-10-20 (×6): qty 1

## 2021-10-20 MED ORDER — IOHEXOL 350 MG/ML SOLN
50.0000 mL | Freq: Once | INTRAVENOUS | Status: AC | PRN
  Administered 2021-10-20: 50 mL via INTRAVENOUS

## 2021-10-20 MED ORDER — PANTOPRAZOLE SODIUM 40 MG PO TBEC
40.0000 mg | DELAYED_RELEASE_TABLET | Freq: Every day | ORAL | Status: DC
  Administered 2021-10-20: 40 mg via ORAL
  Filled 2021-10-20: qty 1

## 2021-10-20 NOTE — TOC Initial Note (Signed)
Transition of Care [facility]) - Initial/Assessment Note    Patient Details  Name: [name] MRN: [id number] Date of Birth: February 22, 1927  [facility] CM/SW Contact:    Zenon Mayo, RN Phone Number: 10/20/2021, 3:55 PM  Clinical Narrative:                 NCM spoke with patient at the bedside, she is form home with spouse, she is pretty indep, she does have a walker at home.  NCM offered choice for HHPT, she states she would like to try [place] , NCM made referral to [facility] with [facility] for [place]. He states he can take referral. Soc will begin 24 to 48 hrs post dc.  Per MD would like to check her renal function.  TOC following.   Expected Discharge Plan: [place] Barriers to Discharge: Continued Medical Work up   Patient Goals and CMS Choice Patient states their goals for this hospitalization and ongoing recovery are:: return home CMS Medicare.gov Compare Post Acute Care list provided to:: Patient Choice offered to / list presented to : Patient  Expected Discharge Plan and Services Expected Discharge Plan: [place] In-house Referral: NA Discharge Planning Services: CM Consult Post Acute Care Choice: [place] arrangements for the past 2 months: Single Family Home                   DME Agency: NA       HH Arranged: PT HH Agency: [place] Date [facility] Agency Contacted: 10/20/21 Time HH Agency Contacted: 1027 Representative spoke with at [place]: Tommi Rumps  Prior Living Arrangements/Services Living arrangements for the past 2 months: [place] Lives with:: Spouse Patient language and need for interpreter reviewed:: Yes Do you feel safe going back to the place where you live?: Yes      Need for Family Participation in Patient Care: Yes (Comment) Care giver support system in place?: Yes (comment)   Criminal Activity/Legal Involvement Pertinent to Current Situation/Hospitalization: No - Comment as  needed  Activities of Daily Living      Permission Sought/Granted                  Emotional Assessment Appearance:: Appears stated age Attitude/Demeanor/Rapport: Engaged Affect (typically observed): Appropriate Orientation: : Oriented to Self, Oriented to Place, Oriented to  Time, Oriented to Situation Alcohol / Substance Use: Not Applicable Psych Involvement: No (comment)  Admission diagnosis:  Dizziness and giddiness [R42] Dyspnea on exertion [R06.09] Chest pain [R07.9] Chest pain, unspecified type [R07.9] Patient Active Problem List   Diagnosis Date Noted   Chest pain 10/19/2021   Dyslipidemia 10/19/2021   History of TIA (transient ischemic attack) 10/19/2021   Vertigo 10/19/2021   Abnormal weight loss 10/19/2021   History of DVT of lower extremity 04/04/2021   Chronic kidney disease, stage 3a ([place]) 04/04/2021   AKI (acute kidney injury) ([place]) 04/04/2021   Unintentional weight loss 04/04/2021   Dyslipidemia, goal LDL below 100 04/12/2020   Palpitations 05/30/2018   Irregular heartbeat 03/02/2018   TIA (transient ischemic attack) 02/15/2018   Urinary, incontinence, stress female 06/16/2016   Right carotid bruit 12/23/2014   Exertional dyspnea 12/24/2013   Bilateral arm numbness and tingling while sleeping - and shortly after waking 12/24/2013   Poor balance 12/24/2013   Edema of left lower extremity 12/21/2012   Essential hypertension    H/O hypercholesterolemia    Osteoporosis - of the  spine 09/22/2011   PCP:  Lavone Orn, MD Pharmacy:   [id number] [place] [address]Phone: [phone]: [phone]    Social Determinants of Health (SDOH) Interventions    Readmission Risk Interventions     No data to display

## 2021-10-20 NOTE — Progress Notes (Signed)
   10/20/21 1635  Mobility  Activity Ambulated with assistance in hallway  Activity Response Tolerated well  Distance Ambulated (ft) 300 ft  $Mobility charge 1 Mobility  Level of Assistance Contact guard assist, steadying assist  Assistive Device Front wheel walker  Mobility Referral Yes   Mobility Specialist Progress Note  Pt was in bed and agreeable. Had c/o chest pain after ambulation. Returned to bed w/ all needs met and call bell in reach. RN notified.  Lucious Groves Mobility Specialist

## 2021-10-20 NOTE — Hospital Course (Addendum)
PMH of DVT, TIA, CKD 3A, HTN presented to hospital with complaints of chest pain, dizziness and shortness of breath.  Work-up so far is unremarkable including a negative CT chest, unremarkable EKG as well as negative troponins.  Cardiology was consulted, recommended no inpatient work-up required. Esophagogram was also performed which showed severe dysmotility.  Outpatient follow-up with GI as needed if desired by the family.

## 2021-10-20 NOTE — Care Management Obs Status (Signed)
[place] NOTIFICATION   Patient Details  Name: [name] MRN: [id number] Date of Birth: September 04, 1927   Medicare Observation Status Notification Given:  Yes    [place], LCSW 10/20/2021, 11:56 AM

## 2021-10-20 NOTE — Progress Notes (Addendum)
Rounding Note    Patient Name: [name] Date of Encounter: 10/20/2021  [place] Cardiologist: Glenetta Hew, MD   Subjective   Feels weak. Got up to the scale this morning and felt like she was going to pass out. No chest pain this morning.   Inpatient Medications    Scheduled Meds:  acidophilus  1 capsule Oral q morning   aspirin EC  81 mg Oral Daily   cholecalciferol  1,000 Units Oral Q breakfast   enoxaparin (LOVENOX) injection  30 mg Subcutaneous Q24H   ezetimibe  10 mg Oral Daily   fesoterodine  4 mg Oral Daily   guaiFENesin  600 mg Oral BID   metoprolol succinate  25 mg Oral Daily   multivitamin with minerals  1 tablet Oral Q breakfast   pravastatin  40 mg Oral q1800   vitamin B-12  100 mcg Oral Daily   Continuous Infusions:  PRN Meds: acetaminophen, albuterol, ALPRAZolam, meclizine, ondansetron (ZOFRAN) IV, traZODone   Vital Signs    Vitals:   10/19/21 1944 10/20/21 0001 10/20/21 0442 10/20/21 0718  BP: (!) 161/83 133/82 124/73 127/74  Pulse: 87 85 74 75  Resp: '15 16 12 17  '$ Temp: 97.6 F (36.4 C) 97.8 F (36.6 C) 97.6 F (36.4 C) (!) 97.4 F (36.3 C)  TempSrc: Oral Oral Oral Oral  SpO2: 99% 97% 97% 95%  Weight:   55.1 kg   Height:        Intake/Output Summary (Last 24 hours) at 10/20/2021 0928 Last data filed at 10/20/2021 0002 Gross per 24 hour  Intake --  Output 750 ml  Net -750 ml      10/20/2021    4:42 AM 10/18/2021    7:45 PM 06/24/2021    1:18 PM  Last 3 Weights  Weight (lbs) 121 lb 6.4 oz 124 lb 124 lb  Weight (kg) 55.067 kg 56.246 kg 56.246 kg      Telemetry    Sinus Rhythm, rates 50-70s, PVCs - Personally Reviewed  ECG    No new tracing this morning.   Physical Exam   GEN: No acute distress.   Neck: No JVD Cardiac: RRR, + systolic murmur RUSB, no rubs, or gallops.  Respiratory: Clear to auscultation bilaterally. GI: Soft, nontender, non-distended  MS: No edema; No deformity. Neuro:  Nonfocal  Psych:  Normal affect   Labs    High Sensitivity Troponin:   Recent Labs  Lab 10/18/21 1951 10/18/21 2231  TROPONINIHS 12 14     Chemistry Recent Labs  Lab 10/18/21 1951 10/20/21 0101  NA 136 137  K 4.3 4.2  CL 104 98  CO2 24 27  GLUCOSE 94 170*  BUN 31* 40*  CREATININE 1.20* 1.49*  CALCIUM 9.5 10.1  PROT 6.7  --   ALBUMIN 3.8  --   AST 17  --   ALT 13  --   ALKPHOS 40  --   BILITOT 0.7  --   GFRNONAA 42* 33*  ANIONGAP 8 12    Lipids  Recent Labs  Lab 10/20/21 0101  CHOL 214*  TRIG 20  HDL 81  LDLCALC 129*  CHOLHDL 2.6    Hematology Recent Labs  Lab 10/18/21 1951 10/20/21 0101  WBC 7.8 9.2  RBC 3.71* 4.62  HGB 11.7* 13.9  HCT 31.3* 38.6  MCV 84.4 83.5  MCH 31.5 30.1  MCHC 37.4* 36.0  RDW 13.7 13.2  PLT 150 183   Thyroid  Recent Labs  Lab 10/19/21 1745  TSH 0.628    BNP Recent Labs  Lab 10/18/21 2221  BNP 189.5*    DDimer  Recent Labs  Lab 10/18/21 2231  DDIMER 0.58*     Radiology    CT Angio Chest Pulmonary Embolism (PE) W or WO Contrast  Result Date: 10/20/2021 CLINICAL DATA:  Chest wall pain EXAM: CT ANGIOGRAPHY CHEST WITH CONTRAST TECHNIQUE: Multidetector CT imaging of the chest was performed using the standard protocol during bolus administration of intravenous contrast. Multiplanar CT image reconstructions and MIPs were obtained to evaluate the vascular anatomy. RADIATION DOSE REDUCTION: This exam was performed according to the departmental dose-optimization program which includes automated exposure control, adjustment of the mA and/or kV according to patient size and/or use of iterative reconstruction technique. CONTRAST:  50 mL OMNIPAQUE IOHEXOL 350 MG/ML SOLN COMPARISON:  Chest radiograph dated 10/18/2021. CTA chest dated 02/06/2019. FINDINGS: Cardiovascular: Satisfactory opacification the bilateral pulmonary arteries to the lobar level. No evidence of pulmonary embolism. Although not tailored for evaluation of the thoracic aorta, there  is no evidence thoracic aortic aneurysm or dissection. The heart is normal in size.  No pericardial effusion. Mild coronary atherosclerosis of the LAD. Mediastinum/Nodes: No suspicious mediastinal lymphadenopathy. Visualized thyroid is unremarkable. Lungs/Pleura: Mild dependent atelectasis in the bilateral upper and lower lobes. Additional compressive atelectasis in the medial left lower lobe and patchy right lower lobe atelectasis. No focal consolidation. No suspicious pulmonary nodules. No pleural effusion or pneumothorax. Upper Abdomen: Visualized upper abdomen is grossly unremarkable. Musculoskeletal: Mild degenerative changes of the thoracic spine. No rib fracture is seen. Review of the MIP images confirms the above findings. IMPRESSION: No evidence of pulmonary embolism. No rib fracture is seen. No evidence of acute cardiopulmonary disease. Electronically Signed   By: Julian Hy M.D.   On: 10/20/2021 03:50   MR BRAIN WO CONTRAST  Result Date: 10/19/2021 CLINICAL DATA:  Dizziness EXAM: MRI HEAD WITHOUT CONTRAST TECHNIQUE: Multiplanar, multiecho pulse sequences of the brain and surrounding structures were obtained without intravenous contrast. COMPARISON:  06/01/2021 FINDINGS: Brain: Left cerebellar and right occipital chronic microhemorrhage. No acute infarct or acute hemorrhage. There is multifocal hyperintense T2-weighted signal within the white matter. Generalized volume loss. The midline structures are normal. Vascular: Major flow voids are preserved. Skull and upper cervical spine: Normal calvarium and skull base. Visualized upper cervical spine and soft tissues are normal. Sinuses/Orbits:No paranasal sinus fluid levels or advanced mucosal thickening. No mastoid or middle ear effusion. Normal orbits. IMPRESSION: 1. No acute intracranial abnormality. 2. Findings of chronic small vessel ischemia and volume loss. Electronically Signed   By: Ulyses Jarred M.D.   On: 10/19/2021 00:30   DG Chest 2  View  Result Date: 10/18/2021 CLINICAL DATA:  chest pain, SOB EXAM: CHEST - 2 VIEW COMPARISON:  Chest x-ray 06/18/2021 FINDINGS: The heart and mediastinal contours are unchanged. Aortic calcification. Biapical pleural/pulmonary scarring. Bilateral costophrenic angle scarring. No focal consolidation. No pulmonary edema. No pleural effusion. No pneumothorax. No acute osseous abnormality. IMPRESSION: 1. No active cardiopulmonary disease. 2.  Aortic Atherosclerosis (ICD10-I70.0). Electronically Signed   By: Iven Finn M.D.   On: 10/18/2021 21:10    Cardiac Studies   N/a   Patient Profile     88 y.o. female with a hx of HLD, HTN, palpitations, GERD, TIA  and lower ext edema, DVT(no longer on anticoagulation) and wt loss  who was seen 10/19/2021 for the evaluation of chest pain and presyncope at the request of Dr Verneita Griffes.  Assessment & Plan    Chest pain --  neg troponin and no acute EKG changes.   Last nuc 2018 was low risk. No chest pain this morning, but feels palpitations at times.  PVCs noted on telemetry. Continues to feel weak. CT angio negative for PE. -- continue ASA, statin  Vertigo/near syncope   -- continues to feel near syncopal. This morning had recurrent episode while sitting up to get on the scale. Has meclizine ordered but not received any -- blood pressures initially labile -- will check orthostatics  HTN  -- Initially elevated, but now improved -- on metoprolol XL '25mg'$  daily -- as above check orthostatics   Hx TIA  -- MRI without acute findings, chronic small vessel ischemia -- on ASA, statin and Zetia  HLD -- on statin and Zetia   For questions or updates, please contact [place] Please consult [email or web address] for contact info under        Signed, Reino Bellis, NP  10/20/2021, 9:28 AM    I have examined the patient and reviewed assessment and plan and discussed with patient.  Agree with above as stated.    Negative cardiac w/u thus  far.  Tele shows NSR with PVCs. No significant arrhythmias. I updated her son.  No further cardiac testing planned at this time.  She reports generalized weakness.  Monitor BP. Consider PT.   Larae Grooms

## 2021-10-20 NOTE — Progress Notes (Signed)
Physical Therapy Treatment Patient Details Name: [name] MRN: [id number] DOB: [birth year] Today's Date: 10/20/2021   History of Present Illness [name] is a 88 y.o. female admitted 10/2  who presented to the ER with acute onset of chest pain with associated dyspnea and dizziness which have been worsening over the last 2 to 3 days.  She admits to dizziness when she stands up and feels off balance.  She has been having headaches without paresthesias or focal muscle weakness.  Left hemibody with numbness and weakness at times but MRI with no acute changes.  PMH:  dyslipidemia and GERD, TIA, DVT, diverticulosis and osteoarthritis    PT Comments    Pt tolerates mobility better this session, ambulating for increased distances. Pt continues to demonstrate instability when without UE support. Pt often reports she feels as if she is panting when mobilizing and intermittently at rest, despite stable vital signs. Pt reports focal weakness involving LLE, which is objectively weaker than RLE, 4/5 compared to 5/5. Pt will benefit from aggressive mobilization in order to improve strength and balance. PT reinforces performance of VOR x1 exercise in an effort to improve gaze stabilization and reduce risk for potential vestibular contribution to symptoms.   Recommendations for follow up therapy are one component of a multi-disciplinary discharge planning process, led by the attending physician.  Recommendations may be updated based on patient status, additional functional criteria and insurance authorization.  Follow Up Recommendations  Home health PT (vestibular rehab with transition to outpatient)     Assistance Recommended at Discharge Intermittent Supervision/Assistance  Patient can return home with the following A little help with walking and/or transfers;A little help with bathing/dressing/bathroom;Assistance with cooking/housework;Assist for transportation;Help with stairs or ramp for entrance    Equipment Recommendations  None recommended by PT (encourage use of RW when ambulating)    Recommendations for Other Services       Precautions / Restrictions Precautions Precautions: Fall Restrictions Weight Bearing Restrictions: No     Mobility  Bed Mobility Overal bed mobility: Needs Assistance Bed Mobility: Sit to Supine       Sit to supine: Supervision        Transfers Overall transfer level: Needs assistance Equipment used: Rolling walker (2 wheels) Transfers: Sit to/from Stand Sit to Stand: Min guard                Ambulation/Gait Ambulation/Gait assistance: Min guard Gait Distance (Feet): 250 Feet Assistive device: Rolling walker (2 wheels), None Gait Pattern/deviations: Step-through pattern, Drifts right/left Gait velocity: reduced Gait velocity interpretation: <1.8 ft/sec, indicate of risk for recurrent falls   General Gait Details: initial 56' with RW, remaining 150' without device. Pt with increased lateral drift and sway without UE support. often seeking support of railings or objects when fatigued   Stairs             Wheelchair Mobility    Modified Rankin (Stroke Patients Only)       Balance Overall balance assessment: Needs assistance Sitting-balance support: No upper extremity supported, Feet supported Sitting balance-Leahy Scale: Good     Standing balance support: Single extremity supported, Reliant on assistive device for balance Standing balance-Leahy Scale: Poor                              Cognition Arousal/Alertness: Awake/alert Behavior During Therapy: WFL for tasks assessed/performed Overall Cognitive Status: Within Functional Limits for tasks assessed  Exercises Other Exercises Other Exercises: Gaze stabilization exercises horizontal x3 reps, pt able to maintain for ~15-20 seconds. 2 reps vertical for 10-15 seconds. Encouraged pt to  continue to progress to 45 seconds per repetition, increasing speed as able while maintaining focus on object/letter    General Comments General comments (skin integrity, edema, etc.): VSS on RA, pt reports she feels as if she is panting when mobilizing however sats stable, RR elevated into upper 20s at times. Orthostatic vitals recorded immediately prior to session were negative.      Pertinent Vitals/Pain Pain Assessment Pain Assessment: No/denies pain    Home Living                          Prior Function            PT Goals (current goals can now be found in the care plan section) Acute Rehab PT Goals Patient Stated Goal: to go home Progress towards PT goals: Progressing toward goals    Frequency    Min 4X/week      PT Plan Current plan remains appropriate    Co-evaluation              AM-PAC PT "6 Clicks" Mobility   Outcome Measure  Help needed turning from your back to your side while in a flat bed without using bedrails?: None Help needed moving from lying on your back to sitting on the side of a flat bed without using bedrails?: None Help needed moving to and from a bed to a chair (including a wheelchair)?: A Little Help needed standing up from a chair using your arms (e.g., wheelchair or bedside chair)?: A Little Help needed to walk in hospital room?: A Little Help needed climbing 3-5 steps with a railing? : A Lot 6 Click Score: 19    End of Session   Activity Tolerance: Patient tolerated treatment well Patient left: in bed;with call bell/phone within reach;with bed alarm set;with family/visitor present Nurse Communication: Mobility status PT Visit Diagnosis: Unsteadiness on feet (R26.81)     Time: 8022-3361 PT Time Calculation (min) (ACUTE ONLY): 38 min  Charges:  $Gait Training: 23-37 mins $Therapeutic Exercise: 8-22 mins                     Zenaida Niece, PT, DPT Acute Rehabilitation Office [phone]    Zenaida Niece 10/20/2021, 2:42 PM

## 2021-10-20 NOTE — Progress Notes (Signed)
Initial Nutrition Assessment  DOCUMENTATION CODES:   Not applicable  INTERVENTION:  Continue current diet as ordered Encourage adequate PO intake Ensure Enlive po BID, each supplement provides 350 kcal and 20 grams of protein. MVI with minerals daily  NUTRITION DIAGNOSIS:   Increased nutrient needs related to acute illness as evidenced by estimated needs.  GOAL:   Patient will meet greater than or equal to 90% of their needs  MONITOR:   PO intake, Supplement acceptance, Labs, Weight trends  REASON FOR ASSESSMENT:   Consult Assessment of nutrition requirement/status  ASSESSMENT:   Pt admitted with chest pain and worsening dyspnea and dizziness. PMH significant for dyslipidemia, GERD, TIA, DVT, diverticulosis and osteoarthritis.  Cardiology following. Negative troponin. Negative for PE. Checking orthostatics d/t vertigo/near syncope.   Pt reports having a fair appetite with no significant changes in her intake recently. She recalls eating 2 meals per day with snacks at home. She and her husband eat meals together. He has diabetes so she reports typically following a diabetic friendly diet. They eat lots of vegetables and chicken or [place] but not much pork or beef. They occasionally eat white bread or rice but have switched to whole wheat bread and brown rice.  She states that she has never weighed more than 140 lbs her whole life but since covid had started losing weight and is down to 120 lbs. D/t her weight loss her MD recommended she start drinking Boost. She typically has 1 per day. She used to walk about 2 miles per day everyday but since 2020 has stopped. She now tries to walk up and down her driveway daily.   Reviewed weight history. Her documented weight is -2.5 kg since 05/24/21. This is a 4.3% weight loss which is not clinically significant for time frame.   Pt does not currently meet criteria for malnutrition however she is at nutrition high risk. She would benefit  from the addition of nutrition supplements to optimize her nutritional intake in addition to maintaining consistency with her home intake throughout admission.    Medications: acidophilus, vitamin D3, MVI, Vitamin B12  Labs: BUN 40, Cr 1.49, GFR 33  NUTRITION - FOCUSED PHYSICAL EXAM:  Flowsheet Row Most Recent Value  Orbital Region No depletion  Upper Arm Region Mild depletion  Thoracic and Lumbar Region No depletion  Buccal Region No depletion  Temple Region Mild depletion  Clavicle Bone Region Mild depletion  Clavicle and Acromion Bone Region Mild depletion  Scapular Bone Region Mild depletion  Dorsal Hand Moderate depletion  Patellar Region Moderate depletion  Anterior Thigh Region Moderate depletion  Posterior Calf Region Moderate depletion  Edema (RD Assessment) None  Hair Reviewed  Eyes Reviewed  Mouth Reviewed  Skin Reviewed  Nails Reviewed       Diet Order:   Diet Order             Diet regular Room service appropriate? Yes; Fluid consistency: Thin  Diet effective now                   EDUCATION NEEDS:   Education needs have been addressed  Skin:  Skin Assessment: Reviewed RN Assessment  Last BM:  10/3  Height:   Ht Readings from Last 1 Encounters:  10/18/21 '5\' 3"'$  (1.6 m)    Weight:   Wt Readings from Last 1 Encounters:  10/20/21 55.1 kg   BMI:  Body mass index is 21.51 kg/m.  Estimated Nutritional Needs:   Kcal:  1400-1600  Protein:  70-85g  Fluid:  >/=1.5L  [name], RDN, LDN Clinical Nutrition

## 2021-10-20 NOTE — Progress Notes (Signed)
  Progress Note Patient: [name] WUJ:[id number] DOB: Jul 25, 1927 DOA: 10/18/2021  DOS: the patient was seen and examined on 10/20/2021  Brief hospital course: PMH of DVT, TIA, CKD 3A, HTN presented to hospital with complaints of chest pain, dizziness and shortness of breath.  Work-up so far is unremarkable including a negative CT chest, unremarkable EKG as well as negative troponins.  Cardiology was consulted for further work-up. Assessment and Plan: Chest pain. Suspect this is noncardiac in nature. Troponins are negative. EKG unremarkable. Nuclear stress test in 2018 was low risk. Cardiology was consulted.  Recommend no further work-up.  History of DVT. CT PE protocol was performed which was negative for any PE. No further work-up.  Dysphagia. Patient reports food stuck in her food pipe. She also reports unintentional weight loss. We will check esophagogram for further work-up.  Acute kidney injury on chronic kidney disease stage IIIb. Baseline serum creatinine around 1.1.  On 10/4 serum creatinine around 1.49. Patient was treated with IV diuresis likely that is responsible for AKI. We will hold off on further diuresis and monitor renal function.  Concern for acute on chronic diastolic CHF. Treated with IV Lasix. Currently holding off on further diuresis. Monitor.  Near syncopal events. Work-up including telemetry monitoring, orthostatic vitals are negative. No focal deficit at the time of my evaluation. Cardiology recommends no further work-up for syncope.  Anxiety. Patient appears to be severely anxious. Currently on Xanax as needed. Recommend to consider initiation of Remeron with follow-up with PCP in 5 days if no improvement in symptoms on discharge.  Subjective: Multiple complaints.  Continues to have fatigue and tiredness.  Continues to have chest pain.  Continues to have shortness of breath.  Continues to have dizziness and lightheadedness.  Reports food being  stuck in her throat.  Unable to sleep.  Reports weight loss.  Reports multiple stressors in life.  Physical Exam: Vitals:   10/20/21 1333 10/20/21 1554 10/20/21 1808 10/20/21 1945  BP:  131/69 (!) 111/99 (!) 149/71  Pulse:  81 77 72  Resp: (!) '24 20 18 17  '$ Temp:  98 F (36.7 C)  98 F (36.7 C)  TempSrc:  Oral  Oral  SpO2:  96% 99% 95%  Weight:      Height:       General: Appear in marked distress; no visible Abnormal Neck Mass Or lumps, Conjunctiva normal Cardiovascular: S1 and S2 Present, no Murmur, Respiratory: good respiratory effort, Bilateral Air entry present and bilateral Crackles, no wheezes Abdomen: Bowel Sound present, Non tender  Extremities: no Pedal edema Neurology: alert and oriented to time, place, and person  Gait not checked due to patient safety concerns   Data Reviewed: I have Reviewed nursing notes, Vitals, and Lab results since pt's last encounter. Pertinent lab results CBC and BMP I have ordered test including CBC BMP and troponin I have ordered imaging studies x-ray esophagus. I have independently visualized and interpreted EKG which showed EKG: normal sinus rhythm, nonspecific ST and T waves changes.   Family Communication: Daughter at bedside  Disposition: Status is: Observation  Author: Berle Mull, MD 10/20/2021 8:29 PM  Please look on [email or web address] to find out who is on call.

## 2021-10-20 NOTE — Progress Notes (Signed)
Notified by CT that patient's RT AC IV site was infiltrated with IVC. Notified Sheran Luz MD to come assess patient. Patient denies pain and arm is soft to touch. Arm is elevated above the heart with ice pack. Will continue to monitor

## 2021-10-20 NOTE — Progress Notes (Signed)
TR[facility] Night coverage note  Pt seen and evaluated at bedside, R arm IV infiltrated during CT causing extravasation of IVC dye.  They were still able to complete the CTA using IV in L arm however.  On evaluation:  Does have swelling to R upper arm and biceps area.  No erythema, hives, pt denies any sensation of throat swelling.  No severe pain to arm on exam or palpation.  Compartments are soft at this time.  Spoke with CT tech as well as pharmacy: no specific protocol or recommendation for additional benadryl + steroids after IVC dye infiltration to prevent allergic reaction following extravasation of IVC dye in a patient with h/o allergy to IVC dye.  A: At least at this time, pt doesn't seem to be having acute allergic reaction.  P: Monitor closely, elevate, ice packs.  Call if patient develops any worsening swelling, erythema, hives, or throat swelling sensation or anything else that could suggest the onset of allergic reaction.

## 2021-10-21 ENCOUNTER — Observation Stay (HOSPITAL_COMMUNITY): Payer: Medicare PPO

## 2021-10-21 ENCOUNTER — Inpatient Hospital Stay (HOSPITAL_COMMUNITY): Payer: Medicare PPO

## 2021-10-21 DIAGNOSIS — Z8049 Family history of malignant neoplasm of other genital organs: Secondary | ICD-10-CM | POA: Diagnosis not present

## 2021-10-21 DIAGNOSIS — Z1152 Encounter for screening for COVID-19: Secondary | ICD-10-CM | POA: Diagnosis not present

## 2021-10-21 DIAGNOSIS — Z8673 Personal history of transient ischemic attack (TIA), and cerebral infarction without residual deficits: Secondary | ICD-10-CM | POA: Diagnosis not present

## 2021-10-21 DIAGNOSIS — Z8249 Family history of ischemic heart disease and other diseases of the circulatory system: Secondary | ICD-10-CM | POA: Diagnosis not present

## 2021-10-21 DIAGNOSIS — N1832 Chronic kidney disease, stage 3b: Secondary | ICD-10-CM | POA: Diagnosis present

## 2021-10-21 DIAGNOSIS — R072 Precordial pain: Secondary | ICD-10-CM | POA: Diagnosis not present

## 2021-10-21 DIAGNOSIS — Z803 Family history of malignant neoplasm of breast: Secondary | ICD-10-CM | POA: Diagnosis not present

## 2021-10-21 DIAGNOSIS — Z86718 Personal history of other venous thrombosis and embolism: Secondary | ICD-10-CM | POA: Diagnosis not present

## 2021-10-21 DIAGNOSIS — F419 Anxiety disorder, unspecified: Secondary | ICD-10-CM | POA: Diagnosis present

## 2021-10-21 DIAGNOSIS — R079 Chest pain, unspecified: Secondary | ICD-10-CM | POA: Diagnosis present

## 2021-10-21 DIAGNOSIS — Y92239 Unspecified place in hospital as the place of occurrence of the external cause: Secondary | ICD-10-CM | POA: Diagnosis present

## 2021-10-21 DIAGNOSIS — R7989 Other specified abnormal findings of blood chemistry: Secondary | ICD-10-CM | POA: Diagnosis present

## 2021-10-21 DIAGNOSIS — K224 Dyskinesia of esophagus: Secondary | ICD-10-CM | POA: Diagnosis present

## 2021-10-21 DIAGNOSIS — I129 Hypertensive chronic kidney disease with stage 1 through stage 4 chronic kidney disease, or unspecified chronic kidney disease: Secondary | ICD-10-CM | POA: Diagnosis present

## 2021-10-21 DIAGNOSIS — E7800 Pure hypercholesterolemia, unspecified: Secondary | ICD-10-CM | POA: Diagnosis present

## 2021-10-21 DIAGNOSIS — I5033 Acute on chronic diastolic (congestive) heart failure: Secondary | ICD-10-CM | POA: Diagnosis present

## 2021-10-21 DIAGNOSIS — I10 Essential (primary) hypertension: Secondary | ICD-10-CM | POA: Diagnosis not present

## 2021-10-21 DIAGNOSIS — R42 Dizziness and giddiness: Secondary | ICD-10-CM | POA: Diagnosis present

## 2021-10-21 DIAGNOSIS — Z888 Allergy status to other drugs, medicaments and biological substances status: Secondary | ICD-10-CM | POA: Diagnosis not present

## 2021-10-21 DIAGNOSIS — I4510 Unspecified right bundle-branch block: Secondary | ICD-10-CM | POA: Diagnosis present

## 2021-10-21 DIAGNOSIS — K219 Gastro-esophageal reflux disease without esophagitis: Secondary | ICD-10-CM | POA: Diagnosis present

## 2021-10-21 DIAGNOSIS — N179 Acute kidney failure, unspecified: Secondary | ICD-10-CM | POA: Diagnosis present

## 2021-10-21 DIAGNOSIS — I700 Atherosclerosis of aorta: Secondary | ICD-10-CM | POA: Diagnosis present

## 2021-10-21 DIAGNOSIS — R2681 Unsteadiness on feet: Secondary | ICD-10-CM | POA: Diagnosis present

## 2021-10-21 DIAGNOSIS — Z6821 Body mass index (BMI) 21.0-21.9, adult: Secondary | ICD-10-CM | POA: Diagnosis not present

## 2021-10-21 DIAGNOSIS — Z90710 Acquired absence of both cervix and uterus: Secondary | ICD-10-CM | POA: Diagnosis not present

## 2021-10-21 DIAGNOSIS — R634 Abnormal weight loss: Secondary | ICD-10-CM | POA: Diagnosis present

## 2021-10-21 DIAGNOSIS — E785 Hyperlipidemia, unspecified: Secondary | ICD-10-CM | POA: Diagnosis not present

## 2021-10-21 DIAGNOSIS — Z8619 Personal history of other infectious and parasitic diseases: Secondary | ICD-10-CM | POA: Diagnosis not present

## 2021-10-21 LAB — BASIC METABOLIC PANEL
Anion gap: 11 (ref 5–15)
BUN: 57 mg/dL — ABNORMAL HIGH (ref 8–23)
CO2: 24 mmol/L (ref 22–32)
Calcium: 9.3 mg/dL (ref 8.9–10.3)
Chloride: 97 mmol/L — ABNORMAL LOW (ref 98–111)
Creatinine, Ser: 1.73 mg/dL — ABNORMAL HIGH (ref 0.44–1.00)
GFR, Estimated: 27 mL/min — ABNORMAL LOW (ref >60)
Glucose, Bld: 132 mg/dL — ABNORMAL HIGH (ref 70–99)
Potassium: 4.1 mmol/L (ref 3.5–5.1)
Sodium: 132 mmol/L — ABNORMAL LOW (ref 135–145)

## 2021-10-21 LAB — CBC WITH DIFFERENTIAL/PLATELET
Abs Immature Granulocytes: 0.08 10*3/uL — ABNORMAL HIGH (ref 0.00–0.07)
Basophils Absolute: 0 10*3/uL (ref 0.0–0.1)
Basophils Relative: 0 %
Eosinophils Absolute: 0 10*3/uL (ref 0.0–0.5)
Eosinophils Relative: 0 %
HCT: 33.7 % — ABNORMAL LOW (ref 36.0–46.0)
Hemoglobin: 12.4 g/dL (ref 12.0–15.0)
Immature Granulocytes: 0 %
Lymphocytes Relative: 19 %
Lymphs Abs: 3.6 10*3/uL (ref 0.7–4.0)
MCH: 30.3 pg (ref 26.0–34.0)
MCHC: 36.8 g/dL — ABNORMAL HIGH (ref 30.0–36.0)
MCV: 82.4 fL (ref 80.0–100.0)
Monocytes Absolute: 1.5 10*3/uL — ABNORMAL HIGH (ref 0.1–1.0)
Monocytes Relative: 8 %
Neutro Abs: 13.5 10*3/uL — ABNORMAL HIGH (ref 1.7–7.7)
Neutrophils Relative %: 73 %
Platelets: 170 10*3/uL (ref 150–400)
RBC: 4.09 MIL/uL (ref 3.87–5.11)
RDW: 13.4 % (ref 11.5–15.5)
WBC: 18.7 10*3/uL — ABNORMAL HIGH (ref 4.0–10.5)
nRBC: 0 % (ref 0.0–0.2)

## 2021-10-21 LAB — TROPONIN I (HIGH SENSITIVITY): Troponin I (High Sensitivity): 20 ng/L — ABNORMAL HIGH (ref <18)

## 2021-10-21 MED ORDER — SODIUM CHLORIDE 0.9 % IV SOLN: INTRAVENOUS | Status: DC

## 2021-10-21 MED ORDER — PANTOPRAZOLE SODIUM 40 MG PO TBEC
40.0000 mg | DELAYED_RELEASE_TABLET | Freq: Two times a day (BID) | ORAL | Status: DC
  Administered 2021-10-21 – 2021-10-22 (×3): 40 mg via ORAL
  Filled 2021-10-21 (×3): qty 1

## 2021-10-21 MED ORDER — HEPARIN SODIUM (PORCINE) 5000 UNIT/ML IJ SOLN
5000.0000 [IU] | Freq: Three times a day (TID) | INTRAMUSCULAR | Status: DC
  Administered 2021-10-21 – 2021-10-22 (×4): 5000 [IU] via SUBCUTANEOUS
  Filled 2021-10-21 (×4): qty 1

## 2021-10-21 MED ORDER — ENSURE ENLIVE PO LIQD
237.0000 mL | Freq: Three times a day (TID) | ORAL | Status: DC
  Administered 2021-10-21: 237 mL via ORAL

## 2021-10-21 MED ORDER — PROSOURCE PLUS PO LIQD
30.0000 mL | Freq: Two times a day (BID) | ORAL | Status: DC
  Administered 2021-10-21 – 2021-10-22 (×2): 30 mL via ORAL
  Filled 2021-10-21 (×2): qty 30

## 2021-10-21 MED ORDER — SODIUM CHLORIDE 0.9 % IV SOLN: INTRAVENOUS | Status: AC

## 2021-10-21 NOTE — Plan of Care (Signed)
  Problem: Cardiac: Goal: Ability to achieve and maintain adequate cardiovascular perfusion will improve Outcome: Progressing   Problem: Coping: Goal: Level of anxiety will decrease Outcome: Progressing   Problem: Pain Managment: Goal: General experience of comfort will improve Outcome: Progressing   Problem: Safety: Goal: Ability to remain free from injury will improve Outcome: Progressing

## 2021-10-21 NOTE — Evaluation (Signed)
Clinical/Bedside Swallow Evaluation Patient Details  Name: RAYNEISHA BOUZA MRN: [id number] Date of Birth: [birth year]  Today's Date: 10/21/2021 Time: SLP Start Time (ACUTE ONLY): 1321 SLP Stop Time (ACUTE ONLY): 1336 SLP Time Calculation (min) (ACUTE ONLY): 15 min  Past Medical History:  Past Medical History:  Diagnosis Date   Abnormal Pap smear 1975-76   Acute deep vein thrombosis (DVT) of left peroneal vein (HCC)    Anemia    history of   Arthritis    spine   Breast cyst, left 1980   Cystocele 2012   Diverticulosis    with h/o Diverticulitis   DVT, lower extremity, distal, chronic ([facility]) 12/24/2018   GERD (gastroesophageal reflux disease)    H/O hypercholesterolemia    H/O osteopenia    H/O varicella    H/O varicose veins    Hx of Mumps    Macular degeneration    Seasonal allergies    TIA (transient ischemic attack)    Urge incontinence 2012   Urinary frequency 2010   Past Surgical History:  Past Surgical History:  Procedure Laterality Date   ABDOMINAL HYSTERECTOMY     BLADDER SUSPENSION N/A 06/16/2016   Procedure: TRANSVAGINAL TAPE (TVT) PROCEDURE;  Surgeon: Everett Graff, MD;  Location: [facility];  Service: Gynecology;  Laterality: N/A;   BREAST CYST ASPIRATION  1964   COLONOSCOPY     Dr. Jodene Nam REPAIR N/A 06/16/2016   Procedure: ANTERIOR REPAIR (CYSTOCELE);  Surgeon: Everett Graff, MD;  Location: [facility];  Service: Gynecology;  Laterality: N/A;   CYSTOSCOPY N/A 06/16/2016   Procedure: CYSTOSCOPY;  Surgeon: Everett Graff, MD;  Location: [facility];  Service: Gynecology;  Laterality: N/A;  see anterior repair   Lower Extremity Venous Dopplers  01/09/2012   Right and left lower steroids: No evidence of thrombus or, thrombophlebitis; right and left GSV and SSV: No venous insufficiency. Normal exam.   NM MYOVIEW LTD  11/2016   6.6 METS.  Reached 106% of max.  Heart rate.  Walk for 4:40 min.  EF 72%.  Normal blood pressure response.  upsloping ST segment depression,  nonspecific.  Otherwise normal study.  LOW RISK.  No evidence of ischemia or infarction.   TRANSTHORACIC ECHOCARDIOGRAM  01/2018   EF > 65%. LV size small. Gr 1 DD (normal for age).   Normal atrial size. Moderate Aortic Sclerosis without Stenosis. Moderate Mitral Annular Calcification with mild MR & no MS   HPI:  CHARLISA CHAM is a 88 y.o. female admitted 10/2  who presented to the ER with acute onset of chest pain with associated dyspnea and dizziness worsening over the last 2 to 3 days. MRI with no acute changes. Pt. reports globus sensation in chest with eating. Barium esophagram 10/5/20223 noted severe esophageal dysmotility, pulsion diverticulum of lower esophagus just above GE junction and inability to swallow pill beyond pharynx. PMH:  dyslipidemia and GERD, TIA, DVT, diverticulosis and osteoarthritis    Assessment / Plan / Recommendation  Clinical Impression  Pt seen for clinical swallow assessment with findings of barium esophgram significant for severe dysmotility, pulsion diverticulum of lower esophagus just above GE junction and inability to swallow pill beyond pharynx. She notes problem has been present "for some time" but worse past several days. Pt consumed thin, puree and solid texture with functional mastication and brisk swallow response. There was one weak and delayed cough noted after initial sip of thin but isolated to that one event. Education provided to mitigate symptoms of esophageal  dysphagia with recommendation of regular texture, thin liquids and pills with liquid (if difficulty, pt can cut pill in half and/or consume with puree). Pt presently on full liquid diet- upgrade to regular per MD when warranted. No further ST needed at this time. SLP Visit Diagnosis: Dysphagia, unspecified (R13.10)    Aspiration Risk  Mild aspiration risk    Diet Recommendation Regular;Thin liquid   Liquid Administration via: Cup;Spoon Medication Administration: Whole meds with liquid (break in  half or with applesauce if needed) Supervision: Patient able to self feed Compensations: Slow rate;Small sips/bites Postural Changes: Seated upright at 90 degrees;Remain upright for at least 30 minutes after po intake    Other  Recommendations Recommended Consults: Consider GI evaluation (educated pt consider GI eval if symptoms do not resolve) Oral Care Recommendations: Oral care BID    Recommendations for follow up therapy are one component of a multi-disciplinary discharge planning process, led by the attending physician.  Recommendations may be updated based on patient status, additional functional criteria and insurance authorization.  Follow up Recommendations No SLP follow up      Assistance Recommended at Discharge None  Functional Status Assessment    Frequency and Duration            Prognosis        Swallow Study   General HPI: FATIMATA TALSMA is a 88 y.o. female admitted 10/2  who presented to the ER with acute onset of chest pain with associated dyspnea and dizziness worsening over the last 2 to 3 days. MRI with no acute changes. Pt. reports globus sensation in chest with eating. Barium esophagram 10/5/20223 noted severe esophageal dysmotility, pulsion diverticulum of lower esophagus just above GE junction and inability to swallow pill beyond pharynx. PMH:  dyslipidemia and GERD, TIA, DVT, diverticulosis and osteoarthritis Type of Study: Bedside Swallow Evaluation Previous Swallow Assessment:  (none) Diet Prior to this Study: Thin liquids;Other (Comment) (full liquids) Temperature Spikes Noted: No Respiratory Status: Room air History of Recent Intubation: No Behavior/Cognition: Alert;Cooperative;Pleasant mood Oral Cavity Assessment: Within Functional Limits Oral Care Completed by SLP: No Oral Cavity - Dentition: Adequate natural dentition Vision: Functional for self-feeding Self-Feeding Abilities: Able to feed self Patient Positioning: Upright in chair Baseline  Vocal Quality: Normal Volitional Cough: Strong Volitional Swallow: Able to elicit    Oral/Motor/Sensory Function Overall Oral Motor/Sensory Function: Within functional limits   Ice Chips Ice chips: Not tested   Thin Liquid Thin Liquid: Within functional limits Presentation: Straw    Nectar Thick Nectar Thick Liquid: Not tested   Honey Thick Honey Thick Liquid: Not tested   Puree Puree: Within functional limits   Solid     Solid: Within functional limits      Carlesha Seiple, Orbie Pyo 10/21/2021,2:18 PM

## 2021-10-21 NOTE — Progress Notes (Signed)
Pt refusing IV at this time. MD notified. Okay to leave IV out.

## 2021-10-21 NOTE — Progress Notes (Signed)
Physical Therapy Treatment Patient Details Name: [name] MRN: [id number] DOB: Apr 09, 1927 Today's Date: 10/21/2021   History of Present Illness [name] is a 88 y.o. female admitted 10/2  who presented to the ER with acute onset of chest pain with associated dyspnea and dizziness which have been worsening over the last 2 to 3 days.  She admits to dizziness when she stands up and feels off balance.  She has been having headaches without paresthesias or focal muscle weakness.  Left hemibody with numbness and weakness at times but MRI with no acute changes.  PMH:  dyslipidemia and GERD, TIA, DVT, diverticulosis and osteoarthritis    PT Comments    Pt tolerates treatment well, ambulating for increased distances. Pt denies feeling SOB or panting, also reports no dizziness during session, only mild instability. Pt will benefit from continued aggressive mobilization in an effort to improve balance and to restore her prior level of function. PT recommends HHPT at the time of discharge in an effort to progress vestibular rehab and exercise.   Recommendations for follow up therapy are one component of a multi-disciplinary discharge planning process, led by the attending physician.  Recommendations may be updated based on patient status, additional functional criteria and insurance authorization.  Follow Up Recommendations  Home health PT (with transition to outpatient vestibular)     Assistance Recommended at Discharge Intermittent Supervision/Assistance  Patient can return home with the following A little help with walking and/or transfers;A little help with bathing/dressing/bathroom;Assistance with cooking/housework;Assist for transportation;Help with stairs or ramp for entrance   Equipment Recommendations  None recommended by PT    Recommendations for Other Services       Precautions / Restrictions Precautions Precautions: Fall Restrictions Weight Bearing Restrictions: No      Mobility  Bed Mobility                    Transfers Overall transfer level: Needs assistance Equipment used: None Transfers: Sit to/from Stand Sit to Stand: Supervision                Ambulation/Gait Ambulation/Gait assistance: Supervision Gait Distance (Feet): 350 Feet Assistive device: None Gait Pattern/deviations: Step-through pattern, Drifts right/left Gait velocity: reduced Gait velocity interpretation: 1.31 - 2.62 ft/sec, indicative of limited community ambulator   General Gait Details: 2 instances of increased lateral drift but no overt loss of balance   Stairs Stairs:  (pt declines stair training)           Wheelchair Mobility    Modified Rankin (Stroke Patients Only)       Balance Overall balance assessment: Needs assistance Sitting-balance support: No upper extremity supported, Feet supported Sitting balance-Leahy Scale: Good     Standing balance support: No upper extremity supported, During functional activity Standing balance-Leahy Scale: Good                              Cognition Arousal/Alertness: Awake/alert Behavior During Therapy: WFL for tasks assessed/performed Overall Cognitive Status: Within Functional Limits for tasks assessed                                          Exercises Other Exercises Other Exercises: PT encourages continued performance of VOR x1 exercise, pt performed recently prior to PT arrival. PT provides education on progression to VOR x2 once  pt is able to tolerate VOR x1 for increased time and speed    General Comments General comments (skin integrity, edema, etc.): VSS on RA, pt denies "panting" feeling she had yesterday      Pertinent Vitals/Pain Pain Assessment Pain Assessment: No/denies pain    Home Living                          Prior Function            PT Goals (current goals can now be found in the care plan section) Acute Rehab PT  Goals Patient Stated Goal: to go home Progress towards PT goals: Progressing toward goals    Frequency    Min 4X/week      PT Plan Current plan remains appropriate    Co-evaluation              AM-PAC PT "6 Clicks" Mobility   Outcome Measure    Help needed moving from lying on your back to sitting on the side of a flat bed without using bedrails?: None Help needed moving to and from a bed to a chair (including a wheelchair)?: A Little Help needed standing up from a chair using your arms (e.g., wheelchair or bedside chair)?: A Little Help needed to walk in hospital room?: A Little Help needed climbing 3-5 steps with a railing? : A Little 6 Click Score: 16    End of Session   Activity Tolerance: Patient tolerated treatment well Patient left: in chair;with call bell/phone within reach Nurse Communication: Mobility status PT Visit Diagnosis: Unsteadiness on feet (R26.81)     Time: 1610-9604 PT Time Calculation (min) (ACUTE ONLY): 13 min  Charges:  $Gait Training: 8-22 mins                     Zenaida Niece, PT, DPT Acute Rehabilitation Office [phone]    Zenaida Niece 10/21/2021, 12:17 PM

## 2021-10-21 NOTE — Progress Notes (Signed)
  Progress Note Patient: [name] QZR:[id number] DOB: 1927/03/09 DOA: 10/18/2021  DOS: the patient was seen and examined on 10/21/2021  Brief hospital course: PMH of DVT, TIA, CKD 3A, HTN presented to hospital with complaints of chest pain, dizziness and shortness of breath.  Work-up so far is unremarkable including a negative CT chest, unremarkable EKG as well as negative troponins.  Cardiology was consulted for further work-up. Assessment and Plan: Chest pain. Suspect this is noncardiac in nature. Troponins are flat, not suggestive of ACS. EKG unremarkable. Nuclear stress test in 2018 was low risk. Cardiology was consulted.  Recommend no further work-up.  History of DVT. CT PE protocol was performed which was negative for any PE. No further work-up.  Dysphagia. Patient reports food stuck in her food pipe. She also reports unintentional weight loss. Esophagogram shows severe dysmotility.  Speech therapy consulted currently recommending regular diet.  Acute kidney injury on chronic kidney disease stage IIIb. Baseline serum creatinine around 1.1.  Now trending up to 1.7. Patient was treated with IV diuresis likely that is responsible for AKI. Patient was given IV fluid.  We will recheck labs tomorrow.  Concern for acute on chronic diastolic CHF. Treated with IV Lasix. Currently holding off on further diuresis. Monitor.  Near syncopal events. Work-up including telemetry monitoring, orthostatic vitals are negative. No focal deficit at the time of my evaluation. Cardiology recommends no further work-up for syncope.  Anxiety. Patient appears to be severely anxious. Currently on Xanax as needed. Recommend to consider initiation of Remeron with follow-up with PCP in 5 days if no improvement in symptoms on discharge.  Subjective: Feeling better.  No nausea no vomiting.  No chest pain at the time of my evaluation.  Physical Exam: Vitals:   10/21/21 0544 10/21/21 0721 10/21/21  1101 10/21/21 1714  BP: 124/70  (!) 149/85 122/68  Pulse: 75  (!) 57 61  Resp: '15  20 15  '$ Temp: 97.8 F (36.6 C)  (!) 97.4 F (36.3 C) 98 F (36.7 C)  TempSrc: Oral  Oral Oral  SpO2: 95% 94% (!) 79% 95%  Weight: 56 kg     Height:       General: Appear in mild distress; no visible Abnormal Neck Mass Or lumps, Conjunctiva normal Cardiovascular: S1 and S2 Present, aortic systolic  Murmur, Respiratory: good respiratory effort, Bilateral Air entry present and CTA, no Crackles, no wheezes Abdomen: Bowel Sound present, Non tender Extremities: no Pedal edema Neurology: alert and oriented to Self, Place and time.   Data Reviewed: I have Reviewed nursing notes, Vitals, and Lab results since pt's last encounter. Pertinent lab results CBC and BMP I have ordered test including CBC and BMP   Discussed with cardiology.  Family Communication: No one at bedside.  Left a voicemail for daughter.  Disposition: Has AKI.  Requiring further work-up and therapy.  Author: Berle Mull, MD 10/21/2021 8:14 PM  Please look on [email or web address] to find out who is on call.

## 2021-10-22 DIAGNOSIS — R072 Precordial pain: Secondary | ICD-10-CM | POA: Diagnosis not present

## 2021-10-22 LAB — CBC WITH DIFFERENTIAL/PLATELET
Abs Immature Granulocytes: 0.02 10*3/uL (ref 0.00–0.07)
Basophils Absolute: 0 10*3/uL (ref 0.0–0.1)
Basophils Relative: 0 %
Eosinophils Absolute: 0.1 10*3/uL (ref 0.0–0.5)
Eosinophils Relative: 2 %
HCT: 33.2 % — ABNORMAL LOW (ref 36.0–46.0)
Hemoglobin: 12.4 g/dL (ref 12.0–15.0)
Immature Granulocytes: 0 %
Lymphocytes Relative: 46 %
Lymphs Abs: 4.4 10*3/uL — ABNORMAL HIGH (ref 0.7–4.0)
MCH: 30.6 pg (ref 26.0–34.0)
MCHC: 37.3 g/dL — ABNORMAL HIGH (ref 30.0–36.0)
MCV: 82 fL (ref 80.0–100.0)
Monocytes Absolute: 0.7 10*3/uL (ref 0.1–1.0)
Monocytes Relative: 8 %
Neutro Abs: 4.2 10*3/uL (ref 1.7–7.7)
Neutrophils Relative %: 44 %
Platelets: 172 10*3/uL (ref 150–400)
RBC: 4.05 MIL/uL (ref 3.87–5.11)
RDW: 13.1 % (ref 11.5–15.5)
WBC: 9.5 10*3/uL (ref 4.0–10.5)
nRBC: 0 % (ref 0.0–0.2)

## 2021-10-22 LAB — BASIC METABOLIC PANEL
Anion gap: 9 (ref 5–15)
BUN: 42 mg/dL — ABNORMAL HIGH (ref 8–23)
CO2: 24 mmol/L (ref 22–32)
Calcium: 9 mg/dL (ref 8.9–10.3)
Chloride: 101 mmol/L (ref 98–111)
Creatinine, Ser: 1.26 mg/dL — ABNORMAL HIGH (ref 0.44–1.00)
GFR, Estimated: 40 mL/min — ABNORMAL LOW (ref >60)
Glucose, Bld: 115 mg/dL — ABNORMAL HIGH (ref 70–99)
Potassium: 4 mmol/L (ref 3.5–5.1)
Sodium: 134 mmol/L — ABNORMAL LOW (ref 135–145)

## 2021-10-22 MED ORDER — PREMIER PROTEIN SHAKE: 2.0000 [oz_av] | Freq: Three times a day (TID) | ORAL | 0 refills | Status: AC

## 2021-10-22 MED ORDER — GUAIFENESIN ER 600 MG PO TB12: 600.0000 mg | ORAL_TABLET | Freq: Two times a day (BID) | ORAL | 0 refills | Status: DC

## 2021-10-22 MED ORDER — MECLIZINE HCL 12.5 MG PO TABS: 12.5000 mg | ORAL_TABLET | Freq: Three times a day (TID) | ORAL | 0 refills | Status: DC | PRN

## 2021-10-22 MED ORDER — PANTOPRAZOLE SODIUM 40 MG PO TBEC: 40.0000 mg | DELAYED_RELEASE_TABLET | Freq: Two times a day (BID) | ORAL | 0 refills | Status: DC

## 2021-10-22 MED ORDER — PROSOURCE PLUS PO LIQD: 30.0000 mL | Freq: Two times a day (BID) | ORAL | 0 refills | Status: AC

## 2021-10-22 NOTE — Progress Notes (Signed)
   10/22/21 1003  Mobility  Activity Ambulated with assistance in hallway  Activity Response Tolerated well  Distance Ambulated (ft) 300 ft  $Mobility charge 1 Mobility  Level of Assistance Standby assist, set-up cues, supervision of patient - no hands on  Assistive Device Front wheel walker  Mobility Referral Yes   Mobility Specialist Progress Note  Received pt in bed having no complaints and agreeable to mobility.Had some c/o chest pain. Left in bed w/ call bell in reach and all needs met.   Lucious Groves Mobility Specialist

## 2021-10-22 NOTE — Consult Note (Signed)
   [facility] CM Inpatient Consult   10/22/2021  NORABELLE KONDO [birth year] [id number]  [place]  Accountable Care Organization [ACO] Patient: [name] PPO  Primary Care Provider:  listed as Lavone Orn, MD, this is an [facility] and has a dedicated care coordination team   Patient screened for hospitalization needs for post hospital transition.    Patient has a Care Management team with [facility].  Will sign off.   For questions contact:   Natividad Brood, RN BSN [place]  [phone] business mobile phone Toll free office [phone]  *[place]  [phone] Fax number: [phone] [email or web address]

## 2021-10-23 DIAGNOSIS — K224 Dyskinesia of esophagus: Secondary | ICD-10-CM | POA: Diagnosis present

## 2021-10-23 NOTE — Discharge Summary (Signed)
Physician Discharge Summary   Patient: [name] MRN: [id number] DOB: [birth year]  Admit date:     10/18/2021  Discharge date: 10/22/2021  Discharge Physician: Berle Mull  PCP: Charlane Ferretti, MD  Recommendations at discharge: Follow-up with PCP in 1 week. Follow-up with cardiologist as needed.   Follow-up Information     [facility] Follow up.   Specialty: [place] Why: Agency will call you to set up apt times Contact information: [place] [address] [phone]         Lavone Orn, MD. Schedule an appointment as soon as possible for a visit in 1 week(s).   Specialty: Internal Medicine Why: consider anxiety medicine if needed like remeron Contact information: [address] [place] [address] [phone]         Lavone Orn, MD Follow up.   Specialty: Internal Medicine Why: The office will call patient. Contact information: [address] [place] [address] [phone]         Leonie Man, MD .   Specialty: Cardiology Contact information: [address] [place] [address] [phone]                Discharge Diagnoses: Principal Problem:   Chest pain Active Problems:   AKI (acute kidney injury) ([place])   Vertigo   Essential hypertension   Dyslipidemia   History of TIA (transient ischemic attack)   Abnormal weight loss   Esophageal dysmotility  Hospital Course: PMH of DVT, TIA, CKD 3A, HTN presented to hospital with complaints of chest pain, dizziness and shortness of breath.  Work-up so far is unremarkable including a negative CT chest, unremarkable EKG as well as negative troponins.  Cardiology was consulted for further work-up. Assessment and Plan  Chest pain. Suspect this is noncardiac in nature. Troponins are flat, not suggestive of ACS. EKG unremarkable. Nuclear stress test in 2018 was low risk. Cardiology  was consulted.  Recommend no further work-up.   History of DVT. CT PE protocol was performed which was negative for any PE. No further work-up.   Dysphagia. Esophageal dysmotility Patient reports food stuck in her food pipe. She also reports unintentional weight loss. Esophagogram shows severe dysmotility.   Speech therapy consulted currently recommending regular diet. Follow-up with GI as needed although no significant therapy option for this condition.   Acute kidney injury on chronic kidney disease stage IIIb. Baseline serum creatinine around 1.1.  Now trending up to 1.7. Patient was treated with IV diuresis likely that is responsible for AKI. Patient was given IV fluid.  With improvement in renal function.   Concern for acute on chronic diastolic CHF. Treated with IV Lasix. Currently holding off on further diuresis. Monitor.   Near syncopal events. Work-up including telemetry monitoring, orthostatic vitals are negative. No focal deficit at the time of my evaluation. Cardiology recommends no further work-up for syncope. Meclizine as needed. Vestibular evaluation also negative.   Anxiety. Patient appears to be severely anxious. Currently on Xanax as needed. Recommend to consider initiation of Remeron with follow-up with PCP in 5 days if no improvement in symptoms on discharge.   Consultants:  Cardiology   Procedures performed:  Echocardiogram   DISCHARGE MEDICATION: Allergies as of 10/22/2021       Reactions   Atorvastatin Other (See Comments)   Unknown reaction   Desmopressin Acetate Other (See Comments)   Iodinated Contrast Media Hives, Other (See  Comments)   Myrbetriq [mirabegron] Other (See Comments)   "Makes me urinate more frequently"   Iodine Rash        Medication List     TAKE these medications    (feeding supplement) PROSource Plus liquid Take 30 mLs by mouth 2 (two) times daily between meals.   acetaminophen 500 MG tablet Commonly known  as: TYLENOL Take 500-1,000 mg by mouth every 8 (eight) hours as needed for mild pain.   albuterol 108 (90 Base) MCG/ACT inhaler Commonly known as: VENTOLIN HFA Inhale 2 puffs into the lungs every 4 (four) hours as needed for wheezing or shortness of breath.   aspirin EC 81 MG tablet Take 1 tablet (81 mg total) by mouth daily. Swallow whole.   Centrum Silver Ultra Womens Tabs Take 1 tablet by mouth daily with breakfast.   AIRBORNE PO Take 1 tablet by mouth daily with breakfast.   ICAPS AREDS 2 PO Take 1 capsule by mouth daily at 12 noon.   Culturelle Caps Take 1 capsule by mouth every morning.   ezetimibe 10 MG tablet Commonly known as: ZETIA Take 10 mg by mouth daily.   guaiFENesin 600 MG 12 hr tablet Commonly known as: MUCINEX Take 1 tablet (600 mg total) by mouth 2 (two) times daily.   Magnesium 250 MG Tabs Take 250 mg by mouth daily as needed (leg cramps).   meclizine 12.5 MG tablet Commonly known as: ANTIVERT Take 1 tablet (12.5 mg total) by mouth 3 (three) times daily as needed for dizziness.   metoprolol succinate 25 MG 24 hr tablet Commonly known as: TOPROL-XL Take 25 mg by mouth daily. [place]sp[place] sprinkle   MYRBETRIQ PO Take 1 tablet by mouth at bedtime.   pantoprazole 40 MG tablet Commonly known as: PROTONIX Take 1 tablet (40 mg total) by mouth 2 (two) times daily before a meal.   pravastatin 20 MG tablet Commonly known as: PRAVACHOL Take 2 tablets (40 mg total) by mouth daily. What changed: additional instructions Notes to patient: Take tonight @ bedtime 10/22/21   protein supplement shake Liqd Commonly known as: PREMIER PROTEIN Take 59.1 mLs (2 oz total) by mouth 3 (three) times daily between meals.   Trospium Chloride 60 MG Cp24 Take 1 capsule by mouth daily.   vitamin B-12 100 MCG tablet Commonly known as: CYANOCOBALAMIN Take 100 mcg by mouth daily. Notes to patient: Take tomorrow morning 10/7   Vitamin D-3 25 MCG (1000 UT) Caps Take 1,000  Units by mouth daily with breakfast.       Disposition: Home Diet recommendation: Cardiac diet  Discharge Exam: Vitals:   10/22/21 0552 10/22/21 0745 10/22/21 0900 10/22/21 1324  BP: 131/68 (!) 140/63  (!) 119/58  Pulse: 60     Resp: 18  18   Temp: 97.8 F (36.6 C)  (!) 97.5 F (36.4 C)   TempSrc: Oral  Oral   SpO2: 97%  99%   Weight: 55.3 kg     Height:       General: Appear in mild distress; no visible Abnormal Neck Mass Or lumps, Conjunctiva normal Cardiovascular: S1 and S2 Present, aortic systolic  Murmur, Respiratory: good respiratory effort, Bilateral Air entry present and CTA, no Crackles, no wheezes Abdomen: Bowel Sound present, Non tender  Extremities: no Pedal edema Neurology: alert and oriented to time, place, and person  Filed Weights   10/20/21 0442 10/21/21 0544 10/22/21 0552  Weight: 55.1 kg 56 kg 55.3 kg   Condition at discharge: stable  The results of significant diagnostics from this hospitalization (including imaging, microbiology, ancillary and laboratory) are listed below for reference.   Imaging Studies: US RENAL  Result Date: 10/21/2021 CLINICAL DATA:  Acute kidney injury EXAM: RENAL / URINARY TRACT ULTRASOUND COMPLETE COMPARISON:  02/24/2014 FINDINGS: Right Kidney: Renal measurements: 8.6 x 4.4 x 6.2 cm = volume: 123 mL. Mild renal cortical thinning with increased parenchymal echogenicity. No mass, shadowing stone, or hydronephrosis visualized. Left Kidney: Renal measurements: 9.3 x 3.9 x 4.5 cm = volume: 85 mL. Mild renal cortical thinning with increased parenchymal echogenicity. No mass, shadowing stone, or hydronephrosis visualized. Bladder: Appears normal for degree of bladder distention. Other: None. IMPRESSION: 1. No evidence of obstructive uropathy. 2. Findings of chronic medical renal disease. Electronically Signed   By: Davina Poke D.O.   On: 10/21/2021 09:42   DG ESOPHAGUS W SINGLE CM (SOL OR THIN BA)  Result Date: 10/21/2021 CLINICAL  DATA:  Patient reports pain in her chest when she swallows food EXAM: ESOPHAGUS/BARIUM SWALLOW/TABLET STUDY TECHNIQUE: Single contrast examination was performed using thin liquid barium. This exam was performed by Reatha Armour, PA-C , and was supervised and interpreted by Dr Sabino Dick. FLUOROSCOPY: Radiation Exposure Index (as provided by the fluoroscopic device): 7.00 mGy Kerma COMPARISON:  None Available. FINDINGS: Swallowing: Appears normal. No vestibular penetration or aspiration seen. Pharynx: Unremarkable. Esophagus: Pulsion diverticulum noted in lower esophagus just above the gastroesophageal junction. Esophageal motility: Severe esophageal dysmotility visualized. Hiatal Hernia: None. Gastroesophageal reflux: None visualized. Ingested 75m barium tablet: Was not able to pass beyond the pharynx. Patient coughed up pill after 3 or 4 minutes Other: None. IMPRESSION: 1.  Severe esophageal dysmotility. 2. Pulsion diverticulum of lower esophagus just above gastroesophageal junction. 3.  Inability to swallow pill beyond the pharynx. Electronically Signed   By: JMarijo ConceptionM.D.   On: 10/21/2021 09:04   CT Angio Chest Pulmonary Embolism (PE) W or WO Contrast  Result Date: 10/20/2021 CLINICAL DATA:  Chest wall pain EXAM: CT ANGIOGRAPHY CHEST WITH CONTRAST TECHNIQUE: Multidetector CT imaging of the chest was performed using the standard protocol during bolus administration of intravenous contrast. Multiplanar CT image reconstructions and MIPs were obtained to evaluate the vascular anatomy. RADIATION DOSE REDUCTION: This exam was performed according to the departmental dose-optimization program which includes automated exposure control, adjustment of the mA and/or kV according to patient size and/or use of iterative reconstruction technique. CONTRAST:  50 mL OMNIPAQUE IOHEXOL 350 MG/ML SOLN COMPARISON:  Chest radiograph dated 10/18/2021. CTA chest dated 02/06/2019. FINDINGS: Cardiovascular: Satisfactory  opacification the bilateral pulmonary arteries to the lobar level. No evidence of pulmonary embolism. Although not tailored for evaluation of the thoracic aorta, there is no evidence thoracic aortic aneurysm or dissection. The heart is normal in size.  No pericardial effusion. Mild coronary atherosclerosis of the LAD. Mediastinum/Nodes: No suspicious mediastinal lymphadenopathy. Visualized thyroid is unremarkable. Lungs/Pleura: Mild dependent atelectasis in the bilateral upper and lower lobes. Additional compressive atelectasis in the medial left lower lobe and patchy right lower lobe atelectasis. No focal consolidation. No suspicious pulmonary nodules. No pleural effusion or pneumothorax. Upper Abdomen: Visualized upper abdomen is grossly unremarkable. Musculoskeletal: Mild degenerative changes of the thoracic spine. No rib fracture is seen. Review of the MIP images confirms the above findings. IMPRESSION: No evidence of pulmonary embolism. No rib fracture is seen. No evidence of acute cardiopulmonary disease. Electronically Signed   By: SJulian HyM.D.   On: 10/20/2021 03:50   MR BRAIN WO CONTRAST  Result Date: 10/19/2021 CLINICAL DATA:  Dizziness EXAM: MRI HEAD WITHOUT CONTRAST TECHNIQUE: Multiplanar, multiecho pulse sequences of the brain and surrounding structures were obtained without intravenous contrast. COMPARISON:  06/01/2021 FINDINGS: Brain: Left cerebellar and right occipital chronic microhemorrhage. No acute infarct or acute hemorrhage. There is multifocal hyperintense T2-weighted signal within the white matter. Generalized volume loss. The midline structures are normal. Vascular: Major flow voids are preserved. Skull and upper cervical spine: Normal calvarium and skull base. Visualized upper cervical spine and soft tissues are normal. Sinuses/Orbits:No paranasal sinus fluid levels or advanced mucosal thickening. No mastoid or middle ear effusion. Normal orbits. IMPRESSION: 1. No acute  intracranial abnormality. 2. Findings of chronic small vessel ischemia and volume loss. Electronically Signed   By: Ulyses Jarred M.D.   On: 10/19/2021 00:30   DG Chest 2 View  Result Date: 10/18/2021 CLINICAL DATA:  chest pain, SOB EXAM: CHEST - 2 VIEW COMPARISON:  Chest x-ray 06/18/2021 FINDINGS: The heart and mediastinal contours are unchanged. Aortic calcification. Biapical pleural/pulmonary scarring. Bilateral costophrenic angle scarring. No focal consolidation. No pulmonary edema. No pleural effusion. No pneumothorax. No acute osseous abnormality. IMPRESSION: 1. No active cardiopulmonary disease. 2.  Aortic Atherosclerosis (ICD10-I70.0). Electronically Signed   By: Iven Finn M.D.   On: 10/18/2021 21:10    Microbiology: Results for orders placed or performed during the hospital encounter of 10/18/21  Resp Panel by RT-PCR (Flu A&B, Covid) Anterior Nasal Swab     Status: None   Collection Time: 10/18/21  8:05 PM   Specimen: Anterior Nasal Swab  Result Value Ref Range Status   SARS Coronavirus 2 by RT PCR NEGATIVE NEGATIVE Final    Comment: (NOTE) SARS-CoV-2 target nucleic acids are NOT DETECTED.  The SARS-CoV-2 RNA is generally detectable in upper respiratory specimens during the acute phase of infection. The lowest concentration of SARS-CoV-2 viral copies this assay can detect is 138 copies/mL. A negative result does not preclude SARS-Cov-2 infection and should not be used as the sole basis for treatment or other patient management decisions. A negative result may occur with  improper specimen collection/handling, submission of specimen other than nasopharyngeal swab, presence of viral mutation(s) within the areas targeted by this assay, and inadequate number of viral copies(<138 copies/mL). A negative result must be combined with clinical observations, patient history, and epidemiological information. The expected result is Negative.  Fact Sheet for Patients:   EntrepreneurPulse.com.au  Fact Sheet for Healthcare Providers:  IncredibleEmployment.be  This test is no t yet approved or cleared by the [place] FDA and  has been authorized for detection and/or diagnosis of SARS-CoV-2 by FDA under an Emergency Use Authorization (EUA). This EUA will remain  in effect (meaning this test can be used) for the duration of the COVID-19 declaration under Section 564(b)(1) of the Act, 21 U.S.C.section 360bbb-3(b)(1), unless the authorization is terminated  or revoked sooner.       Influenza A by PCR NEGATIVE NEGATIVE Final   Influenza B by PCR NEGATIVE NEGATIVE Final    Comment: (NOTE) The Xpert Xpress SARS-CoV-2/FLU/RSV plus assay is intended as an aid in the diagnosis of influenza from Nasopharyngeal swab specimens and should not be used as a sole basis for treatment. Nasal washings and aspirates are unacceptable for Xpert Xpress SARS-CoV-2/FLU/RSV testing.  Fact Sheet for Patients: EntrepreneurPulse.com.au  Fact Sheet for Healthcare Providers: IncredibleEmployment.be  This test is not yet approved or cleared by the [place] FDA and has been authorized for detection and/or diagnosis of SARS-CoV-2 by  FDA under an Emergency Use Authorization (EUA). This EUA will remain in effect (meaning this test can be used) for the duration of the COVID-19 declaration under Section 564(b)(1) of the Act, 21 U.S.C. section 360bbb-3(b)(1), unless the authorization is terminated or revoked.  Performed at [facility], [place] [address], [place], [place] [address]    Labs: CBC: Recent Labs  Lab 10/18/21 1951 10/20/21 0101 10/21/21 0049 10/22/21 0046  WBC 7.8 9.2 18.7* 9.5  NEUTROABS  --  7.7 13.5* 4.2  HGB 11.7* 13.9 12.4 12.4  HCT 31.3* 38.6 33.7* 33.2*  MCV 84.4 83.5 82.4 82.0  PLT 150 183 170 163   Basic Metabolic Panel: Recent Labs  Lab 10/18/21 1951  10/20/21 0101 10/21/21 0049 10/22/21 0046  NA 136 137 132* 134*  K 4.3 4.2 4.1 4.0  CL 104 98 97* 101  CO2 '24 27 24 24  '$ GLUCOSE 94 170* 132* 115*  BUN 31* 40* 57* 42*  CREATININE 1.20* 1.49* 1.73* 1.26*  CALCIUM 9.5 10.1 9.3 9.0   Liver Function Tests: Recent Labs  Lab 10/18/21 1951  AST 17  ALT 13  ALKPHOS 40  BILITOT 0.7  PROT 6.7  ALBUMIN 3.8   CBG: No results for input(s): "GLUCAP" in the last 168 hours.  Discharge time spent: greater than 30 minutes.  Signed: Berle Mull, MD [facility] 10/22/2021

## 2021-10-29 DIAGNOSIS — R200 Anesthesia of skin: Secondary | ICD-10-CM | POA: Diagnosis not present

## 2021-10-29 DIAGNOSIS — R1319 Other dysphagia: Secondary | ICD-10-CM | POA: Diagnosis not present

## 2021-10-29 DIAGNOSIS — R0602 Shortness of breath: Secondary | ICD-10-CM | POA: Diagnosis not present

## 2021-10-29 DIAGNOSIS — R2689 Other abnormalities of gait and mobility: Secondary | ICD-10-CM | POA: Diagnosis not present

## 2021-10-29 DIAGNOSIS — N179 Acute kidney failure, unspecified: Secondary | ICD-10-CM | POA: Diagnosis not present

## 2021-10-29 DIAGNOSIS — Z23 Encounter for immunization: Secondary | ICD-10-CM | POA: Diagnosis not present

## 2021-11-01 DIAGNOSIS — M47816 Spondylosis without myelopathy or radiculopathy, lumbar region: Secondary | ICD-10-CM | POA: Diagnosis not present

## 2021-11-08 ENCOUNTER — Encounter (INDEPENDENT_AMBULATORY_CARE_PROVIDER_SITE_OTHER): Payer: Medicare PPO | Admitting: Ophthalmology

## 2021-11-08 DIAGNOSIS — H35033 Hypertensive retinopathy, bilateral: Secondary | ICD-10-CM | POA: Diagnosis not present

## 2021-11-08 DIAGNOSIS — I10 Essential (primary) hypertension: Secondary | ICD-10-CM

## 2021-11-08 DIAGNOSIS — H353231 Exudative age-related macular degeneration, bilateral, with active choroidal neovascularization: Secondary | ICD-10-CM

## 2021-11-08 DIAGNOSIS — H43813 Vitreous degeneration, bilateral: Secondary | ICD-10-CM | POA: Diagnosis not present

## 2021-11-17 HISTORY — PX: CT CTA CORONARY W/CA SCORE W/CM &/OR WO/CM: HXRAD787

## 2021-11-17 HISTORY — PX: TRANSTHORACIC ECHOCARDIOGRAM: SHX275

## 2021-11-22 ENCOUNTER — Encounter: Payer: Self-pay | Admitting: Cardiology

## 2021-11-22 ENCOUNTER — Ambulatory Visit: Payer: Medicare PPO | Attending: Cardiology | Admitting: Cardiology

## 2021-11-22 VITALS — BP 139/79 | HR 63 | Ht 63.0 in | Wt 123.6 lb

## 2021-11-22 DIAGNOSIS — R2689 Other abnormalities of gait and mobility: Secondary | ICD-10-CM

## 2021-11-22 DIAGNOSIS — I10 Essential (primary) hypertension: Secondary | ICD-10-CM

## 2021-11-22 DIAGNOSIS — R42 Dizziness and giddiness: Secondary | ICD-10-CM

## 2021-11-22 DIAGNOSIS — R002 Palpitations: Secondary | ICD-10-CM

## 2021-11-22 DIAGNOSIS — R634 Abnormal weight loss: Secondary | ICD-10-CM

## 2021-11-22 DIAGNOSIS — R0609 Other forms of dyspnea: Secondary | ICD-10-CM | POA: Diagnosis not present

## 2021-11-22 DIAGNOSIS — E785 Hyperlipidemia, unspecified: Secondary | ICD-10-CM | POA: Diagnosis not present

## 2021-11-22 DIAGNOSIS — K224 Dyskinesia of esophagus: Secondary | ICD-10-CM | POA: Diagnosis not present

## 2021-11-22 DIAGNOSIS — N1831 Chronic kidney disease, stage 3a: Secondary | ICD-10-CM

## 2021-11-22 DIAGNOSIS — R072 Precordial pain: Secondary | ICD-10-CM

## 2021-11-22 MED ORDER — PREDNISONE 50 MG PO TABS: ORAL_TABLET | ORAL | 0 refills | Status: DC

## 2021-11-22 NOTE — Patient Instructions (Addendum)
Medication Instructions:   See below instructions  *If you need a refill on your cardiac medications before your next appointment, please call your pharmacy*   Lab Work: See below instructions   If you have labs (blood work) drawn today and your tests are completely normal, you will receive your results only by: [place] (if you have MyChart) OR A paper copy in the mail If you have any lab test that is abnormal or we need to change your treatment, we will call you to review the results.   Testing/Procedures: Will be schedule at [facility] has requested that you have an echocardiogram. Echocardiography is a painless test that uses sound waves to create images of your heart. It provides your doctor with information about the size and shape of your heart and how well your heart's chambers and valves are working. This procedure takes approximately one hour. There are no restrictions for this procedure. Please do NOT wear cologne, perfume, aftershave, or lotions (deodorant is allowed). Please arrive 15 minutes prior to your appointment time.   Will be placed on the same day you have your echo done at [place] has recommended that you wear a holter monitor 14 day . Holter monitors are medical devices that record the heart's electrical activity. Doctors most often use these monitors to diagnose arrhythmias. Arrhythmias are problems with the speed or rhythm of the heartbeat. The monitor is a small, portable device. You can wear one while you do your normal daily activities. This is usually used to diagnose what is causing palpitations/syncope (passing out).    Will be schedule at [address] _ go to the [address] entrance [place] has requested that you have coronary  CT angiography . Coronary  computed tomography (CT) angiography  is a painless test that uses an x-ray machine to  take clear, detailed pictures of your heart arteries . Please follow instruction sheet as given.   Follow-Up: At [facility], you and your health needs are our priority.  As part of our continuing mission to provide you with exceptional heart care, we have created designated Provider Care Teams.  These Care Teams include your primary Cardiologist (physician) and Advanced Practice Providers (APPs -  Physician Assistants and Nurse Practitioners) who all work together to provide you with the care you need, when you need it.  We recommend signing up for the patient portal called "MyChart".  Sign up information is provided on this After Visit Summary.  MyChart is used to connect with patients for Virtual Visits (Telemedicine).  Patients are able to view lab/test results, encounter notes, upcoming appointments, etc.  Non-urgent messages can be sent to your provider as well.   To learn more about what you can do with MyChart, go to [email or web address].    Your next appointment:   2 month(s)  The format for your next appointment:   In Person  Provider:   Glenetta Hew, MD    Other Instructions    Your cardiac CT will be scheduled at the below location:   [facility] [address] [place], [place] [address] [phone]    Please arrive at the [facility] and Children's Entrance (Entrance C2) of [facility] 30 minutes prior to test start time. You can use the FREE valet parking offered at entrance C (encouraged to control the heart rate for the test)  Proceed to the  [facility] Radiology Department (first floor) to check-in and test prep.  All radiology patients and guests should use entrance C2 at [facility], accessed from [facility], even though the hospital's physical address listed is [address].       Please follow these instructions carefully (unless otherwise directed):   BMP  - at least 7 days prior to the test    On the Night Before the Test: Be sure to Drink plenty of water. Do not consume any caffeinated/decaffeinated beverages or chocolate 12 hours prior to your test. Do not take any antihistamines 12 hours prior to your test. If the patient has contrast allergy: Patient will need a prescription for Prednisone and very clear instructions (as follows): Prednisone 50 mg - take 13 hours prior to test Take another Prednisone 50 mg 7 hours prior to test Take another Prednisone 50 mg 1 hour prior to test Take Benadryl 50 mg 1 hour prior to test Patient must complete all four doses of above prophylactic medications. Patient will need a ride after test due to Benadryl.  On the Day of the Test: Drink plenty of water until 1 hour prior to the test. Do not eat any food 1 hour prior to test. You may take your regular medications prior to the test.  Take metoprolol (Lopressor) 50 mg ( of your regular dose)  two hours prior to test. FEMALES- please wear underwire-free bra if available, avoid dresses & tight clothing       After the Test: Drink plenty of water. After receiving IV contrast, you may experience a mild flushed feeling. This is normal. On occasion, you may experience a mild rash up to 24 hours after the test. This is not dangerous. If this occurs, you can take Benadryl 25 mg and increase your fluid intake. If you experience trouble breathing, this can be serious. If it is severe call 911 IMMEDIATELY. If it is mild, please call our office.   We will call to schedule your test 2-4 weeks out understanding that some insurance companies will need an authorization prior to the service being performed.   For non-scheduling related questions, please contact the cardiac imaging nurse navigator should you have any questions/concerns: Marchia Bond, Cardiac Imaging Nurse Navigator Gordy Clement, Cardiac Imaging Nurse Navigator [facility] Direct Office Dial: [phone]    For scheduling needs, including cancellations and rescheduling, please call [place], [phone].

## 2021-11-22 NOTE — Progress Notes (Unsigned)
Primary Care Provider: Charlane Ferretti, MD [place] Cardiologist: Glenetta Hew, MD Electrophysiologist: None  Clinic Note: Chief Complaint  Patient presents with   Follow-up   Shortness of Breath    ===================================  ASSESSMENT/PLAN   Problem List Items Addressed This Visit       Cardiology Problems   Dyslipidemia, goal LDL below 100 - Primary (Chronic)     Other   DOE (dyspnea on exertion)   Relevant Orders   EKG 12-Lead (Completed)   ECHOCARDIOGRAM COMPLETE   LONG TERM MONITOR (3-14 DAYS)   CT CORONARY MORPH W/CTA COR W/SCORE W/CA W/CM &/OR WO/CM   Basic metabolic panel   Palpitations (Chronic)   Relevant Orders   EKG 12-Lead (Completed)   ECHOCARDIOGRAM COMPLETE   LONG TERM MONITOR (3-14 DAYS)   CT CORONARY MORPH W/CTA COR W/SCORE W/CA W/CM &/OR WO/CM   Basic metabolic panel   Poor balance   Dizziness, nonspecific   Relevant Orders   EKG 12-Lead (Completed)   ECHOCARDIOGRAM COMPLETE   LONG TERM MONITOR (3-14 DAYS)   CT CORONARY MORPH W/CTA COR W/SCORE W/CA W/CM &/OR WO/CM   Basic metabolic panel   Unintentional weight loss   ===================================  HPI:    [name] is a 88 y.o. female with a PMH notable for HTN, HLD and palpitations as well as history of lower extremity DVT who presents today for delayed 68-month and hospital follow-up.  [name] was last seen on March 19, 2021-doing fairly well.  Somewhat concerned about losing weight having lost another 4 pounds.  She is not eating like she used to.  Never really got over having COVID.  Still having some exertional dyspnea having to stop a couple times try to complete 2 miles which she did not not use him to.  Only walked about half mile.  But no chest pain or pressure.  Wearing support socks usually but not for the exam.  Just noted trivial edema.  Does not always take pravastatin.  Sometimes gets muscle aches and headaches.  Started noticing balance  issues.  Recent Hospitalizations:    Reviewed  CV studies:    The following studies were reviewed today: (if available, images/films reviewed: From Epic Chart or Care Everywhere) 06/01/2021:-[place], ER evaluation for left leg heaviness and numbness.  Apparently there is also changing gait and speech.  Had complete stroke work-up that was negative.  An MRI of the spine was recommended but she wanted to talk with neurology. 07/20/2021: ER visit she suffered a fall-, tripped over stool in her kitchen and fell backwards.  Hit her head but no LOC noted.  No headache or blurred vision.  A little bit of dizziness after the fall but not persistent. 10/2-10/06/2021 admitted with acute onset of chest pain and associated dyspnea and dizziness worse over the last 2 to 3 days.  Also noting exertional dyspnea.  Dizziness with poor balance.  Troponin negative.  BNP was minimally elevated.  Brain MRI showed chronic small vessel disease but no acute abnormalities. => Because of a mild BNP elevation, she was gently diuresed. Chest pain felt to be noncardiac in nature.  Troponins are negative.  Cardiology consult recommend no further evaluation.  Negative PE.  Dysphagia not done aphasia noted with esophageal dysmotility. => Speech therapy consulted.  Regular diet.  Interval History:   [name] presents today just very upset very concerned and anxious.  She has been having symptoms that are very worrisome to  her of exertional dyspnea and intermittent chest pains and chest tightness.  Some headaches.  She has been having palpitations and fluttering.  She is having to breathe heavy and hard to catch her breath.  With any exertion she feels short of breath and as though she is constantly squeezing her chest.  She feels short little bursts of fast heart rates that come and go and tend to take her breath away.  She gets lightheaded and dizzy with these spells.  Very hard to get her to actually answer simple questions  because she keeps abolishing.  She does note episodes of chest pain and pressure both at rest and with exertion.  She has profound exertional dyspnea but seems to be working harder to breathe than 1 would expect for simply moving it around or walking in the clinic building.  When she gets short of breath with the chest discomfort she also will then get lightheaded and dizzy and occasionally have headaches.  She has very poor balance and dizziness.  She is not sure if this has to do with palpitations or not.  It does not appear that she is actually truly had syncope although she says she may have passed out 1 time that she is not sure of.  I do not think she is sleeping on extra pillows to suggest orthopnea or PND.  She does still have some mild swelling but this is no change from baseline.  REVIEWED OF SYSTEMS   Review of Systems  Constitutional:  Positive for malaise/fatigue. Negative for weight loss.  HENT:  Negative for congestion and sinus pain.   Respiratory:  Positive for shortness of breath. Negative for cough.   Cardiovascular:  Positive for chest pain.  Gastrointestinal:  Negative for blood in stool, constipation and melena.  Genitourinary:  Negative for frequency and hematuria.  Musculoskeletal:  Positive for falls and joint pain.  Neurological:  Positive for dizziness and weakness.  Psychiatric/Behavioral:  Positive for memory loss. Negative for depression. The patient is nervous/anxious. The patient does not have insomnia.    I have reviewed and (if needed) personally updated the patient's problem list, medications, allergies, past medical and surgical history, social and family history.   PAST MEDICAL HISTORY   Past Medical History:  Diagnosis Date   Abnormal Pap smear 1975-76   Acute deep vein thrombosis (DVT) of left peroneal vein (HCC)    Anemia    history of   Arthritis    spine   Breast cyst, left 1980   Cystocele 2012   Diverticulosis    with h/o Diverticulitis    DVT, lower extremity, distal, chronic ([place]) 12/24/2018   GERD (gastroesophageal reflux disease)    H/O hypercholesterolemia    H/O osteopenia    H/O varicella    H/O varicose veins    Hx of Mumps    Macular degeneration    Seasonal allergies    TIA (transient ischemic attack)    Urge incontinence 2012   Urinary frequency 2010    PAST SURGICAL HISTORY   Past Surgical History:  Procedure Laterality Date   ABDOMINAL HYSTERECTOMY     BLADDER SUSPENSION N/A 06/16/2016   Procedure: TRANSVAGINAL TAPE (TVT) PROCEDURE;  Surgeon: Everett Graff, MD;  Location: [facility];  Service: Gynecology;  Laterality: N/A;   BREAST CYST ASPIRATION  1964   COLONOSCOPY     Dr. Jodene Nam REPAIR N/A 06/16/2016   Procedure: ANTERIOR REPAIR (CYSTOCELE);  Surgeon: Everett Graff, MD;  Location: [facility];  Service: Gynecology;  Laterality: N/A;   CYSTOSCOPY N/A 06/16/2016   Procedure: CYSTOSCOPY;  Surgeon: Everett Graff, MD;  Location: [facility];  Service: Gynecology;  Laterality: N/A;  see anterior repair   Lower Extremity Venous Dopplers  01/09/2012   Right and left lower steroids: No evidence of thrombus or, thrombophlebitis; right and left GSV and SSV: No venous insufficiency. Normal exam.   NM MYOVIEW LTD  11/2016   6.6 METS.  Reached 106% of max.  Heart rate.  Walk for 4:40 min.  EF 72%.  Normal blood pressure response.  upsloping ST segment depression, nonspecific.  Otherwise normal study.  LOW RISK.  No evidence of ischemia or infarction.   TRANSTHORACIC ECHOCARDIOGRAM  01/2018   EF > 65%. LV size small. Gr 1 DD (normal for age).   Normal atrial size. Moderate Aortic Sclerosis without Stenosis. Moderate Mitral Annular Calcification with mild MR & no MS    Immunization History  Administered Date(s) Administered   Influenza, High Dose Seasonal PF 10/30/2013   PFIZER(Purple Top)SARS-COV-2 Vaccination 04/26/2019, 05/20/2019   Pneumococcal Conjugate-13 10/30/2013   Tdap 09/25/2011     MEDICATIONS/ALLERGIES   Current Meds  Medication Sig   acetaminophen (TYLENOL) 500 MG tablet Take 500-1,000 mg by mouth every 8 (eight) hours as needed for mild pain.    albuterol (VENTOLIN HFA) 108 (90 Base) MCG/ACT inhaler Inhale 2 puffs into the lungs every 4 (four) hours as needed for wheezing or shortness of breath.    aspirin EC 81 MG EC tablet Take 1 tablet (81 mg total) by mouth daily. Swallow whole.   Cholecalciferol (VITAMIN D-3) 25 MCG (1000 UT) CAPS Take 1,000 Units by mouth daily with breakfast.    ezetimibe (ZETIA) 10 MG tablet Take 10 mg by mouth daily.   guaiFENesin (MUCINEX) 600 MG 12 hr tablet Take 1 tablet (600 mg total) by mouth 2 (two) times daily.   Lactobacillus Rhamnosus, GG, (CULTURELLE) CAPS Take 1 capsule by mouth every morning.    Magnesium 250 MG TABS Take 250 mg by mouth daily as needed (leg cramps).   metoprolol succinate (TOPROL-XL) 25 MG 24 hr tablet Take 25 mg by mouth daily. Kapspargo sprinkle   Mirabegron (MYRBETRIQ PO) Take 1 tablet by mouth at bedtime.   Multiple Vitamins-Minerals (AIRBORNE PO) Take 1 tablet by mouth daily with breakfast.   Multiple Vitamins-Minerals (CENTRUM SILVER ULTRA WOMENS) TABS Take 1 tablet by mouth daily with breakfast.    Multiple Vitamins-Minerals (ICAPS AREDS 2 PO) Take 1 capsule by mouth daily at 12 noon.   Nutritional Supplements (,FEEDING SUPPLEMENT, PROSOURCE PLUS) liquid Take 30 mLs by mouth 2 (two) times daily between meals.   pantoprazole (PROTONIX) 40 MG tablet Take 1 tablet (40 mg total) by mouth 2 (two) times daily before a meal.   pravastatin (PRAVACHOL) 20 MG tablet Take 2 tablets (40 mg total) by mouth daily. (Patient taking differently: Take 40 mg by mouth daily. Pt states she takes medication as needed)   protein supplement shake (PREMIER PROTEIN) LIQD Take 59.1 mLs (2 oz total) by mouth 3 (three) times daily between meals.   Trospium Chloride 60 MG CP24 Take 1 capsule by mouth daily.   vitamin B-12  (CYANOCOBALAMIN) 100 MCG tablet Take 100 mcg by mouth daily.   [DISCONTINUED] meclizine (ANTIVERT) 12.5 MG tablet Take 1 tablet (12.5 mg total) by mouth 3 (three) times daily as needed for dizziness.    Allergies  Allergen Reactions   Atorvastatin Other (See Comments)  Unknown reaction   Desmopressin Acetate Other (See Comments)   Iodinated Contrast Media Hives and Other (See Comments)   Myrbetriq [Mirabegron] Other (See Comments)    "Makes me urinate more frequently"   Iodine Rash    SOCIAL HISTORY/FAMILY HISTORY   Reviewed in Epic:  Pertinent findings:  Social History   Tobacco Use   Smoking status: Never   Smokeless tobacco: Never  Vaping Use   Vaping Use: Never used  Substance Use Topics   Alcohol use: No   Drug use: No   Social History   Social History Narrative   She is a married mother of 55, grandmother of 58, great grandmother of 60. She walks daily about 2 miles a day, does not smoke, does not drink.    Lives with huband    OBJCTIVE -PE, EKG, labs   Wt Readings from Last 3 Encounters:  11/22/21 123 lb 9.6 oz (56.1 kg)  10/22/21 122 lb (55.3 kg)  06/24/21 124 lb (56.2 kg)    Physical Exam: BP 139/79 (BP Location: Left Arm, Patient Position: Sitting)   Pulse 63   Ht '5\' 3"'$  (1.6 m)   Wt 123 lb 9.6 oz (56.1 kg)   SpO2 98%   BMI 21.89 kg/m  Physical Exam Vitals reviewed.  Constitutional:      General: She is not in acute distress.    Appearance: She is well-developed and normal weight. She is not ill-appearing (Emotionally upset) or toxic-appearing.  HENT:     Head: Normocephalic and atraumatic.  Neck:     Vascular: No carotid bruit or JVD.  Cardiovascular:     Rate and Rhythm: Normal rate and regular rhythm.     Pulses: Normal pulses.     Heart sounds: Normal heart sounds. No murmur heard.    No friction rub. No gallop.     Comments: Normal S1 with physiologically split S2 Pulmonary:     Effort: Pulmonary effort is normal. No respiratory  distress.     Breath sounds: Normal breath sounds. No decreased breath sounds, wheezing, rhonchi or rales.  Chest:     Chest wall: Tenderness present.  Musculoskeletal:        General: No swelling. Normal range of motion.     Cervical back: Normal range of motion and neck supple.  Skin:    General: Skin is warm and dry.  Neurological:     General: No focal deficit present.     Mental Status: She is alert and oriented to person, place, and time.     Gait: Gait normal.  Psychiatric:        Thought Content: Thought content normal.        Judgment: Judgment normal.     Comments: Very anxious, nervous.     Adult ECG Report  Rate: 63 ;  Rhythm: normal sinus rhythm and left atrial enlargement.  Incomplete right bundle branch block. ;  Normal axis, intervals and durations.  Narrative Interpretation: Stable  Recent Labs: Reviewed Lab Results  Component Value Date   CHOL 214 (H) 10/20/2021   HDL 81 10/20/2021   LDLCALC 129 (H) 10/20/2021   TRIG 20 10/20/2021   CHOLHDL 2.6 10/20/2021   Lab Results  Component Value Date   CREATININE 1.26 (H) 10/22/2021   BUN 42 (H) 10/22/2021   NA 134 (L) 10/22/2021   K 4.0 10/22/2021   CL 101 10/22/2021   CO2 24 10/22/2021      Latest Ref Rng & Units 10/22/2021  12:46 AM 10/21/2021   12:49 AM 10/20/2021    1:01 AM  CBC  WBC 4.0 - 10.5 K/uL 9.5  18.7  9.2   Hemoglobin 12.0 - 15.0 g/dL 12.4  12.4  13.9   Hematocrit 36.0 - 46.0 % 33.2  33.7  38.6   Platelets 150 - 400 K/uL 172  170  183     Lab Results  Component Value Date   HGBA1C 5.7 (H) 04/05/2021   Lab Results  Component Value Date   TSH 0.628 10/19/2021    ================================================== I spent a total of ***minutes with the patient spent in direct patient consultation.  Additional time spent with chart review  / charting (studies, outside notes, etc): *** min Total Time: *** min  Current medicines are reviewed at length with the patient today.  (+/-  concerns) ***  Notice: This dictation was prepared with Dragon dictation along with smart phrase technology. Any transcriptional errors that result from this process are unintentional and may not be corrected upon review.  Studies Ordered:   Orders Placed This Encounter  Procedures   CT CORONARY MORPH W/CTA COR W/SCORE W/CA W/CM &/OR WO/CM   Basic metabolic panel   LONG TERM MONITOR (3-14 DAYS)   EKG 12-Lead   ECHOCARDIOGRAM COMPLETE   Meds ordered this encounter  Medications   predniSONE (DELTASONE) 50 MG tablet    Sig: Prednisone 50 mg - take 13 hours prior to test.Take another Prednisone 50 mg 7 hours prior to test. Take another Prednisone 50 mg 1 hour prior to test. Take Benadryl 50 mg 1 hour prior to test    Dispense:  3 tablet    Refill:  0    Patient Instructions / Medication Changes & Studies & Tests Ordered   Patient Instructions  Medication Instructions:   See below instructions  *If you need a refill on your cardiac medications before your next appointment, please call your pharmacy*   Lab Work: See below instructions   If you have labs (blood work) drawn today and your tests are completely normal, you will receive your results only by: MyChart Message (if you have MyChart) OR A paper copy in the mail If you have any lab test that is abnormal or we need to change your treatment, we will call you to review the results.   Testing/Procedures: Will be schedule at [facility] has requested that you have an echocardiogram. Echocardiography is a painless test that uses sound waves to create images of your heart. It provides your doctor with information about the size and shape of your heart and how well your heart's chambers and valves are working. This procedure takes approximately one hour. There are no restrictions for this procedure. Please do NOT wear cologne, perfume, aftershave, or lotions (deodorant is allowed). Please  arrive 15 minutes prior to your appointment time.   Will be placed on the same day you have your echo done at [place] has recommended that you wear a holter monitor 14 day . Holter monitors are medical devices that record the heart's electrical activity. Doctors most often use these monitors to diagnose arrhythmias. Arrhythmias are problems with the speed or rhythm of the heartbeat. The monitor is a small, portable device. You can wear one while you do your normal daily activities. This is usually used to diagnose what is causing palpitations/syncope (passing out).    Will be schedule at [address] _ go to the [address] [employer] [address] entrance C Your physician has requested that you have coronary  CT angiography . Coronary  computed tomography (CT) angiography  is a painless test that uses an x-ray machine to take clear, detailed pictures of your heart arteries . Please follow instruction sheet as given.   Follow-Up: At [facility], you and your health needs are our priority.  As part of our continuing mission to provide you with exceptional heart care, we have created designated Provider Care Teams.  These Care Teams include your primary Cardiologist (physician) and Advanced Practice Providers (APPs -  Physician Assistants and Nurse Practitioners) who all work together to provide you with the care you need, when you need it.  We recommend signing up for the patient portal called "MyChart".  Sign up information is provided on this After Visit Summary.  MyChart is used to connect with patients for Virtual Visits (Telemedicine).  Patients are able to view lab/test results, encounter notes, upcoming appointments, etc.  Non-urgent messages can be sent to your provider as well.   To learn more about what you can do with MyChart, go to [email or web address].    Your next appointment:   2 month(s)  The format for your next appointment:   In  Person  Provider:   Glenetta Hew, MD    Other Instructions    Your cardiac CT will be scheduled at the below location:   [facility] [address] [place], [place] [address] [phone]    Please arrive at the [facility] and Children's Entrance (Entrance C2) of [facility] 30 minutes prior to test start time. You can use the FREE valet parking offered at entrance C (encouraged to control the heart rate for the test)  Proceed to the [facility] Radiology Department (first floor) to check-in and test prep.  All radiology patients and guests should use entrance C2 at [facility], accessed from [facility], even though the hospital's physical address listed is [address].       Please follow these instructions carefully (unless otherwise directed):   BMP  - at least 7 days prior to the test   On the Night Before the Test: Be sure to Drink plenty of water. Do not consume any caffeinated/decaffeinated beverages or chocolate 12 hours prior to your test. Do not take any antihistamines 12 hours prior to your test. If the patient has contrast allergy: Patient will need a prescription for Prednisone and very clear instructions (as follows): Prednisone 50 mg - take 13 hours prior to test Take another Prednisone 50 mg 7 hours prior to test Take another Prednisone 50 mg 1 hour prior to test Take Benadryl 50 mg 1 hour prior to test Patient must complete all four doses of above prophylactic medications. Patient will need a ride after test due to Benadryl.  On the Day of the Test: Drink plenty of water until 1 hour prior to the test. Do not eat any food 1 hour prior to test. You may take your regular medications prior to the test.  Take metoprolol (Lopressor) 50 mg ( of your regular dose)  two hours prior to test. FEMALES- please wear underwire-free bra if available, avoid dresses & tight clothing       After the Test: Drink  plenty of water. After receiving IV contrast, you may experience a mild flushed feeling. This is normal. On occasion, you may experience a mild rash up to  24 hours after the test. This is not dangerous. If this occurs, you can take Benadryl 25 mg and increase your fluid intake. If you experience trouble breathing, this can be serious. If it is severe call 911 IMMEDIATELY. If it is mild, please call our office.   We will call to schedule your test 2-4 weeks out understanding that some insurance companies will need an authorization prior to the service being performed.   For non-scheduling related questions, please contact the cardiac imaging nurse navigator should you have any questions/concerns: Marchia Bond, Cardiac Imaging Nurse Navigator Gordy Clement, Cardiac Imaging Nurse Navigator [facility] Direct Office Dial: [phone]   For scheduling needs, including cancellations and rescheduling, please call [place], [phone].      Leonie Man, MD, MS Glenetta Hew, M.D., M.S. Interventional Cardiologist  [facility]  Pager # [phone] Phone # [phone] [address] [place], [place] [address]   Thank you for choosing [facility]!!

## 2021-11-25 ENCOUNTER — Encounter: Payer: Self-pay | Admitting: Cardiology

## 2021-11-25 NOTE — Assessment & Plan Note (Signed)
Her blood pressure is borderline today but with his much anxiety and stress that she is dealing with right now I think it is reasonable to just continue to monitor.  She has been relatively stable on Toprol only.  Would not want to be more aggressive until we have more data.

## 2021-11-25 NOTE — Assessment & Plan Note (Signed)
Poor balance, dizziness she is having maybe 1 fall.  Recommend vestibular rehab.

## 2021-11-25 NOTE — Assessment & Plan Note (Addendum)
Weight seems to be stable, will continue to monitor.  She has been concerned about this although it does not seem like it is traumatic at this point.  This could be leading to some symptoms of failure to thrive however.

## 2021-11-25 NOTE — Assessment & Plan Note (Signed)
Pre-CT labs ordered.  Will help to have her orally hydrate prior to her CT scan.

## 2021-11-25 NOTE — Assessment & Plan Note (Signed)
Hard to tell what her symptoms truly are.  We need to just confirm if she is actually having an arrhythmia.  Plan: Zio patch monitor

## 2021-11-25 NOTE — Assessment & Plan Note (Addendum)
For dizziness and lightheadedness along with palpitations and went to make sure she is not having arrhythmias which could also be contributing to her chest pain and dyspnea.  Plan: Check 2D echo and Zio patch monitor.  I do think a lot of this could be vertiginous or vestibular in nature.-  Plan: Referral for vestibular rehab.

## 2021-11-25 NOTE — Assessment & Plan Note (Signed)
She has had a history of esophageal dysmotility and I will make sure that were not dealing with a GI issue but we had to exclude coronary/cardiac issues first.

## 2021-11-25 NOTE — Assessment & Plan Note (Signed)
Concerning symptoms with tightness in her chest that is now happening at rest and with exertion.  Based on the level of anxiety I think we need to just exclude CAD.  I think the lack of specificity and sensitivity of a Myoview would lead to simply more confusion.  I think the more definitive evaluation would be Coronary CTA. ->  Premedication with prednisone ordered.

## 2021-11-25 NOTE — Assessment & Plan Note (Signed)
Her most concerning complaint is exertional dyspnea which seems to be where a portion what it has been in the past and associate with chest tightness and pressure.  Plan:  Assess for ischemia with Coronary CTA which will require premedication for contrast hypersensitivity the past.  Per protocol she was given prednisone premedications. She has not had an echocardiogram in quite a long time so we will also check an echocardiogram to ensure stable cardiac function.

## 2021-11-25 NOTE — Assessment & Plan Note (Signed)
In the past we have been somewhat lenient.  She has been on pravastatin and Zetia but does not take it as frequently as she should.  Her last lipid panel from October showed an LDL of 129 indicating that is probably not doing much good.  Her labs look pretty well controlled.  Usual.

## 2021-11-29 DIAGNOSIS — M810 Age-related osteoporosis without current pathological fracture: Secondary | ICD-10-CM | POA: Diagnosis not present

## 2021-11-29 DIAGNOSIS — R0609 Other forms of dyspnea: Secondary | ICD-10-CM | POA: Diagnosis not present

## 2021-11-29 DIAGNOSIS — Z0000 Encounter for general adult medical examination without abnormal findings: Secondary | ICD-10-CM | POA: Diagnosis not present

## 2021-11-29 DIAGNOSIS — Z8673 Personal history of transient ischemic attack (TIA), and cerebral infarction without residual deficits: Secondary | ICD-10-CM | POA: Diagnosis not present

## 2021-11-29 DIAGNOSIS — R1319 Other dysphagia: Secondary | ICD-10-CM | POA: Diagnosis not present

## 2021-11-29 DIAGNOSIS — N1831 Chronic kidney disease, stage 3a: Secondary | ICD-10-CM | POA: Diagnosis not present

## 2021-11-29 DIAGNOSIS — K580 Irritable bowel syndrome with diarrhea: Secondary | ICD-10-CM | POA: Diagnosis not present

## 2021-11-29 DIAGNOSIS — Z1331 Encounter for screening for depression: Secondary | ICD-10-CM | POA: Diagnosis not present

## 2021-11-29 DIAGNOSIS — N3281 Overactive bladder: Secondary | ICD-10-CM | POA: Diagnosis not present

## 2021-12-06 ENCOUNTER — Encounter (INDEPENDENT_AMBULATORY_CARE_PROVIDER_SITE_OTHER): Payer: Medicare PPO | Admitting: Ophthalmology

## 2021-12-07 ENCOUNTER — Encounter (INDEPENDENT_AMBULATORY_CARE_PROVIDER_SITE_OTHER): Payer: Medicare PPO | Admitting: Ophthalmology

## 2021-12-07 DIAGNOSIS — I10 Essential (primary) hypertension: Secondary | ICD-10-CM | POA: Diagnosis not present

## 2021-12-07 DIAGNOSIS — H353231 Exudative age-related macular degeneration, bilateral, with active choroidal neovascularization: Secondary | ICD-10-CM

## 2021-12-07 DIAGNOSIS — H35033 Hypertensive retinopathy, bilateral: Secondary | ICD-10-CM | POA: Diagnosis not present

## 2021-12-07 DIAGNOSIS — H43813 Vitreous degeneration, bilateral: Secondary | ICD-10-CM

## 2021-12-10 ENCOUNTER — Telehealth (HOSPITAL_COMMUNITY): Payer: Self-pay | Admitting: Emergency Medicine

## 2021-12-10 NOTE — Telephone Encounter (Signed)
Reaching out to patient to offer assistance regarding upcoming cardiac imaging study; pt verbalizes understanding of appt date/time, parking situation and where to check in, pre-test NPO status and medications ordered, and verified current allergies; name and call back number provided for further questions should they arise Marchia Bond RN Navigator Cardiac Imaging [facility] [phone] office [phone] cell  Arrival 100  Reviewed 13 hr prep times Denies iv issues

## 2021-12-13 ENCOUNTER — Ambulatory Visit (HOSPITAL_COMMUNITY)
Admission: RE | Admit: 2021-12-13 | Discharge: 2021-12-13 | Disposition: A | Discharge status: Home / Self Care | Payer: Medicare PPO | Source: Ambulatory Visit | Attending: Cardiology | Admitting: Cardiology

## 2021-12-13 DIAGNOSIS — R42 Dizziness and giddiness: Secondary | ICD-10-CM | POA: Insufficient documentation

## 2021-12-13 DIAGNOSIS — R0609 Other forms of dyspnea: Secondary | ICD-10-CM | POA: Diagnosis not present

## 2021-12-13 DIAGNOSIS — I700 Atherosclerosis of aorta: Secondary | ICD-10-CM | POA: Insufficient documentation

## 2021-12-13 DIAGNOSIS — R002 Palpitations: Secondary | ICD-10-CM | POA: Insufficient documentation

## 2021-12-13 DIAGNOSIS — I2510 Atherosclerotic heart disease of native coronary artery without angina pectoris: Secondary | ICD-10-CM | POA: Insufficient documentation

## 2021-12-13 LAB — POCT I-STAT CREATININE: Creatinine, Ser: 1.2 mg/dL — ABNORMAL HIGH (ref 0.44–1.00)

## 2021-12-13 MED ORDER — IOHEXOL 350 MG/ML SOLN
95.0000 mL | Freq: Once | INTRAVENOUS | Status: AC | PRN
  Administered 2021-12-13: 95 mL via INTRAVENOUS

## 2021-12-13 MED ORDER — NITROGLYCERIN 0.4 MG SL SUBL
SUBLINGUAL_TABLET | SUBLINGUAL | Status: AC
  Filled 2021-12-13: qty 2

## 2021-12-13 MED ORDER — NITROGLYCERIN 0.4 MG SL SUBL
0.8000 mg | SUBLINGUAL_TABLET | Freq: Once | SUBLINGUAL | Status: AC
  Administered 2021-12-13: 0.8 mg via SUBLINGUAL

## 2021-12-13 MED ORDER — METOPROLOL TARTRATE 5 MG/5ML IV SOLN
INTRAVENOUS | Status: AC
  Administered 2021-12-13: 10 mg via INTRAVENOUS
  Filled 2021-12-13: qty 20

## 2021-12-13 MED ORDER — METOPROLOL TARTRATE 5 MG/5ML IV SOLN
INTRAVENOUS | Status: AC
  Filled 2021-12-13: qty 5

## 2021-12-13 MED ORDER — METOPROLOL TARTRATE 5 MG/5ML IV SOLN
10.0000 mg | INTRAVENOUS | Status: DC | PRN
  Administered 2021-12-13: 5 mg via INTRAVENOUS
  Administered 2021-12-13: 10 mg via INTRAVENOUS

## 2021-12-15 ENCOUNTER — Ambulatory Visit: Payer: Medicare PPO | Attending: Cardiology

## 2021-12-15 ENCOUNTER — Ambulatory Visit (INDEPENDENT_AMBULATORY_CARE_PROVIDER_SITE_OTHER): Payer: Medicare PPO

## 2021-12-15 DIAGNOSIS — R002 Palpitations: Secondary | ICD-10-CM | POA: Diagnosis not present

## 2021-12-15 DIAGNOSIS — R0609 Other forms of dyspnea: Secondary | ICD-10-CM

## 2021-12-15 DIAGNOSIS — R42 Dizziness and giddiness: Secondary | ICD-10-CM

## 2021-12-15 LAB — ECHOCARDIOGRAM COMPLETE
AR max vel: 1.03 cm2
AV Area VTI: 1.01 cm2
AV Area mean vel: 0.96 cm2
AV Mean grad: 15.3 mmHg
AV Peak grad: 27.3 mmHg
Ao pk vel: 2.61 m/s
Area-P 1/2: 2.87 cm2
P 1/2 time: 311 msec
S' Lateral: 1.9 cm

## 2021-12-15 NOTE — Progress Notes (Unsigned)
[id number] from office inventory applied to patient.

## 2021-12-17 HISTORY — PX: OTHER SURGICAL HISTORY: SHX169

## 2021-12-29 DIAGNOSIS — K2289 Other specified disease of esophagus: Secondary | ICD-10-CM | POA: Diagnosis not present

## 2021-12-29 DIAGNOSIS — K2100 Gastro-esophageal reflux disease with esophagitis, without bleeding: Secondary | ICD-10-CM | POA: Diagnosis not present

## 2021-12-29 DIAGNOSIS — R159 Full incontinence of feces: Secondary | ICD-10-CM | POA: Diagnosis not present

## 2022-01-03 DIAGNOSIS — R1319 Other dysphagia: Secondary | ICD-10-CM | POA: Diagnosis not present

## 2022-01-03 DIAGNOSIS — N3281 Overactive bladder: Secondary | ICD-10-CM | POA: Diagnosis not present

## 2022-01-03 DIAGNOSIS — R42 Dizziness and giddiness: Secondary | ICD-10-CM | POA: Diagnosis not present

## 2022-01-03 DIAGNOSIS — R0609 Other forms of dyspnea: Secondary | ICD-10-CM | POA: Diagnosis not present

## 2022-01-03 DIAGNOSIS — R002 Palpitations: Secondary | ICD-10-CM | POA: Diagnosis not present

## 2022-01-03 DIAGNOSIS — R200 Anesthesia of skin: Secondary | ICD-10-CM | POA: Diagnosis not present

## 2022-01-04 ENCOUNTER — Encounter (INDEPENDENT_AMBULATORY_CARE_PROVIDER_SITE_OTHER): Payer: Medicare PPO | Admitting: Ophthalmology

## 2022-01-05 ENCOUNTER — Encounter (INDEPENDENT_AMBULATORY_CARE_PROVIDER_SITE_OTHER): Payer: Medicare PPO | Admitting: Ophthalmology

## 2022-01-05 DIAGNOSIS — H353231 Exudative age-related macular degeneration, bilateral, with active choroidal neovascularization: Secondary | ICD-10-CM | POA: Diagnosis not present

## 2022-01-05 DIAGNOSIS — H43813 Vitreous degeneration, bilateral: Secondary | ICD-10-CM | POA: Diagnosis not present

## 2022-01-05 DIAGNOSIS — H35033 Hypertensive retinopathy, bilateral: Secondary | ICD-10-CM | POA: Diagnosis not present

## 2022-01-05 DIAGNOSIS — I10 Essential (primary) hypertension: Secondary | ICD-10-CM | POA: Diagnosis not present

## 2022-01-18 NOTE — Progress Notes (Unsigned)
Synopsis: Referred for DOE by Charlane Ferretti, MD  Subjective:   PATIENT ID: [name] GENDER: female DOB: 22-Feb-1927, MRN: [id number]  Chief Complaint  Patient presents with   Pulmonary Consult    Referred by Dr Louis Matte. Pt c/o SOB for the past year. She gets winded when she bends over or walking short distances. She states she has occ heaviness in chest- better when she drinks water.    89yF history of DVT, TIA, CKD3, mod AS, diastolic dysfunction  Recent admission for CP, dypsnea, presyncope vs vertigo 10/22/21 found to have severe esophageal dysmotility  Seen by cardiology 11/6 and echo (mod AS), zio patch (overall pretty low burden of SVT), coronary CTA (mild nCAD), vestibular rehab ordreed  Saw GI 12/13 started on H2B, TLC measures discussed  Short of breath over the past year with minimal exertion. Mentions recurrent episodes of presyncope that happen when she gets suddenly dyspneic. This has all been since she had covid-19. She does think it's quite helpful when she uses albuterol.   Otherwise pertinent review of systems is negative.  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 She never smoked, no vaping, MJ.   Past Medical History:  Diagnosis Date   Abnormal Pap smear 1975-76   Acute deep vein thrombosis (DVT) of left peroneal vein (HCC)    Anemia    history of   Arthritis    spine   Breast cyst, left 1980   Cystocele 2012   Diverticulosis    with h/o Diverticulitis   DVT, lower extremity, distal, chronic (HCC) 12/24/2018   GERD (gastroesophageal reflux disease)    H/O hypercholesterolemia    H/O osteopenia    H/O varicella    H/O varicose veins    Hx of Mumps    Macular degeneration    Seasonal allergies    TIA (transient ischemic attack)    Urge incontinence 2012   Urinary frequency 2010     Family History  Problem Relation Age of Onset   Uterine cancer Mother 23   Heart  attack Brother 25   Breast cancer Daughter    Stroke Neg Hx      Past Surgical History:  Procedure Laterality Date   ABDOMINAL HYSTERECTOMY     BLADDER SUSPENSION N/A 06/16/2016   Procedure: TRANSVAGINAL TAPE (TVT) PROCEDURE;  Surgeon: Everett Graff, MD;  Location: [facility];  Service: Gynecology;  Laterality: N/A;   BREAST CYST ASPIRATION  1964   COLONOSCOPY     Dr. Jodene Nam REPAIR N/A 06/16/2016   Procedure: ANTERIOR REPAIR (CYSTOCELE);  Surgeon: Everett Graff, MD;  Location: [facility];  Service: Gynecology;  Laterality: N/A;   CYSTOSCOPY N/A 06/16/2016   Procedure: CYSTOSCOPY;  Surgeon: Everett Graff, MD;  Location: [facility];  Service: Gynecology;  Laterality: N/A;  see anterior repair   Lower Extremity Venous Dopplers  01/09/2012   Right and left lower steroids: No evidence of thrombus or, thrombophlebitis; right and left GSV and SSV: No venous insufficiency. Normal exam.   NM MYOVIEW LTD  11/2016   6.6 METS.  Reached 106% of max.  Heart rate.  Walk for 4:40 min.  EF 72%.  Normal blood pressure response.  upsloping ST segment depression, nonspecific.  Otherwise normal study.  LOW RISK.  No evidence of ischemia or infarction.   TRANSTHORACIC ECHOCARDIOGRAM  01/2018   EF > 65%. LV size small. Gr  1 DD (normal for age).   Normal atrial size. Moderate Aortic Sclerosis without Stenosis. Moderate Mitral Annular Calcification with mild MR & no MS    Social History   Socioeconomic History   Marital status: Married    Spouse name: Not on file   Number of children: 3   Years of education: Not on file   Highest education level: Master's degree (e.g., MA, MS, MEng, MEd, MSW, MBA)  Occupational History    Comment: retired Pharmacist, hospital, principal x 21 years  Tobacco Use   Smoking status: Never    Passive exposure: Never   Smokeless tobacco: Never  Vaping Use   Vaping Use: Never used  Substance and Sexual Activity   Alcohol use: No   Drug use: No   Sexual activity: Yes    Birth  control/protection: Surgical    Comment: Hysterectomy  Other Topics Concern   Not on file  Social History Narrative   She is a married mother of 95, grandmother of 60, great grandmother of 1. She walks daily about 2 miles a day, does not smoke, does not drink.    Lives with huband   Social Determinants of Health   Financial Resource Strain: Not on file  Food Insecurity: Not on file  Transportation Needs: Not on file  Physical Activity: Not on file  Stress: Not on file  Social Connections: Not on file  Intimate Partner Violence: Not on file     Allergies  Allergen Reactions   Atorvastatin Other (See Comments)    Unknown reaction   Desmopressin Acetate Other (See Comments)   Iodinated Contrast Media Hives and Other (See Comments)   Myrbetriq [Mirabegron] Other (See Comments)    "Makes me urinate more frequently"   Iodine Rash     Outpatient Medications Prior to Visit  Medication Sig Dispense Refill   acetaminophen (TYLENOL) 500 MG tablet Take 500-1,000 mg by mouth every 8 (eight) hours as needed for mild pain.      albuterol (VENTOLIN HFA) 108 (90 Base) MCG/ACT inhaler Inhale 2 puffs into the lungs every 4 (four) hours as needed for wheezing or shortness of breath.      aspirin EC 81 MG EC tablet Take 1 tablet (81 mg total) by mouth daily. Swallow whole. 30 tablet 11   Cholecalciferol (VITAMIN D-3) 25 MCG (1000 UT) CAPS Take 1,000 Units by mouth daily with breakfast.      ezetimibe (ZETIA) 10 MG tablet Take 10 mg by mouth daily.     famotidine (PEPCID) 20 MG tablet Take 40 mg by mouth daily.     guaiFENesin (MUCINEX) 600 MG 12 hr tablet Take 1 tablet (600 mg total) by mouth 2 (two) times daily. 30 tablet 0   Lactobacillus Rhamnosus, GG, (CULTURELLE) CAPS Take 1 capsule by mouth every morning.      Magnesium 250 MG TABS Take 250 mg by mouth daily as needed (leg cramps).     metoprolol succinate (TOPROL-XL) 25 MG 24 hr tablet Take 25 mg by mouth daily. Kapspargo sprinkle      Mirabegron (MYRBETRIQ PO) Take 1 tablet by mouth at bedtime.     Multiple Vitamins-Minerals (AIRBORNE PO) Take 1 tablet by mouth daily with breakfast.     Multiple Vitamins-Minerals (CENTRUM SILVER ULTRA WOMENS) TABS Take 1 tablet by mouth daily with breakfast.      Multiple Vitamins-Minerals (ICAPS AREDS 2 PO) Take 1 capsule by mouth daily at 12 noon.     Nutritional Supplements (,FEEDING SUPPLEMENT, PROSOURCE  PLUS) liquid Take 30 mLs by mouth 2 (two) times daily between meals. 887 mL 0   protein supplement shake (PREMIER PROTEIN) LIQD Take 59.1 mLs (2 oz total) by mouth 3 (three) times daily between meals. 10000 mL 0   Trospium Chloride 60 MG CP24 Take 1 capsule by mouth daily.     vitamin B-12 (CYANOCOBALAMIN) 100 MCG tablet Take 100 mcg by mouth daily.     pantoprazole (PROTONIX) 40 MG tablet Take 1 tablet (40 mg total) by mouth 2 (two) times daily before a meal. 30 tablet 0   pravastatin (PRAVACHOL) 20 MG tablet Take 2 tablets (40 mg total) by mouth daily. (Patient taking differently: Take 40 mg by mouth daily. Pt states she takes medication as needed) 30 tablet 2   predniSONE (DELTASONE) 50 MG tablet Prednisone 50 mg - take 13 hours prior to test.Take another Prednisone 50 mg 7 hours prior to test. Take another Prednisone 50 mg 1 hour prior to test. Take Benadryl 50 mg 1 hour prior to test 3 tablet 0   No facility-administered medications prior to visit.       Objective:   Physical Exam:  General appearance: 89 y.o., female, NAD, conversant  Eyes: anicteric sclerae; PERRL, tracking appropriately HENT: NCAT; MMM Neck: Trachea midline; no lymphadenopathy, no JVD Lungs: CTAB, no crackles, no wheeze, with normal respiratory effort CV: RRR, no murmur  Abdomen: Soft, non-tender; non-distended, BS present  Extremities: No peripheral edema, warm Skin: Normal turgor and texture; no rash Psych: Appropriate affect Neuro: Alert and oriented to person and place, no focal deficit      Vitals:   01/19/22 1454  BP: 122/66  Pulse: 67  Temp: 98 F (36.7 C)  TempSrc: Oral  SpO2: 97%  Weight: 124 lb 9.6 oz (56.5 kg)  Height: '5\' 3"'$  (1.6 m)   97% on RA BMI Readings from Last 3 Encounters:  01/19/22 22.07 kg/m  11/22/21 21.89 kg/m  10/22/21 21.61 kg/m   Wt Readings from Last 3 Encounters:  01/19/22 124 lb 9.6 oz (56.5 kg)  11/22/21 123 lb 9.6 oz (56.1 kg)  10/22/21 122 lb (55.3 kg)     CBC    Component Value Date/Time   WBC 9.5 10/22/2021 0046   RBC 4.05 10/22/2021 0046   HGB 12.4 10/22/2021 0046   HCT 33.2 (L) 10/22/2021 0046   PLT 172 10/22/2021 0046   MCV 82.0 10/22/2021 0046   MCV 87.1 03/25/2015 1405   MCH 30.6 10/22/2021 0046   MCHC 37.3 (H) 10/22/2021 0046   RDW 13.1 10/22/2021 0046   LYMPHSABS 4.4 (H) 10/22/2021 0046   MONOABS 0.7 10/22/2021 0046   EOSABS 0.1 10/22/2021 0046   BASOSABS 0.0 10/22/2021 0046     Chest Imaging: CT Coronaries 12/13/21 a few foci of scar at bases  Barium swallow 10/21/21: 1.  Severe esophageal dysmotility.   2. Pulsion diverticulum of lower esophagus just above gastroesophageal junction.   3.  Inability to swallow pill beyond the pharynx.  Pulmonary Functions Testing Results:     No data to display            Echocardiogram 12/15/21:    1. Left ventricular ejection fraction, by estimation, is 60 to 65%. The  left ventricle has normal function. The left ventricle has no regional  wall motion abnormalities. There is mild left ventricular hypertrophy.  Left ventricular diastolic parameters  are indeterminate.   2. Right ventricular systolic function is normal. The right ventricular  size is normal.  3. The mitral valve is degenerative. Mild mitral valve regurgitation. No  evidence of mitral stenosis. Moderate mitral annular calcification.   4. Gradients slightly higher than seen on TTE 04/08/21 . The aortic valve  is normal in structure. Aortic valve regurgitation is mild. Moderate   aortic valve stenosis.   5. The inferior vena cava is normal in size with greater than 50%  respiratory variability, suggesting right atrial pressure of 3 mmHg.       Assessment & Plan:   # DOE  # Recurrent chest tightness/pressure, presyncope Tough to piece together her constellation of symptoms precisely. Potentially multifactorial with deconditioning, moderate AS, esophageal spasm, reactive airways following covid-19 infection, dysautonomia among considerations.  Plan: - trial LABA/ICS in case there is component of reactive airways following covid infection - no role for PFT without robust age-matched control population - GI workup underway     Maryjane Hurter, MD [facility] 01/19/2022 3:06 PM

## 2022-01-19 ENCOUNTER — Ambulatory Visit: Payer: Medicare PPO | Admitting: Student

## 2022-01-19 ENCOUNTER — Encounter: Payer: Self-pay | Admitting: Student

## 2022-01-19 VITALS — BP 122/66 | HR 67 | Temp 98.0°F | Ht 63.0 in | Wt 124.6 lb

## 2022-01-19 DIAGNOSIS — R0600 Dyspnea, unspecified: Secondary | ICD-10-CM | POA: Diagnosis not present

## 2022-01-19 NOTE — Patient Instructions (Addendum)
-   I will price out inhaler options for you that we can in case you have a component of reactive airways after covid-19 infection. We will call you with instructions once we hear back from pharmacy. - albuterol up to every 6 hours as needed and 5-20 minutes before any heavy exertion - See you in 6 weeks or sooner if need be!

## 2022-01-21 ENCOUNTER — Telehealth: Payer: Self-pay | Admitting: Student

## 2022-01-21 NOTE — Telephone Encounter (Signed)
Pt calling to see if Dr. Verlee Monte has called her as he said he would during her last visit. He had to talk to pharmacist. I see no encounter open. Please call to advise. Thanks.

## 2022-01-24 ENCOUNTER — Ambulatory Visit: Payer: Medicare PPO | Attending: Cardiology | Admitting: Cardiology

## 2022-01-24 ENCOUNTER — Encounter: Payer: Self-pay | Admitting: Cardiology

## 2022-01-24 ENCOUNTER — Telehealth: Payer: Self-pay | Admitting: Student

## 2022-01-24 VITALS — BP 120/62 | HR 74 | Ht 63.0 in | Wt 125.0 lb

## 2022-01-24 DIAGNOSIS — I10 Essential (primary) hypertension: Secondary | ICD-10-CM | POA: Diagnosis not present

## 2022-01-24 DIAGNOSIS — E785 Hyperlipidemia, unspecified: Secondary | ICD-10-CM

## 2022-01-24 DIAGNOSIS — R002 Palpitations: Secondary | ICD-10-CM

## 2022-01-24 DIAGNOSIS — R0609 Other forms of dyspnea: Secondary | ICD-10-CM | POA: Diagnosis not present

## 2022-01-24 DIAGNOSIS — R072 Precordial pain: Secondary | ICD-10-CM

## 2022-01-24 MED ORDER — METOPROLOL SUCCINATE ER 25 MG PO TB24: 25.0000 mg | ORAL_TABLET | Freq: Every evening | ORAL | 3 refills | Status: DC

## 2022-01-24 MED ORDER — ASPIRIN 81 MG PO TBEC: 81.0000 mg | DELAYED_RELEASE_TABLET | Freq: Every evening | ORAL | 11 refills | Status: DC

## 2022-01-24 NOTE — Progress Notes (Signed)
Primary Care Provider: Charlane Ferretti, MD [place] Cardiologist: Glenetta Hew, MD Electrophysiologist: None  Clinic Note: Chief Complaint  Patient presents with   Follow-up    64-monthfollow-up to review test results    ===================================  ASSESSMENT/PLAN   Problem List Items Addressed This Visit       Cardiology Problems   Essential hypertension - Primary (Chronic)    Stable BP on current meds.  With her having palpitations that are usually worse at night I would have her switch her Toprol dose to the evening.      Relevant Medications   aspirin EC 81 MG tablet   metoprolol succinate (TOPROL-XL) 25 MG 24 hr tablet   Dyslipidemia, goal LDL below 100 (Chronic)   Relevant Medications   aspirin EC 81 MG tablet   metoprolol succinate (TOPROL-XL) 25 MG 24 hr tablet     Other   DOE (dyspnea on exertion)    Relatively reassuring echocardiogram.  Moderate aortic stenosis may be causing some dyspnea but not to the point that she is noticing it.  No evidence of significant CAD on Coronary CTA.  This would argue against cardiac etiology for her dyspnea.  Likely deconditioning.  Encouraged increased exercise.      Palpitations (Chronic)    Interestingly, she was noting intermittent episodes of fast heart rates which was seen on her monitor, but that was not what she pushed the button for.  I think she probably may be feeling the atrial runs, but they are benign.  I reassured her.  She is on Toprol tolerating well.  I do not want to do too much more because she has had some fatigue issues.  We are switching it to the nighttime to help with some of the fatigue.      Precordial pain (Chronic)    Very reassuring results on Coronary CTA with no evidence of obstructive CAD.  Minimal disease.  Very unlikely that her chest pain is related to coronary artery disease.       ===================================  HPI:    [name] a 89y.o. female  with a PMH notable for HTN and HLD as well as history of Palpitations and DVT who presents today for 264-monthollow-up to discuss test results -> evaluating chest pain, dyspnea and palpitations.  She returns today at the request of [name].  [name] last seen on November 22, 2021.  She was very upset.  Concerned anxious about having exertional dyspnea and intermittent chest pain/chest tightness.  Also having headaches and palpitations.  Noting a squeezing sensation in her chest and short little bursts of fast heart rates.  Chest discomfort in both rest and with exertion.  Also noted it more short of breath than expected walking into the clinic building.  Noted fatigue dizziness and weakness as well.  Very anxious. Evaluate with Coronary CTA, Echo and Zio patch monitor-reviewed below  Recent Hospitalizations: No recent hospitalizations  Reviewed  CV studies:    The following studies were reviewed today: (if available, images/films reviewed: From Epic Chart or Care Everywhere) Cor CTA 12/13/2021:  1. Mild mixed non-obstructive CAD, CADRADS = 2.   2. Coronary calcium score of 255. This was 73rd percentile for age and sex matched control (based on an 8460o).  3. Normal coronary origin with right dominance.   4. Aortic and mitral annular calcification.  5. Aortic atherosclerosis  Echo 12/15/2021: Normal LV Size & Fxn -  EF 60-65%. No RWMA. ~ Diastolic Fxn. Degenerative MV - Mod MAC. ~ Mod AS  14-DAY ZIO PATCH MONITOR RESULTS (1/29-12/13-2023)   Predominant underlying rhythm was Sinus Rhythm: : HR range 47 - 115 bpm, Avg 70 bpm; +/- AV Block (w/ or w/o 1  AVB)   Rare Isolated premature atrial contractions PACs (<1 %) noted, with rare couplets and triplets  Rare isolated premature ventricular contractions (PVCs) (<1 %) noted, with rare couplets and triplets; rare ventricular trigeminy; PVCs are monomorphic   55 Atrial Runs: Fastest 4 Beats w/ HR Range 169-176 bpm, Avg 172 bpm, 1.8 sec -  with multiple spells in succession; Longest 28 Beats w/ HR Range 59-164 bpm, Avg 118 bpm, 15 sec   No Sustained Arrhythmias: Atrial Tachycardia (AT), Supraventricular Tachycardia (SVT), Atrial Fibrillation (A-Fib), Atrial Flutter (A-Flutter), Sustained Ventricular Tachycardia (VT)   Patient Triggers: Sinus rhythm (+/- Tachycardia) with PVCs; Ventricular Trigeminy & Atrial Runs) were not triggered.   Interval History:   [name] returns today very happy to hear the results of her studies.  She says she still has some palpitations but nothing significant.  None no longer really have the chest discomfort, but still having some exertional fatigue.  Really she says that her symptoms all began after she had COVID and just never really recovered fully from it.  CV Review of Symptoms (Summary): positive for - chest pain, dyspnea on exertion, palpitations, and similar symptoms to before, but notably improved. negative for - edema, orthopnea, paroxysmal nocturnal dyspnea, or syncope or near syncope, TIA or amaurosis fugax, claudication  REVIEWED OF SYSTEMS   Review of Systems  Constitutional:  Positive for malaise/fatigue. Negative for weight loss.  HENT:  Negative for congestion and sinus pain.   Respiratory:  Positive for shortness of breath (.ALOHPIBLOCKS). Negative for cough.   Gastrointestinal:  Negative for blood in stool.  Genitourinary:  Negative for hematuria.  Musculoskeletal:  Negative for joint pain and myalgias.  Neurological:  Negative for dizziness and focal weakness.  Psychiatric/Behavioral:  Positive for memory loss. Negative for depression. The patient is nervous/anxious. The patient does not have insomnia.    I have reviewed and (if needed) personally updated the patient's problem list, medications, allergies, past medical and surgical history, social and family history.   PAST MEDICAL HISTORY   Past Medical History:  Diagnosis Date   Abnormal Pap smear 1975-76   Acute  deep vein thrombosis (DVT) of left peroneal vein (HCC)    Anemia    history of   Arthritis    spine   Breast cyst, left 1980   Cystocele 2012   Diverticulosis    with h/o Diverticulitis   DVT, lower extremity, distal, chronic ([place]) 12/24/2018   GERD (gastroesophageal reflux disease)    H/O hypercholesterolemia    H/O osteopenia    H/O varicella    H/O varicose veins    Hx of Mumps    Macular degeneration    Seasonal allergies    TIA (transient ischemic attack)    Urge incontinence 2012   Urinary frequency 2010    PAST SURGICAL HISTORY   Past Surgical History:  Procedure Laterality Date   14-Day Zio Patch  12/2021   Predominantly sinus rhythm open and 47-1:15 PM, average 70 bpm (1  AVB); rare isolated PACs PVCs.  55 atrial runs 4-28 beats fastest heart rate ranges 169 to 176 bpm.  Episodes lasting 1.8 to 15 seconds.  No sustained arrhythmias.  Symptoms not noted with atrial  runs.   ABDOMINAL HYSTERECTOMY     BLADDER SUSPENSION N/A 06/16/2016   Procedure: TRANSVAGINAL TAPE (TVT) PROCEDURE;  Surgeon: Everett Graff, MD;  Location: [facility];  Service: Gynecology;  Laterality: N/A;   BREAST CYST ASPIRATION  1964   COLONOSCOPY     Dr. Earlean Shawl   CT CTA CORONARY W/CA SCORE W/CM &/OR WO/CM  11/2021   1. Mild mixed non-obstructive CAD, CADRADS = 2.   2. Coronary calcium score of 255. This was 73rd percentile for age and sex matched control (based on an 87 yo).  3. Normal coronary origin with right dominance.   4. Aortic and mitral annular calcification.  5. Aortic atherosclerosis   CYSTOCELE REPAIR N/A 06/16/2016   Procedure: ANTERIOR REPAIR (CYSTOCELE);  Surgeon: Everett Graff, MD;  Location: [facility];  Service: Gynecology;  Laterality: N/A;   CYSTOSCOPY N/A 06/16/2016   Procedure: CYSTOSCOPY;  Surgeon: Everett Graff, MD;  Location: [facility];  Service: Gynecology;  Laterality: N/A;  see anterior repair   Lower Extremity Venous Dopplers  01/09/2012   Right and left lower steroids: No  evidence of thrombus or, thrombophlebitis; right and left GSV and SSV: No venous insufficiency. Normal exam.   NM MYOVIEW LTD  11/2016   6.6 METS.  Reached 106% of max.  Heart rate.  Walk for 4:40 min.  EF 72%.  Normal blood pressure response.  upsloping ST segment depression, nonspecific.  Otherwise normal study.  LOW RISK.  No evidence of ischemia or infarction.   TRANSTHORACIC ECHOCARDIOGRAM  11/2021   Normal LV Size & Fxn - EF 60-65%. No RWMA. ~ Diastolic Fxn. Degenerative MV - Mod MAC. ~ Mod AS    Immunization History  Administered Date(s) Administered   Influenza, High Dose Seasonal PF 10/30/2013   Influenza-Unspecified 10/17/2021   PFIZER(Purple Top)SARS-COV-2 Vaccination 04/26/2019, 05/20/2019   Pneumococcal Conjugate-13 10/30/2013, 11/15/2017   Tdap 09/25/2011    MEDICATIONS/ALLERGIES   Current Meds  Medication Sig   acetaminophen (TYLENOL) 500 MG tablet Take 500-1,000 mg by mouth every 8 (eight) hours as needed for mild pain.    albuterol (VENTOLIN HFA) 108 (90 Base) MCG/ACT inhaler Inhale 2 puffs into the lungs every 4 (four) hours as needed for wheezing or shortness of breath.    Cholecalciferol (VITAMIN D-3) 25 MCG (1000 UT) CAPS Take 1,000 Units by mouth daily with breakfast.    famotidine (PEPCID) 20 MG tablet Take 40 mg by mouth daily.   guaiFENesin (MUCINEX) 600 MG 12 hr tablet Take 1 tablet (600 mg total) by mouth 2 (two) times daily.   Lactobacillus Rhamnosus, GG, (CULTURELLE) CAPS Take 1 capsule by mouth every morning.    Magnesium 250 MG TABS Take 250 mg by mouth daily as needed (leg cramps).   Mirabegron (MYRBETRIQ PO) Take 1 tablet by mouth at bedtime.   Multiple Vitamins-Minerals (AIRBORNE PO) Take 1 tablet by mouth daily with breakfast.   Multiple Vitamins-Minerals (CENTRUM SILVER ULTRA WOMENS) TABS Take 1 tablet by mouth daily with breakfast.    Multiple Vitamins-Minerals (ICAPS AREDS 2 PO) Take 1 capsule by mouth daily at 12 noon.   Nutritional Supplements  (,FEEDING SUPPLEMENT, PROSOURCE PLUS) liquid Take 30 mLs by mouth 2 (two) times daily between meals.   protein supplement shake (PREMIER PROTEIN) LIQD Take 59.1 mLs (2 oz total) by mouth 3 (three) times daily between meals.   Trospium Chloride 60 MG CP24 Take 1 capsule by mouth daily.   vitamin B-12 (CYANOCOBALAMIN) 100 MCG tablet Take 100 mcg by mouth daily.   [  DISCONTINUED] aspirin EC 81 MG EC tablet Take 1 tablet (81 mg total) by mouth daily. Swallow whole.   [DISCONTINUED] metoprolol succinate (TOPROL-XL) 25 MG 24 hr tablet Take 25 mg by mouth daily. Kapspargo sprinkle    Allergies  Allergen Reactions   Atorvastatin Other (See Comments)    Unknown reaction   Desmopressin Acetate Other (See Comments)   Iodinated Contrast Media Hives and Other (See Comments)   Myrbetriq [Mirabegron] Other (See Comments)    "Makes me urinate more frequently"   Iodine Rash    SOCIAL HISTORY/FAMILY HISTORY   Reviewed in Epic:  Pertinent findings:  Social History   Tobacco Use   Smoking status: Never    Passive exposure: Never   Smokeless tobacco: Never  Vaping Use   Vaping Use: Never used  Substance Use Topics   Alcohol use: No   Drug use: No   Social History   Social History Narrative   She is a married mother of 47, grandmother of 36, great grandmother of 72. She walks daily about 2 miles a day, does not smoke, does not drink.    Lives with huband    OBJCTIVE -PE, EKG, labs   Wt Readings from Last 3 Encounters:  01/24/22 125 lb (56.7 kg)  01/19/22 124 lb 9.6 oz (56.5 kg)  11/22/21 123 lb 9.6 oz (56.1 kg)    Physical Exam: BP 120/62 (BP Location: Left Arm, Patient Position: Sitting, Cuff Size: Normal)   Pulse 74   Ht _0  (1.6 m)   Wt 125 lb (56.7 kg)   SpO2 99%   BMI 22.14 kg/m  Physical Exam Vitals reviewed.  Constitutional:      General: She is not in acute distress.    Appearance: Normal appearance. She is normal weight. She is not ill-appearing (Healthy-appearing.   Well-groomed.) or toxic-appearing.  HENT:     Head: Normocephalic and atraumatic.  Neck:     Vascular: No carotid bruit or JVD.  Cardiovascular:     Rate and Rhythm: Normal rate and regular rhythm. No extrasystoles are present.    Chest Wall: PMI is not displaced.     Pulses: Normal pulses and intact distal pulses.     Heart sounds: No murmur heard.    No friction rub. No gallop.     Comments: Normal S1, split S2 Pulmonary:     Effort: Pulmonary effort is normal. No respiratory distress.     Breath sounds: Normal breath sounds. No wheezing, rhonchi or rales.  Chest:     Chest wall: No tenderness.  Musculoskeletal:        General: No swelling. Normal range of motion.     Cervical back: Normal range of motion and neck supple.  Skin:    General: Skin is dry.  Neurological:     General: No focal deficit present.     Mental Status: She is alert and oriented to person, place, and time. Mental status is at baseline.     Gait: Gait normal.  Psychiatric:        Mood and Affect: Mood normal.        Behavior: Behavior normal.        Thought Content: Thought content normal.        Judgment: Judgment normal.      Adult ECG Report Not checked  Recent Labs: Reviewed Lab Results  Component Value Date   CHOL 214 (H) 10/20/2021   HDL 81 10/20/2021   LDLCALC 129 (H) 10/20/2021  TRIG 20 10/20/2021   CHOLHDL 2.6 10/20/2021   Lab Results  Component Value Date   CREATININE 1.20 (H) 12/13/2021   BUN 42 (H) 10/22/2021   NA 134 (L) 10/22/2021   K 4.0 10/22/2021   CL 101 10/22/2021   CO2 24 10/22/2021      Latest Ref Rng & Units 10/22/2021   12:46 AM 10/21/2021   12:49 AM 10/20/2021    1:01 AM  CBC  WBC 4.0 - 10.5 K/uL 9.5  18.7  9.2   Hemoglobin 12.0 - 15.0 g/dL 12.4  12.4  13.9   Hematocrit 36.0 - 46.0 % 33.2  33.7  38.6   Platelets 150 - 400 K/uL 172  170  183     Lab Results  Component Value Date   HGBA1C 5.7 (H) 04/05/2021   Lab Results  Component Value Date   TSH  0.628 10/19/2021    ================================================== I spent a total of 24 minutes with the patient spent in direct patient consultation.  Additional time spent with chart review  / charting (studies, outside notes, etc): 10 min Total Time: 34 min  Current medicines are reviewed at length with the patient today.  (+/- concerns) N/A  Notice: This dictation was prepared with Dragon dictation along with smart phrase technology. Any transcriptional errors that result from this process are unintentional and may not be corrected upon review.  Studies Ordered:   No orders of the defined types were placed in this encounter.  Meds ordered this encounter  Medications   aspirin EC 81 MG tablet    Sig: Take 1 tablet (81 mg total) by mouth every evening. Swallow whole.    Dispense:  30 tablet    Refill:  11   metoprolol succinate (TOPROL-XL) 25 MG 24 hr tablet    Sig: Take 1 tablet (25 mg total) by mouth every evening. Kapspargo sprinkle    Dispense:  90 tablet    Refill:  3    Patient Instructions / Medication Changes & Studies & Tests Ordered   Patient Instructions  Medication Instructions:   Take your Aspirin  and Metoprolol  in the evening time . Start tomorrow taking both.  *If you need a refill on your cardiac medications before your next appointment, please call your pharmacy*   Lab Work: Not needed     Testing/Procedures: Not needed   Follow-Up: At [facility], you and your health needs are our priority.  As part of our continuing mission to provide you with exceptional heart care, we have created designated Provider Care Teams.  These Care Teams include your primary Cardiologist (physician) and Advanced Practice Providers (APPs -  Physician Assistants and Nurse Practitioners) who all work together to provide you with the care you need, when you need it.     Your next appointment:   6 month(s)  call in  March for an appt in July 2024  The format  for your next appointment:   In Person  Provider:   Glenetta Hew, MD       Leonie Man, MD, MS Glenetta Hew, M.D., M.S. Interventional Cardiologist  [place]  Pager # [phone] Phone # [phone] [place], [place] [address]   Thank you for choosing [facility]!!

## 2022-01-24 NOTE — Telephone Encounter (Signed)
Just sent message to pharmacy

## 2022-01-24 NOTE — Telephone Encounter (Signed)
Dr. Verlee Monte pt called to see if you had heard any information on test claims for inhalers. Please advise.

## 2022-01-24 NOTE — Telephone Encounter (Signed)
What is least expensive laba/ics for her?  Thanks! 

## 2022-01-24 NOTE — Patient Instructions (Addendum)
Medication Instructions:   Take your Aspirin  and Metoprolol  in the evening time . Start tomorrow taking both.  *If you need a refill on your cardiac medications before your next appointment, please call your pharmacy*   Lab Work: Not needed     Testing/Procedures: Not needed   Follow-Up: At [facility], you and your health needs are our priority.  As part of our continuing mission to provide you with exceptional heart care, we have created designated Provider Care Teams.  These Care Teams include your primary Cardiologist (physician) and Advanced Practice Providers (APPs -  Physician Assistants and Nurse Practitioners) who all work together to provide you with the care you need, when you need it.     Your next appointment:   6 month(s)  call in  March for an appt in July 2024  The format for your next appointment:   In Person  Provider:   Glenetta Hew, MD

## 2022-01-27 ENCOUNTER — Other Ambulatory Visit (HOSPITAL_COMMUNITY): Payer: Self-pay

## 2022-01-27 NOTE — Telephone Encounter (Signed)
Wixela/Advair Diskus generic seems to be the preferred ICS/LABA for this patient at this time.

## 2022-02-01 ENCOUNTER — Encounter (INDEPENDENT_AMBULATORY_CARE_PROVIDER_SITE_OTHER): Payer: Medicare PPO | Admitting: Ophthalmology

## 2022-02-01 DIAGNOSIS — I10 Essential (primary) hypertension: Secondary | ICD-10-CM

## 2022-02-01 DIAGNOSIS — H35033 Hypertensive retinopathy, bilateral: Secondary | ICD-10-CM

## 2022-02-01 DIAGNOSIS — H353231 Exudative age-related macular degeneration, bilateral, with active choroidal neovascularization: Secondary | ICD-10-CM

## 2022-02-01 DIAGNOSIS — H43813 Vitreous degeneration, bilateral: Secondary | ICD-10-CM | POA: Diagnosis not present

## 2022-02-03 MED ORDER — FLUTICASONE-SALMETEROL 100-50 MCG/ACT IN AEPB: 1.0000 | INHALATION_SPRAY | Freq: Two times a day (BID) | RESPIRATORY_TRACT | 11 refills | Status: DC

## 2022-02-03 NOTE — Telephone Encounter (Signed)
Pt called back and is asking again about inhaler  Looks like Grant Ruts is preferred  Please advise on strength, thanks

## 2022-02-03 NOTE — Telephone Encounter (Signed)
I called the pt and left detailed msg per pt request that this was done  Nothing further needed

## 2022-02-03 NOTE — Telephone Encounter (Signed)
Apologies for delay, advair 1 puff twice daily sent to [facility] [address], needs to rinse mouth, brush teeth/tongue after each use

## 2022-02-05 ENCOUNTER — Encounter: Payer: Self-pay | Admitting: Cardiology

## 2022-02-05 NOTE — Assessment & Plan Note (Signed)
Relatively reassuring echocardiogram.  Moderate aortic stenosis may be causing some dyspnea but not to the point that she is noticing it.  No evidence of significant CAD on Coronary CTA.  This would argue against cardiac etiology for her dyspnea.  Likely deconditioning.  Encouraged increased exercise.

## 2022-02-05 NOTE — Assessment & Plan Note (Signed)
Stable BP on current meds.  With her having palpitations that are usually worse at night I would have her switch her Toprol dose to the evening.

## 2022-02-05 NOTE — Assessment & Plan Note (Signed)
Interestingly, she was noting intermittent episodes of fast heart rates which was seen on her monitor, but that was not what she pushed the button for.  I think she probably may be feeling the atrial runs, but they are benign.  I reassured her.  She is on Toprol tolerating well.  I do not want to do too much more because she has had some fatigue issues.  We are switching it to the nighttime to help with some of the fatigue.

## 2022-02-05 NOTE — Assessment & Plan Note (Signed)
Very reassuring results on Coronary CTA with no evidence of obstructive CAD.  Minimal disease.  Very unlikely that her chest pain is related to coronary artery disease.

## 2022-03-01 ENCOUNTER — Encounter (INDEPENDENT_AMBULATORY_CARE_PROVIDER_SITE_OTHER): Payer: Medicare PPO | Admitting: Ophthalmology

## 2022-03-02 ENCOUNTER — Ambulatory Visit: Payer: Medicare PPO | Admitting: Student

## 2022-03-02 DIAGNOSIS — Z1231 Encounter for screening mammogram for malignant neoplasm of breast: Secondary | ICD-10-CM | POA: Diagnosis not present

## 2022-03-02 DIAGNOSIS — Z01419 Encounter for gynecological examination (general) (routine) without abnormal findings: Secondary | ICD-10-CM | POA: Diagnosis not present

## 2022-03-02 DIAGNOSIS — N952 Postmenopausal atrophic vaginitis: Secondary | ICD-10-CM | POA: Diagnosis not present

## 2022-03-02 DIAGNOSIS — R159 Full incontinence of feces: Secondary | ICD-10-CM | POA: Diagnosis not present

## 2022-03-05 NOTE — Progress Notes (Unsigned)
Synopsis: Referred for DOE by Charlane Ferretti, MD  Subjective:   PATIENT ID: [name] GENDER: female DOB: 1927-06-23, MRN: [id number]  No chief complaint on file.  CC: dyspnea, chest tightness  89yF history of DVT, TIA, CKD3, mod AS, diastolic dysfunction  Recent admission for CP, dypsnea, presyncope vs vertigo 10/22/21 found to have severe esophageal dysmotility  Seen by cardiology 11/6 and echo (mod AS), zio patch (overall pretty low burden of SVT), coronary CTA (mild nCAD), vestibular rehab ordreed  Saw GI 12/13 started on H2B, TLC measures discussed  Short of breath over the past year with minimal exertion. Mentions recurrent episodes of presyncope that happen when she gets suddenly dyspneic. This has all been since she had covid-19 2021. She does think it's quite helpful when she uses albuterol.  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 She never smoked, no vaping, MJ.   Interval HPI  Last visit prescribed wixela 100 1 puff bid but she had reservations about starting since she wasn't able to see inhaler technique demonstrated in clinic and worried about risk of thrush.  Still using albuterol periodically to decent effect.   Otherwise pertinent review of systems is negative.  Past Medical History:  Diagnosis Date   Abnormal Pap smear 1975-76   Acute deep vein thrombosis (DVT) of left peroneal vein (HCC)    Anemia    history of   Arthritis    spine   Breast cyst, left 1980   Cystocele 2012   Diverticulosis    with h/o Diverticulitis   DVT, lower extremity, distal, chronic (HCC) 12/24/2018   GERD (gastroesophageal reflux disease)    H/O hypercholesterolemia    H/O osteopenia    H/O varicella    H/O varicose veins    Hx of Mumps    Macular degeneration    Seasonal allergies    TIA (transient ischemic attack)    Urge incontinence 2012   Urinary frequency 2010     Family History   Problem Relation Age of Onset   Uterine cancer Mother 75   Heart attack Brother 19   Breast cancer Daughter    Stroke Neg Hx      Past Surgical History:  Procedure Laterality Date   14-Day Zio Patch  12/2021   Predominantly sinus rhythm open and 47-1:15 PM, average 70 bpm (1  AVB); rare isolated PACs PVCs.  55 atrial runs 4-28 beats fastest heart rate ranges 169 to 176 bpm.  Episodes lasting 1.8 to 15 seconds.  No sustained arrhythmias.  Symptoms not noted with atrial runs.   ABDOMINAL HYSTERECTOMY     BLADDER SUSPENSION N/A 06/16/2016   Procedure: TRANSVAGINAL TAPE (TVT) PROCEDURE;  Surgeon: Everett Graff, MD;  Location: [facility];  Service: Gynecology;  Laterality: N/A;   BREAST CYST ASPIRATION  1964   COLONOSCOPY     Dr. Earlean Shawl   CT CTA CORONARY W/CA SCORE W/CM &/OR WO/CM  11/2021   1. Mild mixed non-obstructive CAD, CADRADS = 2.   2. Coronary calcium score of 255. This was 73rd percentile for age and sex matched control (based on an 89 yo).  3. Normal coronary origin with right dominance.   4. Aortic and mitral annular calcification.  5. Aortic atherosclerosis   CYSTOCELE REPAIR N/A 06/16/2016   Procedure: ANTERIOR REPAIR (CYSTOCELE);  Surgeon: Everett Graff, MD;  Location: [facility];  Service: Gynecology;  Laterality: N/A;  CYSTOSCOPY N/A 06/16/2016   Procedure: CYSTOSCOPY;  Surgeon: Everett Graff, MD;  Location: [facility];  Service: Gynecology;  Laterality: N/A;  see anterior repair   Lower Extremity Venous Dopplers  01/09/2012   Right and left lower steroids: No evidence of thrombus or, thrombophlebitis; right and left GSV and SSV: No venous insufficiency. Normal exam.   NM MYOVIEW LTD  11/2016   6.6 METS.  Reached 106% of max.  Heart rate.  Walk for 4:40 min.  EF 72%.  Normal blood pressure response.  upsloping ST segment depression, nonspecific.  Otherwise normal study.  LOW RISK.  No evidence of ischemia or infarction.   TRANSTHORACIC ECHOCARDIOGRAM  11/2021   Normal LV Size &  Fxn - EF 60-65%. No RWMA. ~ Diastolic Fxn. Degenerative MV - Mod MAC. ~ Mod AS    Social History   Socioeconomic History   Marital status: Married    Spouse name: Not on file   Number of children: 3   Years of education: Not on file   Highest education level: Master's degree (e.g., MA, MS, MEng, MEd, MSW, MBA)  Occupational History    Comment: retired Pharmacist, hospital, principal x 21 years  Tobacco Use   Smoking status: Never    Passive exposure: Never   Smokeless tobacco: Never  Vaping Use   Vaping Use: Never used  Substance and Sexual Activity   Alcohol use: No   Drug use: No   Sexual activity: Yes    Birth control/protection: Surgical    Comment: Hysterectomy  Other Topics Concern   Not on file  Social History Narrative   She is a married mother of 60, grandmother of 44, great grandmother of 1. She walks daily about 2 miles a day, does not smoke, does not drink.    Lives with huband   Social Determinants of Health   Financial Resource Strain: Not on file  Food Insecurity: Not on file  Transportation Needs: Not on file  Physical Activity: Not on file  Stress: Not on file  Social Connections: Not on file  Intimate Partner Violence: Not on file     Allergies  Allergen Reactions   Atorvastatin Other (See Comments)    Unknown reaction   Desmopressin Acetate Other (See Comments)   Iodinated Contrast Media Hives and Other (See Comments)   Myrbetriq [Mirabegron] Other (See Comments)    "Makes me urinate more frequently"   Iodine Rash     Outpatient Medications Prior to Visit  Medication Sig Dispense Refill   acetaminophen (TYLENOL) 500 MG tablet Take 500-1,000 mg by mouth every 8 (eight) hours as needed for mild pain.      albuterol (VENTOLIN HFA) 108 (90 Base) MCG/ACT inhaler Inhale 2 puffs into the lungs every 4 (four) hours as needed for wheezing or shortness of breath.      aspirin EC 81 MG tablet Take 1 tablet (81 mg total) by mouth every evening. Swallow whole. 30  tablet 11   Cholecalciferol (VITAMIN D-3) 25 MCG (1000 UT) CAPS Take 1,000 Units by mouth daily with breakfast.      famotidine (PEPCID) 20 MG tablet Take 40 mg by mouth daily.     fluticasone-salmeterol (WIXELA INHUB) 100-50 MCG/ACT AEPB Inhale 1 puff into the lungs 2 (two) times daily. 1 each 11   guaiFENesin (MUCINEX) 600 MG 12 hr tablet Take 1 tablet (600 mg total) by mouth 2 (two) times daily. 30 tablet 0   Lactobacillus Rhamnosus, GG, (CULTURELLE) CAPS Take 1 capsule by  mouth every morning.      Magnesium 250 MG TABS Take 250 mg by mouth daily as needed (leg cramps).     metoprolol succinate (TOPROL-XL) 25 MG 24 hr tablet Take 1 tablet (25 mg total) by mouth every evening. Kapspargo sprinkle 90 tablet 3   Mirabegron (MYRBETRIQ PO) Take 1 tablet by mouth at bedtime.     Multiple Vitamins-Minerals (AIRBORNE PO) Take 1 tablet by mouth daily with breakfast.     Multiple Vitamins-Minerals (CENTRUM SILVER ULTRA WOMENS) TABS Take 1 tablet by mouth daily with breakfast.      Multiple Vitamins-Minerals (ICAPS AREDS 2 PO) Take 1 capsule by mouth daily at 12 noon.     Nutritional Supplements (,FEEDING SUPPLEMENT, PROSOURCE PLUS) liquid Take 30 mLs by mouth 2 (two) times daily between meals. 887 mL 0   protein supplement shake (PREMIER PROTEIN) LIQD Take 59.1 mLs (2 oz total) by mouth 3 (three) times daily between meals. 10000 mL 0   Trospium Chloride 60 MG CP24 Take 1 capsule by mouth daily.     vitamin B-12 (CYANOCOBALAMIN) 100 MCG tablet Take 100 mcg by mouth daily.     No facility-administered medications prior to visit.       Objective:   Physical Exam:  General appearance: 89 y.o., female, NAD, conversant  Eyes: anicteric sclerae; PERRL, tracking appropriately HENT: NCAT; MMM Neck: Trachea midline; no lymphadenopathy, no JVD Lungs: CTAB, no crackles, no wheeze, with normal respiratory effort CV: RRR, + systolic murmur Abdomen: Soft, non-tender; non-distended, BS present   Extremities: No peripheral edema, warm Skin: Normal turgor and texture; no rash Psych: Appropriate affect Neuro: Alert and oriented to person and place, no focal deficit     Vitals:   03/07/22 1533  BP: 122/64  Pulse: 63  SpO2: 99%  Weight: 127 lb 12.8 oz (58 kg)  Height: 5' 3"$  (1.6 m)    99% on RA BMI Readings from Last 3 Encounters:  03/07/22 22.64 kg/m  01/24/22 22.14 kg/m  01/19/22 22.07 kg/m   Wt Readings from Last 3 Encounters:  03/07/22 127 lb 12.8 oz (58 kg)  01/24/22 125 lb (56.7 kg)  01/19/22 124 lb 9.6 oz (56.5 kg)     CBC    Component Value Date/Time   WBC 9.5 10/22/2021 0046   RBC 4.05 10/22/2021 0046   HGB 12.4 10/22/2021 0046   HCT 33.2 (L) 10/22/2021 0046   PLT 172 10/22/2021 0046   MCV 82.0 10/22/2021 0046   MCV 87.1 03/25/2015 1405   MCH 30.6 10/22/2021 0046   MCHC 37.3 (H) 10/22/2021 0046   RDW 13.1 10/22/2021 0046   LYMPHSABS 4.4 (H) 10/22/2021 0046   MONOABS 0.7 10/22/2021 0046   EOSABS 0.1 10/22/2021 0046   BASOSABS 0.0 10/22/2021 0046     Chest Imaging: CT Coronaries 12/13/21 a few foci of scar at bases  Barium swallow 10/21/21: 1.  Severe esophageal dysmotility.   2. Pulsion diverticulum of lower esophagus just above gastroesophageal junction.   3.  Inability to swallow pill beyond the pharynx.  Pulmonary Functions Testing Results:     No data to display            Echocardiogram 12/15/21:    1. Left ventricular ejection fraction, by estimation, is 60 to 65%. The  left ventricle has normal function. The left ventricle has no regional  wall motion abnormalities. There is mild left ventricular hypertrophy.  Left ventricular diastolic parameters  are indeterminate.   2. Right ventricular systolic function  is normal. The right ventricular  size is normal.   3. The mitral valve is degenerative. Mild mitral valve regurgitation. No  evidence of mitral stenosis. Moderate mitral annular calcification.   4. Gradients  slightly higher than seen on TTE 04/08/21 . The aortic valve  is normal in structure. Aortic valve regurgitation is mild. Moderate  aortic valve stenosis.   5. The inferior vena cava is normal in size with greater than 50%  respiratory variability, suggesting right atrial pressure of 3 mmHg.       Assessment & Plan:   # DOE  # Recurrent chest tightness/pressure, presyncope Tough to piece together her constellation of symptoms precisely. Potentially multifactorial with deconditioning, moderate AS, esophageal spasm, reactive airways following covid-19 infection, dysautonomia among considerations  Plan: - try spiriva 2 puffs once daily everyday - technique demonstrated in clinic. Wait to use wixela inhaler until I see you again in clinic - she worries about risk of thrush despite reassurance it could be easily treated and inhaler discontinued if she had it.  - albuterol 1-2 puffs as needed and can use 5-20 minutes before your walk to see if it helps - see you in 4 weeks or sooner if need be!     Maryjane Hurter, MD [facility] 03/07/2022 5:58 PM

## 2022-03-07 ENCOUNTER — Encounter: Payer: Self-pay | Admitting: Student

## 2022-03-07 ENCOUNTER — Ambulatory Visit: Payer: Medicare PPO | Admitting: Student

## 2022-03-07 VITALS — BP 122/64 | HR 63 | Ht 63.0 in | Wt 127.8 lb

## 2022-03-07 DIAGNOSIS — R0600 Dyspnea, unspecified: Secondary | ICD-10-CM

## 2022-03-07 NOTE — Patient Instructions (Signed)
-   try spiriva 2 puffs once daily everyday. Wait to use wixela inhaler until I see you again in clinic - albuterol 1-2 puffs as needed and can use 5-20 minutes before your walk to see if it helps - see you in 4 weeks or sooner if need be!

## 2022-03-08 ENCOUNTER — Encounter (INDEPENDENT_AMBULATORY_CARE_PROVIDER_SITE_OTHER): Payer: Medicare PPO | Admitting: Ophthalmology

## 2022-03-08 DIAGNOSIS — I10 Essential (primary) hypertension: Secondary | ICD-10-CM

## 2022-03-08 DIAGNOSIS — H43813 Vitreous degeneration, bilateral: Secondary | ICD-10-CM | POA: Diagnosis not present

## 2022-03-08 DIAGNOSIS — H353231 Exudative age-related macular degeneration, bilateral, with active choroidal neovascularization: Secondary | ICD-10-CM

## 2022-03-08 DIAGNOSIS — H35033 Hypertensive retinopathy, bilateral: Secondary | ICD-10-CM

## 2022-03-16 DIAGNOSIS — H353221 Exudative age-related macular degeneration, left eye, with active choroidal neovascularization: Secondary | ICD-10-CM | POA: Diagnosis not present

## 2022-03-16 DIAGNOSIS — H35372 Puckering of macula, left eye: Secondary | ICD-10-CM | POA: Diagnosis not present

## 2022-03-16 DIAGNOSIS — H353212 Exudative age-related macular degeneration, right eye, with inactive choroidal neovascularization: Secondary | ICD-10-CM | POA: Diagnosis not present

## 2022-03-16 DIAGNOSIS — H40013 Open angle with borderline findings, low risk, bilateral: Secondary | ICD-10-CM | POA: Diagnosis not present

## 2022-03-22 DIAGNOSIS — N3941 Urge incontinence: Secondary | ICD-10-CM | POA: Diagnosis not present

## 2022-03-22 DIAGNOSIS — N183 Chronic kidney disease, stage 3 unspecified: Secondary | ICD-10-CM | POA: Diagnosis not present

## 2022-03-22 DIAGNOSIS — N3281 Overactive bladder: Secondary | ICD-10-CM | POA: Diagnosis not present

## 2022-03-22 DIAGNOSIS — R350 Frequency of micturition: Secondary | ICD-10-CM | POA: Diagnosis not present

## 2022-03-22 DIAGNOSIS — Z87440 Personal history of urinary (tract) infections: Secondary | ICD-10-CM | POA: Diagnosis not present

## 2022-03-22 DIAGNOSIS — R351 Nocturia: Secondary | ICD-10-CM | POA: Diagnosis not present

## 2022-03-30 DIAGNOSIS — E7800 Pure hypercholesterolemia, unspecified: Secondary | ICD-10-CM | POA: Diagnosis not present

## 2022-03-30 DIAGNOSIS — Z8673 Personal history of transient ischemic attack (TIA), and cerebral infarction without residual deficits: Secondary | ICD-10-CM | POA: Diagnosis not present

## 2022-03-30 DIAGNOSIS — N1831 Chronic kidney disease, stage 3a: Secondary | ICD-10-CM | POA: Diagnosis not present

## 2022-03-30 DIAGNOSIS — R2689 Other abnormalities of gait and mobility: Secondary | ICD-10-CM | POA: Diagnosis not present

## 2022-03-30 DIAGNOSIS — G319 Degenerative disease of nervous system, unspecified: Secondary | ICD-10-CM | POA: Diagnosis not present

## 2022-03-30 DIAGNOSIS — R0609 Other forms of dyspnea: Secondary | ICD-10-CM | POA: Diagnosis not present

## 2022-03-30 DIAGNOSIS — R1319 Other dysphagia: Secondary | ICD-10-CM | POA: Diagnosis not present

## 2022-04-01 NOTE — Progress Notes (Unsigned)
Synopsis: Referred for DOE by Charlane Ferretti, MD  Subjective:   PATIENT ID: [name] GENDER: female DOB: 1927-09-12, MRN: [id number]  No chief complaint on file.  CC: dyspnea, chest tightness  89yF history of DVT, TIA, CKD3, mod AS, diastolic dysfunction  Recent admission for CP, dypsnea, presyncope vs vertigo 10/22/21 found to have severe esophageal dysmotility  Seen by cardiology 11/6 and echo (mod AS), zio patch (overall pretty low burden of SVT), coronary CTA (mild nCAD), vestibular rehab ordreed  Saw GI 12/13 started on H2B, TLC measures discussed  Short of breath over the past year with minimal exertion. Mentions recurrent episodes of presyncope that happen when she gets suddenly dyspneic. This has all been since she had covid-19 2021. She does think it's quite helpful when she uses albuterol.  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 She never smoked, no vaping, MJ.   Interval HPI  Last visit prescribed wixela 100 1 puff bid but she had reservations about starting since she wasn't able to see inhaler technique demonstrated in clinic and worried about risk of thrush.  Still using albuterol periodically to decent effect.   -------------------------------- Last visit started on spiriva - planned to try wixela if no response  Otherwise pertinent review of systems is negative.  Past Medical History:  Diagnosis Date   Abnormal Pap smear 1975-76   Acute deep vein thrombosis (DVT) of left peroneal vein (HCC)    Anemia    history of   Arthritis    spine   Breast cyst, left 1980   Cystocele 2012   Diverticulosis    with h/o Diverticulitis   DVT, lower extremity, distal, chronic (HCC) 12/24/2018   GERD (gastroesophageal reflux disease)    H/O hypercholesterolemia    H/O osteopenia    H/O varicella    H/O varicose veins    Hx of Mumps    Macular degeneration    Seasonal allergies     TIA (transient ischemic attack)    Urge incontinence 2012   Urinary frequency 2010     Family History  Problem Relation Age of Onset   Uterine cancer Mother 77   Heart attack Brother 16   Breast cancer Daughter    Stroke Neg Hx      Past Surgical History:  Procedure Laterality Date   14-Day Zio Patch  12/2021   Predominantly sinus rhythm open and 47-1:15 PM, average 70 bpm (1  AVB); rare isolated PACs PVCs.  55 atrial runs 4-28 beats fastest heart rate ranges 169 to 176 bpm.  Episodes lasting 1.8 to 15 seconds.  No sustained arrhythmias.  Symptoms not noted with atrial runs.   ABDOMINAL HYSTERECTOMY     BLADDER SUSPENSION N/A 06/16/2016   Procedure: TRANSVAGINAL TAPE (TVT) PROCEDURE;  Surgeon: Everett Graff, MD;  Location: [facility];  Service: Gynecology;  Laterality: N/A;   BREAST CYST ASPIRATION  1964   COLONOSCOPY     Dr. Earlean Shawl   CT CTA CORONARY W/CA SCORE W/CM &/OR WO/CM  11/2021   1. Mild mixed non-obstructive CAD, CADRADS = 2.   2. Coronary calcium score of 255. This was 73rd percentile for age and sex matched control (based on an 89 yo).  3. Normal coronary origin with right dominance.   4. Aortic and mitral annular calcification.  5. Aortic atherosclerosis   CYSTOCELE REPAIR N/A 06/16/2016   Procedure: ANTERIOR REPAIR (CYSTOCELE);  Surgeon: Everett Graff, MD;  Location: [facility];  Service: Gynecology;  Laterality: N/A;   CYSTOSCOPY N/A 06/16/2016   Procedure: CYSTOSCOPY;  Surgeon: Everett Graff, MD;  Location: [facility];  Service: Gynecology;  Laterality: N/A;  see anterior repair   Lower Extremity Venous Dopplers  01/09/2012   Right and left lower steroids: No evidence of thrombus or, thrombophlebitis; right and left GSV and SSV: No venous insufficiency. Normal exam.   NM MYOVIEW LTD  11/2016   6.6 METS.  Reached 106% of max.  Heart rate.  Walk for 4:40 min.  EF 72%.  Normal blood pressure response.  upsloping ST segment depression, nonspecific.  Otherwise normal study.  LOW  RISK.  No evidence of ischemia or infarction.   TRANSTHORACIC ECHOCARDIOGRAM  11/2021   Normal LV Size & Fxn - EF 60-65%. No RWMA. ~ Diastolic Fxn. Degenerative MV - Mod MAC. ~ Mod AS    Social History   Socioeconomic History   Marital status: Married    Spouse name: Not on file   Number of children: 3   Years of education: Not on file   Highest education level: Master's degree (e.g., MA, MS, MEng, MEd, MSW, MBA)  Occupational History    Comment: retired Pharmacist, hospital, principal x 21 years  Tobacco Use   Smoking status: Never    Passive exposure: Never   Smokeless tobacco: Never  Vaping Use   Vaping Use: Never used  Substance and Sexual Activity   Alcohol use: No   Drug use: No   Sexual activity: Yes    Birth control/protection: Surgical    Comment: Hysterectomy  Other Topics Concern   Not on file  Social History Narrative   She is a married mother of 29, grandmother of 44, great grandmother of 1. She walks daily about 2 miles a day, does not smoke, does not drink.    Lives with huband   Social Determinants of Health   Financial Resource Strain: Not on file  Food Insecurity: Not on file  Transportation Needs: Not on file  Physical Activity: Not on file  Stress: Not on file  Social Connections: Not on file  Intimate Partner Violence: Not on file     Allergies  Allergen Reactions   Atorvastatin Other (See Comments)    Unknown reaction   Desmopressin Acetate Other (See Comments)   Iodinated Contrast Media Hives and Other (See Comments)   Myrbetriq [Mirabegron] Other (See Comments)    "Makes me urinate more frequently"   Iodine Rash     Outpatient Medications Prior to Visit  Medication Sig Dispense Refill   acetaminophen (TYLENOL) 500 MG tablet Take 500-1,000 mg by mouth every 8 (eight) hours as needed for mild pain.      albuterol (VENTOLIN HFA) 108 (90 Base) MCG/ACT inhaler Inhale 2 puffs into the lungs every 4 (four) hours as needed for wheezing or shortness of  breath.      aspirin EC 81 MG tablet Take 1 tablet (81 mg total) by mouth every evening. Swallow whole. 30 tablet 11   Cholecalciferol (VITAMIN D-3) 25 MCG (1000 UT) CAPS Take 1,000 Units by mouth daily with breakfast.      famotidine (PEPCID) 20 MG tablet Take 40 mg by mouth daily.     fluticasone-salmeterol (WIXELA INHUB) 100-50 MCG/ACT AEPB Inhale 1 puff into the lungs 2 (two) times daily. 1 each 11   guaiFENesin (MUCINEX) 600 MG 12 hr tablet Take 1 tablet (600 mg total) by mouth 2 (two)  times daily. 30 tablet 0   Lactobacillus Rhamnosus, GG, (CULTURELLE) CAPS Take 1 capsule by mouth every morning.      Magnesium 250 MG TABS Take 250 mg by mouth daily as needed (leg cramps).     metoprolol succinate (TOPROL-XL) 25 MG 24 hr tablet Take 1 tablet (25 mg total) by mouth every evening. Kapspargo sprinkle 90 tablet 3   Mirabegron (MYRBETRIQ PO) Take 1 tablet by mouth at bedtime.     Multiple Vitamins-Minerals (AIRBORNE PO) Take 1 tablet by mouth daily with breakfast.     Multiple Vitamins-Minerals (CENTRUM SILVER ULTRA WOMENS) TABS Take 1 tablet by mouth daily with breakfast.      Multiple Vitamins-Minerals (ICAPS AREDS 2 PO) Take 1 capsule by mouth daily at 12 noon.     Nutritional Supplements (,FEEDING SUPPLEMENT, PROSOURCE PLUS) liquid Take 30 mLs by mouth 2 (two) times daily between meals. 887 mL 0   protein supplement shake (PREMIER PROTEIN) LIQD Take 59.1 mLs (2 oz total) by mouth 3 (three) times daily between meals. 10000 mL 0   Trospium Chloride 60 MG CP24 Take 1 capsule by mouth daily.     vitamin B-12 (CYANOCOBALAMIN) 100 MCG tablet Take 100 mcg by mouth daily.     No facility-administered medications prior to visit.       Objective:   Physical Exam:  General appearance: 89 y.o., female, NAD, conversant  Eyes: anicteric sclerae; PERRL, tracking appropriately HENT: NCAT; MMM Neck: Trachea midline; no lymphadenopathy, no JVD Lungs: CTAB, no crackles, no wheeze, with normal  respiratory effort CV: RRR, + systolic murmur Abdomen: Soft, non-tender; non-distended, BS present  Extremities: No peripheral edema, warm Skin: Normal turgor and texture; no rash Psych: Appropriate affect Neuro: Alert and oriented to person and place, no focal deficit     There were no vitals filed for this visit.     on RA BMI Readings from Last 3 Encounters:  03/07/22 22.64 kg/m  01/24/22 22.14 kg/m  01/19/22 22.07 kg/m   Wt Readings from Last 3 Encounters:  03/07/22 127 lb 12.8 oz (58 kg)  01/24/22 125 lb (56.7 kg)  01/19/22 124 lb 9.6 oz (56.5 kg)     CBC    Component Value Date/Time   WBC 9.5 10/22/2021 0046   RBC 4.05 10/22/2021 0046   HGB 12.4 10/22/2021 0046   HCT 33.2 (L) 10/22/2021 0046   PLT 172 10/22/2021 0046   MCV 82.0 10/22/2021 0046   MCV 87.1 03/25/2015 1405   MCH 30.6 10/22/2021 0046   MCHC 37.3 (H) 10/22/2021 0046   RDW 13.1 10/22/2021 0046   LYMPHSABS 4.4 (H) 10/22/2021 0046   MONOABS 0.7 10/22/2021 0046   EOSABS 0.1 10/22/2021 0046   BASOSABS 0.0 10/22/2021 0046     Chest Imaging: CT Coronaries 12/13/21 a few foci of scar at bases  Barium swallow 10/21/21: 1.  Severe esophageal dysmotility.   2. Pulsion diverticulum of lower esophagus just above gastroesophageal junction.   3.  Inability to swallow pill beyond the pharynx.  Pulmonary Functions Testing Results:     No data to display            Echocardiogram 12/15/21:    1. Left ventricular ejection fraction, by estimation, is 60 to 65%. The  left ventricle has normal function. The left ventricle has no regional  wall motion abnormalities. There is mild left ventricular hypertrophy.  Left ventricular diastolic parameters  are indeterminate.   2. Right ventricular systolic function is normal. The right  ventricular  size is normal.   3. The mitral valve is degenerative. Mild mitral valve regurgitation. No  evidence of mitral stenosis. Moderate mitral annular  calcification.   4. Gradients slightly higher than seen on TTE 04/08/21 . The aortic valve  is normal in structure. Aortic valve regurgitation is mild. Moderate  aortic valve stenosis.   5. The inferior vena cava is normal in size with greater than 50%  respiratory variability, suggesting right atrial pressure of 3 mmHg.       Assessment & Plan:   # DOE  # Recurrent chest tightness/pressure, presyncope Tough to piece together her constellation of symptoms precisely. Potentially multifactorial with deconditioning, moderate AS, esophageal spasm, reactive airways following covid-19 infection, dysautonomia among considerations  Plan: - try spiriva 2 puffs once daily everyday - technique demonstrated in clinic. Wait to use wixela inhaler until I see you again in clinic - she worries about risk of thrush despite reassurance it could be easily treated and inhaler discontinued if she had it.  - albuterol 1-2 puffs as needed and can use 5-20 minutes before your walk to see if it helps - see you in 4 weeks or sooner if need be!     Maryjane Hurter, MD [facility] 04/01/2022 5:15 PM

## 2022-04-04 ENCOUNTER — Ambulatory Visit: Payer: Medicare PPO | Admitting: Student

## 2022-04-04 ENCOUNTER — Encounter: Payer: Self-pay | Admitting: Student

## 2022-04-04 VITALS — BP 122/70 | HR 69 | Ht 63.0 in | Wt 124.8 lb

## 2022-04-04 DIAGNOSIS — R0600 Dyspnea, unspecified: Secondary | ICD-10-CM

## 2022-04-04 NOTE — Patient Instructions (Signed)
-   when your old spiriva inhaler runs out, switch to higher dose spiriva 2 puffs once daily. I'll check with pharmacy to see what this will cost - continue albuterol 1-2 puffs as needed up to 4 times a day as a RESCUE inhaler only

## 2022-04-05 ENCOUNTER — Encounter (INDEPENDENT_AMBULATORY_CARE_PROVIDER_SITE_OTHER): Payer: Medicare PPO | Admitting: Ophthalmology

## 2022-04-05 DIAGNOSIS — I10 Essential (primary) hypertension: Secondary | ICD-10-CM

## 2022-04-05 DIAGNOSIS — H353231 Exudative age-related macular degeneration, bilateral, with active choroidal neovascularization: Secondary | ICD-10-CM | POA: Diagnosis not present

## 2022-04-05 DIAGNOSIS — H35033 Hypertensive retinopathy, bilateral: Secondary | ICD-10-CM | POA: Diagnosis not present

## 2022-04-05 DIAGNOSIS — H43813 Vitreous degeneration, bilateral: Secondary | ICD-10-CM | POA: Diagnosis not present

## 2022-04-14 DIAGNOSIS — M65331 Trigger finger, right middle finger: Secondary | ICD-10-CM | POA: Diagnosis not present

## 2022-04-25 ENCOUNTER — Telehealth: Payer: Self-pay | Admitting: Cardiology

## 2022-04-25 NOTE — Telephone Encounter (Signed)
Spoke to patient . Appointment  schedule for &/10/24 at 1:30 pm  Patient verbalized understanding.

## 2022-04-25 NOTE — Telephone Encounter (Signed)
Patient is requesting call back to request appt with Dr. Herbie Baltimore at the [facility] office in July. She states that she is not interested in being put on wait list or being scheduled with APP. She states that she would like a call back from nurse to see if there are other options for her. Please advise.

## 2022-04-25 NOTE — Telephone Encounter (Signed)
Spoke with patient and she stated she called to schedule appointment for 6 month follow up in July.  He is currently booked out until October.  Jasmine December can you look at July for [facility] and see if I can use one of those spots. If so I can call patient back to schedule.

## 2022-05-04 ENCOUNTER — Ambulatory Visit: Payer: Medicare PPO | Admitting: Student

## 2022-05-08 ENCOUNTER — Telehealth: Payer: Self-pay | Admitting: Student

## 2022-05-08 NOTE — Telephone Encounter (Signed)
What will spiriva respimat cost her?   Thanks!

## 2022-05-08 NOTE — Progress Notes (Unsigned)
Synopsis: Referred for DOE by Thana Ates, MD  Subjective:   PATIENT ID: [name] GENDER: female DOB: 10-24-1927, MRN: [id number]  No chief complaint on file.  CC: dyspnea, chest tightness  89yF history of DVT, TIA, CKD3, mod AS, diastolic dysfunction  Recent admission for CP, dypsnea, presyncope vs vertigo 10/22/21 found to have severe esophageal dysmotility  Seen by cardiology 11/6 and echo (mod AS), zio patch (overall pretty low burden of SVT), coronary CTA (mild nCAD), vestibular rehab ordreed  Saw GI 12/13 started on H2B, TLC measures discussed  Short of breath over the past year with minimal exertion. Mentions recurrent episodes of presyncope that happen when she gets suddenly dyspneic. This has all been since she had covid-19 2021. She does think it's quite helpful when she uses albuterol.  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 She never smoked, no vaping, MJ.   Interval HPI  Last visit started on spiriva - planned to try wixela if no response  Some improvement in her DOE with use of low dose spiriva, using daily 2 puffs. Infrequent use of albuterol.  ------------------------- Increased dose of spiriva last visit  Otherwise pertinent review of systems is negative.  Past Medical History:  Diagnosis Date   Abnormal Pap smear 1975-76   Acute deep vein thrombosis (DVT) of left peroneal vein (HCC)    Anemia    history of   Arthritis    spine   Breast cyst, left 1980   Cystocele 2012   Diverticulosis    with h/o Diverticulitis   DVT, lower extremity, distal, chronic (HCC) 12/24/2018   GERD (gastroesophageal reflux disease)    H/O hypercholesterolemia    H/O osteopenia    H/O varicella    H/O varicose veins    Hx of Mumps    Macular degeneration    Seasonal allergies    TIA (transient ischemic attack)    Urge incontinence 2012   Urinary frequency 2010     Family History   Problem Relation Age of Onset   Uterine cancer Mother 78   Heart attack Brother 39   Breast cancer Daughter    Stroke Neg Hx      Past Surgical History:  Procedure Laterality Date   14-Day Zio Patch  12/2021   Predominantly sinus rhythm open and 47-1:15 PM, average 70 bpm (1  AVB); rare isolated PACs PVCs.  55 atrial runs 4-28 beats fastest heart rate ranges 169 to 176 bpm.  Episodes lasting 1.8 to 15 seconds.  No sustained arrhythmias.  Symptoms not noted with atrial runs.   ABDOMINAL HYSTERECTOMY     BLADDER SUSPENSION N/A 06/16/2016   Procedure: TRANSVAGINAL TAPE (TVT) PROCEDURE;  Surgeon: Osborn Coho, MD;  Location: [facility];  Service: Gynecology;  Laterality: N/A;   BREAST CYST ASPIRATION  1964   COLONOSCOPY     Dr. Kinnie Scales   CT CTA CORONARY W/CA SCORE W/CM &/OR WO/CM  11/2021   1. Mild mixed non-obstructive CAD, CADRADS = 2.   2. Coronary calcium score of 255. This was 73rd percentile for age and sex matched control (based on an 89 yo).  3. Normal coronary origin with right dominance.   4. Aortic and mitral annular calcification.  5. Aortic atherosclerosis   CYSTOCELE REPAIR N/A 06/16/2016   Procedure: ANTERIOR REPAIR (CYSTOCELE);  Surgeon: Osborn Coho, MD;  Location: [facility];  Service: Gynecology;  Laterality:  N/A;   CYSTOSCOPY N/A 06/16/2016   Procedure: CYSTOSCOPY;  Surgeon: Osborn Coho, MD;  Location: [facility];  Service: Gynecology;  Laterality: N/A;  see anterior repair   Lower Extremity Venous Dopplers  01/09/2012   Right and left lower steroids: No evidence of thrombus or, thrombophlebitis; right and left GSV and SSV: No venous insufficiency. Normal exam.   NM MYOVIEW LTD  11/2016   6.6 METS.  Reached 106% of max.  Heart rate.  Walk for 4:40 min.  EF 72%.  Normal blood pressure response.  upsloping ST segment depression, nonspecific.  Otherwise normal study.  LOW RISK.  No evidence of ischemia or infarction.   TRANSTHORACIC ECHOCARDIOGRAM  11/2021   Normal LV Size &  Fxn - EF 60-65%. No RWMA. ~ Diastolic Fxn. Degenerative MV - Mod MAC. ~ Mod AS    Social History   Socioeconomic History   Marital status: Married    Spouse name: Not on file   Number of children: 3   Years of education: Not on file   Highest education level: Master's degree (e.g., MA, MS, MEng, MEd, MSW, MBA)  Occupational History    Comment: retired Runner, broadcasting/film/video, principal x 21 years  Tobacco Use   Smoking status: Never    Passive exposure: Never   Smokeless tobacco: Never  Vaping Use   Vaping Use: Never used  Substance and Sexual Activity   Alcohol use: No   Drug use: No   Sexual activity: Yes    Birth control/protection: Surgical    Comment: Hysterectomy  Other Topics Concern   Not on file  Social History Narrative   She is a married mother of 3, grandmother of 6, great grandmother of 1. She walks daily about 2 miles a day, does not smoke, does not drink.    Lives with huband   Social Determinants of Health   Financial Resource Strain: Not on file  Food Insecurity: Not on file  Transportation Needs: Not on file  Physical Activity: Not on file  Stress: Not on file  Social Connections: Not on file  Intimate Partner Violence: Not on file     Allergies  Allergen Reactions   Atorvastatin Other (See Comments)    Unknown reaction   Desmopressin Acetate Other (See Comments)   Iodinated Contrast Media Hives and Other (See Comments)   Myrbetriq [Mirabegron] Other (See Comments)    "Makes me urinate more frequently"   Iodine Rash     Outpatient Medications Prior to Visit  Medication Sig Dispense Refill   acetaminophen (TYLENOL) 500 MG tablet Take 500-1,000 mg by mouth every 8 (eight) hours as needed for mild pain.      albuterol (VENTOLIN HFA) 108 (90 Base) MCG/ACT inhaler Inhale 2 puffs into the lungs every 4 (four) hours as needed for wheezing or shortness of breath.      aspirin EC 81 MG tablet Take 1 tablet (81 mg total) by mouth every evening. Swallow whole. 30  tablet 11   Cholecalciferol (VITAMIN D-3) 25 MCG (1000 UT) CAPS Take 1,000 Units by mouth daily with breakfast.      famotidine (PEPCID) 20 MG tablet Take 40 mg by mouth daily.     fluticasone-salmeterol (WIXELA INHUB) 100-50 MCG/ACT AEPB Inhale 1 puff into the lungs 2 (two) times daily. 1 each 11   guaiFENesin (MUCINEX) 600 MG 12 hr tablet Take 1 tablet (600 mg total) by mouth 2 (two) times daily. 30 tablet 0   Lactobacillus Rhamnosus, GG, (CULTURELLE) CAPS Take  1 capsule by mouth every morning.      Magnesium 250 MG TABS Take 250 mg by mouth daily as needed (leg cramps).     metoprolol succinate (TOPROL-XL) 25 MG 24 hr tablet Take 1 tablet (25 mg total) by mouth every evening. Kapspargo sprinkle 90 tablet 3   Mirabegron (MYRBETRIQ PO) Take 1 tablet by mouth at bedtime.     Multiple Vitamins-Minerals (AIRBORNE PO) Take 1 tablet by mouth daily with breakfast.     Multiple Vitamins-Minerals (CENTRUM SILVER ULTRA WOMENS) TABS Take 1 tablet by mouth daily with breakfast.      Multiple Vitamins-Minerals (ICAPS AREDS 2 PO) Take 1 capsule by mouth daily at 12 noon.     Nutritional Supplements (,FEEDING SUPPLEMENT, PROSOURCE PLUS) liquid Take 30 mLs by mouth 2 (two) times daily between meals. 887 mL 0   protein supplement shake (PREMIER PROTEIN) LIQD Take 59.1 mLs (2 oz total) by mouth 3 (three) times daily between meals. (Patient taking differently: Take 2 oz by mouth 3 (three) times daily between meals. BOOST) 10000 mL 0   Trospium Chloride 60 MG CP24 Take 1 capsule by mouth daily.     vitamin B-12 (CYANOCOBALAMIN) 100 MCG tablet Take 100 mcg by mouth daily.     No facility-administered medications prior to visit.       Objective:   Physical Exam:  General appearance: 89 y.o., female, NAD, conversant  Eyes: anicteric sclerae; PERRL, tracking appropriately HENT: NCAT; MMM Neck: Trachea midline; no lymphadenopathy, no JVD Lungs: CTAB, no crackles, no wheeze, with normal respiratory  effort CV: RRR, + systolic murmur Abdomen: Soft, non-tender; non-distended, BS present  Extremities: No peripheral edema, warm Skin: Normal turgor and texture; no rash Psych: Appropriate affect Neuro: Alert and oriented to person and place, no focal deficit     There were no vitals filed for this visit.      on RA BMI Readings from Last 3 Encounters:  04/04/22 22.11 kg/m  03/07/22 22.64 kg/m  01/24/22 22.14 kg/m   Wt Readings from Last 3 Encounters:  04/04/22 124 lb 12.8 oz (56.6 kg)  03/07/22 127 lb 12.8 oz (58 kg)  01/24/22 125 lb (56.7 kg)     CBC    Component Value Date/Time   WBC 9.5 10/22/2021 0046   RBC 4.05 10/22/2021 0046   HGB 12.4 10/22/2021 0046   HCT 33.2 (L) 10/22/2021 0046   PLT 172 10/22/2021 0046   MCV 82.0 10/22/2021 0046   MCV 87.1 03/25/2015 1405   MCH 30.6 10/22/2021 0046   MCHC 37.3 (H) 10/22/2021 0046   RDW 13.1 10/22/2021 0046   LYMPHSABS 4.4 (H) 10/22/2021 0046   MONOABS 0.7 10/22/2021 0046   EOSABS 0.1 10/22/2021 0046   BASOSABS 0.0 10/22/2021 0046     Chest Imaging: CT Coronaries 12/13/21 a few foci of scar at bases  Barium swallow 10/21/21: 1.  Severe esophageal dysmotility.   2. Pulsion diverticulum of lower esophagus just above gastroesophageal junction.   3.  Inability to swallow pill beyond the pharynx.  Pulmonary Functions Testing Results:     No data to display            Echocardiogram 12/15/21:    1. Left ventricular ejection fraction, by estimation, is 60 to 65%. The  left ventricle has normal function. The left ventricle has no regional  wall motion abnormalities. There is mild left ventricular hypertrophy.  Left ventricular diastolic parameters  are indeterminate.   2. Right ventricular systolic function is  normal. The right ventricular  size is normal.   3. The mitral valve is degenerative. Mild mitral valve regurgitation. No  evidence of mitral stenosis. Moderate mitral annular calcification.    4. Gradients slightly higher than seen on TTE 04/08/21 . The aortic valve  is normal in structure. Aortic valve regurgitation is mild. Moderate  aortic valve stenosis.   5. The inferior vena cava is normal in size with greater than 50%  respiratory variability, suggesting right atrial pressure of 3 mmHg.       Assessment & Plan:   # DOE  # Recurrent chest tightness/pressure, presyncope Tough to piece together her constellation of symptoms precisely. Potentially multifactorial with deconditioning, moderate AS, esophageal spasm, reactive airways following covid-19 infection, dysautonomia among considerations though she has had some improvement with empiric use of LAMA.  Plan: - when your old spiriva inhaler runs out, switch to higher dose spiriva 2 puffs once daily. I'll check with pharmacy to see what this will cost. Wait to use wixela inhaler until seen again in clinic - she worries about risk of thrush despite reassurance it could be easily treated and inhaler discontinued if she had it.  - albuterol 1-2 puffs as needed and can use 5-20 minutes before your walk to see if it helps - see you in 4 weeks or sooner if need be!     Omar Person, MD [facility] 05/08/2022 6:53 PM

## 2022-05-09 ENCOUNTER — Encounter: Payer: Self-pay | Admitting: Student

## 2022-05-09 ENCOUNTER — Ambulatory Visit: Payer: Medicare PPO | Admitting: Student

## 2022-05-09 VITALS — BP 98/60 | HR 70 | Ht 63.0 in | Wt 125.4 lb

## 2022-05-09 DIAGNOSIS — R0600 Dyspnea, unspecified: Secondary | ICD-10-CM | POA: Diagnosis not present

## 2022-05-09 MED ORDER — SPIRIVA RESPIMAT 1.25 MCG/ACT IN AERS: 2.0000 | INHALATION_SPRAY | Freq: Every day | RESPIRATORY_TRACT | 0 refills | Status: DC

## 2022-05-09 NOTE — Patient Instructions (Addendum)
-   would start using spiriva 2 puffs everyday - continue albuterol 1-2 puffs as needed up to 4 times a day as a RESCUE inhaler only - discuss metoprolol dose with cardiology if blood pressure continues to be on low side especially if you're also feeling kind of lightheaded

## 2022-05-10 ENCOUNTER — Encounter (INDEPENDENT_AMBULATORY_CARE_PROVIDER_SITE_OTHER): Payer: Medicare PPO | Admitting: Ophthalmology

## 2022-05-10 DIAGNOSIS — H43813 Vitreous degeneration, bilateral: Secondary | ICD-10-CM | POA: Diagnosis not present

## 2022-05-10 DIAGNOSIS — I10 Essential (primary) hypertension: Secondary | ICD-10-CM

## 2022-05-10 DIAGNOSIS — H35033 Hypertensive retinopathy, bilateral: Secondary | ICD-10-CM | POA: Diagnosis not present

## 2022-05-10 DIAGNOSIS — H353231 Exudative age-related macular degeneration, bilateral, with active choroidal neovascularization: Secondary | ICD-10-CM

## 2022-05-11 ENCOUNTER — Other Ambulatory Visit (HOSPITAL_COMMUNITY): Payer: Self-pay

## 2022-05-11 DIAGNOSIS — N952 Postmenopausal atrophic vaginitis: Secondary | ICD-10-CM | POA: Diagnosis not present

## 2022-05-11 NOTE — Telephone Encounter (Signed)
Test claim shows a co-pay of $40.00 for Spiriva Respimat

## 2022-05-12 MED ORDER — SPIRIVA RESPIMAT 1.25 MCG/ACT IN AERS: 2.0000 | INHALATION_SPRAY | Freq: Every day | RESPIRATORY_TRACT | 11 refills | Status: AC

## 2022-06-14 ENCOUNTER — Encounter (INDEPENDENT_AMBULATORY_CARE_PROVIDER_SITE_OTHER): Payer: Medicare PPO | Admitting: Ophthalmology

## 2022-06-14 DIAGNOSIS — H43813 Vitreous degeneration, bilateral: Secondary | ICD-10-CM | POA: Diagnosis not present

## 2022-06-14 DIAGNOSIS — H353231 Exudative age-related macular degeneration, bilateral, with active choroidal neovascularization: Secondary | ICD-10-CM

## 2022-06-14 DIAGNOSIS — I10 Essential (primary) hypertension: Secondary | ICD-10-CM

## 2022-06-14 DIAGNOSIS — H35033 Hypertensive retinopathy, bilateral: Secondary | ICD-10-CM | POA: Diagnosis not present

## 2022-06-22 ENCOUNTER — Emergency Department (HOSPITAL_COMMUNITY): Payer: Medicare PPO

## 2022-06-22 ENCOUNTER — Other Ambulatory Visit: Payer: Self-pay

## 2022-06-22 ENCOUNTER — Inpatient Hospital Stay (HOSPITAL_COMMUNITY)
Admission: EM | Admit: 2022-06-22 | Discharge: 2022-06-25 | DRG: 312 | Disposition: A | Discharge status: Home Health | Payer: Medicare PPO | Attending: Internal Medicine | Admitting: Internal Medicine

## 2022-06-22 ENCOUNTER — Observation Stay (HOSPITAL_COMMUNITY): Payer: Medicare PPO

## 2022-06-22 DIAGNOSIS — I10 Essential (primary) hypertension: Secondary | ICD-10-CM | POA: Diagnosis not present

## 2022-06-22 DIAGNOSIS — Z8673 Personal history of transient ischemic attack (TIA), and cerebral infarction without residual deficits: Secondary | ICD-10-CM

## 2022-06-22 DIAGNOSIS — W19XXXA Unspecified fall, initial encounter: Secondary | ICD-10-CM | POA: Diagnosis not present

## 2022-06-22 DIAGNOSIS — R7989 Other specified abnormal findings of blood chemistry: Secondary | ICD-10-CM

## 2022-06-22 DIAGNOSIS — M8580 Other specified disorders of bone density and structure, unspecified site: Secondary | ICD-10-CM | POA: Diagnosis present

## 2022-06-22 DIAGNOSIS — S0282XA Fracture of other specified skull and facial bones, left side, initial encounter for closed fracture: Secondary | ICD-10-CM | POA: Diagnosis present

## 2022-06-22 DIAGNOSIS — S0240DA Maxillary fracture, left side, initial encounter for closed fracture: Secondary | ICD-10-CM | POA: Diagnosis not present

## 2022-06-22 DIAGNOSIS — S0240FA Zygomatic fracture, left side, initial encounter for closed fracture: Secondary | ICD-10-CM | POA: Diagnosis present

## 2022-06-22 DIAGNOSIS — Z79899 Other long term (current) drug therapy: Secondary | ICD-10-CM

## 2022-06-22 DIAGNOSIS — W1830XA Fall on same level, unspecified, initial encounter: Secondary | ICD-10-CM | POA: Diagnosis present

## 2022-06-22 DIAGNOSIS — S0285XA Fracture of orbit, unspecified, initial encounter for closed fracture: Secondary | ICD-10-CM | POA: Diagnosis present

## 2022-06-22 DIAGNOSIS — R339 Retention of urine, unspecified: Secondary | ICD-10-CM | POA: Diagnosis present

## 2022-06-22 DIAGNOSIS — Z7982 Long term (current) use of aspirin: Secondary | ICD-10-CM

## 2022-06-22 DIAGNOSIS — J302 Other seasonal allergic rhinitis: Secondary | ICD-10-CM | POA: Diagnosis present

## 2022-06-22 DIAGNOSIS — K219 Gastro-esophageal reflux disease without esophagitis: Secondary | ICD-10-CM | POA: Diagnosis present

## 2022-06-22 DIAGNOSIS — M25551 Pain in right hip: Secondary | ICD-10-CM | POA: Diagnosis not present

## 2022-06-22 DIAGNOSIS — M79642 Pain in left hand: Secondary | ICD-10-CM | POA: Diagnosis not present

## 2022-06-22 DIAGNOSIS — Z888 Allergy status to other drugs, medicaments and biological substances status: Secondary | ICD-10-CM

## 2022-06-22 DIAGNOSIS — I951 Orthostatic hypotension: Secondary | ICD-10-CM | POA: Diagnosis present

## 2022-06-22 DIAGNOSIS — Z91041 Radiographic dye allergy status: Secondary | ICD-10-CM

## 2022-06-22 DIAGNOSIS — I2510 Atherosclerotic heart disease of native coronary artery without angina pectoris: Secondary | ICD-10-CM | POA: Diagnosis present

## 2022-06-22 DIAGNOSIS — S52509A Unspecified fracture of the lower end of unspecified radius, initial encounter for closed fracture: Secondary | ICD-10-CM | POA: Diagnosis not present

## 2022-06-22 DIAGNOSIS — R609 Edema, unspecified: Secondary | ICD-10-CM | POA: Diagnosis not present

## 2022-06-22 DIAGNOSIS — N179 Acute kidney failure, unspecified: Secondary | ICD-10-CM | POA: Diagnosis present

## 2022-06-22 DIAGNOSIS — S0280XA Fracture of other specified skull and facial bones, unspecified side, initial encounter for closed fracture: Secondary | ICD-10-CM | POA: Diagnosis not present

## 2022-06-22 DIAGNOSIS — I82409 Acute embolism and thrombosis of unspecified deep veins of unspecified lower extremity: Secondary | ICD-10-CM | POA: Diagnosis not present

## 2022-06-22 DIAGNOSIS — Z87440 Personal history of urinary (tract) infections: Secondary | ICD-10-CM

## 2022-06-22 DIAGNOSIS — E860 Dehydration: Secondary | ICD-10-CM | POA: Diagnosis present

## 2022-06-22 DIAGNOSIS — S52511A Displaced fracture of right radial styloid process, initial encounter for closed fracture: Secondary | ICD-10-CM | POA: Diagnosis present

## 2022-06-22 DIAGNOSIS — R000 Tachycardia, unspecified: Secondary | ICD-10-CM | POA: Diagnosis not present

## 2022-06-22 DIAGNOSIS — I82452 Acute embolism and thrombosis of left peroneal vein: Secondary | ICD-10-CM | POA: Diagnosis present

## 2022-06-22 DIAGNOSIS — M79641 Pain in right hand: Secondary | ICD-10-CM | POA: Diagnosis not present

## 2022-06-22 DIAGNOSIS — S0990XA Unspecified injury of head, initial encounter: Secondary | ICD-10-CM | POA: Diagnosis not present

## 2022-06-22 DIAGNOSIS — I080 Rheumatic disorders of both mitral and aortic valves: Secondary | ICD-10-CM | POA: Diagnosis present

## 2022-06-22 DIAGNOSIS — Z8249 Family history of ischemic heart disease and other diseases of the circulatory system: Secondary | ICD-10-CM | POA: Diagnosis not present

## 2022-06-22 DIAGNOSIS — S199XXA Unspecified injury of neck, initial encounter: Secondary | ICD-10-CM | POA: Diagnosis not present

## 2022-06-22 DIAGNOSIS — R6884 Jaw pain: Secondary | ICD-10-CM | POA: Diagnosis present

## 2022-06-22 DIAGNOSIS — S52391A Other fracture of shaft of radius, right arm, initial encounter for closed fracture: Secondary | ICD-10-CM | POA: Diagnosis not present

## 2022-06-22 DIAGNOSIS — Z1152 Encounter for screening for COVID-19: Secondary | ICD-10-CM

## 2022-06-22 DIAGNOSIS — R0689 Other abnormalities of breathing: Secondary | ICD-10-CM | POA: Diagnosis not present

## 2022-06-22 DIAGNOSIS — Z90710 Acquired absence of both cervix and uterus: Secondary | ICD-10-CM

## 2022-06-22 DIAGNOSIS — R55 Syncope and collapse: Secondary | ICD-10-CM | POA: Diagnosis not present

## 2022-06-22 DIAGNOSIS — E7800 Pure hypercholesterolemia, unspecified: Secondary | ICD-10-CM | POA: Diagnosis present

## 2022-06-22 DIAGNOSIS — R338 Other retention of urine: Secondary | ICD-10-CM

## 2022-06-22 DIAGNOSIS — S52502A Unspecified fracture of the lower end of left radius, initial encounter for closed fracture: Secondary | ICD-10-CM | POA: Diagnosis present

## 2022-06-22 DIAGNOSIS — H3530 Unspecified macular degeneration: Secondary | ICD-10-CM | POA: Diagnosis present

## 2022-06-22 DIAGNOSIS — N1832 Chronic kidney disease, stage 3b: Secondary | ICD-10-CM

## 2022-06-22 DIAGNOSIS — I129 Hypertensive chronic kidney disease with stage 1 through stage 4 chronic kidney disease, or unspecified chronic kidney disease: Secondary | ICD-10-CM | POA: Diagnosis present

## 2022-06-22 DIAGNOSIS — Z8619 Personal history of other infectious and parasitic diseases: Secondary | ICD-10-CM

## 2022-06-22 DIAGNOSIS — S52514A Nondisplaced fracture of right radial styloid process, initial encounter for closed fracture: Secondary | ICD-10-CM | POA: Diagnosis not present

## 2022-06-22 DIAGNOSIS — S0232XA Fracture of orbital floor, left side, initial encounter for closed fracture: Secondary | ICD-10-CM | POA: Diagnosis not present

## 2022-06-22 LAB — TROPONIN I (HIGH SENSITIVITY)
Troponin I (High Sensitivity): 15 ng/L (ref <18)
Troponin I (High Sensitivity): 18 ng/L — ABNORMAL HIGH (ref <18)

## 2022-06-22 LAB — URINALYSIS, ROUTINE W REFLEX MICROSCOPIC
Bacteria, UA: NONE SEEN
Bilirubin Urine: NEGATIVE
Glucose, UA: NEGATIVE mg/dL
Hgb urine dipstick: NEGATIVE
Ketones, ur: 5 mg/dL — AB
Leukocytes,Ua: NEGATIVE
Nitrite: NEGATIVE
Protein, ur: 30 mg/dL — AB
Specific Gravity, Urine: 1.014 (ref 1.005–1.030)
pH: 6 (ref 5.0–8.0)

## 2022-06-22 LAB — CBC
HCT: 36.9 % (ref 36.0–46.0)
Hemoglobin: 13.4 g/dL (ref 12.0–15.0)
MCH: 30 pg (ref 26.0–34.0)
MCHC: 36.3 g/dL — ABNORMAL HIGH (ref 30.0–36.0)
MCV: 82.7 fL (ref 80.0–100.0)
Platelets: 184 10*3/uL (ref 150–400)
RBC: 4.46 MIL/uL (ref 3.87–5.11)
RDW: 13.1 % (ref 11.5–15.5)
WBC: 10.1 10*3/uL (ref 4.0–10.5)
nRBC: 0 % (ref 0.0–0.2)

## 2022-06-22 LAB — BASIC METABOLIC PANEL
Anion gap: 13 (ref 5–15)
BUN: 23 mg/dL (ref 8–23)
CO2: 22 mmol/L (ref 22–32)
Calcium: 9.4 mg/dL (ref 8.9–10.3)
Chloride: 98 mmol/L (ref 98–111)
Creatinine, Ser: 1.27 mg/dL — ABNORMAL HIGH (ref 0.44–1.00)
GFR, Estimated: 40 mL/min — ABNORMAL LOW (ref >60)
Glucose, Bld: 140 mg/dL — ABNORMAL HIGH (ref 70–99)
Potassium: 4.1 mmol/L (ref 3.5–5.1)
Sodium: 133 mmol/L — ABNORMAL LOW (ref 135–145)

## 2022-06-22 LAB — CBG MONITORING, ED: Glucose-Capillary: 135 mg/dL — ABNORMAL HIGH (ref 70–99)

## 2022-06-22 MED ORDER — ONDANSETRON HCL 4 MG/2ML IJ SOLN: 4.0000 mg | Freq: Four times a day (QID) | INTRAMUSCULAR | Status: DC | PRN

## 2022-06-22 MED ORDER — ACETAMINOPHEN 325 MG PO TABS: 650.0000 mg | ORAL_TABLET | Freq: Four times a day (QID) | ORAL | Status: DC | PRN

## 2022-06-22 MED ORDER — HYDROCODONE-ACETAMINOPHEN 5-325 MG PO TABS
1.0000 | ORAL_TABLET | ORAL | Status: DC | PRN
  Administered 2022-06-23: 1 via ORAL
  Administered 2022-06-23: 2 via ORAL
  Administered 2022-06-23 – 2022-06-24 (×5): 1 via ORAL
  Filled 2022-06-22 (×4): qty 1
  Filled 2022-06-22 (×2): qty 2
  Filled 2022-06-22: qty 1

## 2022-06-22 MED ORDER — ONDANSETRON HCL 4 MG/2ML IJ SOLN
4.0000 mg | Freq: Once | INTRAMUSCULAR | Status: AC
  Administered 2022-06-22: 4 mg via INTRAVENOUS
  Filled 2022-06-22: qty 2

## 2022-06-22 MED ORDER — SODIUM CHLORIDE 0.9 % IV BOLUS
1000.0000 mL | Freq: Once | INTRAVENOUS | Status: AC
  Administered 2022-06-22: 1000 mL via INTRAVENOUS

## 2022-06-22 MED ORDER — HYDROMORPHONE HCL 1 MG/ML IJ SOLN
0.5000 mg | INTRAMUSCULAR | Status: AC | PRN
  Administered 2022-06-22 – 2022-06-24 (×3): 0.5 mg via INTRAVENOUS
  Filled 2022-06-22 (×3): qty 0.5

## 2022-06-22 MED ORDER — SODIUM CHLORIDE 0.9% FLUSH
3.0000 mL | Freq: Two times a day (BID) | INTRAVENOUS | Status: DC
  Administered 2022-06-23 – 2022-06-25 (×3): 3 mL via INTRAVENOUS

## 2022-06-22 MED ORDER — FENTANYL CITRATE PF 50 MCG/ML IJ SOSY
50.0000 ug | PREFILLED_SYRINGE | Freq: Once | INTRAMUSCULAR | Status: AC
  Administered 2022-06-22: 50 ug via INTRAVENOUS
  Filled 2022-06-22: qty 1

## 2022-06-22 MED ORDER — ONDANSETRON HCL 4 MG PO TABS: 4.0000 mg | ORAL_TABLET | Freq: Four times a day (QID) | ORAL | Status: DC | PRN

## 2022-06-22 MED ORDER — ACETAMINOPHEN 650 MG RE SUPP: 650.0000 mg | Freq: Four times a day (QID) | RECTAL | Status: DC | PRN

## 2022-06-22 MED ORDER — MORPHINE SULFATE (PF) 4 MG/ML IV SOLN
4.0000 mg | Freq: Once | INTRAVENOUS | Status: AC
  Administered 2022-06-22: 4 mg via INTRAVENOUS
  Filled 2022-06-22: qty 1

## 2022-06-22 MED ORDER — SENNOSIDES-DOCUSATE SODIUM 8.6-50 MG PO TABS: 1.0000 | ORAL_TABLET | Freq: Every evening | ORAL | Status: DC | PRN

## 2022-06-22 NOTE — H&P (Signed)
History and Physical    [name]:[id number] DOB: 1927/08/13 DOA: 06/22/2022  PCP: Thana Ates, MD  Patient coming from: Home  I have personally briefly reviewed patient's old medical records in [facility]  Chief Complaint: Syncope, facial and right hand injury  HPI: [name] is a 89 y.o. female with medical history significant for CKD stage IIIb, moderate aortic stenosis, HTN, HLD, TIA, esophageal dysmotility who presented to the ED for evaluation after a syncopal event at home resulting in multiple injuries.  Patient states she was preparing breakfast in her kitchen earlier today.  She felt "faint" and lightheaded.  She was walking to her living room when she suddenly lost consciousness and fell.  She does not recall having any other symptoms when she fell.  This was an unwitnessed fall.  Her husband found her down on the ground.  She had swelling and pain to the left side of her face as well as pain to her right wrist.  She denies any chest pain, dyspnea, neck pain, nausea, vomiting, diarrhea.  ED Course  Labs/Imaging on admission: I have personally reviewed following labs and imaging studies.  Initial vitals showed BP 183/93, pulse 95, RR 20, temp 97.7 F, SpO2 100% on room air.  Labs show WBC 10.1, hemoglobin 13.4, platelets 184,000, sodium 133, potassium 4.1, bicarb 22, BUN 23, creatinine 1.27, serum glucose 140, troponin 15 > 18.  UA negative for UTI.  Right wrist x-ray showed nondisplaced distal radial fracture involving the base of the radial styloid.  Fracture line extends to the radiocarpal joint.  CT head without contrast negative for acute intracranial normalities.  Left facial fractures with air-fluid level in the left maxillary antrum setting.  CT maxillofacial without contrast: IMPRESSION: 1. Multiple acute and depressed/displaced left orbital and facial fractures as discussed, including comminuted zygomatic arch fractures extending to the  temporomandibular joint surface, a lateral orbital fracture with displaced fragment causing medial displacement of the lateral rectus muscle, and orbital/maxillary antral fractures extending to the pterygoid canal with gas in the canal. 2. Associated posttraumatic air-fluid level in the left maxillary antrum. 1. Soft tissue contusion over the left side of the face and zygomatic region.  Right hip x-ray shows degenerative changes in the right hip without acute displaced fractures.  1 view chest x-ray shows linear atelectasis or fibrosis in the lung bases.  No evidence of active pulmonary disease.  Patient was given 1 L normal saline, IV morphine 4 mg, IV fentanyl 50 mcg.  Splint to right arm placed.  ENT, Dr. Jearld Fenton, was consulted and will see the patient in the morning.  Orthopedics Dr. Carola Frost was consulted and will see the patient in AM.  Trauma surgery, Dr. Freida Busman, consulted and has evaluated patient.  Recommended further imaging with CT cervical spine and medical admission for syncope workup.  The hospitalist service was consulted to admit for further evaluation and management.  Review of Systems: All systems reviewed and are negative except as documented in history of present illness above.   Past Medical History:  Diagnosis Date   Abnormal Pap smear 1975-76   Acute deep vein thrombosis (DVT) of left peroneal vein (HCC)    Anemia    history of   Arthritis    spine   Breast cyst, left 1980   Cystocele 2012   Diverticulosis    with h/o Diverticulitis   DVT, lower extremity, distal, chronic (HCC) 12/24/2018   GERD (gastroesophageal reflux disease)    H/O  hypercholesterolemia    H/O osteopenia    H/O varicella    H/O varicose veins    Hx of Mumps    Macular degeneration    Seasonal allergies    TIA (transient ischemic attack)    Urge incontinence 2012   Urinary frequency 2010    Past Surgical History:  Procedure Laterality Date   14-Day Zio Patch  12/2021   Predominantly  sinus rhythm open and 47-1:15 PM, average 70 bpm (1  AVB); rare isolated PACs PVCs.  55 atrial runs 4-28 beats fastest heart rate ranges 169 to 176 bpm.  Episodes lasting 1.8 to 15 seconds.  No sustained arrhythmias.  Symptoms not noted with atrial runs.   ABDOMINAL HYSTERECTOMY     BLADDER SUSPENSION N/A 06/16/2016   Procedure: TRANSVAGINAL TAPE (TVT) PROCEDURE;  Surgeon: Osborn Coho, MD;  Location: [facility];  Service: Gynecology;  Laterality: N/A;   BREAST CYST ASPIRATION  1964   COLONOSCOPY     Dr. Kinnie Scales   CT CTA CORONARY W/CA SCORE W/CM &/OR WO/CM  11/2021   1. Mild mixed non-obstructive CAD, CADRADS = 2.   2. Coronary calcium score of 255. This was 73rd percentile for age and sex matched control (based on an 89 yo).  3. Normal coronary origin with right dominance.   4. Aortic and mitral annular calcification.  5. Aortic atherosclerosis   CYSTOCELE REPAIR N/A 06/16/2016   Procedure: ANTERIOR REPAIR (CYSTOCELE);  Surgeon: Osborn Coho, MD;  Location: [facility];  Service: Gynecology;  Laterality: N/A;   CYSTOSCOPY N/A 06/16/2016   Procedure: CYSTOSCOPY;  Surgeon: Osborn Coho, MD;  Location: [facility];  Service: Gynecology;  Laterality: N/A;  see anterior repair   Lower Extremity Venous Dopplers  01/09/2012   Right and left lower steroids: No evidence of thrombus or, thrombophlebitis; right and left GSV and SSV: No venous insufficiency. Normal exam.   NM MYOVIEW LTD  11/2016   6.6 METS.  Reached 106% of max.  Heart rate.  Walk for 4:40 min.  EF 72%.  Normal blood pressure response.  upsloping ST segment depression, nonspecific.  Otherwise normal study.  LOW RISK.  No evidence of ischemia or infarction.   TRANSTHORACIC ECHOCARDIOGRAM  11/2021   Normal LV Size & Fxn - EF 60-65%. No RWMA. ~ Diastolic Fxn. Degenerative MV - Mod MAC. ~ Mod AS    Social History:  reports that she has never smoked. She has never been exposed to tobacco smoke. She has never used smokeless tobacco. She reports  that she does not drink alcohol and does not use drugs.  Allergies  Allergen Reactions   Atorvastatin Other (See Comments)    Unknown reaction   Desmopressin Acetate Other (See Comments)   Iodinated Contrast Media Hives and Other (See Comments)   Myrbetriq [Mirabegron] Other (See Comments)    "Makes me urinate more frequently"   Iodine Rash    Family History  Problem Relation Age of Onset   Uterine cancer Mother 63   Heart attack Brother 47   Breast cancer Daughter    Stroke Neg Hx      Prior to Admission medications   Medication Sig Start Date End Date Taking? Authorizing Provider  acetaminophen (TYLENOL) 500 MG tablet Take 500-1,000 mg by mouth every 8 (eight) hours as needed for mild pain.     [provider]  albuterol (VENTOLIN HFA) 108 (90 Base) MCG/ACT inhaler Inhale 2 puffs into the lungs every 4 (four) hours as needed for wheezing or  shortness of breath.  01/30/19   [provider]  aspirin EC 81 MG tablet Take 1 tablet (81 mg total) by mouth every evening. Swallow whole. 01/24/22   Marykay Lex, MD  Cholecalciferol (VITAMIN D-3) 25 MCG (1000 UT) CAPS Take 1,000 Units by mouth daily with breakfast.     [provider]  Lactobacillus Rhamnosus, GG, (CULTURELLE) CAPS Take 1 capsule by mouth every morning.     [provider]  Magnesium 250 MG TABS Take 250 mg by mouth daily as needed (leg cramps).    [provider]  metoprolol succinate (TOPROL-XL) 25 MG 24 hr tablet Take 1 tablet (25 mg total) by mouth every evening. Kapspargo sprinkle 01/24/22   Marykay Lex, MD  Mirabegron (MYRBETRIQ PO) Take 1 tablet by mouth at bedtime.    [provider]  Multiple Vitamins-Minerals (AIRBORNE PO) Take 1 tablet by mouth daily with breakfast.    [provider]  Multiple Vitamins-Minerals (CENTRUM SILVER ULTRA WOMENS) TABS Take 1 tablet by mouth daily with breakfast.     [provider]  Multiple Vitamins-Minerals  (ICAPS AREDS 2 PO) Take 1 capsule by mouth daily at 12 noon.    [provider]  Nutritional Supplements (,FEEDING SUPPLEMENT, PROSOURCE PLUS) liquid Take 30 mLs by mouth 2 (two) times daily between meals. 10/22/21   Rolly Salter, MD  protein supplement shake (PREMIER PROTEIN) LIQD Take 59.1 mLs (2 oz total) by mouth 3 (three) times daily between meals. Patient taking differently: Take 2 oz by mouth 3 (three) times daily between meals. BOOST 10/22/21   Rolly Salter, MD  Tiotropium Bromide Monohydrate (SPIRIVA RESPIMAT) 1.25 MCG/ACT AERS Inhale 2 puffs into the lungs daily. 05/09/22   Omar Person, MD  Tiotropium Bromide Monohydrate (SPIRIVA RESPIMAT) 1.25 MCG/ACT AERS Inhale 2 puffs into the lungs daily. 05/12/22   Omar Person, MD  Trospium Chloride 60 MG CP24 Take 1 capsule by mouth daily. 03/18/21   [provider]  vitamin B-12 (CYANOCOBALAMIN) 100 MCG tablet Take 100 mcg by mouth daily.    [provider]    Physical Exam: Vitals:   06/22/22 2200 06/22/22 2300 06/22/22 2314 06/22/22 2336  BP: (!) 142/74 (!) 143/84  (!) 162/87  Pulse: 99 (!) 103  98  Resp: 13 11  20   Temp:   (!) 97.5 F (36.4 C) 97.7 F (36.5 C)  TempSrc:   Oral Oral  SpO2: 95% 96%  96%  Weight:      Height:       Constitutional: Resting in bed, NAD, calm, comfortable Eyes: PERRL, swelling and bruising below the left eye ENMT: Mucous membranes are moist. Posterior pharynx clear of any exudate or lesions.Normal dentition.  Neck: normal, supple, no masses. Respiratory: clear to auscultation bilaterally, no wheezing, no crackles. Normal respiratory effort. No accessory muscle use.  Cardiovascular: Regular rate and rhythm, systolic murmur present. No extremity edema. 2+ pedal pulses. Abdomen: no tenderness, no masses palpated.  Musculoskeletal: no clubbing / cyanosis. No joint deformity upper and lower extremities. Good ROM, no contractures. Normal muscle tone.  Skin: no rashes,  lesions, ulcers. No induration Neurologic: Sensation intact. Strength 5/5 in all 4.  Psychiatric: Normal judgment and insight. Alert and oriented x 3. Normal mood.   EKG: Personally reviewed. Sinus rhythm, rate 91, biatrial enlargement, RBBB.  Rate is faster when compared to prior.  Assessment/Plan Principal Problem:   Syncope Active Problems:   Essential hypertension   Fracture of other  specified skull and facial bones, left side, initial encounter for closed fracture (HCC)   Distal radial fracture   Chronic kidney disease, stage 3b (HCC)   Elevated troponin   Acute urinary retention   [name] is a 89 y.o. female with medical history significant for CKD stage IIIb, moderate aortic stenosis, HTN, HLD, TIA, esophageal dysmotility who is admitted for syncope eval with multiple facial fractures and right wrist fracture.  Assessment and Plan: Syncope: Unclear etiology resulting in multiple left facial fractures as well as right radial fracture. -Keep on telemetry -Obtain echocardiogram -Orthostatic vitals -PT/OT eval  Left comminuted zygomatic arch fracture Left lateral orbital fracture Orbital/maxillary antral fractures: ENT Dr. Jearld Fenton to consult in AM.  Trauma surgery, Dr. Freida Busman, consulted.  CT spine ordered to rule out cervical spine fractures.  Distal right radial fracture: Splint placed in ED.  Orthopedics Dr. Carola Frost to consult in AM.  Elevated troponin: Minimal elevation.  Patient denies any chest pain.  EKG without acute ischemic changes.  Keep on telemetry and repeat troponin level.  Obtaining echocardiogram as above.  CKD stage IIIb: Renal function relatively stable.  Continue to monitor.  Urinary retention: Foley catheter placed in the ED. UA negative for UTI.  Hypertension: Continue Toprol-XL.    DVT prophylaxis: SCDs Start: 06/22/22 2251 Code Status: Full code, confirmed with patient on admission Family Communication: Discussed with patient, she has  discussed with family Disposition Plan: From home, dispo pending clinical progress Consults called: ENT (Dr. Jearld Fenton), orthopedics (Dr. Carola Frost), trauma surgery (Dr. Freida Busman) Severity of Illness: The appropriate patient status for this patient is OBSERVATION. Observation status is judged to be reasonable and necessary in order to provide the required intensity of service to ensure the patient's safety. The patient's presenting symptoms, physical exam findings, and initial radiographic and laboratory data in the context of their medical condition is felt to place them at decreased risk for further clinical deterioration. Furthermore, it is anticipated that the patient will be medically stable for discharge from the hospital within 2 midnights of admission.   Catherine Mclean MD [facility]  If 7PM-7AM, please contact night-coverage [email or web address]  06/23/2022, 12:05 AM

## 2022-06-22 NOTE — Progress Notes (Signed)
Orthopedic Tech Progress Note Patient Details:  [name] 09-11-1927 [id number]  Ortho Devices Type of Ortho Device: Sugartong splint Ortho Device/Splint Location: RUE Ortho Device/Splint Interventions: Ordered, Application, Adjustment   Post Interventions Patient Tolerated: Well Instructions Provided: Care of device, Adjustment of device Splint applied to my best ability, patients had was contracted and they kept moving their arm throughout the application. Darleen Crocker 06/22/2022, 8:43 PM

## 2022-06-22 NOTE — ED Provider Notes (Signed)
[place] EMERGENCY DEPARTMENT AT [facility] Provider Note   CSN: [id number] Arrival date & time: 06/22/22  1536     History {Add pertinent medical, surgical, social history, OB history to HPI:1} Chief Complaint  Patient presents with   [name] is a 89 y.o. female.  Patient is a 89 yo female presenting from home after syncopal episode resulting in witnessed fall. Patient denies hx of chest pain or sob prior to syncope. Admit to dizziness prior to syncope otherwise no memory of episode. No witnessed seizure like activity. Patient admits to hx of cough. Denies fevers, chills, nausea, vomiting, abdominal pain, or dysuria. Admits to left sided facial pain, left sided jaw pain, right sided wrist pain, and right sided hip pain after fall. Denies neck or back pain.   The history is provided by the patient and the spouse. No language interpreter was used.  Fall Pertinent negatives include no chest pain, no abdominal pain and no shortness of breath.       Home Medications Prior to Admission medications   Medication Sig Start Date End Date Taking? Authorizing Provider  acetaminophen (TYLENOL) 500 MG tablet Take 500-1,000 mg by mouth every 8 (eight) hours as needed for mild pain.     [provider]  albuterol (VENTOLIN HFA) 108 (90 Base) MCG/ACT inhaler Inhale 2 puffs into the lungs every 4 (four) hours as needed for wheezing or shortness of breath.  01/30/19   [provider]  aspirin EC 81 MG tablet Take 1 tablet (81 mg total) by mouth every evening. Swallow whole. 01/24/22   Marykay Lex, MD  Cholecalciferol (VITAMIN D-3) 25 MCG (1000 UT) CAPS Take 1,000 Units by mouth daily with breakfast.     [provider]  Lactobacillus Rhamnosus, GG, (CULTURELLE) CAPS Take 1 capsule by mouth every morning.     [provider]  Magnesium 250 MG TABS Take 250 mg by mouth daily as needed (leg cramps).    [provider]  metoprolol  succinate (TOPROL-XL) 25 MG 24 hr tablet Take 1 tablet (25 mg total) by mouth every evening. Kapspargo sprinkle 01/24/22   Marykay Lex, MD  Mirabegron (MYRBETRIQ PO) Take 1 tablet by mouth at bedtime.    [provider]  Multiple Vitamins-Minerals (AIRBORNE PO) Take 1 tablet by mouth daily with breakfast.    [provider]  Multiple Vitamins-Minerals (CENTRUM SILVER ULTRA WOMENS) TABS Take 1 tablet by mouth daily with breakfast.     [provider]  Multiple Vitamins-Minerals (ICAPS AREDS 2 PO) Take 1 capsule by mouth daily at 12 noon.    [provider]  Nutritional Supplements (,FEEDING SUPPLEMENT, PROSOURCE PLUS) liquid Take 30 mLs by mouth 2 (two) times daily between meals. 10/22/21   Rolly Salter, MD  protein supplement shake (PREMIER PROTEIN) LIQD Take 59.1 mLs (2 oz total) by mouth 3 (three) times daily between meals. Patient taking differently: Take 2 oz by mouth 3 (three) times daily between meals. BOOST 10/22/21   Rolly Salter, MD  Tiotropium Bromide Monohydrate (SPIRIVA RESPIMAT) 1.25 MCG/ACT AERS Inhale 2 puffs into the lungs daily. 05/09/22   Omar Person, MD  Tiotropium Bromide Monohydrate (SPIRIVA RESPIMAT) 1.25 MCG/ACT AERS Inhale 2 puffs into the lungs daily. 05/12/22   Omar Person, MD  Trospium Chloride 60 MG CP24 Take 1 capsule by mouth daily. 03/18/21   [provider]  vitamin B-12 (CYANOCOBALAMIN) 100 MCG tablet Take 100  mcg by mouth daily.    [provider]      Allergies    Atorvastatin, Desmopressin acetate, Iodinated contrast media, Myrbetriq [mirabegron], and Iodine    Review of Systems   Review of Systems  Constitutional:  Negative for chills and fever.  HENT:  Negative for ear pain and sore throat.   Eyes:  Negative for pain and visual disturbance.  Respiratory:  Negative for cough and shortness of breath.   Cardiovascular:  Negative for chest pain and palpitations.  Gastrointestinal:   Negative for abdominal pain and vomiting.  Genitourinary:  Negative for dysuria and hematuria.  Musculoskeletal:  Negative for arthralgias and back pain.  Skin:  Positive for wound. Negative for color change and rash.  Neurological:  Positive for syncope. Negative for seizures.  All other systems reviewed and are negative.   Physical Exam Updated Vital Signs BP (!) 163/91 (BP Location: Right Arm)   Pulse (!) 108   Temp 98.4 F (36.9 C) (Oral)   Resp 18   Ht 5\' 3"  (1.6 m)   Wt 56.7 kg   SpO2 98%   BMI 22.14 kg/m  Physical Exam Vitals and nursing note reviewed.  Constitutional:      General: She is not in acute distress.    Appearance: She is well-developed.  HENT:     Head: Normocephalic. Contusion present.   Eyes:     Conjunctiva/sclera: Conjunctivae normal.  Cardiovascular:     Rate and Rhythm: Normal rate and regular rhythm.     Heart sounds: No murmur heard. Pulmonary:     Effort: Pulmonary effort is normal. No respiratory distress.     Breath sounds: Normal breath sounds.  Abdominal:     Palpations: Abdomen is soft.     Tenderness: There is no abdominal tenderness.  Musculoskeletal:        General: No swelling.       Arms:     Cervical back: Normal and neck supple.     Thoracic back: Normal.     Lumbar back: Normal.     Right hip: Bony tenderness present.  Skin:    General: Skin is warm and dry.     Capillary Refill: Capillary refill takes less than 2 seconds.  Neurological:     Mental Status: She is alert and oriented to person, place, and time.     GCS: GCS eye subscore is 4. GCS verbal subscore is 5. GCS motor subscore is 6.     Cranial Nerves: Cranial nerves 2-12 are intact.     Sensory: Sensation is intact.     Motor: Motor function is intact.     Coordination: Coordination is intact.  Psychiatric:        Mood and Affect: Mood normal.     ED Results / Procedures / Treatments   Labs (all labs ordered are listed, but only abnormal results are  displayed) Labs Reviewed  BASIC METABOLIC PANEL - Abnormal; Notable for the following components:      Result Value   Sodium 133 (*)    Glucose, Bld 140 (*)    Creatinine, Ser 1.27 (*)    GFR, Estimated 40 (*)    All other components within normal limits  CBC - Abnormal; Notable for the following components:   MCHC 36.3 (*)    All other components within normal limits  URINALYSIS, ROUTINE W REFLEX MICROSCOPIC - Abnormal; Notable for the following components:   Ketones, ur 5 (*)    Protein,  ur 30 (*)    All other components within normal limits  CBG MONITORING, ED - Abnormal; Notable for the following components:   Glucose-Capillary 135 (*)    All other components within normal limits  TROPONIN I (HIGH SENSITIVITY) - Abnormal; Notable for the following components:   Troponin I (High Sensitivity) 18 (*)    All other components within normal limits  CBG MONITORING, ED  TROPONIN I (HIGH SENSITIVITY)    EKG None  Radiology DG Chest 1 View  Result Date: 06/22/2022 CLINICAL DATA:  Syncopal episode and fall today. EXAM: CHEST  1 VIEW COMPARISON:  10/18/2021 FINDINGS: Heart size and pulmonary vascularity are normal. Linear fibrosis or atelectasis in the lung bases, similar to prior study. No airspace disease or consolidation. No pleural effusions. No pneumothorax. Mediastinal contours appear intact. Calcification of the aorta. Degenerative changes in the spine and shoulders. IMPRESSION: Linear atelectasis or fibrosis in the lung bases. No evidence of active pulmonary disease. Electronically Signed   By: Burman Nieves M.D.   On: 06/22/2022 17:58   DG Hip Unilat W or Wo Pelvis 2-3 Views Right  Result Date: 06/22/2022 CLINICAL DATA:  Right hip pain after syncopal episode and fall today. EXAM: DG HIP (WITH OR WITHOUT PELVIS) 2-3V RIGHT COMPARISON:  None Available. FINDINGS: Mild degenerative changes in the hips. Pelvis and right hip appear intact. No evidence of acute fracture or dislocation.  No focal bone lesion or bone destruction. SI joints and symphysis pubis are not displaced. IMPRESSION: Degenerative changes in the right hip. No acute displaced fractures identified. Electronically Signed   By: Burman Nieves M.D.   On: 06/22/2022 17:57   CT Maxillofacial Wo Contrast  Result Date: 06/22/2022 CLINICAL DATA:  Blunt facial trauma with left zygomatic arch pain and ecchymosis. Left jaw pain. Syncope or presyncope. EXAM: CT MAXILLOFACIAL WITHOUT CONTRAST TECHNIQUE: Multidetector CT imaging of the maxillofacial structures was performed. Multiplanar CT image reconstructions were also generated. RADIATION DOSE REDUCTION: This exam was performed according to the departmental dose-optimization program which includes automated exposure control, adjustment of the mA and/or kV according to patient size and/or use of iterative reconstruction technique. COMPARISON:  CT head 06/01/2021 FINDINGS: Osseous: Acute comminuted and depressed fractures of the zygomatic arch with fracture lines extending to the temporomandibular joint surface. Acute comminuted and depressed fractures of the lateral left orbital wall with displacement of a tiny fragment into the extraconal intraorbital space, partially displacing the rectus lateralis muscle. Mildly depressed fracture of the posterior aspect of the inferior orbital wall with gas demonstrated in the pterygoid canal. Comminuted and depressed fractures of the lateral left maxillary antral wall extending to the upper maxilla. The mandible appears intact. Degenerative changes in the temporomandibular joints. Orbits: The globes and extraocular muscles appear intact and symmetrical. Medial displacement of the left lateral rectus muscle as above. Sinuses: Air-fluid level in the left maxillary antrum. Soft tissues: Soft tissue infiltration consistent with contusion or hematoma in the lateral aspect of the left orbital and facial region centered mostly around the zygomatic arch  fractures. Limited intracranial: See additional report of CT head same date. IMPRESSION: 1. Multiple acute and depressed/displaced left orbital and facial fractures as discussed, including comminuted zygomatic arch fractures extending to the temporomandibular joint surface, a lateral orbital fracture with displaced fragment causing medial displacement of the lateral rectus muscle, and orbital/maxillary antral fractures extending to the pterygoid canal with gas in the canal. 2. Associated posttraumatic air-fluid level in the left maxillary antrum. 1. Soft tissue contusion  over the left side of the face and zygomatic region. Electronically Signed   By: Burman Nieves M.D.   On: 06/22/2022 17:56   CT HEAD WO CONTRAST  Result Date: 06/22/2022 CLINICAL DATA:  Syncope or presyncope with cerebrovascular cause suspected. Facial trauma. Left jaw pain. EXAM: CT HEAD WITHOUT CONTRAST TECHNIQUE: Contiguous axial images were obtained from the base of the skull through the vertex without intravenous contrast. RADIATION DOSE REDUCTION: This exam was performed according to the departmental dose-optimization program which includes automated exposure control, adjustment of the mA and/or kV according to patient size and/or use of iterative reconstruction technique. COMPARISON:  MRI brain 10/19/2021.  CT head 06/01/2021 FINDINGS: Brain: Diffuse cerebral atrophy. Ventricular dilatation consistent with central atrophy. Low-attenuation changes in the deep white matter consistent with small vessel ischemia. No abnormal extra-axial fluid collections. No mass effect or midline shift. Kura Bethards-white matter junctions are distinct. Basal cisterns are not effaced. No acute intracranial hemorrhage. Vascular: No hyperdense vessel or unexpected calcification. Skull: Normal. Negative for fracture or focal lesion. Sinuses/Orbits: Air-fluid level in the left maxillary antrum with multiple left facial fractures. See additional report of CT  maxillofacial. Mastoid air cells are clear. Other: None. IMPRESSION: 1. No acute intracranial abnormalities. Chronic atrophy and small vessel ischemic changes. 2. Left facial fractures with air-fluid level in the left maxillary antrum. See additional report of CT maxillofacial same date. Electronically Signed   By: Burman Nieves M.D.   On: 06/22/2022 17:37   DG Wrist Complete Right  Result Date: 06/22/2022 CLINICAL DATA:  Pain after fall EXAM: RIGHT WRIST - COMPLETE 4 VIEW COMPARISON:  None Available. FINDINGS: Nondisplaced fracture of the extreme distal radius involving the radiocarpal joint and the radial styloid. No additional fracture or dislocation. Osteopenia. Chondrocalcinosis. Soft tissue swelling. IMPRESSION: Nondisplaced distal radial fracture involving the base of the radial styloid. Fracture line extends to the radiocarpal joint. Chondrocalcinosis and soft tissue swelling. Electronically Signed   By: Karen Kays M.D.   On: 06/22/2022 16:11    Procedures Procedures  {Document cardiac monitor, telemetry assessment procedure when appropriate:1}  Medications Ordered in ED Medications  ondansetron (ZOFRAN) injection 4 mg (4 mg Intravenous Given 06/22/22 1634)  fentaNYL (SUBLIMAZE) injection 50 mcg (50 mcg Intravenous Given 06/22/22 1633)  morphine (PF) 4 MG/ML injection 4 mg (4 mg Intravenous Given 06/22/22 1940)  sodium chloride 0.9 % bolus 1,000 mL (1,000 mLs Intravenous New Bag/Given 06/22/22 2025)    ED Course/ Medical Decision Making/ A&P   {   Click here for ABCD2, HEART and other calculatorsREFRESH Note before signing :1}                          Medical Decision Making Amount and/or Complexity of Data Reviewed Labs: ordered. Radiology: ordered.  Risk Prescription drug management. Decision regarding hospitalization.    89 yo female presenting from home after syncopal episode resulting in witnessed fall. Patient denies hx of chest pain or sob prior to syncope.  She is alert  and oriented x 3, in distress due to pain, facial contusions on physical exam.  Fentanyl 50 mcg given for pain.  Call from nurse.  Pain still uncontrolled at this time.  Morphine 4 mg given.  CT imaging pertinent for left zygomatic arch fracture, orbit fractures, maxillary fractures.  Vision intact.  I spoke with ENT specialist Dr. Jearld Fenton was on-call for maxillofacial trauma.  Will see patient in the morning.  Patient also has right radial fracture.  Splint ordered.  Ortho on consult.  Recommending admission for syncope and continued cardiac workup with consults to teams discussed for trauma.  Dr. Carola Frost with ortho will see patient. Trauma requesting to admit to hospitalist team.  Patient accepted by admitting team.   AKI on laboratory studies likely secondary to dehydration.  Family states the patient has not been eating or drinking much over the last 2 to 3 days and has been feeling faint.  Possibly resulting in fall.  1 L fluid bolus given.  Urinalysis demonstrates no urinary tract infection.  Patient is not able to urinate on her own.  States she normally has an overactive bladder.  Foley catheter inserted.  400 cc output.  {Document critical care time when appropriate:1} {Document review of labs and clinical decision tools ie heart score, Chads2Vasc2 etc:1}  {Document your independent review of radiology images, and any outside records:1} {Document your discussion with family members, caretakers, and with consultants:1} {Document social determinants of health affecting pt's care:1} {Document your decision making why or why not admission, treatments were needed:1} Final Clinical Impression(s) / ED Diagnoses Final diagnoses:  Closed fracture of distal end of left radius, unspecified fracture morphology, initial encounter  Fall, initial encounter  Injury of head, initial encounter  Syncope, unspecified syncope type  Closed fracture of left orbit, initial encounter (HCC)  Closed fracture of  left zygomatic arch, initial encounter (HCC)  AKI (acute kidney injury) (HCC)  Urinary retention    Rx / DC Orders ED Discharge Orders     None

## 2022-06-22 NOTE — ED Notes (Signed)
Patient requesting pain medication. Reports no improvement with previous dose

## 2022-06-22 NOTE — Progress Notes (Signed)
   06/22/22 2336  Vitals  Temp 97.7 F (36.5 C)  Temp Source Oral  BP (!) 162/87  MAP (mmHg) 98  BP Location Left Arm  BP Method Automatic  Patient Position (if appropriate) Lying  Pulse Rate 98  Pulse Rate Source Monitor  ECG Heart Rate 98  Resp 20  Level of Consciousness  Level of Consciousness Alert  Oxygen Therapy  SpO2 96 %  O2 Device Room Air  MEWS Score  MEWS Temp 0  MEWS Systolic 0  MEWS Pulse 0  MEWS RR 0  MEWS LOC 0  MEWS Score 0  MEWS Score Color Green   Received ED admission to 4E-14, patient is alert oriented and has pain complaint of 8/10. Performed CHG bath, took vitals, skin assessment. Pt. Resting in bed with call bell in reach.

## 2022-06-22 NOTE — Progress Notes (Signed)
Consult request received for right radial styloid fracture. Have discussed with the requesting MD patient's stability, pain control, and ongoing work up. I have reviewed x-rays and developed a provisional plan.   Full consultation to follow in the am. If patient discharge prior to being seen then follow up in the office next Wednesday. Will convert from current sugertong to thumb spica cast or removable brace as appropriate in the days/ weeks ahead.  Myrene Galas, MD [facility], [facility] [phone]

## 2022-06-22 NOTE — TOC CAGE-AID Note (Signed)
Transition of Care [facility]) - CAGE-AID Screening   Patient Details  Name: GERVAISE MEGGETT MRN: [id number] Date of Birth: [birth year]  [facility] CM/SW Contact:    Leota Sauers, RN Phone Number: 06/22/2022, 10:03 PM   Clinical Narrative:  Denies use of alcohol and drugs. Education not offered at this time.  CAGE-AID Screening:    Have You Ever Felt You Ought to Cut Down on Your Drinking or Drug Use?: No Have People Annoyed You By Critizing Your Drinking Or Drug Use?: No Have You Felt Bad Or Guilty About Your Drinking Or Drug Use?: No Have You Ever Had a Drink or Used Drugs First Thing In The Morning to Steady Your Nerves or to Get Rid of a Hangover?: No CAGE-AID Score: 0  Substance Abuse Education Offered: No

## 2022-06-22 NOTE — Hospital Course (Signed)
[name] is a 89 y.o. female with medical history significant for CKD stage IIIb, moderate aortic stenosis, HTN, HLD, TIA, esophageal dysmotility who is admitted for syncope eval with multiple facial fractures and right wrist fracture.

## 2022-06-22 NOTE — Consult Note (Signed)
[name] 1927-08-28  [id number].    Requesting MD: Dr. Edwin Dada Chief Complaint/Reason for Consult: trauma  HPI:  [name] is a 89 yo female who presented to the ED after a syncopal event. She was making breakfast this morning and began to feel light-headed and faint. She tried to go to the couch to lay down but passed out and fell on the floor. She is not sure if she hit anything when she fell. She has facial pain and swelling and pain in the left wrist. She denies chest pain, neck pain, and abdominal pain. CT head/maxillofacial showed multiple left sided facial fractures. She also has a distal right radius fracture. Trauma surgery was consulted. The patient denies prior syncopal events. She takes a baby aspirin at home but no other blood thinners. She lives at home with her husband.  ROS: Review of Systems  Constitutional:  Negative for chills and fever.  HENT:  Negative for sore throat.        Jaw pain  Respiratory:  Negative for shortness of breath and stridor.   Cardiovascular:  Negative for chest pain.  Gastrointestinal:  Negative for abdominal pain.  Musculoskeletal:  Negative for back pain.       R wrist pain  Neurological:  Positive for dizziness and loss of consciousness.    Family History  Problem Relation Age of Onset   Uterine cancer Mother 40   Heart attack Brother 40   Breast cancer Daughter    Stroke Neg Hx     Past Medical History:  Diagnosis Date   Abnormal Pap smear 1975-76   Acute deep vein thrombosis (DVT) of left peroneal vein (HCC)    Anemia    history of   Arthritis    spine   Breast cyst, left 1980   Cystocele 2012   Diverticulosis    with h/o Diverticulitis   DVT, lower extremity, distal, chronic (HCC) 12/24/2018   GERD (gastroesophageal reflux disease)    H/O hypercholesterolemia    H/O osteopenia    H/O varicella    H/O varicose veins    Hx of Mumps    Macular degeneration    Seasonal allergies    TIA (transient ischemic  attack)    Urge incontinence 2012   Urinary frequency 2010    Past Surgical History:  Procedure Laterality Date   14-Day Zio Patch  12/2021   Predominantly sinus rhythm open and 47-1:15 PM, average 70 bpm (1  AVB); rare isolated PACs PVCs.  55 atrial runs 4-28 beats fastest heart rate ranges 169 to 176 bpm.  Episodes lasting 1.8 to 15 seconds.  No sustained arrhythmias.  Symptoms not noted with atrial runs.   ABDOMINAL HYSTERECTOMY     BLADDER SUSPENSION N/A 06/16/2016   Procedure: TRANSVAGINAL TAPE (TVT) PROCEDURE;  Surgeon: Osborn Coho, MD;  Location: [facility];  Service: Gynecology;  Laterality: N/A;   BREAST CYST ASPIRATION  1964   COLONOSCOPY     Dr. Kinnie Scales   CT CTA CORONARY W/CA SCORE W/CM &/OR WO/CM  11/2021   1. Mild mixed non-obstructive CAD, CADRADS = 2.   2. Coronary calcium score of 255. This was 73rd percentile for age and sex matched control (based on an 89 yo).  3. Normal coronary origin with right dominance.   4. Aortic and mitral annular calcification.  5. Aortic atherosclerosis   CYSTOCELE REPAIR N/A 06/16/2016   Procedure: ANTERIOR REPAIR (CYSTOCELE);  Surgeon: Osborn Coho, MD;  Location: [facility];  Service: Gynecology;  Laterality: N/A;   CYSTOSCOPY N/A 06/16/2016   Procedure: CYSTOSCOPY;  Surgeon: Osborn Coho, MD;  Location: [facility];  Service: Gynecology;  Laterality: N/A;  see anterior repair   Lower Extremity Venous Dopplers  01/09/2012   Right and left lower steroids: No evidence of thrombus or, thrombophlebitis; right and left GSV and SSV: No venous insufficiency. Normal exam.   NM MYOVIEW LTD  11/2016   6.6 METS.  Reached 106% of max.  Heart rate.  Walk for 4:40 min.  EF 72%.  Normal blood pressure response.  upsloping ST segment depression, nonspecific.  Otherwise normal study.  LOW RISK.  No evidence of ischemia or infarction.   TRANSTHORACIC ECHOCARDIOGRAM  11/2021   Normal LV Size & Fxn - EF 60-65%. No RWMA. ~ Diastolic Fxn. Degenerative MV - Mod MAC. ~  Mod AS    Social History:  reports that she has never smoked. She has never been exposed to tobacco smoke. She has never used smokeless tobacco. She reports that she does not drink alcohol and does not use drugs.  Allergies:  Allergies  Allergen Reactions   Atorvastatin Other (See Comments)    Unknown reaction   Desmopressin Acetate Other (See Comments)   Iodinated Contrast Media Hives and Other (See Comments)   Myrbetriq [Mirabegron] Other (See Comments)    "Makes me urinate more frequently"   Iodine Rash    (Not in a hospital admission)    Physical Exam: Blood pressure (!) 149/74, pulse 99, temperature 98.4 F (36.9 C), temperature source Oral, resp. rate 16, height 5\' 3"  (1.6 m), weight 56.7 kg, SpO2 97 %. General: resting comfortably, appears stated age, no apparent distress Neurological: alert and oriented, no focal deficits, cranial nerves grossly in tact HEENT: left periorbital ecchymosis with mild swelling CV: regular rate and rhythm, extremities warm and well-perfused Respiratory: normal work of breathing on room air, symmetric chest wall expansion, no chest wall deformities or ecchymoses Abdomen: soft, nondistended, nontender to deep palpation. No masses or organomegaly. Extremities: warm and well-perfused,splint in place RUE Psychiatric: normal mood and affect Skin: warm and dry, no jaundice, no rashes or lesions   Results for orders placed or performed during the hospital encounter of 06/22/22 (from the past 48 hour(s))  Basic metabolic panel     Status: Abnormal   Collection Time: 06/22/22  3:44 PM  Result Value Ref Range   Sodium 133 (L) 135 - 145 mmol/L   Potassium 4.1 3.5 - 5.1 mmol/L   Chloride 98 98 - 111 mmol/L   CO2 22 22 - 32 mmol/L   Glucose, Bld 140 (H) 70 - 99 mg/dL    Comment: Glucose reference range applies only to samples taken after fasting for at least 8 hours.   BUN 23 8 - 23 mg/dL   Creatinine, Ser 1.61 (H) 0.44 - 1.00 mg/dL   Calcium 9.4  8.9 - 09.6 mg/dL   GFR, Estimated 40 (L) >60 mL/min    Comment: (NOTE) Calculated using the CKD-EPI Creatinine Equation (2021)    Anion gap 13 5 - 15    Comment: Performed at [facility], [address], [place], [place] [address]  CBC     Status: Abnormal   Collection Time: 06/22/22  3:44 PM  Result Value Ref Range   WBC 10.1 4.0 - 10.5 K/uL   RBC 4.46 3.87 - 5.11 MIL/uL   Hemoglobin 13.4 12.0 - 15.0 g/dL   HCT 98.1 19.1 - 47.8 %  MCV 82.7 80.0 - 100.0 fL   MCH 30.0 26.0 - 34.0 pg   MCHC 36.3 (H) 30.0 - 36.0 g/dL   RDW 16.1 09.6 - 04.5 %   Platelets 184 150 - 400 K/uL   nRBC 0.0 0.0 - 0.2 %    Comment: Performed at [facility], [address], [place], [place] [address]  Troponin I (High Sensitivity)     Status: None   Collection Time: 06/22/22  3:44 PM  Result Value Ref Range   Troponin I (High Sensitivity) 15 <18 ng/L    Comment: (NOTE) Elevated high sensitivity troponin I (hsTnI) values and significant  changes across serial measurements may suggest ACS but many other  chronic and acute conditions are known to elevate hsTnI results.  Refer to the "Links" section for chest pain algorithms and additional  guidance. Performed at [facility], [address], [place], [place] [address]   CBG monitoring, ED     Status: Abnormal   Collection Time: 06/22/22  3:47 PM  Result Value Ref Range   Glucose-Capillary 135 (H) 70 - 99 mg/dL    Comment: Glucose reference range applies only to samples taken after fasting for at least 8 hours.  Troponin I (High Sensitivity)     Status: Abnormal   Collection Time: 06/22/22  6:23 PM  Result Value Ref Range   Troponin I (High Sensitivity) 18 (H) <18 ng/L    Comment: (NOTE) Elevated high sensitivity troponin I (hsTnI) values and significant  changes across serial measurements may suggest ACS but many other  chronic and acute conditions are known to elevate hsTnI results.  Refer to the "Links" section for chest pain  algorithms and additional  guidance. Performed at [facility], [address], [place], [place] [address]   Urinalysis, Routine w reflex microscopic -Urine, Clean Catch     Status: Abnormal   Collection Time: 06/22/22  8:47 PM  Result Value Ref Range   Color, Urine YELLOW YELLOW   APPearance CLEAR CLEAR   Specific Gravity, Urine 1.014 1.005 - 1.030   pH 6.0 5.0 - 8.0   Glucose, UA NEGATIVE NEGATIVE mg/dL   Hgb urine dipstick NEGATIVE NEGATIVE   Bilirubin Urine NEGATIVE NEGATIVE   Ketones, ur 5 (A) NEGATIVE mg/dL   Protein, ur 30 (A) NEGATIVE mg/dL   Nitrite NEGATIVE NEGATIVE   Leukocytes,Ua NEGATIVE NEGATIVE   RBC / HPF 0-5 0 - 5 RBC/hpf   WBC, UA 0-5 0 - 5 WBC/hpf   Bacteria, UA NONE SEEN NONE SEEN   Squamous Epithelial / HPF 0-5 0 - 5 /HPF   Mucus PRESENT     Comment: Performed at [facility], [address] [place], [place] [address]   DG Chest 1 View  Result Date: 06/22/2022 CLINICAL DATA:  Syncopal episode and fall today. EXAM: CHEST  1 VIEW COMPARISON:  10/18/2021 FINDINGS: Heart size and pulmonary vascularity are normal. Linear fibrosis or atelectasis in the lung bases, similar to prior study. No airspace disease or consolidation. No pleural effusions. No pneumothorax. Mediastinal contours appear intact. Calcification of the aorta. Degenerative changes in the spine and shoulders. IMPRESSION: Linear atelectasis or fibrosis in the lung bases. No evidence of active pulmonary disease. Electronically Signed   By: Burman Nieves M.D.   On: 06/22/2022 17:58   DG Hip Unilat W or Wo Pelvis 2-3 Views Right  Result Date: 06/22/2022 CLINICAL DATA:  Right hip pain after syncopal episode and fall today.  EXAM: DG HIP (WITH OR WITHOUT PELVIS) 2-3V RIGHT COMPARISON:  None Available. FINDINGS: Mild degenerative changes in the hips. Pelvis and right hip appear intact. No evidence of acute fracture or dislocation. No focal bone lesion or bone destruction. SI joints and symphysis  pubis are not displaced. IMPRESSION: Degenerative changes in the right hip. No acute displaced fractures identified. Electronically Signed   By: Burman Nieves M.D.   On: 06/22/2022 17:57   CT Maxillofacial Wo Contrast  Result Date: 06/22/2022 CLINICAL DATA:  Blunt facial trauma with left zygomatic arch pain and ecchymosis. Left jaw pain. Syncope or presyncope. EXAM: CT MAXILLOFACIAL WITHOUT CONTRAST TECHNIQUE: Multidetector CT imaging of the maxillofacial structures was performed. Multiplanar CT image reconstructions were also generated. RADIATION DOSE REDUCTION: This exam was performed according to the departmental dose-optimization program which includes automated exposure control, adjustment of the mA and/or kV according to patient size and/or use of iterative reconstruction technique. COMPARISON:  CT head 06/01/2021 FINDINGS: Osseous: Acute comminuted and depressed fractures of the zygomatic arch with fracture lines extending to the temporomandibular joint surface. Acute comminuted and depressed fractures of the lateral left orbital wall with displacement of a tiny fragment into the extraconal intraorbital space, partially displacing the rectus lateralis muscle. Mildly depressed fracture of the posterior aspect of the inferior orbital wall with gas demonstrated in the pterygoid canal. Comminuted and depressed fractures of the lateral left maxillary antral wall extending to the upper maxilla. The mandible appears intact. Degenerative changes in the temporomandibular joints. Orbits: The globes and extraocular muscles appear intact and symmetrical. Medial displacement of the left lateral rectus muscle as above. Sinuses: Air-fluid level in the left maxillary antrum. Soft tissues: Soft tissue infiltration consistent with contusion or hematoma in the lateral aspect of the left orbital and facial region centered mostly around the zygomatic arch fractures. Limited intracranial: See additional report of CT head  same date. IMPRESSION: 1. Multiple acute and depressed/displaced left orbital and facial fractures as discussed, including comminuted zygomatic arch fractures extending to the temporomandibular joint surface, a lateral orbital fracture with displaced fragment causing medial displacement of the lateral rectus muscle, and orbital/maxillary antral fractures extending to the pterygoid canal with gas in the canal. 2. Associated posttraumatic air-fluid level in the left maxillary antrum. 1. Soft tissue contusion over the left side of the face and zygomatic region. Electronically Signed   By: Burman Nieves M.D.   On: 06/22/2022 17:56   CT HEAD WO CONTRAST  Result Date: 06/22/2022 CLINICAL DATA:  Syncope or presyncope with cerebrovascular cause suspected. Facial trauma. Left jaw pain. EXAM: CT HEAD WITHOUT CONTRAST TECHNIQUE: Contiguous axial images were obtained from the base of the skull through the vertex without intravenous contrast. RADIATION DOSE REDUCTION: This exam was performed according to the departmental dose-optimization program which includes automated exposure control, adjustment of the mA and/or kV according to patient size and/or use of iterative reconstruction technique. COMPARISON:  MRI brain 10/19/2021.  CT head 06/01/2021 FINDINGS: Brain: Diffuse cerebral atrophy. Ventricular dilatation consistent with central atrophy. Low-attenuation changes in the deep white matter consistent with small vessel ischemia. No abnormal extra-axial fluid collections. No mass effect or midline shift. Gray-white matter junctions are distinct. Basal cisterns are not effaced. No acute intracranial hemorrhage. Vascular: No hyperdense vessel or unexpected calcification. Skull: Normal. Negative for fracture or focal lesion. Sinuses/Orbits: Air-fluid level in the left maxillary antrum with multiple left facial fractures. See additional report of CT maxillofacial. Mastoid air cells are clear. Other: None. IMPRESSION: 1. No  acute intracranial abnormalities. Chronic atrophy and small vessel ischemic changes. 2. Left facial fractures with air-fluid level in the left maxillary antrum. See additional report of CT maxillofacial same date. Electronically Signed   By: Burman Nieves M.D.   On: 06/22/2022 17:37   DG Wrist Complete Right  Result Date: 06/22/2022 CLINICAL DATA:  Pain after fall EXAM: RIGHT WRIST - COMPLETE 4 VIEW COMPARISON:  None Available. FINDINGS: Nondisplaced fracture of the extreme distal radius involving the radiocarpal joint and the radial styloid. No additional fracture or dislocation. Osteopenia. Chondrocalcinosis. Soft tissue swelling. IMPRESSION: Nondisplaced distal radial fracture involving the base of the radial styloid. Fracture line extends to the radiocarpal joint. Chondrocalcinosis and soft tissue swelling. Electronically Signed   By: Karen Kays M.D.   On: 06/22/2022 16:11      Assessment/Plan 89 yo female s/p ground-level fall secondary to a syncopal event. - L facial fractures: ENT consulted by EDP - R radius fracture: Ortho consult pending. Splint placed in ED. - Recommend a C spine CT to rule out cervical spin fractures given fall with facial trauma. - PT/OT evaluations in am - CXR with no rib fractures or pneumothorax. No need for further chest or abdominal imaging given absence of chest/abdominal pain and no external signs of trauma on exam. - Medical admission for syncopal workup   Sophronia Simas, MD [facility] General, Hepatobiliary and Pancreatic Surgery 06/22/22 10:02 PM

## 2022-06-22 NOTE — ED Notes (Signed)
Patient asked if she is able to provide a urine sample. Patient states she is unable to at this time

## 2022-06-22 NOTE — ED Notes (Signed)
ED TO INPATIENT HANDOFF REPORT  ED Nurse Name and Phone #:  Johnna Acosta [phone]  S Name/Age/Gender [name] 89 y.o. female Room/Bed: 020C/020C  Code Status   Code Status: Prior  Home/SNF/Other Home Patient oriented to: self, place, time, and situation Is this baseline? Yes   Triage Complete: Triage complete  Chief Complaint Syncope [R55]  Triage Note Pt BIBGEMS from home after having a fall/syncopal episode, she just fell over. Left side of face and eye edema, laceration above right eye, edema to right wrist. Denies memory of the fall. Denies head and neck pain. Pt does report a headache and lightheadedness prior to the fall.   200/100 BP 88 HR 99 % RA 171 CBG     Allergies Allergies  Allergen Reactions   Atorvastatin Other (See Comments)    Unknown reaction   Desmopressin Acetate Other (See Comments)   Iodinated Contrast Media Hives and Other (See Comments)   Myrbetriq [Mirabegron] Other (See Comments)    "Makes me urinate more frequently"   Iodine Rash    Level of Care/Admitting Diagnosis ED Disposition     ED Disposition  Admit   Condition  --   Comment  Hospital Area: [facility] [[id number]]  Level of Care: Telemetry Cardiac [[id number]]  May place patient in observation at [facility] or [facility] if equivalent level of care is available:: No  Covid Evaluation: Asymptomatic - no recent exposure (last 10 days) testing not required  Diagnosis: Syncope [206001]  Admitting Physician: Charlsie Quest [[id number]]  Attending Physician: Charlsie Quest [[id number]]          B Medical/Surgery History Past Medical History:  Diagnosis Date   Abnormal Pap smear 1975-76   Acute deep vein thrombosis (DVT) of left peroneal vein (HCC)    Anemia    history of   Arthritis    spine   Breast cyst, left 1980   Cystocele 2012   Diverticulosis    with h/o Diverticulitis   DVT, lower extremity, distal, chronic (HCC) 12/24/2018   GERD (gastroesophageal  reflux disease)    H/O hypercholesterolemia    H/O osteopenia    H/O varicella    H/O varicose veins    Hx of Mumps    Macular degeneration    Seasonal allergies    TIA (transient ischemic attack)    Urge incontinence 2012   Urinary frequency 2010   Past Surgical History:  Procedure Laterality Date   14-Day Zio Patch  12/2021   Predominantly sinus rhythm open and 47-1:15 PM, average 70 bpm (1  AVB); rare isolated PACs PVCs.  55 atrial runs 4-28 beats fastest heart rate ranges 169 to 176 bpm.  Episodes lasting 1.8 to 15 seconds.  No sustained arrhythmias.  Symptoms not noted with atrial runs.   ABDOMINAL HYSTERECTOMY     BLADDER SUSPENSION N/A 06/16/2016   Procedure: TRANSVAGINAL TAPE (TVT) PROCEDURE;  Surgeon: Osborn Coho, MD;  Location: [facility];  Service: Gynecology;  Laterality: N/A;   BREAST CYST ASPIRATION  1964   COLONOSCOPY     Dr. Kinnie Scales   CT CTA CORONARY W/CA SCORE W/CM &/OR WO/CM  11/2021   1. Mild mixed non-obstructive CAD, CADRADS = 2.   2. Coronary calcium score of 255. This was 73rd percentile for age and sex matched control (based on an 89 yo).  3. Normal coronary origin with right dominance.   4. Aortic and mitral annular calcification.  5. Aortic atherosclerosis   CYSTOCELE REPAIR N/A  06/16/2016   Procedure: ANTERIOR REPAIR (CYSTOCELE);  Surgeon: Osborn Coho, MD;  Location: [facility];  Service: Gynecology;  Laterality: N/A;   CYSTOSCOPY N/A 06/16/2016   Procedure: CYSTOSCOPY;  Surgeon: Osborn Coho, MD;  Location: [facility];  Service: Gynecology;  Laterality: N/A;  see anterior repair   Lower Extremity Venous Dopplers  01/09/2012   Right and left lower steroids: No evidence of thrombus or, thrombophlebitis; right and left GSV and SSV: No venous insufficiency. Normal exam.   NM MYOVIEW LTD  11/2016   6.6 METS.  Reached 106% of max.  Heart rate.  Walk for 4:40 min.  EF 72%.  Normal blood pressure response.  upsloping ST segment depression, nonspecific.  Otherwise  normal study.  LOW RISK.  No evidence of ischemia or infarction.   TRANSTHORACIC ECHOCARDIOGRAM  11/2021   Normal LV Size & Fxn - EF 60-65%. No RWMA. ~ Diastolic Fxn. Degenerative MV - Mod MAC. ~ Mod AS     A IV Location/Drains/Wounds Patient Lines/Drains/Airways Status     Active Line/Drains/Airways     Name Placement date Placement time Site Days   Peripheral IV 06/22/22 20 G Anterior;Left;Proximal Forearm 06/22/22  1624  Forearm  less than 1   Urethral Catheter Taivon Haroon RN Straight-tip 16 Fr. 06/22/22  2045  Straight-tip  less than 1   Incision (Closed) 06/16/16 Perineum Other (Comment) 06/16/16  1048  -- 2197   Incision - 2 Ports Abdomen 1: Right;Lower 2: Left;Lower 06/16/16  --  -- 2197   Wound / Incision (Open or Dehisced) 06/22/22 Laceration Face Lateral;Left;Upper 06/22/22  1552  Face  less than 1            Intake/Output Last 24 hours  Intake/Output Summary (Last 24 hours) at 06/22/2022 2238 Last data filed at 06/22/2022 2158 Gross per 24 hour  Intake 1000 ml  Output --  Net 1000 ml    Labs/Imaging Results for orders placed or performed during the hospital encounter of 06/22/22 (from the past 48 hour(s))  Basic metabolic panel     Status: Abnormal   Collection Time: 06/22/22  3:44 PM  Result Value Ref Range   Sodium 133 (L) 135 - 145 mmol/L   Potassium 4.1 3.5 - 5.1 mmol/L   Chloride 98 98 - 111 mmol/L   CO2 22 22 - 32 mmol/L   Glucose, Bld 140 (H) 70 - 99 mg/dL    Comment: Glucose reference range applies only to samples taken after fasting for at least 8 hours.   BUN 23 8 - 23 mg/dL   Creatinine, Ser 1.61 (H) 0.44 - 1.00 mg/dL   Calcium 9.4 8.9 - 09.6 mg/dL   GFR, Estimated 40 (L) >60 mL/min    Comment: (NOTE) Calculated using the CKD-EPI Creatinine Equation (2021)    Anion gap 13 5 - 15    Comment: Performed at [facility], [address], [place], [place] [address]  CBC     Status: Abnormal   Collection Time: 06/22/22  3:44 PM  Result Value Ref  Range   WBC 10.1 4.0 - 10.5 K/uL   RBC 4.46 3.87 - 5.11 MIL/uL   Hemoglobin 13.4 12.0 - 15.0 g/dL   HCT 98.1 19.1 - 47.8 %   MCV 82.7 80.0 - 100.0 fL   MCH 30.0 26.0 - 34.0 pg   MCHC 36.3 (H) 30.0 - 36.0 g/dL   RDW 29.5 62.1 - 30.8 %   Platelets 184 150 - 400 K/uL   nRBC  0.0 0.0 - 0.2 %    Comment: Performed at [facility], [address] [place], [place] [address]  Troponin I (High Sensitivity)     Status: None   Collection Time: 06/22/22  3:44 PM  Result Value Ref Range   Troponin I (High Sensitivity) 15 <18 ng/L    Comment: (NOTE) Elevated high sensitivity troponin I (hsTnI) values and significant  changes across serial measurements may suggest ACS but many other  chronic and acute conditions are known to elevate hsTnI results.  Refer to the "Links" section for chest pain algorithms and additional  guidance. Performed at [facility], [address], [place], [place] [address]   CBG monitoring, ED     Status: Abnormal   Collection Time: 06/22/22  3:47 PM  Result Value Ref Range   Glucose-Capillary 135 (H) 70 - 99 mg/dL    Comment: Glucose reference range applies only to samples taken after fasting for at least 8 hours.  Troponin I (High Sensitivity)     Status: Abnormal   Collection Time: 06/22/22  6:23 PM  Result Value Ref Range   Troponin I (High Sensitivity) 18 (H) <18 ng/L    Comment: (NOTE) Elevated high sensitivity troponin I (hsTnI) values and significant  changes across serial measurements may suggest ACS but many other  chronic and acute conditions are known to elevate hsTnI results.  Refer to the "Links" section for chest pain algorithms and additional  guidance. Performed at [facility], [address] [place], [place] [address]   Urinalysis, Routine w reflex microscopic -Urine, Clean Catch     Status: Abnormal   Collection Time: 06/22/22  8:47 PM  Result Value Ref Range   Color, Urine YELLOW YELLOW   APPearance CLEAR CLEAR    Specific Gravity, Urine 1.014 1.005 - 1.030   pH 6.0 5.0 - 8.0   Glucose, UA NEGATIVE NEGATIVE mg/dL   Hgb urine dipstick NEGATIVE NEGATIVE   Bilirubin Urine NEGATIVE NEGATIVE   Ketones, ur 5 (A) NEGATIVE mg/dL   Protein, ur 30 (A) NEGATIVE mg/dL   Nitrite NEGATIVE NEGATIVE   Leukocytes,Ua NEGATIVE NEGATIVE   RBC / HPF 0-5 0 - 5 RBC/hpf   WBC, UA 0-5 0 - 5 WBC/hpf   Bacteria, UA NONE SEEN NONE SEEN   Squamous Epithelial / HPF 0-5 0 - 5 /HPF   Mucus PRESENT     Comment: Performed at [facility], [address], [place], [place] [address]   DG Chest 1 View  Result Date: 06/22/2022 CLINICAL DATA:  Syncopal episode and fall today. EXAM: CHEST  1 VIEW COMPARISON:  10/18/2021 FINDINGS: Heart size and pulmonary vascularity are normal. Linear fibrosis or atelectasis in the lung bases, similar to prior study. No airspace disease or consolidation. No pleural effusions. No pneumothorax. Mediastinal contours appear intact. Calcification of the aorta. Degenerative changes in the spine and shoulders. IMPRESSION: Linear atelectasis or fibrosis in the lung bases. No evidence of active pulmonary disease. Electronically Signed   By: Burman Nieves M.D.   On: 06/22/2022 17:58   DG Hip Unilat W or Wo Pelvis 2-3 Views Right  Result Date: 06/22/2022 CLINICAL DATA:  Right hip pain after syncopal episode and fall today. EXAM: DG HIP (WITH OR WITHOUT PELVIS) 2-3V RIGHT COMPARISON:  None Available. FINDINGS: Mild degenerative changes in the hips. Pelvis and right hip appear intact. No evidence of acute fracture or dislocation. No focal bone lesion or bone destruction. SI joints  and symphysis pubis are not displaced. IMPRESSION: Degenerative changes in the right hip. No acute displaced fractures identified. Electronically Signed   By: Burman Nieves M.D.   On: 06/22/2022 17:57   CT Maxillofacial Wo Contrast  Result Date: 06/22/2022 CLINICAL DATA:  Blunt facial trauma with left zygomatic arch pain and  ecchymosis. Left jaw pain. Syncope or presyncope. EXAM: CT MAXILLOFACIAL WITHOUT CONTRAST TECHNIQUE: Multidetector CT imaging of the maxillofacial structures was performed. Multiplanar CT image reconstructions were also generated. RADIATION DOSE REDUCTION: This exam was performed according to the departmental dose-optimization program which includes automated exposure control, adjustment of the mA and/or kV according to patient size and/or use of iterative reconstruction technique. COMPARISON:  CT head 06/01/2021 FINDINGS: Osseous: Acute comminuted and depressed fractures of the zygomatic arch with fracture lines extending to the temporomandibular joint surface. Acute comminuted and depressed fractures of the lateral left orbital wall with displacement of a tiny fragment into the extraconal intraorbital space, partially displacing the rectus lateralis muscle. Mildly depressed fracture of the posterior aspect of the inferior orbital wall with gas demonstrated in the pterygoid canal. Comminuted and depressed fractures of the lateral left maxillary antral wall extending to the upper maxilla. The mandible appears intact. Degenerative changes in the temporomandibular joints. Orbits: The globes and extraocular muscles appear intact and symmetrical. Medial displacement of the left lateral rectus muscle as above. Sinuses: Air-fluid level in the left maxillary antrum. Soft tissues: Soft tissue infiltration consistent with contusion or hematoma in the lateral aspect of the left orbital and facial region centered mostly around the zygomatic arch fractures. Limited intracranial: See additional report of CT head same date. IMPRESSION: 1. Multiple acute and depressed/displaced left orbital and facial fractures as discussed, including comminuted zygomatic arch fractures extending to the temporomandibular joint surface, a lateral orbital fracture with displaced fragment causing medial displacement of the lateral rectus muscle, and  orbital/maxillary antral fractures extending to the pterygoid canal with gas in the canal. 2. Associated posttraumatic air-fluid level in the left maxillary antrum. 1. Soft tissue contusion over the left side of the face and zygomatic region. Electronically Signed   By: Burman Nieves M.D.   On: 06/22/2022 17:56   CT HEAD WO CONTRAST  Result Date: 06/22/2022 CLINICAL DATA:  Syncope or presyncope with cerebrovascular cause suspected. Facial trauma. Left jaw pain. EXAM: CT HEAD WITHOUT CONTRAST TECHNIQUE: Contiguous axial images were obtained from the base of the skull through the vertex without intravenous contrast. RADIATION DOSE REDUCTION: This exam was performed according to the departmental dose-optimization program which includes automated exposure control, adjustment of the mA and/or kV according to patient size and/or use of iterative reconstruction technique. COMPARISON:  MRI brain 10/19/2021.  CT head 06/01/2021 FINDINGS: Brain: Diffuse cerebral atrophy. Ventricular dilatation consistent with central atrophy. Low-attenuation changes in the deep white matter consistent with small vessel ischemia. No abnormal extra-axial fluid collections. No mass effect or midline shift. Gray-white matter junctions are distinct. Basal cisterns are not effaced. No acute intracranial hemorrhage. Vascular: No hyperdense vessel or unexpected calcification. Skull: Normal. Negative for fracture or focal lesion. Sinuses/Orbits: Air-fluid level in the left maxillary antrum with multiple left facial fractures. See additional report of CT maxillofacial. Mastoid air cells are clear. Other: None. IMPRESSION: 1. No acute intracranial abnormalities. Chronic atrophy and small vessel ischemic changes. 2. Left facial fractures with air-fluid level in the left maxillary antrum. See additional report of CT maxillofacial same date. Electronically Signed   By: Burman Nieves M.D.   On: 06/22/2022  17:37   DG Wrist Complete Right  Result  Date: 06/22/2022 CLINICAL DATA:  Pain after fall EXAM: RIGHT WRIST - COMPLETE 4 VIEW COMPARISON:  None Available. FINDINGS: Nondisplaced fracture of the extreme distal radius involving the radiocarpal joint and the radial styloid. No additional fracture or dislocation. Osteopenia. Chondrocalcinosis. Soft tissue swelling. IMPRESSION: Nondisplaced distal radial fracture involving the base of the radial styloid. Fracture line extends to the radiocarpal joint. Chondrocalcinosis and soft tissue swelling. Electronically Signed   By: Karen Kays M.D.   On: 06/22/2022 16:11    Pending Labs Unresulted Labs (From admission, onward)    None       Vitals/Pain Today's Vitals   06/22/22 2000 06/22/22 2003 06/22/22 2100 06/22/22 2200  BP: (!) 148/87  (!) 149/74 (!) 142/74  Pulse: (!) 111  99 99  Resp: 19  16 13   Temp:      TempSrc:      SpO2: 96%  97% 95%  Weight:      Height:      PainSc:  8   7     Isolation Precautions No active isolations  Medications Medications  ondansetron (ZOFRAN) injection 4 mg (4 mg Intravenous Given 06/22/22 1634)  fentaNYL (SUBLIMAZE) injection 50 mcg (50 mcg Intravenous Given 06/22/22 1633)  morphine (PF) 4 MG/ML injection 4 mg (4 mg Intravenous Given 06/22/22 1940)  sodium chloride 0.9 % bolus 1,000 mL (0 mLs Intravenous Stopped 06/22/22 2158)    Mobility walks     Focused Assessments Cardiac Assessment Handoff:  Cardiac Rhythm: Sinus tachycardia Lab Results  Component Value Date   CKTOTAL 85 09/30/2010   CKMB 2.5 09/30/2010   TROPONINI <0.30 01/21/2013   Lab Results  Component Value Date   DDIMER 0.58 (H) 10/18/2021   Does the Patient currently have chest pain? No    R Recommendations: See Admitting Provider Note  Report given to:   Additional Notes:

## 2022-06-22 NOTE — ED Triage Notes (Signed)
Pt BIBGEMS from home after having a fall/syncopal episode, she just fell over. Left side of face and eye edema, laceration above right eye, edema to right wrist. Denies memory of the fall. Denies head and neck pain. Pt does report a headache and lightheadedness prior to the fall.   200/100 BP 88 HR 99 % RA 171 CBG

## 2022-06-23 ENCOUNTER — Observation Stay (HOSPITAL_BASED_OUTPATIENT_CLINIC_OR_DEPARTMENT_OTHER): Payer: Medicare PPO

## 2022-06-23 DIAGNOSIS — R55 Syncope and collapse: Secondary | ICD-10-CM

## 2022-06-23 DIAGNOSIS — S52502A Unspecified fracture of the lower end of left radius, initial encounter for closed fracture: Secondary | ICD-10-CM

## 2022-06-23 LAB — BASIC METABOLIC PANEL
Anion gap: 9 (ref 5–15)
BUN: 22 mg/dL (ref 8–23)
CO2: 23 mmol/L (ref 22–32)
Calcium: 8.5 mg/dL — ABNORMAL LOW (ref 8.9–10.3)
Chloride: 101 mmol/L (ref 98–111)
Creatinine, Ser: 1.15 mg/dL — ABNORMAL HIGH (ref 0.44–1.00)
GFR, Estimated: 45 mL/min — ABNORMAL LOW (ref >60)
Glucose, Bld: 142 mg/dL — ABNORMAL HIGH (ref 70–99)
Potassium: 4.4 mmol/L (ref 3.5–5.1)
Sodium: 133 mmol/L — ABNORMAL LOW (ref 135–145)

## 2022-06-23 LAB — CBC
HCT: 33.4 % — ABNORMAL LOW (ref 36.0–46.0)
Hemoglobin: 12.1 g/dL (ref 12.0–15.0)
MCH: 30.3 pg (ref 26.0–34.0)
MCHC: 36.2 g/dL — ABNORMAL HIGH (ref 30.0–36.0)
MCV: 83.7 fL (ref 80.0–100.0)
Platelets: 171 10*3/uL (ref 150–400)
RBC: 3.99 MIL/uL (ref 3.87–5.11)
RDW: 13.4 % (ref 11.5–15.5)
WBC: 8.1 10*3/uL (ref 4.0–10.5)
nRBC: 0 % (ref 0.0–0.2)

## 2022-06-23 LAB — ECHOCARDIOGRAM COMPLETE
AR max vel: 1.1 cm2
AV Area VTI: 1.09 cm2
AV Area mean vel: 1.27 cm2
AV Mean grad: 21 mmHg
AV Peak grad: 41.5 mmHg
Ao pk vel: 3.22 m/s
Area-P 1/2: 1.54 cm2
Height: 63 in
S' Lateral: 1.9 cm
Weight: 2000 oz

## 2022-06-23 LAB — TROPONIN I (HIGH SENSITIVITY): Troponin I (High Sensitivity): 22 ng/L — ABNORMAL HIGH (ref <18)

## 2022-06-23 LAB — SARS CORONAVIRUS 2 BY RT PCR: SARS Coronavirus 2 by RT PCR: NEGATIVE

## 2022-06-23 LAB — GLUCOSE, CAPILLARY: Glucose-Capillary: 106 mg/dL — ABNORMAL HIGH (ref 70–99)

## 2022-06-23 MED ORDER — GUAIFENESIN ER 600 MG PO TB12
1200.0000 mg | ORAL_TABLET | Freq: Two times a day (BID) | ORAL | Status: DC
  Administered 2022-06-23 – 2022-06-25 (×4): 1200 mg via ORAL
  Filled 2022-06-23 (×4): qty 2

## 2022-06-23 MED ORDER — METOPROLOL SUCCINATE ER 25 MG PO TB24
25.0000 mg | ORAL_TABLET | Freq: Every evening | ORAL | Status: DC
  Administered 2022-06-23 – 2022-06-24 (×2): 25 mg via ORAL
  Filled 2022-06-23 (×2): qty 1

## 2022-06-23 MED ORDER — ALBUTEROL SULFATE (2.5 MG/3ML) 0.083% IN NEBU: 3.0000 mL | INHALATION_SOLUTION | RESPIRATORY_TRACT | Status: DC | PRN

## 2022-06-23 MED ORDER — AZITHROMYCIN 500 MG PO TABS
500.0000 mg | ORAL_TABLET | Freq: Every day | ORAL | Status: DC
  Administered 2022-06-23 – 2022-06-25 (×3): 500 mg via ORAL
  Filled 2022-06-23 (×3): qty 1

## 2022-06-23 MED ORDER — CHLORHEXIDINE GLUCONATE CLOTH 2 % EX PADS
6.0000 | MEDICATED_PAD | Freq: Every day | CUTANEOUS | Status: DC
  Administered 2022-06-23 – 2022-06-24 (×2): 6 via TOPICAL

## 2022-06-23 MED ORDER — DEXTROMETHORPHAN POLISTIREX ER 30 MG/5ML PO SUER
15.0000 mg | Freq: Two times a day (BID) | ORAL | Status: DC | PRN
  Administered 2022-06-23: 15 mg via ORAL
  Filled 2022-06-23 (×2): qty 5

## 2022-06-23 NOTE — Consult Note (Signed)
Reason for Consult:facial fractures Referring Physician: Dr Val Eagle is an 89 y.o. female.  HPI: hx of fall and currently getting work up for etiology. She has laft zygomata fx and orbital fx. She has no vision changes. She has no diplopia. No malocclusion.   Past Medical History:  Diagnosis Date   Abnormal Pap smear 1975-76   Acute deep vein thrombosis (DVT) of left peroneal vein (HCC)    Anemia    history of   Arthritis    spine   Breast cyst, left 1980   Cystocele 2012   Diverticulosis    with h/o Diverticulitis   DVT, lower extremity, distal, chronic (HCC) 12/24/2018   GERD (gastroesophageal reflux disease)    H/O hypercholesterolemia    H/O osteopenia    H/O varicella    H/O varicose veins    Hx of Mumps    Macular degeneration    Seasonal allergies    TIA (transient ischemic attack)    Urge incontinence 2012   Urinary frequency 2010    Past Surgical History:  Procedure Laterality Date   14-Day Zio Patch  12/2021   Predominantly sinus rhythm open and 47-1:15 PM, average 70 bpm (1  AVB); rare isolated PACs PVCs.  55 atrial runs 4-28 beats fastest heart rate ranges 169 to 176 bpm.  Episodes lasting 1.8 to 15 seconds.  No sustained arrhythmias.  Symptoms not noted with atrial runs.   ABDOMINAL HYSTERECTOMY     BLADDER SUSPENSION N/A 06/16/2016   Procedure: TRANSVAGINAL TAPE (TVT) PROCEDURE;  Surgeon: Osborn Coho, MD;  Location: [facility];  Service: Gynecology;  Laterality: N/A;   BREAST CYST ASPIRATION  1964   COLONOSCOPY     Dr. Kinnie Scales   CT CTA CORONARY W/CA SCORE W/CM &/OR WO/CM  11/2021   1. Mild mixed non-obstructive CAD, CADRADS = 2.   2. Coronary calcium score of 255. This was 73rd percentile for age and sex matched control (based on an 89 yo).  3. Normal coronary origin with right dominance.   4. Aortic and mitral annular calcification.  5. Aortic atherosclerosis   CYSTOCELE REPAIR N/A 06/16/2016   Procedure: ANTERIOR REPAIR (CYSTOCELE);   Surgeon: Osborn Coho, MD;  Location: [facility];  Service: Gynecology;  Laterality: N/A;   CYSTOSCOPY N/A 06/16/2016   Procedure: CYSTOSCOPY;  Surgeon: Osborn Coho, MD;  Location: [facility];  Service: Gynecology;  Laterality: N/A;  see anterior repair   Lower Extremity Venous Dopplers  01/09/2012   Right and left lower steroids: No evidence of thrombus or, thrombophlebitis; right and left GSV and SSV: No venous insufficiency. Normal exam.   NM MYOVIEW LTD  11/2016   6.6 METS.  Reached 106% of max.  Heart rate.  Walk for 4:40 min.  EF 72%.  Normal blood pressure response.  upsloping ST segment depression, nonspecific.  Otherwise normal study.  LOW RISK.  No evidence of ischemia or infarction.   TRANSTHORACIC ECHOCARDIOGRAM  11/2021   Normal LV Size & Fxn - EF 60-65%. No RWMA. ~ Diastolic Fxn. Degenerative MV - Mod MAC. ~ Mod AS    Family History  Problem Relation Age of Onset   Uterine cancer Mother 33   Heart attack Brother 7   Breast cancer Daughter    Stroke Neg Hx     Social History:  reports that she has never smoked. She has never been exposed to tobacco smoke. She has never used smokeless tobacco. She reports that she does not drink alcohol  and does not use drugs.  Allergies:  Allergies  Allergen Reactions   Atorvastatin Other (See Comments)    Unknown reaction   Desmopressin Acetate Other (See Comments)   Iodinated Contrast Media Hives and Other (See Comments)   Myrbetriq [Mirabegron] Other (See Comments)    "Makes me urinate more frequently"   Iodine Rash    Medications: I have reviewed the patient's current medications.  Results for orders placed or performed during the hospital encounter of 06/22/22 (from the past 48 hour(s))  Basic metabolic panel     Status: Abnormal   Collection Time: 06/22/22  3:44 PM  Result Value Ref Range   Sodium 133 (L) 135 - 145 mmol/L   Potassium 4.1 3.5 - 5.1 mmol/L   Chloride 98 98 - 111 mmol/L   CO2 22 22 - 32 mmol/L   Glucose, Bld  140 (H) 70 - 99 mg/dL    Comment: Glucose reference range applies only to samples taken after fasting for at least 8 hours.   BUN 23 8 - 23 mg/dL   Creatinine, Ser 7.82 (H) 0.44 - 1.00 mg/dL   Calcium 9.4 8.9 - 95.6 mg/dL   GFR, Estimated 40 (L) >60 mL/min    Comment: (NOTE) Calculated using the CKD-EPI Creatinine Equation (2021)    Anion gap 13 5 - 15    Comment: Performed at [facility], [address], [place], [place] [address]  CBC     Status: Abnormal   Collection Time: 06/22/22  3:44 PM  Result Value Ref Range   WBC 10.1 4.0 - 10.5 K/uL   RBC 4.46 3.87 - 5.11 MIL/uL   Hemoglobin 13.4 12.0 - 15.0 g/dL   HCT 65.7 84.6 - 96.2 %   MCV 82.7 80.0 - 100.0 fL   MCH 30.0 26.0 - 34.0 pg   MCHC 36.3 (H) 30.0 - 36.0 g/dL   RDW 95.2 84.1 - 32.4 %   Platelets 184 150 - 400 K/uL   nRBC 0.0 0.0 - 0.2 %    Comment: Performed at [facility], [address], [place], [place] [address]  Troponin I (High Sensitivity)     Status: None   Collection Time: 06/22/22  3:44 PM  Result Value Ref Range   Troponin I (High Sensitivity) 15 <18 ng/L    Comment: (NOTE) Elevated high sensitivity troponin I (hsTnI) values and significant  changes across serial measurements may suggest ACS but many other  chronic and acute conditions are known to elevate hsTnI results.  Refer to the "Links" section for chest pain algorithms and additional  guidance. Performed at [facility], [address], [place], [place] [address]   CBG monitoring, ED     Status: Abnormal   Collection Time: 06/22/22  3:47 PM  Result Value Ref Range   Glucose-Capillary 135 (H) 70 - 99 mg/dL    Comment: Glucose reference range applies only to samples taken after fasting for at least 8 hours.  Troponin I (High Sensitivity)     Status: Abnormal   Collection Time: 06/22/22  6:23 PM  Result Value Ref Range   Troponin I (High Sensitivity) 18 (H) <18 ng/L    Comment: (NOTE) Elevated high sensitivity troponin I  (hsTnI) values and significant  changes across serial measurements may suggest ACS but many other  chronic and acute conditions are known to elevate hsTnI results.  Refer to the "Links" section for chest pain algorithms and additional  guidance. Performed at  [facility], [address], [place], [place] [address]   Urinalysis, Routine w reflex microscopic -Urine, Clean Catch     Status: Abnormal   Collection Time: 06/22/22  8:47 PM  Result Value Ref Range   Color, Urine YELLOW YELLOW   APPearance CLEAR CLEAR   Specific Gravity, Urine 1.014 1.005 - 1.030   pH 6.0 5.0 - 8.0   Glucose, UA NEGATIVE NEGATIVE mg/dL   Hgb urine dipstick NEGATIVE NEGATIVE   Bilirubin Urine NEGATIVE NEGATIVE   Ketones, ur 5 (A) NEGATIVE mg/dL   Protein, ur 30 (A) NEGATIVE mg/dL   Nitrite NEGATIVE NEGATIVE   Leukocytes,Ua NEGATIVE NEGATIVE   RBC / HPF 0-5 0 - 5 RBC/hpf   WBC, UA 0-5 0 - 5 WBC/hpf   Bacteria, UA NONE SEEN NONE SEEN   Squamous Epithelial / HPF 0-5 0 - 5 /HPF   Mucus PRESENT     Comment: Performed at [facility], [address], [place], [place] [address]  Basic metabolic panel     Status: Abnormal   Collection Time: 06/22/22 11:43 PM  Result Value Ref Range   Sodium 133 (L) 135 - 145 mmol/L   Potassium 4.4 3.5 - 5.1 mmol/L   Chloride 101 98 - 111 mmol/L   CO2 23 22 - 32 mmol/L   Glucose, Bld 142 (H) 70 - 99 mg/dL    Comment: Glucose reference range applies only to samples taken after fasting for at least 8 hours.   BUN 22 8 - 23 mg/dL   Creatinine, Ser 0.98 (H) 0.44 - 1.00 mg/dL   Calcium 8.5 (L) 8.9 - 10.3 mg/dL   GFR, Estimated 45 (L) >60 mL/min    Comment: (NOTE) Calculated using the CKD-EPI Creatinine Equation (2021)    Anion gap 9 5 - 15    Comment: Performed at [facility], [address] [place], [place] [address]  CBC     Status: Abnormal   Collection Time: 06/22/22 11:43 PM  Result Value Ref Range   WBC 8.1 4.0 - 10.5 K/uL   RBC 3.99 3.87 - 5.11  MIL/uL   Hemoglobin 12.1 12.0 - 15.0 g/dL   HCT 78.2 (L) 95.6 - 21.3 %   MCV 83.7 80.0 - 100.0 fL   MCH 30.3 26.0 - 34.0 pg   MCHC 36.2 (H) 30.0 - 36.0 g/dL   RDW 08.6 57.8 - 46.9 %   Platelets 171 150 - 400 K/uL   nRBC 0.0 0.0 - 0.2 %    Comment: Performed at [facility], [address], [place], [place] [address]  Troponin I (High Sensitivity)     Status: Abnormal   Collection Time: 06/22/22 11:43 PM  Result Value Ref Range   Troponin I (High Sensitivity) 22 (H) <18 ng/L    Comment: (NOTE) Elevated high sensitivity troponin I (hsTnI) values and significant  changes across serial measurements may suggest ACS but many other  chronic and acute conditions are known to elevate hsTnI results.  Refer to the "Links" section for chest pain algorithms and additional  guidance. Performed at [facility], [address], [place], [place] [address]   Glucose, capillary     Status: Abnormal   Collection Time: 06/23/22  7:04 AM  Result Value Ref Range   Glucose-Capillary 106 (H) 70 - 99 mg/dL    Comment: Glucose reference range applies only to samples taken after fasting for at least 8 hours.  CT CERVICAL SPINE WO CONTRAST  Result Date: 06/22/2022 CLINICAL DATA:  fall with facial fractures, rule out C spine injury EXAM: CT CERVICAL SPINE WITHOUT CONTRAST TECHNIQUE: Multidetector CT imaging of the cervical spine was performed without intravenous contrast. Multiplanar CT image reconstructions were also generated. RADIATION DOSE REDUCTION: This exam was performed according to the departmental dose-optimization program which includes automated exposure control, adjustment of the mA and/or kV according to patient size and/or use of iterative reconstruction technique. COMPARISON:  None Available. FINDINGS: Alignment: Normal Skull base and vertebrae: No acute fracture. No primary bone lesion or focal pathologic process. Soft tissues and spinal canal: No prevertebral fluid or swelling. No  visible canal hematoma. Disc levels: Mild bilateral degenerative facet disease and diffuse degenerative disc disease. Congenital fusion across the C4-5 disc space. No visible disc herniation. Upper chest: No acute findings Other: None IMPRESSION: No acute bony abnormality. Electronically Signed   By: Charlett Nose M.D.   On: 06/22/2022 23:23   DG Chest 1 View  Result Date: 06/22/2022 CLINICAL DATA:  Syncopal episode and fall today. EXAM: CHEST  1 VIEW COMPARISON:  10/18/2021 FINDINGS: Heart size and pulmonary vascularity are normal. Linear fibrosis or atelectasis in the lung bases, similar to prior study. No airspace disease or consolidation. No pleural effusions. No pneumothorax. Mediastinal contours appear intact. Calcification of the aorta. Degenerative changes in the spine and shoulders. IMPRESSION: Linear atelectasis or fibrosis in the lung bases. No evidence of active pulmonary disease. Electronically Signed   By: Burman Nieves M.D.   On: 06/22/2022 17:58   DG Hip Unilat W or Wo Pelvis 2-3 Views Right  Result Date: 06/22/2022 CLINICAL DATA:  Right hip pain after syncopal episode and fall today. EXAM: DG HIP (WITH OR WITHOUT PELVIS) 2-3V RIGHT COMPARISON:  None Available. FINDINGS: Mild degenerative changes in the hips. Pelvis and right hip appear intact. No evidence of acute fracture or dislocation. No focal bone lesion or bone destruction. SI joints and symphysis pubis are not displaced. IMPRESSION: Degenerative changes in the right hip. No acute displaced fractures identified. Electronically Signed   By: Burman Nieves M.D.   On: 06/22/2022 17:57   CT Maxillofacial Wo Contrast  Result Date: 06/22/2022 CLINICAL DATA:  Blunt facial trauma with left zygomatic arch pain and ecchymosis. Left jaw pain. Syncope or presyncope. EXAM: CT MAXILLOFACIAL WITHOUT CONTRAST TECHNIQUE: Multidetector CT imaging of the maxillofacial structures was performed. Multiplanar CT image reconstructions were also  generated. RADIATION DOSE REDUCTION: This exam was performed according to the departmental dose-optimization program which includes automated exposure control, adjustment of the mA and/or kV according to patient size and/or use of iterative reconstruction technique. COMPARISON:  CT head 06/01/2021 FINDINGS: Osseous: Acute comminuted and depressed fractures of the zygomatic arch with fracture lines extending to the temporomandibular joint surface. Acute comminuted and depressed fractures of the lateral left orbital wall with displacement of a tiny fragment into the extraconal intraorbital space, partially displacing the rectus lateralis muscle. Mildly depressed fracture of the posterior aspect of the inferior orbital wall with gas demonstrated in the pterygoid canal. Comminuted and depressed fractures of the lateral left maxillary antral wall extending to the upper maxilla. The mandible appears intact. Degenerative changes in the temporomandibular joints. Orbits: The globes and extraocular muscles appear intact and symmetrical. Medial displacement of the left lateral rectus muscle as above. Sinuses: Air-fluid level in the left maxillary antrum. Soft tissues: Soft tissue infiltration consistent with contusion or hematoma in the lateral aspect of the left orbital and  facial region centered mostly around the zygomatic arch fractures. Limited intracranial: See additional report of CT head same date. IMPRESSION: 1. Multiple acute and depressed/displaced left orbital and facial fractures as discussed, including comminuted zygomatic arch fractures extending to the temporomandibular joint surface, a lateral orbital fracture with displaced fragment causing medial displacement of the lateral rectus muscle, and orbital/maxillary antral fractures extending to the pterygoid canal with gas in the canal. 2. Associated posttraumatic air-fluid level in the left maxillary antrum. 1. Soft tissue contusion over the left side of the face  and zygomatic region. Electronically Signed   By: Burman Nieves M.D.   On: 06/22/2022 17:56   CT HEAD WO CONTRAST  Result Date: 06/22/2022 CLINICAL DATA:  Syncope or presyncope with cerebrovascular cause suspected. Facial trauma. Left jaw pain. EXAM: CT HEAD WITHOUT CONTRAST TECHNIQUE: Contiguous axial images were obtained from the base of the skull through the vertex without intravenous contrast. RADIATION DOSE REDUCTION: This exam was performed according to the departmental dose-optimization program which includes automated exposure control, adjustment of the mA and/or kV according to patient size and/or use of iterative reconstruction technique. COMPARISON:  MRI brain 10/19/2021.  CT head 06/01/2021 FINDINGS: Brain: Diffuse cerebral atrophy. Ventricular dilatation consistent with central atrophy. Low-attenuation changes in the deep white matter consistent with small vessel ischemia. No abnormal extra-axial fluid collections. No mass effect or midline shift. Gray-white matter junctions are distinct. Basal cisterns are not effaced. No acute intracranial hemorrhage. Vascular: No hyperdense vessel or unexpected calcification. Skull: Normal. Negative for fracture or focal lesion. Sinuses/Orbits: Air-fluid level in the left maxillary antrum with multiple left facial fractures. See additional report of CT maxillofacial. Mastoid air cells are clear. Other: None. IMPRESSION: 1. No acute intracranial abnormalities. Chronic atrophy and small vessel ischemic changes. 2. Left facial fractures with air-fluid level in the left maxillary antrum. See additional report of CT maxillofacial same date. Electronically Signed   By: Burman Nieves M.D.   On: 06/22/2022 17:37   DG Wrist Complete Right  Result Date: 06/22/2022 CLINICAL DATA:  Pain after fall EXAM: RIGHT WRIST - COMPLETE 4 VIEW COMPARISON:  None Available. FINDINGS: Nondisplaced fracture of the extreme distal radius involving the radiocarpal joint and the radial  styloid. No additional fracture or dislocation. Osteopenia. Chondrocalcinosis. Soft tissue swelling. IMPRESSION: Nondisplaced distal radial fracture involving the base of the radial styloid. Fracture line extends to the radiocarpal joint. Chondrocalcinosis and soft tissue swelling. Electronically Signed   By: Karen Kays M.D.   On: 06/22/2022 16:11    ROS Blood pressure 120/66, pulse 88, temperature 97.6 F (36.4 C), temperature source Oral, resp. rate 14, height 5\' 3"  (1.6 m), weight 56.7 kg, SpO2 90 %. Physical Exam HENT:     Right Ear: External ear normal.     Left Ear: External ear normal.     Nose: Nose normal.     Mouth/Throat:     Mouth: Mucous membranes are moist.  Eyes:     Extraocular Movements: Extraocular movements intact.     Conjunctiva/sclera: Conjunctivae normal.     Pupils: Pupils are equal, round, and reactive to light.     Comments: There is no obvious evidence of significant injury to the eye.  There is no diplopia by examination.  Her face has some ecchymosis.  I cannot appreciate a depression of her left malar region secondary to the zygoma fracture currently.  She has no trismus.  Neurological:     Mental Status: She is alert.  Assessment/Plan: Left orbital/zygoma fracture-I do not see that there is a depression but that may present itself after 1 to 2 weeks of swelling decreased.  The orbital fracture has a spicule of bone that does approximate the left lateral rectus but she does not seem to have any evidence of entrapment/diplopia.  I do not think any intervention is necessary for this problem unless problems develop.  Regarding her zygoma fracture there may be a future depression of the area but it probably will be minimal and likely not worth the risk at 25 for the subtlety of the result but that will be up to her.  I discussed this with her son and explained this exact situation and he understands a follow-up in 1-2 weeks with ENT is the next step to be  sure she does not want anything done.  Call sooner if she begins to have any diplopia or eye complaints.  Follow-up with calling [phone] for the ENT follow-up appointment.  Suzanna Obey 06/23/2022, 10:35 AM

## 2022-06-23 NOTE — Progress Notes (Signed)
Ortho tech paged about [employer].

## 2022-06-23 NOTE — Progress Notes (Signed)
Echocardiogram 2D Echocardiogram has been performed.  Warren Lacy Aurelia Gras RDCS 06/23/2022, 11:01 AM

## 2022-06-23 NOTE — Evaluation (Addendum)
Occupational Therapy Evaluation Patient Details Name: [name] MRN: [id number] DOB: 1927-09-08 Today's Date: 06/23/2022   History of Present Illness 89 y.o. female who presented to the ED 6/5 after a syncopal event at home resulting in multiple injuries. Pt with R wrist fx, L facial fx, and orthostatic hypotension. PMH: CKD stage IIIb, moderate aortic stenosis, HTN, HLD, TIA, esophageal dysmotility.   Clinical Impression   Pt walks with a walker outside of her home and stabilizes on furniture indoors. She is typically independent in ADLs and IADLs. Pt presents with generalized weakness, fatigue, facial and R wrist pain. She requires moderate assistance for bed mobility and attempting to stand, stabilizing LEs on bed with posterior lean. She was not able to step along EOB. Pt is able to self feed and groom in sitting with min assist with L hand. She requires mod to total assist for bathing, dressing and toileting. Pt with SpO2 of 92% on 2L. Anticipate improved ability to participate in ADLs and mobility as her pain improves. Recommending HHOT. Pt's daughter is able to provide 24 hour care of pt and her husband.      Recommendations for follow up therapy are one component of a multi-disciplinary discharge planning process, led by the attending physician.  Recommendations may be updated based on patient status, additional functional criteria and insurance authorization.   Assistance Recommended at Discharge Frequent or constant Supervision/Assistance  Patient can return home with the following A lot of help with walking and/or transfers;A lot of help with bathing/dressing/bathroom;Assistance with cooking/housework;Assistance with feeding;Direct supervision/assist for medications management;Direct supervision/assist for financial management;Assist for transportation;Help with stairs or ramp for entrance    Functional Status Assessment  Patient has had a recent decline in their functional status  and demonstrates the ability to make significant improvements in function in a reasonable and predictable amount of time.  Equipment Recommendations  BSC/3in1    Recommendations for Other Services       Precautions / Restrictions Precautions Precautions: Fall Precaution Comments: watch BP Required Braces or Orthoses: Other Brace Other Brace: thumb spica for R UE Restrictions Weight Bearing Restrictions: Yes RUE Weight Bearing: Weight bear through elbow only      Mobility Bed Mobility Overal bed mobility: Needs Assistance Bed Mobility: Supine to Sit, Sit to Supine     Supine to sit: Mod assist Sit to supine: Mod assist   General bed mobility comments: pt initiating LEs toward EOB, assist to raise trunk and for hips to EOB using bed pad, returned to supine with assist to guide trunk and for LEs back into bed    Transfers Overall transfer level: Needs assistance Equipment used: 1 person hand held assist Transfers: Sit to/from Stand Sit to Stand: Max assist           General transfer comment: assist to rise with pt stabilizing LEs on bed, posterior lean, unable to stand fully upright      Balance Overall balance assessment: Needs assistance   Sitting balance-Leahy Scale: Fair     Standing balance support: Single extremity supported Standing balance-Leahy Scale: Zero                             ADL either performed or assessed with clinical judgement   ADL Overall ADL's : Needs assistance/impaired Eating/Feeding: Minimal assistance;Bed level Eating/Feeding Details (indicate cue type and reason): with L hand Grooming: Wash/dry face;Sitting;Set up Grooming Details (indicate cue type and reason): with L  hand Upper Body Bathing: Moderate assistance;Sitting   Lower Body Bathing: Total assistance;Sit to/from stand   Upper Body Dressing : Moderate assistance;Sitting   Lower Body Dressing: Total assistance;Sit to/from stand                        Vision Ability to See in Adequate Light: 0 Adequate Patient Visual Report: No change from baseline       Perception     Praxis      Pertinent Vitals/Pain Pain Assessment Pain Assessment: Faces Faces Pain Scale: Hurts even more Pain Location: jaw, R wrist Pain Descriptors / Indicators: Grimacing, Guarding, Discomfort Pain Intervention(s): Ice applied (to wrist)     Hand Dominance Right   Extremity/Trunk Assessment Upper Extremity Assessment Upper Extremity Assessment: RUE deficits/detail RUE Deficits / Details: splinted wrist, painful RUE Coordination: decreased fine motor   Lower Extremity Assessment Lower Extremity Assessment: Defer to PT evaluation   Cervical / Trunk Assessment Cervical / Trunk Assessment: Kyphotic   Communication Communication Communication: No difficulties   Cognition Arousal/Alertness: Awake/alert Behavior During Therapy: Flat affect Overall Cognitive Status: Impaired/Different from baseline Area of Impairment: Orientation, Following commands, Safety/judgement, Awareness, Problem solving                 Orientation Level: Disoriented to, Time     Following Commands: Follows one step commands with increased time, Follows multi-step commands with increased time Safety/Judgement: Decreased awareness of safety, Decreased awareness of deficits Awareness: Intellectual Problem Solving: Slow processing, Decreased initiation, Difficulty sequencing, Requires verbal cues General Comments: while eating her lunch, pt attempting to call her daughter on her cell phone to help her eat, unable to use cell phone     General Comments       Exercises     Shoulder Instructions      Home Living Family/patient expects to be discharged to:: Private residence Living Arrangements: Spouse/significant other Available Help at Discharge: Family;Available 24 hours/day (daughter is here staying with husband) Type of Home: House Home Access: Stairs to  enter [employer] of Steps: 2 Entrance Stairs-Rails: Right Home Layout: One level     Bathroom Shower/Tub: Chief Strategy Officer: Standard     Home Equipment: Agricultural consultant (2 wheels);Grab bars - tub/shower          Prior Functioning/Environment Prior Level of Function : Independent/Modified Independent;Driving             Mobility Comments: ambulates with RW outside home, uses furniture for support in home, drives ADLs Comments: indep ADLs/IADLs, and assists husband        OT Problem List: Decreased strength;Decreased activity tolerance;Impaired balance (sitting and/or standing);Decreased cognition;Decreased knowledge of use of DME or AE;Decreased safety awareness;Impaired UE functional use;Pain;Decreased coordination      OT Treatment/Interventions: Self-care/ADL training;DME and/or AE instruction;Therapeutic activities;Cognitive remediation/compensation;Patient/family education;Balance training    OT Goals(Current goals can be found in the care plan section) Acute Rehab OT Goals OT Goal Formulation: With patient Time For Goal Achievement: 07/07/22 Potential to Achieve Goals: Good ADL Goals Pt Will Perform Grooming: standing;with min assist Pt Will Perform Upper Body Dressing: with set-up;sitting Pt Will Perform Lower Body Dressing: with mod assist;sit to/from stand Pt Will Transfer to Toilet: with min assist;ambulating;bedside commode Pt Will Perform Toileting - Clothing Manipulation and hygiene: with min assist;sit to/from stand Additional ADL Goal #1: Pt will complete bed mobility with min assist in preparation for ADLs.  OT Frequency: Min 2X/week    Co-evaluation  AM-PAC OT "6 Clicks" Daily Activity     Outcome Measure Help from another person eating meals?: A Little Help from another person taking care of personal grooming?: A Little Help from another person toileting, which includes using toliet, bedpan, or  urinal?: Total Help from another person bathing (including washing, rinsing, drying)?: A Lot Help from another person to put on and taking off regular upper body clothing?: A Lot Help from another person to put on and taking off regular lower body clothing?: Total 6 Click Score: 12   End of Session Equipment Utilized During Treatment: Gait belt  Activity Tolerance: Patient limited by pain;Patient limited by fatigue Patient left: in bed;with call bell/phone within reach;with bed alarm set;with family/visitor present  OT Visit Diagnosis: Unsteadiness on feet (R26.81);Pain;Muscle weakness (generalized) (M62.81);Other symptoms and signs involving cognitive function Pain - Right/Left: Right Pain - part of body: Arm                Time: 1343-1359 OT Time Calculation (min): 16 min Charges:  OT General Charges $OT Visit: 1 Visit OT Evaluation $OT Eval Moderate Complexity: 1 Mod Berna Spare, OTR/L Acute Rehabilitation Services Office: [phone]  Evern Bio 06/23/2022, 3:28 PM

## 2022-06-23 NOTE — Progress Notes (Signed)
Pt nasal canula removed - pt SpO2 88%.  Oxygen 2L [place] placed on pt - saturations >90%.  Will continue to monitor.

## 2022-06-23 NOTE — Consult Note (Signed)
Orthopaedic Trauma Service (OTS) Consultation   Patient ID: [name] MRN: [id number] DOB/AGE: March 08, 1927 89 y.o.   Reason for Consult:radial styloid fracture, right Referring Physician: Leanna Sato MD EDP  HPI: [name] is a RHD 89 y.o. female who sustained syncope related fall with facial fractures in addition to right wrist injury. Pain is currently well controlled in sugartong splint, aching and dull, sharp and severe with motion, without associated distal tingling or numbness, and improved with narcotics.  On aspirin.  Past Medical History:  Diagnosis Date   Abnormal Pap smear 1975-76   Acute deep vein thrombosis (DVT) of left peroneal vein (HCC)    Anemia    history of   Arthritis    spine   Breast cyst, left 1980   Cystocele 2012   Diverticulosis    with h/o Diverticulitis   DVT, lower extremity, distal, chronic (HCC) 12/24/2018   GERD (gastroesophageal reflux disease)    H/O hypercholesterolemia    H/O osteopenia    H/O varicella    H/O varicose veins    Hx of Mumps    Macular degeneration    Seasonal allergies    TIA (transient ischemic attack)    Urge incontinence 2012   Urinary frequency 2010    Past Surgical History:  Procedure Laterality Date   14-Day Zio Patch  12/2021   Predominantly sinus rhythm open and 47-1:15 PM, average 70 bpm (1  AVB); rare isolated PACs PVCs.  55 atrial runs 4-28 beats fastest heart rate ranges 169 to 176 bpm.  Episodes lasting 1.8 to 15 seconds.  No sustained arrhythmias.  Symptoms not noted with atrial runs.   ABDOMINAL HYSTERECTOMY     BLADDER SUSPENSION N/A 06/16/2016   Procedure: TRANSVAGINAL TAPE (TVT) PROCEDURE;  Surgeon: Osborn Coho, MD;  Location: [facility];  Service: Gynecology;  Laterality: N/A;   BREAST CYST ASPIRATION  1964   COLONOSCOPY     Dr. Kinnie Scales   CT CTA CORONARY W/CA SCORE W/CM &/OR WO/CM  11/2021   1. Mild mixed non-obstructive CAD, CADRADS = 2.   2. Coronary calcium score of 255. This  was 73rd percentile for age and sex matched control (based on an 88 yo).  3. Normal coronary origin with right dominance.   4. Aortic and mitral annular calcification.  5. Aortic atherosclerosis   CYSTOCELE REPAIR N/A 06/16/2016   Procedure: ANTERIOR REPAIR (CYSTOCELE);  Surgeon: Osborn Coho, MD;  Location: [facility];  Service: Gynecology;  Laterality: N/A;   CYSTOSCOPY N/A 06/16/2016   Procedure: CYSTOSCOPY;  Surgeon: Osborn Coho, MD;  Location: [facility];  Service: Gynecology;  Laterality: N/A;  see anterior repair   Lower Extremity Venous Dopplers  01/09/2012   Right and left lower steroids: No evidence of thrombus or, thrombophlebitis; right and left GSV and SSV: No venous insufficiency. Normal exam.   NM MYOVIEW LTD  11/2016   6.6 METS.  Reached 106% of max.  Heart rate.  Walk for 4:40 min.  EF 72%.  Normal blood pressure response.  upsloping ST segment depression, nonspecific.  Otherwise normal study.  LOW RISK.  No evidence of ischemia or infarction.   TRANSTHORACIC ECHOCARDIOGRAM  11/2021   Normal LV Size & Fxn - EF 60-65%. No RWMA. ~ Diastolic Fxn. Degenerative MV - Mod MAC. ~ Mod AS    Family History  Problem Relation Age of Onset   Uterine cancer Mother 14   Heart attack Brother 12   Breast  cancer Daughter    Stroke Neg Hx     Social History:  reports that she has never smoked. She has never been exposed to tobacco smoke. She has never used smokeless tobacco. She reports that she does not drink alcohol and does not use drugs.  Allergies:  Allergies  Allergen Reactions   Atorvastatin Other (See Comments)    Unknown reaction   Desmopressin Acetate Other (See Comments)   Iodinated Contrast Media Hives and Other (See Comments)   Myrbetriq [Mirabegron] Other (See Comments)    "Makes me urinate more frequently"   Iodine Rash    Medications: Prior to Admission:  Medications Prior to Admission  Medication Sig Dispense Refill Last Dose   acetaminophen (TYLENOL) 500 MG tablet  Take 500-1,000 mg by mouth every 8 (eight) hours as needed for mild pain.    unk   albuterol (VENTOLIN HFA) 108 (90 Base) MCG/ACT inhaler Inhale 2 puffs into the lungs every 4 (four) hours as needed for wheezing or shortness of breath.    unk   aspirin EC 81 MG tablet Take 1 tablet (81 mg total) by mouth every evening. Swallow whole. 30 tablet 11 06/21/2022   famotidine (PEPCID) 40 MG tablet Take 40 mg by mouth daily.   06/21/2022   Lactobacillus Rhamnosus, GG, (CULTURELLE) CAPS Take 1 capsule by mouth every morning.    06/21/2022   Magnesium 250 MG TABS Take 250 mg by mouth daily as needed (leg cramps).   unk   metoprolol succinate (TOPROL-XL) 25 MG 24 hr tablet Take 1 tablet (25 mg total) by mouth every evening. Kapspargo sprinkle 90 tablet 3 06/21/2022 at 1800   mirabegron ER (MYRBETRIQ) 50 MG TB24 tablet Take 1 tablet by mouth at bedtime.   06/21/2022   Multiple Vitamins-Minerals (AIRBORNE PO) Take 1 tablet by mouth daily with breakfast.   06/21/2022   Multiple Vitamins-Minerals (CENTRUM SILVER ULTRA WOMENS) TABS Take 1 tablet by mouth daily with breakfast.    06/21/2022   Multiple Vitamins-Minerals (ICAPS AREDS 2 PO) Take 1 capsule by mouth daily at 12 noon.   06/21/2022   Polyvinyl Alcohol-Povidone (REFRESH OP) Place 1 drop into both eyes daily as needed (dry eyes).   06/21/2022   protein supplement shake (PREMIER PROTEIN) LIQD Take 59.1 mLs (2 oz total) by mouth 3 (three) times daily between meals. (Patient taking differently: Take 2 oz by mouth 3 (three) times daily between meals. BOOST) 10000 mL 0 06/21/2022   Tiotropium Bromide Monohydrate (SPIRIVA RESPIMAT) 1.25 MCG/ACT AERS Inhale 2 puffs into the lungs daily. 1 each 11 06/21/2022   Trospium Chloride 60 MG CP24 Take 1 capsule by mouth daily.   06/21/2022   vitamin B-12 (CYANOCOBALAMIN) 100 MCG tablet Take 100 mcg by mouth daily.   06/21/2022   Cholecalciferol (VITAMIN D-3) 25 MCG (1000 UT) CAPS Take 1,000 Units by mouth daily with breakfast.       Nutritional  Supplements (,FEEDING SUPPLEMENT, PROSOURCE PLUS) liquid Take 30 mLs by mouth 2 (two) times daily between meals. (Patient not taking: Reported on 06/22/2022) 887 mL 0 Completed Course    Results for orders placed or performed during the hospital encounter of 06/22/22 (from the past 48 hour(s))  Basic metabolic panel     Status: Abnormal   Collection Time: 06/22/22  3:44 PM  Result Value Ref Range   Sodium 133 (L) 135 - 145 mmol/L   Potassium 4.1 3.5 - 5.1 mmol/L   Chloride 98 98 - 111 mmol/L   CO2  22 22 - 32 mmol/L   Glucose, Bld 140 (H) 70 - 99 mg/dL    Comment: Glucose reference range applies only to samples taken after fasting for at least 8 hours.   BUN 23 8 - 23 mg/dL   Creatinine, Ser 4.13 (H) 0.44 - 1.00 mg/dL   Calcium 9.4 8.9 - 24.4 mg/dL   GFR, Estimated 40 (L) >60 mL/min    Comment: (NOTE) Calculated using the CKD-EPI Creatinine Equation (2021)    Anion gap 13 5 - 15    Comment: Performed at [facility], [address] [place], [place] [address]  CBC     Status: Abnormal   Collection Time: 06/22/22  3:44 PM  Result Value Ref Range   WBC 10.1 4.0 - 10.5 K/uL   RBC 4.46 3.87 - 5.11 MIL/uL   Hemoglobin 13.4 12.0 - 15.0 g/dL   HCT 25.3 66.4 - 40.3 %   MCV 82.7 80.0 - 100.0 fL   MCH 30.0 26.0 - 34.0 pg   MCHC 36.3 (H) 30.0 - 36.0 g/dL   RDW 47.4 25.9 - 56.3 %   Platelets 184 150 - 400 K/uL   nRBC 0.0 0.0 - 0.2 %    Comment: Performed at [facility], [address], [place], [place] [address]  Troponin I (High Sensitivity)     Status: None   Collection Time: 06/22/22  3:44 PM  Result Value Ref Range   Troponin I (High Sensitivity) 15 <18 ng/L    Comment: (NOTE) Elevated high sensitivity troponin I (hsTnI) values and significant  changes across serial measurements may suggest ACS but many other  chronic and acute conditions are known to elevate hsTnI results.  Refer to the "Links" section for chest pain algorithms and additional  guidance. Performed at  [facility], [address], [place], [place] [address]   CBG monitoring, ED     Status: Abnormal   Collection Time: 06/22/22  3:47 PM  Result Value Ref Range   Glucose-Capillary 135 (H) 70 - 99 mg/dL    Comment: Glucose reference range applies only to samples taken after fasting for at least 8 hours.  Troponin I (High Sensitivity)     Status: Abnormal   Collection Time: 06/22/22  6:23 PM  Result Value Ref Range   Troponin I (High Sensitivity) 18 (H) <18 ng/L    Comment: (NOTE) Elevated high sensitivity troponin I (hsTnI) values and significant  changes across serial measurements may suggest ACS but many other  chronic and acute conditions are known to elevate hsTnI results.  Refer to the "Links" section for chest pain algorithms and additional  guidance. Performed at [facility], [address], [place], [place] [address]   Urinalysis, Routine w reflex microscopic -Urine, Clean Catch     Status: Abnormal   Collection Time: 06/22/22  8:47 PM  Result Value Ref Range   Color, Urine YELLOW YELLOW   APPearance CLEAR CLEAR   Specific Gravity, Urine 1.014 1.005 - 1.030   pH 6.0 5.0 - 8.0   Glucose, UA NEGATIVE NEGATIVE mg/dL   Hgb urine dipstick NEGATIVE NEGATIVE   Bilirubin Urine NEGATIVE NEGATIVE   Ketones, ur 5 (A) NEGATIVE mg/dL   Protein, ur 30 (A) NEGATIVE mg/dL   Nitrite NEGATIVE NEGATIVE   Leukocytes,Ua NEGATIVE NEGATIVE   RBC / HPF 0-5 0 - 5 RBC/hpf   WBC, UA 0-5 0 - 5 WBC/hpf   Bacteria, UA NONE SEEN NONE SEEN  Squamous Epithelial / HPF 0-5 0 - 5 /HPF   Mucus PRESENT     Comment: Performed at [facility], [address] [place], [place] [address]  Basic metabolic panel     Status: Abnormal   Collection Time: 06/22/22 11:43 PM  Result Value Ref Range   Sodium 133 (L) 135 - 145 mmol/L   Potassium 4.4 3.5 - 5.1 mmol/L   Chloride 101 98 - 111 mmol/L   CO2 23 22 - 32 mmol/L   Glucose, Bld 142 (H) 70 - 99 mg/dL    Comment: Glucose reference range  applies only to samples taken after fasting for at least 8 hours.   BUN 22 8 - 23 mg/dL   Creatinine, Ser 6.04 (H) 0.44 - 1.00 mg/dL   Calcium 8.5 (L) 8.9 - 10.3 mg/dL   GFR, Estimated 45 (L) >60 mL/min    Comment: (NOTE) Calculated using the CKD-EPI Creatinine Equation (2021)    Anion gap 9 5 - 15    Comment: Performed at [facility], [address], [place], [place] [address]  CBC     Status: Abnormal   Collection Time: 06/22/22 11:43 PM  Result Value Ref Range   WBC 8.1 4.0 - 10.5 K/uL   RBC 3.99 3.87 - 5.11 MIL/uL   Hemoglobin 12.1 12.0 - 15.0 g/dL   HCT 11.9 (L) 14.7 - 82.9 %   MCV 83.7 80.0 - 100.0 fL   MCH 30.3 26.0 - 34.0 pg   MCHC 36.2 (H) 30.0 - 36.0 g/dL   RDW 56.2 13.0 - 86.5 %   Platelets 171 150 - 400 K/uL   nRBC 0.0 0.0 - 0.2 %    Comment: Performed at [facility], [address] [place], [place] [address]  Troponin I (High Sensitivity)     Status: Abnormal   Collection Time: 06/22/22 11:43 PM  Result Value Ref Range   Troponin I (High Sensitivity) 22 (H) <18 ng/L    Comment: (NOTE) Elevated high sensitivity troponin I (hsTnI) values and significant  changes across serial measurements may suggest ACS but many other  chronic and acute conditions are known to elevate hsTnI results.  Refer to the "Links" section for chest pain algorithms and additional  guidance. Performed at [facility], [address], [place], [place] [address]   Glucose, capillary     Status: Abnormal   Collection Time: 06/23/22  7:04 AM  Result Value Ref Range   Glucose-Capillary 106 (H) 70 - 99 mg/dL    Comment: Glucose reference range applies only to samples taken after fasting for at least 8 hours.    ECHOCARDIOGRAM COMPLETE  Result Date: 06/23/2022    ECHOCARDIOGRAM REPORT   Patient Name:   [name] Date of Exam: 06/23/2022 Medical Rec #:  [id number]     Height:       63.0 in Accession #:    [id number]    Weight:       125.0 lb Date of Birth:  [birth year]      BSA:          1.584 m Patient Age:    89 years      BP:           120/66 mmHg Patient Gender: F             HR:           89 bpm. Exam Location:  Inpatient Procedure: 2D Echo,  Color Doppler and Cardiac Doppler Indications:    R55 Syncope  History:        Patient has prior history of Echocardiogram examinations, most                 recent 12/15/2021. Risk Factors:Hypertension and Dyslipidemia.  Sonographer:    Irving Burton Senior RDCS Referring Phys: Charlsie Quest  Sonographer Comments: Suboptimal apical windows due to small rib spaces. IMPRESSIONS  1. Left ventricular ejection fraction, by estimation, is 65 to 70%. The left ventricle has normal function. The left ventricle has no regional wall motion abnormalities. There is mild concentric left ventricular hypertrophy. Left ventricular diastolic parameters are consistent with Grade I diastolic dysfunction (impaired relaxation).  2. Right ventricular systolic function is normal. The right ventricular size is normal. There is normal pulmonary artery systolic pressure.  3. A small pericardial effusion is present. The pericardial effusion is posterior to the left ventricle.  4. The mitral valve is abnormal. Mild mitral valve regurgitation. Mild mitral stenosis. The mean mitral valve gradient is 5.0 mmHg. Moderate mitral annular calcification.  5. Fused left and non cusps. The aortic valve is calcified. There is moderate calcification of the aortic valve. Aortic valve regurgitation is mild. Moderate aortic valve stenosis. Aortic valve area, by VTI measures 1.09 cm. Aortic valve mean gradient measures 21.0 mmHg. Aortic valve Vmax measures 3.22 m/s.  6. The inferior vena cava is normal in size with greater than 50% respiratory variability, suggesting right atrial pressure of 3 mmHg. FINDINGS  Left Ventricle: Left ventricular ejection fraction, by estimation, is 65 to 70%. The left ventricle has normal function. The left ventricle has no regional wall motion abnormalities. The  left ventricular internal cavity size was normal in size. There is  mild concentric left ventricular hypertrophy. Left ventricular diastolic parameters are consistent with Grade I diastolic dysfunction (impaired relaxation). Right Ventricle: The right ventricular size is normal. No increase in right ventricular wall thickness. Right ventricular systolic function is normal. There is normal pulmonary artery systolic pressure. The tricuspid regurgitant velocity is 2.31 m/s, and  with an assumed right atrial pressure of 3 mmHg, the estimated right ventricular systolic pressure is 24.3 mmHg. Left Atrium: Left atrial size was normal in size. Right Atrium: Right atrial size was normal in size. Pericardium: A small pericardial effusion is present. The pericardial effusion is posterior to the left ventricle. Mitral Valve: The mitral valve is abnormal. There is moderate thickening of the mitral valve leaflet(s). There is moderate calcification of the mitral valve leaflet(s). Moderate mitral annular calcification. Mild mitral valve regurgitation. Mild mitral valve stenosis. MV peak gradient, 8.4 mmHg. The mean mitral valve gradient is 5.0 mmHg. Tricuspid Valve: The tricuspid valve is normal in structure. Tricuspid valve regurgitation is mild. Aortic Valve: Fused left and non cusps. The aortic valve is calcified. There is moderate calcification of the aortic valve. Aortic valve regurgitation is mild. Moderate aortic stenosis is present. Aortic valve mean gradient measures 21.0 mmHg. Aortic valve peak gradient measures 41.5 mmHg. Aortic valve area, by VTI measures 1.09 cm. Pulmonic Valve: The pulmonic valve was normal in structure. Pulmonic valve regurgitation is trivial. Aorta: The aortic root and ascending aorta are structurally normal, with no evidence of dilitation. Venous: The inferior vena cava is normal in size with greater than 50% respiratory variability, suggesting right atrial pressure of 3 mmHg. IAS/Shunts: The  interatrial septum was not well visualized.  LEFT VENTRICLE PLAX 2D LVIDd:         3.10  cm   Diastology LVIDs:         1.90 cm   LV e' medial:    5.33 cm/s LV PW:         1.00 cm   LV E/e' medial:  20.8 LV IVS:        1.10 cm   LV e' lateral:   4.90 cm/s LVOT diam:     2.10 cm   LV E/e' lateral: 22.7 LV SV:         68 LV SV Index:   43 LVOT Area:     3.46 cm  RIGHT VENTRICLE RV S prime:     14.70 cm/s TAPSE (M-mode): 2.1 cm LEFT ATRIUM             Index        RIGHT ATRIUM           Index LA diam:        2.70 cm 1.71 cm/m   RA Area:     15.60 cm LA Vol (A2C):   31.0 ml 19.58 ml/m  RA Volume:   41.40 ml  26.14 ml/m LA Vol (A4C):   42.7 ml 26.97 ml/m LA Biplane Vol: 36.4 ml 22.99 ml/m  AORTIC VALVE AV Area (Vmax):    1.10 cm AV Area (Vmean):   1.27 cm AV Area (VTI):     1.09 cm AV Vmax:           322.00 cm/s AV Vmean:          204.000 cm/s AV VTI:            0.628 m AV Peak Grad:      41.5 mmHg AV Mean Grad:      21.0 mmHg LVOT Vmax:         102.00 cm/s LVOT Vmean:        75.000 cm/s LVOT VTI:          0.197 m LVOT/AV VTI ratio: 0.31  AORTA Ao Root diam: 3.00 cm Ao Asc diam:  3.00 cm MITRAL VALVE                TRICUSPID VALVE MV Area (PHT): 1.54 cm     TR Peak grad:   21.3 mmHg MV Peak grad:  8.4 mmHg     TR Vmax:        231.00 cm/s MV Mean grad:  5.0 mmHg MV Vmax:       1.45 m/s     SHUNTS MV Vmean:      103.0 cm/s   Systemic VTI:  0.20 m MV Decel Time: 494 msec     Systemic Diam: 2.10 cm MV E velocity: 111.00 cm/s MV A velocity: 144.00 cm/s MV E/A ratio:  0.77 Charlton Haws MD Electronically signed by Charlton Haws MD Signature Date/Time: 06/23/2022/11:49:04 AM    Final    CT CERVICAL SPINE WO CONTRAST  Result Date: 06/22/2022 CLINICAL DATA:  fall with facial fractures, rule out C spine injury EXAM: CT CERVICAL SPINE WITHOUT CONTRAST TECHNIQUE: Multidetector CT imaging of the cervical spine was performed without intravenous contrast. Multiplanar CT image reconstructions were also generated. RADIATION DOSE  REDUCTION: This exam was performed according to the departmental dose-optimization program which includes automated exposure control, adjustment of the mA and/or kV according to patient size and/or use of iterative reconstruction technique. COMPARISON:  None Available. FINDINGS: Alignment: Normal Skull base and vertebrae: No acute fracture. No primary bone lesion or focal pathologic process. Soft  tissues and spinal canal: No prevertebral fluid or swelling. No visible canal hematoma. Disc levels: Mild bilateral degenerative facet disease and diffuse degenerative disc disease. Congenital fusion across the C4-5 disc space. No visible disc herniation. Upper chest: No acute findings Other: None IMPRESSION: No acute bony abnormality. Electronically Signed   By: Charlett Nose M.D.   On: 06/22/2022 23:23   DG Chest 1 View  Result Date: 06/22/2022 CLINICAL DATA:  Syncopal episode and fall today. EXAM: CHEST  1 VIEW COMPARISON:  10/18/2021 FINDINGS: Heart size and pulmonary vascularity are normal. Linear fibrosis or atelectasis in the lung bases, similar to prior study. No airspace disease or consolidation. No pleural effusions. No pneumothorax. Mediastinal contours appear intact. Calcification of the aorta. Degenerative changes in the spine and shoulders. IMPRESSION: Linear atelectasis or fibrosis in the lung bases. No evidence of active pulmonary disease. Electronically Signed   By: Burman Nieves M.D.   On: 06/22/2022 17:58   DG Hip Unilat W or Wo Pelvis 2-3 Views Right  Result Date: 06/22/2022 CLINICAL DATA:  Right hip pain after syncopal episode and fall today. EXAM: DG HIP (WITH OR WITHOUT PELVIS) 2-3V RIGHT COMPARISON:  None Available. FINDINGS: Mild degenerative changes in the hips. Pelvis and right hip appear intact. No evidence of acute fracture or dislocation. No focal bone lesion or bone destruction. SI joints and symphysis pubis are not displaced. IMPRESSION: Degenerative changes in the right hip. No acute  displaced fractures identified. Electronically Signed   By: Burman Nieves M.D.   On: 06/22/2022 17:57   CT Maxillofacial Wo Contrast  Result Date: 06/22/2022 CLINICAL DATA:  Blunt facial trauma with left zygomatic arch pain and ecchymosis. Left jaw pain. Syncope or presyncope. EXAM: CT MAXILLOFACIAL WITHOUT CONTRAST TECHNIQUE: Multidetector CT imaging of the maxillofacial structures was performed. Multiplanar CT image reconstructions were also generated. RADIATION DOSE REDUCTION: This exam was performed according to the departmental dose-optimization program which includes automated exposure control, adjustment of the mA and/or kV according to patient size and/or use of iterative reconstruction technique. COMPARISON:  CT head 06/01/2021 FINDINGS: Osseous: Acute comminuted and depressed fractures of the zygomatic arch with fracture lines extending to the temporomandibular joint surface. Acute comminuted and depressed fractures of the lateral left orbital wall with displacement of a tiny fragment into the extraconal intraorbital space, partially displacing the rectus lateralis muscle. Mildly depressed fracture of the posterior aspect of the inferior orbital wall with gas demonstrated in the pterygoid canal. Comminuted and depressed fractures of the lateral left maxillary antral wall extending to the upper maxilla. The mandible appears intact. Degenerative changes in the temporomandibular joints. Orbits: The globes and extraocular muscles appear intact and symmetrical. Medial displacement of the left lateral rectus muscle as above. Sinuses: Air-fluid level in the left maxillary antrum. Soft tissues: Soft tissue infiltration consistent with contusion or hematoma in the lateral aspect of the left orbital and facial region centered mostly around the zygomatic arch fractures. Limited intracranial: See additional report of CT head same date. IMPRESSION: 1. Multiple acute and depressed/displaced left orbital and facial  fractures as discussed, including comminuted zygomatic arch fractures extending to the temporomandibular joint surface, a lateral orbital fracture with displaced fragment causing medial displacement of the lateral rectus muscle, and orbital/maxillary antral fractures extending to the pterygoid canal with gas in the canal. 2. Associated posttraumatic air-fluid level in the left maxillary antrum. 1. Soft tissue contusion over the left side of the face and zygomatic region. Electronically Signed   By: Burman Nieves  M.D.   On: 06/22/2022 17:56   CT HEAD WO CONTRAST  Result Date: 06/22/2022 CLINICAL DATA:  Syncope or presyncope with cerebrovascular cause suspected. Facial trauma. Left jaw pain. EXAM: CT HEAD WITHOUT CONTRAST TECHNIQUE: Contiguous axial images were obtained from the base of the skull through the vertex without intravenous contrast. RADIATION DOSE REDUCTION: This exam was performed according to the departmental dose-optimization program which includes automated exposure control, adjustment of the mA and/or kV according to patient size and/or use of iterative reconstruction technique. COMPARISON:  MRI brain 10/19/2021.  CT head 06/01/2021 FINDINGS: Brain: Diffuse cerebral atrophy. Ventricular dilatation consistent with central atrophy. Low-attenuation changes in the deep white matter consistent with small vessel ischemia. No abnormal extra-axial fluid collections. No mass effect or midline shift. Gray-white matter junctions are distinct. Basal cisterns are not effaced. No acute intracranial hemorrhage. Vascular: No hyperdense vessel or unexpected calcification. Skull: Normal. Negative for fracture or focal lesion. Sinuses/Orbits: Air-fluid level in the left maxillary antrum with multiple left facial fractures. See additional report of CT maxillofacial. Mastoid air cells are clear. Other: None. IMPRESSION: 1. No acute intracranial abnormalities. Chronic atrophy and small vessel ischemic changes. 2.  Left facial fractures with air-fluid level in the left maxillary antrum. See additional report of CT maxillofacial same date. Electronically Signed   By: Burman Nieves M.D.   On: 06/22/2022 17:37   DG Wrist Complete Right  Result Date: 06/22/2022 CLINICAL DATA:  Pain after fall EXAM: RIGHT WRIST - COMPLETE 4 VIEW COMPARISON:  None Available. FINDINGS: Nondisplaced fracture of the extreme distal radius involving the radiocarpal joint and the radial styloid. No additional fracture or dislocation. Osteopenia. Chondrocalcinosis. Soft tissue swelling. IMPRESSION: Nondisplaced distal radial fracture involving the base of the radial styloid. Fracture line extends to the radiocarpal joint. Chondrocalcinosis and soft tissue swelling. Electronically Signed   By: Karen Kays M.D.   On: 06/22/2022 16:11    Intake/Output      06/05 0701 06/06 0700 06/06 0701 06/07 0700   P.O.  480   IV Piggyback 1000    Total Intake(mL/kg) 1000 (17.6) 480 (8.5)   Urine (mL/kg/hr)  0 (0)   Total Output  0   Net +1000 +480           ROS as above Blood pressure (!) 110/55, pulse 94, temperature 98 F (36.7 C), temperature source Oral, resp. rate (!) 22, height 5\' 3"  (1.6 m), weight 56.7 kg, SpO2 96 %. Physical Exam Facial swelling but pleasant and answering all questions appropriately RUEx   Splint in place; tender wrist and nontender elbow and digits  Sens  Ax/R/M/U intact  Mot   Ax/ R/ PIN/ M/ AIN/ U intact  Brisk CR  Shoulder NT, no blocks to motion LUEx shoulder, elbow, wrist, digits- no skin wounds, nontender, no instability, no blocks to motion  Sens  Ax/R/M/U intact  Mot   Ax/ R/ PIN/ M/ AIN/ U intact  Rad 2+ LLE No traumatic wounds, ecchymosis, or rash  Nontender  No knee or ankle effusion  Sens DPN, SPN, TN intact  Motor EHL, ext, flex, evers 5/5  PT 2+, No significant edema  No pain with axial loading or hip rotation RLE No traumatic wounds, ecchymosis, or rash  Nontender  No knee or ankle  effusion  Sens DPN, SPN, TN intact  Motor EHL, ext, flex, evers 5/5  PT 2+, No significant edema  No pain with axial loading or hip rotation        Gait:  could not observe    Assessment/Plan: Right dominant arm radial styloid fracture Fall with bilateral hand pain Facial fractures Syncope  Will go ahead and transition to off the shelf thumb spica splint; leave in place and treat like a splint. WBAT through the elbow F/u in office Wednesday this or next for reassessment and new films  Myrene Galas, MD [facility], [facility] [phone]  06/23/2022, 12:20 PM  [facility] [address] [place] [address] [phone] [phone] (F)    After 5pm and on the weekends please log on to Amion, go to orthopaedics and the look under the [facility] Call for the provider(s) on call. You can also call our office at [phone] and then follow the prompts to be connected to the call team.

## 2022-06-23 NOTE — TOC Initial Note (Signed)
Transition of Care (TOC) - Initial/Assessment Note  Donn Pierini RN, BSN Transitions of Care Unit 4E- RN Case Manager See Treatment Team for direct phone #   Patient Details  Name: [name] MRN: [id number] Date of Birth: 1928/01/17  Transition of Care [facility]) CM/SW Contact:    Darrold Span, RN Phone Number: 06/23/2022, 1:13 PM  Clinical Narrative:                 Pt from home w/ spouse, CM spoke with pt at bedside- pt confirmed she has had [facility] in past and would like to use them again for needed [facility]- also confirmed she would like [facility] to use a night. Has RW and shower chair at home.   Family would transport home,  TOC to follow for [facility] and DME referrals- need orders for [facility] and DME.   Expected Discharge Plan: Home w Home Health Services Barriers to Discharge: Continued Medical Work up   Patient Goals and CMS Choice Patient states their goals for this hospitalization and ongoing recovery are:: return home CMS Medicare.gov Compare Post Acute Care list provided to:: Patient Choice offered to / list presented to : Patient      Expected Discharge Plan and Services   Discharge Planning Services: CM Consult Post Acute Care Choice: Durable Medical Equipment, Home Health Living arrangements for the past 2 months: Single Family Home                 DME Arranged: Bedside commode           HH Agency: [facility]        Prior Living Arrangements/Services Living arrangements for the past 2 months: Single Family Home Lives with:: Spouse              Current home services: DME (RW, shower chair)    Activities of Daily Living      Permission Sought/Granted                  Emotional Assessment              Admission diagnosis:  Urinary retention [R33.9] Syncope [R55] AKI (acute kidney injury) (HCC) [N17.9] Injury of head, initial encounter [S09.90XA] Fall, initial encounter [W19.XXXA] Closed fracture of left orbit, initial  encounter (HCC) [S02.85XA] Syncope, unspecified syncope type [R55] Closed fracture of left zygomatic arch, initial encounter (HCC) [S02.40FA] Closed fracture of distal end of left radius, unspecified fracture morphology, initial encounter [S52.502A] Patient Active Problem List   Diagnosis Date Noted   Syncope 06/22/2022   Fracture of other specified skull and facial bones, left side, initial encounter for closed fracture (HCC) 06/22/2022   Distal radial fracture 06/22/2022   Chronic kidney disease, stage 3b (HCC) 06/22/2022   Elevated troponin 06/22/2022   Acute urinary retention 06/22/2022   Dizziness, nonspecific 11/22/2021   Esophageal dysmotility 10/23/2021   Precordial pain 10/19/2021   Dyslipidemia 10/19/2021   History of TIA (transient ischemic attack) 10/19/2021   Vertigo 10/19/2021   Abnormal weight loss 10/19/2021   History of DVT of lower extremity 04/04/2021   Chronic kidney disease, stage 3a (HCC) 04/04/2021   AKI (acute kidney injury) (HCC) 04/04/2021   Unintentional weight loss 04/04/2021   Dyslipidemia, goal LDL below 100 04/12/2020   Palpitations 05/30/2018   Irregular heartbeat 03/02/2018   TIA (transient ischemic attack) 02/15/2018   Urinary, incontinence, stress female 06/16/2016   Right carotid bruit 12/23/2014   DOE (dyspnea on exertion) 12/24/2013  Bilateral arm numbness and tingling while sleeping - and shortly after waking 12/24/2013   Poor balance 12/24/2013   Edema of left lower extremity 12/21/2012   Essential hypertension    H/O hypercholesterolemia    Osteoporosis - of the spine 09/22/2011   PCP:  Thana Ates, MD Pharmacy:   [facility] [phone] Ginette Otto, [place] - [address] [place] [address] Phone: [phone] Fax: [phone]     Social Determinants of Health (SDOH) Social History: SDOH Screenings   Tobacco Use: Low Risk  (05/09/2022)   SDOH Interventions:     Readmission Risk  Interventions     No data to display

## 2022-06-23 NOTE — Progress Notes (Signed)
Orthopedic Tech Progress Note Patient Details:  [name] 1927/03/25 [id number]  Removed sugartong splint. Then put on thumb spica. Ortho Devices Type of Ortho Device: Thumb velcro splint Ortho Device/Splint Location: RUE Ortho Device/Splint Interventions: Ordered, Application, Adjustment, Removal (Sugartong splint removed per order from PA Montez Morita)   Post Interventions Patient Tolerated: Well Instructions Provided: Adjustment of device, Care of device  Sherilyn Banker 06/23/2022, 3:58 PM

## 2022-06-23 NOTE — Final Consult Note (Signed)
Patient ID: [name] [birth year]  Admit date: 06/22/2022 Admitting Diagnoses: GLF after syncopal episode, left facial fractures, right radius fracture.   Tertiary Survey: Trauma scans reviewed: Yes Additional Imaging recommendations: No, c-spine ordered last night negative Labs reviewed: Yes Additional lab work recommendations: No  Does the patient have any new complaints? Yes: trouble focusing her vision Cervical Collar Cleared: Yes DVT ppx ordered? No. But I recommend lovenox 30 BID if ok with medical team. Note: dosing for DVT prophylaxis in the setting of trauma and normal renal function is 30mg  BID or 40mg  BID if >100kg or BMI>35 Is patient age > 87 (89 y.o.) : Yes  Exam: General appearance: cooperative, no distress, and somnolent but easily arouses to voice and touch HEENT: facial swelling over left side of face, some ecchymosis, pupils are equal, round reactive, no double vision, no vision loss CV: RRR Pulm: normal effort on [place] Abd: non-tender, no guarding, no hernias MSK: RUE splinted, fingers WWP/NVI, LUE without deformity, BLE without deformity, able to perform active flexion/extension of the ankle, knee, hips without pain. No large hematomas identified   Incidental Findings: none  Wound Care: none  Follow-up Appointments: No trauma follow up inidcated. Should be seen by ENT and ortho today and will need follow up with both. Trauma will sign off. Call as needed.    Hosie Spangle, PA-C [facility] Please see Amion for pager number during day hours 7:00am-4:30pm

## 2022-06-23 NOTE — Progress Notes (Signed)
Flutter valve and IS given to pt - education provided - pt verbalized and demonstrated understanding.  Will need reinforcement.  Daughter at bedside and agrees to continue reinforcing use.

## 2022-06-23 NOTE — Care Management Obs Status (Signed)
MEDICARE OBSERVATION STATUS NOTIFICATION   Patient Details  Name: [name] MRN: [id number] Date of Birth: 07/27/1927   Medicare Observation Status Notification Given:  Yes    Darrold Span, RN 06/23/2022, 1:10 PM

## 2022-06-23 NOTE — Progress Notes (Signed)
PROGRESS NOTE    [name]  ZOX:[id number] DOB: [birth year] DOA: 06/22/2022 PCP: Thana Ates, MD   Brief Narrative: 89 year old with past medical history significant for CKD stage IIIb, aortic stenosis pretension, hyperlipidemia, TIA, esophageal dysmotility who presents to the ED for evaluation after syncopal event resulting in multiple injuries.  Patient reports  feeling faint and lightheaded the morning of admission, she was walking in her living room when she suddenly lost consciousness and fell.  Evaluation in the ED: Imaging right wrist show nondisplaced distal radial fracture involving the base of the radial styloid.  Fracture line extending to the radiocarpal joint.  CT head negative for acute intracranial abnormality.  Left facial fracture with air-fluid level in the left maxillary antrum.  CT maxillofacial: Multiple acute and depressed/displaced left orbital and facial fractures,  comminuted zygomatic arch fractures extending to the temporomandibular joint surface, a lateral orbital fracture with displaced fragment causing medial displacement of the lateral rectus muscle, and orbital/maxillary antral fractures extending to the pterygoid canal with gas in the canal. Soft tissue contusion over the left side of the face and zygomatic region.    Assessment & Plan:   Principal Problem:   Syncope Active Problems:   Essential hypertension   Fracture of other specified skull and facial bones, left side, initial encounter for closed fracture (HCC)   Distal radial fracture   Chronic kidney disease, stage 3b (HCC)   Elevated troponin   Acute urinary retention   1-Syncope:  Unclear etiology Continue to monitor on Telemetry.  Troponin not significantly elevated.  No significant electrolytes abnormalities.  ECHO ordered.  Orthostatic Vitals.    2-Left comminuted zygomatic arch fracture Left lateral orbital fracture Orbital/maxillary antral fractures: Appreciate Dr Jearld Fenton  evaluation.  Patient will need to follow up in the office in 1-2 weeks.  Monitor for any diplopia or eye complaint. If develops any-- will need to contact Dr Jearld Fenton.  For Zygoma fracture he doesn't see any depression, but maybe present itself after 1 or 2 weeks swelling decreased.   3-Distal right radial fracture:  Ortho planning on transition to off the shelf thumb spica splint treat like splint.  WBAT through the elbow.   CKD stage IIIb Stable  Monitor.   Urinary retention:  Foley catheter placed in the ED.  Will need voiding trial.   Hypertension: Continue with Toprolol.  Elevation troponin, mild likely demand.    Estimated body mass index is 22.14 kg/m as calculated from the following:   Height as of this encounter: 5\' 3"  (1.6 m).   Weight as of this encounter: 56.7 kg.   DVT prophylaxis: SCD Code Status: Full cdoe Family Communication: Disposition Plan:  Status is: Observation The patient remains OBS appropriate and will d/c before 2 midnights.    Consultants:  Ortho ENT   Procedures:    Antimicrobials:    Subjective: She is alert, report facial pain.   Objective: Vitals:   06/22/22 2300 06/22/22 2314 06/22/22 2336 06/23/22 0300  BP: (!) 143/84  (!) 162/87 112/66  Pulse: (!) 103  98 99  Resp: 11  20 15   Temp:  (!) 97.5 F (36.4 C) 97.7 F (36.5 C) 97.8 F (36.6 C)  TempSrc:  Oral Oral Oral  SpO2: 96%  96% 92%  Weight:      Height:        Intake/Output Summary (Last 24 hours) at 06/23/2022 0723 Last data filed at 06/22/2022 2158 Gross per 24 hour  Intake 1000 ml  Output --  Net 1000 ml   Filed Weights   06/22/22 1542  Weight: 56.7 kg    Examination:  General exam: Appears calm and comfortable , face left side with edema, hematoma.  Respiratory system: Clear to auscultation. Respiratory effort normal. Cardiovascular system: S1 & S2 heard, RRR.  Gastrointestinal system: Abdomen is nondistended, soft and nontender. No organomegaly or  masses felt. Normal bowel sounds heard. Central nervous system: Alert and oriented. Extremities: Symmetric 5 x 5 power.    Data Reviewed: I have personally reviewed following labs and imaging studies  CBC: Recent Labs  Lab 06/22/22 1544 06/22/22 2343  WBC 10.1 8.1  HGB 13.4 12.1  HCT 36.9 33.4*  MCV 82.7 83.7  PLT 184 171   Basic Metabolic Panel: Recent Labs  Lab 06/22/22 1544 06/22/22 2343  NA 133* 133*  K 4.1 4.4  CL 98 101  CO2 22 23  GLUCOSE 140* 142*  BUN 23 22  CREATININE 1.27* 1.15*  CALCIUM 9.4 8.5*   GFR: Estimated Creatinine Clearance: 25.8 mL/min (A) (by C-G formula based on SCr of 1.15 mg/dL (H)). Liver Function Tests: No results for input(s): "AST", "ALT", "ALKPHOS", "BILITOT", "PROT", "ALBUMIN" in the last 168 hours. No results for input(s): "LIPASE", "AMYLASE" in the last 168 hours. No results for input(s): "AMMONIA" in the last 168 hours. Coagulation Profile: No results for input(s): "INR", "PROTIME" in the last 168 hours. Cardiac Enzymes: No results for input(s): "CKTOTAL", "CKMB", "CKMBINDEX", "TROPONINI" in the last 168 hours. BNP (last 3 results) No results for input(s): "PROBNP" in the last 8760 hours. HbA1C: No results for input(s): "HGBA1C" in the last 72 hours. CBG: Recent Labs  Lab 06/22/22 1547 06/23/22 0704  GLUCAP 135* 106*   Lipid Profile: No results for input(s): "CHOL", "HDL", "LDLCALC", "TRIG", "CHOLHDL", "LDLDIRECT" in the last 72 hours. Thyroid Function Tests: No results for input(s): "TSH", "T4TOTAL", "FREET4", "T3FREE", "THYROIDAB" in the last 72 hours. Anemia Panel: No results for input(s): "VITAMINB12", "FOLATE", "FERRITIN", "TIBC", "IRON", "RETICCTPCT" in the last 72 hours. Sepsis Labs: No results for input(s): "PROCALCITON", "LATICACIDVEN" in the last 168 hours.  No results found for this or any previous visit (from the past 240 hour(s)).       Radiology Studies: CT CERVICAL SPINE WO CONTRAST  Result Date:  06/22/2022 CLINICAL DATA:  fall with facial fractures, rule out C spine injury EXAM: CT CERVICAL SPINE WITHOUT CONTRAST TECHNIQUE: Multidetector CT imaging of the cervical spine was performed without intravenous contrast. Multiplanar CT image reconstructions were also generated. RADIATION DOSE REDUCTION: This exam was performed according to the departmental dose-optimization program which includes automated exposure control, adjustment of the mA and/or kV according to patient size and/or use of iterative reconstruction technique. COMPARISON:  None Available. FINDINGS: Alignment: Normal Skull base and vertebrae: No acute fracture. No primary bone lesion or focal pathologic process. Soft tissues and spinal canal: No prevertebral fluid or swelling. No visible canal hematoma. Disc levels: Mild bilateral degenerative facet disease and diffuse degenerative disc disease. Congenital fusion across the C4-5 disc space. No visible disc herniation. Upper chest: No acute findings Other: None IMPRESSION: No acute bony abnormality. Electronically Signed   By: Charlett Nose M.D.   On: 06/22/2022 23:23   DG Chest 1 View  Result Date: 06/22/2022 CLINICAL DATA:  Syncopal episode and fall today. EXAM: CHEST  1 VIEW COMPARISON:  10/18/2021 FINDINGS: Heart size and pulmonary vascularity are normal. Linear fibrosis or atelectasis in the lung bases, similar to prior study. No airspace disease or consolidation.  No pleural effusions. No pneumothorax. Mediastinal contours appear intact. Calcification of the aorta. Degenerative changes in the spine and shoulders. IMPRESSION: Linear atelectasis or fibrosis in the lung bases. No evidence of active pulmonary disease. Electronically Signed   By: Burman Nieves M.D.   On: 06/22/2022 17:58   DG Hip Unilat W or Wo Pelvis 2-3 Views Right  Result Date: 06/22/2022 CLINICAL DATA:  Right hip pain after syncopal episode and fall today. EXAM: DG HIP (WITH OR WITHOUT PELVIS) 2-3V RIGHT COMPARISON:   None Available. FINDINGS: Mild degenerative changes in the hips. Pelvis and right hip appear intact. No evidence of acute fracture or dislocation. No focal bone lesion or bone destruction. SI joints and symphysis pubis are not displaced. IMPRESSION: Degenerative changes in the right hip. No acute displaced fractures identified. Electronically Signed   By: Burman Nieves M.D.   On: 06/22/2022 17:57   CT Maxillofacial Wo Contrast  Result Date: 06/22/2022 CLINICAL DATA:  Blunt facial trauma with left zygomatic arch pain and ecchymosis. Left jaw pain. Syncope or presyncope. EXAM: CT MAXILLOFACIAL WITHOUT CONTRAST TECHNIQUE: Multidetector CT imaging of the maxillofacial structures was performed. Multiplanar CT image reconstructions were also generated. RADIATION DOSE REDUCTION: This exam was performed according to the departmental dose-optimization program which includes automated exposure control, adjustment of the mA and/or kV according to patient size and/or use of iterative reconstruction technique. COMPARISON:  CT head 06/01/2021 FINDINGS: Osseous: Acute comminuted and depressed fractures of the zygomatic arch with fracture lines extending to the temporomandibular joint surface. Acute comminuted and depressed fractures of the lateral left orbital wall with displacement of a tiny fragment into the extraconal intraorbital space, partially displacing the rectus lateralis muscle. Mildly depressed fracture of the posterior aspect of the inferior orbital wall with gas demonstrated in the pterygoid canal. Comminuted and depressed fractures of the lateral left maxillary antral wall extending to the upper maxilla. The mandible appears intact. Degenerative changes in the temporomandibular joints. Orbits: The globes and extraocular muscles appear intact and symmetrical. Medial displacement of the left lateral rectus muscle as above. Sinuses: Air-fluid level in the left maxillary antrum. Soft tissues: Soft tissue  infiltration consistent with contusion or hematoma in the lateral aspect of the left orbital and facial region centered mostly around the zygomatic arch fractures. Limited intracranial: See additional report of CT head same date. IMPRESSION: 1. Multiple acute and depressed/displaced left orbital and facial fractures as discussed, including comminuted zygomatic arch fractures extending to the temporomandibular joint surface, a lateral orbital fracture with displaced fragment causing medial displacement of the lateral rectus muscle, and orbital/maxillary antral fractures extending to the pterygoid canal with gas in the canal. 2. Associated posttraumatic air-fluid level in the left maxillary antrum. 1. Soft tissue contusion over the left side of the face and zygomatic region. Electronically Signed   By: Burman Nieves M.D.   On: 06/22/2022 17:56   CT HEAD WO CONTRAST  Result Date: 06/22/2022 CLINICAL DATA:  Syncope or presyncope with cerebrovascular cause suspected. Facial trauma. Left jaw pain. EXAM: CT HEAD WITHOUT CONTRAST TECHNIQUE: Contiguous axial images were obtained from the base of the skull through the vertex without intravenous contrast. RADIATION DOSE REDUCTION: This exam was performed according to the departmental dose-optimization program which includes automated exposure control, adjustment of the mA and/or kV according to patient size and/or use of iterative reconstruction technique. COMPARISON:  MRI brain 10/19/2021.  CT head 06/01/2021 FINDINGS: Brain: Diffuse cerebral atrophy. Ventricular dilatation consistent with central atrophy. Low-attenuation changes in the deep  white matter consistent with small vessel ischemia. No abnormal extra-axial fluid collections. No mass effect or midline shift. Gray-white matter junctions are distinct. Basal cisterns are not effaced. No acute intracranial hemorrhage. Vascular: No hyperdense vessel or unexpected calcification. Skull: Normal. Negative for fracture  or focal lesion. Sinuses/Orbits: Air-fluid level in the left maxillary antrum with multiple left facial fractures. See additional report of CT maxillofacial. Mastoid air cells are clear. Other: None. IMPRESSION: 1. No acute intracranial abnormalities. Chronic atrophy and small vessel ischemic changes. 2. Left facial fractures with air-fluid level in the left maxillary antrum. See additional report of CT maxillofacial same date. Electronically Signed   By: Burman Nieves M.D.   On: 06/22/2022 17:37   DG Wrist Complete Right  Result Date: 06/22/2022 CLINICAL DATA:  Pain after fall EXAM: RIGHT WRIST - COMPLETE 4 VIEW COMPARISON:  None Available. FINDINGS: Nondisplaced fracture of the extreme distal radius involving the radiocarpal joint and the radial styloid. No additional fracture or dislocation. Osteopenia. Chondrocalcinosis. Soft tissue swelling. IMPRESSION: Nondisplaced distal radial fracture involving the base of the radial styloid. Fracture line extends to the radiocarpal joint. Chondrocalcinosis and soft tissue swelling. Electronically Signed   By: Karen Kays M.D.   On: 06/22/2022 16:11        Scheduled Meds:  metoprolol succinate  25 mg Oral QPM   sodium chloride flush  3 mL Intravenous Q12H   Continuous Infusions:   LOS: 0 days    Time spent: 35 minutes    Dewan Emond A Kasen Sako, MD [facility]   If 7PM-7AM, please contact night-coverage [email or web address]  06/23/2022, 7:23 AM

## 2022-06-23 NOTE — Evaluation (Signed)
Physical Therapy Evaluation Patient Details Name: [name] MRN: [id number] DOB: [birth year] Today's Date: 06/23/2022  History of Present Illness  89 y.o. female who presented to the ED 6/5 after a syncopal event at home resulting in multiple injuries. Pt with R wrist fx, L facilal fx, and orthostatic hypotension. PMH: CKD stage IIIb, moderate aortic stenosis, HTN, HLD, TIA, esophageal dysmotility  Clinical Impression  PTA pt living with husband in single story home with 2 steps to enter, using RW for limited community ambulation, UE support from furniture for mobility in the home, independent with ADLs and iADLs, driving and providing care for husband. Pt is currently limited in safe mobility by orthostatic hypotension, increased O2 demand, pain in R wrist and L face in presence of decreased balance and generalized weakness. Pt requiring modA for power up to standing and maxA for stabilization in standing while taking blood pressure measurements in standing. Pt will need acute and post acute PT services.     Orthostatic BPs                                                        BP                       HR Sitting 106/52 99  Standing 86/51 124  Unable to stand 3 min, immediately on sitting  118/55 96  Sitting end of session  108/51 96         Recommendations for follow up therapy are one component of a multi-disciplinary discharge planning process, led by the attending physician.  Recommendations may be updated based on patient status, additional functional criteria and insurance authorization.     Assistance Recommended at Discharge Frequent or constant Supervision/Assistance  Patient can return home with the following  A lot of help with walking and/or transfers;A little help with bathing/dressing/bathroom;Assistance with cooking/housework;Assistance with feeding;Direct supervision/assist for medications management;Direct supervision/assist for financial management;Assist for  transportation;Help with stairs or ramp for entrance    Equipment Recommendations BSC/3in1  Recommendations for Other Services  OT consult    Functional Status Assessment Patient has had a recent decline in their functional status and demonstrates the ability to make significant improvements in function in a reasonable and predictable amount of time.     Precautions / Restrictions Precautions Precautions: Fall Precaution Comments: admitted due to syncope and Restrictions Weight Bearing Restrictions: Yes RUE Weight Bearing: Weight bear through elbow only      Mobility  Bed Mobility               General bed mobility comments: up in recliner    Transfers Overall transfer level: Needs assistance Equipment used: 1 person hand held assist Transfers: Sit to/from Stand Sit to Stand: Mod assist, Total assist           General transfer comment: modA for power up to standing, unable to attain or maintain balance, due to increased posterior lean and bracing her legs posteriorly on recliner    Ambulation/Gait               General Gait Details: unable due to dizziness and drop in BP        Balance Overall balance assessment: Needs assistance Sitting-balance support: No upper extremity supported, Feet supported Sitting balance-Leahy Scale: Good  Standing balance support: Bilateral upper extremity supported, During functional activity, Reliant on assistive device for balance Standing balance-Leahy Scale: Zero                               Pertinent Vitals/Pain Pain Assessment Pain Assessment: Faces Faces Pain Scale: Hurts even more Pain Location: jaw with chewing Pain Descriptors / Indicators: Grimacing, Guarding Pain Intervention(s): Limited activity within patient's tolerance, Monitored during session, Repositioned    Home Living Family/patient expects to be discharged to:: Private residence Living Arrangements: Spouse/significant  other Available Help at Discharge: Family;Available 24 hours/day (daughter lives in [place] but can be there at discharge 24/7) Type of Home: House Home Access: Stairs to enter Entrance Stairs-Rails: Right Entrance Stairs-Number of Steps: 2   Home Layout: One level Home Equipment: Agricultural consultant (2 wheels);Grab bars - tub/shower      Prior Function Prior Level of Function : Independent/Modified Independent;Driving             Mobility Comments: ambulates with RW outside home, uses furniture for support in home, drives ADLs Comments: indep ADLs/IADLs, and asissts husband        Extremity/Trunk Assessment   Upper Extremity Assessment Upper Extremity Assessment: Defer to OT evaluation    Lower Extremity Assessment Lower Extremity Assessment: Generalized weakness    Cervical / Trunk Assessment Cervical / Trunk Assessment: Kyphotic  Communication   Communication: Expressive difficulties (sore jaw from facial fx)  Cognition Arousal/Alertness: Lethargic, Awake/alert Behavior During Therapy: Flat affect Overall Cognitive Status: Impaired/Different from baseline Area of Impairment: Orientation, Following commands, Safety/judgement, Awareness, Problem solving                 Orientation Level: Disoriented to, Time     Following Commands: Follows one step commands with increased time, Follows multi-step commands with increased time Safety/Judgement: Decreased awareness of safety Awareness: Emergent Problem Solving: Slow processing, Requires verbal cues, Requires tactile cues, Decreased initiation General Comments: pt unsure of day or month, requires increased time and requires repeated multimodal cuing        General Comments General comments (skin integrity, edema, etc.): pt with orthostatic BP in standing, requires 2L O2 via [place] to maintain SpO2 >90%O2        Assessment/Plan    PT Assessment Patient needs continued PT services  PT Problem List Decreased  strength;Decreased activity tolerance;Decreased balance;Decreased mobility;Decreased cognition;Decreased safety awareness;Cardiopulmonary status limiting activity;Pain       PT Treatment Interventions DME instruction;Gait training;Stair training;Functional mobility training;Therapeutic activities;Therapeutic exercise;Balance training;Cognitive remediation;Patient/family education    PT Goals (Current goals can be found in the Care Plan section)  Acute Rehab PT Goals Patient Stated Goal: go home PT Goal Formulation: With patient Time For Goal Achievement: 07/07/22 Potential to Achieve Goals: Fair    Frequency Min 1X/week        AM-PAC PT "6 Clicks" Mobility  Outcome Measure Help needed turning from your back to your side while in a flat bed without using bedrails?: A Lot Help needed moving from lying on your back to sitting on the side of a flat bed without using bedrails?: A Lot Help needed moving to and from a bed to a chair (including a wheelchair)?: A Lot Help needed standing up from a chair using your arms (e.g., wheelchair or bedside chair)?: A Lot Help needed to walk in hospital room?: Total Help needed climbing 3-5 steps with a railing? : Total 6 Click Score: 10  End of Session Equipment Utilized During Treatment: Gait belt;Oxygen Activity Tolerance: Treatment limited secondary to medical complications (Comment);Patient limited by pain (orthostatic hypotension) Patient left: in chair;with call bell/phone within reach;with chair alarm set Nurse Communication: Mobility status;Other (comment) (orthostatic hypotension, SLP consult due to decreased ability at mastication due to jaw pain from facial fx) PT Visit Diagnosis: Unsteadiness on feet (R26.81);Other abnormalities of gait and mobility (R26.89);Muscle weakness (generalized) (M62.81);History of falling (Z91.81);Difficulty in walking, not elsewhere classified (R26.2);Dizziness and giddiness (R42);Pain Pain - Right/Left:  Right Pain - part of body: Hand    Time: 0981-1914 PT Time Calculation (min) (ACUTE ONLY): 33 min   Charges:   PT Evaluation $PT Eval Moderate Complexity: 1 Mod PT Treatments $Therapeutic Activity: 8-22 mins        Taquana Bartley B. Beverely Risen PT, DPT Acute Rehabilitation Services Please use secure chat or  Call Office [phone]   Elon Alas [facility] 06/23/2022, 10:37 AM

## 2022-06-23 NOTE — Evaluation (Signed)
Clinical/Bedside Swallow Evaluation Patient Details  Name: [name] MRN: [id number] Date of Birth: 21-Oct-1927  Today's Date: 06/23/2022 Time: SLP Start Time (ACUTE ONLY): 1050 SLP Stop Time (ACUTE ONLY): 1106 SLP Time Calculation (min) (ACUTE ONLY): 16 min  Past Medical History:  Past Medical History:  Diagnosis Date   Abnormal Pap smear 1975-76   Acute deep vein thrombosis (DVT) of left peroneal vein (HCC)    Anemia    history of   Arthritis    spine   Breast cyst, left 1980   Cystocele 2012   Diverticulosis    with h/o Diverticulitis   DVT, lower extremity, distal, chronic (HCC) 12/24/2018   GERD (gastroesophageal reflux disease)    H/O hypercholesterolemia    H/O osteopenia    H/O varicella    H/O varicose veins    Hx of Mumps    Macular degeneration    Seasonal allergies    TIA (transient ischemic attack)    Urge incontinence 2012   Urinary frequency 2010   Past Surgical History:  Past Surgical History:  Procedure Laterality Date   14-Day Zio Patch  12/2021   Predominantly sinus rhythm open and 47-1:15 PM, average 70 bpm (1  AVB); rare isolated PACs PVCs.  55 atrial runs 4-28 beats fastest heart rate ranges 169 to 176 bpm.  Episodes lasting 1.8 to 15 seconds.  No sustained arrhythmias.  Symptoms not noted with atrial runs.   ABDOMINAL HYSTERECTOMY     BLADDER SUSPENSION N/A 06/16/2016   Procedure: TRANSVAGINAL TAPE (TVT) PROCEDURE;  Surgeon: Osborn Coho, MD;  Location: [facility];  Service: Gynecology;  Laterality: N/A;   BREAST CYST ASPIRATION  1964   COLONOSCOPY     Dr. Kinnie Scales   CT CTA CORONARY W/CA SCORE W/CM &/OR WO/CM  11/2021   1. Mild mixed non-obstructive CAD, CADRADS = 2.   2. Coronary calcium score of 255. This was 73rd percentile for age and sex matched control (based on an 89 yo).  3. Normal coronary origin with right dominance.   4. Aortic and mitral annular calcification.  5. Aortic atherosclerosis   CYSTOCELE REPAIR N/A 06/16/2016   Procedure:  ANTERIOR REPAIR (CYSTOCELE);  Surgeon: Osborn Coho, MD;  Location: [facility];  Service: Gynecology;  Laterality: N/A;   CYSTOSCOPY N/A 06/16/2016   Procedure: CYSTOSCOPY;  Surgeon: Osborn Coho, MD;  Location: [facility];  Service: Gynecology;  Laterality: N/A;  see anterior repair   Lower Extremity Venous Dopplers  01/09/2012   Right and left lower steroids: No evidence of thrombus or, thrombophlebitis; right and left GSV and SSV: No venous insufficiency. Normal exam.   NM MYOVIEW LTD  11/2016   6.6 METS.  Reached 106% of max.  Heart rate.  Walk for 4:40 min.  EF 72%.  Normal blood pressure response.  upsloping ST segment depression, nonspecific.  Otherwise normal study.  LOW RISK.  No evidence of ischemia or infarction.   TRANSTHORACIC ECHOCARDIOGRAM  11/2021   Normal LV Size & Fxn - EF 60-65%. No RWMA. ~ Diastolic Fxn. Degenerative MV - Mod MAC. ~ Mod AS   HPI:  [name] is a 89 y.o. female with medical history significant for CKD stage IIIb, moderate aortic stenosis, HTN, HLD, TIA, esophageal dysmotility who presented to the ED on 06/22/22 for evaluation after a syncopal event at home resulting in multiple injuries.   Patient states she was preparing breakfast in her kitchen earlier today.  She felt "faint" and lightheaded.  She was walking  to her living room when she suddenly lost consciousness and fell.  She does not recall having any other symptoms when she fell.  This was an unwitnessed fall.  Her husband found her down on the ground.  She had swelling and pain to the left side of her face as well as pain to her right wrist.   CT maxillofacial indicated Multiple acute and depressed/displaced left orbital and facial  fractures as discussed, including comminuted zygomatic arch fractures extending to the temporomandibular joint surface, a  lateral orbital fracture with displaced fragment causing medial  displacement of the lateral rectus muscle, and orbital/maxillary  antral fractures extending to  the pterygoid canal with gas in the  canal.   ST consulted d/t pt c/o pain when chewing per nursing report.    Assessment / Plan / Recommendation  Clinical Impression  Pt seen for clinical swallowing evaluation with impaired mastication/prolonged oral manipulation noted with solids, but puree/thin via straw appeared WFL.  Pt denotes pain with mastication d/t maxillofacial injury.  Attempted compensatory strategies including smaller bites, presenting food to R side with decreased mandibular ROM observed d/t pain with mastication with pt grimacing during consumption of solids. Recommend continue Dysphagia 3(mechanical soft)/thin liquids with placement of solids to R for ease of consumption and limited foods that require mastication until pain subsides in mandibular region.  Thin liquids and softer foods/puree foods may be easier to consume and decrease discomfort with po intake.  ST will f/u briefly for dysphagia tx/diet tolerance in acute setting.  Thank you for this consult. SLP Visit Diagnosis: Dysphagia, unspecified (R13.10)    Aspiration Risk  Mild aspiration risk    Diet Recommendation   Dysphagia 3(mechanical soft)/thin liquids; pt preferred foods  Medication Administration: Whole meds with liquid (as tolerated)    Other  Recommendations Oral Care Recommendations: Oral care BID    Recommendations for follow up therapy are one component of a multi-disciplinary discharge planning process, led by the attending physician.  Recommendations may be updated based on patient status, additional functional criteria and insurance authorization.  Follow up Recommendations No SLP follow up      Assistance Recommended at Discharge  TBD  Functional Status Assessment Patient has had a recent decline in their functional status and demonstrates the ability to make significant improvements in function in a reasonable and predictable amount of time.  Frequency and Duration min 1 x/week  1 week        Prognosis Prognosis for improved oropharyngeal function: Good      Swallow Study   General Date of Onset: 06/22/22 HPI: [name] is a 89 y.o. female with medical history significant for CKD stage IIIb, moderate aortic stenosis, HTN, HLD, TIA, esophageal dysmotility who presented to the ED on 06/22/22 for evaluation after a syncopal event at home resulting in multiple injuries.   Patient states she was preparing breakfast in her kitchen earlier today.  She felt "faint" and lightheaded.  She was walking to her living room when she suddenly lost consciousness and fell.  She does not recall having any other symptoms when she fell.  This was an unwitnessed fall.  Her husband found her down on the ground.  She had swelling and pain to the left side of her face as well as pain to her right wrist.   CT maxillofacial indicated Multiple acute and depressed/displaced left orbital and facial  fractures as discussed, including comminuted zygomatic arch fractures extending to the temporomandibular joint surface, a  lateral  orbital fracture with displaced fragment causing medial  displacement of the lateral rectus muscle, and orbital/maxillary  antral fractures extending to the pterygoid canal with gas in the  canal.   ST consulted d/t pt c/o pain when chewing per nursing report. Type of Study: Bedside Swallow Evaluation Previous Swallow Assessment: n/a Diet Prior to this Study: Dysphagia 3 (mechanical soft);Thin liquids (Level 0) Temperature Spikes Noted: No Respiratory Status: Nasal cannula History of Recent Intubation: No Behavior/Cognition: Alert;Cooperative;Other (Comment) (repeating information intermittently) Oral Cavity Assessment: Within Functional Limits Oral Care Completed by SLP: Recent completion by staff Oral Cavity - Dentition: Adequate natural dentition Vision: Functional for self-feeding Self-Feeding Abilities: Needs assist Patient Positioning: Upright in bed Baseline Vocal Quality: Low  vocal intensity Volitional Cough: Strong Volitional Swallow: Able to elicit    Oral/Motor/Sensory Function Overall Oral Motor/Sensory Function: Other (comment) (limited mandibular ROM d/t maxillofacial injury; pain with mastication per pt report)   Ice Chips Ice chips: Not tested   Thin Liquid Thin Liquid: Within functional limits Presentation: Straw    Nectar Thick Nectar Thick Liquid: Not tested   Honey Thick Honey Thick Liquid: Not tested   Puree Puree: Within functional limits Presentation: Spoon   Solid     Solid: Impaired Presentation: Spoon Oral Phase Impairments: Impaired mastication;Reduced lingual movement/coordination Oral Phase Functional Implications: Impaired mastication;Prolonged oral transit;Other (comment) (pain with mastication)      Pat Avalene Sealy,M.S., CCC-SLP 06/23/2022,2:13 PM

## 2022-06-24 ENCOUNTER — Other Ambulatory Visit: Payer: Self-pay | Admitting: Cardiology

## 2022-06-24 ENCOUNTER — Ambulatory Visit (INDEPENDENT_AMBULATORY_CARE_PROVIDER_SITE_OTHER): Payer: Medicare PPO

## 2022-06-24 DIAGNOSIS — Z7982 Long term (current) use of aspirin: Secondary | ICD-10-CM | POA: Diagnosis not present

## 2022-06-24 DIAGNOSIS — I951 Orthostatic hypotension: Secondary | ICD-10-CM | POA: Diagnosis present

## 2022-06-24 DIAGNOSIS — H3530 Unspecified macular degeneration: Secondary | ICD-10-CM | POA: Diagnosis present

## 2022-06-24 DIAGNOSIS — E860 Dehydration: Secondary | ICD-10-CM | POA: Diagnosis present

## 2022-06-24 DIAGNOSIS — R002 Palpitations: Secondary | ICD-10-CM

## 2022-06-24 DIAGNOSIS — S52502A Unspecified fracture of the lower end of left radius, initial encounter for closed fracture: Secondary | ICD-10-CM | POA: Diagnosis present

## 2022-06-24 DIAGNOSIS — J302 Other seasonal allergic rhinitis: Secondary | ICD-10-CM | POA: Diagnosis present

## 2022-06-24 DIAGNOSIS — R55 Syncope and collapse: Secondary | ICD-10-CM | POA: Diagnosis not present

## 2022-06-24 DIAGNOSIS — Z8249 Family history of ischemic heart disease and other diseases of the circulatory system: Secondary | ICD-10-CM | POA: Diagnosis not present

## 2022-06-24 DIAGNOSIS — I129 Hypertensive chronic kidney disease with stage 1 through stage 4 chronic kidney disease, or unspecified chronic kidney disease: Secondary | ICD-10-CM | POA: Diagnosis present

## 2022-06-24 DIAGNOSIS — S52511A Displaced fracture of right radial styloid process, initial encounter for closed fracture: Secondary | ICD-10-CM | POA: Diagnosis present

## 2022-06-24 DIAGNOSIS — S0285XA Fracture of orbit, unspecified, initial encounter for closed fracture: Secondary | ICD-10-CM | POA: Diagnosis present

## 2022-06-24 DIAGNOSIS — R6884 Jaw pain: Secondary | ICD-10-CM | POA: Diagnosis present

## 2022-06-24 DIAGNOSIS — Z91041 Radiographic dye allergy status: Secondary | ICD-10-CM | POA: Diagnosis not present

## 2022-06-24 DIAGNOSIS — S0282XA Fracture of other specified skull and facial bones, left side, initial encounter for closed fracture: Secondary | ICD-10-CM | POA: Diagnosis present

## 2022-06-24 DIAGNOSIS — N179 Acute kidney failure, unspecified: Secondary | ICD-10-CM | POA: Diagnosis present

## 2022-06-24 DIAGNOSIS — S0240FA Zygomatic fracture, left side, initial encounter for closed fracture: Secondary | ICD-10-CM | POA: Diagnosis present

## 2022-06-24 DIAGNOSIS — M8580 Other specified disorders of bone density and structure, unspecified site: Secondary | ICD-10-CM | POA: Diagnosis present

## 2022-06-24 DIAGNOSIS — I82452 Acute embolism and thrombosis of left peroneal vein: Secondary | ICD-10-CM | POA: Diagnosis present

## 2022-06-24 DIAGNOSIS — E7800 Pure hypercholesterolemia, unspecified: Secondary | ICD-10-CM | POA: Diagnosis present

## 2022-06-24 DIAGNOSIS — I2510 Atherosclerotic heart disease of native coronary artery without angina pectoris: Secondary | ICD-10-CM | POA: Diagnosis present

## 2022-06-24 DIAGNOSIS — Z1152 Encounter for screening for COVID-19: Secondary | ICD-10-CM | POA: Diagnosis not present

## 2022-06-24 DIAGNOSIS — R42 Dizziness and giddiness: Secondary | ICD-10-CM

## 2022-06-24 DIAGNOSIS — K219 Gastro-esophageal reflux disease without esophagitis: Secondary | ICD-10-CM | POA: Diagnosis present

## 2022-06-24 DIAGNOSIS — I499 Cardiac arrhythmia, unspecified: Secondary | ICD-10-CM

## 2022-06-24 DIAGNOSIS — W1830XA Fall on same level, unspecified, initial encounter: Secondary | ICD-10-CM | POA: Diagnosis present

## 2022-06-24 DIAGNOSIS — R339 Retention of urine, unspecified: Secondary | ICD-10-CM | POA: Diagnosis present

## 2022-06-24 DIAGNOSIS — I080 Rheumatic disorders of both mitral and aortic valves: Secondary | ICD-10-CM | POA: Diagnosis present

## 2022-06-24 DIAGNOSIS — N1832 Chronic kidney disease, stage 3b: Secondary | ICD-10-CM | POA: Diagnosis present

## 2022-06-24 LAB — GLUCOSE, CAPILLARY: Glucose-Capillary: 108 mg/dL — ABNORMAL HIGH (ref 70–99)

## 2022-06-24 MED ORDER — SENNOSIDES-DOCUSATE SODIUM 8.6-50 MG PO TABS
1.0000 | ORAL_TABLET | Freq: Two times a day (BID) | ORAL | Status: DC
  Administered 2022-06-24 – 2022-06-25 (×2): 1 via ORAL
  Filled 2022-06-24 (×2): qty 1

## 2022-06-24 MED ORDER — LACTATED RINGERS IV SOLN: INTRAVENOUS | Status: DC

## 2022-06-24 MED ORDER — LACTATED RINGERS IV SOLN: INTRAVENOUS | Status: AC

## 2022-06-24 MED ORDER — ENSURE ENLIVE PO LIQD
237.0000 mL | Freq: Two times a day (BID) | ORAL | Status: DC
  Administered 2022-06-24 – 2022-06-25 (×2): 237 mL via ORAL

## 2022-06-24 MED ORDER — HYDROCODONE-ACETAMINOPHEN 5-325 MG PO TABS
1.0000 | ORAL_TABLET | ORAL | Status: DC | PRN
  Administered 2022-06-24 – 2022-06-25 (×2): 1 via ORAL
  Filled 2022-06-24 (×2): qty 1

## 2022-06-24 MED ORDER — POLYETHYLENE GLYCOL 3350 17 G PO PACK
17.0000 g | PACK | Freq: Two times a day (BID) | ORAL | Status: DC
  Administered 2022-06-24: 17 g via ORAL
  Filled 2022-06-24: qty 1

## 2022-06-24 NOTE — Progress Notes (Addendum)
Physical Therapy Treatment Patient Details Name: [name] MRN: [id number] DOB: 1927/06/24 Today's Date: 06/24/2022   History of Present Illness 89 y.o. female who presented to the ED 6/5 after a syncopal event at home resulting in multiple injuries. Pt with R wrist fx, L facial fx, and orthostatic hypotension. PMH: CKD stage IIIb, moderate aortic stenosis, HTN, HLD, TIA, esophageal dysmotility.    PT Comments    Pt received in supine, initially alert and agreeable to therapy session, with good effort and tolerance for transfer and gait training with R platform RW and TED hose donned. BP stable throughout with knee-high compression, RN/MD notified. Pt needing up to modA for gait with platform RW and +2 for safety due to pt lethargy which progressed as pt fatigued and after pt given PO pain meds. Pt reports more than moderate pain in R wrist. Extensive discussion with pt/daughter on disposition plan, use of DME, likely progression of RUE healing/therapies and pt/daughter still agreeable to home with HHPT. Pt continues to benefit from PT services to progress toward functional mobility goals, DME updated below per discussion with supervising PT Beth VF.  Vital Signs  Patient Position (if appropriate) Orthostatic Vitals  Orthostatic Lying   BP- Lying 134/66  Pulse- Lying  (unable to write down)  Orthostatic Sitting  BP- Sitting (!) 131/110  Orthostatic Standing at 0 minutes  BP- Standing at 0 minutes 131/89  Orthostatic Standing at 3 minutes  BP- Standing at 3 minutes 147/70  Pulse- Standing at 3 minutes 75    Recommendations for follow up therapy are one component of a multi-disciplinary discharge planning process, led by the attending physician.  Recommendations may be updated based on patient status, additional functional criteria and insurance authorization.  Follow Up Recommendations       Assistance Recommended at Discharge Frequent or constant Supervision/Assistance  Patient  can return home with the following A lot of help with walking and/or transfers;A little help with bathing/dressing/bathroom;Assistance with cooking/housework;Assistance with feeding;Direct supervision/assist for medications management;Direct supervision/assist for financial management;Assist for transportation;Help with stairs or ramp for entrance   Equipment Recommendations  BSC/3in1;Other (comment) (R platform RW)    Recommendations for Other Services       Precautions / Restrictions Precautions Precautions: Fall Precaution Comments: watch BP -better with TED hose Required Braces or Orthoses: Other Brace Other Brace: thumb spica for R UE Restrictions Weight Bearing Restrictions: Yes RUE Weight Bearing: Weight bear through elbow only     Mobility  Bed Mobility Overal bed mobility: Needs Assistance Bed Mobility: Supine to Sit     Supine to sit: Min guard     General bed mobility comments: min cues for RUE elbow WB restriction, min guard for safety good initiation.    Transfers Overall transfer level: Needs assistance Equipment used: Right platform walker Transfers: Sit to/from Stand, Bed to chair/wheelchair/BSC Sit to Stand: Min assist, From elevated surface   Step pivot transfers: Min assist, +2 safety/equipment, From elevated surface       General transfer comment: from EOB>R platform RW and RW<>recliner, cues needed for safe LUE placement each transfer as pt with decreased recall of cues.    Ambulation/Gait Ambulation/Gait assistance: Min assist, Mod assist, +2 safety/equipment Gait Distance (Feet): 225 Feet Assistive device: Right platform walker Gait Pattern/deviations: Step-through pattern, Decreased stride length, Drifts right/left Gait velocity: variable, grossly 0.2-0.4 m/s     General Gait Details: Pt BP stable supine/sitting/standing and pt agreeeable to proceed, pt needs dense verbal/tactile cues for posture, proximity  to RW and directional navigation,  pt veering toward her L side throughout needing variable min/modA for stability and needs continuous assist to steer PFRW in correct direction. Close chair follow for safety given lethargy however pt able to return to room without requiring seated break   Stairs             Wheelchair Mobility    Modified Rankin (Stroke Patients Only)       Balance Overall balance assessment: Needs assistance Sitting-balance support: Single extremity supported, Feet supported Sitting balance-Leahy Scale: Fair     Standing balance support: Bilateral upper extremity supported (R PFRW) Standing balance-Leahy Scale: Fair (Poor to Fair, see comments) Standing balance comment: fair static standing wtih R PFRW, poor dynamic balance needs external assist for safety/stability                            Cognition Arousal/Alertness: Lethargic, Suspect due to medications Behavior During Therapy: Impulsive Overall Cognitive Status: Impaired/Different from baseline Area of Impairment: Following commands, Safety/judgement, Awareness, Problem solving, Attention, Memory                   Current Attention Level: Sustained Memory: Decreased recall of precautions, Decreased short-term memory Following Commands: Follows one step commands with increased time, Follows multi-step commands with increased time Safety/Judgement: Decreased awareness of safety, Decreased awareness of deficits Awareness: Intellectual Problem Solving: Slow processing, Difficulty sequencing, Requires verbal cues General Comments: Pt impulsive to stand prior to cues being given, poor recall of safe UE placement with transfers; daughter present and agreeable to plan for HHPT, extensive discussion on pt needs and likely progression of therapy next few weeks/need to maintain only R elbow WB until ortho/trauma MD clears her for progression of WB, daughter receptive. Pt initially alert but with sitting/standing tasks, pt  progressively more lethargic and often needing cues to open her eyes. Pt mostly perseverating on R wrist pain. RN gave pain meds during session, pt was already drowsy prior to meds being given.        Exercises      General Comments General comments (skin integrity, edema, etc.): SpO2 WFL on RA throughout, HR WFL. see BP above, stable with TED hose donned throughout      Pertinent Vitals/Pain Pain Assessment Pain Assessment: Faces Faces Pain Scale: Hurts even more (6-8) Pain Location: R wrist, generalized Pain Descriptors / Indicators: Grimacing, Guarding, Discomfort, Moaning, Sore Pain Intervention(s): Monitored during session, Repositioned, RN gave pain meds during session, Ice applied     PT Goals (current goals can now be found in the care plan section) Acute Rehab PT Goals Patient Stated Goal: go home PT Goal Formulation: With patient Time For Goal Achievement: 07/07/22 Progress towards PT goals: Progressing toward goals    Frequency    Min 1X/week      PT Plan Current plan remains appropriate    Co-evaluation PT/OT/SLP Co-Evaluation/Treatment: Yes Reason for Co-Treatment: For patient/therapist safety;To address functional/ADL transfers;Other (comment) PT goals addressed during session: Mobility/safety with mobility;Balance;Proper use of DME        AM-PAC PT "6 Clicks" Mobility   Outcome Measure  Help needed turning from your back to your side while in a flat bed without using bedrails?: A Little Help needed moving from lying on your back to sitting on the side of a flat bed without using bedrails?: A Little Help needed moving to and from a bed to a chair (including a wheelchair)?: A  Little Help needed standing up from a chair using your arms (e.g., wheelchair or bedside chair)?: A Little Help needed to walk in hospital room?: A Lot Help needed climbing 3-5 steps with a railing? : Total 6 Click Score: 15    End of Session Equipment Utilized During  Treatment: Gait belt Activity Tolerance: Patient tolerated treatment well;Other (comment) (despite lethargy good participation and tolerance) Patient left: in chair;with call bell/phone within reach;with family/visitor present;Other (comment) (daughter in room with her, pt requesting to keep her feet on the floor in chair) Nurse Communication: Mobility status;Patient requests pain meds;Other (comment) (stable BP with TED hose donned) PT Visit Diagnosis: Unsteadiness on feet (R26.81);Other abnormalities of gait and mobility (R26.89);Muscle weakness (generalized) (M62.81);History of falling (Z91.81);Difficulty in walking, not elsewhere classified (R26.2);Dizziness and giddiness (R42);Pain Pain - Right/Left: Right Pain - part of body:  (wrist)     Time: 1250-1330 PT Time Calculation (min) (ACUTE ONLY): 40 min  Charges:  $Gait Training: 8-22 mins                    Abdulaziz Toman P., PTA Acute Rehabilitation Services Secure Chat Preferred 9a-5:30pm Office: [phone]    Dorathy Kinsman [facility] 06/24/2022, 2:09 PM

## 2022-06-24 NOTE — Progress Notes (Unsigned)
Enrolled patient for a 14 day Zio AT monitor to be mailed to patients home   [name] to read

## 2022-06-24 NOTE — Plan of Care (Signed)

## 2022-06-24 NOTE — TOC Transition Note (Signed)
Transition of Care (TOC) - CM/SW Discharge Note Donn Pierini RN, BSN Transitions of Care Unit 4E- RN Case Manager See Treatment Team for direct phone #   Patient Details  Name: LARETTA PYATT MRN: [id number] Date of Birth: [birth year]  Transition of Care [facility]) CM/SW Contact:  Darrold Span, RN Phone Number: 06/24/2022, 1:43 PM   Clinical Narrative:    Follow up done for transition needs. Orders for HHPTOT have been placed and PT updated DME recommendations for platform RW as well as BSC.   Per conversation with pt yesterday-  HH referral made to [facility] as per pt choice- Referral has been accepted for needed services- PT/OT  DME-  Referral called to [employer]- for Right platform RW and BSC- DME to be delivered to room prior to discharge.   Per pt family to transport home.   No further TOC needs noted.    Final next level of care: Home w Home Health Services Barriers to Discharge: Barriers Resolved   Patient Goals and CMS Choice CMS Medicare.gov Compare Post Acute Care list provided to:: Patient Choice offered to / list presented to : Patient  Discharge Placement              Home w/ [facility]          Discharge Plan and Services Additional resources added to the After Visit Summary for     Discharge Planning Services: CM Consult Post Acute Care Choice: Durable Medical Equipment, Home Health          DME Arranged: Walker rolling DME Agency: [employer] Date DME Agency Contacted: 06/24/22 Time DME Agency Contacted: [phone] Representative spoke with at DME Agency: Mitch HH Arranged: PT, OT HH Agency: [facility] Date [facility] Agency Contacted: 06/24/22 Time HH Agency Contacted: [phone] Representative spoke with at [facility] Agency: Kandee Keen  Social Determinants of Health (SDOH) Interventions SDOH Screenings   Tobacco Use: Low Risk  (05/09/2022)     Readmission Risk Interventions     No data to display

## 2022-06-24 NOTE — Progress Notes (Signed)
   Durable Medical Equipment (From admission, onward)        Start     Ordered  06/24/22 1338  For home use only DME Walker rolling  Once      Comments: Right platform Question Answer Comment Walker: With 5 Inch Wheels  Patient needs a walker to treat with the following condition Syncope  Patient needs a walker to treat with the following condition Fx wrist    06/24/22 1338  06/24/22 0946  For home use only DME Bedside commode  Once      Question:  Patient needs a bedside commode to treat with the following condition  Answer:  Syncope  06/24/22 0947

## 2022-06-24 NOTE — Progress Notes (Signed)
PROGRESS NOTE    [name]  GNF:[id number] DOB: [birth year] DOA: 06/22/2022 PCP: Thana Ates, MD   Brief Narrative: 89 year old with past medical history significant for CKD stage IIIb, [name], [name], [name], Catherine dysmotility who presents to the ED for evaluation after syncopal event resulting in multiple injuries.  Patient reports  feeling faint and lightheaded the morning of admission, she was walking in her living room when she suddenly lost consciousness and fell.  Evaluation in the ED: Imaging right wrist show nondisplaced distal radial fracture involving the base of the radial styloid.  Fracture line extending to the radiocarpal joint.  CT head negative for acute intracranial abnormality.  Left facial fracture with air-fluid level in the left maxillary antrum.  CT maxillofacial: Multiple acute and depressed/displaced left orbital and facial fractures,  comminuted zygomatic arch fractures extending to the temporomandibular joint surface, a lateral orbital fracture with displaced fragment causing medial displacement of the lateral rectus muscle, and orbital/maxillary antral fractures extending to the pterygoid canal with gas in the canal. Soft tissue contusion over the left side of the face and zygomatic region.    Assessment & Plan:   Principal Problem:   Syncope Active Problems:   Thornton hypertension   Fracture of other specified skull and facial bones, left side, initial encounter for closed fracture (HCC)   Distal radial fracture   Chronic kidney disease, stage 3b (HCC)   Elevated troponin   Acute urinary retention   1-Syncope:  Orthostatic hypotension. Presume dehydration.  Continue to monitor on Telemetry.  Troponin not significantly elevated.  No significant electrolytes abnormalities.  ECHO Moderate Catherine stenosis.  Cardiology will arrange Holter.  Orthostatic Vitals positive. Plan for hydration for few hours, TED hose.      2-Left comminuted zygomatic arch fracture Left lateral orbital fracture Orbital/maxillary antral fractures: Appreciate Dr Jearld Fenton evaluation.  Patient will need to follow up in the office in 1-2 weeks.  Monitor for any diplopia or eye complaint. If develops any-- will need to contact Dr Jearld Fenton.  For Zygoma fracture he doesn't see any depression, but maybe present itself after 1 or 2 weeks swelling decreased.  Denies vision changes.   3-Distal right radial fracture:  Ortho planning on transition to off the shelf thumb spica splint treat like splint.  WBAT through the elbow.   CKD stage IIIb Stable  Monitor.   Urinary retention:  Foley catheter placed in the ED.  Plan to proceed with voiding trial.   Hypertension: Continue with Toprolol.  Elevation troponin, mild likely demand.    Estimated body mass index is 23.16 kg/m as calculated from the following:   Height as of this encounter: 5\' 3"  (1.6 m).   Weight as of this encounter: 59.3 kg.   DVT prophylaxis: SCD Code Status: Full cdoe Family Communication: Daughter at bedside 6/06 Disposition Plan:  Status is: Observation The patient remains OBS appropriate and will d/c before 2 midnights.    Consultants:  Ortho ENT   Procedures:    Antimicrobials:    Subjective: She report pain in her face left side.  She denies dyspnea.   Objective: Vitals:   06/24/22 0732 06/24/22 1101 06/24/22 1104 06/24/22 1109  BP: 111/82 (!) 107/58 113/68 98/62  Pulse: 78 77 81 80  Resp: 20 16 16 19   Temp: 97.7 F (36.5 C) 97.6 F (36.4 C)    TempSrc: Axillary Oral    SpO2: 95% 96% 95% 96%  Weight:      Height:  Intake/Output Summary (Last 24 hours) at 06/24/2022 1439 Last data filed at 06/24/2022 1049 Gross per 24 hour  Intake --  Output 1150 ml  Net -1150 ml    Filed Weights   06/22/22 1542 06/24/22 0322  Weight: 56.7 kg 59.3 kg    Examination:  General exam: NAD, face left side with edema, hematoma.   Respiratory system: CTA Cardiovascular system: S 1, S 2 RRR Gastrointestinal system: BS present, soft, nt Central nervous system: Alert Extremities: no edema,     Data Reviewed: I have personally reviewed following labs and imaging studies  CBC: Recent Labs  Lab 06/22/22 1544 06/22/22 2343  WBC 10.1 8.1  HGB 13.4 12.1  HCT 36.9 33.4*  MCV 82.7 83.7  PLT 184 171    Basic Metabolic Panel: Recent Labs  Lab 06/22/22 1544 06/22/22 2343  NA 133* 133*  K 4.1 4.4  CL 98 101  CO2 22 23  GLUCOSE 140* 142*  BUN 23 22  CREATININE 1.27* 1.15*  CALCIUM 9.4 8.5*    GFR: Estimated Creatinine Clearance: 25.8 mL/min (A) (by C-G formula based on SCr of 1.15 mg/dL (H)). Liver Function Tests: No results for input(s): "AST", "ALT", "ALKPHOS", "BILITOT", "PROT", "ALBUMIN" in the last 168 hours. No results for input(s): "LIPASE", "AMYLASE" in the last 168 hours. No results for input(s): "AMMONIA" in the last 168 hours. Coagulation Profile: No results for input(s): "INR", "PROTIME" in the last 168 hours. Cardiac Enzymes: No results for input(s): "CKTOTAL", "CKMB", "CKMBINDEX", "TROPONINI" in the last 168 hours. BNP (last 3 results) No results for input(s): "PROBNP" in the last 8760 hours. HbA1C: No results for input(s): "HGBA1C" in the last 72 hours. CBG: Recent Labs  Lab 06/22/22 1547 06/23/22 0704 06/24/22 0635  GLUCAP 135* 106* 108*    Lipid Profile: No results for input(s): "CHOL", "HDL", "LDLCALC", "TRIG", "CHOLHDL", "LDLDIRECT" in the last 72 hours. Thyroid Function Tests: No results for input(s): "TSH", "T4TOTAL", "FREET4", "T3FREE", "THYROIDAB" in the last 72 hours. Anemia Panel: No results for input(s): "VITAMINB12", "FOLATE", "FERRITIN", "TIBC", "IRON", "RETICCTPCT" in the last 72 hours. Sepsis Labs: No results for input(s): "PROCALCITON", "LATICACIDVEN" in the last 168 hours.  Recent Results (from the past 240 hour(s))  SARS Coronavirus 2 by RT PCR (hospital  order, performed in [facility] hospital lab) *cepheid single result test* Anterior Nasal Swab     Status: None   Collection Time: 06/23/22  5:55 PM   Specimen: Anterior Nasal Swab  Result Value Ref Range Status   SARS Coronavirus 2 by RT PCR NEGATIVE NEGATIVE Final    Comment: Performed at [facility], [address], [place], [place] [address]         Radiology Studies: ECHOCARDIOGRAM COMPLETE  Result Date: 06/23/2022    ECHOCARDIOGRAM REPORT   Patient Name:   DEAMBER WANZER Date of Exam: 06/23/2022 Medical Rec #:  [id number]     Height:       63.0 in Accession #:    [id number]    Weight:       125.0 lb Date of Birth:  Jun 06, 1927     BSA:          1.584 m Patient Age:    89 years      BP:           120/66 mmHg Patient Gender: F             HR:           89 bpm.  Exam Location:  Inpatient Procedure: 2D Echo, Color Doppler and Cardiac Doppler Indications:    R55 Syncope  History:        Patient has prior history of Echocardiogram examinations, most                 recent 12/15/2021. Risk Factors:Hypertension and Dyslipidemia.  Sonographer:    Irving Burton Senior RDCS Referring Phys: Charlsie Quest  Sonographer Comments: Suboptimal apical windows due to small rib spaces. IMPRESSIONS  1. Left ventricular ejection fraction, by estimation, is 65 to 70%. The left ventricle has normal function. The left ventricle has no regional wall motion abnormalities. There is mild concentric left ventricular hypertrophy. Left ventricular diastolic parameters are consistent with Grade I diastolic dysfunction (impaired relaxation).  2. Right ventricular systolic function is normal. The right ventricular size is normal. There is normal pulmonary artery systolic pressure.  3. A small pericardial effusion is present. The pericardial effusion is posterior to the left ventricle.  4. The mitral valve is abnormal. Mild mitral valve regurgitation. Mild mitral stenosis. The mean mitral valve gradient is 5.0 mmHg. Moderate mitral  annular calcification.  5. Fused left and non cusps. The Catherine valve is calcified. There is moderate calcification of the Catherine valve. Catherine valve regurgitation is mild. Moderate Catherine valve stenosis. Catherine valve area, by VTI measures 1.09 cm. Catherine valve mean gradient measures 21.0 mmHg. Catherine valve Vmax measures 3.22 m/s.  6. The inferior vena cava is normal in size with greater than 50% respiratory variability, suggesting right atrial pressure of 3 mmHg. FINDINGS  Left Ventricle: Left ventricular ejection fraction, by estimation, is 65 to 70%. The left ventricle has normal function. The left ventricle has no regional wall motion abnormalities. The left ventricular internal cavity size was normal in size. There is  mild concentric left ventricular hypertrophy. Left ventricular diastolic parameters are consistent with Grade I diastolic dysfunction (impaired relaxation). Right Ventricle: The right ventricular size is normal. No increase in right ventricular wall thickness. Right ventricular systolic function is normal. There is normal pulmonary artery systolic pressure. The tricuspid regurgitant velocity is 2.31 m/s, and  with an assumed right atrial pressure of 3 mmHg, the estimated right ventricular systolic pressure is 24.3 mmHg. Left Atrium: Left atrial size was normal in size. Right Atrium: Right atrial size was normal in size. Pericardium: A small pericardial effusion is present. The pericardial effusion is posterior to the left ventricle. Mitral Valve: The mitral valve is abnormal. There is moderate thickening of the mitral valve leaflet(s). There is moderate calcification of the mitral valve leaflet(s). Moderate mitral annular calcification. Mild mitral valve regurgitation. Mild mitral valve stenosis. MV peak gradient, 8.4 mmHg. The mean mitral valve gradient is 5.0 mmHg. Tricuspid Valve: The tricuspid valve is normal in structure. Tricuspid valve regurgitation is mild. Catherine Valve: Fused left and  non cusps. The Catherine valve is calcified. There is moderate calcification of the Catherine valve. Catherine valve regurgitation is mild. Moderate Catherine stenosis is present. Catherine valve mean gradient measures 21.0 mmHg. Catherine valve peak gradient measures 41.5 mmHg. Catherine valve area, by VTI measures 1.09 cm. Pulmonic Valve: The pulmonic valve was normal in structure. Pulmonic valve regurgitation is trivial. Aorta: The Catherine root and ascending aorta are structurally normal, with no evidence of dilitation. Venous: The inferior vena cava is normal in size with greater than 50% respiratory variability, suggesting right atrial pressure of 3 mmHg. IAS/Shunts: The interatrial septum was not well visualized.  LEFT VENTRICLE PLAX 2D LVIDd:  3.10 cm   Diastology LVIDs:         1.90 cm   LV e' medial:    5.33 cm/s LV PW:         1.00 cm   LV E/e' medial:  20.8 LV IVS:        1.10 cm   LV e' lateral:   4.90 cm/s LVOT diam:     2.10 cm   LV E/e' lateral: 22.7 LV SV:         68 LV SV Index:   43 LVOT Area:     3.46 cm  RIGHT VENTRICLE RV S prime:     14.70 cm/s TAPSE (M-mode): 2.1 cm LEFT ATRIUM             Index        RIGHT ATRIUM           Index LA diam:        2.70 cm 1.71 cm/m   RA Area:     15.60 cm LA Vol (A2C):   31.0 ml 19.58 ml/m  RA Volume:   41.40 ml  26.14 ml/m LA Vol (A4C):   42.7 ml 26.97 ml/m LA Biplane Vol: 36.4 ml 22.99 ml/m  Catherine VALVE AV Area (Vmax):    1.10 cm AV Area (Vmean):   1.27 cm AV Area (VTI):     1.09 cm AV Vmax:           322.00 cm/s AV Vmean:          204.000 cm/s AV VTI:            0.628 m AV Peak Grad:      41.5 mmHg AV Mean Grad:      21.0 mmHg LVOT Vmax:         102.00 cm/s LVOT Vmean:        75.000 cm/s LVOT VTI:          0.197 m LVOT/AV VTI ratio: 0.31  AORTA Ao Root diam: 3.00 cm Ao Asc diam:  3.00 cm MITRAL VALVE                TRICUSPID VALVE MV Area (PHT): 1.54 cm     TR Peak grad:   21.3 mmHg MV Peak grad:  8.4 mmHg     TR Vmax:        231.00 cm/s MV Mean grad:  5.0  mmHg MV Vmax:       1.45 m/s     SHUNTS MV Vmean:      103.0 cm/s   Systemic VTI:  0.20 m MV Decel Time: 494 msec     Systemic Diam: 2.10 cm MV E velocity: 111.00 cm/s MV A velocity: 144.00 cm/s MV E/A ratio:  0.77 Charlton Haws MD Electronically signed by Charlton Haws MD Signature Date/Time: 06/23/2022/11:49:04 AM    Final    CT CERVICAL SPINE WO CONTRAST  Result Date: 06/22/2022 CLINICAL DATA:  fall with facial fractures, rule out C spine injury EXAM: CT CERVICAL SPINE WITHOUT CONTRAST TECHNIQUE: Multidetector CT imaging of the cervical spine was performed without intravenous contrast. Multiplanar CT image reconstructions were also generated. RADIATION DOSE REDUCTION: This exam was performed according to the departmental dose-optimization program which includes automated exposure control, adjustment of the mA and/or kV according to patient size and/or use of iterative reconstruction technique. COMPARISON:  None Available. FINDINGS: Alignment: Normal Skull base and vertebrae: No acute fracture. No primary bone lesion or focal pathologic process.  Soft tissues and spinal canal: No prevertebral fluid or swelling. No visible canal hematoma. Disc levels: Mild bilateral degenerative facet disease and diffuse degenerative disc disease. Congenital fusion across the C4-5 disc space. No visible disc herniation. Upper chest: No acute findings Other: None IMPRESSION: No acute bony abnormality. Electronically Signed   By: Charlett Nose M.D.   On: 06/22/2022 23:23   DG Chest 1 View  Result Date: 06/22/2022 CLINICAL DATA:  Syncopal episode and fall today. EXAM: CHEST  1 VIEW COMPARISON:  10/18/2021 FINDINGS: Heart size and pulmonary vascularity are normal. Linear fibrosis or atelectasis in the lung bases, similar to prior study. No airspace disease or consolidation. No pleural effusions. No pneumothorax. Mediastinal contours appear intact. Calcification of the aorta. Degenerative changes in the spine and shoulders. IMPRESSION:  Linear atelectasis or fibrosis in the lung bases. No evidence of active pulmonary disease. Electronically Signed   By: Burman Nieves M.D.   On: 06/22/2022 17:58   DG Hip Unilat W or Wo Pelvis 2-3 Views Right  Result Date: 06/22/2022 CLINICAL DATA:  Right hip pain after syncopal episode and fall today. EXAM: DG HIP (WITH OR WITHOUT PELVIS) 2-3V RIGHT COMPARISON:  None Available. FINDINGS: Mild degenerative changes in the hips. Pelvis and right hip appear intact. No evidence of acute fracture or dislocation. No focal bone lesion or bone destruction. SI joints and symphysis pubis are not displaced. IMPRESSION: Degenerative changes in the right hip. No acute displaced fractures identified. Electronically Signed   By: Burman Nieves M.D.   On: 06/22/2022 17:57   CT Maxillofacial Wo Contrast  Result Date: 06/22/2022 CLINICAL DATA:  Blunt facial trauma with left zygomatic arch pain and ecchymosis. Left jaw pain. Syncope or presyncope. EXAM: CT MAXILLOFACIAL WITHOUT CONTRAST TECHNIQUE: Multidetector CT imaging of the maxillofacial structures was performed. Multiplanar CT image reconstructions were also generated. RADIATION DOSE REDUCTION: This exam was performed according to the departmental dose-optimization program which includes automated exposure control, adjustment of the mA and/or kV according to patient size and/or use of iterative reconstruction technique. COMPARISON:  CT head 06/01/2021 FINDINGS: Osseous: Acute comminuted and depressed fractures of the zygomatic arch with fracture lines extending to the temporomandibular joint surface. Acute comminuted and depressed fractures of the lateral left orbital wall with displacement of a tiny fragment into the extraconal intraorbital space, partially displacing the rectus lateralis muscle. Mildly depressed fracture of the posterior aspect of the inferior orbital wall with gas demonstrated in the pterygoid canal. Comminuted and depressed fractures of the lateral  left maxillary antral wall extending to the upper maxilla. The mandible appears intact. Degenerative changes in the temporomandibular joints. Orbits: The globes and extraocular muscles appear intact and symmetrical. Medial displacement of the left lateral rectus muscle as above. Sinuses: Air-fluid level in the left maxillary antrum. Soft tissues: Soft tissue infiltration consistent with contusion or hematoma in the lateral aspect of the left orbital and facial region centered mostly around the zygomatic arch fractures. Limited intracranial: See additional report of CT head same date. IMPRESSION: 1. Multiple acute and depressed/displaced left orbital and facial fractures as discussed, including comminuted zygomatic arch fractures extending to the temporomandibular joint surface, a lateral orbital fracture with displaced fragment causing medial displacement of the lateral rectus muscle, and orbital/maxillary antral fractures extending to the pterygoid canal with gas in the canal. 2. Associated posttraumatic air-fluid level in the left maxillary antrum. 1. Soft tissue contusion over the left side of the face and zygomatic region. Electronically Signed   By: Chrissie Noa  Andria Meuse M.D.   On: 06/22/2022 17:56   CT HEAD WO CONTRAST  Result Date: 06/22/2022 CLINICAL DATA:  Syncope or presyncope with cerebrovascular cause suspected. Facial trauma. Left jaw pain. EXAM: CT HEAD WITHOUT CONTRAST TECHNIQUE: Contiguous axial images were obtained from the base of the skull through the vertex without intravenous contrast. RADIATION DOSE REDUCTION: This exam was performed according to the departmental dose-optimization program which includes automated exposure control, adjustment of the mA and/or kV according to patient size and/or use of iterative reconstruction technique. COMPARISON:  MRI brain 10/19/2021.  CT head 06/01/2021 FINDINGS: Brain: Diffuse cerebral atrophy. Ventricular dilatation consistent with central atrophy.  Low-attenuation changes in the deep white matter consistent with small vessel ischemia. No abnormal extra-axial fluid collections. No mass effect or midline shift. Gray-white matter junctions are distinct. Basal cisterns are not effaced. No acute intracranial hemorrhage. Vascular: No hyperdense vessel or unexpected calcification. Skull: Normal. Negative for fracture or focal lesion. Sinuses/Orbits: Air-fluid level in the left maxillary antrum with multiple left facial fractures. See additional report of CT maxillofacial. Mastoid air cells are clear. Other: None. IMPRESSION: 1. No acute intracranial abnormalities. Chronic atrophy and small vessel ischemic changes. 2. Left facial fractures with air-fluid level in the left maxillary antrum. See additional report of CT maxillofacial same date. Electronically Signed   By: Burman Nieves M.D.   On: 06/22/2022 17:37   DG Wrist Complete Right  Result Date: 06/22/2022 CLINICAL DATA:  Pain after fall EXAM: RIGHT WRIST - COMPLETE 4 VIEW COMPARISON:  None Available. FINDINGS: Nondisplaced fracture of the extreme distal radius involving the radiocarpal joint and the radial styloid. No additional fracture or dislocation. Osteopenia. Chondrocalcinosis. Soft tissue swelling. IMPRESSION: Nondisplaced distal radial fracture involving the base of the radial styloid. Fracture line extends to the radiocarpal joint. Chondrocalcinosis and soft tissue swelling. Electronically Signed   By: Karen Kays M.D.   On: 06/22/2022 16:11        Scheduled Meds:  azithromycin  500 mg Oral Daily   Chlorhexidine Gluconate Cloth  6 each Topical Daily   guaiFENesin  1,200 mg Oral BID   metoprolol succinate  25 mg Oral QPM   sodium chloride flush  3 mL Intravenous Q12H   Continuous Infusions:  lactated ringers       LOS: 0 days    Time spent: 35 minutes    Merina Behrendt A Jakyrah Holladay, MD [facility]   If 7PM-7AM, please contact night-coverage [email or web address]  06/24/2022, 2:39  PM

## 2022-06-24 NOTE — Progress Notes (Signed)
Occupational Therapy Treatment Patient Details Name: [name] MRN: [id number] DOB: 03/02/1927 Today's Date: 06/24/2022   History of present illness 89 y.o. female who presented to the ED 6/5 after a syncopal event at home resulting in multiple injuries. Pt with R wrist fx, L facial fx, and orthostatic hypotension. PMH: CKD stage IIIb, moderate aortic stenosis, HTN, HLD, TIA, esophageal dysmotility.   OT comments  Pt with stable BP with use of compression hose. Able to ambulate in hall with mod assist and chair follow. Pt keeping eyes closed much of the time, but denied dizziness. Endorses pain all over, but especially in R wrist. RN provided pain meds during session. Pt's daughter participated in session and is aware pt will need min to total assist for ADLs, constant assist for mobility with platform walker and in multiple uses of 3 in 1. Daughter stating she can provide assist for pt as long as it is needed.    Recommendations for follow up therapy are one component of a multi-disciplinary discharge planning process, led by the attending physician.  Recommendations may be updated based on patient status, additional functional criteria and insurance authorization.    Assistance Recommended at Discharge Frequent or constant Supervision/Assistance  Patient can return home with the following  A lot of help with walking and/or transfers;A lot of help with bathing/dressing/bathroom;Assistance with cooking/housework;Assistance with feeding;Direct supervision/assist for medications management;Direct supervision/assist for financial management;Assist for transportation;Help with stairs or ramp for entrance   Equipment Recommendations  BSC/3in1    Recommendations for Other Services      Precautions / Restrictions Precautions Precautions: Fall Precaution Comments: watch BP -better with TED hose Required Braces or Orthoses: Other Brace Other Brace: thumb spica for R UE Restrictions Weight  Bearing Restrictions: Yes RUE Weight Bearing: Weight bear through elbow only       Mobility Bed Mobility Overal bed mobility: Needs Assistance Bed Mobility: Supine to Sit     Supine to sit: Min guard     General bed mobility comments: min cues for RUE elbow WB restriction, min guard for safety good initiation, HOB up    Transfers Overall transfer level: Needs assistance Equipment used: Right platform walker Transfers: Sit to/from Stand, Bed to chair/wheelchair/BSC Sit to Stand: Min assist, From elevated surface     Step pivot transfers: Min assist, +2 safety/equipment, From elevated surface     General transfer comment: from EOB>R platform RW and RW<>recliner, cues needed for safe LUE placement each transfer as pt with decreased recall of cues.     Balance Overall balance assessment: Needs assistance Sitting-balance support: Single extremity supported, Feet supported Sitting balance-Leahy Scale: Fair     Standing balance support: Bilateral upper extremity supported Standing balance-Leahy Scale: Poor                             ADL either performed or assessed with clinical judgement   ADL                                         General ADL Comments: Educated daughter in multiple uses of 3 in 1 and use of gowns/dresses to make managing toileting easier. Instructed daughter in how to use ice bags and benefits of icing and elevating R UE. Educated in NWB through R wrist and level of assist pt will need for ADLs.  Extremity/Trunk Assessment              Vision       Perception     Praxis      Cognition Arousal/Alertness: Lethargic, Suspect due to medications Behavior During Therapy: Impulsive Overall Cognitive Status: Impaired/Different from baseline Area of Impairment: Following commands, Safety/judgement, Awareness, Problem solving, Attention, Memory                 Orientation Level: Disoriented to, Time Current  Attention Level: Sustained Memory: Decreased recall of precautions, Decreased short-term memory Following Commands: Follows one step commands with increased time, Follows multi-step commands with increased time Safety/Judgement: Decreased awareness of safety, Decreased awareness of deficits Awareness: Intellectual Problem Solving: Slow processing, Difficulty sequencing, Requires verbal cues General Comments: pt closing her eyes frequently throughout session, but denied dizziness, BP monitored throughout and stable        Exercises      Shoulder Instructions       General Comments SpO2 WFL on RA throughout, HR WFL. see BP above, stable with TED hose donned throughout    Pertinent Vitals/ Pain       Pain Assessment Pain Assessment: Faces Faces Pain Scale: Hurts even more Pain Location: R wrist, generalized Pain Descriptors / Indicators: Grimacing, Guarding, Discomfort, Moaning, Sore Pain Intervention(s): Monitored during session, Repositioned, RN gave pain meds during session  Home Living                                          Prior Functioning/Environment              Frequency  Min 2X/week        Progress Toward Goals  OT Goals(current goals can now be found in the care plan section)  Progress towards OT goals: Progressing toward goals  Acute Rehab OT Goals OT Goal Formulation: With patient Time For Goal Achievement: 07/07/22 Potential to Achieve Goals: Good  Plan Discharge plan remains appropriate    Co-evaluation    PT/OT/SLP Co-Evaluation/Treatment: Yes Reason for Co-Treatment: For patient/therapist safety PT goals addressed during session: Mobility/safety with mobility;Balance;Proper use of DME OT goals addressed during session: ADL's and self-care      AM-PAC OT "6 Clicks" Daily Activity     Outcome Measure   Help from another person eating meals?: A Little Help from another person taking care of personal grooming?: A  Lot Help from another person toileting, which includes using toliet, bedpan, or urinal?: Total Help from another person bathing (including washing, rinsing, drying)?: A Lot Help from another person to put on and taking off regular upper body clothing?: A Lot Help from another person to put on and taking off regular lower body clothing?: Total 6 Click Score: 11    End of Session Equipment Utilized During Treatment: Gait belt;Rolling walker (2 wheels)  OT Visit Diagnosis: Unsteadiness on feet (R26.81);Pain;Muscle weakness (generalized) (M62.81);Other symptoms and signs involving cognitive function Pain - Right/Left: Right Pain - part of body: Arm   Activity Tolerance Patient tolerated treatment well   Patient Left in chair;with call bell/phone within reach;with chair alarm set;with family/visitor present   Nurse Communication Mobility status;Patient requests pain meds        Time: 6962-9528 OT Time Calculation (min): 46 min  Charges: OT General Charges $OT Visit: 1 Visit OT Treatments $Self Care/Home Management : 8-22 mins  Berna Spare, OTR/L Acute Rehabilitation  Services Office: [phone]   Evern Bio 06/24/2022, 3:04 PM

## 2022-06-24 NOTE — Plan of Care (Signed)
  Problem: Education: Goal: Knowledge of condition and prescribed therapy will improve 06/24/2022 1323 by Isla Pence, RN Outcome: Progressing 06/24/2022 1119 by Isla Pence, RN Outcome: Progressing   Problem: Cardiac: Goal: Will achieve and/or maintain adequate cardiac output 06/24/2022 1323 by Isla Pence, RN Outcome: Progressing 06/24/2022 1119 by Isla Pence, RN Outcome: Progressing   Problem: Physical Regulation: Goal: Complications related to the disease process, condition or treatment will be avoided or minimized 06/24/2022 1323 by Isla Pence, RN Outcome: Progressing 06/24/2022 1119 by Isla Pence, RN Outcome: Progressing   Problem: Education: Goal: Knowledge of General Education information will improve Description: Including pain rating scale, medication(s)/side effects and non-pharmacologic comfort measures 06/24/2022 1323 by Isla Pence, RN Outcome: Progressing 06/24/2022 1119 by Isla Pence, RN Outcome: Progressing   Problem: Health Behavior/Discharge Planning: Goal: Ability to manage health-related needs will improve 06/24/2022 1323 by Isla Pence, RN Outcome: Progressing 06/24/2022 1119 by Isla Pence, RN Outcome: Progressing   Problem: Clinical Measurements: Goal: Ability to maintain clinical measurements within normal limits will improve 06/24/2022 1323 by Isla Pence, RN Outcome: Progressing 06/24/2022 1119 by Isla Pence, RN Outcome: Progressing Goal: Will remain free from infection 06/24/2022 1323 by Isla Pence, RN Outcome: Progressing 06/24/2022 1119 by Isla Pence, RN Outcome: Progressing Goal: Diagnostic test results will improve 06/24/2022 1323 by Isla Pence, RN Outcome: Progressing 06/24/2022 1119 by Isla Pence, RN Outcome: Progressing Goal: Respiratory complications will improve 06/24/2022 1323 by Isla Pence, RN Outcome: Progressing 06/24/2022 1119 by Isla Pence, RN Outcome: Progressing Goal: Cardiovascular complication will be avoided 06/24/2022 1323 by Isla Pence, RN Outcome: Progressing 06/24/2022 1119 by Isla Pence, RN Outcome: Progressing   Problem: Activity: Goal: Risk for activity intolerance will decrease 06/24/2022 1323 by Isla Pence, RN Outcome: Progressing 06/24/2022 1119 by Isla Pence, RN Outcome: Progressing   Problem: Nutrition: Goal: Adequate nutrition will be maintained 06/24/2022 1323 by Isla Pence, RN Outcome: Progressing 06/24/2022 1119 by Isla Pence, RN Outcome: Progressing   Problem: Coping: Goal: Level of anxiety will decrease 06/24/2022 1323 by Isla Pence, RN Outcome: Progressing 06/24/2022 1119 by Isla Pence, RN Outcome: Progressing   Problem: Elimination: Goal: Will not experience complications related to bowel motility 06/24/2022 1323 by Isla Pence, RN Outcome: Progressing 06/24/2022 1119 by Isla Pence, RN Outcome: Progressing Goal: Will not experience complications related to urinary retention 06/24/2022 1323 by Isla Pence, RN Outcome: Progressing 06/24/2022 1119 by Isla Pence, RN Outcome: Progressing   Problem: Pain Managment: Goal: General experience of comfort will improve 06/24/2022 1323 by Isla Pence, RN Outcome: Progressing 06/24/2022 1119 by Isla Pence, RN Outcome: Progressing   Problem: Safety: Goal: Ability to remain free from injury will improve 06/24/2022 1323 by Isla Pence, RN Outcome: Progressing 06/24/2022 1119 by Isla Pence, RN Outcome: Progressing   Problem: Skin Integrity: Goal: Risk for impaired skin integrity will decrease 06/24/2022 1323 by Isla Pence, RN Outcome: Progressing 06/24/2022 1119 by Isla Pence, RN Outcome: Progressing

## 2022-06-25 DIAGNOSIS — R55 Syncope and collapse: Secondary | ICD-10-CM | POA: Diagnosis not present

## 2022-06-25 LAB — GLUCOSE, CAPILLARY: Glucose-Capillary: 113 mg/dL — ABNORMAL HIGH (ref 70–99)

## 2022-06-25 MED ORDER — HYDROCODONE-ACETAMINOPHEN 5-325 MG PO TABS: 1.0000 | ORAL_TABLET | Freq: Four times a day (QID) | ORAL | 0 refills | Status: DC | PRN

## 2022-06-25 MED ORDER — POLYETHYLENE GLYCOL 3350 17 G PO PACK: 17.0000 g | PACK | Freq: Every day | ORAL | 0 refills | Status: DC | PRN

## 2022-06-25 MED ORDER — SENNOSIDES-DOCUSATE SODIUM 8.6-50 MG PO TABS: 2.0000 | ORAL_TABLET | Freq: Every evening | ORAL | 0 refills | Status: DC | PRN

## 2022-06-25 NOTE — Discharge Summary (Signed)
Physician Discharge Summary  KAYDREE FEJES ZOX:[id number] DOB: 05-14-1927 DOA: 06/22/2022  PCP: Thana Ates, MD  Admit date: 06/22/2022 Discharge date: 06/25/2022  Admitted From: Home Disposition:  Home with [facility]  Recommendations for Outpatient Follow-up:  Follow up with PCP in 1-2 weeks Please obtain BMP/CBC in one week your next doctors visit.  Follow up outpatient Orthopedic on upcoming Wednesday Outpatient Follow up with ENT, information provided by their service  Pain meds with bowel regimen Zio patch arrangement to be made by cardiology.   Home Health: Equipment/Devices: Discharge Condition: Stable CODE STATUS:  Diet recommendation:   Brief/Interim Summary:  89 year old with past medical history significant for CKD stage IIIb, aortic stenosis pretension, hyperlipidemia, TIA, esophageal dysmotility who presents to the ED for evaluation after syncopal event resulting in multiple injuries.  Patient reports  feeling faint and lightheaded the morning of admission, she was walking in her living room when she suddenly lost consciousness and fell.   Evaluation in the ED: Imaging right wrist show nondisplaced distal radial fracture involving the base of the radial styloid.  Fracture line extending to the radiocarpal joint.  CT head negative for acute intracranial abnormality.  Left facial fracture with air-fluid level in the left maxillary antrum.  CT maxillofacial: Multiple acute and depressed/displaced left orbital and facial fractures,  comminuted zygomatic arch fractures extending to the temporomandibular joint surface, a lateral orbital fracture with displaced fragment causing medial displacement of the lateral rectus muscle, and orbital/maxillary antral fractures extending to the pterygoid canal with gas in the canal. Soft tissue contusion over the left side of the face and zygomatic region.  During the hospitalization patient was seen by cardiology, ENT, orthopedic and trauma service.   Echocardiogram showed moderate aortic stenosis, outpatient Zio patch to be arranged.  Orthopedic was able to address her right upper extremity fracture placing splint with outpatient follow-up.  ENT is also arrange for outpatient follow-up Patient's son updated by me today on 06/25/2022.  All the questions answered.  Currently awaiting home equipments to be delivered.  Otherwise home health services have been ordered.  Syncope:  Likely orthostatic.  Echocardiogram shows moderate aortic stenosis.  Patient received hydration, symptoms are better.  Cardiology to arrange for outpatient Zio patch     Left comminuted zygomatic arch fracture Left lateral orbital fracture Orbital/maxillary antral fractures: Patient seen by trauma service, ENT and orthopedic.  Currently has a right upper extremity splint in place.  Orthopedic will follow-up outpatient on Wednesday.  Weightbearing as tolerated through her elbow in the meantime. ENT is also provided information for outpatient follow-up. Pain medication with bowel regimen  Distal right radial fracture:  Ortho planning on transition to off the shelf thumb spica splint treat like splint.  WBAT through the elbow.    CKD stage IIIb Stable  Monitor.    Urinary retention: No longer requiring Foley catheter   Hypertension: Continue with Toprolol.  Elevation troponin, mild likely demand.      Estimated body mass index is 23.16 kg/m as calculated from the following:   Height as of this encounter: 5\' 3"  (1.6 m).   Weight as of this encounter: 59.3 kg.   PT/OT recommended home health.  Currently awaiting equipment to be delivered.  TOC to work with family.  I also spoken with son today and updated him.  Discharge Diagnoses:  Principal Problem:   Syncope Active Problems:   Essential hypertension   Fracture of other specified skull and facial bones, left side, initial encounter for  closed fracture (HCC)   Distal radial fracture   Chronic kidney  disease, stage 3b (HCC)   Elevated troponin   Acute urinary retention      Consultations: Trauma services by general surgery, orthopedic, ENT Cardiology  Subjective: Feels well besides some pain in her right upper extremity/wrist area.  Discharge Exam: Vitals:   06/25/22 0801 06/25/22 0804  BP: (!) 174/68 (!) 167/65  Pulse: 66   Resp: 18   Temp: 98.4 F (36.9 C)   SpO2: 99%    Vitals:   06/24/22 2322 06/25/22 0345 06/25/22 0801 06/25/22 0804  BP: (!) 138/59 (!) 131/54 (!) 174/68 (!) 167/65  Pulse: 69 65 66   Resp: 20 20 18    Temp: 98.7 F (37.1 C) 99.1 F (37.3 C) 98.4 F (36.9 C)   TempSrc: Oral Axillary Axillary   SpO2: 94% 96% 99%   Weight:      Height:        General: Pt is alert, awake, not in acute distress Cardiovascular: RRR, S1/S2 +, no rubs, no gallops Respiratory: CTA bilaterally, no wheezing, no rhonchi Abdominal: Soft, NT, ND, bowel sounds + Extremities: no edema, no cyanosis Right upper extremity splint in place  Discharge Instructions   Allergies as of 06/25/2022       Reactions   Atorvastatin Other (See Comments)   Unknown reaction   Desmopressin Acetate Other (See Comments)   Iodinated Contrast Media Hives, Other (See Comments)   Myrbetriq [mirabegron] Other (See Comments)   "Makes me urinate more frequently"   Iodine Rash        Medication List     TAKE these medications    (feeding supplement) PROSource Plus liquid Take 30 mLs by mouth 2 (two) times daily between meals.   acetaminophen 500 MG tablet Commonly known as: TYLENOL Take 500-1,000 mg by mouth every 8 (eight) hours as needed for mild pain.   albuterol 108 (90 Base) MCG/ACT inhaler Commonly known as: VENTOLIN HFA Inhale 2 puffs into the lungs every 4 (four) hours as needed for wheezing or shortness of breath.   aspirin EC 81 MG tablet Take 1 tablet (81 mg total) by mouth every evening. Swallow whole.   Centrum Silver Ultra Womens Tabs Take 1 tablet by mouth  daily with breakfast.   AIRBORNE PO Take 1 tablet by mouth daily with breakfast.   ICAPS AREDS 2 PO Take 1 capsule by mouth daily at 12 noon.   Culturelle Caps Take 1 capsule by mouth every morning.   famotidine 40 MG tablet Commonly known as: PEPCID Take 40 mg by mouth daily.   HYDROcodone-acetaminophen 5-325 MG tablet Commonly known as: NORCO/VICODIN Take 1 tablet by mouth every 6 (six) hours as needed for severe pain.   Magnesium 250 MG Tabs Take 250 mg by mouth daily as needed (leg cramps).   metoprolol succinate 25 MG 24 hr tablet Commonly known as: TOPROL-XL Take 1 tablet (25 mg total) by mouth every evening. Kapspargo sprinkle   mirabegron ER 50 MG Tb24 tablet Commonly known as: MYRBETRIQ Take 1 tablet by mouth at bedtime.   polyethylene glycol 17 g packet Commonly known as: MIRALAX / GLYCOLAX Take 17 g by mouth daily as needed.   protein supplement shake Liqd Commonly known as: PREMIER PROTEIN Take 59.1 mLs (2 oz total) by mouth 3 (three) times daily between meals. What changed: additional instructions   REFRESH OP Place 1 drop into both eyes daily as needed (dry eyes).   senna-docusate 8.6-50 MG  tablet Commonly known as: Senokot-S Take 2 tablets by mouth at bedtime as needed for mild constipation.   Spiriva Respimat 1.25 MCG/ACT Aers Generic drug: Tiotropium Bromide Monohydrate Inhale 2 puffs into the lungs daily.   Trospium Chloride 60 MG Cp24 Take 1 capsule by mouth daily.   vitamin B-12 100 MCG tablet Commonly known as: CYANOCOBALAMIN Take 100 mcg by mouth daily.   Vitamin D-3 25 MCG (1000 UT) Caps Take 1,000 Units by mouth daily with breakfast.               Durable Medical Equipment  (From admission, onward)           Start     Ordered   06/24/22 1338  For home use only DME Walker rolling  Once       Comments: Right platform  Question Answer Comment  Walker: With 5 Inch Wheels   Patient needs a walker to treat with the  following condition Syncope   Patient needs a walker to treat with the following condition Fx wrist      06/24/22 1338   06/24/22 0946  For home use only DME Bedside commode  Once       Question:  Patient needs a bedside commode to treat with the following condition  Answer:  Syncope   06/24/22 0947            Follow-up Information     [facility]. Call.   Specialty: Cardiology Why: Cardiac monitor will be mailed to your home address that we have on file. Monitor comes with applicaiton instructions for you to use at home. If you have questions about the cardiac monitor or need help placing the monitor, please call our office Contact information: [address] [id number] mc [place] [address] [phone]        Myrene Galas, MD Follow up on 06/29/2022.   Specialty: Orthopedic Surgery Contact information: [address] [place] [address] [phone]         [facility] Follow up.   Why: (Adapt) Platform rolling walker, Bedside Commode arranged- to be delivered to room prior to discharge Contact information: [address] [facility] [place] [address] [phone]         [facility] Follow up.   Specialty: Home Health Services Why: HHPT/OT arranged- they will contact you to schedule Contact information: [address] [place] [address] [phone]         Thana Ates, MD Follow up in 1 week(s).   Specialty: Internal Medicine Contact information: [address] [place] [address] [phone]                Allergies  Allergen Reactions   Atorvastatin Other (See Comments)    Unknown reaction   Desmopressin Acetate Other (See Comments)   Iodinated Contrast Media Hives and Other (See Comments)   Myrbetriq [Mirabegron] Other (See Comments)    "Makes me urinate more frequently"   Iodine Rash    You were cared for by a  hospitalist during your hospital stay. If you have any questions about your discharge medications or the care you received while you were in the hospital after you are discharged, you can call the unit and asked to speak with the hospitalist on call if the hospitalist that took care of you is not available. Once you are discharged, your primary care physician will handle any  further medical issues. Please note that no refills for any discharge medications will be authorized once you are discharged, as it is imperative that you return to your primary care physician (or establish a relationship with a primary care physician if you do not have one) for your aftercare needs so that they can reassess your need for medications and monitor your lab values.  You were cared for by a hospitalist during your hospital stay. If you have any questions about your discharge medications or the care you received while you were in the hospital after you are discharged, you can call the unit and asked to speak with the hospitalist on call if the hospitalist that took care of you is not available. Once you are discharged, your primary care physician will handle any further medical issues. Please note that NO REFILLS for any discharge medications will be authorized once you are discharged, as it is imperative that you return to your primary care physician (or establish a relationship with a primary care physician if you do not have one) for your aftercare needs so that they can reassess your need for medications and monitor your lab values.  Please request your Prim.MD to go over all Hospital Tests and Procedure/Radiological results at the follow up, please get all Hospital records sent to your Prim MD by signing hospital release before you go home.  Get CBC, CMP, 2 view Chest X ray checked  by Primary MD during your next visit or SNF MD in 5-7 days ( we routinely change or add medications that can affect your baseline labs and  fluid status, therefore we recommend that you get the mentioned basic workup next visit with your PCP, your PCP may decide not to get them or add new tests based on their clinical decision)  On your next visit with your primary care physician please Get Medicines reviewed and adjusted.  If you experience worsening of your admission symptoms, develop shortness of breath, life threatening emergency, suicidal or homicidal thoughts you must seek medical attention immediately by calling 911 or calling your MD immediately  if symptoms less severe.  You Must read complete instructions/literature along with all the possible adverse reactions/side effects for all the Medicines you take and that have been prescribed to you. Take any new Medicines after you have completely understood and accpet all the possible adverse reactions/side effects.   Do not drive, operate heavy machinery, perform activities at heights, swimming or participation in water activities or provide baby sitting services if your were admitted for syncope or siezures until you have seen by Primary MD or a Neurologist and advised to do so again.  Do not drive when taking Pain medications.   Procedures/Studies: ECHOCARDIOGRAM COMPLETE  Result Date: 06/23/2022    ECHOCARDIOGRAM REPORT   Patient Name:   LACEE SCHWANTZ Date of Exam: 06/23/2022 Medical Rec #:  [id number]     Height:       63.0 in Accession #:    [id number]    Weight:       125.0 lb Date of Birth:  Apr 30, 1927     BSA:          1.584 m Patient Age:    89 years      BP:           120/66 mmHg Patient Gender: F             HR:           89 bpm.  Exam Location:  Inpatient Procedure: 2D Echo, Color Doppler and Cardiac Doppler Indications:    R55 Syncope  History:        Patient has prior history of Echocardiogram examinations, most                 recent 12/15/2021. Risk Factors:Hypertension and Dyslipidemia.  Sonographer:    Irving Burton Senior RDCS Referring Phys: Charlsie Quest  Sonographer  Comments: Suboptimal apical windows due to small rib spaces. IMPRESSIONS  1. Left ventricular ejection fraction, by estimation, is 65 to 70%. The left ventricle has normal function. The left ventricle has no regional wall motion abnormalities. There is mild concentric left ventricular hypertrophy. Left ventricular diastolic parameters are consistent with Grade I diastolic dysfunction (impaired relaxation).  2. Right ventricular systolic function is normal. The right ventricular size is normal. There is normal pulmonary artery systolic pressure.  3. A small pericardial effusion is present. The pericardial effusion is posterior to the left ventricle.  4. The mitral valve is abnormal. Mild mitral valve regurgitation. Mild mitral stenosis. The mean mitral valve gradient is 5.0 mmHg. Moderate mitral annular calcification.  5. Fused left and non cusps. The aortic valve is calcified. There is moderate calcification of the aortic valve. Aortic valve regurgitation is mild. Moderate aortic valve stenosis. Aortic valve area, by VTI measures 1.09 cm. Aortic valve mean gradient measures 21.0 mmHg. Aortic valve Vmax measures 3.22 m/s.  6. The inferior vena cava is normal in size with greater than 50% respiratory variability, suggesting right atrial pressure of 3 mmHg. FINDINGS  Left Ventricle: Left ventricular ejection fraction, by estimation, is 65 to 70%. The left ventricle has normal function. The left ventricle has no regional wall motion abnormalities. The left ventricular internal cavity size was normal in size. There is  mild concentric left ventricular hypertrophy. Left ventricular diastolic parameters are consistent with Grade I diastolic dysfunction (impaired relaxation). Right Ventricle: The right ventricular size is normal. No increase in right ventricular wall thickness. Right ventricular systolic function is normal. There is normal pulmonary artery systolic pressure. The tricuspid regurgitant velocity is 2.31 m/s,  and  with an assumed right atrial pressure of 3 mmHg, the estimated right ventricular systolic pressure is 24.3 mmHg. Left Atrium: Left atrial size was normal in size. Right Atrium: Right atrial size was normal in size. Pericardium: A small pericardial effusion is present. The pericardial effusion is posterior to the left ventricle. Mitral Valve: The mitral valve is abnormal. There is moderate thickening of the mitral valve leaflet(s). There is moderate calcification of the mitral valve leaflet(s). Moderate mitral annular calcification. Mild mitral valve regurgitation. Mild mitral valve stenosis. MV peak gradient, 8.4 mmHg. The mean mitral valve gradient is 5.0 mmHg. Tricuspid Valve: The tricuspid valve is normal in structure. Tricuspid valve regurgitation is mild. Aortic Valve: Fused left and non cusps. The aortic valve is calcified. There is moderate calcification of the aortic valve. Aortic valve regurgitation is mild. Moderate aortic stenosis is present. Aortic valve mean gradient measures 21.0 mmHg. Aortic valve peak gradient measures 41.5 mmHg. Aortic valve area, by VTI measures 1.09 cm. Pulmonic Valve: The pulmonic valve was normal in structure. Pulmonic valve regurgitation is trivial. Aorta: The aortic root and ascending aorta are structurally normal, with no evidence of dilitation. Venous: The inferior vena cava is normal in size with greater than 50% respiratory variability, suggesting right atrial pressure of 3 mmHg. IAS/Shunts: The interatrial septum was not well visualized.  LEFT VENTRICLE PLAX 2D LVIDd:  3.10 cm   Diastology LVIDs:         1.90 cm   LV e' medial:    5.33 cm/s LV PW:         1.00 cm   LV E/e' medial:  20.8 LV IVS:        1.10 cm   LV e' lateral:   4.90 cm/s LVOT diam:     2.10 cm   LV E/e' lateral: 22.7 LV SV:         68 LV SV Index:   43 LVOT Area:     3.46 cm  RIGHT VENTRICLE RV S prime:     14.70 cm/s TAPSE (M-mode): 2.1 cm LEFT ATRIUM             Index        RIGHT ATRIUM            Index LA diam:        2.70 cm 1.71 cm/m   RA Area:     15.60 cm LA Vol (A2C):   31.0 ml 19.58 ml/m  RA Volume:   41.40 ml  26.14 ml/m LA Vol (A4C):   42.7 ml 26.97 ml/m LA Biplane Vol: 36.4 ml 22.99 ml/m  AORTIC VALVE AV Area (Vmax):    1.10 cm AV Area (Vmean):   1.27 cm AV Area (VTI):     1.09 cm AV Vmax:           322.00 cm/s AV Vmean:          204.000 cm/s AV VTI:            0.628 m AV Peak Grad:      41.5 mmHg AV Mean Grad:      21.0 mmHg LVOT Vmax:         102.00 cm/s LVOT Vmean:        75.000 cm/s LVOT VTI:          0.197 m LVOT/AV VTI ratio: 0.31  AORTA Ao Root diam: 3.00 cm Ao Asc diam:  3.00 cm MITRAL VALVE                TRICUSPID VALVE MV Area (PHT): 1.54 cm     TR Peak grad:   21.3 mmHg MV Peak grad:  8.4 mmHg     TR Vmax:        231.00 cm/s MV Mean grad:  5.0 mmHg MV Vmax:       1.45 m/s     SHUNTS MV Vmean:      103.0 cm/s   Systemic VTI:  0.20 m MV Decel Time: 494 msec     Systemic Diam: 2.10 cm MV E velocity: 111.00 cm/s MV A velocity: 144.00 cm/s MV E/A ratio:  0.77 Charlton Haws MD Electronically signed by Charlton Haws MD Signature Date/Time: 06/23/2022/11:49:04 AM    Final    CT CERVICAL SPINE WO CONTRAST  Result Date: 06/22/2022 CLINICAL DATA:  fall with facial fractures, rule out C spine injury EXAM: CT CERVICAL SPINE WITHOUT CONTRAST TECHNIQUE: Multidetector CT imaging of the cervical spine was performed without intravenous contrast. Multiplanar CT image reconstructions were also generated. RADIATION DOSE REDUCTION: This exam was performed according to the departmental dose-optimization program which includes automated exposure control, adjustment of the mA and/or kV according to patient size and/or use of iterative reconstruction technique. COMPARISON:  None Available. FINDINGS: Alignment: Normal Skull base and vertebrae: No acute fracture. No primary bone lesion or focal pathologic process.  Soft tissues and spinal canal: No prevertebral fluid or swelling. No visible canal  hematoma. Disc levels: Mild bilateral degenerative facet disease and diffuse degenerative disc disease. Congenital fusion across the C4-5 disc space. No visible disc herniation. Upper chest: No acute findings Other: None IMPRESSION: No acute bony abnormality. Electronically Signed   By: Charlett Nose M.D.   On: 06/22/2022 23:23   DG Chest 1 View  Result Date: 06/22/2022 CLINICAL DATA:  Syncopal episode and fall today. EXAM: CHEST  1 VIEW COMPARISON:  10/18/2021 FINDINGS: Heart size and pulmonary vascularity are normal. Linear fibrosis or atelectasis in the lung bases, similar to prior study. No airspace disease or consolidation. No pleural effusions. No pneumothorax. Mediastinal contours appear intact. Calcification of the aorta. Degenerative changes in the spine and shoulders. IMPRESSION: Linear atelectasis or fibrosis in the lung bases. No evidence of active pulmonary disease. Electronically Signed   By: Burman Nieves M.D.   On: 06/22/2022 17:58   DG Hip Unilat W or Wo Pelvis 2-3 Views Right  Result Date: 06/22/2022 CLINICAL DATA:  Right hip pain after syncopal episode and fall today. EXAM: DG HIP (WITH OR WITHOUT PELVIS) 2-3V RIGHT COMPARISON:  None Available. FINDINGS: Mild degenerative changes in the hips. Pelvis and right hip appear intact. No evidence of acute fracture or dislocation. No focal bone lesion or bone destruction. SI joints and symphysis pubis are not displaced. IMPRESSION: Degenerative changes in the right hip. No acute displaced fractures identified. Electronically Signed   By: Burman Nieves M.D.   On: 06/22/2022 17:57   CT Maxillofacial Wo Contrast  Result Date: 06/22/2022 CLINICAL DATA:  Blunt facial trauma with left zygomatic arch pain and ecchymosis. Left jaw pain. Syncope or presyncope. EXAM: CT MAXILLOFACIAL WITHOUT CONTRAST TECHNIQUE: Multidetector CT imaging of the maxillofacial structures was performed. Multiplanar CT image reconstructions were also generated. RADIATION  DOSE REDUCTION: This exam was performed according to the departmental dose-optimization program which includes automated exposure control, adjustment of the mA and/or kV according to patient size and/or use of iterative reconstruction technique. COMPARISON:  CT head 06/01/2021 FINDINGS: Osseous: Acute comminuted and depressed fractures of the zygomatic arch with fracture lines extending to the temporomandibular joint surface. Acute comminuted and depressed fractures of the lateral left orbital wall with displacement of a tiny fragment into the extraconal intraorbital space, partially displacing the rectus lateralis muscle. Mildly depressed fracture of the posterior aspect of the inferior orbital wall with gas demonstrated in the pterygoid canal. Comminuted and depressed fractures of the lateral left maxillary antral wall extending to the upper maxilla. The mandible appears intact. Degenerative changes in the temporomandibular joints. Orbits: The globes and extraocular muscles appear intact and symmetrical. Medial displacement of the left lateral rectus muscle as above. Sinuses: Air-fluid level in the left maxillary antrum. Soft tissues: Soft tissue infiltration consistent with contusion or hematoma in the lateral aspect of the left orbital and facial region centered mostly around the zygomatic arch fractures. Limited intracranial: See additional report of CT head same date. IMPRESSION: 1. Multiple acute and depressed/displaced left orbital and facial fractures as discussed, including comminuted zygomatic arch fractures extending to the temporomandibular joint surface, a lateral orbital fracture with displaced fragment causing medial displacement of the lateral rectus muscle, and orbital/maxillary antral fractures extending to the pterygoid canal with gas in the canal. 2. Associated posttraumatic air-fluid level in the left maxillary antrum. 1. Soft tissue contusion over the left side of the face and zygomatic  region. Electronically Signed   By: Chrissie Noa  Andria Meuse M.D.   On: 06/22/2022 17:56   CT HEAD WO CONTRAST  Result Date: 06/22/2022 CLINICAL DATA:  Syncope or presyncope with cerebrovascular cause suspected. Facial trauma. Left jaw pain. EXAM: CT HEAD WITHOUT CONTRAST TECHNIQUE: Contiguous axial images were obtained from the base of the skull through the vertex without intravenous contrast. RADIATION DOSE REDUCTION: This exam was performed according to the departmental dose-optimization program which includes automated exposure control, adjustment of the mA and/or kV according to patient size and/or use of iterative reconstruction technique. COMPARISON:  MRI brain 10/19/2021.  CT head 06/01/2021 FINDINGS: Brain: Diffuse cerebral atrophy. Ventricular dilatation consistent with central atrophy. Low-attenuation changes in the deep white matter consistent with small vessel ischemia. No abnormal extra-axial fluid collections. No mass effect or midline shift. Gray-white matter junctions are distinct. Basal cisterns are not effaced. No acute intracranial hemorrhage. Vascular: No hyperdense vessel or unexpected calcification. Skull: Normal. Negative for fracture or focal lesion. Sinuses/Orbits: Air-fluid level in the left maxillary antrum with multiple left facial fractures. See additional report of CT maxillofacial. Mastoid air cells are clear. Other: None. IMPRESSION: 1. No acute intracranial abnormalities. Chronic atrophy and small vessel ischemic changes. 2. Left facial fractures with air-fluid level in the left maxillary antrum. See additional report of CT maxillofacial same date. Electronically Signed   By: Burman Nieves M.D.   On: 06/22/2022 17:37   DG Wrist Complete Right  Result Date: 06/22/2022 CLINICAL DATA:  Pain after fall EXAM: RIGHT WRIST - COMPLETE 4 VIEW COMPARISON:  None Available. FINDINGS: Nondisplaced fracture of the extreme distal radius involving the radiocarpal joint and the radial styloid. No  additional fracture or dislocation. Osteopenia. Chondrocalcinosis. Soft tissue swelling. IMPRESSION: Nondisplaced distal radial fracture involving the base of the radial styloid. Fracture line extends to the radiocarpal joint. Chondrocalcinosis and soft tissue swelling. Electronically Signed   By: Karen Kays M.D.   On: 06/22/2022 16:11     The results of significant diagnostics from this hospitalization (including imaging, microbiology, ancillary and laboratory) are listed below for reference.     Microbiology: Recent Results (from the past 240 hour(s))  SARS Coronavirus 2 by RT PCR (hospital order, performed in [facility] hospital lab) *cepheid single result test* Anterior Nasal Swab     Status: None   Collection Time: 06/23/22  5:55 PM   Specimen: Anterior Nasal Swab  Result Value Ref Range Status   SARS Coronavirus 2 by RT PCR NEGATIVE NEGATIVE Final    Comment: Performed at [facility], [address], [place], [place] [address]     Labs: BNP (last 3 results) Recent Labs    10/18/21 2221  BNP 189.5*   Basic Metabolic Panel: Recent Labs  Lab 06/22/22 1544 06/22/22 2343  NA 133* 133*  K 4.1 4.4  CL 98 101  CO2 22 23  GLUCOSE 140* 142*  BUN 23 22  CREATININE 1.27* 1.15*  CALCIUM 9.4 8.5*   Liver Function Tests: No results for input(s): "AST", "ALT", "ALKPHOS", "BILITOT", "PROT", "ALBUMIN" in the last 168 hours. No results for input(s): "LIPASE", "AMYLASE" in the last 168 hours. No results for input(s): "AMMONIA" in the last 168 hours. CBC: Recent Labs  Lab 06/22/22 1544 06/22/22 2343  WBC 10.1 8.1  HGB 13.4 12.1  HCT 36.9 33.4*  MCV 82.7 83.7  PLT 184 171   Cardiac Enzymes: No results for input(s): "CKTOTAL", "CKMB", "CKMBINDEX", "TROPONINI" in the last 168 hours. BNP: Invalid input(s): "POCBNP" CBG: Recent Labs  Lab 06/22/22 1547 06/23/22  0272 06/24/22 0635 06/25/22 0555  GLUCAP 135* 106* 108* 113*   D-Dimer No results for input(s):  "DDIMER" in the last 72 hours. Hgb A1c No results for input(s): "HGBA1C" in the last 72 hours. Lipid Profile No results for input(s): "CHOL", "HDL", "LDLCALC", "TRIG", "CHOLHDL", "LDLDIRECT" in the last 72 hours. Thyroid function studies No results for input(s): "TSH", "T4TOTAL", "T3FREE", "THYROIDAB" in the last 72 hours.  Invalid input(s): "FREET3" Anemia work up No results for input(s): "VITAMINB12", "FOLATE", "FERRITIN", "TIBC", "IRON", "RETICCTPCT" in the last 72 hours. Urinalysis    Component Value Date/Time   COLORURINE YELLOW 06/22/2022 2047   APPEARANCEUR CLEAR 06/22/2022 2047   LABSPEC 1.014 06/22/2022 2047   PHURINE 6.0 06/22/2022 2047   GLUCOSEU NEGATIVE 06/22/2022 2047   HGBUR NEGATIVE 06/22/2022 2047   BILIRUBINUR NEGATIVE 06/22/2022 2047   BILIRUBINUR neg 08/24/2011 1746   KETONESUR 5 (A) 06/22/2022 2047   PROTEINUR 30 (A) 06/22/2022 2047   UROBILINOGEN negative 08/24/2011 1746   UROBILINOGEN 0.2 09/30/2010 1626   NITRITE NEGATIVE 06/22/2022 2047   LEUKOCYTESUR NEGATIVE 06/22/2022 2047   Sepsis Labs Recent Labs  Lab 06/22/22 1544 06/22/22 2343  WBC 10.1 8.1   Microbiology Recent Results (from the past 240 hour(s))  SARS Coronavirus 2 by RT PCR (hospital order, performed in [facility] hospital lab) *cepheid single result test* Anterior Nasal Swab     Status: None   Collection Time: 06/23/22  5:55 PM   Specimen: Anterior Nasal Swab  Result Value Ref Range Status   SARS Coronavirus 2 by RT PCR NEGATIVE NEGATIVE Final    Comment: Performed at [facility], [address] [place], [place] [address]     Time coordinating discharge:  I have spent 35 minutes face to face with the patient and on the ward discussing the patients care, assessment, plan and disposition with other care givers. >50% of the time was devoted counseling the patient about the risks and benefits of treatment/Discharge disposition and coordinating care.   SIGNED:   Dimple Nanas, MD  [facility] 06/25/2022, 10:03 AM   If 7PM-7AM, please contact night-coverage

## 2022-06-25 NOTE — Progress Notes (Signed)
Physical Therapy Treatment Patient Details Name: [name] MRN: [id number] DOB: 26-Feb-1927 Today's Date: 06/25/2022   History of Present Illness 89 y.o. female who presented to the ED 6/5 after a syncopal event at home resulting in multiple injuries. Pt with R wrist fx, L facial fx, and orthostatic hypotension. PMH: CKD stage IIIb, moderate aortic stenosis, HTN, HLD, TIA, esophageal dysmotility.    PT Comments    Pt received in supine, agreeable to therapy session with emphasis on compliance with RUE elbow WB status, safe use of R PFRW, safe UE placement with transfers, RUE (hand) elevation at rest due to edema, and benefits of AD use for pt and caregiver safety and fall risk prevention. Pt VSS but still c/o intermittent dizziness, possible post-concussive symptoms present given noted STM deficit (PTA unsure of pt cognitive baseline) and intermittent c/o dizziness with stable BP. Pt needing decreased assist, min guard to minA this date for functional mobility tasks, but with memory deficits and RUE elbow WB status, pt will need constant supervision/assist initially upon return home. Pt continues to benefit from PT services to progress toward functional mobility goals.   Recommendations for follow up therapy are one component of a multi-disciplinary discharge planning process, led by the attending physician.  Recommendations may be updated based on patient status, additional functional criteria and insurance authorization.  Follow Up Recommendations       Assistance Recommended at Discharge Frequent or constant Supervision/Assistance  Patient can return home with the following A lot of help with walking and/or transfers;A little help with bathing/dressing/bathroom;Assistance with cooking/housework;Assistance with feeding;Direct supervision/assist for medications management;Direct supervision/assist for financial management;Assist for transportation;Help with stairs or ramp for entrance    Equipment Recommendations  BSC/3in1;Other (comment) (R platform RW)    Recommendations for Other Services       Precautions / Restrictions Precautions Precautions: Fall Precaution Comments: watch BP -better with TED hose Required Braces or Orthoses: Other Brace Other Brace: thumb spica for R UE Restrictions Weight Bearing Restrictions: Yes RUE Weight Bearing: Weight bear through elbow only     Mobility  Bed Mobility Overal bed mobility: Needs Assistance Bed Mobility: Supine to Sit     Supine to sit: Min guard     General bed mobility comments: min cues for RUE elbow WB restriction, min guard for safety and to prevent R WB through her wrist.    Transfers Overall transfer level: Needs assistance Equipment used: Right platform walker Transfers: Sit to/from Stand Sit to Stand: Min assist           General transfer comment: from EOB>R platform RW and RW<>recliner, cues needed for safe LUE placement each transfer as pt with decreased recall of cues.    Ambulation/Gait Ambulation/Gait assistance: Min assist, Min guard Gait Distance (Feet): 100 Feet Assistive device: Right platform walker Gait Pattern/deviations: Step-through pattern, Decreased stride length, Drifts right/left       General Gait Details: Pt requires frequent verbal/tactile cues for posture, proximity to RW and directional navigation, with some manual assist to steer PFRW around obstacles in narrow spaces in the room. minA when turning or avoiding objects in the room, otherwise min guard assist.   Stairs             Wheelchair Mobility    Modified Rankin (Stroke Patients Only)       Balance Overall balance assessment: Needs assistance Sitting-balance support: Single extremity supported, Feet supported Sitting balance-Leahy Scale: Fair     Standing balance support: Bilateral upper extremity  supported Standing balance-Leahy Scale: Poor Standing balance comment: fair static standing  with R PFRW, poor dynamic balance needs external assist intermittently                            Cognition Arousal/Alertness: Awake/alert Behavior During Therapy: Impulsive Overall Cognitive Status: Impaired/Different from baseline Area of Impairment: Following commands, Safety/judgement, Awareness, Problem solving, Attention, Memory, Orientation                 Orientation Level: Disoriented to, Situation Current Attention Level: Sustained Memory: Decreased recall of precautions, Decreased short-term memory Following Commands: Follows one step commands with increased time, Follows multi-step commands with increased time Safety/Judgement: Decreased awareness of safety, Decreased awareness of deficits Awareness: Intellectual Problem Solving: Slow processing, Difficulty sequencing, Requires verbal cues General Comments: Pt c/o intermittent dizziness, but not lightheaded, slow to process cues and mildly impulsive needing frequent hands-on assist for safety. Improved R PF RW management this date but still needing assist for safety. Poor recall of cues for safe LUE placement when preparing to stand/sit.        Exercises Other Exercises Other Exercises: static standing with single UE support x5 mins for BLE strengthening (during functional task at sink) Other Exercises: IS x 20 reps (pt achieves ~500-750 mL) encouraged hourly x10, pt needs reminder when to stop    General Comments General comments (skin integrity, edema, etc.): SpO2/HR WFL on RA. Intermittent dizziness but resolves with time. TED hose donned throughout. Pt eager to brush her teeth and with improved standing tolerance during this task.      Pertinent Vitals/Pain Pain Assessment Pain Assessment: Faces Faces Pain Scale: Hurts even more Pain Location: R wrist, R side of face, generalized Pain Descriptors / Indicators: Grimacing, Guarding, Discomfort, Sore Pain Intervention(s): Monitored during session,  Repositioned     PT Goals (current goals can now be found in the care plan section) Acute Rehab PT Goals Patient Stated Goal: go home PT Goal Formulation: With patient Time For Goal Achievement: 07/07/22 Progress towards PT goals: Progressing toward goals    Frequency    Min 1X/week      PT Plan Current plan remains appropriate       AM-PAC PT "6 Clicks" Mobility   Outcome Measure  Help needed turning from your back to your side while in a flat bed without using bedrails?: A Little Help needed moving from lying on your back to sitting on the side of a flat bed without using bedrails?: A Little Help needed moving to and from a bed to a chair (including a wheelchair)?: A Little Help needed standing up from a chair using your arms (e.g., wheelchair or bedside chair)?: A Little Help needed to walk in hospital room?: A Little Help needed climbing 3-5 steps with a railing? : A Lot 6 Click Score: 17    End of Session Equipment Utilized During Treatment: Gait belt Activity Tolerance: Patient tolerated treatment well Patient left: in chair;with call bell/phone within reach;with chair alarm set;with family/visitor present;Other (comment) (daughter entering the room, case mgr entering the room to speak with pt/daughter) Nurse Communication: Mobility status;Other (comment) (pt does need 3in1 for home, pt requesting ice pack for RUE, R hand edema, pt needs to elevate wrist over elbow/heart when resting) PT Visit Diagnosis: Unsteadiness on feet (R26.81);Other abnormalities of gait and mobility (R26.89);Muscle weakness (generalized) (M62.81);History of falling (Z91.81);Difficulty in walking, not elsewhere classified (R26.2);Dizziness and giddiness (R42);Pain Pain - Right/Left: Right  Pain - part of body: Hand     Time: 1610-9604 PT Time Calculation (min) (ACUTE ONLY): 39 min  Charges:  $Gait Training: 8-22 mins $Therapeutic Exercise: 8-22 mins $Therapeutic Activity: 8-22 mins                      Drucella Karbowski P., PTA Acute Rehabilitation Services Secure Chat Preferred 9a-5:30pm Office: [phone]    Dorathy Kinsman [facility] 06/25/2022, 11:52 AM

## 2022-06-25 NOTE — Progress Notes (Addendum)
Received referral for a BSC. Met with pt and daughter. Per daughter, pt declined the [facility] yesterday but she needs it. Contacted Jasmine with [facility] for DME. Notified Denyse Amass with [facility] that pt has been DC.

## 2022-06-27 DIAGNOSIS — R7989 Other specified abnormal findings of blood chemistry: Secondary | ICD-10-CM | POA: Diagnosis not present

## 2022-06-27 DIAGNOSIS — N1832 Chronic kidney disease, stage 3b: Secondary | ICD-10-CM | POA: Diagnosis not present

## 2022-06-27 DIAGNOSIS — S0282XA Fracture of other specified skull and facial bones, left side, initial encounter for closed fracture: Secondary | ICD-10-CM | POA: Diagnosis not present

## 2022-06-27 DIAGNOSIS — S52509A Unspecified fracture of the lower end of unspecified radius, initial encounter for closed fracture: Secondary | ICD-10-CM | POA: Diagnosis not present

## 2022-06-27 DIAGNOSIS — R55 Syncope and collapse: Secondary | ICD-10-CM | POA: Diagnosis not present

## 2022-06-28 DIAGNOSIS — N1832 Chronic kidney disease, stage 3b: Secondary | ICD-10-CM | POA: Diagnosis not present

## 2022-06-28 DIAGNOSIS — S0240FD Zygomatic fracture, left side, subsequent encounter for fracture with routine healing: Secondary | ICD-10-CM | POA: Diagnosis not present

## 2022-06-28 DIAGNOSIS — I1310 Hypertensive heart and chronic kidney disease without heart failure, with stage 1 through stage 4 chronic kidney disease, or unspecified chronic kidney disease: Secondary | ICD-10-CM | POA: Diagnosis not present

## 2022-06-28 DIAGNOSIS — I2510 Atherosclerotic heart disease of native coronary artery without angina pectoris: Secondary | ICD-10-CM | POA: Diagnosis not present

## 2022-06-28 DIAGNOSIS — D631 Anemia in chronic kidney disease: Secondary | ICD-10-CM | POA: Diagnosis not present

## 2022-06-28 DIAGNOSIS — S02842D Fracture of lateral orbital wall, left side, subsequent encounter for fracture with routine healing: Secondary | ICD-10-CM | POA: Diagnosis not present

## 2022-06-28 DIAGNOSIS — I3139 Other pericardial effusion (noninflammatory): Secondary | ICD-10-CM | POA: Diagnosis not present

## 2022-06-28 DIAGNOSIS — M1611 Unilateral primary osteoarthritis, right hip: Secondary | ICD-10-CM | POA: Diagnosis not present

## 2022-06-28 DIAGNOSIS — S52514D Nondisplaced fracture of right radial styloid process, subsequent encounter for closed fracture with routine healing: Secondary | ICD-10-CM | POA: Diagnosis not present

## 2022-06-29 DIAGNOSIS — S52514D Nondisplaced fracture of right radial styloid process, subsequent encounter for closed fracture with routine healing: Secondary | ICD-10-CM | POA: Diagnosis not present

## 2022-06-30 DIAGNOSIS — D631 Anemia in chronic kidney disease: Secondary | ICD-10-CM | POA: Diagnosis not present

## 2022-06-30 DIAGNOSIS — N1832 Chronic kidney disease, stage 3b: Secondary | ICD-10-CM | POA: Diagnosis not present

## 2022-06-30 DIAGNOSIS — S0240FD Zygomatic fracture, left side, subsequent encounter for fracture with routine healing: Secondary | ICD-10-CM | POA: Diagnosis not present

## 2022-06-30 DIAGNOSIS — I3139 Other pericardial effusion (noninflammatory): Secondary | ICD-10-CM | POA: Diagnosis not present

## 2022-06-30 DIAGNOSIS — I2510 Atherosclerotic heart disease of native coronary artery without angina pectoris: Secondary | ICD-10-CM | POA: Diagnosis not present

## 2022-06-30 DIAGNOSIS — I1310 Hypertensive heart and chronic kidney disease without heart failure, with stage 1 through stage 4 chronic kidney disease, or unspecified chronic kidney disease: Secondary | ICD-10-CM | POA: Diagnosis not present

## 2022-06-30 DIAGNOSIS — M1611 Unilateral primary osteoarthritis, right hip: Secondary | ICD-10-CM | POA: Diagnosis not present

## 2022-06-30 DIAGNOSIS — S02842D Fracture of lateral orbital wall, left side, subsequent encounter for fracture with routine healing: Secondary | ICD-10-CM | POA: Diagnosis not present

## 2022-06-30 DIAGNOSIS — S52514D Nondisplaced fracture of right radial styloid process, subsequent encounter for closed fracture with routine healing: Secondary | ICD-10-CM | POA: Diagnosis not present

## 2022-07-01 ENCOUNTER — Telehealth: Payer: Self-pay | Admitting: Cardiology

## 2022-07-01 DIAGNOSIS — S0285XD Fracture of orbit, unspecified, subsequent encounter for fracture with routine healing: Secondary | ICD-10-CM | POA: Diagnosis not present

## 2022-07-01 DIAGNOSIS — S0240FD Zygomatic fracture, left side, subsequent encounter for fracture with routine healing: Secondary | ICD-10-CM | POA: Diagnosis not present

## 2022-07-01 DIAGNOSIS — I5030 Unspecified diastolic (congestive) heart failure: Secondary | ICD-10-CM | POA: Diagnosis not present

## 2022-07-01 DIAGNOSIS — N1832 Chronic kidney disease, stage 3b: Secondary | ICD-10-CM | POA: Diagnosis not present

## 2022-07-01 DIAGNOSIS — S52501D Unspecified fracture of the lower end of right radius, subsequent encounter for closed fracture with routine healing: Secondary | ICD-10-CM | POA: Diagnosis not present

## 2022-07-01 DIAGNOSIS — R55 Syncope and collapse: Secondary | ICD-10-CM | POA: Diagnosis not present

## 2022-07-01 NOTE — Telephone Encounter (Signed)
Patient's daughter called stating a heart monitor was sent to the house for her mother to wear, she is wondering it she can bring her to the office so someone can place the monitor on her mother.

## 2022-07-04 DIAGNOSIS — M1611 Unilateral primary osteoarthritis, right hip: Secondary | ICD-10-CM | POA: Diagnosis not present

## 2022-07-04 DIAGNOSIS — R55 Syncope and collapse: Secondary | ICD-10-CM

## 2022-07-04 DIAGNOSIS — R002 Palpitations: Secondary | ICD-10-CM | POA: Diagnosis not present

## 2022-07-04 DIAGNOSIS — I2510 Atherosclerotic heart disease of native coronary artery without angina pectoris: Secondary | ICD-10-CM | POA: Diagnosis not present

## 2022-07-04 DIAGNOSIS — N1832 Chronic kidney disease, stage 3b: Secondary | ICD-10-CM | POA: Diagnosis not present

## 2022-07-04 DIAGNOSIS — I499 Cardiac arrhythmia, unspecified: Secondary | ICD-10-CM | POA: Diagnosis not present

## 2022-07-04 DIAGNOSIS — R42 Dizziness and giddiness: Secondary | ICD-10-CM

## 2022-07-04 DIAGNOSIS — I3139 Other pericardial effusion (noninflammatory): Secondary | ICD-10-CM | POA: Diagnosis not present

## 2022-07-04 DIAGNOSIS — I1310 Hypertensive heart and chronic kidney disease without heart failure, with stage 1 through stage 4 chronic kidney disease, or unspecified chronic kidney disease: Secondary | ICD-10-CM | POA: Diagnosis not present

## 2022-07-04 DIAGNOSIS — S0240FD Zygomatic fracture, left side, subsequent encounter for fracture with routine healing: Secondary | ICD-10-CM | POA: Diagnosis not present

## 2022-07-04 DIAGNOSIS — D631 Anemia in chronic kidney disease: Secondary | ICD-10-CM | POA: Diagnosis not present

## 2022-07-04 DIAGNOSIS — S52514D Nondisplaced fracture of right radial styloid process, subsequent encounter for closed fracture with routine healing: Secondary | ICD-10-CM | POA: Diagnosis not present

## 2022-07-04 DIAGNOSIS — S02842D Fracture of lateral orbital wall, left side, subsequent encounter for fracture with routine healing: Secondary | ICD-10-CM | POA: Diagnosis not present

## 2022-07-04 NOTE — Telephone Encounter (Signed)
Pt's daughter Bjorn Loser, calling to follow up on getting a callback to let her know if she can bring pt in to have heart monitor put on today. She stated it's been sitting for a week now and she's unable to do it. Please advise

## 2022-07-04 NOTE — Telephone Encounter (Signed)
Patients daughter will bring her to the office today to have monitor applied

## 2022-07-05 DIAGNOSIS — R55 Syncope and collapse: Secondary | ICD-10-CM | POA: Diagnosis not present

## 2022-07-05 DIAGNOSIS — R002 Palpitations: Secondary | ICD-10-CM | POA: Diagnosis not present

## 2022-07-08 DIAGNOSIS — S52514D Nondisplaced fracture of right radial styloid process, subsequent encounter for closed fracture with routine healing: Secondary | ICD-10-CM | POA: Diagnosis not present

## 2022-07-08 DIAGNOSIS — S0240FD Zygomatic fracture, left side, subsequent encounter for fracture with routine healing: Secondary | ICD-10-CM | POA: Diagnosis not present

## 2022-07-08 DIAGNOSIS — I3139 Other pericardial effusion (noninflammatory): Secondary | ICD-10-CM | POA: Diagnosis not present

## 2022-07-08 DIAGNOSIS — N1832 Chronic kidney disease, stage 3b: Secondary | ICD-10-CM | POA: Diagnosis not present

## 2022-07-08 DIAGNOSIS — M1611 Unilateral primary osteoarthritis, right hip: Secondary | ICD-10-CM | POA: Diagnosis not present

## 2022-07-08 DIAGNOSIS — D631 Anemia in chronic kidney disease: Secondary | ICD-10-CM | POA: Diagnosis not present

## 2022-07-08 DIAGNOSIS — I1310 Hypertensive heart and chronic kidney disease without heart failure, with stage 1 through stage 4 chronic kidney disease, or unspecified chronic kidney disease: Secondary | ICD-10-CM | POA: Diagnosis not present

## 2022-07-08 DIAGNOSIS — S02842D Fracture of lateral orbital wall, left side, subsequent encounter for fracture with routine healing: Secondary | ICD-10-CM | POA: Diagnosis not present

## 2022-07-08 DIAGNOSIS — I2510 Atherosclerotic heart disease of native coronary artery without angina pectoris: Secondary | ICD-10-CM | POA: Diagnosis not present

## 2022-07-12 DIAGNOSIS — S0240FD Zygomatic fracture, left side, subsequent encounter for fracture with routine healing: Secondary | ICD-10-CM | POA: Diagnosis not present

## 2022-07-12 DIAGNOSIS — I2510 Atherosclerotic heart disease of native coronary artery without angina pectoris: Secondary | ICD-10-CM | POA: Diagnosis not present

## 2022-07-12 DIAGNOSIS — M1611 Unilateral primary osteoarthritis, right hip: Secondary | ICD-10-CM | POA: Diagnosis not present

## 2022-07-12 DIAGNOSIS — S02842D Fracture of lateral orbital wall, left side, subsequent encounter for fracture with routine healing: Secondary | ICD-10-CM | POA: Diagnosis not present

## 2022-07-12 DIAGNOSIS — S52514D Nondisplaced fracture of right radial styloid process, subsequent encounter for closed fracture with routine healing: Secondary | ICD-10-CM | POA: Diagnosis not present

## 2022-07-12 DIAGNOSIS — D631 Anemia in chronic kidney disease: Secondary | ICD-10-CM | POA: Diagnosis not present

## 2022-07-12 DIAGNOSIS — N1832 Chronic kidney disease, stage 3b: Secondary | ICD-10-CM | POA: Diagnosis not present

## 2022-07-12 DIAGNOSIS — I3139 Other pericardial effusion (noninflammatory): Secondary | ICD-10-CM | POA: Diagnosis not present

## 2022-07-12 DIAGNOSIS — I1310 Hypertensive heart and chronic kidney disease without heart failure, with stage 1 through stage 4 chronic kidney disease, or unspecified chronic kidney disease: Secondary | ICD-10-CM | POA: Diagnosis not present

## 2022-07-18 DIAGNOSIS — S02842D Fracture of lateral orbital wall, left side, subsequent encounter for fracture with routine healing: Secondary | ICD-10-CM | POA: Diagnosis not present

## 2022-07-18 DIAGNOSIS — S52514D Nondisplaced fracture of right radial styloid process, subsequent encounter for closed fracture with routine healing: Secondary | ICD-10-CM | POA: Diagnosis not present

## 2022-07-18 DIAGNOSIS — I3139 Other pericardial effusion (noninflammatory): Secondary | ICD-10-CM | POA: Diagnosis not present

## 2022-07-18 DIAGNOSIS — M1611 Unilateral primary osteoarthritis, right hip: Secondary | ICD-10-CM | POA: Diagnosis not present

## 2022-07-18 DIAGNOSIS — I2510 Atherosclerotic heart disease of native coronary artery without angina pectoris: Secondary | ICD-10-CM | POA: Diagnosis not present

## 2022-07-18 DIAGNOSIS — S0240FD Zygomatic fracture, left side, subsequent encounter for fracture with routine healing: Secondary | ICD-10-CM | POA: Diagnosis not present

## 2022-07-18 DIAGNOSIS — D631 Anemia in chronic kidney disease: Secondary | ICD-10-CM | POA: Diagnosis not present

## 2022-07-18 DIAGNOSIS — I1310 Hypertensive heart and chronic kidney disease without heart failure, with stage 1 through stage 4 chronic kidney disease, or unspecified chronic kidney disease: Secondary | ICD-10-CM | POA: Diagnosis not present

## 2022-07-18 DIAGNOSIS — N1832 Chronic kidney disease, stage 3b: Secondary | ICD-10-CM | POA: Diagnosis not present

## 2022-07-19 ENCOUNTER — Encounter (INDEPENDENT_AMBULATORY_CARE_PROVIDER_SITE_OTHER): Payer: Medicare PPO | Admitting: Ophthalmology

## 2022-07-19 DIAGNOSIS — H35033 Hypertensive retinopathy, bilateral: Secondary | ICD-10-CM | POA: Diagnosis not present

## 2022-07-19 DIAGNOSIS — I10 Essential (primary) hypertension: Secondary | ICD-10-CM

## 2022-07-19 DIAGNOSIS — H43813 Vitreous degeneration, bilateral: Secondary | ICD-10-CM | POA: Diagnosis not present

## 2022-07-19 DIAGNOSIS — H353231 Exudative age-related macular degeneration, bilateral, with active choroidal neovascularization: Secondary | ICD-10-CM

## 2022-07-20 DIAGNOSIS — S52514D Nondisplaced fracture of right radial styloid process, subsequent encounter for closed fracture with routine healing: Secondary | ICD-10-CM | POA: Diagnosis not present

## 2022-07-25 DIAGNOSIS — D631 Anemia in chronic kidney disease: Secondary | ICD-10-CM | POA: Diagnosis not present

## 2022-07-25 DIAGNOSIS — I1310 Hypertensive heart and chronic kidney disease without heart failure, with stage 1 through stage 4 chronic kidney disease, or unspecified chronic kidney disease: Secondary | ICD-10-CM | POA: Diagnosis not present

## 2022-07-25 DIAGNOSIS — S0240FA Zygomatic fracture, left side, initial encounter for closed fracture: Secondary | ICD-10-CM | POA: Diagnosis not present

## 2022-07-25 DIAGNOSIS — M1611 Unilateral primary osteoarthritis, right hip: Secondary | ICD-10-CM | POA: Diagnosis not present

## 2022-07-25 DIAGNOSIS — I3139 Other pericardial effusion (noninflammatory): Secondary | ICD-10-CM | POA: Diagnosis not present

## 2022-07-25 DIAGNOSIS — R42 Dizziness and giddiness: Secondary | ICD-10-CM | POA: Diagnosis not present

## 2022-07-25 DIAGNOSIS — S02842A Fracture of lateral orbital wall, left side, initial encounter for closed fracture: Secondary | ICD-10-CM | POA: Diagnosis not present

## 2022-07-25 DIAGNOSIS — S52514D Nondisplaced fracture of right radial styloid process, subsequent encounter for closed fracture with routine healing: Secondary | ICD-10-CM | POA: Diagnosis not present

## 2022-07-25 DIAGNOSIS — S0240DA Maxillary fracture, left side, initial encounter for closed fracture: Secondary | ICD-10-CM | POA: Diagnosis not present

## 2022-07-25 DIAGNOSIS — S0240FD Zygomatic fracture, left side, subsequent encounter for fracture with routine healing: Secondary | ICD-10-CM | POA: Diagnosis not present

## 2022-07-25 DIAGNOSIS — I2510 Atherosclerotic heart disease of native coronary artery without angina pectoris: Secondary | ICD-10-CM | POA: Diagnosis not present

## 2022-07-25 DIAGNOSIS — N1832 Chronic kidney disease, stage 3b: Secondary | ICD-10-CM | POA: Diagnosis not present

## 2022-07-25 DIAGNOSIS — S02842D Fracture of lateral orbital wall, left side, subsequent encounter for fracture with routine healing: Secondary | ICD-10-CM | POA: Diagnosis not present

## 2022-07-27 ENCOUNTER — Ambulatory Visit: Payer: Medicare PPO | Attending: Cardiology | Admitting: Cardiology

## 2022-07-27 VITALS — BP 112/60 | HR 104 | Ht 63.0 in | Wt 124.0 lb

## 2022-07-27 DIAGNOSIS — I951 Orthostatic hypotension: Secondary | ICD-10-CM | POA: Diagnosis not present

## 2022-07-27 DIAGNOSIS — E785 Hyperlipidemia, unspecified: Secondary | ICD-10-CM | POA: Diagnosis not present

## 2022-07-27 DIAGNOSIS — R002 Palpitations: Secondary | ICD-10-CM | POA: Diagnosis not present

## 2022-07-27 DIAGNOSIS — I10 Essential (primary) hypertension: Secondary | ICD-10-CM

## 2022-07-27 DIAGNOSIS — R600 Localized edema: Secondary | ICD-10-CM

## 2022-07-27 DIAGNOSIS — R55 Syncope and collapse: Secondary | ICD-10-CM

## 2022-07-27 MED ORDER — MIDODRINE HCL 2.5 MG PO TABS: 2.5000 mg | ORAL_TABLET | Freq: Every day | ORAL | 3 refills | Status: DC

## 2022-07-27 NOTE — Progress Notes (Signed)
Cardiology Office Note:  .   Date:  07/31/2022  ID:  [name], DOB 09/26/1927, MRN [id number] PCP: Thana Ates, MD  [facility] Providers Cardiologist:  Bryan Lemma, MD     Chief Complaint  Patient presents with   Shortness of Breath   Palpitations    Not overly symptomatic.  Monitor results.   Hospitalization Follow-up    History of Present Illness: .     [name] is a very pleasant 89 y.o. female with a PMH notable for HTN (labile), HLD, Moderate Aortic Stenosis, and Palpitations as well as DVT who presents here for 29-month and hospital follow-up at the request of Thana Ates, MD.  [name] was last seen on January 2024 follow-up, Coronary CTA and Zio patch monitor.  Also an Echocardiogram.-See below.   Subjective   INTERVAL HISTORY Admission to [facility] 6/5-08/2022: Presented with feeling faint and lightheaded.  Walking in her living room and lost consciousness, fell broke her arm.  Seen by cardiology and ENT.  Echo showed moderate AS.  Outpatient Zio patch ordered.  [name] returns today after her recent hospitalization stating that she is doing okay since then.  No further falls.  She does occasionally notice orthostatic symptoms-describing feeling lightheaded and dizzy with standing up.  She did note that prior to her going to the hospital, she had not been eating and drinking very well.  Not really noticing palpitations or irregular heartbeats.  She is noting more vertigo symptoms and orthostatic symptoms. She still has some shortness of breath on exertion, but not much change from baseline.  ROS:  Cardiovascular ROS: no chest pain or dyspnea on exertion positive for - dizziness, lightheadedness, vertigo, some orthostatic lightheadedness.  Skipped beats but no prolonged palpitations.  No further falls. negative for - edema, orthopnea, palpitations, paroxysmal nocturnal dyspnea, rapid heart rate, shortness of breath, or syncope or near  syncope, TIA or amaurosis fugax, claudication Review of Systems - Negative except the dizzy symptoms noted.  Now noting more memory loss and more anxiety issues as well.  Not eating and drinking as well.  More vertigo   Objective  Studies Reviewed: Marland Kitchen       ECHO 06/23/2022: EF 65 to 70%.  No RWMA.  GR 1 DD.  Normal RV.  Small pericardial effusion.  Mild MS.  Moderate MAC.  Moderate AS.  14-day Zio patch June 2024   Predominant Rhythm: Sinus-rate range 48-146 bpm.  Average 79 bpm.   Occasional (2.2%) PVCs with rare couplets, triplets, bigeminy and trigeminy.-Noted on symptoms log.   Rare PACs and PVCs with rare couplets and triplets.   2 short bursts of PVCs (ventricular tachycardia) 6 and 8 beats.  Fastest was 6 beats-max rate 122 bpm (range 110 to 122 bpm, average 118 bpm, 3.3 seconds), longest was 8 beats at an average rate of 105 bpm (range 89-110 bpm, average 105 bpm, 4.8 seconds).   21 Atrial Runs 6-11 beats: Fastest 6 beats with rate range 136-188 bpm, average 176 bpm, 2.2 seconds.  Longest 11 beats, rate range 77-133 bpm, average 113 bpm, 6.3 seconds.   No Sustained Arrhythmias: Atrial Tachycardia (AT), Supraventricular Tachycardia (SVT), Atrial Fibrillation (A-Fib), Atrial Flutter (A-Flutter), Sustained Ventricular Tachycardia (VT)   Symptoms noted with sinus rhythm and PVCs ---------- 4-DAY ZIO PATCH MONITOR RESULTS (1/29-12/13-2023)   Predominant underlying rhythm was Sinus Rhythm: : HR range 47 - 115 bpm, Avg 70 bpm; +/- AV Block (w/ or w/o  1  AVB)   Rare Isolated premature atrial contractions PACs (<1 %) noted, with rare couplets and triplets  Rare isolated premature ventricular contractions (PVCs) (<1 %) noted, with rare couplets and triplets; rare ventricular trigeminy; PVCs are monomorphic   55 Atrial Runs: Fastest 4 Beats w/ HR Range 169-176 bpm, Avg 172 bpm, 1.8 sec - with multiple spells in succession; Longest 28 Beats w/ HR Range 59-164 bpm, Avg 118 bpm, 15 sec   No Sustained  Arrhythmias: Atrial Tachycardia (AT), Supraventricular Tachycardia (SVT), Atrial Fibrillation (A-Fib), Atrial Flutter (A-Flutter), Sustained Ventricular Tachycardia (VT)   Patient Triggers: Sinus rhythm (+/- Tachycardia) with PVCs; Ventricular Trigeminy & Atrial Runs) were not triggered.   Cor CTA 12/13/2021:  1. Mild mixed non-obstructive CAD, CADRADS = 2.   2. Coronary calcium score of 255. This was 73rd percentile for age and sex matched control (based on an 89 yo).  3. Normal coronary origin with right dominance.   4. Aortic and mitral annular calcification.  5. Aortic atherosclerosis  Risk Assessment/Calculations:           Physical Exam:   VS:  BP 112/60   Pulse (!) 104   Ht 5\' 3"  (1.6 m)   Wt 124 lb (56.2 kg)   SpO2 96%   BMI 21.97 kg/m    Wt Readings from Last 3 Encounters:  07/27/22 124 lb (56.2 kg)  06/24/22 130 lb 11.7 oz (59.3 kg)  05/09/22 125 lb 6.4 oz (56.9 kg)    GEN: Well nourished, well developed in no acute distress; very pleasant.  Memory little bit less strong than before. NECK: No JVD; No carotid bruits CARDIAC: Normal S1, S2; RRR, no murmurs, rubs, gallops RESPIRATORY:  Clear to auscultation without rales, wheezing or rhonchi ; nonlabored, good air movement. ABDOMEN: Soft, non-tender, non-distended EXTREMITIES:  No edema; No deformity     ASSESSMENT AND PLAN: .    Problem List Items Addressed This Visit       Cardiology Problems   Essential hypertension (Chronic)    Somewhat labile blood pressures.  She is concerned about high blood pressures, I am more concerned about low blood pressures.  She is currently taking Toprol which I recommended that she takes at in the evening after her evening meal.  Hold if systolic pressures less than 110 mmHg.  Tolerate blood pressures range as long as his systolic pressures are <180 mmHg.  Concerned about morning hypotension -> will start midodrine 2.5 mg every morning to avoid orthostasis in the morning. Ensure  adequate hydration.        Relevant Medications   midodrine (PROAMATINE) 2.5 MG tablet   Dyslipidemia, goal LDL below 100 (Chronic)    No longer on pravastatin. At this point, given her advanced age, would be reluctant to be more aggressive treating.  Agree with holding statin going forward.      Relevant Medications   midodrine (PROAMATINE) 2.5 MG tablet   Dysautonomia orthostatic hypotension syndrome - Primary (Chronic)    Concerning symptoms that led to her passing out last night.  Not eating and drinking well.  She needs to make sure that she is drinking enough and eating well enough as well as hydrating.  Patient instructions: Take Toprol 25 mg after dinner time Start midodrine 2.5 mg p.o. every morning. Would not be concerned to blood pressure as SBPs remain < 180 mmHg. Hydrate,hydrate   drink at least 70 oz of fluid /water daily  Try to use  any electrolyte drinks -low  in sugar  2 to 3 times a day  Elevate legs and feet  20 - 30 min  3 to 4 times a day  Recommend support stockings x 20-30 mmHg (or at least 15-20 mmHg)      Relevant Medications   midodrine (PROAMATINE) 2.5 MG tablet     Other   Syncope    Likely related to dysautonomia and vasovagal symptoms but also having vertigo as well.  She had not been eating and drinking well.  See dysautonomia section above.      Palpitations (Chronic)    Still notes palpitations and is having short little bursts of PAT and nonsustained VT but short lived.  Interestingly, symptoms were noted with PVCs and not with the short runs.  Would not be overly aggressive and increased Toprol anymore than we already have.  Continue 25 mg every evening with dinner.           Dispo: Return in about 4 months (around 11/27/2022) for 4 months with APP, 8 months MD.  Total time spent: 43 min spent with patient + 14 min spent charting = 57 min     Signed, Marykay Lex, MD, MS Bryan Lemma, M.D., M.S. Interventional  Cardiologist  [facility]  Pager # [phone] Phone # [phone] [address] [place], [place] [address]

## 2022-07-27 NOTE — Patient Instructions (Addendum)
Medication Instructions:    Take Metoprolol  succinate 25 mg  after dinner  meal     Take Midodrine 2.5 mg take after breakfast daily    Not concern with blood pressure unless it continually stay above 180 systolic  ( top number)    *If you need a refill on your cardiac medications before your next appointment, please call your pharmacy*   Lab Work:  Not needed   Testing/Procedures:  Not needed  Follow-Up: At [facility], you and your health needs are our priority.  As part of our continuing mission to provide you with exceptional heart care, we have created designated Provider Care Teams.  These Care Teams include your primary Cardiologist (physician) and Advanced Practice Providers (APPs -  Physician Assistants and Nurse Practitioners) who all work together to provide you with the care you need, when you need it.     Your next appointment:   3 to 4 month(s)  The format for your next appointment:   In Person  Provider:   Joni Reining, DNP, ANP or Bernadene Person, NP       Other Instructions    Hydrate,hydrate   drink at least 70 oz of fluid /water daily   Try to use  any electrolyte drinks -low in sugar  2 to 3 times a day   Elevate legs and feet  20 - 30 min  3 to 4 times a day   Wear support  compression  socks/stocking  recommends you purchase some compression  socks/hose from [employer] in [place] ,[place]. You do not need an prescription to purchase the items.  Address  [address] [place], [place] [address]  Phone  [phone]   Compression   strength    8-15 mmHg X 15-20 mmHg or                         X  20-30 mmHg  30-40 mmHg.  You may also try a medical supply store, department store (i.e.- [employer], [employer], [employer], specialty shoe stores ( shoe market),  or  local pharmacy medical uniform store.

## 2022-07-28 DIAGNOSIS — I1310 Hypertensive heart and chronic kidney disease without heart failure, with stage 1 through stage 4 chronic kidney disease, or unspecified chronic kidney disease: Secondary | ICD-10-CM | POA: Diagnosis not present

## 2022-07-28 DIAGNOSIS — S02842D Fracture of lateral orbital wall, left side, subsequent encounter for fracture with routine healing: Secondary | ICD-10-CM | POA: Diagnosis not present

## 2022-07-28 DIAGNOSIS — I3139 Other pericardial effusion (noninflammatory): Secondary | ICD-10-CM | POA: Diagnosis not present

## 2022-07-28 DIAGNOSIS — N1832 Chronic kidney disease, stage 3b: Secondary | ICD-10-CM | POA: Diagnosis not present

## 2022-07-28 DIAGNOSIS — M1611 Unilateral primary osteoarthritis, right hip: Secondary | ICD-10-CM | POA: Diagnosis not present

## 2022-07-28 DIAGNOSIS — I2510 Atherosclerotic heart disease of native coronary artery without angina pectoris: Secondary | ICD-10-CM | POA: Diagnosis not present

## 2022-07-28 DIAGNOSIS — D631 Anemia in chronic kidney disease: Secondary | ICD-10-CM | POA: Diagnosis not present

## 2022-07-28 DIAGNOSIS — S52514D Nondisplaced fracture of right radial styloid process, subsequent encounter for closed fracture with routine healing: Secondary | ICD-10-CM | POA: Diagnosis not present

## 2022-07-28 DIAGNOSIS — S0240FD Zygomatic fracture, left side, subsequent encounter for fracture with routine healing: Secondary | ICD-10-CM | POA: Diagnosis not present

## 2022-07-31 ENCOUNTER — Encounter: Payer: Self-pay | Admitting: Cardiology

## 2022-07-31 NOTE — Assessment & Plan Note (Signed)
Still notes palpitations and is having short little bursts of PAT and nonsustained VT but short lived.  Interestingly, symptoms were noted with PVCs and not with the short runs.  Would not be overly aggressive and increased Toprol anymore than we already have.  Continue 25 mg every evening with dinner.

## 2022-07-31 NOTE — Assessment & Plan Note (Signed)
Concerning symptoms that led to her passing out last night.  Not eating and drinking well.  She needs to make sure that she is drinking enough and eating well enough as well as hydrating.  Patient instructions: Take Toprol 25 mg after dinner time Start midodrine 2.5 mg p.o. every morning. Would not be concerned to blood pressure as SBPs remain < 180 mmHg. Hydrate,hydrate   drink at least 70 oz of fluid /water daily  Try to use  any electrolyte drinks -low in sugar  2 to 3 times a day  Elevate legs and feet  20 - 30 min  3 to 4 times a day  Recommend support stockings x 20-30 mmHg (or at least 15-20 mmHg)

## 2022-07-31 NOTE — Assessment & Plan Note (Signed)
No longer on pravastatin. At this point, given her advanced age, would be reluctant to be more aggressive treating.  Agree with holding statin going forward.

## 2022-07-31 NOTE — Assessment & Plan Note (Signed)
Likely related to dysautonomia and vasovagal symptoms but also having vertigo as well.  She had not been eating and drinking well.  See dysautonomia section above.

## 2022-07-31 NOTE — Assessment & Plan Note (Signed)
Somewhat labile blood pressures.  She is concerned about high blood pressures, I am more concerned about low blood pressures.  She is currently taking Toprol which I recommended that she takes at in the evening after her evening meal.  Hold if systolic pressures less than 110 mmHg.  Tolerate blood pressures range as long as his systolic pressures are <180 mmHg.  Concerned about morning hypotension -> will start midodrine 2.5 mg every morning to avoid orthostasis in the morning. Ensure adequate hydration.

## 2022-08-02 ENCOUNTER — Telehealth: Payer: Self-pay | Admitting: Cardiology

## 2022-08-02 DIAGNOSIS — M1611 Unilateral primary osteoarthritis, right hip: Secondary | ICD-10-CM | POA: Diagnosis not present

## 2022-08-02 DIAGNOSIS — S52514D Nondisplaced fracture of right radial styloid process, subsequent encounter for closed fracture with routine healing: Secondary | ICD-10-CM | POA: Diagnosis not present

## 2022-08-02 DIAGNOSIS — D631 Anemia in chronic kidney disease: Secondary | ICD-10-CM | POA: Diagnosis not present

## 2022-08-02 DIAGNOSIS — N1832 Chronic kidney disease, stage 3b: Secondary | ICD-10-CM | POA: Diagnosis not present

## 2022-08-02 DIAGNOSIS — S0240FD Zygomatic fracture, left side, subsequent encounter for fracture with routine healing: Secondary | ICD-10-CM | POA: Diagnosis not present

## 2022-08-02 DIAGNOSIS — I3139 Other pericardial effusion (noninflammatory): Secondary | ICD-10-CM | POA: Diagnosis not present

## 2022-08-02 DIAGNOSIS — S02842D Fracture of lateral orbital wall, left side, subsequent encounter for fracture with routine healing: Secondary | ICD-10-CM | POA: Diagnosis not present

## 2022-08-02 DIAGNOSIS — I2510 Atherosclerotic heart disease of native coronary artery without angina pectoris: Secondary | ICD-10-CM | POA: Diagnosis not present

## 2022-08-02 DIAGNOSIS — I1310 Hypertensive heart and chronic kidney disease without heart failure, with stage 1 through stage 4 chronic kidney disease, or unspecified chronic kidney disease: Secondary | ICD-10-CM | POA: Diagnosis not present

## 2022-08-02 NOTE — Telephone Encounter (Signed)
 Patient is returning call to discuss monitor results.

## 2022-08-02 NOTE — Telephone Encounter (Signed)
Patient aware of monitor results and verbalized understanding.

## 2022-08-03 DIAGNOSIS — I5030 Unspecified diastolic (congestive) heart failure: Secondary | ICD-10-CM | POA: Diagnosis not present

## 2022-08-03 DIAGNOSIS — S0240FD Zygomatic fracture, left side, subsequent encounter for fracture with routine healing: Secondary | ICD-10-CM | POA: Diagnosis not present

## 2022-08-03 DIAGNOSIS — R42 Dizziness and giddiness: Secondary | ICD-10-CM | POA: Diagnosis not present

## 2022-08-03 DIAGNOSIS — S0285XD Fracture of orbit, unspecified, subsequent encounter for fracture with routine healing: Secondary | ICD-10-CM | POA: Diagnosis not present

## 2022-08-03 DIAGNOSIS — S52501D Unspecified fracture of the lower end of right radius, subsequent encounter for closed fracture with routine healing: Secondary | ICD-10-CM | POA: Diagnosis not present

## 2022-08-03 DIAGNOSIS — N1832 Chronic kidney disease, stage 3b: Secondary | ICD-10-CM | POA: Diagnosis not present

## 2022-08-03 DIAGNOSIS — R55 Syncope and collapse: Secondary | ICD-10-CM | POA: Diagnosis not present

## 2022-08-04 DIAGNOSIS — S02842D Fracture of lateral orbital wall, left side, subsequent encounter for fracture with routine healing: Secondary | ICD-10-CM | POA: Diagnosis not present

## 2022-08-04 DIAGNOSIS — I1310 Hypertensive heart and chronic kidney disease without heart failure, with stage 1 through stage 4 chronic kidney disease, or unspecified chronic kidney disease: Secondary | ICD-10-CM | POA: Diagnosis not present

## 2022-08-04 DIAGNOSIS — I3139 Other pericardial effusion (noninflammatory): Secondary | ICD-10-CM | POA: Diagnosis not present

## 2022-08-04 DIAGNOSIS — I2510 Atherosclerotic heart disease of native coronary artery without angina pectoris: Secondary | ICD-10-CM | POA: Diagnosis not present

## 2022-08-04 DIAGNOSIS — S0240FD Zygomatic fracture, left side, subsequent encounter for fracture with routine healing: Secondary | ICD-10-CM | POA: Diagnosis not present

## 2022-08-04 DIAGNOSIS — D631 Anemia in chronic kidney disease: Secondary | ICD-10-CM | POA: Diagnosis not present

## 2022-08-04 DIAGNOSIS — M1611 Unilateral primary osteoarthritis, right hip: Secondary | ICD-10-CM | POA: Diagnosis not present

## 2022-08-04 DIAGNOSIS — N1832 Chronic kidney disease, stage 3b: Secondary | ICD-10-CM | POA: Diagnosis not present

## 2022-08-04 DIAGNOSIS — S52514D Nondisplaced fracture of right radial styloid process, subsequent encounter for closed fracture with routine healing: Secondary | ICD-10-CM | POA: Diagnosis not present

## 2022-08-12 DIAGNOSIS — I509 Heart failure, unspecified: Secondary | ICD-10-CM | POA: Diagnosis not present

## 2022-08-12 DIAGNOSIS — N3941 Urge incontinence: Secondary | ICD-10-CM | POA: Diagnosis not present

## 2022-08-12 DIAGNOSIS — M545 Low back pain, unspecified: Secondary | ICD-10-CM | POA: Diagnosis not present

## 2022-08-12 DIAGNOSIS — J449 Chronic obstructive pulmonary disease, unspecified: Secondary | ICD-10-CM | POA: Diagnosis not present

## 2022-08-12 DIAGNOSIS — M1990 Unspecified osteoarthritis, unspecified site: Secondary | ICD-10-CM | POA: Diagnosis not present

## 2022-08-12 DIAGNOSIS — I951 Orthostatic hypotension: Secondary | ICD-10-CM | POA: Diagnosis not present

## 2022-08-12 DIAGNOSIS — N1832 Chronic kidney disease, stage 3b: Secondary | ICD-10-CM | POA: Diagnosis not present

## 2022-08-12 DIAGNOSIS — K589 Irritable bowel syndrome without diarrhea: Secondary | ICD-10-CM | POA: Diagnosis not present

## 2022-08-12 DIAGNOSIS — M810 Age-related osteoporosis without current pathological fracture: Secondary | ICD-10-CM | POA: Diagnosis not present

## 2022-08-23 ENCOUNTER — Encounter (INDEPENDENT_AMBULATORY_CARE_PROVIDER_SITE_OTHER): Payer: Medicare PPO | Admitting: Ophthalmology

## 2022-08-23 DIAGNOSIS — H353231 Exudative age-related macular degeneration, bilateral, with active choroidal neovascularization: Secondary | ICD-10-CM

## 2022-08-23 DIAGNOSIS — I10 Essential (primary) hypertension: Secondary | ICD-10-CM

## 2022-08-23 DIAGNOSIS — H35033 Hypertensive retinopathy, bilateral: Secondary | ICD-10-CM

## 2022-08-23 DIAGNOSIS — H43813 Vitreous degeneration, bilateral: Secondary | ICD-10-CM

## 2022-08-24 DIAGNOSIS — S52514D Nondisplaced fracture of right radial styloid process, subsequent encounter for closed fracture with routine healing: Secondary | ICD-10-CM | POA: Diagnosis not present

## 2022-09-14 DIAGNOSIS — S52514D Nondisplaced fracture of right radial styloid process, subsequent encounter for closed fracture with routine healing: Secondary | ICD-10-CM | POA: Diagnosis not present

## 2022-09-21 DIAGNOSIS — Z87440 Personal history of urinary (tract) infections: Secondary | ICD-10-CM | POA: Diagnosis not present

## 2022-09-21 DIAGNOSIS — N3941 Urge incontinence: Secondary | ICD-10-CM | POA: Diagnosis not present

## 2022-09-21 DIAGNOSIS — I129 Hypertensive chronic kidney disease with stage 1 through stage 4 chronic kidney disease, or unspecified chronic kidney disease: Secondary | ICD-10-CM | POA: Diagnosis not present

## 2022-09-21 DIAGNOSIS — R350 Frequency of micturition: Secondary | ICD-10-CM | POA: Diagnosis not present

## 2022-09-21 DIAGNOSIS — N1831 Chronic kidney disease, stage 3a: Secondary | ICD-10-CM | POA: Diagnosis not present

## 2022-09-27 ENCOUNTER — Encounter (INDEPENDENT_AMBULATORY_CARE_PROVIDER_SITE_OTHER): Payer: Medicare PPO | Admitting: Ophthalmology

## 2022-09-27 DIAGNOSIS — H353231 Exudative age-related macular degeneration, bilateral, with active choroidal neovascularization: Secondary | ICD-10-CM

## 2022-09-27 DIAGNOSIS — H43813 Vitreous degeneration, bilateral: Secondary | ICD-10-CM

## 2022-09-27 DIAGNOSIS — H35033 Hypertensive retinopathy, bilateral: Secondary | ICD-10-CM | POA: Diagnosis not present

## 2022-09-27 DIAGNOSIS — I10 Essential (primary) hypertension: Secondary | ICD-10-CM

## 2022-09-28 ENCOUNTER — Ambulatory Visit: Payer: Medicare PPO | Attending: Internal Medicine

## 2022-09-28 ENCOUNTER — Telehealth: Payer: Self-pay

## 2022-09-28 DIAGNOSIS — M6281 Muscle weakness (generalized): Secondary | ICD-10-CM | POA: Insufficient documentation

## 2022-09-28 DIAGNOSIS — R42 Dizziness and giddiness: Secondary | ICD-10-CM | POA: Diagnosis not present

## 2022-09-28 DIAGNOSIS — R2689 Other abnormalities of gait and mobility: Secondary | ICD-10-CM | POA: Diagnosis not present

## 2022-09-28 DIAGNOSIS — R2681 Unsteadiness on feet: Secondary | ICD-10-CM | POA: Insufficient documentation

## 2022-09-28 NOTE — Therapy (Signed)
OUTPATIENT PHYSICAL THERAPY VESTIBULAR EVALUATION     Patient Name: [name] MRN: [id number] DOB:07/08/1927, 89 y.o., female Today's Date: 09/28/2022  END OF SESSION:  PT End of Session - 09/28/22 1450     Visit Number 1    Number of Visits 5    Date for PT Re-Evaluation 10/28/22    Authorization Type humana medicare    PT Start Time 1448    PT Stop Time 1528    PT Time Calculation (min) 40 min    Activity Tolerance Patient tolerated treatment well    Behavior During Therapy [facility] for tasks assessed/performed             Past Medical History:  Diagnosis Date   Abnormal Pap smear 1975-76   Acute deep vein thrombosis (DVT) of left peroneal vein (HCC)    Anemia    history of   Arthritis    spine   Breast cyst, left 1980   Cystocele 2012   Diverticulosis    with h/o Diverticulitis   DVT, lower extremity, distal, chronic (HCC) 12/24/2018   GERD (gastroesophageal reflux disease)    H/O hypercholesterolemia    H/O osteopenia    H/O varicella    H/O varicose veins    Hx of Mumps    Macular degeneration    Seasonal allergies    TIA (transient ischemic attack)    Urge incontinence 2012   Urinary frequency 2010   Past Surgical History:  Procedure Laterality Date   14-Day Zio Patch  12/2021   Predominantly sinus rhythm open and 47-1:15 PM, average 70 bpm (1  AVB); rare isolated PACs PVCs.  55 atrial runs 4-28 beats fastest heart rate ranges 169 to 176 bpm.  Episodes lasting 1.8 to 15 seconds.  No sustained arrhythmias.  Symptoms not noted with atrial runs.   ABDOMINAL HYSTERECTOMY     BLADDER SUSPENSION N/A 06/16/2016   Procedure: TRANSVAGINAL TAPE (TVT) PROCEDURE;  Surgeon: Osborn Coho, MD;  Location: [facility];  Service: Gynecology;  Laterality: N/A;   BREAST CYST ASPIRATION  1964   COLONOSCOPY     Dr. Kinnie Scales   CT CTA CORONARY W/CA SCORE W/CM &/OR WO/CM  11/2021   1. Mild mixed non-obstructive CAD, CADRADS = 2.   2. Coronary calcium score of 255. This was  73rd percentile for age and sex matched control (based on an 89 yo).  3. Normal coronary origin with right dominance.   4. Aortic and mitral annular calcification.  5. Aortic atherosclerosis   CYSTOCELE REPAIR N/A 06/16/2016   Procedure: ANTERIOR REPAIR (CYSTOCELE);  Surgeon: Osborn Coho, MD;  Location: [facility];  Service: Gynecology;  Laterality: N/A;   CYSTOSCOPY N/A 06/16/2016   Procedure: CYSTOSCOPY;  Surgeon: Osborn Coho, MD;  Location: [facility];  Service: Gynecology;  Laterality: N/A;  see anterior repair   Lower Extremity Venous Dopplers  01/09/2012   Right and left lower steroids: No evidence of thrombus or, thrombophlebitis; right and left GSV and SSV: No venous insufficiency. Normal exam.   NM MYOVIEW LTD  11/2016   6.6 METS.  Reached 106% of max.  Heart rate.  Walk for 4:40 min.  EF 72%.  Normal blood pressure response.  upsloping ST segment depression, nonspecific.  Otherwise normal study.  LOW RISK.  No evidence of ischemia or infarction.   TRANSTHORACIC ECHOCARDIOGRAM  11/2021   Normal LV Size & Fxn - EF 60-65%. No RWMA. ~ Diastolic Fxn. Degenerative MV - Mod MAC. ~ Mod AS  Patient Active Problem List   Diagnosis Date Noted   Dysautonomia orthostatic hypotension syndrome 07/27/2022   Syncope 06/22/2022   Fracture of other specified skull and facial bones, left side, initial encounter for closed fracture (HCC) 06/22/2022   Distal radial fracture 06/22/2022   Chronic kidney disease, stage 3b (HCC) 06/22/2022   Elevated troponin 06/22/2022   Acute urinary retention 06/22/2022   Dizziness, nonspecific 11/22/2021   Esophageal dysmotility 10/23/2021   Precordial pain 10/19/2021   Dyslipidemia 10/19/2021   History of TIA (transient ischemic attack) 10/19/2021   Vertigo 10/19/2021   Abnormal weight loss 10/19/2021   History of DVT of lower extremity 04/04/2021   Chronic kidney disease, stage 3a (HCC) 04/04/2021   AKI (acute kidney injury) (HCC) 04/04/2021   Unintentional  weight loss 04/04/2021   Dyslipidemia, goal LDL below 100 04/12/2020   Palpitations 05/30/2018   Irregular heartbeat 03/02/2018   TIA (transient ischemic attack) 02/15/2018   Urinary, incontinence, stress female 06/16/2016   Right carotid bruit 12/23/2014   DOE (dyspnea on exertion) 12/24/2013   Bilateral arm numbness and tingling while sleeping - and shortly after waking 12/24/2013   Poor balance 12/24/2013   Edema of left lower extremity 12/21/2012   Essential hypertension    H/O hypercholesterolemia    Osteoporosis - of the spine 09/22/2011    PCP:   Thana Ates, MD   REFERRING PROVIDER:   Thana Ates, MD    REFERRING DIAG: R42 (ICD-10-CM) - Dizziness and giddiness   THERAPY DIAG:  Dizziness and giddiness - Plan: PT plan of care cert/re-cert  Unsteadiness on feet - Plan: PT plan of care cert/re-cert  ONSET DATE: 06/22/22 fall  Rationale for Evaluation and Treatment: Rehabilitation  SUBJECTIVE:   SUBJECTIVE STATEMENT: Patient arrives to clinic alone, no AD. Had a fall June 5th where she "blacked out," fell down. Per patient, cardiologist said her BP bottomed out. Had [facility] therapy. Reports remaining issues with R wrist related to fx. Endorses dizziness with exertion and laying down -> standing up.  Pt accompanied by: self  PERTINENT HISTORY: TIA, Stage 3 CKD, urinary frequency and incontinence, macular degeneration, osteopenia, hypercholesterolemia, GERD, diverticulosis, IBS, stable angina pectoris, cystocele with repair, OA, anemia   PAIN:  Are you having pain? No  PRECAUTIONS: Fall   WEIGHT BEARING RESTRICTIONS: No  FALLS: Has patient fallen in last 6 months? Yes. Number of falls 1; "blacked out" and broke R wrist, L zygomatic arch  LIVING ENVIRONMENT: Lives with: lives with their spouse Lives in: House/apartment Stairs: No Has following equipment at home: Environmental consultant - 4 wheeled and Grab bars  PLOF: Independent  PATIENT GOALS: "to get my wrist well and not  get dizzy"  OBJECTIVE:   DIAGNOSTIC FINDINGS:  06/22/22 Head CT IMPRESSION: 1. No acute intracranial abnormalities. Chronic atrophy and small vessel ischemic changes. 2. Left facial fractures with air-fluid level in the left maxillary antrum. See additional report of CT maxillofacial same date.  COGNITION: Overall cognitive status: Within functional limits for tasks assessed   SENSATION: WFL   POSTURE:  rounded shoulders, forward head, increased thoracic kyphosis, posterior pelvic tilt, and flexed trunk   Cervical ROM:   Generally limited, painfree  STRENGTH: WFL  BED MOBILITY:  Gets dizzy when first laying down  PATIENT SURVEYS:  FOTO did not capture  VESTIBULAR ASSESSMENT:  GENERAL OBSERVATION: NAD   SYMPTOM BEHAVIOR:  Subjective history: see above  Non-Vestibular symptoms: loss of consciousness  Type of dizziness: Lightheadedness/Faint, "Funny feeling in the head",  and "Swimmyheaded"  Frequency: when she bends down  Duration: seconds  Aggravating factors: Induced by motion: bending down to the ground and activity in general  Relieving factors: rest and slow movements  Progression of symptoms: better  OCULOMOTOR EXAM:  Ocular Alignment: normal  Ocular ROM: No Limitations  Spontaneous Nystagmus: absent  Gaze-Induced Nystagmus: absent  Smooth Pursuits: intact  Saccades: intact   VESTIBULAR - OCULAR REFLEX:   Slow VOR: Normal  VOR Cancellation: Normal  Head-Impulse Test: HIT Right: negative HIT Left: positive    POSITIONAL TESTING: Right Roll Test: no nystagmus Left Roll Test: no nystagmus Right Sidelying: no nystagmus Left Sidelying: no nystagmus  MOTION SENSITIVITY:  Motion Sensitivity Quotient Intensity: 0 = none, 1 = Lightheaded, 2 = Mild, 3 = Moderate, 4 = Severe, 5 = Vomiting  Intensity  1. Sitting to supine 1  2. Supine to L side 2  3. Supine to R side 0  4. Supine to sitting 2  5. L Hallpike-Dix   6. Up from L    7. R Hallpike-Dix    8. Up from R    9. Sitting, head tipped to L knee 1  10. Head up from L knee 1  11. Sitting, head tipped to R knee 1  12. Head up from R knee 1  13. Sitting head turns x5 1  14.Sitting head nods x5 1  15. In stance, 180 turn to L    16. In stance, 180 turn to R     OTHOSTATICS: Supine: 130/64 with HR 76 Standing: 145/81 with HR 99  VESTIBULAR TREATMENT:                                                                                                   N/a eval  PATIENT EDUCATION: Education details: PT POC, exam findings Person educated: Patient Education method: Explanation Education comprehension: verbalized understanding and needs further education  HOME EXERCISE PROGRAM: To be provided GOALS: Goals reviewed with patient? Yes  SHORT TERM GOALS: = LTG based on PT POC  LONG TERM GOALS: Target date: 10/28/22  Pt will be independent with final HEP for improved symptom report  Baseline: to be provided Goal status: INITIAL  2.  FGA goal Baseline: to be assessed Goal status: INITIAL  3.  FOTO goal Baseline: to be assessed Goal status: INITIAL   ASSESSMENT:  CLINICAL IMPRESSION: Patient is a 89 y.o. female who was seen today for physical therapy evaluation and treatment for dizziness. She does have underlying cardiogenic factors contributing to her experience of dizziness, though she is not orthostatic in the clinic today. Normal vestibular exam, except for L positive HIT, which was the case last she was seen in this clinic for vertigo. She would benefit from skilled PT services to address the above mentioned deficits.     OBJECTIVE IMPAIRMENTS: Abnormal gait, cardiopulmonary status limiting activity, decreased balance, decreased endurance, decreased knowledge of condition, and dizziness.   ACTIVITY LIMITATIONS: carrying, lifting, bending, squatting, bed mobility, hygiene/grooming, locomotion level, and caring for others  PARTICIPATION LIMITATIONS: meal prep,  cleaning, interpersonal relationship, driving, shopping,  and community activity  PERSONAL FACTORS: Age, Fitness, Past/current experiences, Sex, Time since onset of injury/illness/exacerbation, Transportation, and 3+ comorbidities: see above  are also affecting patient's functional outcome.   REHAB POTENTIAL: Fair time since onset  CLINICAL DECISION MAKING: Stable/uncomplicated  EVALUATION COMPLEXITY: Low   PLAN:  PT FREQUENCY: 1x/week  PT DURATION: 4 weeks  PLANNED INTERVENTIONS: Therapeutic exercises, Therapeutic activity, Neuromuscular re-education, Balance training, Gait training, Patient/Family education, Self Care, Joint mobilization, Stair training, Vestibular training, Canalith repositioning, Visual/preceptual remediation/compensation, DME instructions, Aquatic Therapy, Manual therapy, and Re-evaluation  PLAN FOR NEXT SESSION: FGA, HEP for VOR/habituation   Westley Foots, PT Westley Foots, PT, DPT, CBIS  09/28/2022, 3:44 PM

## 2022-09-28 NOTE — Telephone Encounter (Signed)
Dr. Carola Frost, [name] was evaluated by PT on 09/28/22 for dizziness.  The patient would benefit from an OT evaluation for s/p R wrist fx.     If you agree, please place an order in [facility] workque in [facility] or fax the order to [phone]. Thank you, Westley Foots, PT, DPT, [facility] [address] [place], [place]  [address] Phone:  [phone] Fax:  [phone]

## 2022-10-05 ENCOUNTER — Ambulatory Visit: Payer: Medicare PPO

## 2022-10-05 DIAGNOSIS — R2689 Other abnormalities of gait and mobility: Secondary | ICD-10-CM | POA: Diagnosis not present

## 2022-10-05 DIAGNOSIS — R2681 Unsteadiness on feet: Secondary | ICD-10-CM | POA: Diagnosis not present

## 2022-10-05 DIAGNOSIS — M6281 Muscle weakness (generalized): Secondary | ICD-10-CM

## 2022-10-05 DIAGNOSIS — R42 Dizziness and giddiness: Secondary | ICD-10-CM | POA: Diagnosis not present

## 2022-10-05 NOTE — Therapy (Signed)
OUTPATIENT PHYSICAL THERAPY VESTIBULAR TREATMENT     Patient Name: [name] MRN: [id number] DOB:December 29, 1927, 89 y.o., female Today's Date: 10/05/2022  END OF SESSION:  PT End of Session - 10/05/22 1404     Visit Number 2    Number of Visits 5    Date for PT Re-Evaluation 10/28/22    Authorization Type humana medicare    PT Start Time 1400    PT Stop Time 1440    PT Time Calculation (min) 40 min    Equipment Utilized During Treatment Gait belt    Activity Tolerance Patient tolerated treatment well    Behavior During Therapy WFL for tasks assessed/performed             Past Medical History:  Diagnosis Date   Abnormal Pap smear 1975-76   Acute deep vein thrombosis (DVT) of left peroneal vein (HCC)    Anemia    history of   Arthritis    spine   Breast cyst, left 1980   Cystocele 2012   Diverticulosis    with h/o Diverticulitis   DVT, lower extremity, distal, chronic (HCC) 12/24/2018   GERD (gastroesophageal reflux disease)    H/O hypercholesterolemia    H/O osteopenia    H/O varicella    H/O varicose veins    Hx of Mumps    Macular degeneration    Seasonal allergies    TIA (transient ischemic attack)    Urge incontinence 2012   Urinary frequency 2010   Past Surgical History:  Procedure Laterality Date   14-Day Zio Patch  12/2021   Predominantly sinus rhythm open and 47-1:15 PM, average 70 bpm (1  AVB); rare isolated PACs PVCs.  55 atrial runs 4-28 beats fastest heart rate ranges 169 to 176 bpm.  Episodes lasting 1.8 to 15 seconds.  No sustained arrhythmias.  Symptoms not noted with atrial runs.   ABDOMINAL HYSTERECTOMY     BLADDER SUSPENSION N/A 06/16/2016   Procedure: TRANSVAGINAL TAPE (TVT) PROCEDURE;  Surgeon: Osborn Coho, MD;  Location: [facility];  Service: Gynecology;  Laterality: N/A;   BREAST CYST ASPIRATION  1964   COLONOSCOPY     Dr. Kinnie Scales   CT CTA CORONARY W/CA SCORE W/CM &/OR WO/CM  11/2021   1. Mild mixed non-obstructive CAD, CADRADS =  2.   2. Coronary calcium score of 255. This was 73rd percentile for age and sex matched control (based on an 89 yo).  3. Normal coronary origin with right dominance.   4. Aortic and mitral annular calcification.  5. Aortic atherosclerosis   CYSTOCELE REPAIR N/A 06/16/2016   Procedure: ANTERIOR REPAIR (CYSTOCELE);  Surgeon: Osborn Coho, MD;  Location: [facility];  Service: Gynecology;  Laterality: N/A;   CYSTOSCOPY N/A 06/16/2016   Procedure: CYSTOSCOPY;  Surgeon: Osborn Coho, MD;  Location: [facility];  Service: Gynecology;  Laterality: N/A;  see anterior repair   Lower Extremity Venous Dopplers  01/09/2012   Right and left lower steroids: No evidence of thrombus or, thrombophlebitis; right and left GSV and SSV: No venous insufficiency. Normal exam.   NM MYOVIEW LTD  11/2016   6.6 METS.  Reached 106% of max.  Heart rate.  Walk for 4:40 min.  EF 72%.  Normal blood pressure response.  upsloping ST segment depression, nonspecific.  Otherwise normal study.  LOW RISK.  No evidence of ischemia or infarction.   TRANSTHORACIC ECHOCARDIOGRAM  11/2021   Normal LV Size & Fxn - EF 60-65%. No RWMA. ~ Diastolic Fxn. Degenerative  MV - Mod MAC. ~ Mod AS   Patient Active Problem List   Diagnosis Date Noted   Dysautonomia orthostatic hypotension syndrome 07/27/2022   Syncope 06/22/2022   Fracture of other specified skull and facial bones, left side, initial encounter for closed fracture (HCC) 06/22/2022   Distal radial fracture 06/22/2022   Chronic kidney disease, stage 3b (HCC) 06/22/2022   Elevated troponin 06/22/2022   Acute urinary retention 06/22/2022   Dizziness, nonspecific 11/22/2021   Esophageal dysmotility 10/23/2021   Precordial pain 10/19/2021   Dyslipidemia 10/19/2021   History of TIA (transient ischemic attack) 10/19/2021   Vertigo 10/19/2021   Abnormal weight loss 10/19/2021   History of DVT of lower extremity 04/04/2021   Chronic kidney disease, stage 3a (HCC) 04/04/2021   AKI (acute  kidney injury) (HCC) 04/04/2021   Unintentional weight loss 04/04/2021   Dyslipidemia, goal LDL below 100 04/12/2020   Palpitations 05/30/2018   Irregular heartbeat 03/02/2018   TIA (transient ischemic attack) 02/15/2018   Urinary, incontinence, stress female 06/16/2016   Right carotid bruit 12/23/2014   DOE (dyspnea on exertion) 12/24/2013   Bilateral arm numbness and tingling while sleeping - and shortly after waking 12/24/2013   Poor balance 12/24/2013   Edema of left lower extremity 12/21/2012   Essential hypertension    H/O hypercholesterolemia    Osteoporosis - of the spine 09/22/2011    PCP:   Thana Ates, MD   REFERRING PROVIDER:   Thana Ates, MD    REFERRING DIAG: R42 (ICD-10-CM) - Dizziness and giddiness   THERAPY DIAG:  Dizziness and giddiness  Unsteadiness on feet  Muscle weakness (generalized)  Other abnormalities of gait and mobility  ONSET DATE: 06/22/22 fall  Rationale for Evaluation and Treatment: Rehabilitation  SUBJECTIVE:   SUBJECTIVE STATEMENT: Patient arrives to clinic alone. Denies falls. Rates current dizziness as a 1-2/5.  Pt accompanied by: self  PERTINENT HISTORY: TIA, Stage 3 CKD, urinary frequency and incontinence, macular degeneration, osteopenia, hypercholesterolemia, GERD, diverticulosis, IBS, stable angina pectoris, cystocele with repair, OA, anemia   PAIN:  Are you having pain? No  PRECAUTIONS: Fall    PATIENT GOALS: "to get my wrist well and not get dizzy"  OBJECTIVE:   DIAGNOSTIC FINDINGS:  06/22/22 Head CT IMPRESSION: 1. No acute intracranial abnormalities. Chronic atrophy and small vessel ischemic changes. 2. Left facial fractures with air-fluid level in the left maxillary antrum. See additional report of CT maxillofacial same date.  COGNITION: Overall cognitive status: Within functional limits for tasks assessed   SENSATION: WFL   POSTURE:  rounded shoulders, forward head, increased thoracic kyphosis,  posterior pelvic tilt, and flexed trunk   Cervical ROM:   Generally limited, painfree  STRENGTH: WFL  BED MOBILITY:  Gets dizzy when first laying down  PATIENT SURVEYS:  FOTO 55, expected to be at 71  VESTIBULAR ASSESSMENT:  [facility] PT Assessment - 10/05/22 0001       Functional Gait  Assessment   Gait assessed  Yes    Gait Level Surface Walks 20 ft in less than 7 sec but greater than 5.5 sec, uses assistive device, slower speed, mild gait deviations, or deviates 6-10 in outside of the 12 in walkway width.    Change in Gait Speed Able to change speed, demonstrates mild gait deviations, deviates 6-10 in outside of the 12 in walkway width, or no gait deviations, unable to achieve a major change in velocity, or uses a change in velocity, or uses an assistive device.  Gait with Horizontal Head Turns Performs head turns smoothly with slight change in gait velocity (eg, minor disruption to smooth gait path), deviates 6-10 in outside 12 in walkway width, or uses an assistive device.    Gait with Vertical Head Turns Performs task with moderate change in gait velocity, slows down, deviates 10-15 in outside 12 in walkway width but recovers, can continue to walk.    Gait and Pivot Turn Pivot turns safely in greater than 3 sec and stops with no loss of balance, or pivot turns safely within 3 sec and stops with mild imbalance, requires small steps to catch balance.    Step Over Obstacle Is able to step over one shoe box (4.5 in total height) without changing gait speed. No evidence of imbalance.    Gait with Narrow Base of Support Ambulates 4-7 steps.    Gait with Eyes Closed Walks 20 ft, slow speed, abnormal gait pattern, evidence for imbalance, deviates 10-15 in outside 12 in walkway width. Requires more than 9 sec to ambulate 20 ft.    Ambulating Backwards Walks 20 ft, uses assistive device, slower speed, mild gait deviations, deviates 6-10 in outside 12 in walkway width.    Steps Alternating feet,  must use rail.    Total Score 17              VESTIBULAR TREATMENT:                                                                                                   Standing Balance: Surface: Floor and Pillows Position: Feet Hip Width Apart Completed with: Eyes Open and Eyes Closed; Head Turns x 30 Reps and Head Nods x 30 Reps  -sit <> stand no UE with verbal cues to exhale on standing    PATIENT EDUCATION: Education details: initial HEP  Person educated: Patient Education method: Explanation Education comprehension: verbalized understanding and needs further education  HOME EXERCISE PROGRAM: Balance: Eyes Open - Bilateral (Varied Surfaces)   *MUST DO IN A CORNER Stand, feet shoulder width, eyes open. Maintain balance _30___ seconds. Repeat _3___ times per set. Do __1__ sets per session. Do __7__ sessions per week. Repeat on compliant surface: pillow. Once this is easy, close your eyes.  Then, eyes open: turn your head side to side (do this on solid ground, then on a pillow).  GOALS: Goals reviewed with patient? Yes  SHORT TERM GOALS: = LTG based on PT POC  LONG TERM GOALS: Target date: 10/28/22  Pt will be independent with final HEP for improved symptom report  Baseline: to be provided Goal status: INITIAL  2.  Pt will improve FGA to >/= 22/30 to demonstrate improved balance and reduced fall risk  Baseline: 17/30 Goal status: INITIAL  3.  Patient will improve FOTO score to >/= 57 to demonstrate reduction in symptoms Baseline: 55 Goal status: INITIAL   ASSESSMENT:  CLINICAL IMPRESSION: Patient seen for skilled PT session with emphasis on completing balance assessment and initiating HEP. Patient demonstrates increased fall risk as noted by score of 17/30 on  Functional Gait Assessment.   <  22/30 = predictive of falls, <20/30 = fall in 6 months, <18/30 = predictive of falls in PD MCID: 5 points stroke population, 4 points geriatric population (ANPTA Core  Set of Outcome Measures for Adults with Neurologic Conditions, 2018). Patient able to complete standing balance tasks in corner with some increase in dizziness reported, but no overt LOB. Continue POC.     OBJECTIVE IMPAIRMENTS: Abnormal gait, cardiopulmonary status limiting activity, decreased balance, decreased endurance, decreased knowledge of condition, and dizziness.   ACTIVITY LIMITATIONS: carrying, lifting, bending, squatting, bed mobility, hygiene/grooming, locomotion level, and caring for others  PARTICIPATION LIMITATIONS: meal prep, cleaning, interpersonal relationship, driving, shopping, and community activity  PERSONAL FACTORS: Age, Fitness, Past/current experiences, Sex, Time since onset of injury/illness/exacerbation, Transportation, and 3+ comorbidities: see above  are also affecting patient's functional outcome.   REHAB POTENTIAL: Fair time since onset  CLINICAL DECISION MAKING: Stable/uncomplicated  EVALUATION COMPLEXITY: Low   PLAN:  PT FREQUENCY: 1x/week  PT DURATION: 4 weeks  PLANNED INTERVENTIONS: Therapeutic exercises, Therapeutic activity, Neuromuscular re-education, Balance training, Gait training, Patient/Family education, Self Care, Joint mobilization, Stair training, Vestibular training, Canalith repositioning, Visual/preceptual remediation/compensation, DME instructions, Aquatic Therapy, Manual therapy, and Re-evaluation  PLAN FOR NEXT SESSION: VOR/habituation   Westley Foots, PT Westley Foots, PT, DPT, CBIS  10/05/2022, 3:00 PM

## 2022-10-11 ENCOUNTER — Ambulatory Visit: Payer: Medicare PPO | Admitting: Physical Therapy

## 2022-10-18 ENCOUNTER — Ambulatory Visit: Payer: Medicare PPO | Attending: Internal Medicine | Admitting: Physical Therapy

## 2022-10-18 ENCOUNTER — Encounter: Payer: Self-pay | Admitting: Physical Therapy

## 2022-10-18 VITALS — BP 132/70 | HR 91

## 2022-10-18 DIAGNOSIS — R2681 Unsteadiness on feet: Secondary | ICD-10-CM | POA: Insufficient documentation

## 2022-10-18 DIAGNOSIS — R42 Dizziness and giddiness: Secondary | ICD-10-CM | POA: Insufficient documentation

## 2022-10-18 DIAGNOSIS — R2689 Other abnormalities of gait and mobility: Secondary | ICD-10-CM | POA: Insufficient documentation

## 2022-10-18 DIAGNOSIS — M6281 Muscle weakness (generalized): Secondary | ICD-10-CM | POA: Diagnosis not present

## 2022-10-18 NOTE — Therapy (Signed)
OUTPATIENT PHYSICAL THERAPY VESTIBULAR TREATMENT     Patient Name: [name] MRN: [id number] DOB:1927-09-22, 89 y.o., female Today's Date: 10/18/2022  END OF SESSION:  PT End of Session - 10/18/22 1448     Visit Number 3    Number of Visits 5    Date for PT Re-Evaluation 10/28/22    Authorization Type [employer]    PT Start Time 1446    PT Stop Time 1530    PT Time Calculation (min) 44 min    Equipment Utilized During Treatment Gait belt    Activity Tolerance Patient tolerated treatment well    Behavior During Therapy WFL for tasks assessed/performed             Past Medical History:  Diagnosis Date   Abnormal Pap smear 1975-76   Acute deep vein thrombosis (DVT) of left peroneal vein (HCC)    Anemia    history of   Arthritis    spine   Breast cyst, left 1980   Cystocele 2012   Diverticulosis    with h/o Diverticulitis   DVT, lower extremity, distal, chronic (HCC) 12/24/2018   GERD (gastroesophageal reflux disease)    H/O hypercholesterolemia    H/O osteopenia    H/O varicella    H/O varicose veins    Hx of Mumps    Macular degeneration    Seasonal allergies    TIA (transient ischemic attack)    Urge incontinence 2012   Urinary frequency 2010   Past Surgical History:  Procedure Laterality Date   14-Day Zio Patch  12/2021   Predominantly sinus rhythm open and 47-1:15 PM, average 70 bpm (1  AVB); rare isolated PACs PVCs.  55 atrial runs 4-28 beats fastest heart rate ranges 169 to 176 bpm.  Episodes lasting 1.8 to 15 seconds.  No sustained arrhythmias.  Symptoms not noted with atrial runs.   ABDOMINAL HYSTERECTOMY     BLADDER SUSPENSION N/A 06/16/2016   Procedure: TRANSVAGINAL TAPE (TVT) PROCEDURE;  Surgeon: Osborn Coho, MD;  Location: [facility];  Service: Gynecology;  Laterality: N/A;   BREAST CYST ASPIRATION  1964   COLONOSCOPY     Dr. Kinnie Scales   CT CTA CORONARY W/CA SCORE W/CM &/OR WO/CM  11/2021   1. Mild mixed non-obstructive CAD, CADRADS =  2.   2. Coronary calcium score of 255. This was 73rd percentile for age and sex matched control (based on an 89 yo).  3. Normal coronary origin with right dominance.   4. Aortic and mitral annular calcification.  5. Aortic atherosclerosis   CYSTOCELE REPAIR N/A 06/16/2016   Procedure: ANTERIOR REPAIR (CYSTOCELE);  Surgeon: Osborn Coho, MD;  Location: [facility];  Service: Gynecology;  Laterality: N/A;   CYSTOSCOPY N/A 06/16/2016   Procedure: CYSTOSCOPY;  Surgeon: Osborn Coho, MD;  Location: [facility];  Service: Gynecology;  Laterality: N/A;  see anterior repair   Lower Extremity Venous Dopplers  01/09/2012   Right and left lower steroids: No evidence of thrombus or, thrombophlebitis; right and left GSV and SSV: No venous insufficiency. Normal exam.   NM MYOVIEW LTD  11/2016   6.6 METS.  Reached 106% of max.  Heart rate.  Walk for 4:40 min.  EF 72%.  Normal blood pressure response.  upsloping ST segment depression, nonspecific.  Otherwise normal study.  LOW RISK.  No evidence of ischemia or infarction.   TRANSTHORACIC ECHOCARDIOGRAM  11/2021   Normal LV Size & Fxn - EF 60-65%. No RWMA. ~ Diastolic Fxn. Degenerative  MV - Mod MAC. ~ Mod AS   Patient Active Problem List   Diagnosis Date Noted   Dysautonomia orthostatic hypotension syndrome 07/27/2022   Syncope 06/22/2022   Fracture of other specified skull and facial bones, left side, initial encounter for closed fracture (HCC) 06/22/2022   Distal radial fracture 06/22/2022   Chronic kidney disease, stage 3b (HCC) 06/22/2022   Elevated troponin 06/22/2022   Acute urinary retention 06/22/2022   Dizziness, nonspecific 11/22/2021   Esophageal dysmotility 10/23/2021   Precordial pain 10/19/2021   Dyslipidemia 10/19/2021   History of TIA (transient ischemic attack) 10/19/2021   Vertigo 10/19/2021   Abnormal weight loss 10/19/2021   History of DVT of lower extremity 04/04/2021   Chronic kidney disease, stage 3a (HCC) 04/04/2021   AKI (acute  kidney injury) (HCC) 04/04/2021   Unintentional weight loss 04/04/2021   Dyslipidemia, goal LDL below 100 04/12/2020   Palpitations 05/30/2018   Irregular heartbeat 03/02/2018   TIA (transient ischemic attack) 02/15/2018   Urinary, incontinence, stress female 06/16/2016   Right carotid bruit 12/23/2014   DOE (dyspnea on exertion) 12/24/2013   Bilateral arm numbness and tingling while sleeping - and shortly after waking 12/24/2013   Poor balance 12/24/2013   Edema of left lower extremity 12/21/2012   Essential hypertension    H/O hypercholesterolemia    Osteoporosis - of the spine 09/22/2011    PCP:   Thana Ates, MD   REFERRING PROVIDER:   Thana Ates, MD    REFERRING DIAG: R42 (ICD-10-CM) - Dizziness and giddiness   THERAPY DIAG:  Dizziness and giddiness  Unsteadiness on feet  Muscle weakness (generalized)  Other abnormalities of gait and mobility  ONSET DATE: 06/22/22 fall  Rationale for Evaluation and Treatment: Rehabilitation  SUBJECTIVE:   SUBJECTIVE STATEMENT: Patient reports that she had to cancel last week due to being so dizzy she didn't feel safe to drive herself. She is feeling much better today. Denies falls/near falls.  Pt accompanied by: self  PERTINENT HISTORY: TIA, Stage 3 CKD, urinary frequency and incontinence, macular degeneration, osteopenia, hypercholesterolemia, GERD, diverticulosis, IBS, stable angina pectoris, cystocele with repair, OA, anemia   PAIN:  Are you having pain? No  PRECAUTIONS: Fall  PATIENT GOALS: "to get my wrist well and not get dizzy"  OBJECTIVE:   DIAGNOSTIC FINDINGS:  06/22/22 Head CT IMPRESSION: 1. No acute intracranial abnormalities. Chronic atrophy and small vessel ischemic changes. 2. Left facial fractures with air-fluid level in the left maxillary antrum. See additional report of CT maxillofacial same date.  COGNITION: Overall cognitive status: Within functional limits for tasks  assessed   SENSATION: WFL  POSTURE:  rounded shoulders, forward head, increased thoracic kyphosis, posterior pelvic tilt, and flexed trunk   Cervical ROM:   Generally limited, painfree  STRENGTH: WFL  BED MOBILITY:  Gets dizzy when first laying down  PATIENT SURVEYS:  FOTO 55, expected to be at 59  VESTIBULAR ASSESSMENT:  VESTIBULAR TREATMENT:  Vitals:   10/18/22 1451 10/18/22 1453  BP: (!) 111/92 132/70  Pulse: 87 91  Initial reading was taken on seated on RUE. Second reading performed in standing after ~ 2 minutes.   SPO2  during session: 96-98%   VESTIBULAR TREATMENT:  Gaze Adaptation: x1 Viewing Horizontal: Position: seated, Time: 45 seconds, Reps: 2, and Comment: reported only very slight increase in symptoms, reports greater increase in symptoms on second rep and x1 Viewing Vertical:  Position: seated, Time: 45 seconds, Reps: 2, and Comment: reports greater challenge going up and down  Habituation: Standing on airex placing cards on opposite cabinet doors with head turns 2 x 30 cards (reports feeling SOB and dizzy at end)  (CGA) Stacking 6 cones on low mat mixing up UE 3 x 6 cones (SBA)  Stacking 6 cones on low mat mixing up UE 4 x 6 cones with cross body reaching and looking back (SBA)  Reports mild to moderate levels of dizziness, appears partially due to increased cardiac demand  PATIENT EDUCATION: Education details: initial HEP  Person educated: Patient Education method: Explanation Education comprehension: verbalized understanding and needs further education  HOME EXERCISE PROGRAM: Balance: Eyes Open - Bilateral (Varied Surfaces)   *MUST DO IN A CORNER Stand, feet shoulder width, eyes open. Maintain balance _30___ seconds. Repeat _3___ times per set. Do __1__ sets per session. Do __7__ sessions per week. Repeat on compliant surface: pillow. Once this is  easy, close your eyes.  Then, eyes open: turn your head side to side (do this on solid ground, then on a pillow).  Gaze Stabilization: Sitting    Keeping eyes on target on wall 3-5 feet away, tilt head down 15-30 and move head side to side for 45 seconds. Repeat while moving head up and down for 45 seconds. Do 6 sessions per day. Repeat using target on pattern background.    GOALS: Goals reviewed with patient? Yes  SHORT TERM GOALS: = LTG based on PT POC  LONG TERM GOALS: Target date: 10/28/22  Pt will be independent with final HEP for improved symptom report  Baseline: to be provided Goal status: INITIAL  2.  Pt will improve FGA to >/= 22/30 to demonstrate improved balance and reduced fall risk  Baseline: 17/30 Goal status: INITIAL  3.  Patient will improve FOTO score to >/= 57 to demonstrate reduction in symptoms Baseline: 55 Goal status: INITIAL   ASSESSMENT:  CLINICAL IMPRESSION: Patient seen for skilled PT session with emphasis on habituation and gaze adaptation tasks. Patient dizziness appears to have large cardiac contributor as fatigue and standing exacerbate symptoms of SOB though SPO2 in good range. Vestibular component also likely contributing as head turns also increase symptoms. Patient to follow up with cardiology and already schedule. Patient reports greater symptoms with vertical head turns. Continue POC as able.   OBJECTIVE IMPAIRMENTS: Abnormal gait, cardiopulmonary status limiting activity, decreased balance, decreased endurance, decreased knowledge of condition, and dizziness.   ACTIVITY LIMITATIONS: carrying, lifting, bending, squatting, bed mobility, hygiene/grooming, locomotion level, and caring for others  PARTICIPATION LIMITATIONS: meal prep, cleaning, interpersonal relationship, driving, shopping, and community activity  PERSONAL FACTORS: Age, Fitness, Past/current experiences, Sex, Time since onset of injury/illness/exacerbation, Transportation,  and 3+ comorbidities: see above  are also affecting patient's functional outcome.   REHAB POTENTIAL: Fair time since onset  CLINICAL DECISION MAKING: Stable/uncomplicated  EVALUATION COMPLEXITY: Low   PLAN:  PT FREQUENCY: 1x/week  PT DURATION: 4 weeks  PLANNED INTERVENTIONS: Therapeutic exercises, Therapeutic activity, Neuromuscular re-education,  Balance training, Gait training, Patient/Family education, Self Care, Joint mobilization, Stair training, Vestibular training, Canalith repositioning, Visual/preceptual remediation/compensation, DME instructions, Aquatic Therapy, Manual therapy, and Re-evaluation  PLAN FOR NEXT SESSION: progress VOR/habituation and general balance with head turns, add missed visits as indicated and re-cert if needed   Carmelia Bake, PT, DPT  10/18/2022, 4:15 PM

## 2022-10-25 ENCOUNTER — Ambulatory Visit: Payer: Medicare PPO | Admitting: Physical Therapy

## 2022-10-25 DIAGNOSIS — R2681 Unsteadiness on feet: Secondary | ICD-10-CM

## 2022-10-25 DIAGNOSIS — R42 Dizziness and giddiness: Secondary | ICD-10-CM | POA: Diagnosis not present

## 2022-10-25 DIAGNOSIS — R2689 Other abnormalities of gait and mobility: Secondary | ICD-10-CM | POA: Diagnosis not present

## 2022-10-25 DIAGNOSIS — M6281 Muscle weakness (generalized): Secondary | ICD-10-CM | POA: Diagnosis not present

## 2022-10-25 NOTE — Therapy (Unsigned)
OUTPATIENT PHYSICAL THERAPY VESTIBULAR TREATMENT/DISCHARGE SUMMARY     Patient Name: [name] MRN: [id number] DOB:04-Aug-1927, 89 y.o., female Today's Date: 10/26/2022  END OF SESSION:  PT End of Session - 10/26/22 2005     Visit Number 4    Number of Visits 5    Date for PT Re-Evaluation 10/28/22    Authorization Type [employer]    PT Start Time 1450    PT Stop Time 1530    PT Time Calculation (min) 40 min    Equipment Utilized During Treatment Gait belt    Activity Tolerance Patient tolerated treatment well    Behavior During Therapy WFL for tasks assessed/performed              Past Medical History:  Diagnosis Date   Abnormal Pap smear 1975-76   Acute deep vein thrombosis (DVT) of left peroneal vein (HCC)    Anemia    history of   Arthritis    spine   Breast cyst, left 1980   Cystocele 2012   Diverticulosis    with h/o Diverticulitis   DVT, lower extremity, distal, chronic (HCC) 12/24/2018   GERD (gastroesophageal reflux disease)    H/O hypercholesterolemia    H/O osteopenia    H/O varicella    H/O varicose veins    Hx of Mumps    Macular degeneration    Seasonal allergies    TIA (transient ischemic attack)    Urge incontinence 2012   Urinary frequency 2010   Past Surgical History:  Procedure Laterality Date   14-Day Zio Patch  12/2021   Predominantly sinus rhythm open and 47-1:15 PM, average 70 bpm (1  AVB); rare isolated PACs PVCs.  55 atrial runs 4-28 beats fastest heart rate ranges 169 to 176 bpm.  Episodes lasting 1.8 to 15 seconds.  No sustained arrhythmias.  Symptoms not noted with atrial runs.   ABDOMINAL HYSTERECTOMY     BLADDER SUSPENSION N/A 06/16/2016   Procedure: TRANSVAGINAL TAPE (TVT) PROCEDURE;  Surgeon: Osborn Coho, MD;  Location: [facility];  Service: Gynecology;  Laterality: N/A;   BREAST CYST ASPIRATION  1964   COLONOSCOPY     Dr. Kinnie Scales   CT CTA CORONARY W/CA SCORE W/CM &/OR WO/CM  11/2021   1. Mild mixed  non-obstructive CAD, CADRADS = 2.   2. Coronary calcium score of 255. This was 73rd percentile for age and sex matched control (based on an 89 yo).  3. Normal coronary origin with right dominance.   4. Aortic and mitral annular calcification.  5. Aortic atherosclerosis   CYSTOCELE REPAIR N/A 06/16/2016   Procedure: ANTERIOR REPAIR (CYSTOCELE);  Surgeon: Osborn Coho, MD;  Location: [facility];  Service: Gynecology;  Laterality: N/A;   CYSTOSCOPY N/A 06/16/2016   Procedure: CYSTOSCOPY;  Surgeon: Osborn Coho, MD;  Location: [facility];  Service: Gynecology;  Laterality: N/A;  see anterior repair   Lower Extremity Venous Dopplers  01/09/2012   Right and left lower steroids: No evidence of thrombus or, thrombophlebitis; right and left GSV and SSV: No venous insufficiency. Normal exam.   NM MYOVIEW LTD  11/2016   6.6 METS.  Reached 106% of max.  Heart rate.  Walk for 4:40 min.  EF 72%.  Normal blood pressure response.  upsloping ST segment depression, nonspecific.  Otherwise normal study.  LOW RISK.  No evidence of ischemia or infarction.   TRANSTHORACIC ECHOCARDIOGRAM  11/2021   Normal LV Size & Fxn - EF 60-65%. No RWMA. ~ Diastolic  Fxn. Degenerative MV - Mod MAC. ~ Mod AS   Patient Active Problem List   Diagnosis Date Noted   Dysautonomia orthostatic hypotension syndrome 07/27/2022   Syncope 06/22/2022   Fracture of other specified skull and facial bones, left side, initial encounter for closed fracture (HCC) 06/22/2022   Distal radial fracture 06/22/2022   Chronic kidney disease, stage 3b (HCC) 06/22/2022   Elevated troponin 06/22/2022   Acute urinary retention 06/22/2022   Dizziness, nonspecific 11/22/2021   Esophageal dysmotility 10/23/2021   Precordial pain 10/19/2021   Dyslipidemia 10/19/2021   History of TIA (transient ischemic attack) 10/19/2021   Vertigo 10/19/2021   Abnormal weight loss 10/19/2021   History of DVT of lower extremity 04/04/2021   Chronic kidney disease, stage 3a  (HCC) 04/04/2021   AKI (acute kidney injury) (HCC) 04/04/2021   Unintentional weight loss 04/04/2021   Dyslipidemia, goal LDL below 100 04/12/2020   Palpitations 05/30/2018   Irregular heartbeat 03/02/2018   TIA (transient ischemic attack) 02/15/2018   Urinary, incontinence, stress female 06/16/2016   Right carotid bruit 12/23/2014   DOE (dyspnea on exertion) 12/24/2013   Bilateral arm numbness and tingling while sleeping - and shortly after waking 12/24/2013   Poor balance 12/24/2013   Edema of left lower extremity 12/21/2012   Essential hypertension    H/O hypercholesterolemia    Osteoporosis - of the spine 09/22/2011    PCP:   Thana Ates, MD   REFERRING PROVIDER:   Thana Ates, MD    REFERRING DIAG: R42 (ICD-10-CM) - Dizziness and giddiness   THERAPY DIAG:  Unsteadiness on feet  Dizziness and giddiness  Other abnormalities of gait and mobility  ONSET DATE: 06/22/22 fall  Rationale for Evaluation and Treatment: Rehabilitation  SUBJECTIVE:   SUBJECTIVE STATEMENT: Patient reports that she drove herself to PT today; says she has orthopedic appt tomorrow (Wed.) and cardiac appt on Friday   Pt accompanied by: self  PERTINENT HISTORY: TIA, Stage 3 CKD, urinary frequency and incontinence, macular degeneration, osteopenia, hypercholesterolemia, GERD, diverticulosis, IBS, stable angina pectoris, cystocele with repair, OA, anemia   PAIN:  Are you having pain? No  PRECAUTIONS: Fall  PATIENT GOALS: "to get my wrist well and not get dizzy"  OBJECTIVE:   DIAGNOSTIC FINDINGS:  06/22/22 Head CT IMPRESSION: 1. No acute intracranial abnormalities. Chronic atrophy and small vessel ischemic changes. 2. Left facial fractures with air-fluid level in the left maxillary antrum. See additional report of CT maxillofacial same date.  COGNITION: Overall cognitive status: Within functional limits for tasks assessed   SENSATION: WFL  POSTURE:  rounded shoulders, forward  head, increased thoracic kyphosis, posterior pelvic tilt, and flexed trunk   Cervical ROM:   Generally limited, painfree  STRENGTH: WFL  BED MOBILITY:  Gets dizzy when first laying down  PATIENT SURVEYS:  FOTO 55, expected to be at 83  Gait:   10/25/22 0001  Functional Gait  Assessment  Gait Level Surface 1 (8.6, 8.97)  Change in Gait Speed 2  Gait with Horizontal Head Turns 2  Gait with Vertical Head Turns 2  Gait and Pivot Turn 2  Step Over Obstacle 2  Gait with Narrow Base of Support 1  Gait with Eyes Closed 1  Ambulating Backwards 2  Steps 2  Total Score 17   Pt performed following exercises and then added these to HEP:   Marching 10 reps each leg in place   SLS - 10 sec hold x 1 rep on each leg  Sit to stand transfers from standard chair  Access Code: [email or web address] URL: [email or web address] Date: 10/26/2022 Prepared by: Maebelle Munroe  Exercises - Standing March with Counter Support  - 1 x daily - 7 x weekly - 1 sets - 10 reps - Single Leg Stance with Support  - 1 x daily - 7 x weekly - 1 sets - 2 reps - 10 sec hold - Sit to Stand  - 1 x daily - 7 x weekly - 1 sets - 10 reps                                                                                                   PATIENT EDUCATION: Education details: 10-25-22 [employer] HEP:  Person educated: Patient Education method: Explanation Education comprehension: verbalized understanding and needs further education  HOME EXERCISE PROGRAM: Balance: Eyes Open - Bilateral (Varied Surfaces)   *MUST DO IN A CORNER Stand, feet shoulder width, eyes open. Maintain balance _30___ seconds. Repeat _3___ times per set. Do __1__ sets per session. Do __7__ sessions per week. Repeat on compliant surface: pillow. Once this is easy, close your eyes.  Then, eyes open: turn your head side to side (do this on solid ground, then on a pillow).  Gaze Stabilization: Sitting    Keeping eyes on target on wall 3-5  feet away, tilt head down 15-30 and move head side to side for 45 seconds. Repeat while moving head up and down for 45 seconds. Do 6 sessions per day. Repeat using target on pattern background.    GOALS: Goals reviewed with patient? Yes  SHORT TERM GOALS: = LTG based on PT POC  LONG TERM GOALS: Target date: 10/28/22  Pt will be independent with final HEP for improved symptom report  Baseline: to be provided Goal status: Goal met - 10-25-22  2.  Pt will improve FGA to >/= 22/30 to demonstrate improved balance and reduced fall risk  Baseline: 17/30; score 17/30 on 10-25-22 Goal status: Not met 10-25-22  3.  Patient will improve FOTO score to >/= 57 to demonstrate reduction in symptoms Baseline: 55; score 45 on 10-25-22 Goal status:  Not met 10-25-22   ASSESSMENT:  CLINICAL IMPRESSION:  PT session focused on assessment of LTG's for discharge.  Pt has met LTG #1; LTG #2 not met as FGA score remains 17/30, same as score when tested initially.  LTG #3 not met as FOTO score = 45, decreased from score of 55 at initial eval.  Pt's c/o dizziness appears to be multi-factorial in etiology.  D/C due to end of authorization period and pt's request.   OBJECTIVE IMPAIRMENTS: Abnormal gait, cardiopulmonary status limiting activity, decreased balance, decreased endurance, decreased knowledge of condition, and dizziness.   ACTIVITY LIMITATIONS: carrying, lifting, bending, squatting, bed mobility, hygiene/grooming, locomotion level, and caring for others  PARTICIPATION LIMITATIONS: meal prep, cleaning, interpersonal relationship, driving, shopping, and community activity  PERSONAL FACTORS: Age, Fitness, Past/current experiences, Sex, Time since onset of injury/illness/exacerbation, Transportation, and 3+ comorbidities: see above  are also affecting patient's functional outcome.   REHAB POTENTIAL: Fair time since onset  CLINICAL  DECISION MAKING: Stable/uncomplicated  EVALUATION COMPLEXITY:  Low   PLAN:  PT FREQUENCY: 1x/week  PT DURATION: 4 weeks  PLANNED INTERVENTIONS: Therapeutic exercises, Therapeutic activity, Neuromuscular re-education, Balance training, Gait training, Patient/Family education, Self Care, Joint mobilization, Stair training, Vestibular training, Canalith repositioning, Visual/preceptual remediation/compensation, DME instructions, Aquatic Therapy, Manual therapy, and Re-evaluation  PLAN FOR NEXT SESSION: D/C on 10-25-22   PHYSICAL THERAPY DISCHARGE SUMMARY  Visits from Start of Care: 4  Current functional level related to goals / functional outcomes: See above for progress towards goals   Remaining deficits: Continued decreased balance during gait with FGA score remaining 17/30 Continued c/o dizziness - appears to be multi-factorial in etiology   Education / Equipment: Pt has been instructed in HEP for balance exercises and also in x1 viewing exercise   Patient agrees to discharge. Patient goals were partially met. Patient is being discharged due to the patient's request. Also due to end of authorization period for PT visits at this time.  Pt has several other appts scheduled including orthopedic for Rt wrist and cardiology appt - pt was informed that she may return to PT in the future with new referral from MD.  Pt verbalized understanding and agreement.    Kary Kos, PT  10/26/2022, 8:07 PM

## 2022-10-26 ENCOUNTER — Encounter: Payer: Self-pay | Admitting: Physical Therapy

## 2022-10-26 DIAGNOSIS — S52514D Nondisplaced fracture of right radial styloid process, subsequent encounter for closed fracture with routine healing: Secondary | ICD-10-CM | POA: Diagnosis not present

## 2022-10-26 NOTE — Progress Notes (Deleted)
Cardiology Clinic Note   Patient Name: [name] Date of Encounter: 10/26/2022  Primary Care Provider:  Thana Ates, MD Primary Cardiologist:  Bryan Lemma, MD  Patient Profile    [name] is a 89 year old female with history of labile hypertension only taking Toprol in the evening but holding if her blood pressures were less than 110 systolic.  Her blood pressures could be tolerated as high as anything less than 180 mmHg.  She was started on midodrine 2.5 mg in the morning to avoid orthostatic blood pressures.  Other history includes dyslipidemia, but no longer on statin therapy due to advanced age.  Last seen by Dr. Herbie Baltimore on 07/27/2022.  Past Medical History    Past Medical History:  Diagnosis Date   Abnormal Pap smear 1975-76   Acute deep vein thrombosis (DVT) of left peroneal vein (HCC)    Anemia    history of   Arthritis    spine   Breast cyst, left 1980   Cystocele 2012   Diverticulosis    with h/o Diverticulitis   DVT, lower extremity, distal, chronic (HCC) 12/24/2018   GERD (gastroesophageal reflux disease)    H/O hypercholesterolemia    H/O osteopenia    H/O varicella    H/O varicose veins    Hx of Mumps    Macular degeneration    Seasonal allergies    TIA (transient ischemic attack)    Urge incontinence 2012   Urinary frequency 2010   Past Surgical History:  Procedure Laterality Date   14-Day Zio Patch  12/2021   Predominantly sinus rhythm open and 47-1:15 PM, average 70 bpm (1  AVB); rare isolated PACs PVCs.  55 atrial runs 4-28 beats fastest heart rate ranges 169 to 176 bpm.  Episodes lasting 1.8 to 15 seconds.  No sustained arrhythmias.  Symptoms not noted with atrial runs.   ABDOMINAL HYSTERECTOMY     BLADDER SUSPENSION N/A 06/16/2016   Procedure: TRANSVAGINAL TAPE (TVT) PROCEDURE;  Surgeon: Osborn Coho, MD;  Location: [facility];  Service: Gynecology;  Laterality: N/A;   BREAST CYST ASPIRATION  1964   COLONOSCOPY     Dr. Kinnie Scales   CT CTA  CORONARY W/CA SCORE W/CM &/OR WO/CM  11/2021   1. Mild mixed non-obstructive CAD, CADRADS = 2.   2. Coronary calcium score of 255. This was 73rd percentile for age and sex matched control (based on an 89 yo).  3. Normal coronary origin with right dominance.   4. Aortic and mitral annular calcification.  5. Aortic atherosclerosis   CYSTOCELE REPAIR N/A 06/16/2016   Procedure: ANTERIOR REPAIR (CYSTOCELE);  Surgeon: Osborn Coho, MD;  Location: [facility];  Service: Gynecology;  Laterality: N/A;   CYSTOSCOPY N/A 06/16/2016   Procedure: CYSTOSCOPY;  Surgeon: Osborn Coho, MD;  Location: [facility];  Service: Gynecology;  Laterality: N/A;  see anterior repair   Lower Extremity Venous Dopplers  01/09/2012   Right and left lower steroids: No evidence of thrombus or, thrombophlebitis; right and left GSV and SSV: No venous insufficiency. Normal exam.   NM MYOVIEW LTD  11/2016   6.6 METS.  Reached 106% of max.  Heart rate.  Walk for 4:40 min.  EF 72%.  Normal blood pressure response.  upsloping ST segment depression, nonspecific.  Otherwise normal study.  LOW RISK.  No evidence of ischemia or infarction.   TRANSTHORACIC ECHOCARDIOGRAM  11/2021   Normal LV Size & Fxn - EF 60-65%. No RWMA. ~ Diastolic Fxn. Degenerative MV - Mod  MAC. ~ Mod AS    Allergies  Allergies  Allergen Reactions   Atorvastatin Other (See Comments)    Unknown reaction   Desmopressin Acetate Other (See Comments)   Iodinated Contrast Media Hives and Other (See Comments)   Myrbetriq [Mirabegron] Other (See Comments)    "Makes me urinate more frequently"   Iodine Rash    History of Present Illness    [name] returns today for ongoing assessment and management of labile hypertension.  She was orthostatic in the morning and had recently been started on midodrine 2.5 mg every morning by Dr. Herbie Baltimore on last office visit July 2024 to avoid   Home Medications    Current Outpatient Medications  Medication Sig Dispense Refill    acetaminophen (TYLENOL) 500 MG tablet Take 500-1,000 mg by mouth every 8 (eight) hours as needed for mild pain.      albuterol (VENTOLIN HFA) 108 (90 Base) MCG/ACT inhaler Inhale 2 puffs into the lungs every 4 (four) hours as needed for wheezing or shortness of breath.      alendronate (FOSAMAX) 70 MG tablet Take 1 tablet by mouth once a week.     aspirin EC 81 MG tablet Take 1 tablet (81 mg total) by mouth every evening. Swallow whole. 30 tablet 11   Besifloxacin HCl (BESIVANCE) 0.6 % SUSP PLACE 1 DROP IN RIGHT EYE 4 TIMES DAILY FOR 2 DAYS     Cholecalciferol (VITAMIN D-3) 25 MCG (1000 UT) CAPS Take 1,000 Units by mouth daily with breakfast.      clobetasol (TEMOVATE) 0.05 % external solution Apply 1 Application topically as needed.     famotidine (PEPCID) 40 MG tablet Take 40 mg by mouth daily. (Patient not taking: Reported on 07/27/2022)     HYDROcodone-acetaminophen (NORCO/VICODIN) 5-325 MG tablet Take 1 tablet by mouth every 6 (six) hours as needed for severe pain. (Patient not taking: Reported on 07/27/2022) 30 tablet 0   Lactobacillus Rhamnosus, GG, (CULTURELLE) CAPS Take 1 capsule by mouth every morning.      Magnesium 250 MG TABS Take 250 mg by mouth daily as needed (leg cramps).     metoprolol succinate (TOPROL-XL) 25 MG 24 hr tablet Take 1 tablet (25 mg total) by mouth every evening. Kapspargo sprinkle 90 tablet 3   midodrine (PROAMATINE) 2.5 MG tablet Take 1 tablet (2.5 mg total) by mouth daily after breakfast. 90 tablet 3   mirabegron ER (MYRBETRIQ) 50 MG TB24 tablet Take 1 tablet by mouth at bedtime.     Multiple Vitamins-Minerals (AIRBORNE PO) Take 1 tablet by mouth daily with breakfast.     Multiple Vitamins-Minerals (CENTRUM SILVER ULTRA WOMENS) TABS Take 1 tablet by mouth daily with breakfast.      Multiple Vitamins-Minerals (ICAPS AREDS 2 PO) Take 1 capsule by mouth daily at 12 noon.     Nutritional Supplements (,FEEDING SUPPLEMENT, PROSOURCE PLUS) liquid Take 30 mLs by mouth 2  (two) times daily between meals. 887 mL 0   polyethylene glycol (MIRALAX / GLYCOLAX) 17 g packet Take 17 g by mouth daily as needed. 14 each 0   Polyvinyl Alcohol-Povidone (REFRESH OP) Place 1 drop into both eyes daily as needed (dry eyes).     protein supplement shake (PREMIER PROTEIN) LIQD Take 59.1 mLs (2 oz total) by mouth 3 (three) times daily between meals. (Patient taking differently: Take 2 oz by mouth 3 (three) times daily between meals. BOOST) 10000 mL 0   senna-docusate (SENOKOT-S) 8.6-50 MG tablet Take 2 tablets by  mouth at bedtime as needed for mild constipation. 30 tablet 0   Tiotropium Bromide Monohydrate (SPIRIVA RESPIMAT) 1.25 MCG/ACT AERS Inhale 2 puffs into the lungs daily. 1 each 11   Trospium Chloride 60 MG CP24 Take 1 capsule by mouth daily.     vitamin B-12 (CYANOCOBALAMIN) 100 MCG tablet Take 100 mcg by mouth daily.     No current facility-administered medications for this visit.     Family History    Family History  Problem Relation Age of Onset   Uterine cancer Mother 56   Heart attack Brother 52   Breast cancer Daughter    Stroke Neg Hx    She indicated that her mother is deceased. She indicated that her father is deceased. She indicated that her brother is deceased. She indicated that the status of her daughter is unknown. She indicated that the status of her neg hx is unknown.  Social History    Social History   Socioeconomic History   Marital status: Married    Spouse name: Not on file   Number of children: 3   Years of education: Not on file   Highest education level: Master's degree (e.g., MA, MS, MEng, MEd, MSW, MBA)  Occupational History    Comment: retired Runner, broadcasting/film/video, principal x 21 years  Tobacco Use   Smoking status: Never    Passive exposure: Never   Smokeless tobacco: Never  Vaping Use   Vaping status: Never Used  Substance and Sexual Activity   Alcohol use: No   Drug use: No   Sexual activity: Yes    Birth control/protection: Surgical     Comment: Hysterectomy  Other Topics Concern   Not on file  Social History Narrative   She is a married mother of 3, grandmother of 6, great grandmother of 1. She walks daily about 2 miles a day, does not smoke, does not drink.    Lives with huband   Social Determinants of Health   Financial Resource Strain: Not on file  Food Insecurity: Low Risk  (07/25/2022)   [facility]   Hunger Vital Sign    Worried About Running Out of Food in the Last Year: Never true    Ran Out of Food in the Last Year: Never true  Transportation Needs: Not on file (07/25/2022)  Physical Activity: Not on file  Stress: Not on file  Social Connections: Not on file  Intimate Partner Violence: Not on file     Review of Systems    General:  No chills, fever, night sweats or weight changes.  Cardiovascular:  No chest pain, dyspnea on exertion, edema, orthopnea, palpitations, paroxysmal nocturnal dyspnea. Dermatological: No rash, lesions/masses Respiratory: No cough, dyspnea Urologic: No hematuria, dysuria Abdominal:   No nausea, vomiting, diarrhea, bright red blood per rectum, melena, or hematemesis Neurologic:  No visual changes, wkns, changes in mental status. All other systems reviewed and are otherwise negative except as noted above.       Physical Exam    VS:  There were no vitals taken for this visit. , BMI There is no height or weight on file to calculate BMI.     GEN: Well nourished, well developed, in no acute distress. HEENT: normal. Neck: Supple, no JVD, carotid bruits, or masses. Cardiac: RRR, no murmurs, rubs, or gallops. No clubbing, cyanosis, edema.  Radials/DP/PT 2+ and equal bilaterally.  Respiratory:  Respirations regular and unlabored, clear to auscultation bilaterally. GI: Soft, nontender, nondistended, BS + x  4. MS: no deformity or atrophy. Skin: warm and dry, no rash. Neuro:  Strength and sensation are intact. Psych: Normal affect.      Lab  Results  Component Value Date   WBC 8.1 06/22/2022   HGB 12.1 06/22/2022   HCT 33.4 (L) 06/22/2022   MCV 83.7 06/22/2022   PLT 171 06/22/2022   Lab Results  Component Value Date   CREATININE 1.15 (H) 06/22/2022   BUN 22 06/22/2022   NA 133 (L) 06/22/2022   K 4.4 06/22/2022   CL 101 06/22/2022   CO2 23 06/22/2022   Lab Results  Component Value Date   ALT 13 10/18/2021   AST 17 10/18/2021   ALKPHOS 40 10/18/2021   BILITOT 0.7 10/18/2021   Lab Results  Component Value Date   CHOL 214 (H) 10/20/2021   HDL 81 10/20/2021   LDLCALC 129 (H) 10/20/2021   TRIG 20 10/20/2021   CHOLHDL 2.6 10/20/2021    Lab Results  Component Value Date   HGBA1C 5.7 (H) 04/05/2021     Review of Prior Studies      Assessment & Plan   1.  ***     {Are you ordering a CV Procedure (e.g. stress test, cath, DCCV, TEE, etc)?   Press F2        :[id number]}   Signed, Bettey Mare. Liborio Nixon, ANP, AACC   10/26/2022 3:19 PM      Office [phone] Fax [phone]  Notice: This dictation was prepared with Dragon dictation along with smaller phrase technology. Any transcriptional errors that result from this process are unintentional and may not be corrected upon review.

## 2022-10-26 NOTE — Progress Notes (Signed)
   10/25/22 0001  Functional Gait  Assessment  Gait Level Surface 1 (8.6, 8.97)  Change in Gait Speed 2  Gait with Horizontal Head Turns 2  Gait with Vertical Head Turns 2  Gait and Pivot Turn 2  Step Over Obstacle 2  Gait with Narrow Base of Support 1  Gait with Eyes Closed 1  Ambulating Backwards 2  Steps 2  Total Score 17

## 2022-10-28 ENCOUNTER — Ambulatory Visit: Payer: Medicare PPO | Admitting: Adult Health

## 2022-10-30 NOTE — Progress Notes (Unsigned)
Cardiology Office Note:    Date:  11/08/2022   ID:  [name], [name] 1927/09/12, MRN [id number]  PCP:  Thana Ates, MD  Cardiologist:  Bryan Lemma, MD     Referring MD: Thana Ates, MD   Chief Complaint: follow-up of orthostatic hypotension and syncope   History of Present Illness:    [name] is a 89 y.o. female with a history of mild non-obstructive CAD noted on coronary CTA in 11/2021, palpitations with PACs/ PVCs and short runs of PAT and non-sustained VT noted on prior monitors, moderate aortic stenosis/ mild aortic insufficiency, mild mitral regurgitation, hypertension complicated by dysautonomia orthostatic hypotension, dyslipidemia, DVT in 12/2018, TIA, CKD stage III, GERD with esophageal dysmotility, and anemia who is followed by Dr. Herbie Baltimore and presents today for follow-up or orthostatic hypotension and syncope.  Patient was previously followed by Dr. Elsie Lincoln and now follows with Dr. Herbie Baltimore. Myoview in 11/2016 for further evaluation of  atypical chest pain was low risk and showed no evidence of ischemia. A coronary CTA and Echo were ordered at a visit in 11/2021 for further evaluation of dyspnea on exertion. Coronary CTA showed a coronary calcium score of 255 (73rd percentile for age and sex) and mild mixed non-obstructive CAD. Echo showed LVEF of 60-65% with normal wall motion, normal RV function, moderate aortic stenosis / mil aortic insufficiency, and moderate MAC with mild mitral regurgitation. A Zio monitor was also ordered at that time given reports of dizziness and palpitations.Monitor showed underlying sinus rhythm (average heart rate of 70 bpm) with 55 brief atrial runs (longest episode lasting 28 beats) and rare PVCs/ PACs. Triggered symptoms tended to correlate more with PVCs.    She was admitted in 06/2022 for syncope. She was walking in her living room when she started to feel fain and lightheaded and then passed out and fell. She fractured her right wrist and  also sustained multiple facial fractures. She was seen by ENT, Ortho, and Trauma services and no surgeries interventions were felt to be necessary. Echo showed LVEF of 65-70% with mild LVH and grade 1 diastolic dysfunction, normal RV function, moderate aortic stenosis/ mild aortic regurgitation, mild mitral regurgitation/ stenosis, and a small pericardial effusion. Repeat Zio monitor was ordered and again showed underling sinus rhythm with 21 atrial runs (longet episode last 11 beats), 2 short burts of NSVT (longest run 8 beats), and occasional PVCs (2.2% burden). However, there were not significant arrhythmias to explain her syncopal episode. She was seen by Dr. Herbie Baltimore for follow-up in 07/2022 at which time she denied any recurrent falls but did described orthostasis with lightheaded and dizziness with standing up. She stated that she had not been eating well prior to recent hospitalization. Her syncope was felt to be due to dysautonomia and vasovagal symptoms but she also described symptoms of vertigo. She was started on Midodrine 2.5mg  in the morning and was educated on the importance of staying well hydrated, wearing compression stockings, and elevating legs as much as possible.   Patient presents today for follow-up. Here alone. She denies any recurrent syncopal episode but does report some occasional lightheadedness/ dizziness for which she has to sit down. She denies any palpitations. However, her main complaint today is ongoing respiratory issues. She states that since she had COVID in 2021, she has had dyspnea on exertion. This dyspnea waxes and wanes but she denies any consistently worsening shortness of breath. No significant shortness of breath, new or worsening orthopnea, or PND.  She saw her Pulmonologist yesterday and per review of that note reported episodes of dizziness and presyncope that occur after she gets suddenly dyspneic. She reported improvement with inhalers although there was concern that  she was not using her inhalers appropriately based off direct visualization in the office.  She has chronic lower extremity edema (left > right) but this is stable. She reports intermittent episodes of atypical chest pain over the last 2-3 months that she describes as a sharp, knife-like pain in the center of her chest. This occurs maybe once or twice a months and last less than 1 minute. It occurs at rest. She denies any exertional symptoms.   EKGs/Labs/Other Studies Reviewed:    The following studies were reviewed:  Coronary CTA 12/13/2021: Impressions: 1. Mild mixed non-obstructive CAD, CADRADS = 2. 2. Coronary calcium score of 255. This was 73rd percentile for age and sex matched control (based on an 89 yo). 3. Normal coronary origin with right dominance. 4. Aortic and mitral annular calcification. 5. Aortic atherosclerosis. _______________  Echocardiogram 06/23/2022: Impressions:  1. Left ventricular ejection fraction, by estimation, is 65 to 70%. The  left ventricle has normal function. The left ventricle has no regional  wall motion abnormalities. There is mild concentric left ventricular  hypertrophy. Left ventricular diastolic  parameters are consistent with Grade I diastolic dysfunction (impaired  relaxation).   2. Right ventricular systolic function is normal. The right ventricular  size is normal. There is normal pulmonary artery systolic pressure.   3. A small pericardial effusion is present. The pericardial effusion is  posterior to the left ventricle.   4. The mitral valve is abnormal. Mild mitral valve regurgitation. Mild  mitral stenosis. The mean mitral valve gradient is 5.0 mmHg. Moderate  mitral annular calcification.   5. Fused left and non cusps. The aortic valve is calcified. There is  moderate calcification of the aortic valve. Aortic valve regurgitation is  mild. Moderate aortic valve stenosis. Aortic valve area, by VTI measures  1.09 cm. Aortic valve mean  gradient  measures 21.0 mmHg. Aortic valve Vmax measures 3.22 m/s.   6. The inferior vena cava is normal in size with greater than 50%  respiratory variability, suggesting right atrial pressure of 3 mmHg.  _______________  [facility] Monitor 06/2022:   Predominant Rhythm: Sinus-rate range 48-146 bpm.  Average 79 bpm.   Occasional (2.2%) PVCs with rare couplets, triplets, bigeminy and trigeminy.-Noted on symptoms log.   Rare PACs and PVCs with rare couplets and triplets.   2 short bursts of PVCs (ventricular tachycardia) 6 and 8 beats.  Fastest was 6 beats-max rate 122 bpm (range 110 to 122 bpm, average 118 bpm, 3.3 seconds), longest was 8 beats at an average rate of 105 bpm (range 89-110 bpm, average 105 bpm, 4.8 seconds).   21 Atrial Runs 6-11 beats: Fastest 6 beats with rate range 136-188 bpm, average 176 bpm, 2.2 seconds.  Longest 11 beats, rate range 77-133 bpm, average 113 bpm, 6.3 seconds.   No Sustained Arrhythmias: Atrial Tachycardia (AT), Supraventricular Tachycardia (SVT), Atrial Fibrillation (A-Fib), Atrial Flutter (A-Flutter), Sustained Ventricular Tachycardia (VT)   Symptoms noted with sinus rhythm and PVCs   Overall relatively normal study.  No significant arrhythmias.  She may be feeling the short bursts of atrial runs, although the monitor did not suggest this.  EKG:  EKG  ordered today.   EKG Interpretation Date/Time:  Tuesday November 08 2022 15:21:11 EDT Ventricular Rate:  63 PR Interval:  152 QRS Duration:  106 QT Interval:  424 QTC Calculation: 433 R Axis:   -20  Text Interpretation: Normal sinus rhythm Possible Left atrial enlargement Incomplete right bundle branch block No significant changes compared to prior tracings Confirmed by Marjie Skiff [phone]) on 11/08/2022 8:44:07 PM    Recent Labs: 06/22/2022: BUN 22; Creatinine, Ser 1.15; Hemoglobin 12.1; Platelets 171; Potassium 4.4; Sodium 133  Recent Lipid Panel    Component Value Date/Time   CHOL 214 (H) 10/20/2021  0101   TRIG 20 10/20/2021 0101   HDL 81 10/20/2021 0101   CHOLHDL 2.6 10/20/2021 0101   VLDL 4 10/20/2021 0101   LDLCALC 129 (H) 10/20/2021 0101    Physical Exam:    Vital Signs: BP (!) 115/50 (BP Location: Left Arm, Patient Position: Sitting, Cuff Size: Normal)   Pulse 73   Ht 5\' 3"  (1.6 m)   Wt 120 lb (54.4 kg)   SpO2 98%   BMI 21.26 kg/m     Wt Readings from Last 3 Encounters:  11/08/22 120 lb (54.4 kg)  11/07/22 124 lb (56.2 kg)  07/27/22 124 lb (56.2 kg)     General: 89 y.o. Caucasian female in no acute distress. HEENT: Normocephalic and atraumatic. Sclera clear.  Neck: Supple. No JVD. Heart:  RRR with occasional ectopy. III/VI systolic murmur heard throughout (loudest at right upper sternal border and apex).   Lungs: No increased work of breathing. Clear to ausculation bilaterally. No wheezes, rhonchi, or rales.  Abdomen: Soft, non-distended, and non-tender to palpation.  Extremities: Mild lower extremity edema bilateral (left chronically large than right). Skin: Warm and dry. Neuro: No focal deficits. Psych: Normal affect. Responds appropriately.   Assessment:    1. Atypical chest pain   2. Coronary artery disease involving native coronary artery of native heart without angina pectoris   3. Dyspnea on exertion   4. Primary hypertension   5. Dysautonomia orthostatic hypotension syndrome   6. Palpitations   7. Moderate aortic stenosis   8. Mild aortic regurgitation   9. Mild mitral regurgitation   10. Mild mitral stenosis   11. Stage 3a chronic kidney disease (HCC)     Plan:    Chest Pain Non-Obstructive CAD Coronary CTA in 11/2021 showed a coronary calcium score of 255 (73rd percentile for age and sex) and mild mixed non-obstructive CAD. - Patient reports some intermittent atypical sharp chest pain that occurs at rest and last for <1 minute. No exertional chest pain. - EKG today shows no acute ischemic changes.  - She states she is no longer taking  Aspirin. Recommended restarting Aspirin 81mg  daily. She denies having any abnormal bleeding with this in the past.  - She has been intolerant to statins in the past. - Chest pain sounds atypical. I don't think any additional ischemic evaluation is necessary at this time given advanced age, no exertional chest pain, and coronary CTA <1 year ago that showed only mild disease. Advised patient to let us know if she has any worsening symptoms.   Dyspnea on Exertion Patient states she has had dyspnea on exertion every since she had COVID in 2021. This has been waxing and waning. She saw Pulmonology yesterday who felt symptoms were likely "multifactorial with cardiac contributors as primary driver." Echo in 02/9560 showed LVEF of 65-70% with normal wall motion, mild LVH, and grade 1 diastolic dysfunction as well as valvular disease as described below.  - She has some mild lower extremity edema on exam but otherwise appears euvolemic. Weight down 4lbs since  office visit in 07/2022.  - Will check BNP and BMET.  - Continue inhalers per Pulmonology..   Hypertension Dysautonomia Orthostatic Hypotension Patient has a history of hypertension complicated by dysautonomia orthostatic hypotension which actually led to a syncopal episode in 06/2022 where she sustained a right wrist fracture and multiple facial fractures. She was started on Midodrine at last visit in 07/2022.  - BP stable at 115/50. - She reports occasional lightheadedness/ dizziness that occur after she gets acutely short of breath. However, she denies any recurrent syncope.  - Continue Toprol-XL 25mg  daily for palpitations.  - Continue Midodrine 2.5mg  in the morning.  - Continue conservative measures like staying well hydrated, wearing compression stockings, elevating legs as much as possible, changing positions slowsly.   Palpitations Last monitor in 06/2022 after syncopal episode showed nderling sinus rhythm with 21 atrial runs (longet episode last  11 beats), 2 short burts of NSVT (longest run 8 beats), and occasional PVCs (2.2% burden) but no significant arrhythmias.  - Stable.  - Continue Toprol-XL 25mg  daily.   Moderate Aortic Stenosis Mild Aortic Regurgitation Mild Mitral Regurgitation/ Stenosis Noted on Echo in 06/2022.  - I think we can likely hold off on routine serial Echos at this point (unless she has worsening symptoms) given advanced age. She states she would most likely not want to have surgery anyway.   CKD Stage IIIa Baseline creatinine around 1.1 to 1.2. Stable at 1.12 in 09/2022   Disposition: Follow up in 3 months.    Signed, Corrin Parker, PA-C  11/08/2022 9:08 PM    [facility]

## 2022-11-01 ENCOUNTER — Encounter (INDEPENDENT_AMBULATORY_CARE_PROVIDER_SITE_OTHER): Payer: Medicare PPO | Admitting: Ophthalmology

## 2022-11-01 DIAGNOSIS — H43813 Vitreous degeneration, bilateral: Secondary | ICD-10-CM | POA: Diagnosis not present

## 2022-11-01 DIAGNOSIS — I10 Essential (primary) hypertension: Secondary | ICD-10-CM | POA: Diagnosis not present

## 2022-11-01 DIAGNOSIS — H35033 Hypertensive retinopathy, bilateral: Secondary | ICD-10-CM | POA: Diagnosis not present

## 2022-11-01 DIAGNOSIS — H353231 Exudative age-related macular degeneration, bilateral, with active choroidal neovascularization: Secondary | ICD-10-CM | POA: Diagnosis not present

## 2022-11-07 ENCOUNTER — Encounter: Payer: Self-pay | Admitting: Pulmonary Disease

## 2022-11-07 ENCOUNTER — Ambulatory Visit: Payer: Medicare PPO | Admitting: Pulmonary Disease

## 2022-11-07 VITALS — BP 134/62 | HR 102 | Temp 97.6°F | Ht 63.0 in | Wt 124.0 lb

## 2022-11-07 DIAGNOSIS — R0609 Other forms of dyspnea: Secondary | ICD-10-CM | POA: Diagnosis not present

## 2022-11-07 MED ORDER — ALBUTEROL SULFATE HFA 108 (90 BASE) MCG/ACT IN AERS: 2.0000 | INHALATION_SPRAY | RESPIRATORY_TRACT | 11 refills | Status: AC | PRN

## 2022-11-07 NOTE — Patient Instructions (Addendum)
Nice to meet you  No changes to medication   Use Spiriva 2 puff once a day  Use albuterol as needed  Rertun to clinic 6 months or sooner as needed

## 2022-11-07 NOTE — Progress Notes (Signed)
Synopsis: Referred for DOE by Thana Ates, MD  Subjective:   PATIENT ID: [name] GENDER: female DOB: November 20, 1927, MRN: [id number]  No chief complaint on file.  CC: dyspnea, chest tightness  89 y.o. history of DVT, TIA, CKD3, mod AS (worsening over time on serial TTE), AI, MVR, mitral stenosis, diastolic dysfunction  Returns for routine follow-up.  New visit with me today.  Previously patient Dr. Thora Lance.  His notes x 3 reviewed.  DC summary 06/2022 reviewed.  Admitted for syncope.  Echocardiogram that time showed worsening aortic stenosis now moderate.  With mitral valve regurgitation mitral stenosis as well as aortic valve insufficiency.  Grade 1 diastolic dysfunction.  She is very short of breath.  Minimal exertion.  Most recent chest x-ray 06/2022 clear.  Limited views of the lung cross-sectional imaging 11/2021 clear.  No clear pulmonary limitation based on objective findings.  HPI at initial visit: Recent admission for CP, dypsnea, presyncope vs vertigo 10/22/21 found to have severe esophageal dysmotility  Seen by cardiology 11/6 and echo (mod AS), zio patch (overall pretty low burden of SVT), coronary CTA (mild nCAD), vestibular rehab ordreed  Saw GI 12/13 started on H2B, TLC measures discussed  Short of breath over the past year with minimal exertion. Mentions recurrent episodes of presyncope that happen when she gets suddenly dyspneic. This has all been since she had covid-19 2021. She does think it's quite helpful when she uses albuterol.  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 She never smoked, no vaping, MJ.   Interval HPI  Increased dose of spiriva last visit though she felt 'funny' on it.   Had a bad day yesterday. Cooked dinner and had to quit and lie down, felt short of breath, dizzy like she might pass out. She tried the spiriva inhaler and it helped some.   Otherwise pertinent review  of systems is negative.  Past Medical History:  Diagnosis Date   Abnormal Pap smear 1975-76   Acute deep vein thrombosis (DVT) of left peroneal vein (HCC)    Anemia    history of   Arthritis    spine   Breast cyst, left 1980   Cystocele 2012   Diverticulosis    with h/o Diverticulitis   DVT, lower extremity, distal, chronic (HCC) 12/24/2018   GERD (gastroesophageal reflux disease)    H/O hypercholesterolemia    H/O osteopenia    H/O varicella    H/O varicose veins    Hx of Mumps    Macular degeneration    Seasonal allergies    TIA (transient ischemic attack)    Urge incontinence 2012   Urinary frequency 2010     Family History  Problem Relation Age of Onset   Uterine cancer Mother 6   Heart attack Brother 67   Breast cancer Daughter    Stroke Neg Hx      Past Surgical History:  Procedure Laterality Date   14-Day Zio Patch  12/2021   Predominantly sinus rhythm open and 47-1:15 PM, average 70 bpm (1  AVB); rare isolated PACs PVCs.  55 atrial runs 4-28 beats fastest heart rate ranges 169 to 176 bpm.  Episodes lasting 1.8 to 15 seconds.  No sustained arrhythmias.  Symptoms not noted with atrial runs.   ABDOMINAL HYSTERECTOMY     BLADDER SUSPENSION N/A 06/16/2016   Procedure: TRANSVAGINAL TAPE (TVT) PROCEDURE;  Surgeon: Osborn Coho, MD;  Location: [facility];  Service: Gynecology;  Laterality: N/A;   BREAST CYST ASPIRATION  1964   COLONOSCOPY     Dr. Kinnie Scales   CT CTA CORONARY W/CA SCORE W/CM &/OR WO/CM  11/2021   1. Mild mixed non-obstructive CAD, CADRADS = 2.   2. Coronary calcium score of 255. This was 73rd percentile for age and sex matched control (based on an 89 yo).  3. Normal coronary origin with right dominance.   4. Aortic and mitral annular calcification.  5. Aortic atherosclerosis   CYSTOCELE REPAIR N/A 06/16/2016   Procedure: ANTERIOR REPAIR (CYSTOCELE);  Surgeon: Osborn Coho, MD;  Location: [facility];  Service: Gynecology;  Laterality: N/A;   CYSTOSCOPY N/A  06/16/2016   Procedure: CYSTOSCOPY;  Surgeon: Osborn Coho, MD;  Location: [facility];  Service: Gynecology;  Laterality: N/A;  see anterior repair   Lower Extremity Venous Dopplers  01/09/2012   Right and left lower steroids: No evidence of thrombus or, thrombophlebitis; right and left GSV and SSV: No venous insufficiency. Normal exam.   NM MYOVIEW LTD  11/2016   6.6 METS.  Reached 106% of max.  Heart rate.  Walk for 4:40 min.  EF 72%.  Normal blood pressure response.  upsloping ST segment depression, nonspecific.  Otherwise normal study.  LOW RISK.  No evidence of ischemia or infarction.   TRANSTHORACIC ECHOCARDIOGRAM  11/2021   Normal LV Size & Fxn - EF 60-65%. No RWMA. ~ Diastolic Fxn. Degenerative MV - Mod MAC. ~ Mod AS    Social History   Socioeconomic History   Marital status: Married    Spouse name: Not on file   Number of children: 3   Years of education: Not on file   Highest education level: Master's degree (e.g., MA, MS, MEng, MEd, MSW, MBA)  Occupational History    Comment: retired Runner, broadcasting/film/video, principal x 21 years  Tobacco Use   Smoking status: Never    Passive exposure: Never   Smokeless tobacco: Never  Vaping Use   Vaping status: Never Used  Substance and Sexual Activity   Alcohol use: No   Drug use: No   Sexual activity: Yes    Birth control/protection: Surgical    Comment: Hysterectomy  Other Topics Concern   Not on file  Social History Narrative   She is a married mother of 3, grandmother of 6, great grandmother of 1. She walks daily about 2 miles a day, does not smoke, does not drink.    Lives with huband   Social Determinants of Health   Financial Resource Strain: Not on file  Food Insecurity: Low Risk  (07/25/2022)   [facility]   Hunger Vital Sign    Worried About Running Out of Food in the Last Year: Never true    Ran Out of Food in the Last Year: Never true  Transportation Needs: Not on file (07/25/2022)  Physical Activity:  Not on file  Stress: Not on file  Social Connections: Not on file  Intimate Partner Violence: Not on file     Allergies  Allergen Reactions   Atorvastatin Other (See Comments)    Unknown reaction   Desmopressin Acetate Other (See Comments)   Iodinated Contrast Media Hives and Other (See Comments)   Myrbetriq [Mirabegron] Other (See Comments)    "Makes me urinate more frequently"   Iodine Rash     Outpatient Medications Prior to Visit  Medication Sig Dispense Refill   acetaminophen (TYLENOL) 500 MG tablet  Take 500-1,000 mg by mouth every 8 (eight) hours as needed for mild pain.      albuterol (VENTOLIN HFA) 108 (90 Base) MCG/ACT inhaler Inhale 2 puffs into the lungs every 4 (four) hours as needed for wheezing or shortness of breath.      alendronate (FOSAMAX) 70 MG tablet Take 1 tablet by mouth once a week.     aspirin EC 81 MG tablet Take 1 tablet (81 mg total) by mouth every evening. Swallow whole. 30 tablet 11   Besifloxacin HCl (BESIVANCE) 0.6 % SUSP PLACE 1 DROP IN RIGHT EYE 4 TIMES DAILY FOR 2 DAYS     Cholecalciferol (VITAMIN D-3) 25 MCG (1000 UT) CAPS Take 1,000 Units by mouth daily with breakfast.      clobetasol (TEMOVATE) 0.05 % external solution Apply 1 Application topically as needed.     famotidine (PEPCID) 40 MG tablet Take 40 mg by mouth daily. (Patient not taking: Reported on 07/27/2022)     HYDROcodone-acetaminophen (NORCO/VICODIN) 5-325 MG tablet Take 1 tablet by mouth every 6 (six) hours as needed for severe pain. (Patient not taking: Reported on 07/27/2022) 30 tablet 0   Lactobacillus Rhamnosus, GG, (CULTURELLE) CAPS Take 1 capsule by mouth every morning.      Magnesium 250 MG TABS Take 250 mg by mouth daily as needed (leg cramps).     metoprolol succinate (TOPROL-XL) 25 MG 24 hr tablet Take 1 tablet (25 mg total) by mouth every evening. Kapspargo sprinkle 90 tablet 3   midodrine (PROAMATINE) 2.5 MG tablet Take 1 tablet (2.5 mg total) by mouth daily after breakfast.  90 tablet 3   mirabegron ER (MYRBETRIQ) 50 MG TB24 tablet Take 1 tablet by mouth at bedtime.     Multiple Vitamins-Minerals (AIRBORNE PO) Take 1 tablet by mouth daily with breakfast.     Multiple Vitamins-Minerals (CENTRUM SILVER ULTRA WOMENS) TABS Take 1 tablet by mouth daily with breakfast.      Multiple Vitamins-Minerals (ICAPS AREDS 2 PO) Take 1 capsule by mouth daily at 12 noon.     Nutritional Supplements (,FEEDING SUPPLEMENT, PROSOURCE PLUS) liquid Take 30 mLs by mouth 2 (two) times daily between meals. 887 mL 0   polyethylene glycol (MIRALAX / GLYCOLAX) 17 g packet Take 17 g by mouth daily as needed. 14 each 0   Polyvinyl Alcohol-Povidone (REFRESH OP) Place 1 drop into both eyes daily as needed (dry eyes).     protein supplement shake (PREMIER PROTEIN) LIQD Take 59.1 mLs (2 oz total) by mouth 3 (three) times daily between meals. (Patient taking differently: Take 2 oz by mouth 3 (three) times daily between meals. BOOST) 10000 mL 0   senna-docusate (SENOKOT-S) 8.6-50 MG tablet Take 2 tablets by mouth at bedtime as needed for mild constipation. 30 tablet 0   Tiotropium Bromide Monohydrate (SPIRIVA RESPIMAT) 1.25 MCG/ACT AERS Inhale 2 puffs into the lungs daily. 1 each 11   Trospium Chloride 60 MG CP24 Take 1 capsule by mouth daily.     vitamin B-12 (CYANOCOBALAMIN) 100 MCG tablet Take 100 mcg by mouth daily.     No facility-administered medications prior to visit.       Objective:   Physical Exam:  General appearance: 89 y.o., female, NAD, conversant  Eyes: anicteric sclerae; PERRL, tracking appropriately HENT: NCAT; MMM Neck: Trachea midline; no lymphadenopathy, no JVD Lungs: CTAB, no crackles, no wheeze, with normal respiratory effort CV: RRR, + systolic murmur Abdomen: Soft, non-tender; non-distended, BS present  Extremities: No peripheral edema, warm Skin:  Normal turgor and texture; no rash Psych: Appropriate affect Neuro: Alert and oriented to person and place, no focal  deficit     There were no vitals filed for this visit.       on RA BMI Readings from Last 3 Encounters:  07/27/22 21.97 kg/m  06/24/22 23.16 kg/m  05/09/22 22.21 kg/m   Wt Readings from Last 3 Encounters:  07/27/22 124 lb (56.2 kg)  06/24/22 130 lb 11.7 oz (59.3 kg)  05/09/22 125 lb 6.4 oz (56.9 kg)     CBC    Component Value Date/Time   WBC 8.1 06/22/2022 2343   RBC 3.99 06/22/2022 2343   HGB 12.1 06/22/2022 2343   HCT 33.4 (L) 06/22/2022 2343   PLT 171 06/22/2022 2343   MCV 83.7 06/22/2022 2343   MCV 87.1 03/25/2015 1405   MCH 30.3 06/22/2022 2343   MCHC 36.2 (H) 06/22/2022 2343   RDW 13.4 06/22/2022 2343   LYMPHSABS 4.4 (H) 10/22/2021 0046   MONOABS 0.7 10/22/2021 0046   EOSABS 0.1 10/22/2021 0046   BASOSABS 0.0 10/22/2021 0046     Chest Imaging: CT Coronaries 12/13/21 a few foci of scar at bases  Barium swallow 10/21/21: 1.  Severe esophageal dysmotility.   2. Pulsion diverticulum of lower esophagus just above gastroesophageal junction.   3.  Inability to swallow pill beyond the pharynx.  Pulmonary Functions Testing Results:     No data to display            Echocardiogram 12/15/21:    1. Left ventricular ejection fraction, by estimation, is 60 to 65%. The  left ventricle has normal function. The left ventricle has no regional  wall motion abnormalities. There is mild left ventricular hypertrophy.  Left ventricular diastolic parameters  are indeterminate.   2. Right ventricular systolic function is normal. The right ventricular  size is normal.   3. The mitral valve is degenerative. Mild mitral valve regurgitation. No  evidence of mitral stenosis. Moderate mitral annular calcification.   4. Gradients slightly higher than seen on TTE 04/08/21 . The aortic valve  is normal in structure. Aortic valve regurgitation is mild. Moderate  aortic valve stenosis.   5. The inferior vena cava is normal in size with greater than 50%  respiratory  variability, suggesting right atrial pressure of 3 mmHg.       Assessment & Plan:   # DOE  # Recurrent chest tightness/pressure, presyncope Tough to piece together her constellation of symptoms precisely.  Seems multifactorial with cardiac contributors as primary driver especially with recent syncope, with grade 1 diastolic function, AI, MVR, mitral stenosis, worsening now moderate aortic stenosis on serial echocardiograms.  No true objective evidence of long as primary driver.  States Spiriva and albuterol helps him.  Reviewed inhaler technique technique today in the room.  Direct visualization.  She is clearly not using inhalers correctly.  Offered nebulized therapy which she declines.  Suspect she is really not getting any inhaled medicines based on visualized use of inhalers.  Plan: -Continue low-dose spiriva 2 puffs everyday given therapeutic benefit per patient report -Serial efforts at inhaler education - continue albuterol 1-2 puffs as needed up to 4 times a day as a RESCUE inhaler only -Ongoing cardiology follow-up    Karren Burly, MD [facility] 11/07/2022 2:18 PM   I spent 41 minutes in the care of the patient occluding review of records, face-to-face visit, coordination of care.

## 2022-11-08 ENCOUNTER — Encounter: Payer: Self-pay | Admitting: Student

## 2022-11-08 ENCOUNTER — Ambulatory Visit: Payer: Medicare PPO | Attending: Adult Health | Admitting: Student

## 2022-11-08 VITALS — BP 115/50 | HR 73 | Ht 63.0 in | Wt 120.0 lb

## 2022-11-08 DIAGNOSIS — I340 Nonrheumatic mitral (valve) insufficiency: Secondary | ICD-10-CM | POA: Diagnosis not present

## 2022-11-08 DIAGNOSIS — I951 Orthostatic hypotension: Secondary | ICD-10-CM

## 2022-11-08 DIAGNOSIS — R002 Palpitations: Secondary | ICD-10-CM

## 2022-11-08 DIAGNOSIS — I2510 Atherosclerotic heart disease of native coronary artery without angina pectoris: Secondary | ICD-10-CM

## 2022-11-08 DIAGNOSIS — I350 Nonrheumatic aortic (valve) stenosis: Secondary | ICD-10-CM | POA: Diagnosis not present

## 2022-11-08 DIAGNOSIS — R0609 Other forms of dyspnea: Secondary | ICD-10-CM

## 2022-11-08 DIAGNOSIS — R0789 Other chest pain: Secondary | ICD-10-CM

## 2022-11-08 DIAGNOSIS — N1831 Chronic kidney disease, stage 3a: Secondary | ICD-10-CM | POA: Diagnosis not present

## 2022-11-08 DIAGNOSIS — I351 Nonrheumatic aortic (valve) insufficiency: Secondary | ICD-10-CM

## 2022-11-08 DIAGNOSIS — I10 Essential (primary) hypertension: Secondary | ICD-10-CM | POA: Diagnosis not present

## 2022-11-08 DIAGNOSIS — I050 Rheumatic mitral stenosis: Secondary | ICD-10-CM | POA: Diagnosis not present

## 2022-11-08 NOTE — Patient Instructions (Signed)
Medication Instructions:  RESTART YOUR ASPIRIN 81MG  DAILY *If you need a refill on your cardiac medications before your next appointment, please call your pharmacy*  Lab Work: BNP AND BMET If you have labs (blood work) drawn today and your tests are completely normal, you will receive your results only by:    MyChart Message (if you have MyChart) OR   A paper copy in the mail If you have any lab test that is abnormal or we need to change your treatment, we will call you to review the results.  Follow-Up: At [facility], you and your health needs are our priority.  As part of our continuing mission to provide you with exceptional heart care, we have created designated Provider Care Teams.  These Care Teams include your primary Cardiologist (physician) and Advanced Practice Providers (APPs -  Physician Assistants and Nurse Practitioners) who all work together to provide you with the care you need, when you need it.  We recommend signing up for the patient portal called "MyChart".  Sign up information is provided on this After Visit Summary.  MyChart is used to connect with patients for Virtual Visits (Telemedicine).  Patients are able to view lab/test results, encounter notes, upcoming appointments, etc.  Non-urgent messages can be sent to your provider as well.   To learn more about what you can do with MyChart, go to [email or web address].    Your next appointment:   3 month(s)  Provider:   Bryan Lemma, MD  or Marjie Skiff, PA-C        Other Instructions

## 2022-11-09 LAB — BASIC METABOLIC PANEL
BUN/Creatinine Ratio: 19 (ref 12–28)
BUN: 23 mg/dL (ref 10–36)
CO2: 25 mmol/L (ref 20–29)
Calcium: 9.4 mg/dL (ref 8.7–10.3)
Chloride: 102 mmol/L (ref 96–106)
Creatinine, Ser: 1.18 mg/dL — ABNORMAL HIGH (ref 0.57–1.00)
Glucose: 94 mg/dL (ref 70–99)
Potassium: 4.7 mmol/L (ref 3.5–5.2)
Sodium: 140 mmol/L (ref 134–144)
eGFR: 43 mL/min/{1.73_m2} — ABNORMAL LOW (ref >59)

## 2022-11-09 LAB — BRAIN NATRIURETIC PEPTIDE: BNP: 338.2 pg/mL — ABNORMAL HIGH (ref 0.0–100.0)

## 2022-11-11 ENCOUNTER — Other Ambulatory Visit: Payer: Self-pay

## 2022-11-11 MED ORDER — FUROSEMIDE 20 MG PO TABS: 20.0000 mg | ORAL_TABLET | Freq: Every day | ORAL | 1 refills | Status: DC

## 2022-11-14 ENCOUNTER — Telehealth: Payer: Self-pay | Admitting: Student

## 2022-11-14 NOTE — Telephone Encounter (Signed)
  Pt c/o medication issue:  1. Name of Medication:   furosemide (LASIX) 20 MG tablet    2. How are you currently taking this medication (dosage and times per day)?   Take 1 tablet (20 mg total) by mouth daily. Lasix 20mg  x 3 days; then she can take as needed for weight gain (3lbs in 1 day or 5lbs in 1 week) or worsening lower extremity edema.    3. Are you having a reaction (difficulty breathing--STAT)? no  4. What is your medication issue? Pt said, she's been feeling sick since she started taking this medication.  She would like to ask Callie if he wants to change it. She did not take this medication today

## 2022-11-14 NOTE — Telephone Encounter (Signed)
Weight: 120lbs BP: 136/70 HR: 74 O2: 99  Breathing today is "not so good" -- breathing heavy while on the phone. She has used inhaler which she reports helps. She reports exertion worsens her breathing.   Advised to monitor daily weights -- call if gains 3lbs in 24 hours or 5lbs in a week OR if breathing worsens, inhaler does not help. Advised to stop lasix for now as directed by [facility] PA

## 2022-11-14 NOTE — Telephone Encounter (Signed)
She can stop the Lasix. How is her breathing today? She has a  history of orthostatic hypotension so I am just trying to be careful with diuretics. As long as her breathing and weights are stable, we can continue to monitor for now. If she is still having a lot of shortness of breath or is gaining weight, we may need to increase her Midodrine so that we can hopefully add a diuretic.   Thank you!

## 2022-11-14 NOTE — Telephone Encounter (Signed)
Spoke with patient of [place] PA. She reports she took lasix yesterday for the 1st time but it made her feel so sick - she felt so out of it, stayed in bed all day. She reports she couldn't breathe, head was hurting, almost passed out. She did not check her BP or pulse. She didn't eat yesterday, drank lots of water. Urinated often. Felt better by the time is was bedtime. She reports feeling better today but residual weakness.   Lasix x3 days then PRN was rx'ed based on elevated BNP  Asked that check weight, BP, and pulse so when we speak again with recommendations she can provide this update.   Routed to [place] PA

## 2022-11-27 NOTE — Telephone Encounter (Signed)
Marcelino Duster, can you please call and check on her to see how her breathing is doing and if her weights are stable?  Thank you!

## 2022-11-28 ENCOUNTER — Other Ambulatory Visit: Payer: Self-pay

## 2022-11-28 ENCOUNTER — Emergency Department (HOSPITAL_COMMUNITY): Payer: Medicare PPO

## 2022-11-28 ENCOUNTER — Encounter (HOSPITAL_COMMUNITY): Payer: Self-pay

## 2022-11-28 ENCOUNTER — Emergency Department (HOSPITAL_COMMUNITY)
Admission: EM | Admit: 2022-11-28 | Discharge: 2022-11-29 | Discharge status: Against Medical Advice | Payer: Medicare PPO | Attending: Emergency Medicine | Admitting: Emergency Medicine

## 2022-11-28 DIAGNOSIS — R079 Chest pain, unspecified: Secondary | ICD-10-CM | POA: Diagnosis not present

## 2022-11-28 DIAGNOSIS — Z5321 Procedure and treatment not carried out due to patient leaving prior to being seen by health care provider: Secondary | ICD-10-CM | POA: Diagnosis not present

## 2022-11-28 DIAGNOSIS — I4510 Unspecified right bundle-branch block: Secondary | ICD-10-CM | POA: Diagnosis not present

## 2022-11-28 DIAGNOSIS — M79603 Pain in arm, unspecified: Secondary | ICD-10-CM | POA: Diagnosis not present

## 2022-11-28 DIAGNOSIS — R55 Syncope and collapse: Secondary | ICD-10-CM | POA: Diagnosis not present

## 2022-11-28 DIAGNOSIS — R0789 Other chest pain: Secondary | ICD-10-CM | POA: Diagnosis not present

## 2022-11-28 DIAGNOSIS — I700 Atherosclerosis of aorta: Secondary | ICD-10-CM | POA: Diagnosis not present

## 2022-11-28 DIAGNOSIS — R0902 Hypoxemia: Secondary | ICD-10-CM | POA: Diagnosis not present

## 2022-11-28 LAB — BASIC METABOLIC PANEL
Anion gap: 8 (ref 5–15)
BUN: 26 mg/dL — ABNORMAL HIGH (ref 8–23)
CO2: 19 mmol/L — ABNORMAL LOW (ref 22–32)
Calcium: 9.2 mg/dL (ref 8.9–10.3)
Chloride: 107 mmol/L (ref 98–111)
Creatinine, Ser: 1.42 mg/dL — ABNORMAL HIGH (ref 0.44–1.00)
GFR, Estimated: 34 mL/min — ABNORMAL LOW (ref >60)
Glucose, Bld: 155 mg/dL — ABNORMAL HIGH (ref 70–99)
Potassium: 4.1 mmol/L (ref 3.5–5.1)
Sodium: 134 mmol/L — ABNORMAL LOW (ref 135–145)

## 2022-11-28 LAB — CBC
HCT: 29.5 % — ABNORMAL LOW (ref 36.0–46.0)
Hemoglobin: 10.6 g/dL — ABNORMAL LOW (ref 12.0–15.0)
MCH: 29.7 pg (ref 26.0–34.0)
MCHC: 35.9 g/dL (ref 30.0–36.0)
MCV: 82.6 fL (ref 80.0–100.0)
Platelets: 157 10*3/uL (ref 150–400)
RBC: 3.57 MIL/uL — ABNORMAL LOW (ref 3.87–5.11)
RDW: 14 % (ref 11.5–15.5)
WBC: 6.6 10*3/uL (ref 4.0–10.5)
nRBC: 0 % (ref 0.0–0.2)

## 2022-11-28 LAB — TROPONIN I (HIGH SENSITIVITY): Troponin I (High Sensitivity): 12 ng/L (ref <18)

## 2022-11-28 NOTE — Telephone Encounter (Signed)
Thank you :)

## 2022-11-28 NOTE — Telephone Encounter (Signed)
Pt states that weight down and SOB is a little better per pt. She states that her weight today 120# LE swelling is down per pt.

## 2022-11-28 NOTE — ED Triage Notes (Signed)
Chest pain starting ago, was doing dishes felt faint, pain radiates to left arm but does have a previous injury to the left shoulder. She had a similar episode in June and was admitted for it. Pt did not initially want to come but is coming in for peace of mind  20g left forearm 324asa .4 nitroglycerin x 1 SL dose  Medic vitals   118/60 80hr 100%ra 25rr 207bgl

## 2022-11-29 DIAGNOSIS — R079 Chest pain, unspecified: Secondary | ICD-10-CM | POA: Diagnosis not present

## 2022-11-29 NOTE — ED Notes (Signed)
Pt called for repeat vitals x3 no answer

## 2022-11-30 DIAGNOSIS — M25512 Pain in left shoulder: Secondary | ICD-10-CM | POA: Diagnosis not present

## 2022-12-06 ENCOUNTER — Encounter (INDEPENDENT_AMBULATORY_CARE_PROVIDER_SITE_OTHER): Payer: Medicare PPO | Admitting: Ophthalmology

## 2022-12-06 DIAGNOSIS — H35033 Hypertensive retinopathy, bilateral: Secondary | ICD-10-CM | POA: Diagnosis not present

## 2022-12-06 DIAGNOSIS — H43813 Vitreous degeneration, bilateral: Secondary | ICD-10-CM

## 2022-12-06 DIAGNOSIS — H353231 Exudative age-related macular degeneration, bilateral, with active choroidal neovascularization: Secondary | ICD-10-CM | POA: Diagnosis not present

## 2022-12-06 DIAGNOSIS — I10 Essential (primary) hypertension: Secondary | ICD-10-CM

## 2022-12-07 DIAGNOSIS — N1831 Chronic kidney disease, stage 3a: Secondary | ICD-10-CM | POA: Diagnosis not present

## 2022-12-07 DIAGNOSIS — Z9181 History of falling: Secondary | ICD-10-CM | POA: Diagnosis not present

## 2022-12-07 DIAGNOSIS — I5030 Unspecified diastolic (congestive) heart failure: Secondary | ICD-10-CM | POA: Diagnosis not present

## 2022-12-07 DIAGNOSIS — Z1331 Encounter for screening for depression: Secondary | ICD-10-CM | POA: Diagnosis not present

## 2022-12-07 DIAGNOSIS — Z8673 Personal history of transient ischemic attack (TIA), and cerebral infarction without residual deficits: Secondary | ICD-10-CM | POA: Diagnosis not present

## 2022-12-07 DIAGNOSIS — D649 Anemia, unspecified: Secondary | ICD-10-CM | POA: Diagnosis not present

## 2022-12-07 DIAGNOSIS — Z0000 Encounter for general adult medical examination without abnormal findings: Secondary | ICD-10-CM | POA: Diagnosis not present

## 2022-12-07 DIAGNOSIS — R1319 Other dysphagia: Secondary | ICD-10-CM | POA: Diagnosis not present

## 2022-12-07 DIAGNOSIS — R7301 Impaired fasting glucose: Secondary | ICD-10-CM | POA: Diagnosis not present

## 2022-12-07 DIAGNOSIS — M810 Age-related osteoporosis without current pathological fracture: Secondary | ICD-10-CM | POA: Diagnosis not present

## 2022-12-07 DIAGNOSIS — N3281 Overactive bladder: Secondary | ICD-10-CM | POA: Diagnosis not present

## 2023-01-03 ENCOUNTER — Encounter (INDEPENDENT_AMBULATORY_CARE_PROVIDER_SITE_OTHER): Payer: Medicare PPO | Admitting: Ophthalmology

## 2023-01-04 ENCOUNTER — Encounter (INDEPENDENT_AMBULATORY_CARE_PROVIDER_SITE_OTHER): Payer: Medicare PPO | Admitting: Ophthalmology

## 2023-01-04 DIAGNOSIS — H35033 Hypertensive retinopathy, bilateral: Secondary | ICD-10-CM

## 2023-01-04 DIAGNOSIS — I10 Essential (primary) hypertension: Secondary | ICD-10-CM

## 2023-01-04 DIAGNOSIS — H353231 Exudative age-related macular degeneration, bilateral, with active choroidal neovascularization: Secondary | ICD-10-CM | POA: Diagnosis not present

## 2023-01-04 DIAGNOSIS — H43813 Vitreous degeneration, bilateral: Secondary | ICD-10-CM

## 2023-01-17 ENCOUNTER — Emergency Department (HOSPITAL_BASED_OUTPATIENT_CLINIC_OR_DEPARTMENT_OTHER): Payer: Medicare PPO

## 2023-01-17 ENCOUNTER — Encounter: Payer: Self-pay | Admitting: Cardiology

## 2023-01-17 ENCOUNTER — Emergency Department (HOSPITAL_BASED_OUTPATIENT_CLINIC_OR_DEPARTMENT_OTHER)
Admission: EM | Admit: 2023-01-17 | Discharge: 2023-01-17 | Disposition: A | Discharge status: Home / Self Care | Payer: Medicare PPO | Attending: Emergency Medicine | Admitting: Emergency Medicine

## 2023-01-17 ENCOUNTER — Other Ambulatory Visit: Payer: Self-pay

## 2023-01-17 ENCOUNTER — Ambulatory Visit: Payer: Medicare PPO | Attending: Cardiology | Admitting: Cardiology

## 2023-01-17 ENCOUNTER — Encounter (HOSPITAL_BASED_OUTPATIENT_CLINIC_OR_DEPARTMENT_OTHER): Payer: Self-pay

## 2023-01-17 ENCOUNTER — Telehealth: Payer: Self-pay | Admitting: Cardiology

## 2023-01-17 VITALS — BP 113/64 | HR 87 | Ht 65.0 in | Wt 118.0 lb

## 2023-01-17 DIAGNOSIS — I10 Essential (primary) hypertension: Secondary | ICD-10-CM | POA: Diagnosis not present

## 2023-01-17 DIAGNOSIS — I2510 Atherosclerotic heart disease of native coronary artery without angina pectoris: Secondary | ICD-10-CM | POA: Diagnosis not present

## 2023-01-17 DIAGNOSIS — R0602 Shortness of breath: Secondary | ICD-10-CM | POA: Diagnosis not present

## 2023-01-17 DIAGNOSIS — R002 Palpitations: Secondary | ICD-10-CM | POA: Diagnosis not present

## 2023-01-17 DIAGNOSIS — R42 Dizziness and giddiness: Secondary | ICD-10-CM

## 2023-01-17 DIAGNOSIS — I340 Nonrheumatic mitral (valve) insufficiency: Secondary | ICD-10-CM | POA: Diagnosis not present

## 2023-01-17 DIAGNOSIS — I700 Atherosclerosis of aorta: Secondary | ICD-10-CM | POA: Diagnosis not present

## 2023-01-17 DIAGNOSIS — I951 Orthostatic hypotension: Secondary | ICD-10-CM | POA: Diagnosis not present

## 2023-01-17 DIAGNOSIS — N1831 Chronic kidney disease, stage 3a: Secondary | ICD-10-CM

## 2023-01-17 DIAGNOSIS — I350 Nonrheumatic aortic (valve) stenosis: Secondary | ICD-10-CM | POA: Diagnosis not present

## 2023-01-17 DIAGNOSIS — Z8616 Personal history of COVID-19: Secondary | ICD-10-CM | POA: Insufficient documentation

## 2023-01-17 DIAGNOSIS — Z79899 Other long term (current) drug therapy: Secondary | ICD-10-CM | POA: Insufficient documentation

## 2023-01-17 DIAGNOSIS — I050 Rheumatic mitral stenosis: Secondary | ICD-10-CM

## 2023-01-17 LAB — CBC
HCT: 31.8 % — ABNORMAL LOW (ref 36.0–46.0)
Hemoglobin: 11.6 g/dL — ABNORMAL LOW (ref 12.0–15.0)
MCH: 30.1 pg (ref 26.0–34.0)
MCHC: 36.5 g/dL — ABNORMAL HIGH (ref 30.0–36.0)
MCV: 82.4 fL (ref 80.0–100.0)
Platelets: 167 10*3/uL (ref 150–400)
RBC: 3.86 MIL/uL — ABNORMAL LOW (ref 3.87–5.11)
RDW: 14.8 % (ref 11.5–15.5)
WBC: 8.4 10*3/uL (ref 4.0–10.5)
nRBC: 0 % (ref 0.0–0.2)

## 2023-01-17 LAB — BASIC METABOLIC PANEL
Anion gap: 10 (ref 5–15)
BUN: 26 mg/dL — ABNORMAL HIGH (ref 8–23)
CO2: 24 mmol/L (ref 22–32)
Calcium: 9.6 mg/dL (ref 8.9–10.3)
Chloride: 102 mmol/L (ref 98–111)
Creatinine, Ser: 1.19 mg/dL — ABNORMAL HIGH (ref 0.44–1.00)
GFR, Estimated: 43 mL/min — ABNORMAL LOW (ref >60)
Glucose, Bld: 90 mg/dL (ref 70–99)
Potassium: 4.2 mmol/L (ref 3.5–5.1)
Sodium: 136 mmol/L (ref 135–145)

## 2023-01-17 LAB — BRAIN NATRIURETIC PEPTIDE: B Natriuretic Peptide: 193.3 pg/mL — ABNORMAL HIGH (ref 0.0–100.0)

## 2023-01-17 LAB — TROPONIN I (HIGH SENSITIVITY): Troponin I (High Sensitivity): 12 ng/L (ref <18)

## 2023-01-17 NOTE — Patient Instructions (Addendum)
 Medication Instructions:  No changes *If you need a refill on your cardiac medications before your next appointment, please call your pharmacy*  Lab Work: No labs  Testing/Procedures: No testing  Follow-Up: At [facility], you and your health needs are our priority.  As part of our continuing mission to provide you with exceptional heart care, we have created designated Provider Care Teams.  These Care Teams include your primary Cardiologist (physician) and Advanced Practice Providers (APPs -  Physician Assistants and Nurse Practitioners) who all work together to provide you with the care you need, when you need it.  We recommend signing up for the patient portal called MyChart.  Sign up information is provided on this After Visit Summary.  MyChart is used to connect with patients for Virtual Visits (Telemedicine).  Patients are able to view lab/test results, encounter notes, upcoming appointments, etc.  Non-urgent messages can be sent to your provider as well.   To learn more about what you can do with MyChart, go to [email or web address].    Your next appointment:   02/21/23 at 1:30 with Callie Goodrich, PA-C  Other Instructions Proceed to ED as recommended per Katlyn West, NP

## 2023-01-17 NOTE — Telephone Encounter (Signed)
 Pt c/o Shortness Of Breath: STAT if SOB developed within the last 24 hours or pt is noticeably SOB on the phone  1. Are you currently SOB (can you hear that pt is SOB on the phone)? Yes  2. How long have you been experiencing SOB? For a while  3. Are you SOB when sitting or when up moving around? Moving round, but when she is sitting down she feels very dizzy  4. Are you currently experiencing any other symptoms? Feels very dizzy and feels like she will pass out.

## 2023-01-17 NOTE — ED Triage Notes (Signed)
Pt arrives with c/o SOB that has worsened over the past 1-2 weeks. Pt has not been taking her lasix as prescribed. Pt was sent her from cardiologist office. Pt reports dizziness and generalized weakness. Pt denies CP.

## 2023-01-17 NOTE — Discharge Instructions (Signed)
Your xray did not show pneumonia.  I recommend that you take your lasix 20 mg once daily for 3 days as prescribed by your cardiology office today.  Follow up with your doctor's office.

## 2023-01-17 NOTE — Progress Notes (Signed)
 Cardiology Office Note    Date:  01/17/2023  ID:  Catherine, Thornton 02-10-1927, MRN [id number] PCP:  Catherine Trula SHAUNNA, MD  Cardiologist:  Catherine Clay, MD  Electrophysiologist:  None   Chief Complaint: Shortness of breath   History of Present Illness: .    [name] is a 89 y.o. female with visit-pertinent history of mild nonobstructive CAD noted on coronary CTA in 11/2021, palpitations with PACs/PVCs and short runs of PAT and nonsustained VT noted on prior monitors, moderate aortic stenosis/mild aortic insufficiency, mild mitral regurgitation, hypertension complicated by dysautonomia orthostatic hypotension, dyslipidemia, DVT in 12/2018, TIA, CKD stage III, GERD with esophageal dysmotility and anemia.  She is followed by Dr. Clay.  She is presenting today for an acute visit regarding increased shortness of breath and dizziness.  [name] was previously followed by Dr. Lavon, now followed by Dr. Clay.  She had a Myoview  in 11/2016 for evaluation of atypical chest pain that was low risk and showed no evidence of ischemia.  A coronary CTA and echo were ordered at a visit in 11/2021 for further evaluation of dyspnea on exertion.  Coronary CTA showed a coronary calcium  score of 255 which was 73rd percentile for age and sex matched control and mild mixed nonobstructive CAD.  Echo showed LVEF of 60 to 65% with normal wall motion, normal RV function, moderate aortic stenosis with mild aortic insufficiency and moderate MAC with mild mitral regurgitation.  A ZIO monitor was ordered for reports of dizziness and palpitations.  Monitor showed underlying sinus rhythm with an average heart rate of 70 bpm, she had 55 brief atrial runs, longest episode lasting 28 beats and rare PVCs/PACs.  Triggered symptoms tended to correlate more with PVCs.  In 06/2022 she was admitted for syncope.  She is walking in her living room when she started to feel faint and lightheaded and then passed out and fell.  She  fractured her right wrist and all sustained multiple facial and fractures.  She was seen by ENT, Ortho and trauma services, no surgical interventions were felt to be necessary.  Echocardiogram showed LVEF of 65 to 70% with mild LVH and grade 1 diastolic dysfunction, normal RV function, moderate aortic stenosis/mild aortic regurgitation, mild mitral regurgitation and stenosis and a small pericardial effusion.  Repeat ZIO monitor was ordered and again showed underlying sinus rhythm with 21 atrial runs, longest episode lasting 11 beats, 2 short burst of NSVT, longest run 8 beats and occasional PVCs with a 2.2% burden.  There were not significant arrhythmias to explain her syncopal episode.  She was seen by Dr. Clay for follow-up in 07/2022 at which time she denied any recurrent falls but did describe orthostasis with lightheaded and dizziness with standing up.  Her syncope was felt to be due to dysautonomia and vasovagal symptoms but she also describes symptoms of vertigo.  She was started on midodrine  2.5 mg in the morning and was educated on the importance of staying well-hydrated, elevating legs is much as possible and wearing compression stockings.  She was last seen in clinic on 11/08/2022 by Catherine Door, PA.  She denied any recurrent syncopal episodes but did report some occasional lightheadedness/dizziness for which she had to sit down.  She denies any palpitations.  Her main complaint was ongoing respiratory issues.  She had seen her pulmonologist the day prior to her appointment.  Per notes she reported improvement in breathing with inhalers although there was concern that she was not  using her inhalers appropriately based off direct visualization in the office.  She has chronic lower extremity edema but this was reported as stable.  Her BNP was mildly elevated at 338.2, she was started on Lasix  20 mg daily for 3 days.  Today she presents for an acute visit for worsening shortness of breath and  dizziness.  On the phone with triage patient was encouraged to present to the emergency room as she was audibly short of breath, patient refused. At visit today she reports worsening shortness of breath and dizziness for the last several weeks.  She denies chest pain, lower extremity edema, presyncope or syncope.  She notes that she has a history of vertigo and when she first lays down she becomes increasingly dizzy which will then settle out over time, denies any recent presyncope or syncope.  She notes that she has been compliant with her inhalers, although she notes she did not use her albuterol  inhaler today. She is visibly short of breath while at rest.   ROS: .   Today she denies chest pain, lower extremity edema, fatigue, palpitations, melena, hematuria, hemoptysis, diaphoresis, weakness, presyncope, syncope, orthopnea, and PND.  All other systems are reviewed and otherwise negative. Studies Reviewed: SABRA    EKG:  EKG is ordered today, personally reviewed, demonstrating  EKG Interpretation Date/Time:  Tuesday January 17 2023 14:19:08 EST Ventricular Rate:  79 PR Interval:  138 QRS Duration:  106 QT Interval:  390 QTC Calculation: 447 R Axis:   -29  Text Interpretation: Normal sinus rhythm Possible Left atrial enlargement Incomplete right bundle branch block No significant change compared to prior tracings Confirmed by [name] [phone]) on 01/17/2023 2:23:53 PM   CV Studies:  Cardiac Studies & Procedures     STRESS TESTS  MYOCARDIAL PERFUSION IMAGING 11/29/2016  Narrative  The left ventricular ejection fraction is hyperdynamic (>65%).  Nuclear stress EF: 72%.  Blood pressure demonstrated a normal response to exercise.  No T wave inversion was noted during stress.  Upsloping ST segment depression ST segment depression was noted during stress in the II, III, aVF, V6, V5 and V4 leads.  The study is normal.  This is a low risk study.  ECHOCARDIOGRAM  ECHOCARDIOGRAM  COMPLETE 06/23/2022  Narrative ECHOCARDIOGRAM REPORT    Patient Name:   [name] Date of Exam: 06/23/2022 Medical Rec #:  [id number]     Height:       63.0 in Accession #:    [id number]    Weight:       125.0 lb Date of Birth:  1928-01-09     BSA:          1.584 m Patient Age:    89 years      BP:           120/66 mmHg Patient Gender: F             HR:           89 bpm. Exam Location:  Inpatient  Procedure: 2D Echo, Color Doppler and Cardiac Doppler  Indications:    R55 Syncope  History:        Patient has prior history of Echocardiogram examinations, most recent 12/15/2021. Risk Factors:Hypertension and Dyslipidemia.  Sonographer:    Damien Senior RDCS Referring Phys: JORIE JONELLE BLANCH   Sonographer Comments: Suboptimal apical windows due to small rib spaces. IMPRESSIONS   1. Left ventricular ejection fraction, by estimation, is 65 to 70%. The left ventricle has normal  function. The left ventricle has no regional wall motion abnormalities. There is mild concentric left ventricular hypertrophy. Left ventricular diastolic parameters are consistent with Grade I diastolic dysfunction (impaired relaxation). 2. Right ventricular systolic function is normal. The right ventricular size is normal. There is normal pulmonary artery systolic pressure. 3. A small pericardial effusion is present. The pericardial effusion is posterior to the left ventricle. 4. The mitral valve is abnormal. Mild mitral valve regurgitation. Mild mitral stenosis. The mean mitral valve gradient is 5.0 mmHg. Moderate mitral annular calcification. 5. Fused left and non cusps. The aortic valve is calcified. There is moderate calcification of the aortic valve. Aortic valve regurgitation is mild. Moderate aortic valve stenosis. Aortic valve area, by VTI measures 1.09 cm. Aortic valve mean gradient measures 21.0 mmHg. Aortic valve Vmax measures 3.22 m/s. 6. The inferior vena cava is normal in size with greater than 50%  respiratory variability, suggesting right atrial pressure of 3 mmHg.  FINDINGS Left Ventricle: Left ventricular ejection fraction, by estimation, is 65 to 70%. The left ventricle has normal function. The left ventricle has no regional wall motion abnormalities. The left ventricular internal cavity size was normal in size. There is mild concentric left ventricular hypertrophy. Left ventricular diastolic parameters are consistent with Grade I diastolic dysfunction (impaired relaxation).  Right Ventricle: The right ventricular size is normal. No increase in right ventricular wall thickness. Right ventricular systolic function is normal. There is normal pulmonary artery systolic pressure. The tricuspid regurgitant velocity is 2.31 m/s, and with an assumed right atrial pressure of 3 mmHg, the estimated right ventricular systolic pressure is 24.3 mmHg.  Left Atrium: Left atrial size was normal in size.  Right Atrium: Right atrial size was normal in size.  Pericardium: A small pericardial effusion is present. The pericardial effusion is posterior to the left ventricle.  Mitral Valve: The mitral valve is abnormal. There is moderate thickening of the mitral valve leaflet(s). There is moderate calcification of the mitral valve leaflet(s). Moderate mitral annular calcification. Mild mitral valve regurgitation. Mild mitral valve stenosis. MV peak gradient, 8.4 mmHg. The mean mitral valve gradient is 5.0 mmHg.  Tricuspid Valve: The tricuspid valve is normal in structure. Tricuspid valve regurgitation is mild.  Aortic Valve: Fused left and non cusps. The aortic valve is calcified. There is moderate calcification of the aortic valve. Aortic valve regurgitation is mild. Moderate aortic stenosis is present. Aortic valve mean gradient measures 21.0 mmHg. Aortic valve peak gradient measures 41.5 mmHg. Aortic valve area, by VTI measures 1.09 cm.  Pulmonic Valve: The pulmonic valve was normal in structure.  Pulmonic valve regurgitation is trivial.  Aorta: The aortic root and ascending aorta are structurally normal, with no evidence of dilitation.  Venous: The inferior vena cava is normal in size with greater than 50% respiratory variability, suggesting right atrial pressure of 3 mmHg.  IAS/Shunts: The interatrial septum was not well visualized.   LEFT VENTRICLE PLAX 2D LVIDd:         3.10 cm   Diastology LVIDs:         1.90 cm   LV e' medial:    5.33 cm/s LV PW:         1.00 cm   LV E/e' medial:  20.8 LV IVS:        1.10 cm   LV e' lateral:   4.90 cm/s LVOT diam:     2.10 cm   LV E/e' lateral: 22.7 LV SV:  68 LV SV Index:   43 LVOT Area:     3.46 cm   RIGHT VENTRICLE RV S prime:     14.70 cm/s TAPSE (M-mode): 2.1 cm  LEFT ATRIUM             Index        RIGHT ATRIUM           Index LA diam:        2.70 cm 1.71 cm/m   RA Area:     15.60 cm LA Vol (A2C):   31.0 ml 19.58 ml/m  RA Volume:   41.40 ml  26.14 ml/m LA Vol (A4C):   42.7 ml 26.97 ml/m LA Biplane Vol: 36.4 ml 22.99 ml/m AORTIC VALVE AV Area (Vmax):    1.10 cm AV Area (Vmean):   1.27 cm AV Area (VTI):     1.09 cm AV Vmax:           322.00 cm/s AV Vmean:          204.000 cm/s AV VTI:            0.628 m AV Peak Grad:      41.5 mmHg AV Mean Grad:      21.0 mmHg LVOT Vmax:         102.00 cm/s LVOT Vmean:        75.000 cm/s LVOT VTI:          0.197 m LVOT/AV VTI ratio: 0.31  AORTA Ao Root diam: 3.00 cm Ao Asc diam:  3.00 cm  MITRAL VALVE                TRICUSPID VALVE MV Area (PHT): 1.54 cm     TR Peak grad:   21.3 mmHg MV Peak grad:  8.4 mmHg     TR Vmax:        231.00 cm/s MV Mean grad:  5.0 mmHg MV Vmax:       1.45 m/s     SHUNTS MV Vmean:      103.0 cm/s   Systemic VTI:  0.20 m MV Decel Time: 494 msec     Systemic Diam: 2.10 cm MV E velocity: 111.00 cm/s MV A velocity: 144.00 cm/s MV E/A ratio:  0.77  Maude Emmer MD Electronically signed by Maude Emmer MD Signature Date/Time:  06/23/2022/11:49:04 AM    Final   MONITORS  LONG TERM MONITOR-LIVE TELEMETRY (3-14 DAYS) 07/26/2022  Narrative   14-day Zio patch June 2024   Predominant Rhythm: Sinus-rate range 48-146 bpm.  Average 79 bpm.   Occasional (2.2%) PVCs with rare couplets, triplets, bigeminy and trigeminy.-Noted on symptoms log.   Rare PACs and PVCs with rare couplets and triplets.   2 short bursts of PVCs (ventricular tachycardia) 6 and 8 beats.  Fastest was 6 beats-max rate 122 bpm (range 110 to 122 bpm, average 118 bpm, 3.3 seconds), longest was 8 beats at an average rate of 105 bpm (range 89-110 bpm, average 105 bpm, 4.8 seconds).   21 Atrial Runs 6-11 beats: Fastest 6 beats with rate range 136-188 bpm, average 176 bpm, 2.2 seconds.  Longest 11 beats, rate range 77-133 bpm, average 113 bpm, 6.3 seconds.   No Sustained Arrhythmias: Atrial Tachycardia (AT), Supraventricular Tachycardia (SVT), Atrial Fibrillation (A-Fib), Atrial Flutter (A-Flutter), Sustained Ventricular Tachycardia (VT)   Symptoms noted with sinus rhythm and PVCs  Overall relatively normal study.  No significant arrhythmias.  She may be feeling the short bursts of atrial runs, although  the monitor did not suggest this.  Catherine Clay, MD  CT SCANS  CT CORONARY MORPH W/CTA COR W/SCORE 12/13/2021  Addendum 12/13/2021  4:14 PM ADDENDUM REPORT: 12/13/2021 16:12  EXAM: OVER-READ INTERPRETATION  CT CHEST  The following report is an over-read performed by radiologist Dr. Jackquline Skates [facility] on 12/13/2021. This over-read does not include interpretation of cardiac or coronary anatomy or pathology. The CTA interpretation by the cardiologist is attached.  COMPARISON:  None.  FINDINGS: Limited view of the lung parenchyma demonstrates no suspicious nodularity. Airways are normal.  Limited view of the mediastinum demonstrates no adenopathy. Esophagus normal.  Limited view of the upper abdomen unremarkable.  Limited  view of the skeleton and chest wall is unremarkable.  IMPRESSION: No significant extracardiac findings.   Electronically Signed By: Jackquline Boxer M.D. On: 12/13/2021 16:12  Narrative HISTORY: 89 yo female with palpitations, no other cardiac signs/symptoms Dyspnea on exertion (DOE)  EXAM: Cardiac/Coronary CTA  TECHNIQUE: The patient was scanned on a [employer].  PROTOCOL: A 120 kV prospective scan was triggered in the descending thoracic aorta at 111 HU's. Axial non-contrast 3 mm slices were carried out through the heart. The data set was analyzed on a dedicated work station and scored using the Agatson method. Gantry rotation speed was 250 msecs and collimation was .6 mm. Beta blockade and 0.8 mg of sl NTG was given. The 3D data set was reconstructed in 5% intervals of the 35-75 % of the R-R cycle. Diastolic phases were analyzed on a dedicated work station using MPR, MIP and VRT modes. The patient received 95mL OMNIPAQUE  IOHEXOL  350 MG/ML SOLN of contrast.  FINDINGS: Quality: Fair, attenuation artifact, mis-registration artifact, HR 67  Coronary calcium  score: The patient's coronary artery calcium  score is 255, which places the patient in the 73rd percentile (Based on an 89 yo)  Coronary arteries: Normal coronary origins.  Right dominance.  Right Coronary Artery: Dominant. Minimal mixed ostial 1-24% stenosis.  Left Main Coronary Artery: Normal. Bifurcates into the LAD and LCX arteries.  Left Anterior Descending Coronary Artery: Mild mixed 25-49% proximal stenosis. 2 diagonal branches, the second branch is larger and bifurcates. No disease.  Left Circumflex Artery: AV groove LCX with OM branch, no disease.  Aorta: Normal size, 28 mm at the mid ascending aorta (level of the PA bifurcation) measured double oblique. Aortic atherosclerosis. No dissection.  Aortic Valve: Trileaflet.  Aortic annular calcification.  Other findings:  Normal pulmonary  vein drainage into the left atrium.  Normal left atrial appendage without a thrombus.  Normal size of the pulmonary artery.  Mitral annular calcification.  IMPRESSION: 1. Mild mixed non-obstructive CAD, CADRADS = 2.  2. Coronary calcium  score of 255. This was 73rd percentile for age and sex matched control (based on an 89 yo).  3. Normal coronary origin with right dominance.  4. Aortic and mitral annular calcification.  5. Aortic atherosclerosis.  Electronically Signed: By: Vinie JAYSON Maxcy M.D. On: 12/13/2021 15:46          Current Reported Medications:.    Current Meds  Medication Sig   acetaminophen  (TYLENOL ) 500 MG tablet Take 500-1,000 mg by mouth every 8 (eight) hours as needed for mild pain.    albuterol  (VENTOLIN  HFA) 108 (90 Base) MCG/ACT inhaler Inhale 2 puffs into the lungs every 4 (four) hours as needed for wheezing or shortness of breath.   alendronate  (FOSAMAX ) 70 MG tablet Take 1 tablet by mouth once a week.   aspirin  EC  81 MG tablet Take 1 tablet (81 mg total) by mouth every evening. Swallow whole.   Besifloxacin HCl (BESIVANCE) 0.6 % SUSP PLACE 1 DROP IN RIGHT EYE 4 TIMES DAILY FOR 2 DAYS   Cholecalciferol  (VITAMIN D -3) 25 MCG (1000 UT) CAPS Take 1,000 Units by mouth daily with breakfast.    clobetasol (TEMOVATE) 0.05 % external solution Apply 1 Application topically as needed.   famotidine (PEPCID) 40 MG tablet Take 40 mg by mouth daily.   furosemide  (LASIX ) 20 MG tablet Take 1 tablet (20 mg total) by mouth daily. Lasix  20mg  x 3 days; then she can take as needed for weight gain (3lbs in 1 day or 5lbs in 1 week) or worsening lower extremity edema.   HYDROcodone -acetaminophen  (NORCO/VICODIN) 5-325 MG tablet Take 1 tablet by mouth every 6 (six) hours as needed for severe pain.   Lactobacillus Rhamnosus, GG, (CULTURELLE) CAPS Take 1 capsule by mouth every morning.    Magnesium  250 MG TABS Take 250 mg by mouth daily as needed (leg cramps).   metoprolol  succinate  (TOPROL -XL) 25 MG 24 hr tablet Take 1 tablet (25 mg total) by mouth every evening. Kapspargo  sprinkle   midodrine  (PROAMATINE ) 2.5 MG tablet Take 1 tablet (2.5 mg total) by mouth daily after breakfast.   mirabegron  ER (MYRBETRIQ ) 50 MG TB24 tablet Take 1 tablet by mouth at bedtime.   Multiple Vitamins-Minerals (AIRBORNE PO) Take 1 tablet by mouth daily with breakfast.   Nutritional Supplements (,FEEDING SUPPLEMENT, PROSOURCE PLUS) liquid Take 30 mLs by mouth 2 (two) times daily between meals.   polyethylene glycol (MIRALAX  / GLYCOLAX ) 17 g packet Take 17 g by mouth daily as needed.   Polyvinyl Alcohol-Povidone (REFRESH OP) Place 1 drop into both eyes daily as needed (dry eyes).   protein supplement shake (PREMIER PROTEIN) LIQD Take 59.1 mLs (2 oz total) by mouth 3 (three) times daily between meals. (Patient taking differently: Take 2 oz by mouth 3 (three) times daily between meals. BOOST)   senna-docusate (SENOKOT-S) 8.6-50 MG tablet Take 2 tablets by mouth at bedtime as needed for mild constipation.   Tiotropium Bromide Monohydrate  (SPIRIVA  RESPIMAT) 1.25 MCG/ACT AERS Inhale 2 puffs into the lungs daily.   Trospium  Chloride 60 MG CP24 Take 1 capsule by mouth daily.   vitamin B-12 (CYANOCOBALAMIN ) 100 MCG tablet Take 100 mcg by mouth daily.   Physical Exam:    VS:  BP 113/64 (BP Location: Right Arm, Patient Position: Standing, Cuff Size: Normal)   Pulse 87   Ht 5' 5 (1.651 m)   Wt 118 lb (53.5 kg)   SpO2 97%   BMI 19.64 kg/m    Wt Readings from Last 3 Encounters:  01/17/23 118 lb (53.5 kg)  11/08/22 120 lb (54.4 kg)  11/07/22 124 lb (56.2 kg)    GEN: Well nourished, well developed  NECK: No JVD; No carotid bruits CARDIAC: RRR, 2/6 systolic murmur, no rubs or gallops RESPIRATORY:  Clear to auscultation without rales, wheezing or rhonchi, increased respiratory rate, audibly short of breath at rest  ABDOMEN: Soft, non-tender, non-distended EXTREMITIES:  No edema; No acute deformity    Asessement and Plan:.    Shortness of breath: Per notes patient has had history of dyspnea on exertion since having COVID in 2021.  Echo in 06/2022 showed LVEF of 65 to 70% with normal wall motion, mild LVH, grade 1 diastolic dysfunction as well as valvular disease. Today she reports increased shortness of breath since November she is audibly short of breath  in the room at rest and with talking. She reports that she has been compliant with her inhalers, she did not try her albuterol  this morning. She notes that she did take 1 dose of oral Lasix  this morning, she is unsure if she had any improvement. Her weights overall appear stable, if not decreased, she has no lower extremity edema. She denies chest pain, palpitations, orthopnea or PND.  Her lung sounds are overall clear, no crackles or wheezing currently appreciated.  Her oxygen saturation was 96%. Patient has history of moderate aortic stenosis, murmur unchanged and no obvious fluid overload. Patient discussed with Dr. MALVA' Rosalynn (DOD at [facility]) who also evaluated and spoke with patient, it was recommended by myself and Dr. MALVATHEORA Rosalynn that she be evaluated in the emergency room. Patient initially refused evaluation in the emergency room, after discussion with her son she was agreeable to be evaluated at med center [facility].  Patient refused emergency transport. Patients son presented to office and plans to take patient to [facility] for further evaluation.   Dizziness/Dysautonomia orthostatic hypotension/hypertension: Patient has a history of hypertension and dysautonomia orthostatic hypotension.  She had a syncopal event in 06/2022, sustained a right wrist fracture and multiple facial injuries. At office visit on 11/08/2022 she reported occasional lightheadedness/dizziness that occurred after she would get acutely short of breath. Today she reports dizziness that she more associates with vertigo.  Given her increased shortness of breath  recommended evaluation in the emergency room as noted above.  Palpitations: Cardiac monitor and 07/2022 after syncopal episode showed underlying sinus rhythm with 21 atrial runs, longest episode lasting 11 beats, 2 short bursts of NSVT, longest run 8 beats and occasional PVCs at a 2.2% burden.  No significant arrhythmias.  Today she denies any palpitations or feeling of increased heartbeat.  She denies any further presyncope or syncope. Continue Toprol -XL 25mg  daily   Moderate aortic stenosis/mild aortic regurgitation/mild mitral regurgitation/stenosis: Noted on her echo in 06/2022.  At last office visit patient noted she would most likely not want to have surgery.    Disposition: Patient to present to [facility] with her son for further evaluation.   Signed, Nailea Whitehorn D Lando Alcalde, NP

## 2023-01-17 NOTE — ED Provider Notes (Signed)
 [name] Provider Note   CSN: [id number] Arrival date & time: 01/17/23  1620     History  Chief Complaint  Patient presents with   Shortness of Breath    [name] is a 89 y.o. female with history of moderate aortic stenosis, mild nonobstructive CAD in 2023 coronary CT, PVCs, mitral regurg, hypertension, dysautonomic orthostatic hypotension, dyslipidemia, presenting to the ED with dyspnea.  This is a longstanding ongoing issue the patient dates back to when she had COVID 3 or 4 years ago.  She says she has had dyspnea on exertion since then.  She feels she has labored breathing with activity.  At rest that is not labored.  She has seen a pulmonologist and her cardiologist, today was at the cardiology office, or there was concern that she was having increased shortness of breath, she was referred to the ED for further evaluation.  The patient was restarted on low-dose Lasix  20 mg for 3 days by the cardiologist but has not started this yet.  She typically uses Lasix  as needed.  She denies any unilateral leg swelling.  Denies any persistent chest pain.  She does have reflux issues.  HPI     Home Medications Prior to Admission medications   Medication Sig Start Date End Date Taking? Authorizing Provider  acetaminophen  (TYLENOL ) 500 MG tablet Take 500-1,000 mg by mouth every 8 (eight) hours as needed for mild pain.     [provider]  albuterol  (VENTOLIN  HFA) 108 (90 Base) MCG/ACT inhaler Inhale 2 puffs into the lungs every 4 (four) hours as needed for wheezing or shortness of breath. 11/07/22   Hunsucker, Donnice SAUNDERS, MD  alendronate  (FOSAMAX ) 70 MG tablet Take 1 tablet by mouth once a week.    [provider]  aspirin  EC 81 MG tablet Take 1 tablet (81 mg total) by mouth every evening. Swallow whole. 01/24/22   Anner Alm ORN, MD  Besifloxacin HCl (BESIVANCE) 0.6 % SUSP PLACE 1 DROP IN RIGHT EYE 4 TIMES DAILY FOR 2 DAYS     [provider]  Cholecalciferol  (VITAMIN D -3) 25 MCG (1000 UT) CAPS Take 1,000 Units by mouth daily with breakfast.     [provider]  clobetasol (TEMOVATE) 0.05 % external solution Apply 1 Application topically as needed.    [provider]  famotidine (PEPCID) 40 MG tablet Take 40 mg by mouth daily.    [provider]  furosemide  (LASIX ) 20 MG tablet Take 1 tablet (20 mg total) by mouth daily. Lasix  20mg  x 3 days; then she can take as needed for weight gain (3lbs in 1 day or 5lbs in 1 week) or worsening lower extremity edema. 11/11/22 02/09/23  Goodrich, Callie E, PA-C  HYDROcodone -acetaminophen  (NORCO/VICODIN) 5-325 MG tablet Take 1 tablet by mouth every 6 (six) hours as needed for severe pain. 06/25/22   Amin, Ankit C, MD  Lactobacillus Rhamnosus, GG, (CULTURELLE) CAPS Take 1 capsule by mouth every morning.     [provider]  Magnesium  250 MG TABS Take 250 mg by mouth daily as needed (leg cramps).    [provider]  metoprolol  succinate (TOPROL -XL) 25 MG 24 hr tablet Take 1 tablet (25 mg total) by mouth every evening. Kapspargo  sprinkle 01/24/22   Anner Alm ORN, MD  midodrine  (PROAMATINE ) 2.5 MG tablet Take 1 tablet (2.5 mg total) by mouth daily after breakfast. 07/27/22   Anner Alm ORN, MD  mirabegron  ER (MYRBETRIQ ) 50 MG  TB24 tablet Take 1 tablet by mouth at bedtime.    [provider]  Multiple Vitamins-Minerals (AIRBORNE PO) Take 1 tablet by mouth daily with breakfast.    [provider]  Multiple Vitamins-Minerals (CENTRUM SILVER ULTRA WOMENS) TABS Take 1 tablet by mouth daily with breakfast. Patient not taking: Reported on 01/17/2023    [provider]  Multiple Vitamins-Minerals (ICAPS AREDS 2 PO) Take 1 capsule by mouth daily at 12 noon. Patient not taking: Reported on 01/17/2023    [provider]  Nutritional Supplements (,FEEDING SUPPLEMENT, PROSOURCE PLUS) liquid Take 30 mLs by mouth 2  (two) times daily between meals. 10/22/21   Patel, Pranav M, MD  polyethylene glycol (MIRALAX  / GLYCOLAX ) 17 g packet Take 17 g by mouth daily as needed. 06/25/22   Amin, Ankit C, MD  Polyvinyl Alcohol-Povidone (REFRESH OP) Place 1 drop into both eyes daily as needed (dry eyes).    [provider]  protein supplement shake (PREMIER PROTEIN) LIQD Take 59.1 mLs (2 oz total) by mouth 3 (three) times daily between meals. Patient taking differently: Take 2 oz by mouth 3 (three) times daily between meals. BOOST 10/22/21   Tobie Yetta HERO, MD  senna-docusate (SENOKOT-S) 8.6-50 MG tablet Take 2 tablets by mouth at bedtime as needed for mild constipation. 06/25/22   Amin, Ankit C, MD  Tiotropium Bromide Monohydrate  (SPIRIVA  RESPIMAT) 1.25 MCG/ACT AERS Inhale 2 puffs into the lungs daily. 05/12/22   Gladis Leonor HERO, MD  Trospium  Chloride 60 MG CP24 Take 1 capsule by mouth daily. 03/18/21   [provider]  vitamin B-12 (CYANOCOBALAMIN ) 100 MCG tablet Take 100 mcg by mouth daily.    [provider]      Allergies    Atorvastatin , Desmopressin  acetate, Iodinated contrast media, Myrbetriq  [mirabegron ], and Iodine    Review of Systems   Review of Systems  Physical Exam Updated Vital Signs BP (!) 164/67   Pulse 66   Temp 97.7 F (36.5 C) (Oral)   Resp 13   Ht 5' 5 (1.651 m)   Wt 53.5 kg   SpO2 100%   BMI 19.64 kg/m  Physical Exam Constitutional:      General: She is not in acute distress. HENT:     Head: Normocephalic and atraumatic.  Eyes:     Conjunctiva/sclera: Conjunctivae normal.     Pupils: Pupils are equal, round, and reactive to light.  Cardiovascular:     Rate and Rhythm: Normal rate and regular rhythm.     Heart sounds: Murmur heard.  Pulmonary:     Effort: Pulmonary effort is normal. No respiratory distress.  Abdominal:     General: There is no distension.     Tenderness: There is no abdominal tenderness.  Skin:    General: Skin is warm and dry.   Neurological:     General: No focal deficit present.     Mental Status: She is alert. Mental status is at baseline.  Psychiatric:        Mood and Affect: Mood normal.        Behavior: Behavior normal.     ED Results / Procedures / Treatments   Labs (all labs ordered are listed, but only abnormal results are displayed) Labs Reviewed  BASIC METABOLIC PANEL - Abnormal; Notable for the following components:      Result Value   BUN 26 (*)    Creatinine, Ser 1.19 (*)    GFR, Estimated 43 (*)    All other  components within normal limits  CBC - Abnormal; Notable for the following components:   RBC 3.86 (*)    Hemoglobin 11.6 (*)    HCT 31.8 (*)    MCHC 36.5 (*)    All other components within normal limits  BRAIN NATRIURETIC PEPTIDE - Abnormal; Notable for the following components:   B Natriuretic Peptide 193.3 (*)    All other components within normal limits  TROPONIN I (HIGH SENSITIVITY)    EKG None  Radiology DG Chest 2 View Result Date: 01/17/2023 CLINICAL DATA:  SOB EXAM: CHEST - 2 VIEW COMPARISON:  11/28/2022. FINDINGS: The heart size and mediastinal contours are within normal limits. Both lungs are clear. No pneumothorax or pleural effusion. Aorta is calcified. There are thoracic degenerative changes. IMPRESSION: No acute cardiopulmonary disease. Electronically Signed   By: Fonda Field M.D.   On: 01/17/2023 19:11    Procedures Procedures    Medications Ordered in ED Medications - No data to display  ED Course/ Medical Decision Making/ A&P                                 Medical Decision Making Amount and/or Complexity of Data Reviewed Labs: ordered. Radiology: ordered.   This patient presents to the ED with concern for ornis of breath and dyspnea on exertion. This involves an extensive number of treatment options, and is a complaint that carries with it a high risk of complications and morbidity.  The differential diagnosis includes pneumonia was  pneumothorax with viral URI versus long COVID versus anemia versus congestive heart failure versus  Low suspicion for acute PE.  The patient is not hypoxic or tachycardic.  Co-morbidities that complicate the patient evaluation: History of COVID at risk of long COVID  External records from outside source obtained and reviewed including cardiology office evaluation  I ordered and personally interpreted labs.  The pertinent results include: No emergent findings.  Specifically no acute anemia.  BNP very mild elevation at 173.  Troponin is negative.  BMP near baseline  I ordered imaging studies including x-ray of the chest I independently visualized and interpreted imaging which showed no emergent findings I agree with the radiologist interpretation  The patient was maintained on a cardiac monitor.  I personally viewed and interpreted the cardiac monitored which showed an underlying rhythm of: Regular heart rate  Per my interpretation the patient's ECG shows no acute ischemic findings  I have reviewed the patients home medicines and have made adjustments as needed  After the interventions noted above, I reevaluated the patient and found that they have: stayed the same   On my clinical assessment the patient is comfortable, speaking full sentences, without labored breathing.  Her oxygen level is 98% or higher.  She is not requiring hospitalization at this time.  She does have moderate aortic stenosis and a notable aortic murmur, and describes episodes of lightheadedness which appear to be postural.  This may be an issue of aortic insufficiency but I will defer this further workup to her cardiology team.  Long COVID is also possible given her report of dyspnea beginning after she had COVID in the hospital 3 or 4 years ago.  But I do not hear wheezing on exam to suggest a reactive airway disease or benefit from breathing treatment at this time.  Separately she describes symptoms consistent with  peripheral vertigo, where she says when she lays down in bed at  night she will briefly have for about a minute vertigo symptoms, and again when she stands up in the morning.  She does take her time standing up.  I do not suspect a central cause of the symptoms.  I think it would be reasonable for her to take the 3 days of Lasix  as prescribed by the cardiologist, but I would not pursue more aggressive diuresis, given her overall clinical well appearance my concern for orthostatic hypotension.  BP's have been labile from the office (lower) to here (higher).    Dispostion:  After consideration of the diagnostic results and the patients response to treatment, I feel that the patent would benefit from outpatient follow-up         Final Clinical Impression(s) / ED Diagnoses Final diagnoses:  Shortness of breath    Rx / DC Orders ED Discharge Orders     None         Cottie Donnice PARAS, MD 01/18/23 1232

## 2023-01-17 NOTE — Telephone Encounter (Signed)
 Phone call transferred from scheduling dept for patient c/o shortness of breath and dizziness. Patient states that since Christmas she had noticed and increase in shortness of breath and dizziness. She states all she has done was rest hoping things would improve. When questioned about taking her furosemide  and she stated at first that she didn't know what that was and may have thrown the bottle away. Patient searched her bottles but was able to find it and states she had no idea what that was for. She was repeatedly informed that she should go to urgent care or the ER to be fully evaluated for fluid overload and she adamantly refused. She was getting winded talking on the phone with me. Pt states I will only come in for an appointment and I will just wait until you have one. Patient instructed to take one of her 20 mg Furosemide  as directed on the bottle and keep her new 2 pm appointment with MARLA Mose ARNP today. Message sent to ARNP as patient needs medication compliance education.

## 2023-01-18 DIAGNOSIS — R0602 Shortness of breath: Secondary | ICD-10-CM | POA: Diagnosis not present

## 2023-01-25 DIAGNOSIS — N1832 Chronic kidney disease, stage 3b: Secondary | ICD-10-CM | POA: Diagnosis not present

## 2023-01-25 DIAGNOSIS — R0609 Other forms of dyspnea: Secondary | ICD-10-CM | POA: Diagnosis not present

## 2023-01-25 DIAGNOSIS — I5030 Unspecified diastolic (congestive) heart failure: Secondary | ICD-10-CM | POA: Diagnosis not present

## 2023-02-06 ENCOUNTER — Encounter (INDEPENDENT_AMBULATORY_CARE_PROVIDER_SITE_OTHER): Payer: Medicare PPO | Admitting: Ophthalmology

## 2023-02-06 DIAGNOSIS — H353231 Exudative age-related macular degeneration, bilateral, with active choroidal neovascularization: Secondary | ICD-10-CM

## 2023-02-06 DIAGNOSIS — H35033 Hypertensive retinopathy, bilateral: Secondary | ICD-10-CM

## 2023-02-06 DIAGNOSIS — H43813 Vitreous degeneration, bilateral: Secondary | ICD-10-CM | POA: Diagnosis not present

## 2023-02-06 DIAGNOSIS — I10 Essential (primary) hypertension: Secondary | ICD-10-CM | POA: Diagnosis not present

## 2023-02-09 ENCOUNTER — Ambulatory Visit: Payer: Medicare PPO | Admitting: Student

## 2023-02-12 NOTE — Progress Notes (Signed)
 Cardiology Office Note:    Date:  02/21/2023   ID:  Erynn, Vaca Sep 17, 1927, MRN [id number]  PCP:  Dwight Trula SHAUNNA, MD  Cardiologist:  Alm Clay, MD     Referring MD: Dwight Trula SHAUNNA, MD   Chief Complaint: follow-up of dyspnea  History of Present Illness:    [name] is a 89 y.o. female with a history of mild non-obstructive CAD noted on coronary CTA in 11/2021, palpitations with PACs/ PVCs and short runs of PAT and non-sustained VT noted on prior monitors, moderate aortic stenosis/ mild aortic insufficiency, mild mitral regurgitation, hypertension complicated by dysautonomia orthostatic hypotension, dyslipidemia, DVT in 12/2018, TIA, CKD stage III, GERD with esophageal dysmotility, and anemia who is followed by Dr. Clay and presents today for follow-up of dyspnea  Patient was previously followed by Dr. Lavon and now follows with Dr. Clay. Myoview  in 11/2016 for further evaluation of  atypical chest pain was low risk and showed no evidence of ischemia. A coronary CTA and Echo were ordered at a visit in 11/2021 for further evaluation of dyspnea on exertion. Coronary CTA showed a coronary calcium  score of 255 (73rd percentile for age and sex) and mild mixed non-obstructive CAD. Echo showed LVEF of 60-65% with normal wall motion, normal RV function, moderate aortic stenosis / mil aortic insufficiency, and moderate MAC with mild mitral regurgitation. A Zio monitor was also ordered at that time given reports of dizziness and palpitations.Monitor showed underlying sinus rhythm (average heart rate of 70 bpm) with 55 brief atrial runs (longest episode lasting 28 beats) and rare PVCs/ PACs. Triggered symptoms tended to correlate more with PVCs.    She was admitted in 06/2022 for syncope and sustained a right wrist and multiple facial fractures at that time. Echo showed LVEF of 65-70% with mild LVH and grade 1 diastolic dysfunction, normal RV function, moderate aortic stenosis/ mild aortic  regurgitation, mild mitral regurgitation/ stenosis, and a small pericardial effusion. Repeat Zio monitor was ordered and again showed underling sinus rhythm with 21 atrial runs (longet episode last 11 beats), 2 short burts of NSVT (longest run 8 beats), and occasional PVCs (2.2% burden). However, there were no significant arrhythmias to explain her syncopal episode. Syncope was ultimately felt to be due to dysautonomia and vasovagal symptoms but she also described symptoms of vertigo. She was started on Midodrine .   She was last seen by Catherine West, NP, on 01/17/2023 at which time she reported worsening shortness of breath and dizziness. She was noticeably short of breath in the office and was advised to go to the ED for further evaluation. BNP was mildly elevated at 193 and high-sensitivity troponin was negative. Chest x-ray showed no acute findings. Lasix  was increased for 3 days. There was also question whether she could have some degree of long COVID.   Patient presents today for follow-up. Here alone. She continues to have dyspnea on exertion which she states waxes and wanes. She will have good spells and bad spells. However, overall she feels like her breathing has improved from last visit.  She denies any orthopnea, PND, or edema. She denies any weight gain. She reports some palpitations that she describes a feeling like her heart is beating fast when she is walking and she gets short of breath but this improves quickly with rest. No palpitations outside of this. She initially reported some chest pain but with further questioning she was referring to her palpitations. No other chest pain/ discomfort. She still  has some dizziness when she lays down flat but this has improved. She spoke with her PCP who offered vestibular therapy for vertigo but patient declined. She makes sure she changes positions slowly which helps control her orthostatic symptoms. No syncope. She does report some trouble eating/  swallowing lately and states she keeps losing weight. She has seen GI in the past so recommended following back up with them.   EKGs/Labs/Other Studies Reviewed:    The following studies were reviewed:  Coronary CTA 12/13/2021: Impressions: 1. Mild mixed non-obstructive CAD, CADRADS = 2. 2. Coronary calcium  score of 255. This was 73rd percentile for age and sex matched control (based on an 89 yo). 3. Normal coronary origin with right dominance. 4. Aortic and mitral annular calcification. 5. Aortic atherosclerosis. _______________   Echocardiogram 06/23/2022: Impressions:  1. Left ventricular ejection fraction, by estimation, is 65 to 70%. The  left ventricle has normal function. The left ventricle has no regional  wall motion abnormalities. There is mild concentric left ventricular  hypertrophy. Left ventricular diastolic  parameters are consistent with Grade I diastolic dysfunction (impaired  relaxation).   2. Right ventricular systolic function is normal. The right ventricular  size is normal. There is normal pulmonary artery systolic pressure.   3. A small pericardial effusion is present. The pericardial effusion is  posterior to the left ventricle.   4. The mitral valve is abnormal. Mild mitral valve regurgitation. Mild  mitral stenosis. The mean mitral valve gradient is 5.0 mmHg. Moderate  mitral annular calcification.   5. Fused left and non cusps. The aortic valve is calcified. There is  moderate calcification of the aortic valve. Aortic valve regurgitation is  mild. Moderate aortic valve stenosis. Aortic valve area, by VTI measures  1.09 cm. Aortic valve mean gradient  measures 21.0 mmHg. Aortic valve Vmax measures 3.22 m/s.   6. The inferior vena cava is normal in size with greater than 50%  respiratory variability, suggesting right atrial pressure of 3 mmHg.  _______________   Geoffry Monitor 06/2022:   Predominant Rhythm: Sinus-rate range 48-146 bpm.  Average 79 bpm.    Occasional (2.2%) PVCs with rare couplets, triplets, bigeminy and trigeminy.-Noted on symptoms log.   Rare PACs and PVCs with rare couplets and triplets.   2 short bursts of PVCs (ventricular tachycardia) 6 and 8 beats.  Fastest was 6 beats-max rate 122 bpm (range 110 to 122 bpm, average 118 bpm, 3.3 seconds), longest was 8 beats at an average rate of 105 bpm (range 89-110 bpm, average 105 bpm, 4.8 seconds).   21 Atrial Runs 6-11 beats: Fastest 6 beats with rate range 136-188 bpm, average 176 bpm, 2.2 seconds.  Longest 11 beats, rate range 77-133 bpm, average 113 bpm, 6.3 seconds.   No Sustained Arrhythmias: Atrial Tachycardia (AT), Supraventricular Tachycardia (SVT), Atrial Fibrillation (A-Fib), Atrial Flutter (A-Flutter), Sustained Ventricular Tachycardia (VT)   Symptoms noted with sinus rhythm and PVCs   Overall relatively normal study.  No significant arrhythmias.  She may be feeling the short bursts of atrial runs, although the monitor did not suggest this.  EKG:  EKG not ordered today.  Recent Labs: 01/17/2023: B Natriuretic Peptide 193.3; BUN 26; Creatinine, Ser 1.19; Hemoglobin 11.6; Platelets 167; Potassium 4.2; Sodium 136  Recent Lipid Panel    Component Value Date/Time   CHOL 214 (H) 10/20/2021 0101   TRIG 20 10/20/2021 0101   HDL 81 10/20/2021 0101   CHOLHDL 2.6 10/20/2021 0101   VLDL 4 10/20/2021 0101  LDLCALC 129 (H) 10/20/2021 0101    Physical Exam:    Vital Signs: BP (!) 120/50   Pulse 75   Ht 5' 3 (1.6 m)   Wt 120 lb (54.4 kg)   SpO2 99%   BMI 21.26 kg/m     Wt Readings from Last 3 Encounters:  02/21/23 120 lb (54.4 kg)  01/17/23 118 lb (53.5 kg)  01/17/23 118 lb (53.5 kg)     General: 89 y.o. thin frail  female in no acute distress. HEENT: Normocephalic and atraumatic. Sclera clear.  Neck: Supple.  No JVD. Heart: RRR. III/VI systolic murmur.   Lungs: No increased work of breathing. Clear to ausculation bilaterally. No wheezes, rhonchi, or rales.   Extremities: No lower extremity edema.   Skin: Warm and dry. Neuro: No focal deficits. Psych: Normal affect. Responds appropriately.  Assessment:    1. Dyspnea on exertion   2. Coronary artery disease involving native coronary artery of native heart without angina pectoris   3. Primary hypertension   4. Dysautonomia orthostatic hypotension syndrome   5. Palpitations   6. Valvular heart disease   7. Stage 3a chronic kidney disease (HCC)     Plan:    Chronic Dyspnea on Exertion Patient has a history of dyspnea on exertion ever since she had COVID in 2021. Dyspnea waxes and wanes.  - She continues to have dyspnea on exertion which waxes and wanes. Overall, she fills like this has improved from last visit.  - I don't think dyspnea is primarily cardiac in nature. She may have some degree of diastolic dysfunction. However,  she is euvolemic on exam. Can continue to use Lasix  20mg  as needed for weight gain and edema.  - It sounds like she may have long COVID syndrome. Recommended following up with PCP or Pulmonology.  Non-Obstructive CAD Coronary CTA in 11/2021 showed a coronary calcium  score of 255 (73rd percentile for age and sex) and mild mixed non-obstructive CAD. - No angina. - Continue Aspirin  81mg  daily. - She has been intolerant to statins in the past.  Hypertension Dysautonomia Orthostatic Hypotension Patient has a history of hypertension complicated by dysautonomia orthostatic hypotension which actually led to a syncopal episode in 06/2022 where she sustained a right wrist fracture and multiple facial fractures. She is now on Midodrine .  - BP stable. No significant orthostatic symptoms.  - Continue Toprol -XL 25mg  daily for palpitations.  - Continue Midodrine  2.5mg  in the morning.  - Of note, she does continue to have some dizziness but this sounds like vertigo and not orthostasis. PCP offered vestibular therapy but patient declined.    Palpitations Last monitor in 06/2022  after syncopal episode showed nderling sinus rhythm with 21 atrial runs (longet episode last 11 beats), 2 short burts of NSVT (longest run 8 beats), and occasional PVCs (2.2% burden) but no significant arrhythmias.  - She reports some heart racing when walking and after getting acutely short of breath. This resolves quickly with rest. No other palpitations. - Continue Toprol -XL 25mg  daily. Hesitant to up-titrate this given history of orthostatic hypotension and dizziness.    Moderate Aortic Stenosis Mild Aortic Regurgitation Mild Mitral Regurgitation/ Stenosis Noted on Echo in 06/2022.  - I think we can likely hold off on routine serial Echos at this point (unless she has worsening symptoms) given advanced age. She states she would most likely not want to have surgery anyway.    CKD Stage IIIa Baseline creatinine around 1.1 to 1.2. Stable at 1.19 in 12/2022.  Disposition: Follow up in 3 months.   Signed, Catherine Pitta E Nhu Glasby, PA-C  02/21/2023 5:30 PM    [facility]

## 2023-02-15 DIAGNOSIS — M19131 Post-traumatic osteoarthritis, right wrist: Secondary | ICD-10-CM | POA: Diagnosis not present

## 2023-02-15 DIAGNOSIS — M25531 Pain in right wrist: Secondary | ICD-10-CM | POA: Diagnosis not present

## 2023-02-15 DIAGNOSIS — M79641 Pain in right hand: Secondary | ICD-10-CM | POA: Diagnosis not present

## 2023-02-21 ENCOUNTER — Encounter: Payer: Self-pay | Admitting: Student

## 2023-02-21 ENCOUNTER — Ambulatory Visit: Payer: Medicare PPO | Attending: Student | Admitting: Student

## 2023-02-21 VITALS — BP 120/50 | HR 75 | Ht 63.0 in | Wt 120.0 lb

## 2023-02-21 DIAGNOSIS — N1831 Chronic kidney disease, stage 3a: Secondary | ICD-10-CM

## 2023-02-21 DIAGNOSIS — R002 Palpitations: Secondary | ICD-10-CM

## 2023-02-21 DIAGNOSIS — R0609 Other forms of dyspnea: Secondary | ICD-10-CM | POA: Diagnosis not present

## 2023-02-21 DIAGNOSIS — I10 Essential (primary) hypertension: Secondary | ICD-10-CM | POA: Diagnosis not present

## 2023-02-21 DIAGNOSIS — I2510 Atherosclerotic heart disease of native coronary artery without angina pectoris: Secondary | ICD-10-CM | POA: Diagnosis not present

## 2023-02-21 DIAGNOSIS — I951 Orthostatic hypotension: Secondary | ICD-10-CM | POA: Diagnosis not present

## 2023-02-21 DIAGNOSIS — I38 Endocarditis, valve unspecified: Secondary | ICD-10-CM | POA: Diagnosis not present

## 2023-02-21 MED ORDER — FUROSEMIDE 20 MG PO TABS: 20.0000 mg | ORAL_TABLET | ORAL | Status: DC | PRN

## 2023-02-21 NOTE — Patient Instructions (Signed)
 Medication Instructions:  TAKE YOUR FUROSEMIDE  (LASIX ) AS NEEDED FOR WEIGHT GAIN *If you need a refill on your cardiac medications before your next appointment, please call your pharmacy*  Lab Work: NONE  Other Instructions FOLLOW UP WITH YOUR PRIMARY CARE OR PULMONARY FOR POST COVID  Follow-Up: At [facility], you and your health needs are our priority.  As part of our continuing mission to provide you with exceptional heart care, we have created designated Provider Care Teams.  These Care Teams include your primary Cardiologist (physician) and Advanced Practice Providers (APPs -  Physician Assistants and Nurse Practitioners) who all work together to provide you with the care you need, when you need it.  We recommend signing up for the patient portal called MyChart.  Sign up information is provided on this After Visit Summary.  MyChart is used to connect with patients for Virtual Visits (Telemedicine).  Patients are able to view lab/test results, encounter notes, upcoming appointments, etc.  Non-urgent messages can be sent to your provider as well.   To learn more about what you can do with MyChart, go to [email or web address].    Your next appointment:   05-22-2023 AT 2:20PM  Provider:   Alm Clay, MD

## 2023-03-13 ENCOUNTER — Encounter (INDEPENDENT_AMBULATORY_CARE_PROVIDER_SITE_OTHER): Payer: Medicare PPO | Admitting: Ophthalmology

## 2023-03-13 DIAGNOSIS — H35033 Hypertensive retinopathy, bilateral: Secondary | ICD-10-CM | POA: Diagnosis not present

## 2023-03-13 DIAGNOSIS — I10 Essential (primary) hypertension: Secondary | ICD-10-CM

## 2023-03-13 DIAGNOSIS — H43813 Vitreous degeneration, bilateral: Secondary | ICD-10-CM

## 2023-03-13 DIAGNOSIS — H353231 Exudative age-related macular degeneration, bilateral, with active choroidal neovascularization: Secondary | ICD-10-CM | POA: Diagnosis not present

## 2023-03-20 DIAGNOSIS — H04123 Dry eye syndrome of bilateral lacrimal glands: Secondary | ICD-10-CM | POA: Diagnosis not present

## 2023-03-20 DIAGNOSIS — H40013 Open angle with borderline findings, low risk, bilateral: Secondary | ICD-10-CM | POA: Diagnosis not present

## 2023-03-20 DIAGNOSIS — H353221 Exudative age-related macular degeneration, left eye, with active choroidal neovascularization: Secondary | ICD-10-CM | POA: Diagnosis not present

## 2023-03-20 DIAGNOSIS — H353212 Exudative age-related macular degeneration, right eye, with inactive choroidal neovascularization: Secondary | ICD-10-CM | POA: Diagnosis not present

## 2023-04-05 DIAGNOSIS — R0981 Nasal congestion: Secondary | ICD-10-CM | POA: Diagnosis not present

## 2023-04-05 DIAGNOSIS — R051 Acute cough: Secondary | ICD-10-CM | POA: Diagnosis not present

## 2023-04-10 DIAGNOSIS — M25512 Pain in left shoulder: Secondary | ICD-10-CM | POA: Diagnosis not present

## 2023-04-17 ENCOUNTER — Encounter (INDEPENDENT_AMBULATORY_CARE_PROVIDER_SITE_OTHER): Payer: Medicare PPO | Admitting: Ophthalmology

## 2023-04-17 DIAGNOSIS — N3941 Urge incontinence: Secondary | ICD-10-CM | POA: Diagnosis not present

## 2023-04-18 ENCOUNTER — Encounter (INDEPENDENT_AMBULATORY_CARE_PROVIDER_SITE_OTHER): Admitting: Ophthalmology

## 2023-04-19 DIAGNOSIS — I700 Atherosclerosis of aorta: Secondary | ICD-10-CM | POA: Diagnosis not present

## 2023-04-19 DIAGNOSIS — M1990 Unspecified osteoarthritis, unspecified site: Secondary | ICD-10-CM | POA: Diagnosis not present

## 2023-04-19 DIAGNOSIS — K219 Gastro-esophageal reflux disease without esophagitis: Secondary | ICD-10-CM | POA: Diagnosis not present

## 2023-04-19 DIAGNOSIS — I2510 Atherosclerotic heart disease of native coronary artery without angina pectoris: Secondary | ICD-10-CM | POA: Diagnosis not present

## 2023-04-19 DIAGNOSIS — J449 Chronic obstructive pulmonary disease, unspecified: Secondary | ICD-10-CM | POA: Diagnosis not present

## 2023-04-19 DIAGNOSIS — Z91041 Radiographic dye allergy status: Secondary | ICD-10-CM | POA: Diagnosis not present

## 2023-04-19 DIAGNOSIS — Z5919 Other inadequate housing: Secondary | ICD-10-CM | POA: Diagnosis not present

## 2023-04-19 DIAGNOSIS — K225 Diverticulum of esophagus, acquired: Secondary | ICD-10-CM | POA: Diagnosis not present

## 2023-04-19 DIAGNOSIS — H353290 Exudative age-related macular degeneration, unspecified eye, stage unspecified: Secondary | ICD-10-CM | POA: Diagnosis not present

## 2023-04-19 DIAGNOSIS — Z8616 Personal history of COVID-19: Secondary | ICD-10-CM | POA: Diagnosis not present

## 2023-04-19 DIAGNOSIS — Z79621 Long term (current) use of calcineurin inhibitor: Secondary | ICD-10-CM | POA: Diagnosis not present

## 2023-04-19 DIAGNOSIS — M8580 Other specified disorders of bone density and structure, unspecified site: Secondary | ICD-10-CM | POA: Diagnosis not present

## 2023-04-19 DIAGNOSIS — N1832 Chronic kidney disease, stage 3b: Secondary | ICD-10-CM | POA: Diagnosis not present

## 2023-04-19 DIAGNOSIS — F419 Anxiety disorder, unspecified: Secondary | ICD-10-CM | POA: Diagnosis not present

## 2023-04-19 DIAGNOSIS — E785 Hyperlipidemia, unspecified: Secondary | ICD-10-CM | POA: Diagnosis not present

## 2023-04-21 ENCOUNTER — Encounter (INDEPENDENT_AMBULATORY_CARE_PROVIDER_SITE_OTHER): Admitting: Ophthalmology

## 2023-04-25 ENCOUNTER — Encounter (INDEPENDENT_AMBULATORY_CARE_PROVIDER_SITE_OTHER): Admitting: Ophthalmology

## 2023-04-25 DIAGNOSIS — H353231 Exudative age-related macular degeneration, bilateral, with active choroidal neovascularization: Secondary | ICD-10-CM

## 2023-04-25 DIAGNOSIS — I10 Essential (primary) hypertension: Secondary | ICD-10-CM | POA: Diagnosis not present

## 2023-04-25 DIAGNOSIS — H43813 Vitreous degeneration, bilateral: Secondary | ICD-10-CM

## 2023-04-25 DIAGNOSIS — H35033 Hypertensive retinopathy, bilateral: Secondary | ICD-10-CM

## 2023-05-15 IMAGING — MR MR HEAD W/O CM
12 of 13 series · 43 of 48 positions shown · non-contrast
Comparison: CT/CTA head and neck dated 1 day prior, MR head
05/31/2018

CLINICAL DATA: Sudden onset aphasia/dysarthria

EXAM:
MRI HEAD WITHOUT CONTRAST
TECHNIQUE: Multiplanar, multiecho pulse sequences of the brain and surrounding
structures were obtained without intravenous contrast.

[Series 5: DWI · axial · 3.0mm · 0.88mm/px · z∈[-100,+39]mm · 6 of 96 slices shown (1 of 4)]
[im 1/96]
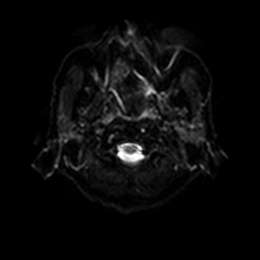
[im 20/96]
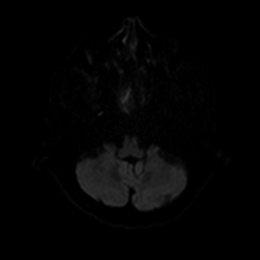
[im 39/96]
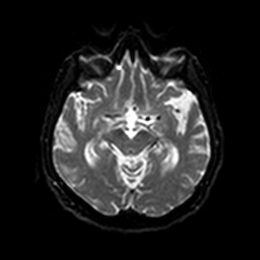
[im 58/96]
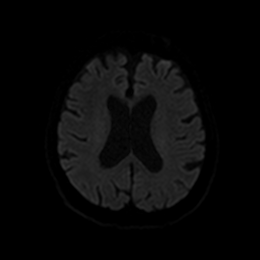
[im 77/96]
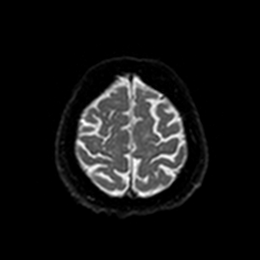
[im 96/96]
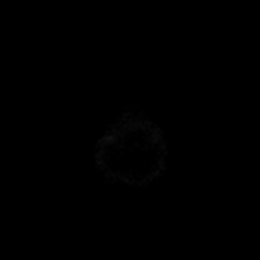

[Series 6: DWI · axial · 3.0mm · 0.88mm/px · z∈[-100,+39]mm · 4 of 48 slices shown (2 of 4)]
[im 1/48]
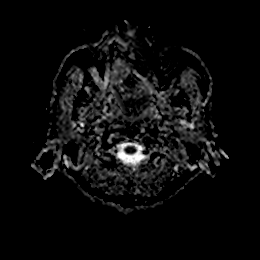
[im 16/48]
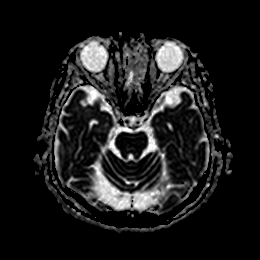
[im 32/48]
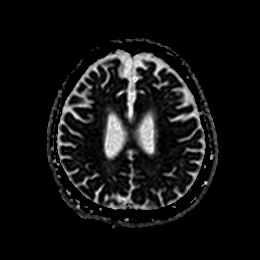
[im 48/48]
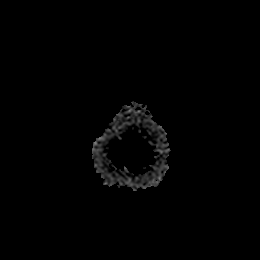

[Series 7: DWI · coronal · 4.0mm · 0.88mm/px · 5 of 64 slices shown (3 of 4)]
[im 1/64]
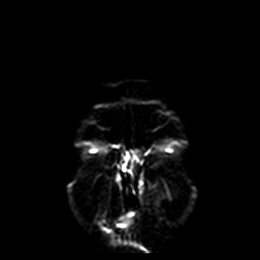
[im 16/64]
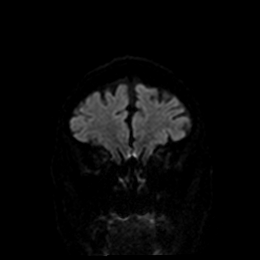
[im 32/64]
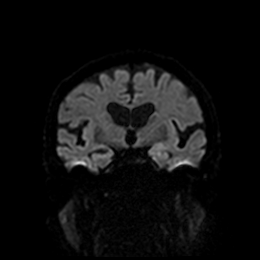
[im 48/64]
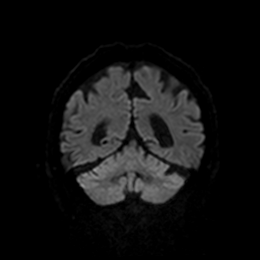
[im 64/64]
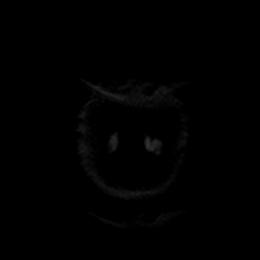

[Series 8: DWI · coronal · 4.0mm · 0.88mm/px · 2 of 31 slices shown (4 of 4)]
[im 1/31]
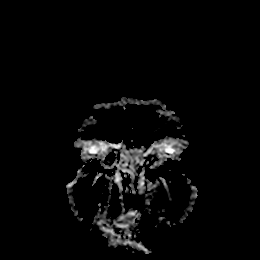
[im 31/31]
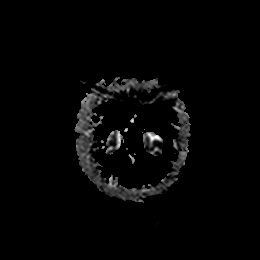

[Series 9: T1 · sagittal · 5.0mm · 0.75mm/px · 2 of 23 slices shown]
[im 1/23]
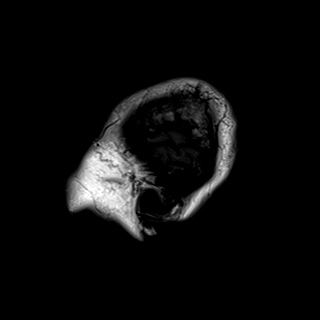
[im 23/23]
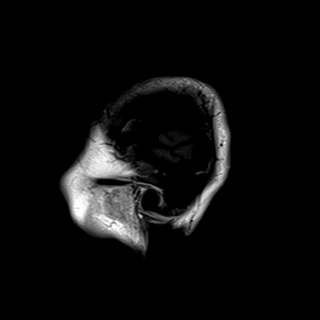

[Series 10: T2 · axial · 5.0mm · 0.72mm/px · z∈[-108,+34]mm · 2 of 25 slices shown (1 of 2)]
[im 1/25]
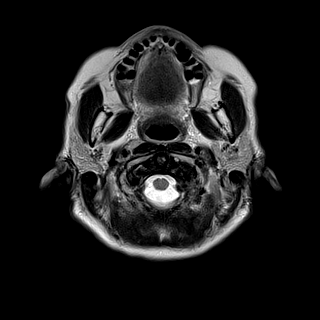
[im 25/25]
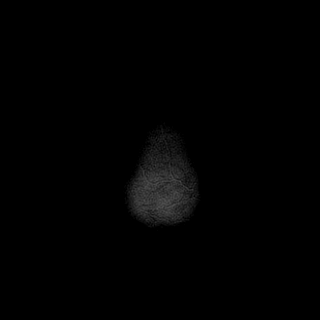

[Series 11: FLAIR · axial · 5.0mm · 0.45mm/px · z∈[-110,+32]mm · 2 of 25 slices shown]
[im 1/25]
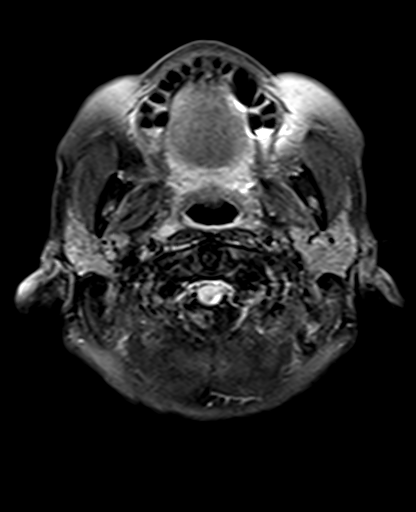
[im 25/25]
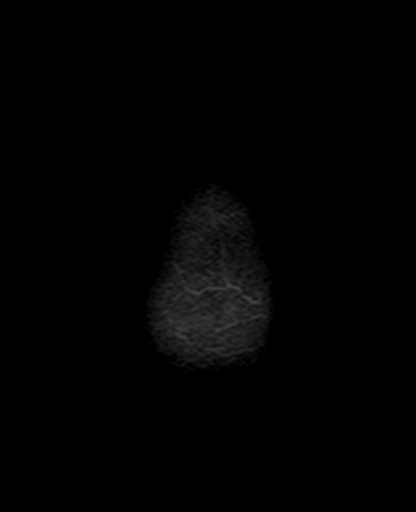

[Series 12: mag_images · axial · 3.0mm · 0.90mm/px · z∈[-126,+48]mm · 5 of 60 slices shown]
[im 1/60]
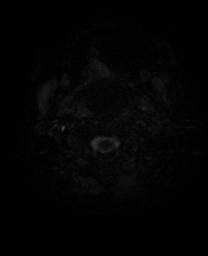
[im 15/60]
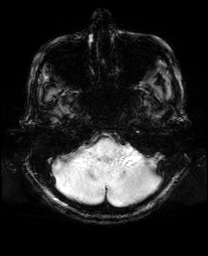
[im 30/60]
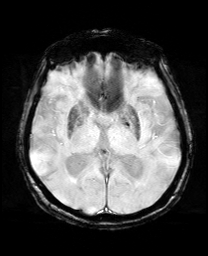
[im 45/60]
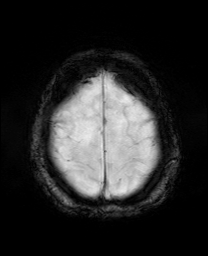
[im 60/60]
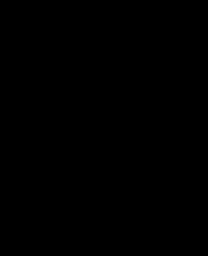

[Series 13: pha_images · axial · 3.0mm · 0.90mm/px · z∈[-126,+48]mm · 4 of 56 slices shown]
[im 1/56]
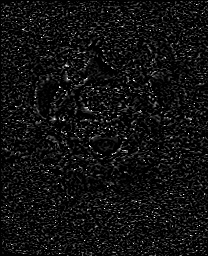
[im 19/56]
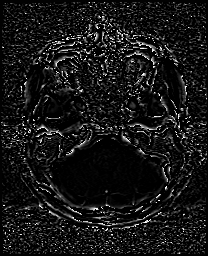
[im 37/56]
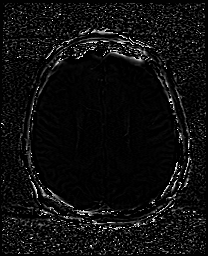
[im 56/56]
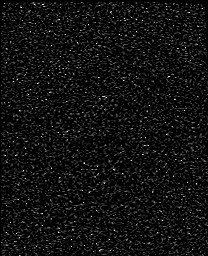

[Series 14: swi_images · axial · 3.0mm · 0.90mm/px · z∈[-126,+48]mm · 5 of 60 slices shown]
[im 1/60]
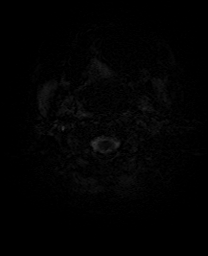
[im 15/60]
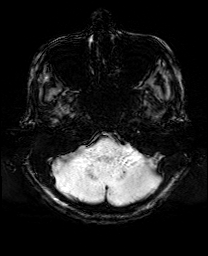
[im 30/60]
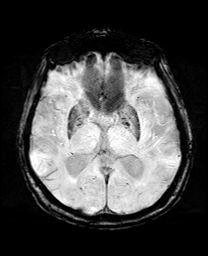
[im 45/60]
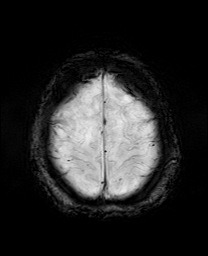
[im 60/60]
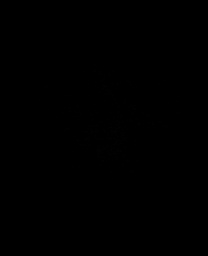

[Series 15: mip_images(sw) · axial · 24.0mm · 0.90mm/px · z∈[-116,+38]mm · 4 of 53 slices shown]
[im 1/53]
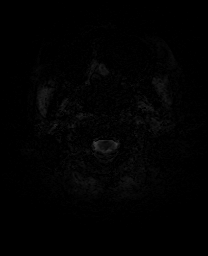
[im 18/53]
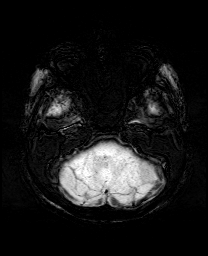
[im 35/53]
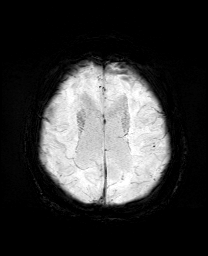
[im 53/53]
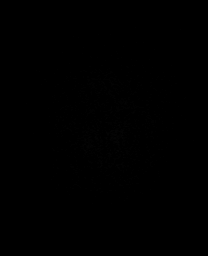

[Series 17: T2 · coronal · 5.0mm · 0.34mm/px · 2 of 29 slices shown (2 of 2)]
[im 1/29]
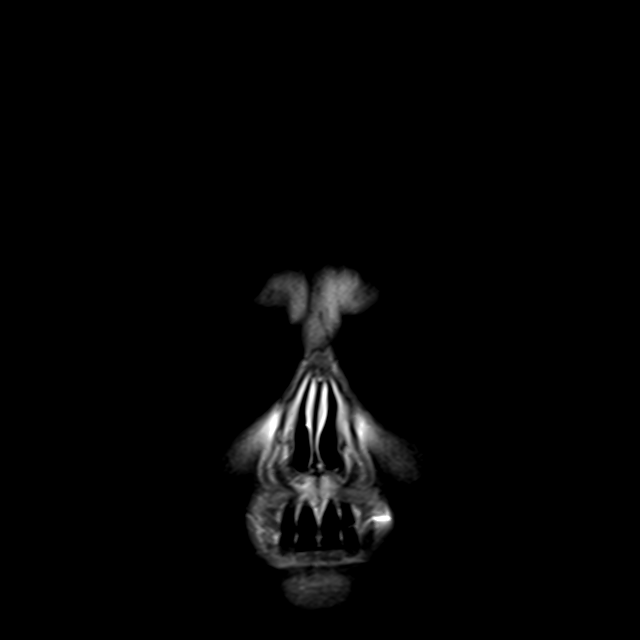
[im 29/29]
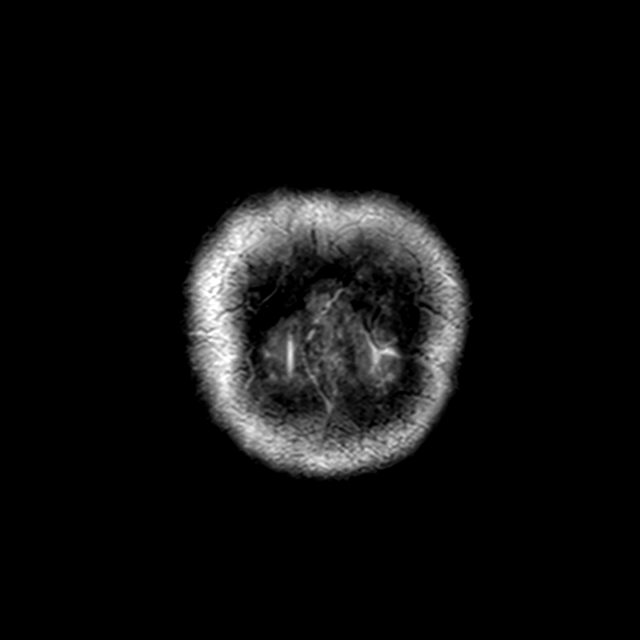

[43 of 48 positions shown; findings below may reference images not displayed]

FINDINGS: Brain: There is no evidence of acute intracranial hemorrhage,
extra-axial fluid collection, or acute infarct.

There is mild global parenchymal volume loss with prominence of the
ventricular system and extra-axial CSF spaces. The ventricles are
stable in size compared to the prior MRI from 8282. There are patchy
foci of FLAIR signal abnormality in the subcortical and
periventricular white matter likely reflecting sequela of mild
chronic white matter microangiopathy. There is no suspicious
parenchymal signal abnormality. There is a punctate chronic
microhemorrhage in the right occipital lobe periventricular white
matter, nonspecific.

There is no mass lesion.  There is no mass effect or midline shift.

Vascular: Normal flow voids.

Skull and upper cervical spine: Normal marrow signal.

Sinuses/Orbits: The paranasal sinuses are clear. Bilateral lens
implants are in place. The globes and orbits are otherwise
unremarkable.

Other: None.
IMPRESSION: No acute intracranial pathology.

## 2023-05-22 ENCOUNTER — Encounter: Payer: Self-pay | Admitting: Cardiology

## 2023-05-22 ENCOUNTER — Encounter (INDEPENDENT_AMBULATORY_CARE_PROVIDER_SITE_OTHER): Admitting: Ophthalmology

## 2023-05-22 ENCOUNTER — Ambulatory Visit: Payer: Medicare PPO | Attending: Cardiology | Admitting: Cardiology

## 2023-05-22 VITALS — BP 100/60 | HR 59 | Ht 63.0 in | Wt 116.0 lb

## 2023-05-22 DIAGNOSIS — R002 Palpitations: Secondary | ICD-10-CM

## 2023-05-22 DIAGNOSIS — I2510 Atherosclerotic heart disease of native coronary artery without angina pectoris: Secondary | ICD-10-CM

## 2023-05-22 DIAGNOSIS — I10 Essential (primary) hypertension: Secondary | ICD-10-CM

## 2023-05-22 DIAGNOSIS — I050 Rheumatic mitral stenosis: Secondary | ICD-10-CM | POA: Diagnosis not present

## 2023-05-22 DIAGNOSIS — I951 Orthostatic hypotension: Secondary | ICD-10-CM | POA: Diagnosis not present

## 2023-05-22 DIAGNOSIS — I350 Nonrheumatic aortic (valve) stenosis: Secondary | ICD-10-CM | POA: Diagnosis not present

## 2023-05-22 DIAGNOSIS — R0609 Other forms of dyspnea: Secondary | ICD-10-CM | POA: Diagnosis not present

## 2023-05-22 MED ORDER — METOPROLOL SUCCINATE ER 25 MG PO TB24: 25.0000 mg | ORAL_TABLET | Freq: Every evening | ORAL | Status: DC

## 2023-05-22 MED ORDER — MIDODRINE HCL 2.5 MG PO TABS: 2.5000 mg | ORAL_TABLET | ORAL | Status: AC | PRN

## 2023-05-22 NOTE — Patient Instructions (Signed)
 Medication Instructions:    Metoprolol    - wean off taking medication for the next 2 weeks  take 1/2 tablet at bedtime.  The stop taking Metoprolol  all together.    Only use Midodrine  as needed if your dizzy or blood pressure is low  - Under 100 ( top number)    *If you need a refill on your cardiac medications before your next appointment, please call your pharmacy*   Lab Work:   Not needed    Testing/Procedures:  Needs to be schedule in June 2025 Your physician has requested that you have an echocardiogram. Echocardiography is a painless test that uses sound waves to create images of your heart. It provides your doctor with information about the size and shape of your heart and how well your heart's chambers and valves are working. This procedure takes approximately one hour. There are no restrictions for this procedure. Please do NOT wear cologne, perfume, aftershave, or lotions (deodorant is allowed). Please arrive 15 minutes prior to your appointment time.  Please note: We ask at that you not bring children with you during ultrasound (echo/ vascular) testing. Due to room size and safety concerns, children are not allowed in the ultrasound rooms during exams. Our front office staff cannot provide observation of children in our lobby area while testing is being conducted. An adult accompanying a patient to their appointment will only be allowed in the ultrasound room at the discretion of the ultrasound technician under special circumstances. We apologize for any inconvenience.   Follow-Up: At [facility], you and your health needs are our priority.  As part of our continuing mission to provide you with exceptional heart care, we have created designated Provider Care Teams.  These Care Teams include your primary Cardiologist (physician) and Advanced Practice Providers (APPs -  Physician Assistants and Nurse Practitioners) who all work together to provide you with the care you need,  when you need it.  We recommend signing up for the patient portal called "MyChart".  Sign up information is provided on this After Visit Summary.  MyChart is used to connect with patients for Virtual Visits (Telemedicine).  Patients are able to view lab/test results, encounter notes, upcoming appointments, etc.  Non-urgent messages can be sent to your provider as well.   To learn more about what you can do with MyChart, go to [email or web address].    Your next appointment:   2 month(s)  The format for your next appointment:   In Person  Provider:   Randene Bustard, MD

## 2023-05-22 NOTE — Progress Notes (Signed)
 Cardiology Office Note:  .   Date:  05/27/2023  ID:  [name], DOB 1927-12-22, MRN [id number] PCP: Tena Feeling, MD  [facility] Providers Cardiologist:  Randene Bustard, MD     Chief Complaint  Patient presents with   Follow-up    Very short of breath   Cardiac Valve Problem    Mild to moderate aortic stenosis    Patient Profile: .     [name] is a very pleasant  89 y.o. female with a PMH notable for labile HTN, HLD & Moderate Aortic Stenosis along with prior DVT and palpitation complaint who presents here for 72-month follow-up at the request of Tena Feeling, MD.     I last saw [name] back in July 2024 with continued complaints of dyspnea and palpitations that were relatively controlled.  She had been to the ER for near syncope/syncope.  She was having low blood pressure issues for which she was started midodrine  2.5 mg daily.  We continued her low-dose Toprol  12.5 mg qPM & started midodrine  2.5 mg q AM.  Recommend ensuring adequate hydration at least 70 ounces of water  per day, foot elevation for least 20 minutes 3-4 times a day and recommended support stockings.  Dizziness or syncope thought to be related to dysautonomia vasovagal symptoms as well as making sure that she is drinking well.  She has been seen 3 times since that visit by APP's.   Sharren Decree, [place] 11/08/2022: No recurrent syncopal episodes but did note some occasional lightheadedness and dizziness.  Having issues with respiratory concerns.  She stated this is dated back to her COVID infection.  Waxing and waning dyspnea.  Notes getting dizzy and lightheaded after getting dyspneic.  Stable edema. Katlyn West, NP NP 01/17/2023 noting worsening dyspnea and dizziness.  Notably dyspneic and referred to the ER.  BNP was mildly elevated.  Troponin negative.  Lasix  increased for 3 days.  Question long COVID.  Most recently on February 4 by Sharren Decree, PA -> noting continued exertional dyspnea that  waxes and wanes.  Having good days and bad days.  Improved from the previous visit.  Some palpitations.  Some atypical sounding chest pain.  She had declined vestibular therapy for possible vertigo.  Having some issues with dysphagia.  Appeared to be euvolemic.  Felt that dyspnea was likely not cardiac in nature.  Deferred further evaluation.  Subjective  Discussed the use of AI scribe software for clinical note transcription with the patient, who gave verbal consent to proceed.  History of Present Illness History of Present Illness [name] is a 89 year old female with moderate aortic stenosis who presents with shortness of breath and blood pressure fluctuations.  She was quite worn out with having to walk from the parking deck all the way to the clinic room.  Very upset.  She also seems to be a bit confused about her medications.  She experiences significant shortness of breath, particularly with exertion such as walking or performing household chores. Even minimal exertion can lead to severe breathlessness, requiring her to sit down to avoid syncope. She recalls a previous incident in June of last year when she experienced syncope and was hospitalized for five days due to hypotension. No chest tightness, pressure, or pain is reported. She experiences occasional palpitations and tachycardia, particularly with exertion, but notes improvement in her breathing.  Her blood pressure fluctuates, with episodes of both hypertension and hypotension. She takes metoprolol   at night and midodrine  in the morning. However, she stopped taking midodrine  as it caused hypotension. Her blood pressure was recorded at 139 mmHg in the morning before breakfast, which she considered a good reading.  She has a history of a heart murmur and small artery disease. A coronary CT angiogram in November 2023 showed mild nonobstructive plaque with a calcium  score of 255, but no significant blockages. There is some calcification  in her heart valves, leading to moderate aortic stenosis.  She reports occasional peripheral edema, which has improved. She has not been using her diuretic medication, Lasix , as the edema has decreased. She uses an inhaler and Spiriva  for dyspnea, which she attributes to potential pulmonary issues post-COVID, although her lungs were found to be clear. Prior to COVID, she was able to walk two miles daily, but has since experienced a decline in her physical capabilities, attributing some symptoms to lingering effects of COVID on her lungs.  Cardiovascular ROS: positive for - dyspnea on exertion, irregular heartbeat, palpitations, shortness of breath, and positional dizziness and fatigue. negative for - chest pain, orthopnea, paroxysmal nocturnal dyspnea, rapid heart rate, or syncope.  TIA or amaurosis fugax no claudication  ROS:  Review of Systems - Negative except symptoms noted above    Objective   Current Meds  Medication Sig   acetaminophen  (TYLENOL ) 500 MG tablet Take 500-1,000 mg by mouth every 8 (eight) hours as needed for mild pain.    albuterol  (VENTOLIN  HFA) 108 (90 Base) MCG/ACT inhaler Inhale 2 puffs into the lungs every 4 (four) hours as needed for wheezing or shortness of breath.   alendronate  (FOSAMAX ) 70 MG tablet Take 1 tablet by mouth once a week.   Cholecalciferol  (VITAMIN D -3) 25 MCG (1000 UT) CAPS Take 1,000 Units by mouth daily with breakfast.    famotidine (PEPCID) 40 MG tablet Take 40 mg by mouth daily.   Polyvinyl Alcohol-Povidone (REFRESH OP) Place 1 drop into both eyes daily as needed (dry eyes).   protein supplement shake (PREMIER PROTEIN) LIQD Take 59.1 mLs (2 oz total) by mouth 3 (three) times daily between meals. (Patient taking differently: Take 2 oz by mouth 3 (three) times daily between meals. BOOST)   senna-docusate (SENOKOT-S) 8.6-50 MG tablet Take 2 tablets by mouth at bedtime as needed for mild constipation.   Tiotropium Bromide Monohydrate  (SPIRIVA   RESPIMAT) 1.25 MCG/ACT AERS Inhale 2 puffs into the lungs daily.   vitamin B-12 (CYANOCOBALAMIN ) 100 MCG tablet Take 100 mcg by mouth daily.   []  metoprolol  succinate (TOPROL -XL) 25 MG 24 hr tablet Take 1 tablet (25 mg total) by mouth every evening. Kapsp[place]  sprinkle    Studies Reviewed: Aaron Aas   EKG Interpretation Date/Time:  Monday May 22 2023 14:32:46 EDT Ventricular Rate:  59 PR Interval:  146 QRS Duration:  110 QT Interval:  414 QTC Calculation: 409 R Axis:   -21  Text Interpretation: Sinus bradycardia with sinus arrhythmia Right bundle branch block Minimal voltage criteria for LVH, may be normal variant ( R in aVL ) When compared with ECG of 17-Jan-2023 16:38, No significant change was found Confirmed by Randene Bustard ([id number]) on 05/22/2023 2:48:16 PM    Lab Results  Component Value Date   NA 136 01/17/2023   K 4.2 01/17/2023   CREATININE 1.19 (H) 01/17/2023   GFRNONAA 43 (L) 01/17/2023   GLUCOSE 90 01/17/2023   Lab Results  Component Value Date   CHOL 214 (H) 10/20/2021   HDL 81 10/20/2021   LDLCALC 129 (H)  10/20/2021   TRIG 20 10/20/2021   CHOLHDL 2.6 10/20/2021    Zio patch June 2024: Predominantly sinus rhythm rate range 48 to 146 bpm and average 79 bpm.  2.2% PVCs.  2 short bursts of PVCs and 21 atrial runs but no sustained arrhythmias.  ECHO 06/23/2022: EF 65 to 70%.  No RWMA.  GR 1 DD.  Normal RV.  Small pericardial effusion.  Mild MS.  Moderate MAC.  Moderate AS.  Cor CTA 12/13/2021:  1. Mild mixed non-obstructive CAD, CADRADS = 2.   2. Coronary calcium  score of 255. This was 73rd percentile for age and sex matched control (based on an 89 yo).  3. Normal coronary origin with right dominance.   4. Aortic and mitral annular calcification.  5. Aortic atherosclerosis  Risk Assessment/Calculations:         Physical Exam:   VS:  BP 100/60   Pulse (!) 59   Ht 5\' 3"  (1.6 m)   Wt 116 lb (52.6 kg)   SpO2 99%   BMI 20.55 kg/m    Wt Readings from Last 3 Encounters:   05/22/23 116 lb (52.6 kg)  02/21/23 120 lb (54.4 kg)  01/17/23 118 lb (53.5 kg)    GEN: Well nourished, well groomed; in no acute distress; somewhat upset about the long walk but healthy-appearing. NECK: No JVD; No carotid bruits CARDIAC: RRR, Normal S1, S2; 2/6 SEM at RUSB.  No rubs, gallops RESPIRATORY:  Clear to auscultation without rales, wheezing or rhonchi ; nonlabored, good air movement. ABDOMEN: Soft, non-tender, non-distended EXTREMITIES:  No edema; No deformity     ASSESSMENT AND PLAN: .    Problem List Items Addressed This Visit       Cardiology Problems   Aortic stenosis, moderate (Chronic)   Aortic stenosis read as moderate on last Echo in June 2024. - With her having exertional dyspnea now that is worse, will reassess although in the past she had declined consideration for valve therapy.      Relevant Medications   midodrine  (PROAMATINE ) 2.5 MG tablet   metoprolol  succinate (TOPROL -XL) 25 MG 24 hr tablet   Dysautonomia orthostatic hypotension syndrome (Chronic)   Thankfully, no recurrent syncopal episodes. Continue to encourage adequate hydration and nutrition.  I see that she is using meal supplements.  Encouraged continued use. -Because of fatigue issues and lack of palpitations we will wean off metoprolol  succinate -Continue meclizine  2.5 mg every morning - Monitor blood pressure regularly => allow permissive hypertension      Relevant Medications   midodrine  (PROAMATINE ) 2.5 MG tablet   metoprolol  succinate (TOPROL -XL) 25 MG 24 hr tablet   Essential hypertension (Chronic)   No longer an issue. Weaning off beta-blocker.      Relevant Medications   midodrine  (PROAMATINE ) 2.5 MG tablet   metoprolol  succinate (TOPROL -XL) 25 MG 24 hr tablet     Other   DOE (dyspnea on exertion)   Not clearly true etiology of this although there was concern for possible aortic stenosis on last echo.  Will reassess with 2D echo.  I suspect that a lot of her exertional  dyspnea is related to deconditioning but potentially post-COVID syndrome. Inhalers provide symptomatic relief. - Continue use of albuterol  and Spiriva  inhalers as needed for dyspnea. - Recheck 2D echo      Palpitations (Chronic)   Not overly symptomatic.  Less concerning than her orthostasis.  Will stop beta-blocker.      Other Visit Diagnoses  Coronary artery disease involving native coronary artery of native heart without angina pectoris    -  Primary   Relevant Medications   midodrine  (PROAMATINE ) 2.5 MG tablet   metoprolol  succinate (TOPROL -XL) 25 MG 24 hr tablet   Other Relevant Orders   EKG 12-Lead (Completed)   ECHOCARDIOGRAM COMPLETE     Mild mitral stenosis       Relevant Medications   midodrine  (PROAMATINE ) 2.5 MG tablet   metoprolol  succinate (TOPROL -XL) 25 MG 24 hr tablet   Other Relevant Orders   ECHOCARDIOGRAM COMPLETE     Moderate aortic stenosis       Relevant Medications   midodrine  (PROAMATINE ) 2.5 MG tablet   metoprolol  succinate (TOPROL -XL) 25 MG 24 hr tablet   Other Relevant Orders   ECHOCARDIOGRAM COMPLETE             Follow-Up: Return in about 2 months (around 07/22/2023) for Routine follow up with me, [employer].  Recording duration: 25 minutes Total time spent: 25 min spent with patient + 25 min spent charting = 50 min I spent 50 minutes in the care of Corinne P Melberg today including reviewing labs (1 minute), reviewing studies (5 minutes reviewing previous studies including Echo, Coronary CTA, and monitor), face to face time discussing treatment options (25 minutes), reviewing records from APP visit x 3 (8 minutes), 11 minutes dictating, and documenting in the encounter.     Signed, Arleen Lacer, MD, MS Randene Bustard, M.D., M.S. Interventional Chartered certified accountant  Pager # [phone]

## 2023-05-23 ENCOUNTER — Encounter (INDEPENDENT_AMBULATORY_CARE_PROVIDER_SITE_OTHER): Admitting: Ophthalmology

## 2023-05-23 DIAGNOSIS — H43813 Vitreous degeneration, bilateral: Secondary | ICD-10-CM | POA: Diagnosis not present

## 2023-05-23 DIAGNOSIS — I10 Essential (primary) hypertension: Secondary | ICD-10-CM | POA: Diagnosis not present

## 2023-05-23 DIAGNOSIS — H353231 Exudative age-related macular degeneration, bilateral, with active choroidal neovascularization: Secondary | ICD-10-CM | POA: Diagnosis not present

## 2023-05-23 DIAGNOSIS — H35033 Hypertensive retinopathy, bilateral: Secondary | ICD-10-CM | POA: Diagnosis not present

## 2023-05-27 ENCOUNTER — Encounter: Payer: Self-pay | Admitting: Cardiology

## 2023-05-27 NOTE — Assessment & Plan Note (Signed)
 Not overly symptomatic.  Less concerning than her orthostasis.  Will stop beta-blocker.

## 2023-05-27 NOTE — Assessment & Plan Note (Addendum)
 Not clearly true etiology of this although there was concern for possible aortic stenosis on last echo.  Will reassess with 2D echo.  I suspect that a lot of her exertional dyspnea is related to deconditioning but potentially post-COVID syndrome. Inhalers provide symptomatic relief. - Continue use of albuterol  and Spiriva  inhalers as needed for dyspnea. - Recheck 2D echo

## 2023-05-27 NOTE — Assessment & Plan Note (Signed)
 No longer an issue. Weaning off beta-blocker.

## 2023-05-27 NOTE — Assessment & Plan Note (Signed)
 Aortic stenosis read as moderate on last Echo in June 2024. - With her having exertional dyspnea now that is worse, will reassess although in the past she had declined consideration for valve therapy.

## 2023-05-27 NOTE — Assessment & Plan Note (Signed)
 Thankfully, no recurrent syncopal episodes. Continue to encourage adequate hydration and nutrition.  I see that she is using meal supplements.  Encouraged continued use. -Because of fatigue issues and lack of palpitations we will wean off metoprolol  succinate -Continue meclizine  2.5 mg every morning - Monitor blood pressure regularly => allow permissive hypertension

## 2023-06-05 DIAGNOSIS — N390 Urinary tract infection, site not specified: Secondary | ICD-10-CM | POA: Diagnosis not present

## 2023-06-05 DIAGNOSIS — Z1231 Encounter for screening mammogram for malignant neoplasm of breast: Secondary | ICD-10-CM | POA: Diagnosis not present

## 2023-06-05 DIAGNOSIS — Z01411 Encounter for gynecological examination (general) (routine) with abnormal findings: Secondary | ICD-10-CM | POA: Diagnosis not present

## 2023-06-05 DIAGNOSIS — N3281 Overactive bladder: Secondary | ICD-10-CM | POA: Diagnosis not present

## 2023-06-05 DIAGNOSIS — M810 Age-related osteoporosis without current pathological fracture: Secondary | ICD-10-CM | POA: Diagnosis not present

## 2023-06-19 DIAGNOSIS — H40013 Open angle with borderline findings, low risk, bilateral: Secondary | ICD-10-CM | POA: Diagnosis not present

## 2023-06-20 ENCOUNTER — Encounter (INDEPENDENT_AMBULATORY_CARE_PROVIDER_SITE_OTHER): Admitting: Ophthalmology

## 2023-06-20 DIAGNOSIS — I10 Essential (primary) hypertension: Secondary | ICD-10-CM

## 2023-06-20 DIAGNOSIS — H35033 Hypertensive retinopathy, bilateral: Secondary | ICD-10-CM | POA: Diagnosis not present

## 2023-06-20 DIAGNOSIS — H353231 Exudative age-related macular degeneration, bilateral, with active choroidal neovascularization: Secondary | ICD-10-CM | POA: Diagnosis not present

## 2023-06-20 DIAGNOSIS — H43813 Vitreous degeneration, bilateral: Secondary | ICD-10-CM

## 2023-06-26 ENCOUNTER — Ambulatory Visit (HOSPITAL_COMMUNITY)
Admission: RE | Admit: 2023-06-26 | Discharge: 2023-06-26 | Disposition: A | Discharge status: Home / Self Care | Source: Ambulatory Visit | Attending: Cardiovascular Disease | Admitting: Cardiovascular Disease

## 2023-06-26 DIAGNOSIS — I050 Rheumatic mitral stenosis: Secondary | ICD-10-CM

## 2023-06-26 DIAGNOSIS — I2510 Atherosclerotic heart disease of native coronary artery without angina pectoris: Secondary | ICD-10-CM | POA: Diagnosis not present

## 2023-06-26 DIAGNOSIS — I350 Nonrheumatic aortic (valve) stenosis: Secondary | ICD-10-CM | POA: Diagnosis not present

## 2023-06-26 LAB — ECHOCARDIOGRAM COMPLETE
AR max vel: 0.88 cm2
AV Area VTI: 0.93 cm2
AV Area mean vel: 0.87 cm2
AV Mean grad: 17 mmHg
AV Peak grad: 29.4 mmHg
Ao pk vel: 2.71 m/s
Area-P 1/2: 2.22 cm2
MV VTI: 1.3 cm2
P 1/2 time: 316 ms
S' Lateral: 1.6 cm

## 2023-06-28 DIAGNOSIS — B9620 Unspecified Escherichia coli [E. coli] as the cause of diseases classified elsewhere: Secondary | ICD-10-CM | POA: Diagnosis not present

## 2023-06-28 DIAGNOSIS — N390 Urinary tract infection, site not specified: Secondary | ICD-10-CM | POA: Diagnosis not present

## 2023-06-28 DIAGNOSIS — N3281 Overactive bladder: Secondary | ICD-10-CM | POA: Diagnosis not present

## 2023-06-30 ENCOUNTER — Ambulatory Visit: Payer: Self-pay | Admitting: Cardiology

## 2023-06-30 NOTE — Telephone Encounter (Signed)
-----   Message from Randene Bustard sent at 06/30/2023  3:50 PM EDT ----- Transthoracic echo shows normal left ventricular pump function with an ejection fraction of 60 to 65%.  Normal ranges 50-70).  There are no wall motion abnormalities to suggest any prior history of  heart attack. The left ventricle is moderately thickened which goes along with having high blood pressure.  However there is only mild dilation of the left atrium.  The gradients across the aortic valve appear to be less significant than before.  Suggestive of moderate narrowing/stenosis.  Only mild mitral regurgitation.  Overall stable to improved findings.  Not really much from this thickened show was a reason for shortness of breath besides a little bit of abnormal relaxation which is pretty normal for a 89 year old.  With having relatively low blood  pressures, I do not think there is much more that we can do about this, since the treatment is blood pressure control.Randene Bustard, MD  ----- Message ----- From: Interface, [employer] Sent: 06/26/2023   3:26 PM EDT To: Arleen Lacer, MD

## 2023-06-30 NOTE — Telephone Encounter (Signed)
Called/left message for patient to call back for results.

## 2023-07-03 ENCOUNTER — Telehealth: Payer: Self-pay | Admitting: Cardiology

## 2023-07-03 NOTE — Telephone Encounter (Signed)
Patient returned staff call. 

## 2023-07-03 NOTE — Telephone Encounter (Signed)
 Patient was returning call. Please advise ?

## 2023-07-03 NOTE — Telephone Encounter (Signed)
 Patient identification verified by 2 forms. Sims Duck, RN     Called and spoke to patient  Relayed provider recommendations  Patient aware:   RN    06/30/23  3:50 PM Result Note Transthoracic echo shows normal left ventricular pump function with an ejection fraction of 60 to 65%.  Normal ranges 50-70).  There are no wall motion abnormalities to suggest any prior history of heart attack. The left ventricle is moderately thickened which goes along with having high blood pressure.  However there is only mild dilation of the left atrium.   The gradients across the aortic valve appear to be less significant than before.  Suggestive of moderate narrowing/stenosis.  Only mild mitral regurgitation.   Overall stable to improved findings.   Not really much from this thickened show was a reason for shortness of breath besides a little bit of abnormal relaxation which is pretty normal for a 89 year old.  With having relatively low blood pressures, I do not think there is much more that we can do about this, since the treatment is blood pressure control.Randene Bustard, MD   ECHOCARDIOGRAM COMPLETE   Patient verbalized understanding, no questions at this time

## 2023-07-03 NOTE — Telephone Encounter (Signed)
 Patient identification verified by 2 forms. Sims Duck, RN     Called patient. No answer. LVMTCB

## 2023-07-04 NOTE — Telephone Encounter (Signed)
 Patient  returned call 07/03/23. The results were given to her by triage RN. Per RN, patient verbalized understanding.

## 2023-07-11 DIAGNOSIS — R519 Headache, unspecified: Secondary | ICD-10-CM | POA: Diagnosis not present

## 2023-07-11 DIAGNOSIS — J029 Acute pharyngitis, unspecified: Secondary | ICD-10-CM | POA: Diagnosis not present

## 2023-07-18 ENCOUNTER — Encounter (INDEPENDENT_AMBULATORY_CARE_PROVIDER_SITE_OTHER): Admitting: Ophthalmology

## 2023-07-18 DIAGNOSIS — H353231 Exudative age-related macular degeneration, bilateral, with active choroidal neovascularization: Secondary | ICD-10-CM

## 2023-07-18 DIAGNOSIS — H43813 Vitreous degeneration, bilateral: Secondary | ICD-10-CM | POA: Diagnosis not present

## 2023-07-18 DIAGNOSIS — I10 Essential (primary) hypertension: Secondary | ICD-10-CM | POA: Diagnosis not present

## 2023-07-18 DIAGNOSIS — H35033 Hypertensive retinopathy, bilateral: Secondary | ICD-10-CM

## 2023-07-26 ENCOUNTER — Ambulatory Visit: Attending: Cardiovascular Disease | Admitting: Cardiology

## 2023-07-26 ENCOUNTER — Encounter: Payer: Self-pay | Admitting: Cardiology

## 2023-07-26 VITALS — BP 120/66 | HR 70 | Ht 63.0 in | Wt 116.0 lb

## 2023-07-26 DIAGNOSIS — R29898 Other symptoms and signs involving the musculoskeletal system: Secondary | ICD-10-CM

## 2023-07-26 DIAGNOSIS — I951 Orthostatic hypotension: Secondary | ICD-10-CM

## 2023-07-26 DIAGNOSIS — R0609 Other forms of dyspnea: Secondary | ICD-10-CM

## 2023-07-26 DIAGNOSIS — R42 Dizziness and giddiness: Secondary | ICD-10-CM

## 2023-07-26 DIAGNOSIS — N1831 Chronic kidney disease, stage 3a: Secondary | ICD-10-CM

## 2023-07-26 DIAGNOSIS — I10 Essential (primary) hypertension: Secondary | ICD-10-CM

## 2023-07-26 DIAGNOSIS — I350 Nonrheumatic aortic (valve) stenosis: Secondary | ICD-10-CM

## 2023-07-26 NOTE — Progress Notes (Signed)
 Cardiology Office Note:  .   Date:  08/02/2023  ID:  [name], DOB August 18, 1927, MRN [id number] PCP: Dwight Trula SHAUNNA, MD  [facility] Providers Cardiologist:  Alm Clay, MD     Chief Complaint  Patient presents with   Follow-up    Still has shortness of breath, but better.  Here to discuss echo results.    Patient Profile: .     [name] is a very pleasant 89 y.o. female  with a PMH notable for HTN, HLD and moderate AS with prior DVT who presents here for 33-month follow-up to discuss Echo results.  She was recently here for 35-month follow-up at the request of Dwight Trula SHAUNNA, MD.        [name] was last seen on 05/22/2023-she was quite upset because of having walked a long way from the parking lot into the office and all the way down to the clinic room.  She is extremely worn out tired and upset.  More dyspneic than usual and noted blood pressure fluctuations.  She noted exertional dyspnea with even minimal exertion occasional palpitations.  Occasional edema but also positional dizziness and fatigue. We ordered 2D echo to reassess aortic valve.  Continue to midodrine  for dysautonomia and plan to wean off of Toprol  allowing for permissive hypertension.  Subjective  Discussed the use of AI scribe software for clinical note transcription with the patient, who gave verbal consent to proceed.  History of Present Illness History of Present Illness [name] is a 89 year old female with aortic valve stenosis who presents with shortness of breath.  She experiences shortness of breath, particularly during exertion such as walking or doing housework. She becomes winded even with minimal activity, like walking from the car to the elevator. No chest tightness or pressure accompanies the shortness of breath.  She has a history of COVID-19 in 2021, after which she noticed a decline in her physical condition. Before COVID-19, she was able to walk two miles every morning,  but now she becomes breathless with much less activity.  She has a history of aortic valve stenosis, with a recent echocardiogram showing a gradient of 17.  Her current medication regimen includes taking a half dose of metoprolol  (Toprol ) at night and another in the morning to manage her blood pressure. She has not completely stopped it yet.  She reports fluctuating weight and describes her diet as consisting of two meals a day with snacks in between, including fruit and yogurt. She also drinks Boost and premium protein drinks. She is concerned about her weight loss but states she eats adequately.  No dizziness, lightheadedness, or passing out.  Cardiovascular ROS: positive for - dyspnea on exertion, palpitations, shortness of breath, and just feels worn out easily.  More fatigued and exertional dyspnea. negative for - chest pain, edema, orthopnea, paroxysmal nocturnal dyspnea, rapid heart rate, or syncope or near syncope or TIA or emesis fugax.  ROS:  Review of Systems - Negative except symptoms noted in HPI.    Objective    Studies Reviewed: .        Results  Lab Results  Component Value Date   NA 136 01/17/2023   K 4.2 01/17/2023   CREATININE 1.19 (H) 01/17/2023   GFRNONAA 43 (L) 01/17/2023   GLUCOSE 90 01/17/2023    RADIOLOGY CT Angiogram: No significant findings (2023)  DIAGNOSTIC Echocardiogram: Ejection fraction 60-65%, no wall motion abnormalities, mild-moderate Aortic Valve Stenosis with gradient 17 mmHg (  07/12/2023).  Risk Assessment/Calculations:          Physical Exam:   VS:  BP 120/66 (BP Location: Left Arm, Patient Position: Sitting, Cuff Size: Normal)   Pulse 70   Ht 5' 3 (1.6 m)   Wt 116 lb (52.6 kg)   SpO2 99%   BMI 20.55 kg/m    Wt Readings from Last 3 Encounters:  07/26/23 116 lb (52.6 kg)  05/22/23 116 lb (52.6 kg)  02/21/23 120 lb (54.4 kg)    GEN: Well nourished, well groomed in no acute distress; still relatively healthy appearing.   Less anxious today. NECK: No JVD; No carotid bruits CARDIAC: Normal S1, S2; RRR, 2/6 SEM at RUSB.  Otherwise no rubs, gallops RESPIRATORY:  Clear to auscultation without rales, wheezing or rhonchi ; nonlabored, good air movement. ABDOMEN: Soft, non-tender, non-distended EXTREMITIES:  No edema; No deformity      ASSESSMENT AND PLAN: .    Problem List Items Addressed This Visit       Cardiology Problems   Aortic stenosis, moderate (Chronic)   Echo showed mean gradient of 17 mmHg, now in the mild to moderate range, still not significant.  Would probably wait another couple years to determine whether or not we restart.      Dysautonomia orthostatic hypotension syndrome - Primary (Chronic)   No recurrent syncope. Feels little better with the cessation of Toprol .  Energy level improved somewhat Plan is to allow for permissive hypertension (blood pressure range 130 to 160 mmHg) - Continue midodrine  for dizziness.  Usually taking it once a day. - Encourage adequate hydration      Essential hypertension (Chronic)   Double edged sword with her positional dizziness and hypotension. No longer on Toprol , and requiring midodrine  to keep blood pressures up.  No additional therapy.         Other   Chronic kidney disease, stage 3a (HCC)   Most recent creatinine is 1.19.  Stable.      Deconditioned low back   Deconditioning post-COVID-19 infection contributing to dyspnea. Recovery prolonged in older adults. - Encourage gradual increase in physical activity.      Dizziness, nonspecific   Using midodrine  to help out with orthostatic hypotension.      DOE (dyspnea on exertion)   Likely multifactorial.  Needs room to get used to current level of endurance.  Echo does not explain reasons for dyspnea, she is not having any anginal symptoms.  Not likely related to aortic stenosis.  Intermittent exertional dyspnea likely due to post-COVID-19 deconditioning. Echocardiogram and CT  angiogram show no significant cardiac issues. Stress test deferred due to lack of interest in invasive procedures. - Encourage gradual increase in physical activity. - Consider stress test if symptoms worsen or if further evaluation is desired.               Follow-Up: Return in about 6 months (around 01/26/2024).     Signed, Alm MICAEL Clay, MD, MS Alm Clay, M.D., M.S. Interventional Chartered certified accountant  Pager # [phone]

## 2023-07-26 NOTE — Patient Instructions (Signed)
 Medication Instructions:   No changes *If you need a refill on your cardiac medications before your next appointment, please call your pharmacy*   Lab Work: Not needed    Testing/Procedures: Not needed   contact the office if you decide on having a stress test done , due you increase shortness of breath.   Follow-Up: At [facility], you and your health needs are our priority.  As part of our continuing mission to provide you with exceptional heart care, we have created designated Provider Care Teams.  These Care Teams include your primary Cardiologist (physician) and Advanced Practice Providers (APPs -  Physician Assistants and Nurse Practitioners) who all work together to provide you with the care you need, when you need it.     Your next appointment:   6 month(s)  The format for your next appointment:   In Person  Provider:   Alm Clay, MD   Other Instructions

## 2023-08-02 ENCOUNTER — Encounter: Payer: Self-pay | Admitting: Cardiology

## 2023-08-02 DIAGNOSIS — R29898 Other symptoms and signs involving the musculoskeletal system: Secondary | ICD-10-CM | POA: Insufficient documentation

## 2023-08-02 NOTE — Assessment & Plan Note (Signed)
 Double edged sword with her positional dizziness and hypotension. No longer on Toprol , and requiring midodrine  to keep blood pressures up.  No additional therapy.

## 2023-08-02 NOTE — Assessment & Plan Note (Signed)
 Echo showed mean gradient of 17 mmHg, now in the mild to moderate range, still not significant.  Would probably wait another couple years to determine whether or not we restart.

## 2023-08-02 NOTE — Assessment & Plan Note (Addendum)
 Likely multifactorial.  Needs room to get used to current level of endurance.  Echo does not explain reasons for dyspnea, she is not having any anginal symptoms.  Not likely related to aortic stenosis.  Intermittent exertional dyspnea likely due to post-COVID-19 deconditioning. Echocardiogram and CT angiogram show no significant cardiac issues. Stress test deferred due to lack of interest in invasive procedures. - Encourage gradual increase in physical activity. - Consider stress test if symptoms worsen or if further evaluation is desired.

## 2023-08-02 NOTE — Assessment & Plan Note (Signed)
 Using midodrine  to help out with orthostatic hypotension.

## 2023-08-02 NOTE — Assessment & Plan Note (Signed)
 Deconditioning post-COVID-19 infection contributing to dyspnea. Recovery prolonged in older adults. - Encourage gradual increase in physical activity.

## 2023-08-02 NOTE — Assessment & Plan Note (Signed)
 Most recent creatinine is 1.19.  Stable.

## 2023-08-02 NOTE — Assessment & Plan Note (Signed)
 No recurrent syncope. Feels little better with the cessation of Toprol .  Energy level improved somewhat Plan is to allow for permissive hypertension (blood pressure range 130 to 160 mmHg) - Continue midodrine  for dizziness.  Usually taking it once a day. - Encourage adequate hydration

## 2023-08-10 ENCOUNTER — Other Ambulatory Visit (HOSPITAL_BASED_OUTPATIENT_CLINIC_OR_DEPARTMENT_OTHER): Payer: Self-pay | Admitting: Internal Medicine

## 2023-08-10 DIAGNOSIS — R42 Dizziness and giddiness: Secondary | ICD-10-CM

## 2023-08-10 DIAGNOSIS — I10 Essential (primary) hypertension: Secondary | ICD-10-CM | POA: Diagnosis not present

## 2023-08-10 DIAGNOSIS — R519 Headache, unspecified: Secondary | ICD-10-CM | POA: Diagnosis not present

## 2023-08-11 ENCOUNTER — Ambulatory Visit (HOSPITAL_COMMUNITY)
Admission: RE | Admit: 2023-08-11 | Discharge: 2023-08-11 | Disposition: A | Discharge status: Home / Self Care | Source: Ambulatory Visit | Attending: Internal Medicine | Admitting: Internal Medicine

## 2023-08-11 DIAGNOSIS — R42 Dizziness and giddiness: Secondary | ICD-10-CM | POA: Diagnosis not present

## 2023-08-22 ENCOUNTER — Encounter (INDEPENDENT_AMBULATORY_CARE_PROVIDER_SITE_OTHER): Admitting: Ophthalmology

## 2023-08-22 DIAGNOSIS — H43813 Vitreous degeneration, bilateral: Secondary | ICD-10-CM | POA: Diagnosis not present

## 2023-08-22 DIAGNOSIS — H353231 Exudative age-related macular degeneration, bilateral, with active choroidal neovascularization: Secondary | ICD-10-CM

## 2023-08-22 DIAGNOSIS — H35033 Hypertensive retinopathy, bilateral: Secondary | ICD-10-CM | POA: Diagnosis not present

## 2023-08-22 DIAGNOSIS — D3132 Benign neoplasm of left choroid: Secondary | ICD-10-CM

## 2023-08-22 DIAGNOSIS — I10 Essential (primary) hypertension: Secondary | ICD-10-CM | POA: Diagnosis not present

## 2023-09-19 DIAGNOSIS — I10 Essential (primary) hypertension: Secondary | ICD-10-CM | POA: Diagnosis not present

## 2023-09-19 DIAGNOSIS — N1831 Chronic kidney disease, stage 3a: Secondary | ICD-10-CM | POA: Diagnosis not present

## 2023-09-19 DIAGNOSIS — Z87440 Personal history of urinary (tract) infections: Secondary | ICD-10-CM | POA: Diagnosis not present

## 2023-09-21 ENCOUNTER — Ambulatory Visit: Admitting: Obstetrics and Gynecology

## 2023-09-26 ENCOUNTER — Encounter (INDEPENDENT_AMBULATORY_CARE_PROVIDER_SITE_OTHER): Admitting: Ophthalmology

## 2023-09-26 DIAGNOSIS — D3132 Benign neoplasm of left choroid: Secondary | ICD-10-CM | POA: Diagnosis not present

## 2023-09-26 DIAGNOSIS — I10 Essential (primary) hypertension: Secondary | ICD-10-CM | POA: Diagnosis not present

## 2023-09-26 DIAGNOSIS — H43813 Vitreous degeneration, bilateral: Secondary | ICD-10-CM | POA: Diagnosis not present

## 2023-09-26 DIAGNOSIS — H35033 Hypertensive retinopathy, bilateral: Secondary | ICD-10-CM

## 2023-09-26 DIAGNOSIS — H353231 Exudative age-related macular degeneration, bilateral, with active choroidal neovascularization: Secondary | ICD-10-CM

## 2023-10-19 ENCOUNTER — Ambulatory Visit: Admitting: Obstetrics and Gynecology

## 2023-10-19 ENCOUNTER — Encounter: Payer: Self-pay | Admitting: Obstetrics and Gynecology

## 2023-10-19 ENCOUNTER — Other Ambulatory Visit (HOSPITAL_COMMUNITY)
Admission: RE | Admit: 2023-10-19 | Discharge: 2023-10-19 | Disposition: A | Discharge status: Home / Self Care | Source: Other Acute Inpatient Hospital | Attending: Obstetrics and Gynecology | Admitting: Obstetrics and Gynecology

## 2023-10-19 VITALS — BP 160/77 | HR 81 | Ht 61.7 in | Wt 114.4 lb

## 2023-10-19 DIAGNOSIS — K5901 Slow transit constipation: Secondary | ICD-10-CM

## 2023-10-19 DIAGNOSIS — N3946 Mixed incontinence: Secondary | ICD-10-CM

## 2023-10-19 DIAGNOSIS — R350 Frequency of micturition: Secondary | ICD-10-CM | POA: Diagnosis not present

## 2023-10-19 DIAGNOSIS — R319 Hematuria, unspecified: Secondary | ICD-10-CM

## 2023-10-19 DIAGNOSIS — R82998 Other abnormal findings in urine: Secondary | ICD-10-CM | POA: Insufficient documentation

## 2023-10-19 DIAGNOSIS — N3281 Overactive bladder: Secondary | ICD-10-CM | POA: Diagnosis not present

## 2023-10-19 DIAGNOSIS — N952 Postmenopausal atrophic vaginitis: Secondary | ICD-10-CM | POA: Diagnosis not present

## 2023-10-19 LAB — URINALYSIS, ROUTINE W REFLEX MICROSCOPIC
Bilirubin Urine: NEGATIVE
Glucose, UA: NEGATIVE mg/dL
Hgb urine dipstick: NEGATIVE
Ketones, ur: NEGATIVE mg/dL
Nitrite: POSITIVE — AB
Protein, ur: NEGATIVE mg/dL
Specific Gravity, Urine: 1.014 (ref 1.005–1.030)
pH: 6 (ref 5.0–8.0)

## 2023-10-19 LAB — POCT URINALYSIS DIP (CLINITEK)
Bilirubin, UA: NEGATIVE
Blood, UA: NEGATIVE
Glucose, UA: NEGATIVE mg/dL
Ketones, POC UA: NEGATIVE mg/dL
Nitrite, UA: POSITIVE — AB
POC PROTEIN,UA: NEGATIVE
Spec Grav, UA: 1.015 (ref 1.010–1.025)
Urobilinogen, UA: 0.2 U/dL
pH, UA: 7 (ref 5.0–8.0)

## 2023-10-19 MED ORDER — ESTRADIOL 0.1 MG/GM VA CREA
0.5000 g | TOPICAL_CREAM | VAGINAL | 11 refills | Status: AC
Start: 2023-10-19 — End: ?

## 2023-10-19 MED ORDER — VIBEGRON 75 MG PO TABS: 75.0000 mg | ORAL_TABLET | Freq: Every day | ORAL | 5 refills | Status: AC

## 2023-10-19 MED ORDER — POLYETHYLENE GLYCOL 3350 17 GM/SCOOP PO POWD: 17.0000 g | Freq: Every day | ORAL | 0 refills | Status: AC

## 2023-10-19 NOTE — Progress Notes (Unsigned)
 [facility] New Patient Evaluation and Consultation  Referring Provider: Henry Slough, MD PCP: Dwight Trula SQUIBB, MD Date of Service: 10/19/2023  SUBJECTIVE Chief Complaint: New Patient (Initial Visit) ([name] is a 89 y.o. female is here for OAB.)  History of Present Illness: [name] is a 89 y.o. White or Caucasian female seen in consultation at the request of Dr. Henry for evaluation of overactive bladder.  Patient presents with her daughter.   Review of records significant for: OAB medications  Hx of A&P repair  Urinary Symptoms: Leaks urine with cough/ sneeze, lifting, with urgency, and while asleep Leaks 3-4 time(s) per days.  Pad use: 3 pads per day.   Patient is bothered by UI symptoms.  Day time voids 4.  Nocturia: 3-4 times per night to void. Voiding dysfunction:  empties bladder well.  Patient does not use a catheter to empty bladder.  When urinating, patient feels difficulty starting urine stream Drinks: 32oz water , occasional sips of cola, occasional orange juice per day  UTIs: 1 UTI's in the last year.   Denies history of urologic concerns No results found for the last 90 days.   Pelvic Organ Prolapse Symptoms:                  Patient Denies a feeling of a bulge the vaginal area.   Bowel Symptom: Bowel movements: 1 time(s) per day Stool consistency: hard Straining: yes.  Splinting: yes.  Incomplete evacuation: yes.  Patient Admits to accidental bowel leakage / fecal incontinence  Occurs: 1 time(s) per day  Consistency with leakage: solid Bowel regimen: stool softener Last colonoscopy: Date unknown [facility] Colonoscopy   This patient has no relevant Health Maintenance data.     Sexual Function Sexually active: no.  Sexual orientation: Straight Pain with sex: No  Pelvic Pain Denies pelvic pain    Past Medical History:  Past Medical History:  Diagnosis Date   Abnormal Pap smear 1975-76   Acute deep vein thrombosis (DVT)  of left peroneal vein (HCC)    Anemia    history of   Arthritis    spine   Breast cyst, left 1980   Cystocele 2012   Diverticulosis    with h/o Diverticulitis   DVT, lower extremity, distal, chronic (HCC) 12/24/2018   GERD (gastroesophageal reflux disease)    H/O hypercholesterolemia    H/O osteopenia    H/O varicella    H/O varicose veins    Hx of Mumps    Macular degeneration    Seasonal allergies    TIA (transient ischemic attack)    Urge incontinence 2012   Urinary frequency 2010     Past Surgical History:   Past Surgical History:  Procedure Laterality Date   14-Day Zio Patch  12/2021   Predominantly sinus rhythm open and 47-1:15 PM, average 70 bpm (1  AVB); rare isolated PACs PVCs.  55 atrial runs 4-28 beats fastest heart rate ranges 169 to 176 bpm.  Episodes lasting 1.8 to 15 seconds.  No sustained arrhythmias.  Symptoms not noted with atrial runs.   ABDOMINAL HYSTERECTOMY     BLADDER SUSPENSION N/A 06/16/2016   Procedure: TRANSVAGINAL TAPE (TVT) PROCEDURE;  Surgeon: Henry Slough, MD;  Location: [facility];  Service: Gynecology;  Laterality: N/A;   BREAST CYST ASPIRATION  1964   COLONOSCOPY     Dr. Luis   CT CTA CORONARY W/CA SCORE W/CM &/OR WO/CM  11/2021   1. Mild mixed non-obstructive CAD, CADRADS =  2.   2. Coronary calcium  score of 255. This was 73rd percentile for age and sex matched control (based on an 89 yo).  3. Normal coronary origin with right dominance.   4. Aortic and mitral annular calcification.  5. Aortic atherosclerosis   CYSTOCELE REPAIR N/A 06/16/2016   Procedure: ANTERIOR REPAIR (CYSTOCELE);  Surgeon: Henry Slough, MD;  Location: [facility];  Service: Gynecology;  Laterality: N/A;   CYSTOSCOPY N/A 06/16/2016   Procedure: CYSTOSCOPY;  Surgeon: Henry Slough, MD;  Location: [facility];  Service: Gynecology;  Laterality: N/A;  see anterior repair   Lower Extremity Venous Dopplers  01/09/2012   Right and left lower steroids: No evidence of thrombus or,  thrombophlebitis; right and left GSV and SSV: No venous insufficiency. Normal exam.   NM MYOVIEW  LTD  11/2016   6.6 METS.  Reached 106% of max.  Heart rate.  Walk for 4:40 min.  EF 72%.  Normal blood pressure response.  upsloping ST segment depression, nonspecific.  Otherwise normal study.  LOW RISK.  No evidence of ischemia or infarction.   TRANSTHORACIC ECHOCARDIOGRAM  11/2021   Normal LV Size & Fxn - EF 60-65%. No RWMA. ~ Diastolic Fxn. Degenerative MV - Mod MAC. ~ Mod AS     Past OB/GYN History: [id number] Menopausal: Yes, at age 20 Contraception: n/a. Last pap smear was unknown [facility] PAP   This patient has no relevant Health Maintenance data.     Medications: Patient has a current medication list which includes the following prescription(s): acetaminophen , albuterol , alendronate , vitamin d -3, estradiol , culturelle, magnesium , centrum silver ultra womens, multiple vitamins-minerals, (feeding supplement) prosource plus, polyethylene glycol powder, polyvinyl alcohol-povidone, protein supplement shake, spiriva  respimat, vibegron, vitamin b-12, and midodrine .   Allergies: Patient is allergic to atorvastatin , desmopressin  acetate, iodinated contrast media, myrbetriq  [mirabegron ], and iodine.   Social History:  Social History   Tobacco Use   Smoking status: Never    Passive exposure: Never   Smokeless tobacco: Never  Vaping Use   Vaping status: Never Used  Substance Use Topics   Alcohol use: No   Drug use: No    Relationship status: married Patient lives with husband.   Patient is not employed . Regular exercise: No History of abuse: No  Family History:   Family History  Problem Relation Age of Onset   Uterine cancer Mother 61   Heart attack Brother 78   Anuerysm Brother    Kidney disease Brother    Breast cancer Daughter    Stroke Neg Hx      Review of Systems: Review of Systems  Constitutional:  Positive for weight loss. Negative for chills and fever.  Respiratory:   Positive for shortness of breath. Negative for cough.   Cardiovascular:  Negative for chest pain and palpitations.  Gastrointestinal:  Negative for abdominal pain, blood in stool, constipation and diarrhea.  Skin:  Negative for rash.  Neurological:  Positive for dizziness. Negative for weakness.  Endo/Heme/Allergies:  Bruises/bleeds easily.  Psychiatric/Behavioral:  Negative for depression and suicidal ideas.      OBJECTIVE Physical Exam: Vitals:   10/19/23 1410  BP: (!) 160/77  Pulse: 81  Weight: 114 lb 6.4 oz (51.9 kg)  Height: 5' 1.7 (1.567 m)    Physical Exam Constitutional:      Appearance: Normal appearance.  Pulmonary:     Effort: Pulmonary effort is normal.  Abdominal:     Palpations: Abdomen is soft.  Neurological:     General: No focal deficit present.  Mental Status: She is alert and oriented to person, place, and time.  Psychiatric:        Mood and Affect: Mood normal.        Behavior: Behavior normal. Behavior is cooperative.        Thought Content: Thought content normal.      GU / Detailed Urogynecologic Evaluation:  Pelvic Exam: Normal external female genitalia; Bartholin's and Skene's glands normal in appearance; urethral meatus normal in appearance, no urethral masses or discharge.   CST: negative   s/p hysterectomy: Speculum exam reveals normal vaginal mucosa with  atrophy and normal vaginal cuff.  Adnexa normal adnexa.    With apex supported, anterior compartment defect was reduced  Pelvic floor strength I/V  Pelvic floor musculature: Right levator tender, Right obturator tender, Left levator non-tender, Left obturator tender  POP-Q:   POP-Q  -2                                            Aa   -2                                           Ba  -3                                              C   3                                            Gh  4                                            Pb  6                                             tvl   -3                                            Ap  -3                                            Bp                                                 D      Rectal Exam:  Normal external exam  Post-Void Residual (PVR) by Straight Caht:  Patient was catheterized to establish PVR. PVR of 50ml was noted  Laboratory Results: Lab Results  Component Value Date   COLORU yellow 10/19/2023   CLARITYU cloudy (A) 10/19/2023   GLUCOSEUR negative 10/19/2023   BILIRUBINUR NEGATIVE 10/19/2023   KETONESU neg 08/24/2011   SPECGRAV 1.015 10/19/2023   RBCUR negative 10/19/2023   PHUR 7.0 10/19/2023   PROTEINUR NEGATIVE 10/19/2023   UROBILINOGEN 0.2 10/19/2023   LEUKOCYTESUR MODERATE (A) 10/19/2023    Lab Results  Component Value Date   CREATININE 1.19 (H) 01/17/2023   CREATININE 1.42 (H) 11/28/2022   CREATININE 1.18 (H) 11/08/2022    Lab Results  Component Value Date   HGBA1C 5.7 (H) 04/05/2021    Lab Results  Component Value Date   HGB 11.6 (L) 01/17/2023     ASSESSMENT AND PLAN Ms. Haubner is a 89 y.o. with:  1. OAB (overactive bladder)   2. Mixed urge and stress incontinence   3. Vaginal atrophy   4. Slow transit constipation   5. Urinary frequency   6. Leukocytes in urine   7. Hematuria, unspecified type    We discussed the symptoms of overactive bladder (OAB), which include urinary urgency, urinary frequency, nocturia, with or without urge incontinence.  While we do not know the exact etiology of OAB, several treatment options exist. We discussed management including behavioral therapy (decreasing bladder irritants, urge suppression strategies, timed voids, bladder retraining), physical therapy, medication; for refractory cases posterior tibial nerve stimulation, sacral neuromodulation, and intravesical botulinum toxin injection. For anticholinergic medications, we discussed the potential side effects of anticholinergics including dry eyes, dry mouth,  constipation, cognitive impairment and urinary retention. For Beta-3 agonist medication, we discussed the potential side effect of elevated blood pressure which is more likely to occur in individuals with uncontrolled hypertension. Patient has tried Myrbetriq , Trospium , and is not a candidate for other anticholinergics. Have sent in a prescription for Gemtesa 75mg  daily as this is one of the last medications we would typically try at her age. Pateint is not interested in botox or PTNS. She may be open to SNM if needed but would need to discuss this further with a surgeon.  Patient has vaginal atrophy on exam. She would benefit from estrogen cream. Patient to use a blueberry sized amount into the vagina. She may use this nightly for 2 weeks and then twice weekly after. We discussed using her finger instead of using the applicator.  Encouraged her to use Metamucil for stool bulking and miralax  for smoother movement and to pull more water  into her stool as well as focus on a mild increase in water  intake.  Will send urine for culture to rule out UTI.  Will send urine for micro.   Patient to follow up in 4-6 weeks for medication check and further discussion of SNM if warranted.    Filipe Greathouse G Mordche Hedglin, NP

## 2023-10-19 NOTE — Patient Instructions (Signed)
 Please start estrogen cream daily for the vaginal atrophy.   Please start Gemtesa 75mg  daily. This is to see if it gives you more control of your bladder.   You can consider if the SNM is an option for you.   Please use miralax  daily with your fiber and drink 4-6 glasses of water  daily.

## 2023-10-21 LAB — URINE CULTURE: Culture: >=100000 — AB

## 2023-10-24 ENCOUNTER — Ambulatory Visit: Payer: Self-pay | Admitting: Obstetrics and Gynecology

## 2023-10-24 ENCOUNTER — Encounter (INDEPENDENT_AMBULATORY_CARE_PROVIDER_SITE_OTHER): Admitting: Ophthalmology

## 2023-10-24 DIAGNOSIS — I10 Essential (primary) hypertension: Secondary | ICD-10-CM

## 2023-10-24 DIAGNOSIS — H35033 Hypertensive retinopathy, bilateral: Secondary | ICD-10-CM

## 2023-10-24 DIAGNOSIS — H43813 Vitreous degeneration, bilateral: Secondary | ICD-10-CM | POA: Diagnosis not present

## 2023-10-24 DIAGNOSIS — H353231 Exudative age-related macular degeneration, bilateral, with active choroidal neovascularization: Secondary | ICD-10-CM

## 2023-10-24 DIAGNOSIS — R82998 Other abnormal findings in urine: Secondary | ICD-10-CM

## 2023-10-24 DIAGNOSIS — D3132 Benign neoplasm of left choroid: Secondary | ICD-10-CM

## 2023-10-24 MED ORDER — SULFAMETHOXAZOLE-TRIMETHOPRIM 800-160 MG PO TABS: 1.0000 | ORAL_TABLET | Freq: Two times a day (BID) | ORAL | 0 refills | Status: AC

## 2023-10-27 NOTE — Telephone Encounter (Signed)
 Patient has been notified and has picked up her antibiotics and will start them today.

## 2023-11-01 ENCOUNTER — Other Ambulatory Visit: Payer: Self-pay

## 2023-11-01 ENCOUNTER — Emergency Department (HOSPITAL_COMMUNITY)

## 2023-11-01 ENCOUNTER — Emergency Department (HOSPITAL_COMMUNITY)
Admission: EM | Admit: 2023-11-01 | Discharge: 2023-11-01 | Disposition: A | Discharge status: Home / Self Care | Attending: Emergency Medicine | Admitting: Emergency Medicine

## 2023-11-01 DIAGNOSIS — I959 Hypotension, unspecified: Secondary | ICD-10-CM | POA: Diagnosis not present

## 2023-11-01 DIAGNOSIS — I350 Nonrheumatic aortic (valve) stenosis: Secondary | ICD-10-CM | POA: Insufficient documentation

## 2023-11-01 DIAGNOSIS — I700 Atherosclerosis of aorta: Secondary | ICD-10-CM | POA: Diagnosis not present

## 2023-11-01 DIAGNOSIS — R55 Syncope and collapse: Secondary | ICD-10-CM | POA: Diagnosis not present

## 2023-11-01 DIAGNOSIS — R42 Dizziness and giddiness: Secondary | ICD-10-CM | POA: Diagnosis not present

## 2023-11-01 DIAGNOSIS — I4519 Other right bundle-branch block: Secondary | ICD-10-CM | POA: Diagnosis not present

## 2023-11-01 DIAGNOSIS — G9089 Other disorders of autonomic nervous system: Secondary | ICD-10-CM | POA: Diagnosis not present

## 2023-11-01 DIAGNOSIS — I9589 Other hypotension: Secondary | ICD-10-CM | POA: Insufficient documentation

## 2023-11-01 DIAGNOSIS — I951 Orthostatic hypotension: Secondary | ICD-10-CM | POA: Diagnosis not present

## 2023-11-01 DIAGNOSIS — R0602 Shortness of breath: Secondary | ICD-10-CM | POA: Diagnosis not present

## 2023-11-01 LAB — BASIC METABOLIC PANEL WITH GFR
Anion gap: 10 (ref 5–15)
BUN: 28 mg/dL — ABNORMAL HIGH (ref 8–23)
CO2: 24 mmol/L (ref 22–32)
Calcium: 9.3 mg/dL (ref 8.9–10.3)
Chloride: 105 mmol/L (ref 98–111)
Creatinine, Ser: 1.56 mg/dL — ABNORMAL HIGH (ref 0.44–1.00)
GFR, Estimated: 30 mL/min — ABNORMAL LOW (ref >60)
Glucose, Bld: 82 mg/dL (ref 70–99)
Potassium: 4.2 mmol/L (ref 3.5–5.1)
Sodium: 139 mmol/L (ref 135–145)

## 2023-11-01 LAB — TROPONIN T, HIGH SENSITIVITY: Troponin T High Sensitivity: 30 ng/L — ABNORMAL HIGH (ref 0–19)

## 2023-11-01 LAB — URINALYSIS, ROUTINE W REFLEX MICROSCOPIC
Bilirubin Urine: NEGATIVE
Glucose, UA: NEGATIVE mg/dL
Hgb urine dipstick: NEGATIVE
Ketones, ur: NEGATIVE mg/dL
Leukocytes,Ua: NEGATIVE
Nitrite: NEGATIVE
Protein, ur: NEGATIVE mg/dL
Specific Gravity, Urine: 1.005 (ref 1.005–1.030)
pH: 7 (ref 5.0–8.0)

## 2023-11-01 LAB — CBC
HCT: 30 % — ABNORMAL LOW (ref 36.0–46.0)
Hemoglobin: 10.5 g/dL — ABNORMAL LOW (ref 12.0–15.0)
MCH: 29.7 pg (ref 26.0–34.0)
MCHC: 35 g/dL (ref 30.0–36.0)
MCV: 85 fL (ref 80.0–100.0)
Platelets: 191 K/uL (ref 150–400)
RBC: 3.53 MIL/uL — ABNORMAL LOW (ref 3.87–5.11)
RDW: 13.5 % (ref 11.5–15.5)
WBC: 7.5 K/uL (ref 4.0–10.5)
nRBC: 0 % (ref 0.0–0.2)

## 2023-11-01 LAB — RESP PANEL BY RT-PCR (RSV, FLU A&B, COVID)  RVPGX2
Influenza A by PCR: NEGATIVE
Influenza B by PCR: NEGATIVE
Resp Syncytial Virus by PCR: NEGATIVE
SARS Coronavirus 2 by RT PCR: NEGATIVE

## 2023-11-01 MED ORDER — MIDODRINE HCL 5 MG PO TABS: 5.0000 mg | ORAL_TABLET | Freq: Three times a day (TID) | ORAL | 0 refills | Status: AC | PRN

## 2023-11-01 MED ORDER — SODIUM CHLORIDE 0.9 % IV BOLUS
1000.0000 mL | Freq: Once | INTRAVENOUS | Status: AC
  Administered 2023-11-01: 1000 mL via INTRAVENOUS

## 2023-11-01 NOTE — ED Notes (Signed)
 Patient d.c with home care instructions. VS obtained. IV discontinued. Patient assisted with getting dressed, getting in wheel chair and pushed to the front lobby to meet her son .

## 2023-11-01 NOTE — ED Triage Notes (Signed)
 Patient brought in by [facility]. Patient called due to being dizzy. Patient says BP was 80/70. She called EMS who noted her Sitting BP to 124/68 sitting and similar when standing.  [facility] reports that patient finished a course of antibiotics for a UTI that was completed on Sunday.  Patient arrived alert and oriented.

## 2023-11-01 NOTE — ED Provider Notes (Signed)
 [facility] AT [facility] Provider Note   CSN: [id number] Arrival date & time: 11/01/23  1644     Patient presents with: Dizziness   [name] is a 89 y.o. female   Presenting from home with episode of near syncope.  Patient ports that she woke up with dizziness and vertigo in the morning which is quite typical for her.  However, sometime after lunch, she began to feel very lightheaded and dizzy more so than normal.  She says she checked her blood pressure twice and was quite low with 80 systolic.  EMS was called to the scene who noted her sitting blood pressure to be 124/68 and standing similar.  She feels much better here in the ED  Patient reports completing course of antibiotics for UTI on Sunday, likely with Bactrim  for 3 days.  She says she was also started on a medicine the next BP a lot.  Patient follows with cardiology, Dr. Anner.  Per his office note from July of this year, she is noted to have moderate aortic stenosis, also noted to have dysautonomia with orthostatic hypotension syndrome, for which she was discontinued off of metoprolol .  She uses midodrine  as needed for dizziness.   HPI     Prior to Admission medications   Medication Sig Start Date End Date Taking? Authorizing Provider  midodrine  (PROAMATINE ) 5 MG tablet Take 1 tablet (5 mg total) by mouth 3 (three) times daily as needed for up to 21 days. 11/01/23 11/22/23 Yes Liylah Najarro, Donnice PARAS, MD  acetaminophen  (TYLENOL ) 500 MG tablet Take 500-1,000 mg by mouth every 8 (eight) hours as needed for mild pain.     [provider]  albuterol  (VENTOLIN  HFA) 108 (90 Base) MCG/ACT inhaler Inhale 2 puffs into the lungs every 4 (four) hours as needed for wheezing or shortness of breath. 11/07/22   Hunsucker, Donnice SAUNDERS, MD  alendronate  (FOSAMAX ) 70 MG tablet Take 1 tablet by mouth once a week.    [provider]  Cholecalciferol  (VITAMIN D -3) 25 MCG (1000 UT) CAPS Take 1,000  Units by mouth daily with breakfast.     [provider]  estradiol  (ESTRACE ) 0.1 MG/GM vaginal cream Place 0.5 g vaginally 2 (two) times a week. Place 0.5g nightly for two weeks then twice a week after 10/19/23   Zuleta, Kaitlin G, NP  Lactobacillus Rhamnosus, GG, (CULTURELLE) CAPS Take 1 capsule by mouth every morning.     [provider]  Magnesium  250 MG TABS Take 250 mg by mouth daily as needed (leg cramps).    [provider]  midodrine  (PROAMATINE ) 2.5 MG tablet Take 1 tablet (2.5 mg total) by mouth as needed. If your  are dizzy or blood pressure low Patient not taking: Reported on 10/19/2023 05/22/23   Anner Alm ORN, MD  Multiple Vitamins-Minerals (CENTRUM SILVER ULTRA WOMENS) TABS Take 1 tablet by mouth daily with breakfast.    [provider]  Multiple Vitamins-Minerals (ICAPS AREDS 2 PO) Take 1 capsule by mouth daily at 12 noon.    [provider]  Nutritional Supplements (,FEEDING SUPPLEMENT, PROSOURCE PLUS) liquid Take 30 mLs by mouth 2 (two) times daily between meals. 10/22/21   Patel, Pranav M, MD  Polyvinyl Alcohol-Povidone (REFRESH OP) Place 1 drop into both eyes daily as needed (dry eyes).    [provider]  protein supplement shake (PREMIER PROTEIN) LIQD Take 59.1 mLs (2 oz total) by mouth 3 (three) times daily between meals. 10/22/21  Tobie Yetta HERO, MD  Tiotropium Bromide Monohydrate  (SPIRIVA  RESPIMAT) 1.25 MCG/ACT AERS Inhale 2 puffs into the lungs daily. Patient taking differently: Inhale 2 puffs into the lungs as needed. 05/12/22   Gladis Leonor HERO, MD  Vibegron 75 MG TABS Take 1 tablet (75 mg total) by mouth daily. 10/19/23   Zuleta, Kaitlin G, NP  vitamin B-12 (CYANOCOBALAMIN ) 100 MCG tablet Take 100 mcg by mouth daily.    [provider]    Allergies: Atorvastatin , Desmopressin  acetate, Iodinated contrast media, Myrbetriq  [mirabegron ], and Iodine    Review of Systems  Updated Vital Signs BP (!) 146/73    Pulse 68   Temp 97.8 F (36.6 C)   Resp 13   Ht 5' 2 (1.575 m)   SpO2 100%   BMI 20.92 kg/m   Physical Exam Constitutional:      General: She is not in acute distress. HENT:     Head: Normocephalic and atraumatic.  Eyes:     Conjunctiva/sclera: Conjunctivae normal.     Pupils: Pupils are equal, round, and reactive to light.  Cardiovascular:     Rate and Rhythm: Normal rate and regular rhythm.     Heart sounds: Murmur heard.  Pulmonary:     Effort: Pulmonary effort is normal. No respiratory distress.  Abdominal:     General: There is no distension.     Tenderness: There is no abdominal tenderness.  Skin:    General: Skin is warm and dry.  Neurological:     General: No focal deficit present.     Mental Status: She is alert. Mental status is at baseline.  Psychiatric:        Mood and Affect: Mood normal.        Behavior: Behavior normal.     (all labs ordered are listed, but only abnormal results are displayed) Labs Reviewed  BASIC METABOLIC PANEL WITH GFR - Abnormal; Notable for the following components:      Result Value   BUN 28 (*)    Creatinine, Ser 1.56 (*)    GFR, Estimated 30 (*)    All other components within normal limits  CBC - Abnormal; Notable for the following components:   RBC 3.53 (*)    Hemoglobin 10.5 (*)    HCT 30.0 (*)    All other components within normal limits  TROPONIN T, HIGH SENSITIVITY - Abnormal; Notable for the following components:   Troponin T High Sensitivity 30 (*)    All other components within normal limits  RESP PANEL BY RT-PCR (RSV, FLU A&B, COVID)  RVPGX2  URINALYSIS, ROUTINE W REFLEX MICROSCOPIC    EKG: EKG Interpretation Date/Time:  Wednesday November 01 2023 17:26:55 EDT Ventricular Rate:  70 PR Interval:  172 QRS Duration:  114 QT Interval:  413 QTC Calculation: 446 R Axis:   -20  Text Interpretation: Sinus rhythm Probable left atrial enlargement Incomplete right bundle branch block Confirmed by Cottie Cough  [phone]) on 11/01/2023 5:36:26 PM  Radiology: No results found.    Procedures   Medications Ordered in the ED  sodium chloride  0.9 % bolus 1,000 mL (0 mLs Intravenous Stopped 11/01/23 1900)                                    Medical Decision Making Amount and/or Complexity of Data Reviewed Labs: ordered. Radiology: ordered. ECG/medicine tests: ordered.  Risk Prescription drug management.   This patient  presents to the ED with concern for dizziness, low blood pressure, near syncope. This involves an extensive number of treatment options, and is a complaint that carries with it a high risk of complications and morbidity.  The differential diagnosis includes anomia versus symptomatic aortic stenosis versus atypical ACS versus anemia versus arrhythmia versus other  Co-morbidities that complicate the patient evaluation: Advanced age and known cardiac disease and history as noted above; history of dysautonomia, at risk of low blood pressure  Additional history obtained from patient's family at the bedside, EMS  External records from outside source obtained and reviewed including cardiology record from July of this year  I ordered and personally interpreted labs.  The pertinent results include: No emergent findings.  Very mild elevation of troponin in setting of chronic kidney disease, doubt ACS, no chest pain  I ordered imaging studies including x-ray of the chest I independently visualized and interpreted imaging which showed no emergent finding I agree with the radiologist interpretation  The patient was maintained on a cardiac monitor.  I personally viewed and interpreted the cardiac monitored which showed an underlying rhythm of: regular HR  Per my interpretation the patient's ECG shows no acute ischemia  I ordered medication including IV fluids for hydration/hypotension  I have reviewed the patients home medicines and have made adjustments as needed  Test Considered:  Suspicion for sepsis, acute PE  After the interventions noted above, I reevaluated the patient and found that they have: improved  I suspect this was related to her known dysautonomia.  I advised walker at home, and we can restart her on PRN midodrine , which she was on in the past, according to her cardiology notes, if she notes her BP is dropping at home. She's quite good at monitoring this.   She wants to go home otherwise, and appears to be stable and asymptomatic to go.  Dispostion:  After consideration of the diagnostic results and the patients response to treatment, I feel that the patent would benefit from outpatient follow up      Final diagnoses:  Transient hypotension    ED Discharge Orders          Ordered    midodrine  (PROAMATINE ) 5 MG tablet  3 times daily PRN        11/01/23 2104               Cottie Donnice PARAS, MD 11/04/23 1354

## 2023-11-21 ENCOUNTER — Encounter (INDEPENDENT_AMBULATORY_CARE_PROVIDER_SITE_OTHER): Admitting: Ophthalmology

## 2023-11-21 DIAGNOSIS — H35033 Hypertensive retinopathy, bilateral: Secondary | ICD-10-CM

## 2023-11-21 DIAGNOSIS — D3132 Benign neoplasm of left choroid: Secondary | ICD-10-CM | POA: Diagnosis not present

## 2023-11-21 DIAGNOSIS — H353231 Exudative age-related macular degeneration, bilateral, with active choroidal neovascularization: Secondary | ICD-10-CM

## 2023-11-21 DIAGNOSIS — H43813 Vitreous degeneration, bilateral: Secondary | ICD-10-CM | POA: Diagnosis not present

## 2023-11-21 DIAGNOSIS — I10 Essential (primary) hypertension: Secondary | ICD-10-CM

## 2023-12-07 ENCOUNTER — Ambulatory Visit: Admitting: Obstetrics and Gynecology

## 2023-12-07 ENCOUNTER — Encounter: Payer: Self-pay | Admitting: Obstetrics and Gynecology

## 2023-12-07 VITALS — BP 118/66 | HR 99

## 2023-12-07 DIAGNOSIS — K5901 Slow transit constipation: Secondary | ICD-10-CM | POA: Diagnosis not present

## 2023-12-07 DIAGNOSIS — N3281 Overactive bladder: Secondary | ICD-10-CM

## 2023-12-07 DIAGNOSIS — N3946 Mixed incontinence: Secondary | ICD-10-CM

## 2023-12-07 NOTE — Progress Notes (Signed)
 [facility] Return Visit  SUBJECTIVE  History of Present Illness: [name] is a 89 y.o. female seen in follow-up for OAB. Plan at last visit was start Gemtesa 75mg  daily.   Patient reports she felt the Gemtesa 75mg  made her worse. She states it made her go more frequently and made her feel that she would sit on the toilet forever and not stop going to the bathroom. She also reports she got sick and went to the hospital and was told she was dehydrated. She stopped the Gemtesa 75mg  after two weeks.     Past Medical History: Patient  has a past medical history of Abnormal Pap smear (1975-76), Acute deep vein thrombosis (DVT) of left peroneal vein (HCC), Anemia, Arthritis, Breast cyst, left (1980), Cystocele (2012), Diverticulosis, DVT, lower extremity, distal, chronic (HCC) (12/24/2018), GERD (gastroesophageal reflux disease), H/O hypercholesterolemia, H/O osteopenia, H/O varicella, H/O varicose veins, Hx of Mumps, Macular degeneration, Seasonal allergies, TIA (transient ischemic attack), Urge incontinence (2012), and Urinary frequency (2010).   Past Surgical History: She  has a past surgical history that includes Abdominal hysterectomy; NM MYOVIEW  LTD (11/2016); transthoracic echocardiogram (11/2021); Lower Extremity Venous Dopplers (01/09/2012); Colonoscopy; Cystocele repair (N/A, 06/16/2016); Bladder suspension (N/A, 06/16/2016); Cystoscopy (N/A, 06/16/2016); Breast cyst aspiration (1964); CT CTA CORONARY W/CA SCORE W/CM &/OR WO/CM (11/2021); and 14-Day Zio Patch (12/2021).   Medications: She has a current medication list which includes the following prescription(s): acetaminophen , albuterol , alendronate , vitamin d -3, estradiol , culturelle, magnesium , midodrine , centrum silver ultra womens, multiple vitamins-minerals, (feeding supplement) prosource plus, polyvinyl alcohol-povidone, protein supplement shake, spiriva  respimat, vibegron, and vitamin b-12.   Allergies: Patient  is allergic to atorvastatin , desmopressin  acetate, iodinated contrast media, myrbetriq  [mirabegron ], and iodine.   Social History: Patient  reports that she has never smoked. She has never been exposed to tobacco smoke. She has never used smokeless tobacco. She reports that she does not drink alcohol and does not use drugs.     OBJECTIVE     Physical Exam: Vitals:   12/07/23 1502  BP: 118/66  Pulse: 99   Gen: No apparent distress, A&O x 3.  Detailed Urogynecologic Evaluation:  External vaginal exam unremarkable.  Attempted to perform a simple CMG. Patient was straight cathed for . When trying to perform Simple CMG patient was unable to hold more than in the bladder without terminal DO. The DO caused her to urinate around the catheter and overflow the syringe. Stress incontinence was demonstrated with cough.    ASSESSMENT AND PLAN    [name] is a 89 y.o. with:  1. OAB (overactive bladder)   2. Mixed urge and stress incontinence   3. Slow transit constipation    Patient has tried and failed Myrbetriq , Gemtesa, and Trospium . The only other options at this point are tertiary therapy options, including Bladder botox, SNM, and PTNS. Patient is adamant she does not wish to do botox due to the risk of retention which is reasonable. She reports she does not want to undergo a surgical procedure with SNM and states she cannot devote the time to doing PTNS as she has to be with her husband who has dementia.  Patient has +SUI on CST, but I do not think bulking would be helpful enough with her having terminal DO's. We considered the pessary as an option, but patient's Gh and TVL are short, and I think we would be doing more irritation than help.  Will send referral to PT. She reports it has been over  10 years since she did pelvic floor PT. We can see if this is beneficial. After some PT can have her follow up with one of the surgeons to discuss other options further if necessary.  I  called and spoke to patient's daughter Shona while patient was with me in the room. I discussed all the options that her mother and I had gone through and her feelings about them. She is aware that we will plan for PT at this time.      Kingstyn Deruiter G Dmitry Macomber, NP

## 2023-12-20 DIAGNOSIS — I951 Orthostatic hypotension: Secondary | ICD-10-CM | POA: Diagnosis not present

## 2023-12-20 DIAGNOSIS — Z1331 Encounter for screening for depression: Secondary | ICD-10-CM | POA: Diagnosis not present

## 2023-12-20 DIAGNOSIS — N1831 Chronic kidney disease, stage 3a: Secondary | ICD-10-CM | POA: Diagnosis not present

## 2023-12-20 DIAGNOSIS — R1319 Other dysphagia: Secondary | ICD-10-CM | POA: Diagnosis not present

## 2023-12-20 DIAGNOSIS — Z0000 Encounter for general adult medical examination without abnormal findings: Secondary | ICD-10-CM | POA: Diagnosis not present

## 2023-12-20 DIAGNOSIS — Z8673 Personal history of transient ischemic attack (TIA), and cerebral infarction without residual deficits: Secondary | ICD-10-CM | POA: Diagnosis not present

## 2023-12-20 DIAGNOSIS — D649 Anemia, unspecified: Secondary | ICD-10-CM | POA: Diagnosis not present

## 2023-12-20 DIAGNOSIS — N3281 Overactive bladder: Secondary | ICD-10-CM | POA: Diagnosis not present

## 2023-12-20 DIAGNOSIS — I350 Nonrheumatic aortic (valve) stenosis: Secondary | ICD-10-CM | POA: Diagnosis not present

## 2023-12-20 DIAGNOSIS — I5030 Unspecified diastolic (congestive) heart failure: Secondary | ICD-10-CM | POA: Diagnosis not present

## 2023-12-20 DIAGNOSIS — R7301 Impaired fasting glucose: Secondary | ICD-10-CM | POA: Diagnosis not present

## 2023-12-21 ENCOUNTER — Encounter (INDEPENDENT_AMBULATORY_CARE_PROVIDER_SITE_OTHER): Admitting: Ophthalmology

## 2023-12-21 DIAGNOSIS — H43813 Vitreous degeneration, bilateral: Secondary | ICD-10-CM

## 2023-12-21 DIAGNOSIS — H353231 Exudative age-related macular degeneration, bilateral, with active choroidal neovascularization: Secondary | ICD-10-CM

## 2023-12-21 DIAGNOSIS — H35033 Hypertensive retinopathy, bilateral: Secondary | ICD-10-CM

## 2023-12-21 DIAGNOSIS — D3132 Benign neoplasm of left choroid: Secondary | ICD-10-CM

## 2023-12-21 DIAGNOSIS — I10 Essential (primary) hypertension: Secondary | ICD-10-CM

## 2024-01-05 ENCOUNTER — Encounter: Payer: Self-pay | Admitting: Cardiology

## 2024-01-24 ENCOUNTER — Encounter (INDEPENDENT_AMBULATORY_CARE_PROVIDER_SITE_OTHER): Admitting: Ophthalmology

## 2024-01-24 DIAGNOSIS — D3132 Benign neoplasm of left choroid: Secondary | ICD-10-CM | POA: Diagnosis not present

## 2024-01-24 DIAGNOSIS — H43813 Vitreous degeneration, bilateral: Secondary | ICD-10-CM

## 2024-01-24 DIAGNOSIS — H353231 Exudative age-related macular degeneration, bilateral, with active choroidal neovascularization: Secondary | ICD-10-CM | POA: Diagnosis not present

## 2024-01-24 DIAGNOSIS — H35033 Hypertensive retinopathy, bilateral: Secondary | ICD-10-CM | POA: Diagnosis not present

## 2024-01-24 DIAGNOSIS — I10 Essential (primary) hypertension: Secondary | ICD-10-CM

## 2024-02-28 ENCOUNTER — Encounter (INDEPENDENT_AMBULATORY_CARE_PROVIDER_SITE_OTHER): Admitting: Ophthalmology

## 2024-03-19 ENCOUNTER — Ambulatory Visit: Admitting: Physical Therapy
